# Patient Record
Sex: Male | Born: 1945
Health system: Southern US, Community
[De-identification: ages and names within clinical notes are randomized; demographics above are authoritative.]

## PROBLEM LIST (undated history)

## (undated) DIAGNOSIS — R972 Elevated prostate specific antigen [PSA]: Secondary | ICD-10-CM

## (undated) DIAGNOSIS — M1990 Unspecified osteoarthritis, unspecified site: Secondary | ICD-10-CM

## (undated) DIAGNOSIS — Z87442 Personal history of urinary calculi: Secondary | ICD-10-CM

## (undated) DIAGNOSIS — C61 Malignant neoplasm of prostate: Secondary | ICD-10-CM

## (undated) DIAGNOSIS — I10 Essential (primary) hypertension: Secondary | ICD-10-CM

## (undated) DIAGNOSIS — R9431 Abnormal electrocardiogram [ECG] [EKG]: Secondary | ICD-10-CM

## (undated) DIAGNOSIS — T50995A Adverse effect of other drugs, medicaments and biological substances, initial encounter: Secondary | ICD-10-CM

## (undated) DIAGNOSIS — R7303 Prediabetes: Secondary | ICD-10-CM

## (undated) DIAGNOSIS — K219 Gastro-esophageal reflux disease without esophagitis: Secondary | ICD-10-CM

## (undated) DIAGNOSIS — T7840XA Allergy, unspecified, initial encounter: Secondary | ICD-10-CM

## (undated) DIAGNOSIS — K5730 Diverticulosis of large intestine without perforation or abscess without bleeding: Secondary | ICD-10-CM

## (undated) DIAGNOSIS — R200 Anesthesia of skin: Secondary | ICD-10-CM

## (undated) DIAGNOSIS — N289 Disorder of kidney and ureter, unspecified: Secondary | ICD-10-CM

## (undated) DIAGNOSIS — C439 Malignant melanoma of skin, unspecified: Secondary | ICD-10-CM

## (undated) DIAGNOSIS — M25469 Effusion, unspecified knee: Secondary | ICD-10-CM

## (undated) DIAGNOSIS — H269 Unspecified cataract: Secondary | ICD-10-CM

## (undated) DIAGNOSIS — E785 Hyperlipidemia, unspecified: Secondary | ICD-10-CM

## (undated) DIAGNOSIS — Z77098 Contact with and (suspected) exposure to other hazardous, chiefly nonmedicinal, chemicals: Secondary | ICD-10-CM

## (undated) HISTORY — PX: VASECTOMY: SHX75

## (undated) HISTORY — DX: Malignant melanoma of skin, unspecified: C43.9

## (undated) HISTORY — DX: Unspecified osteoarthritis, unspecified site: M19.90

## (undated) HISTORY — DX: Hyperlipidemia, unspecified: E78.5

## (undated) HISTORY — DX: Contact with and (suspected) exposure to other hazardous, chiefly nonmedicinal, chemicals: Z77.098

## (undated) HISTORY — DX: Essential (primary) hypertension: I10

## (undated) HISTORY — DX: Elevated prostate specific antigen (PSA): R97.20

## (undated) HISTORY — PX: CATARACT EXTRACTION: SUR2

## (undated) HISTORY — PX: OTHER SURGICAL HISTORY: SHX169

## (undated) HISTORY — DX: Anesthesia of skin: R20.0

## (undated) HISTORY — DX: Disorder of kidney and ureter, unspecified: N28.9

## (undated) HISTORY — DX: Diverticulosis of large intestine without perforation or abscess without bleeding: K57.30

## (undated) HISTORY — DX: Allergy, unspecified, initial encounter: T78.40XA

## (undated) HISTORY — DX: Malignant neoplasm of prostate: C61

## (undated) HISTORY — PX: TONSILLECTOMY: SUR1361

## (undated) HISTORY — DX: Unspecified cataract: H26.9

## (undated) HISTORY — PX: CYSTECTOMY: SUR359

## (undated) HISTORY — PX: PROSTATECTOMY: SHX69

## (undated) HISTORY — DX: Effusion, unspecified knee: M25.469

## (undated) HISTORY — PX: BUNIONECTOMY: SHX129

## (undated) HISTORY — DX: Adverse effect of other drugs, medicaments and biological substances, initial encounter: T50.995A

## (undated) HISTORY — DX: Abnormal electrocardiogram (ECG) (EKG): R94.31

---

## 2001-08-10 ENCOUNTER — Ambulatory Visit (HOSPITAL_COMMUNITY): Admission: RE | Admit: 2001-08-10 | Discharge: 2001-08-10 | Payer: Self-pay | Admitting: Family Medicine

## 2001-08-10 ENCOUNTER — Encounter: Payer: Self-pay | Admitting: Family Medicine

## 2004-04-14 ENCOUNTER — Ambulatory Visit: Payer: Self-pay | Admitting: *Deleted

## 2004-04-22 ENCOUNTER — Ambulatory Visit: Payer: Self-pay | Admitting: *Deleted

## 2004-04-22 ENCOUNTER — Ambulatory Visit (HOSPITAL_COMMUNITY): Admission: RE | Admit: 2004-04-22 | Discharge: 2004-04-22 | Payer: Self-pay | Admitting: *Deleted

## 2004-05-03 ENCOUNTER — Ambulatory Visit: Payer: Self-pay | Admitting: *Deleted

## 2004-05-14 ENCOUNTER — Ambulatory Visit: Payer: Self-pay | Admitting: *Deleted

## 2004-06-07 ENCOUNTER — Ambulatory Visit: Payer: Self-pay | Admitting: *Deleted

## 2004-06-21 ENCOUNTER — Ambulatory Visit (HOSPITAL_COMMUNITY): Admission: RE | Admit: 2004-06-21 | Discharge: 2004-06-21 | Payer: Self-pay | Admitting: Internal Medicine

## 2004-06-21 ENCOUNTER — Ambulatory Visit: Payer: Self-pay | Admitting: Internal Medicine

## 2005-03-07 LAB — HM COLONOSCOPY: HM Colonoscopy: NORMAL

## 2005-09-29 ENCOUNTER — Ambulatory Visit: Payer: Self-pay | Admitting: Cardiology

## 2005-10-31 ENCOUNTER — Ambulatory Visit: Payer: Self-pay | Admitting: Cardiology

## 2006-03-14 ENCOUNTER — Ambulatory Visit: Payer: Self-pay | Admitting: Family Medicine

## 2006-04-25 ENCOUNTER — Ambulatory Visit: Payer: Self-pay | Admitting: Family Medicine

## 2006-04-25 LAB — CONVERTED CEMR LAB
ALT: 19 units/L (ref 0–40)
BUN: 11 mg/dL (ref 6–23)
Bilirubin, Direct: 0.1 mg/dL (ref 0.0–0.3)
CO2: 32 meq/L (ref 19–32)
Cholesterol: 149 mg/dL (ref 0–200)
Creatinine, Ser: 1.1 mg/dL (ref 0.4–1.5)
GFR calc Af Amer: 88 mL/min
GFR calc non Af Amer: 73 mL/min
Glucose, Bld: 102 mg/dL — ABNORMAL HIGH (ref 70–99)
HCT: 40.8 % (ref 39.0–52.0)
HDL: 42.6 mg/dL (ref >39.0)
Hemoglobin: 14 g/dL (ref 13.0–17.0)
LDL Cholesterol: 72 mg/dL (ref 0–99)
MCV: 86.6 fL (ref 78.0–100.0)
Monocytes Absolute: 0.5 10*3/uL (ref 0.2–0.7)
Neutro Abs: 3.2 10*3/uL (ref 1.4–7.7)
Neutrophils Relative %: 54.8 % (ref 43.0–77.0)
PSA: 3.71 ng/mL (ref 0.10–4.00)
Platelets: 219 10*3/uL (ref 150–400)
RBC: 4.71 M/uL (ref 4.22–5.81)
RDW: 13 % (ref 11.5–14.6)
Sodium: 144 meq/L (ref 135–145)
Total CHOL/HDL Ratio: 3.5
Triglycerides: 171 mg/dL — ABNORMAL HIGH (ref 0–149)
VLDL: 34 mg/dL (ref 0–40)

## 2006-05-02 ENCOUNTER — Ambulatory Visit: Payer: Self-pay | Admitting: Family Medicine

## 2006-09-05 ENCOUNTER — Ambulatory Visit: Payer: Self-pay | Admitting: Cardiology

## 2006-09-12 ENCOUNTER — Ambulatory Visit: Payer: Self-pay

## 2006-09-26 ENCOUNTER — Ambulatory Visit: Payer: Self-pay | Admitting: Family Medicine

## 2006-09-26 DIAGNOSIS — E785 Hyperlipidemia, unspecified: Secondary | ICD-10-CM | POA: Insufficient documentation

## 2006-09-26 DIAGNOSIS — T50995A Adverse effect of other drugs, medicaments and biological substances, initial encounter: Secondary | ICD-10-CM

## 2006-09-26 HISTORY — DX: Adverse effect of other drugs, medicaments and biological substances, initial encounter: T50.995A

## 2006-09-26 HISTORY — DX: Hyperlipidemia, unspecified: E78.5

## 2006-09-28 LAB — CONVERTED CEMR LAB
ALT: 20 units/L (ref 0–53)
AST: 21 units/L (ref 0–37)
Alkaline Phosphatase: 44 units/L (ref 39–117)
Bilirubin, Direct: 0.1 mg/dL (ref 0.0–0.3)
Cholesterol: 175 mg/dL (ref 0–200)
Total CHOL/HDL Ratio: 4.9

## 2007-03-19 ENCOUNTER — Encounter: Payer: Self-pay | Admitting: Family Medicine

## 2007-04-13 ENCOUNTER — Ambulatory Visit: Payer: Self-pay | Admitting: Family Medicine

## 2007-04-13 DIAGNOSIS — K5730 Diverticulosis of large intestine without perforation or abscess without bleeding: Secondary | ICD-10-CM

## 2007-04-13 DIAGNOSIS — I10 Essential (primary) hypertension: Secondary | ICD-10-CM

## 2007-04-13 DIAGNOSIS — M1990 Unspecified osteoarthritis, unspecified site: Secondary | ICD-10-CM

## 2007-04-13 HISTORY — DX: Essential (primary) hypertension: I10

## 2007-04-13 HISTORY — DX: Unspecified osteoarthritis, unspecified site: M19.90

## 2007-04-13 HISTORY — DX: Diverticulosis of large intestine without perforation or abscess without bleeding: K57.30

## 2007-04-14 ENCOUNTER — Telehealth: Payer: Self-pay | Admitting: Family Medicine

## 2007-04-14 DIAGNOSIS — R972 Elevated prostate specific antigen [PSA]: Secondary | ICD-10-CM | POA: Insufficient documentation

## 2007-04-14 HISTORY — DX: Elevated prostate specific antigen (PSA): R97.20

## 2007-04-14 LAB — CONVERTED CEMR LAB
Alkaline Phosphatase: 45 units/L (ref 39–117)
Bilirubin, Direct: 0.2 mg/dL (ref 0.0–0.3)
Cholesterol: 172 mg/dL (ref 0–200)
HDL: 39.1 mg/dL (ref >39.0)
PSA: 5.55 ng/mL — ABNORMAL HIGH (ref 0.10–4.00)
Total CHOL/HDL Ratio: 4.4
Total Protein: 7.2 g/dL (ref 6.0–8.3)
Triglycerides: 195 mg/dL — ABNORMAL HIGH (ref 0–149)
VLDL: 39 mg/dL (ref 0–40)

## 2007-04-18 ENCOUNTER — Telehealth (INDEPENDENT_AMBULATORY_CARE_PROVIDER_SITE_OTHER): Payer: Self-pay | Admitting: *Deleted

## 2007-04-24 ENCOUNTER — Encounter: Payer: Self-pay | Admitting: Family Medicine

## 2007-05-16 ENCOUNTER — Ambulatory Visit: Payer: Self-pay | Admitting: Family Medicine

## 2007-05-24 ENCOUNTER — Ambulatory Visit (HOSPITAL_COMMUNITY): Admission: RE | Admit: 2007-05-24 | Discharge: 2007-05-24 | Payer: Self-pay | Admitting: Urology

## 2007-05-29 ENCOUNTER — Encounter: Payer: Self-pay | Admitting: Family Medicine

## 2007-06-29 ENCOUNTER — Telehealth (INDEPENDENT_AMBULATORY_CARE_PROVIDER_SITE_OTHER): Payer: Self-pay | Admitting: *Deleted

## 2007-07-12 ENCOUNTER — Inpatient Hospital Stay (HOSPITAL_COMMUNITY): Admission: RE | Admit: 2007-07-12 | Discharge: 2007-07-13 | Payer: Self-pay | Admitting: Urology

## 2007-07-12 ENCOUNTER — Encounter: Payer: Self-pay | Admitting: Urology

## 2007-07-14 ENCOUNTER — Emergency Department (HOSPITAL_COMMUNITY): Admission: EM | Admit: 2007-07-14 | Discharge: 2007-07-15 | Payer: Self-pay | Admitting: Emergency Medicine

## 2007-08-09 ENCOUNTER — Encounter: Payer: Self-pay | Admitting: Family Medicine

## 2007-09-05 ENCOUNTER — Ambulatory Visit: Payer: Self-pay | Admitting: Cardiology

## 2007-10-24 ENCOUNTER — Ambulatory Visit: Payer: Self-pay | Admitting: Family Medicine

## 2007-10-26 LAB — CONVERTED CEMR LAB
ALT: 20 units/L (ref 0–53)
Cholesterol: 149 mg/dL (ref 0–200)
HDL: 35.8 mg/dL — ABNORMAL LOW (ref >39.0)
Total Bilirubin: 0.6 mg/dL (ref 0.3–1.2)
Total CHOL/HDL Ratio: 4.2
Triglycerides: 165 mg/dL — ABNORMAL HIGH (ref 0–149)
VLDL: 33 mg/dL (ref 0–40)

## 2007-10-31 ENCOUNTER — Ambulatory Visit: Payer: Self-pay | Admitting: Family Medicine

## 2007-11-23 ENCOUNTER — Encounter: Payer: Self-pay | Admitting: Family Medicine

## 2008-02-11 ENCOUNTER — Telehealth: Payer: Self-pay | Admitting: Family Medicine

## 2008-06-19 ENCOUNTER — Encounter: Payer: Self-pay | Admitting: Family Medicine

## 2008-07-08 ENCOUNTER — Ambulatory Visit: Payer: Self-pay | Admitting: Family Medicine

## 2008-07-08 LAB — CONVERTED CEMR LAB
Bilirubin Urine: NEGATIVE
Blood in Urine, dipstick: NEGATIVE
Ketones, urine, test strip: NEGATIVE
Protein, U semiquant: NEGATIVE
Urobilinogen, UA: 0.2

## 2008-07-09 ENCOUNTER — Encounter: Payer: Self-pay | Admitting: Family Medicine

## 2008-07-09 LAB — CONVERTED CEMR LAB
AST: 22 units/L (ref 0–37)
Basophils Absolute: 0.1 10*3/uL (ref 0.0–0.1)
Basophils Relative: 0.7 % (ref 0.0–3.0)
CO2: 27 meq/L (ref 19–32)
Calcium: 9.3 mg/dL (ref 8.4–10.5)
Cholesterol: 131 mg/dL (ref 0–200)
Creatinine, Ser: 1.2 mg/dL (ref 0.4–1.5)
GFR calc non Af Amer: 65.49 mL/min (ref >60)
HDL: 36.2 mg/dL — ABNORMAL LOW (ref >39.00)
LDL Cholesterol: 60 mg/dL (ref 0–99)
Lymphs Abs: 2 10*3/uL (ref 0.7–4.0)
MCV: 88.7 fL (ref 78.0–100.0)
Neutro Abs: 5.6 10*3/uL (ref 1.4–7.7)
Neutrophils Relative %: 66.9 % (ref 43.0–77.0)
PSA: 0.04 ng/mL — ABNORMAL LOW (ref 0.10–4.00)
RBC: 4.51 M/uL (ref 4.22–5.81)
Sodium: 144 meq/L (ref 135–145)
TSH: 2 microintl units/mL (ref 0.35–5.50)
Total Protein: 6.9 g/dL (ref 6.0–8.3)
Triglycerides: 172 mg/dL — ABNORMAL HIGH (ref 0.0–149.0)
VLDL: 34.4 mg/dL (ref 0.0–40.0)
WBC: 8.4 10*3/uL (ref 4.5–10.5)

## 2008-07-15 ENCOUNTER — Ambulatory Visit: Payer: Self-pay | Admitting: Family Medicine

## 2008-07-29 ENCOUNTER — Encounter (INDEPENDENT_AMBULATORY_CARE_PROVIDER_SITE_OTHER): Payer: Self-pay | Admitting: *Deleted

## 2008-10-07 ENCOUNTER — Ambulatory Visit: Payer: Self-pay | Admitting: Cardiology

## 2008-10-07 DIAGNOSIS — R9431 Abnormal electrocardiogram [ECG] [EKG]: Secondary | ICD-10-CM

## 2008-10-07 HISTORY — DX: Abnormal electrocardiogram (ECG) (EKG): R94.31

## 2009-02-11 ENCOUNTER — Ambulatory Visit: Payer: Self-pay | Admitting: Family Medicine

## 2009-02-17 LAB — CONVERTED CEMR LAB
ALT: 17 units/L (ref 0–53)
HDL: 38 mg/dL — ABNORMAL LOW (ref >39.00)
Total Bilirubin: 0.8 mg/dL (ref 0.3–1.2)
Total CHOL/HDL Ratio: 4
Total Protein: 6.8 g/dL (ref 6.0–8.3)
VLDL: 27.4 mg/dL (ref 0.0–40.0)

## 2009-02-18 ENCOUNTER — Ambulatory Visit: Payer: Self-pay | Admitting: Family Medicine

## 2009-06-15 ENCOUNTER — Ambulatory Visit: Payer: Self-pay | Admitting: Family Medicine

## 2009-06-15 DIAGNOSIS — M25469 Effusion, unspecified knee: Secondary | ICD-10-CM

## 2009-06-15 HISTORY — DX: Effusion, unspecified knee: M25.469

## 2009-07-01 ENCOUNTER — Encounter: Payer: Self-pay | Admitting: Family Medicine

## 2009-07-07 ENCOUNTER — Ambulatory Visit: Admission: RE | Admit: 2009-07-07 | Discharge: 2009-09-16 | Payer: Self-pay | Admitting: Radiation Oncology

## 2009-07-08 ENCOUNTER — Encounter: Payer: Self-pay | Admitting: Family Medicine

## 2009-08-05 ENCOUNTER — Encounter: Payer: Self-pay | Admitting: Family Medicine

## 2009-09-16 ENCOUNTER — Encounter: Payer: Self-pay | Admitting: Family Medicine

## 2009-09-28 ENCOUNTER — Ambulatory Visit: Payer: Self-pay | Admitting: Family Medicine

## 2009-09-28 LAB — CONVERTED CEMR LAB
Bilirubin Urine: NEGATIVE
Glucose, Urine, Semiquant: NEGATIVE
Ketones, urine, test strip: NEGATIVE
Specific Gravity, Urine: 1.025
WBC Urine, dipstick: NEGATIVE
pH: 5

## 2009-09-30 LAB — CONVERTED CEMR LAB
BUN: 19 mg/dL (ref 6–23)
Basophils Absolute: 0 10*3/uL (ref 0.0–0.1)
Cholesterol: 163 mg/dL (ref 0–200)
Creatinine, Ser: 1.3 mg/dL (ref 0.4–1.5)
Eosinophils Absolute: 0.1 10*3/uL (ref 0.0–0.7)
Eosinophils Relative: 1.4 % (ref 0.0–5.0)
GFR calc non Af Amer: 61.09 mL/min (ref >60)
HCT: 40.5 % (ref 39.0–52.0)
HDL: 47.1 mg/dL (ref >39.00)
LDL Cholesterol: 81 mg/dL (ref 0–99)
Monocytes Absolute: 0.5 10*3/uL (ref 0.1–1.0)
Monocytes Relative: 9.3 % (ref 3.0–12.0)
Neutro Abs: 3.7 10*3/uL (ref 1.4–7.7)
Platelets: 168 10*3/uL (ref 150.0–400.0)
TSH: 2.48 microintl units/mL (ref 0.35–5.50)
Total Bilirubin: 0.6 mg/dL (ref 0.3–1.2)
Total Protein: 6.7 g/dL (ref 6.0–8.3)
Triglycerides: 176 mg/dL — ABNORMAL HIGH (ref 0.0–149.0)

## 2009-10-05 ENCOUNTER — Ambulatory Visit: Payer: Self-pay | Admitting: Family Medicine

## 2009-10-05 DIAGNOSIS — I2510 Atherosclerotic heart disease of native coronary artery without angina pectoris: Secondary | ICD-10-CM | POA: Insufficient documentation

## 2009-10-05 DIAGNOSIS — C61 Malignant neoplasm of prostate: Secondary | ICD-10-CM | POA: Insufficient documentation

## 2009-10-05 HISTORY — DX: Malignant neoplasm of prostate: C61

## 2009-10-06 ENCOUNTER — Telehealth: Payer: Self-pay | Admitting: Family Medicine

## 2009-10-20 ENCOUNTER — Encounter: Payer: Self-pay | Admitting: Family Medicine

## 2009-12-28 ENCOUNTER — Encounter: Payer: Self-pay | Admitting: Family Medicine

## 2010-03-07 HISTORY — PX: MELANOMA EXCISION: SHX5266

## 2010-03-15 ENCOUNTER — Encounter: Payer: Self-pay | Admitting: Family Medicine

## 2010-04-01 ENCOUNTER — Other Ambulatory Visit: Payer: Self-pay | Admitting: Family Medicine

## 2010-04-01 ENCOUNTER — Ambulatory Visit
Admission: RE | Admit: 2010-04-01 | Discharge: 2010-04-01 | Payer: Self-pay | Source: Home / Self Care | Attending: Family Medicine | Admitting: Family Medicine

## 2010-04-01 LAB — HEPATIC FUNCTION PANEL
AST: 21 U/L (ref 0–37)
Albumin: 4.4 g/dL (ref 3.5–5.2)
Alkaline Phosphatase: 53 U/L (ref 39–117)
Bilirubin, Direct: 0.1 mg/dL (ref 0.0–0.3)
Total Bilirubin: 0.8 mg/dL (ref 0.3–1.2)

## 2010-04-01 LAB — LIPID PANEL
HDL: 41.2 mg/dL (ref >39.00)
Total CHOL/HDL Ratio: 4
VLDL: 52 mg/dL — ABNORMAL HIGH (ref 0.0–40.0)

## 2010-04-04 LAB — CONVERTED CEMR LAB
BUN: 15 mg/dL (ref 6–23)
CO2: 28 meq/L (ref 19–32)
Calcium: 9.5 mg/dL (ref 8.4–10.5)

## 2010-04-07 ENCOUNTER — Encounter: Payer: Self-pay | Admitting: Family Medicine

## 2010-04-07 ENCOUNTER — Ambulatory Visit (INDEPENDENT_AMBULATORY_CARE_PROVIDER_SITE_OTHER): Payer: 59 | Admitting: Family Medicine

## 2010-04-07 ENCOUNTER — Ambulatory Visit: Admit: 2010-04-07 | Payer: Self-pay | Admitting: Family Medicine

## 2010-04-07 DIAGNOSIS — E785 Hyperlipidemia, unspecified: Secondary | ICD-10-CM

## 2010-04-07 DIAGNOSIS — I10 Essential (primary) hypertension: Secondary | ICD-10-CM

## 2010-04-07 DIAGNOSIS — N529 Male erectile dysfunction, unspecified: Secondary | ICD-10-CM

## 2010-04-07 MED ORDER — TADALAFIL 20 MG PO TABS: 20.0000 mg | ORAL_TABLET | Freq: Every day | ORAL | Status: DC | PRN

## 2010-04-07 MED ORDER — METOPROLOL SUCCINATE ER 50 MG PO TB24: 50.0000 mg | ORAL_TABLET | Freq: Every day | ORAL | Status: DC

## 2010-04-07 MED ORDER — LISINOPRIL-HYDROCHLOROTHIAZIDE 20-12.5 MG PO TABS: 1.0000 | ORAL_TABLET | Freq: Every day | ORAL | Status: DC

## 2010-04-07 MED ORDER — ROSUVASTATIN CALCIUM 10 MG PO TABS: 10.0000 mg | ORAL_TABLET | Freq: Every day | ORAL | Status: DC

## 2010-04-07 NOTE — Progress Notes (Signed)
Here for follow up. He feels fine and has no complaints. He recently had 2 melanomas removed from his back by his dermatologist, Dr. Margo Aye in [place]. It sounds like these were superficial spreading type fortunately. He is getting some exercise.

## 2010-04-07 NOTE — Progress Notes (Signed)
  Subjective:    Patient ID: [name] [name], male    DOB: Jul 17, 1945, 65 y.o.   MRN: [id number]  Hypertension  Other  Here for follow up. He feels fine and has no complaints. He recently had 2 melanomas removed from his back by his dermatologist, Dr. Margo Aye in [place]. It sounds like these were superficial spreading type fortunately. He is getting some exercise.     Review of Systems  Constitutional: Negative.   Respiratory: Negative.   Cardiovascular: Negative.        Objective:   Physical Exam  Constitutional: He appears well-developed and well-nourished.  Neck: Normal range of motion. Neck supple. No thyromegaly present.  Cardiovascular: Normal rate, regular rhythm, normal heart sounds and intact distal pulses.   Pulmonary/Chest: Breath sounds normal.  Abdominal: Soft. Bowel sounds are normal.          Assessment & Plan:  He is doing well. Exercise and lose weight. Recheck in 6 months

## 2010-04-08 NOTE — Assessment & Plan Note (Signed)
Summary: PAIN R KNEE/CB   Vital Signs:  Patient profile:   65 year old male Weight:      196 pounds BMI:     32.24 Temp:     98.3 degrees F oral BP sitting:   136 / 82  (left arm) Cuff size:   regular  Vitals Entered By: Raechel Ache, RN (June 15, 2009 9:24 AM) CC: C/o R knee aching and fluid on it.   History of Present Illness: Here for some painless swelling on the front of his right knee which came up last week. Just before this he had spent some time on his knees replacing some floor moldings. he has used ice and taken some Motrin. Now it doesn't really bother him at all.   Allergies: No Known Drug Allergies  Past History:  Past Medical History: Reviewed history from 09/27/2008 and no changes required. HYPERTENSION (ICD-401.9) HYPERLIPIDEMIA (ICD-272.4) PROSTATE CANCER, HX OF (ICD-V10.46) ELEVATED PROSTATE SPECIFIC ANTIGEN (ICD-790.93) GOUT (ICD-274.9) WELL ADULT EXAM (ICD-V70.0) DEGENERATIVE JOINT DISEASE, MODERATE (ICD-715.90) FAMILY HISTORY DIABETES 1ST DEGREE RELATIVE (ICD-V18.0) DIVERTICULOSIS, COLON (ICD-562.10) ADVEF, DRUG/MED/BIOL SUBST, OTHER DRUG NOS (JXB-147.82)     Past Surgical History: Reviewed history from 02/18/2009 and no changes required. Vasectomy Benign cyst removed from lt hand Tonsillectomy colonoscopy 2007, normal, repeat in 10 years Prostatectomy bunionectomy bilateral feet  2010 per Dr. Pricilla Holm in [place]  Review of Systems  The patient denies anorexia, fever, weight loss, weight gain, vision loss, decreased hearing, hoarseness, chest pain, syncope, dyspnea on exertion, peripheral edema, prolonged cough, headaches, hemoptysis, abdominal pain, melena, hematochezia, severe indigestion/heartburn, hematuria, incontinence, genital sores, muscle weakness, suspicious skin lesions, transient blindness, difficulty walking, depression, unusual weight change, abnormal bleeding, enlarged lymph nodes, angioedema, breast masses, and testicular  masses.    Physical Exam  General:  Well-developed,well-nourished,in no acute distress; alert,appropriate and cooperative throughout examination Msk:  the right knee has a small nontender effusion of the prepatellar bursa. No warmth or erythema. Full ROM.    Impression & Recommendations:  Problem # 1:  JOINT EFFUSION, KNEE (ICD-719.06)  Complete Medication List: 1)  Toprol Xl 50 Mg Tb24 (Metoprolol succinate) .Marland Kitchen.. 1 by mouth once daily 2)  Lisinopril-hydrochlorothiazide 20-12.5 Mg Tabs (Lisinopril-hydrochlorothiazide) .Marland Kitchen.. 1 by mouth once daily 3)  Crestor 10 Mg Tabs (Rosuvastatin calcium) .... Take 1 tab by mouth daily 4)  Aspir-low 81 Mg Tbec (Aspirin) .Marland Kitchen.. 1 once daily after meal 5)  Travatan Z 0.004 % Soln (Travoprost) .... 2 eye drops once daily 6)  Azopt 1 % Susp (Brinzolamide) .... 2 drops each eye two times a day  Patient Instructions: 1)  reassured that this should resolve on its own over the next few weeks. Avoid spending prolonged periods on his knees.  2)  Please schedule a follow-up appointment as needed .

## 2010-04-08 NOTE — Assessment & Plan Note (Signed)
Summary: cpx/njr   Vital Signs:  Patient profile:   65 year old male Height:      65.5 inches Weight:      201 pounds Temp:     98.3 degrees F oral Pulse rate:   63 / minute BP sitting:   138 / 82  (left arm) Cuff size:   regular  Vitals Entered By: Kathrynn Speed CMA (October 05, 2009 10:15 AM) CC: cpx with labs, src   History of Present Illness: 65 yr old male for a cpx. He feels fine and has no concerns. He has completed all courses of radiation treatment for his prostate cancer, and he tolerated this quite well.   Current Medications (verified): 1)  Toprol Xl 50 Mg Tb24 (Metoprolol Succinate) .Marland Kitchen.. 1 By Mouth Once Daily 2)  Lisinopril-Hydrochlorothiazide 20-12.5 Mg Tabs (Lisinopril-Hydrochlorothiazide) .Marland Kitchen.. 1 By Mouth Once Daily 3)  Crestor 10 Mg Tabs (Rosuvastatin Calcium) .... Take 1 Tab By Mouth Daily 4)  Aspir-Low 81 Mg Tbec (Aspirin) .Marland Kitchen.. 1 Once Daily After Meal 5)  Lumigan 0.01 % Soln (Bimatoprost) .... One Drop in Both Eyes Daily 6)  Alphagan P 0.1 % Soln (Brimonidine Tartrate) .... One Drop Both Eyes Twice Daily  Allergies (verified): No Known Drug Allergies  Past History:  Past Medical History: HYPERTENSION (ICD-401.9) HYPERLIPIDEMIA (ICD-272.4) GOUT (ICD-274.9) DEGENERATIVE JOINT DISEASE, MODERATE (ICD-715.90) DIVERTICULOSIS, COLON (ICD-562.10) prostate cancer, sees Dr. Gaspar Garbe (urology) and Dr. Antony Blackbird (radiation oncology), getting IMRT  glaucoma, sees Dr. Mayford Knife in [place] hyperglycemia  Past Surgical History: Vasectomy Benign cyst removed from lt hand Tonsillectomy colonoscopy 2007, normal, repeat in 10 years Prostatectomy per Dr. Wilson Singer bunionectomy bilateral feet  2010 per Dr. Pricilla Holm in [place]  Family History: Reviewed history from 04/13/2007 and no changes required. Family History Diabetes 1st degree relative Family History of Stroke M 1st degree relative <50  Social History: Reviewed history from 04/13/2007 and no changes  required. Married Former Smoker Alcohol use-yes Drug use-no  Review of Systems  The patient denies anorexia, fever, weight loss, weight gain, vision loss, decreased hearing, hoarseness, chest pain, syncope, dyspnea on exertion, peripheral edema, prolonged cough, headaches, hemoptysis, abdominal pain, melena, hematochezia, severe indigestion/heartburn, hematuria, incontinence, genital sores, muscle weakness, suspicious skin lesions, transient blindness, difficulty walking, depression, unusual weight change, abnormal bleeding, enlarged lymph nodes, angioedema, breast masses, and testicular masses.    Physical Exam  General:  overweight-appearing.   Head:  Normocephalic and atraumatic without obvious abnormalities. No apparent alopecia or balding. Eyes:  No corneal or conjunctival inflammation noted. EOMI. Perrla. Funduscopic exam benign, without hemorrhages, exudates or papilledema. Vision grossly normal. Ears:  External ear exam shows no significant lesions or deformities.  Otoscopic examination reveals clear canals, tympanic membranes are intact bilaterally without bulging, retraction, inflammation or discharge. Hearing is grossly normal bilaterally. Nose:  External nasal examination shows no deformity or inflammation. Nasal mucosa are pink and moist without lesions or exudates. Mouth:  Oral mucosa and oropharynx without lesions or exudates.  Teeth in good repair. Neck:  No deformities, masses, or tenderness noted. Chest Wall:  No deformities, masses, tenderness or gynecomastia noted. Lungs:  Normal respiratory effort, chest expands symmetrically. Lungs are clear to auscultation, no crackles or wheezes. Heart:  Normal rate and regular rhythm. S1 and S2 normal without gallop, murmur, click, rub or other extra sounds. EKG normal Abdomen:  Bowel sounds positive,abdomen soft and non-tender without masses, organomegaly or hernias noted. Msk:  No deformity or scoliosis noted of thoracic or lumbar  spine.  Pulses:  R and L carotid,radial,femoral,dorsalis pedis and posterior tibial pulses are full and equal bilaterally Extremities:  No clubbing, cyanosis, edema, or deformity noted with normal full range of motion of all joints.   Neurologic:  No cranial nerve deficits noted. Station and gait are normal. Plantar reflexes are down-going bilaterally. DTRs are symmetrical throughout. Sensory, motor and coordinative functions appear intact. Skin:  Intact without suspicious lesions or rashes Cervical Nodes:  No lymphadenopathy noted Axillary Nodes:  No palpable lymphadenopathy Inguinal Nodes:  No significant adenopathy Psych:  Cognition and judgment appear intact. Alert and cooperative with normal attention span and concentration. No apparent delusions, illusions, hallucinations   Impression & Recommendations:  Problem # 1:  WELL ADULT EXAM (ICD-V70.0)  Orders: EKG w/ Interpretation (93000)  Complete Medication List: 1)  Toprol Xl 50 Mg Tb24 (Metoprolol succinate) .Marland Kitchen.. 1 by mouth once daily 2)  Lisinopril-hydrochlorothiazide 20-12.5 Mg Tabs (Lisinopril-hydrochlorothiazide) .Marland Kitchen.. 1 by mouth once daily 3)  Crestor 10 Mg Tabs (Rosuvastatin calcium) .... Take 1 tab by mouth daily 4)  Aspir-low 81 Mg Tbec (Aspirin) .Marland Kitchen.. 1 once daily after meal 5)  Lumigan 0.01 % Soln (Bimatoprost) .... One drop in both eyes daily 6)  Alphagan P 0.1 % Soln (Brimonidine tartrate) .... One drop both eyes twice daily 7)  Cialis 20 Mg Tabs (Tadalafil) .... As needed  Patient Instructions: 1)  Please schedule a follow-up appointment in 6 months .  2)  It is important that you exercise reguarly at least 20 minutes 5 times a week. If you develop chest pain, have severe difficulty breathing, or feel very tired, stop exercising immediately and seek medical attention.  3)  You need to lose weight. Consider a lower calorie diet and regular exercise.  4)  get an A1c in 6 months

## 2010-04-08 NOTE — Letter (Signed)
[facility]   Imported By: Maryln Gottron 07/30/2009 13:01:00  _____________________________________________________________________  External Attachment:    Type:   Image     Comment:   External Document

## 2010-04-08 NOTE — Letter (Signed)
Summary: [facility]  [facility]   Imported By: Maryln Gottron 01/05/2010 14:16:56  _____________________________________________________________________  External Attachment:    Type:   Image     Comment:   External Document

## 2010-04-08 NOTE — Progress Notes (Signed)
Summary: REQ FOR LABWORK  Phone Note Call from Patient   Caller: Patient Summary of Call: Pt called to make f/u appt with Dr Clent Ridges... Pt to have labwork prior to that 6 mth appt.... I adv pt that he is to have A1C done prior to his f/u appt and pt adv that he also has liver /  lipid panel, 272.4.Marland KitchenMarland KitchenMarland KitchenMarland Kitchen   Can you advise order for same?  Initial call taken by: Debbra Riding,  October 06, 2009 8:05 AM  Follow-up for Phone Call        this sounds good thanks Follow-up by: Nelwyn Salisbury MD,  October 07, 2009 9:39 AM  Additional Follow-up for Phone Call Additional follow up Details #1::        Done.  Additional Follow-up by: Debbra Riding,  October 07, 2009 2:08 PM

## 2010-04-08 NOTE — Letter (Signed)
[facility]   Imported By: Maryln Gottron 10/20/2009 09:12:08  _____________________________________________________________________  External Attachment:    Type:   Image     Comment:   External Document

## 2010-04-08 NOTE — Letter (Signed)
Summary: [facility]  [facility]   Imported By: Maryln Gottron 11/23/2009 09:59:52  _____________________________________________________________________  External Attachment:    Type:   Image     Comment:   External Document

## 2010-04-08 NOTE — Letter (Signed)
[facility]   Imported By: Maryln Gottron 08/26/2009 13:51:09  _____________________________________________________________________  External Attachment:    Type:   Image     Comment:   External Document

## 2010-04-08 NOTE — Letter (Signed)
Summary: [facility]  [facility]   Imported By: Maryln Gottron 07/06/2009 11:13:52  _____________________________________________________________________  External Attachment:    Type:   Image     Comment:   External Document

## 2010-04-21 ENCOUNTER — Telehealth: Payer: Self-pay | Admitting: *Deleted

## 2010-04-21 NOTE — Telephone Encounter (Signed)
Pt was taking Toprol Sussinate 50 mg one po daily and [facility] to Toprol Tartrate 50 mg. Bid and wants to know if this is too much.

## 2010-04-22 ENCOUNTER — Telehealth: Payer: Self-pay | Admitting: Family Medicine

## 2010-04-22 NOTE — Telephone Encounter (Signed)
Triage vm -----On metoprolol succ er 50 mg 1qd. [facility]oprolol tartrate 50 mg bid. Please advise of what to do?

## 2010-04-23 NOTE — Telephone Encounter (Signed)
We took care of this already.

## 2010-04-23 NOTE — Telephone Encounter (Signed)
No, a short acting med twice a day is about the same as a long acting med once a day

## 2010-04-23 NOTE — Telephone Encounter (Signed)
Pt informed

## 2010-04-29 ENCOUNTER — Ambulatory Visit: Payer: 59 | Attending: Radiation Oncology | Admitting: Radiation Oncology

## 2010-07-20 NOTE — Assessment & Plan Note (Signed)
[facility]                            CARDIOLOGY OFFICE NOTE   NAME:Biscoe, CAMDEN KNOTEK                       MRN:          [id number]  DATE:09/05/2006                            DOB:          [birth year]    Mr. Alverio returns today for further management of the following issues:  1. Coronary artery disease, with silent inferior wall infarct in the      past.  This was found on a stress Myoview a couple years ago.  He      is asymptomatic.  2. Hypertension.  We added lisinopril/HCTZ last visit which really      improved his blood pressure.  3. Hyperlipidemia.  At goal on Crestor.  Blood work obtained February      2008.   He is having no problems at the present time.  He denies any exertional  angina or ischemic symptoms.  He remains quite active working for [employer].   MEDICATIONS:  1. Toprol-XL 50 mg daily.  2. Lisinopril/HCTZ 20/12.5 daily.  3. Travatan eye drops.  4. Aspirin 81 mg daily.  5. Crestor 10 mg daily.   PHYSICAL EXAMINATION:  VITAL SIGNS:  Blood pressure 120/70, pulse 59,  and he is in sinus bradycardia.  There are nonspecific ST changes, which  is unchanged from before.  There may be a bit more accentuated ST  segment depression or nonspecific changes in the precordial leads.  His  weight is 200.  SKIN:  Warm and dry, and extremely tanned.  HEENT:  Normocephalic, atraumatic.  PERRLA.  Extraocular movements  intact.  Sclerae clear.  Facial symmetry is normal.  NECK:  Carotids are full.  There is no JVD.  Thyroid is not enlarged.  Trachea is midline.  LUNGS:  Clear.  HEART:  Reveals a regular rate and rhythm.  Soft S1, S2.  PMI is hard to  appreciate.  ABDOMEN:  Soft.  Good bowel sounds.  EXTREMITIES:  With no cyanosis, clubbing, or edema.  Pulses are present.  NEUROLOGIC:  Intact.   ASSESSMENT AND PLAN:  Mr. Baba is doing well.  I have arranged for the  following:  1. Exercise rest/stress Myoview off the Toprol to detect  any occult      obstructive disease.  He has had a previous silent MI in the past.  2. Continue current medications.  3. I made him aware of a PSA which was 3.71.  I have asked him to      follow up with Dr. Clent Ridges      concerning this, whom he has an office visit with next month.  4. Otherwise, I will see him back in a year.     Thomas C. Daleen Squibb, MD, [facility]  Electronically Signed    TCW/MedQ  DD: 09/05/2006  DT: 09/06/2006  Job #: [id number]   cc:   Jeannett Senior A. Clent Ridges, MD

## 2010-07-20 NOTE — Op Note (Signed)
NAME:  RONDALL, RADIGAN NO.:  [id number]   MEDICAL RECORD NO.:  [id number]          PATIENT TYPE:  INP   LOCATION:  1427                         FACILITY:  [facility]   PHYSICIAN:  Excell Seltzer. Annabell Howells, M.D.    DATE OF BIRTH:  [birth year]   DATE OF PROCEDURE:  07/12/2007  DATE OF DISCHARGE:  07/12/2007                               OPERATIVE REPORT   PREOPERATIVE DIAGNOSIS:  Clinically localized adenocarcinoma of the  prostate.   POSTOPERATIVE DIAGNOSIS:  Clinically localized adenocarcinoma of the  prostate.   OPERATION/PROCEDURE:  1. Robotic assisted laparoscopic prostatectomy.  2. Bilateral pelvic lymphadenectomy.   SURGEON:  Excell Seltzer. Annabell Howells, M.D.   ASSISTANT:  Duane Boston, M.D.   ANESTHESIA:  General.   SPECIMENS:  Prostate, seminal vesicle and distal vas deferens.   ESTIMATED BLOOD LOSS:  100 mL.   DRAINS:  Jackson-Pratt and 18-French.   INDICATIONS FOR PROCEDURE:  Please see the history of physical for full  details.  Briefly this is a 65 year old male with clinical T2a,  pretreatment PSA 5.5, Gleason 4+3=7 adenocarcinoma of prostate.  He  presents for prostatectomy.   DESCRIPTION OF PROCEDURE:  The patient is brought to the operating room  He was identified by his armband.  Informed consent was verified and  preoperative time-out was performed.  After satisfactory induction of  general anesthesia, the patient was moved to the dorsal lithotomy  position.  His abdomen and genitalia and thighs were prepped and draped  in usual fashion.  He was placed in steep Trendelenburg.  We then  proceeded to make a periumbilical incision.  This was carried down to  the fascia under direct vision.  It was incised sharply.  We gained  access to the peritoneum.  We placed a 12 trocar.  We then inserted the  camera and insufflated the abdomen to create a pneumoperitoneum.  We  then placed robotic ports in standard fashion under direct vision after  injecting with lidocaine.   Three additional robotic ports were placed as  well as a 12 mm assistant port and 5 mm assistant port.  These were all  placed atraumatically under direct vision.  We then turned to the  bladder.  The medial umbilical ligaments were identified and cautery was  used to drop the bladder and enter the retroperitoneum.  This was  bluntly dissected away.  We identified the pubis.  Fat was cleaned off  the anterior surface of prostate.  We then identified the endopelvic  fascia.  This was incised sharply.  We developed planes to the prostatic  capsule on the levators on the left.  This was done bluntly with  occasional bipolar cautery.  We then incised the puboprostatic ligament  and identified the groove between the urethra in the dorsal vein.  We  proceeded to do the identical incision in the endopelvic fascia,  separation of the levators.  Once we had an adequate groove, the dorsal  vein was taken with a stapler.  We then identified the bladder neck with  Foley traction.  This was incised with cautery.  The  bladder neck was  incised until the Foley catheter was identified.  The balloon was  strapped and the Foley was used as a retractor,  placed in the prostate  gland in the hammock.  We then developed a plane around the posterior  bladder neck with cautery.  We took great care not to buttonhole the  bladder.  Once we were around the corner of the posterior bladder neck,  we turned our dissection posterior and identified the structures.  We  identified the bilateral vas deferens in the midline.  The left was  grasped and freed up until we had adequate length.  It was then divided  with cautery and used as a bucket-handle retractor.  Similarly the right  vas was cleaned, isolated and divided.  We then delivered the seminal  vesicles.  Great attention was taken at the seminal vesicles to ensure  hemostasis.  Once posterior structures had been fully mobilized and  dissected, we developed the  plane along the Denonvilliers fascia and the  posterior plane.  Great care was taken to identify where the plane above  the perirectal fat.  Once this was done, we proceeded to a left-sided  nerve spare.  We performed an antegrade nerve spare on the left,  dropping the nerve off the lateral side with a layer of capsule.  This  was done without Bovie cautery to ensure that there is no nerve damage.  It was done with clips.  We did a wide excision of the right-sided  pedicle.  We then clipped the left inferior vesical pedicle.  This left  the urethra.  We divided the urethra sharply along the margin of the  rectourethral muscle.  Once this had been completely divided, the  prostate was freed from all attachments and placed in the right lower  quadrant.  The pelvis was irrigated.  The rectal tube was pumped and  there was no free air noted.  We then proceeded to lymphadenectomy.   We performed a bilateral pelvic lymphadenectomy.  The limits of  dissection with the external iliac vein and artery laterally, the iliac  muscle medially, the obturator nerve posteriorly.  This was carried from  the bifurcation of the iliacs down toward the node of [place].  This was  performed on both sides.  We used clips to avoid lymphocele.  These  specimens were delivered separately.  We then inspected our __________  for hemostasis.  It was excellent at this point.  Then proceeded to  anastomosis.  We performed a running watertight anastomosis with a  double-armed pretied Monocryl in usual fashion.  This was done after a  holding stitch was placed with 2-0 Vicryl approximating the bladder neck  and urethra along with a sheet of Denonvilliers fascia.  We tested our  anastomosis and it was watertight.  We then placed the Jackson-Pratt  drain in the pelvis.  We then undocked the robot.  The 12 mm system port  was closed with suture passer.  The prostate was then removed in the  EndoCatch specimen bag.  We then  closed the extraction site with a  running 0 Vicryl.  Ports were injected with lidocaine.  All seven skin  sites were closed with surgical staples.  At this time the procedure was  terminated.  The patient tolerated procedure well.  There were no  complications.   Bjorn Pippin was the attending primary responsible physician, was present  participated in the procedure.   DISPOSITION:  To  the post anesthesia care unit.  Of the Post Anesthesia  Care Unit.  And      Duane Boston, MD      Excell Seltzer. Annabell Howells, M.D.  Electronically Signed    BP/MEDQ  D:  07/12/2007  T:  07/12/2007  Job:  [id number]

## 2010-07-20 NOTE — H&P (Signed)
NAME:  MAREK, NGHIEM NO.:  [id number]   MEDICAL RECORD NO.:  [id number]          PATIENT TYPE:  INP   LOCATION:  0003                         FACILITY:  [facility]   PHYSICIAN:  Excell Seltzer. Annabell Howells, M.D.    DATE OF BIRTH:  Sep 08, 1945   DATE OF ADMISSION:  07/12/2007  DATE OF DISCHARGE:                              HISTORY & PHYSICAL   CHIEF COMPLAINT:  Here for surgery.   HISTORY OF PRESENT ILLNESS:  Mr. Confer is a 65 year old male with  erectile dysfunction and minimal lower urinary tract symptoms who was  found to have adenocarcinoma of the prostate.  This was investigated  because of a clinical stage T2A nodule as well as a PSA of 5.22. He had  Gleason 4+3 adenocarcinoma in 15% of 1/12 cores at the right base.  Prostate volume was 940 mL.  He was counseled on the risks and benefits  of his management options and has elected surgery and presents for that  today.   REVIEW OF SYSTEMS:  Multisystem was performed and was negative for all  symptoms except as in the HPI.   PAST MEDICAL HISTORY:  1. Glaucoma.  2. Gout.  3. Hypercholesterolemia.  4. Hypertension.  5. Nephrolithiasis.   SURGICAL HISTORY:  1. Ureteroscopy.  2. Hand surgery.  3. Nose surgery.  4. Vasectomy.   MEDICATIONS:  1. Allopurinol  2. Aspirin.  3. Azopt.  4. Cialis.  5. Crestor.  6. Levitra.  7. Lisinopril.  8. Hydrochlorothiazide.  9. Toprol.  10.Travatan Z.   ALLERGIES:  None.   SOCIAL HISTORY:  The patient has erectile dysfunction.  He uses  occasional alcohol use.  He is abstinent from tobacco x34 years.   PHYSICAL EXAM:  VITAL SIGNS:  Afebrile with stable vitals.  GENERAL:  No acute distress.  HEAD:  Normocephalic and atraumatic.  CHEST:  Clear to auscultation.  CARDIAC:  Regular rhythm.  ABDOMEN:  Soft, nontender, nondistended.  GU:  Circumcised phallus.  Testes are descended bilaterally.  Normal  contour.   ASSESSMENT:  Clinically localized adenocarcinoma of the  prostate.   PLAN:  The patient will be taken to the operating room for robotic  assisted laparoscopic prostatectomy with bilateral pelvic  lymphadenectomy.  Due to the burden of disease, he was counseled for a  unilateral nerve sparing with a wide excision on the right where his  disease is focused.     ______________________________  Melina Schools, MD      Excell Seltzer. Annabell Howells, M.D.  Electronically Signed    JR/MEDQ  D:  07/12/2007  T:  07/12/2007  Job:  [id number]

## 2010-07-20 NOTE — Assessment & Plan Note (Signed)
[facility]                            CARDIOLOGY OFFICE NOTE   NAME:[name] [name], [name] [name]                       MRN:          [id number]  DATE:09/05/2007                            DOB:          04/11/1945    Mr. Shaneyfelt comes in today for further management of his probable coronary  artery disease.   Please refer to my previous notes.   Based on a previous Myoview at [facility] several years ago, he  was felt to have an inferior wall scar.  We repeated stress Myoview on  September 12, 2006 here, and he went 9 minutes and 30 seconds.  His peak heart  was 153 beats per minute, which is 94% of predicted maximum heart rate.  MET level achieved was 10.9.  He is in excellent physical shape.  His EF  was 60%.  He had no significant ischemia, and no sign of infarction!  I  have explained this to him today.  Perhaps, he had some diaphragmatic  attenuation to explain the basis of his previous abnormal study.   He is having his blood work followed by Dr. Shellia Carwin.  He has  unfortunately had to undergo laparoscopic prostatectomy for prostate CA  by Dr. Annabell Howells, which he is now doing very well with.   MEDICATIONS:  1. Crestor 10 mg a day.  2. Enteric-coated aspirin 81 mg a day.  3. Toprol-XL 50 mg a day.  4. Lisinopril/HCTZ 20/12.5 daily.  5. Allopurinol 100 mg a day.  6. He is taking Cialis 5 mg every other day for his prostate issues.   PHYSICAL EXAMINATION:  VITAL SIGNS:  His blood pressure today is 137/81  and his pulse is 57 and regular.  His EKG shows sinus brady with  nonspecific ST segment changes.  There is no sign of infarction.  Weight  is 196, down 4.  He has an incredibly good sun tan.  He is spending a  lot of time at [address]  He likes to scuba dive.  HEENT:  Otherwise unchanged.  NECK:  Carotid upstrokes are equal bilaterally without bruits.  No JVD.  Thyroid is not enlarged.  Trachea is midline.  LUNGS:  Clear.  HEART:  Nondisplaced PMI.   Normal S1 and S2.  ABDOMEN:  Soft.  Good bowel sounds.  No obvious midline bruit.  EXTREMITIES:  No cyanosis, clubbing, or edema.  Pulses are intact.  NEUROLOGIC:  Intact.   PLAN:  I was delighted to tell Mr. Haigler that I do not think he has had  a previous infarct.  He probably has some nonobstructive coronary artery  disease based on his risk factors.  I have  reinforced the importance with risk reduction, taking his statin,  aspirin, beta-blocker, and lisinopril/HCTZ.  At this time, he is doing  well.  I will see him back in a year.     Thomas C. Daleen Squibb, MD, [facility]  Electronically Signed    TCW/MedQ  DD: 09/05/2007  DT: 09/06/2007  Job #: [id number]

## 2010-07-23 NOTE — Op Note (Signed)
NAME:  [name] [name], [name] [name]                ACCOUNT NO.:  [id number]   MEDICAL RECORD NO.:  [id number]          PATIENT TYPE:  AMB   LOCATION:  DAY                           FACILITY:  [facility]   PHYSICIAN:  R. Roetta Sessions, M.D. DATE OF BIRTH:  1945/06/08   DATE OF PROCEDURE:  06/21/2004  DATE OF DISCHARGE:                                 OPERATIVE REPORT   PROCEDURE:  Screening colonoscopy.   INDICATION FOR PROCEDURE:  The patient is a 66 year old gentleman who was  referred out of courtesy of Angus G. Renard Matter, M.D. for colon cancer  screening.  He had a sigmoidoscopy some five years ago without significant  findings.  No family history of colorectal neoplasia.  He has no GI  symptoms.  Colonoscopy is now being done as a screening maneuver.  This  approach has been discussed at length, potential risks, benefits, and  alternatives have been reviewed, questions answered, he is agreeable.  Please see documentation in the medical record.   PROCEDURE NOTE:  O2 saturation, blood pressure, pulse, and respiration were  monitored throughout the entire procedure.   CONSCIOUS SEDATION:  Versed 4 mg IV, Demerol 100 mg IV in divided doses.   INSTRUMENT USED:  Olympus video chip system.   FINDINGS:  Digital rectal exam revealed no abnormalities.  Endoscopic  findings:  Prep was good.   Rectum:  Examination of the rectal mucosa including a retroflexed view of  the anal verge revealed only internal hemorrhoids.   Colon:  The colonic mucosa was surveyed from the rectosigmoid junction  through the left, transverse and right colon to the appendiceal orifice,  ileocecal valve and cecum.  These structures were well-seen and photographed  for the record.  From this level the scope was slowly withdrawn and all  previously-mentioned mucosal surfaces were again seen.  The patient was  noted to have internal hemorrhoids and left-side diverticula.  The remainder  of the rectum and colonic mucosa appeared normal.   The patient tolerated the  procedure well and was reacted in endoscopy.   IMPRESSION:  1.  Internal hemorrhoids, otherwise normal rectum.  2.  Left-sided diverticula.  The remainder of the colonic mucosa appeared      normal.   RECOMMENDATIONS:  Repeat colonoscopy in 10 years.      RMR/MEDQ  D:  06/21/2004  T:  06/21/2004  Job:  [id number]   cc:   Angus G. Renard Matter, MD  [address]  [place]  [place] [address]  Fax: [phone]

## 2010-07-23 NOTE — Procedures (Signed)
NAME:  [name] [name], DAILY NO.:  [id number]   MEDICAL RECORD NO.:  [id number]         PATIENT TYPE:  REC   LOCATION:                                FACILITY:  [facility]   PHYSICIAN:  Vida Roller, M.D.   DATE OF BIRTH:  [birth year]   DATE OF PROCEDURE:  DATE OF DISCHARGE:                                    STRESS TEST   PROCEDURE:  Exercise Myoview.   HISTORY:  Mr. Marietta is a 65 year old gentleman with minimal cardiac risk  factors, who is asymptomatic and has an abnormal electrocardiogram.   BASELINE DATA:  EKG reveals sinus bradycardia at 60 beats per minute with  some nonspecific ST abnormalities.  The blood pressure is 134/88.   The patient exercised for a total of 9 minutes and 38 seconds on the Bruce  protocol, stage 4, to 10.1 mets.  The maximal heart rate was 157, which is  96% of predicted maximum.  The maximum blood pressure was 210/82 and  resolved down to 140/78 in recovery.  EKG revealed no ischemic changes.  No  arrhythmias were noted.  The patient denied any chest discomfort or  shortness of breath.  Exercise was thought secondary to fatigue.   Final images and results are pending M.D. review.      AB/MEDQ  D:  04/22/2004  T:  04/22/2004  Job:  [id number]

## 2010-07-23 NOTE — Assessment & Plan Note (Signed)
[facility] OFFICE NOTE   NAME:[name], [name] MOONEY                       MRN:          [id number]  DATE:05/02/2006                            DOB:          1945/08/09    This is a 65 year old gentleman here for a complete physical  examination.  Generally, he is doing well and has no complaints.  He had  a [employer] physical done for his job on March 15, 2006.  He brings with him some lab results from that as well as an EKG.  We reviewed the EKG today and it is within normal limits.  He was  recently diagnosed with glaucoma by Dr. Duanne [name]., who is his  ophthalmologist.  He was started on eyedrops for that.  Otherwise,  erectile dysfunction is doing well.  Last month I gave him some samples  of Cialis to try for that and he was pleased.  For further details of  his past medical history, family history, social history, habits, etc.,  refer to our introductory note with him dated March 14, 2006.   ALLERGIES:  None.   CURRENT MEDICATIONS:  1. Lisinopril/hydrochlorothiazide 20/12.5 once a day.  2. Toprol-XL 50 mg per day.  3. Crestor 10 mg per day.  4. Travatan eyedrops daily.  5. Cialis 20 mg as needed.   OBJECTIVE:  VITAL SIGNS:  Height 5 feet 7 inches, weight 193, BP 122/78,  pulse 76 and regular.  GENERAL:  He appears to be healthy.  Skin is clear.  HEENT:  Eyes are clear, ears clear, pharynx clear.  NECK:  Supple without lymphadenopathy or masses.  LUNGS:  Clear.  CARDIAC:  Rate and rhythm regular without gallops, murmurs or rubs.  Distal pulses are full.  ABDOMEN:  Soft, normal bowel sounds, nontender, no masses.  GENITALIA:  Normal male.  He is circumcised.  RECTAL:  No masses or tenderness.  Stool hemoccult negative.  Prostate  is within normal limits.  EXTREMITIES:  No clubbing, cyanosis, or edema.  NEUROLOGIC:  Grossly intact.   The patient had fasting laboratories in our  lab as well on February 19.  These were all within normal limits with the exception of his lipid  panel.  HDL was good at 42, LDL was excellent at 72, but triglycerides  were a bit elevated at 171 (he says they were over 300 before beginning  Crestor).   ASSESSMENT AND PLAN:  1. Complete physical.  I encouraged him to get regular exercise.  2. Hyperlipidemia.  Will continue with our current measures.  3. Hypertension, stable.  4. Erectile dysfunction, stable.  5. Glaucoma.  He will follow up with Dr. Mayford [name].     [name] [name]. [name] Ridges, MD  Electronically Signed    SAF/MedQ  DD: 05/03/2006  DT: 05/03/2006  Job #: [id number]

## 2010-07-23 NOTE — Assessment & Plan Note (Signed)
 [facility]                              CARDIOLOGY OFFICE NOTE   NAME:Koran, GIANG HEMME                       MRN:          [id number]  DATE:10/31/2005                            DOB:          1945/09/08    Mr. Mizrahi returns today for further followup of his blood pressure.   We added lisinopril/HCTZ 20/12.5 on September 29, 2005.   He has bought a monitor and has checked numerous pressures.  He is running  consistently in the 120s/70 range.   He feels well.   I have made no changes.  We will see him back in July 2008 for an exercise  stress test.                               Maisie Fus C. Daleen Squibb, MD, [facility]    TCW/MedQ  DD:  10/31/2005  DT:  11/01/2005  Job #:  [id number]   cc:   Angus G. Renard Matter, MD

## 2010-07-23 NOTE — Assessment & Plan Note (Signed)
[facility]                              CARDIOLOGY OFFICE NOTE   NAME:Plaza, JOVANN LUSE                       MRN:          [id number]  DATE:09/29/2005                            DOB:          [birth year]   Ms. Hardcastle returns today for further management following issues.  1.  Presumed coronary artery disease with inferior wall defect, normal      systolic function, EF 62% by stress Cardiolite April 23, 2004.  He      has excellent exercise tolerance.  He is clearly asymptomatic.  2.  Hyperlipidemia at goal last year on Crestor.  His is followed by Dr.      Renard Matter who checked blood work, I am told, in January.  3.  Probable hypertension.   He feels well.  He gets his blood pressure checked at work but it is running  about 140 to 150/about 80.   MEDICATIONS:  1.  Toprol XL 50 mg a day.  2.  Aspirin 81 mg a day.  3.  Crestor 10 mg a day.   PHYSICAL EXAMINATION:  VITAL SIGNS:  His blood pressure today is 170/90,  pulse is 58 and regular, weight is 202.  He is 5 feet 7 inches.  HEENT:  Unremarkable.  NECK:  Carotid upstrokes are equal bilaterally without bruits.  There is no  JVD.  Thyroid is not enlarged.  Trachea is midline.  LUNGS:  Clear.  HEART:  Regular rate and rhythm without murmurs, rubs, or gallops.  ABDOMEN:  Protuberant with good bowel sounds.  There is no hepatomegaly.  EXTREMITIES:  No clubbing, cyanosis, or edema.  Pulses are brisk.   EKG shows sinus bradycardia, otherwise normal.   PLAN:  1.  Prescriptions renewed.  2.  Add Lisinopril/hydrochlorothiazide 20/12.5.  He will check blood      pressures for [place] over the next several weeks.  We will have him follow      up in three weeks to see [place] as well as check a chemistry.   He will return otherwise in a year, at which time he will need a stress  Myoview.                               Thomas C. Daleen Squibb, MD, [facility]   TCW/MedQ  DD:  09/29/2005  DT:  09/29/2005  Job #:  [id number]   cc:   Angus G. Renard Matter, MD

## 2010-07-23 NOTE — Assessment & Plan Note (Signed)
[facility] OFFICE NOTE   NAME:[name] [name], [name] [name]                       MRN:          [id number]  DATE:03/14/2006                            DOB:          [birth year]    This is a 65 year old gentleman, here to establish with our practice.  He is doing well, but has one concern.  He has been using Viagra for  some erectile problems.  It is not working very well and he would like  to try something else.   PAST MEDICAL HISTORY:  He had seen Dr. Butch Penny in [place] for  his primary care until Dr. Renard Matter retired.  He is now transferring his  primary care to [place].  He established with Dr. Juanito Doom for help with his  hypertension several months ago.  Apparently, his hypertension has done  well.  He also has a history of high cholesterol.  He says he tried a  number of agents, including Lipitor and Zocor, which were ineffective.  Once he got on Crestor about three years ago, his lipids have been doing  quite well.  He does follow his diet fairly closely.  His last complete  physical was with Dr. Renard Matter on February 16 of last year.  He does  bring a copy of his lab work from that visit today.  He has had a  vasectomy.  He had a benign cyst removed from the palm of his left hand  by Dr. Vear Clock in 1990.  He has had a tonsillectomy.  He had a screening  colonoscopy in 2007.  This was normal, except for some diverticulosis.  It was recommended that he have this repeated in ten years.   ALLERGIES:  None.   CURRENT MEDICATIONS:  1. Lisinopril/HCT 20/12.5 once a day.  2. Toprol XL 50 mg once a day.  3. Crestor 10 mg once a day.  4. Viagra 100 mg as needed.   HABITS:  He quit smoking many years ago.  He does drink some alcohol.   SOCIAL HISTORY:  He is married with children.  He is an Field se[employer]ologist.   FAMILY HISTORY:  Remarkable for diabetes and strokes.   OBJECTIVE:  Height 5 feet 7 inches, weight 199, BP  132/78, pulse 76 and  regular.  In general, he appears to be healthy.  I did not do a detailed  examination today.   We did review some lab work that he brought with him, dated April 22, 2005.  This was all within normal limits, except for his lipid panel.  LDL was excellent at 71, triglycerides were slightly high at 163 (but  apparently this was down from 300s and 400s a few years ago).   ASSESSMENT AND PLAN:  1. Hypertension:  Stable.  I refilled his medication as above.  2. Hyperlipidemia:  I refilled Crestor as above.  It seems to be      working well.  3. Health maintenance:  He will set up for a complete physical with me      in the next  month or      two.  4. Erectile dysfunction:  I gave him samples to try Cialis 20 mg as      needed.     Tera Mater. Clent Ridges, MD  Electronically Signed    SAF/MedQ  DD: 03/15/2006  DT: 03/15/2006  Job #: [id number]

## 2010-09-09 ENCOUNTER — Telehealth: Payer: Self-pay | Admitting: *Deleted

## 2010-09-09 NOTE — Telephone Encounter (Signed)
Pt concerned about low BPs lately.  Appt scheduled with  Dr. Clent Ridges.

## 2010-09-10 ENCOUNTER — Encounter: Payer: Self-pay | Admitting: Family Medicine

## 2010-09-10 ENCOUNTER — Ambulatory Visit (INDEPENDENT_AMBULATORY_CARE_PROVIDER_SITE_OTHER): Payer: 59 | Admitting: Family Medicine

## 2010-09-10 VITALS — BP 102/74 | HR 60 | Temp 98.3°F | Wt 200.0 lb

## 2010-09-10 DIAGNOSIS — I10 Essential (primary) hypertension: Secondary | ICD-10-CM

## 2010-09-10 DIAGNOSIS — M25569 Pain in unspecified knee: Secondary | ICD-10-CM

## 2010-09-10 NOTE — Progress Notes (Signed)
  Subjective:    Patient ID: [name] [name], male    DOB: March 18, 1945, 65 y.o.   MRN: [id number]  HPI Here for swings in his BP and continued swelling and pain in the right knee. The knee has been bothering him for about 2 months and will not get better. No hx of trauma. His BP has swung as high as 150 systolic and then as low as 100 systolic, and he gets lightheaded at times. No HA or SOB.   Review of Systems  Constitutional: Negative.   Musculoskeletal: Positive for joint swelling and arthralgias.  Neurological: Positive for light-headedness.       Objective:   Physical Exam  Constitutional: He appears well-developed and well-nourished.  Cardiovascular: Normal rate, regular rhythm, normal heart sounds and intact distal pulses.  Exam reveals no gallop and no friction rub.   No murmur heard. Pulmonary/Chest: Effort normal and breath sounds normal.  Musculoskeletal:       The right knee is mildly swollen and tender along both joint spaces. Full ROM          Assessment & Plan:  We will decrease the dose of Metoprolol to 1/2 tablet bid. We will follow up in 4 weeks at his cpx here. Refer to Orthopedics.

## 2010-09-19 ENCOUNTER — Encounter: Payer: Self-pay | Admitting: Family Medicine

## 2010-09-30 ENCOUNTER — Other Ambulatory Visit (INDEPENDENT_AMBULATORY_CARE_PROVIDER_SITE_OTHER): Payer: 59

## 2010-09-30 DIAGNOSIS — Z0000 Encounter for general adult medical examination without abnormal findings: Secondary | ICD-10-CM

## 2010-09-30 LAB — BASIC METABOLIC PANEL
CO2: 27 mEq/L (ref 19–32)
Chloride: 102 mEq/L (ref 96–112)
Creatinine, Ser: 1.3 mg/dL (ref 0.4–1.5)
Potassium: 4.7 mEq/L (ref 3.5–5.1)
Sodium: 138 mEq/L (ref 135–145)

## 2010-09-30 LAB — POCT URINALYSIS DIPSTICK
Leukocytes, UA: NEGATIVE
Nitrite, UA: NEGATIVE
Protein, UA: NEGATIVE
Spec Grav, UA: 1.025
Urobilinogen, UA: 0.2
pH, UA: 5

## 2010-09-30 LAB — PSA: PSA: 0.01 ng/mL — ABNORMAL LOW (ref 0.10–4.00)

## 2010-09-30 LAB — LIPID PANEL
Cholesterol: 171 mg/dL (ref 0–200)
HDL: 53.5 mg/dL (ref >39.00)
LDL Cholesterol: 86 mg/dL (ref 0–99)
Triglycerides: 156 mg/dL — ABNORMAL HIGH (ref 0.0–149.0)
VLDL: 31.2 mg/dL (ref 0.0–40.0)

## 2010-09-30 LAB — HEPATIC FUNCTION PANEL
ALT: 19 U/L (ref 0–53)
Albumin: 4.8 g/dL (ref 3.5–5.2)
Bilirubin, Direct: 0.1 mg/dL (ref 0.0–0.3)
Total Protein: 7.2 g/dL (ref 6.0–8.3)

## 2010-09-30 LAB — CBC WITH DIFFERENTIAL/PLATELET
Basophils Absolute: 0 10*3/uL (ref 0.0–0.1)
Lymphocytes Relative: 25.3 % (ref 12.0–46.0)
MCV: 88.7 fl (ref 78.0–100.0)
Monocytes Relative: 8.9 % (ref 3.0–12.0)
Neutro Abs: 4.1 10*3/uL (ref 1.4–7.7)
Neutrophils Relative %: 63 % (ref 43.0–77.0)

## 2010-09-30 LAB — TSH: TSH: 2.43 u[IU]/mL (ref 0.35–5.50)

## 2010-10-07 ENCOUNTER — Encounter: Payer: Self-pay | Admitting: Family Medicine

## 2010-10-07 ENCOUNTER — Ambulatory Visit (INDEPENDENT_AMBULATORY_CARE_PROVIDER_SITE_OTHER): Payer: 59 | Admitting: Family Medicine

## 2010-10-07 VITALS — BP 116/78 | HR 68 | Temp 98.6°F | Ht 66.5 in | Wt 202.0 lb

## 2010-10-07 DIAGNOSIS — Z0000 Encounter for general adult medical examination without abnormal findings: Secondary | ICD-10-CM

## 2010-10-07 MED ORDER — METOPROLOL TARTRATE 50 MG PO TABS: 25.0000 mg | ORAL_TABLET | Freq: Two times a day (BID) | ORAL | Status: DC

## 2010-10-07 NOTE — Progress Notes (Signed)
  Subjective:    Patient ID: [name] [name], male    DOB: 10-Oct-1945, 65 y.o.   MRN: [id number]  HPI 64 yr old male for a cpx. He feels well and has no complaints.    Review of Systems  Constitutional: Negative.   HENT: Negative.   Eyes: Negative.   Respiratory: Negative.   Cardiovascular: Negative.   Gastrointestinal: Negative.   Genitourinary: Negative.   Musculoskeletal: Negative.   Skin: Negative.   Neurological: Negative.   Hematological: Negative.   Psychiatric/Behavioral: Negative.        Objective:   Physical Exam  Constitutional: He is oriented to person, place, and time. He appears well-developed and well-nourished. No distress.  HENT:  Head: Normocephalic and atraumatic.  Right Ear: External ear normal.  Left Ear: External ear normal.  Nose: Nose normal.  Mouth/Throat: Oropharynx is clear and moist. No oropharyngeal exudate.  Eyes: Conjunctivae and EOM are normal. Pupils are equal, round, and reactive to light. Right eye exhibits no discharge. Left eye exhibits no discharge. No scleral icterus.  Neck: Neck supple. No JVD present. No tracheal deviation present. No thyromegaly present.  Cardiovascular: Normal rate, regular rhythm, normal heart sounds and intact distal pulses.  Exam reveals no gallop and no friction rub.   No murmur heard.      EKG normal  Pulmonary/Chest: Effort normal and breath sounds normal. No respiratory distress. He has no wheezes. He has no rales. He exhibits no tenderness.  Abdominal: Soft. Bowel sounds are normal. He exhibits no distension and no mass. There is no tenderness. There is no rebound and no guarding.  Genitourinary: Rectum normal, prostate normal and penis normal. Guaiac negative stool. No penile tenderness.  Musculoskeletal: Normal range of motion. He exhibits no edema and no tenderness.  Lymphadenopathy:    He has no cervical adenopathy.  Neurological: He is alert and oriented to person, place, and time. He has normal  reflexes. No cranial nerve deficit. He exhibits normal muscle tone. Coordination normal.  Skin: Skin is warm and dry. No rash noted. He is not diaphoretic. No erythema. No pallor.  Psychiatric: He has a normal mood and affect. His behavior is normal. Judgment and thought content normal.          Assessment & Plan:  Well exam.

## 2010-10-18 ENCOUNTER — Telehealth: Payer: Self-pay | Admitting: Family Medicine

## 2010-10-18 DIAGNOSIS — E785 Hyperlipidemia, unspecified: Secondary | ICD-10-CM

## 2010-10-18 NOTE — Telephone Encounter (Signed)
pls advise

## 2010-10-18 NOTE — Telephone Encounter (Signed)
This would be a lipid panel and a hepatic panel for 272.4

## 2010-10-18 NOTE — Telephone Encounter (Signed)
Pt called concerning his 6 month follow up. Will pt need blood work the week before? If so which labs?

## 2010-10-20 NOTE — Telephone Encounter (Signed)
Called pt to make aware of labs needed.

## 2010-10-20 NOTE — Telephone Encounter (Signed)
Pt called back to schedule an appt for lab work.

## 2010-11-30 LAB — BASIC METABOLIC PANEL
BUN: 16
Chloride: 105
Glucose, Bld: 147 — ABNORMAL HIGH
Potassium: 3.3 — ABNORMAL LOW

## 2010-11-30 LAB — CBC
HCT: 39.1
MCV: 87.2
Platelets: 188
RDW: 14.1

## 2011-03-08 HISTORY — PX: KNEE ARTHROSCOPY: SHX127

## 2011-04-07 ENCOUNTER — Other Ambulatory Visit (INDEPENDENT_AMBULATORY_CARE_PROVIDER_SITE_OTHER): Payer: 59

## 2011-04-07 DIAGNOSIS — E785 Hyperlipidemia, unspecified: Secondary | ICD-10-CM

## 2011-04-07 LAB — HEPATIC FUNCTION PANEL
ALT: 20 U/L (ref 0–53)
Alkaline Phosphatase: 57 U/L (ref 39–117)
Bilirubin, Direct: 0 mg/dL (ref 0.0–0.3)
Total Protein: 7.5 g/dL (ref 6.0–8.3)

## 2011-04-12 MED ORDER — FENOFIBRATE 160 MG PO TABS: 160.0000 mg | ORAL_TABLET | Freq: Every day | ORAL | Status: DC

## 2011-04-12 NOTE — Progress Notes (Signed)
Quick Note:  Spoke with pt and sent script e-scribe. ______ 

## 2011-04-14 ENCOUNTER — Encounter: Payer: Self-pay | Admitting: Family Medicine

## 2011-04-14 ENCOUNTER — Ambulatory Visit (INDEPENDENT_AMBULATORY_CARE_PROVIDER_SITE_OTHER): Payer: 59 | Admitting: Family Medicine

## 2011-04-14 VITALS — BP 118/80 | HR 72 | Temp 98.5°F | Wt 203.0 lb

## 2011-04-14 DIAGNOSIS — I10 Essential (primary) hypertension: Secondary | ICD-10-CM

## 2011-04-14 DIAGNOSIS — E785 Hyperlipidemia, unspecified: Secondary | ICD-10-CM

## 2011-04-14 DIAGNOSIS — Z23 Encounter for immunization: Secondary | ICD-10-CM

## 2011-04-14 NOTE — Progress Notes (Signed)
Addended by: Aniceto Boss A on: 04/14/2011 10:40 AM   Modules accepted: Orders

## 2011-04-14 NOTE — Progress Notes (Signed)
  Subjective:    Patient ID: DEMARRI ELIE, male    DOB: 08-May-1945, 66 y.o.   MRN: [id number]  HPI Here for a 6 month follow up on lipids and HTN. He feels fine except for some stiffness in the right knee after a recent arthroscopy. His recent labs showed his TG to be high again, so we added Fenofibrate to his Crestor. He is watching his diet but has not been able to exercise much due to knee pain. His BP is stable.    Review of Systems  Constitutional: Negative.   Respiratory: Negative.   Cardiovascular: Negative.   Musculoskeletal: Positive for arthralgias.       Objective:   Physical Exam  Constitutional: He appears well-developed and well-nourished.  Cardiovascular: Normal rate, regular rhythm, normal heart sounds and intact distal pulses.   Pulmonary/Chest: Effort normal and breath sounds normal.          Assessment & Plan:  HTN is stable. Recheck labs in 90 days

## 2011-06-28 ENCOUNTER — Telehealth: Payer: Self-pay | Admitting: Family Medicine

## 2011-06-28 MED ORDER — FENOFIBRATE 160 MG PO TABS: 160.0000 mg | ORAL_TABLET | Freq: Every day | ORAL | Status: DC

## 2011-06-28 NOTE — Telephone Encounter (Signed)
Pt requested a 90 day supply of Fenofibrate 160 mg and send to [facility]. I sent script e-scribe.

## 2011-07-12 ENCOUNTER — Other Ambulatory Visit (INDEPENDENT_AMBULATORY_CARE_PROVIDER_SITE_OTHER): Payer: 59

## 2011-07-12 ENCOUNTER — Encounter: Payer: Self-pay | Admitting: Family Medicine

## 2011-07-12 DIAGNOSIS — E785 Hyperlipidemia, unspecified: Secondary | ICD-10-CM

## 2011-07-12 LAB — BASIC METABOLIC PANEL
BUN: 25 mg/dL — ABNORMAL HIGH (ref 6–23)
Calcium: 9.2 mg/dL (ref 8.4–10.5)
Creatinine, Ser: 1.6 mg/dL — ABNORMAL HIGH (ref 0.4–1.5)
GFR: 45.22 mL/min — ABNORMAL LOW (ref >60.00)
Glucose, Bld: 97 mg/dL (ref 70–99)
Potassium: 4 mEq/L (ref 3.5–5.1)

## 2011-07-15 NOTE — Progress Notes (Signed)
Quick Note:  I spoke with pt ______ 

## 2011-07-20 ENCOUNTER — Telehealth: Payer: Self-pay | Admitting: Family Medicine

## 2011-07-20 NOTE — Telephone Encounter (Signed)
I spoke with pt and he recently had labs done and it was supposed to be lipid pane. Please call lab and check into this.

## 2011-07-20 NOTE — Telephone Encounter (Signed)
Pt had questions about blood results from his last lab visit Pt requesting to be called back, pt is going out of town tomorrow and is requesting to be contact on his cell number if he is not called back today [phone]

## 2011-07-21 NOTE — Telephone Encounter (Signed)
I spoke with pt and he just had some labs done elsewhere and will bring [place] a copy in.

## 2011-07-28 ENCOUNTER — Encounter: Payer: Self-pay | Admitting: Family Medicine

## 2011-07-28 ENCOUNTER — Ambulatory Visit (INDEPENDENT_AMBULATORY_CARE_PROVIDER_SITE_OTHER): Payer: 59 | Admitting: Family Medicine

## 2011-07-28 VITALS — BP 118/80 | HR 60 | Temp 98.4°F | Wt 194.0 lb

## 2011-07-28 DIAGNOSIS — I2510 Atherosclerotic heart disease of native coronary artery without angina pectoris: Secondary | ICD-10-CM

## 2011-07-28 DIAGNOSIS — I10 Essential (primary) hypertension: Secondary | ICD-10-CM

## 2011-07-28 DIAGNOSIS — N289 Disorder of kidney and ureter, unspecified: Secondary | ICD-10-CM

## 2011-07-28 NOTE — Progress Notes (Signed)
  Subjective:    Patient ID: [name] [name], male    DOB: Aug 02, 1945, 66 y.o.   MRN: [id number]  HPI Here to discuss recent labs. He feels fine and has recently started a diet plan with his wife. He has lost 14 lbs in 2 weeks. He had  BMET here a few weeks ago which showed some early renal insufficiency. His creatinine has edged up to 1.6 and his GFR has decreased to 47. He drinks very little water. He had labs recently at the [facility] clinic, and his TG have come down to 187 (we started him on fenofibrate a few months ago).    Review of Systems  Constitutional: Negative.   Respiratory: Negative.   Cardiovascular: Negative.        Objective:   Physical Exam  Constitutional: He appears well-developed and well-nourished.  Cardiovascular: Normal rate, regular rhythm, normal heart sounds and intact distal pulses.   Pulmonary/Chest: Effort normal and breath sounds normal.          Assessment & Plan:  He seems to be doing well. I congratulated him on his weight loss. He will drink more water daily, and we will recheck this with his cpx labs when he returns in August.

## 2011-08-02 ENCOUNTER — Ambulatory Visit (INDEPENDENT_AMBULATORY_CARE_PROVIDER_SITE_OTHER): Payer: 59 | Admitting: Surgery

## 2011-08-11 ENCOUNTER — Encounter (INDEPENDENT_AMBULATORY_CARE_PROVIDER_SITE_OTHER): Payer: Self-pay | Admitting: Surgery

## 2011-08-11 ENCOUNTER — Ambulatory Visit (INDEPENDENT_AMBULATORY_CARE_PROVIDER_SITE_OTHER): Payer: 59 | Admitting: Surgery

## 2011-08-11 VITALS — BP 128/68 | HR 60 | Temp 98.0°F | Resp 18 | Ht 66.0 in | Wt 188.0 lb

## 2011-08-11 DIAGNOSIS — K429 Umbilical hernia without obstruction or gangrene: Secondary | ICD-10-CM

## 2011-08-11 DIAGNOSIS — K432 Incisional hernia without obstruction or gangrene: Secondary | ICD-10-CM | POA: Insufficient documentation

## 2011-08-11 NOTE — Patient Instructions (Signed)
[facility]  UMBILICAL OR INGUINAL HERNIA REPAIR POST OP INSTRUCTIONS  Always review your discharge instruction sheet given to you by the facility where your surgery was performed.  1. A  prescription for pain medication may be given to you upon discharge.  Take your pain medication as prescribed, if needed.  If narcotic pain medicine is not needed, then you may take acetaminophen (Tylenol) or ibuprofen (Advil) as needed. 2. Take your usually prescribed medications unless otherwise directed. 3. If you need a refill on your pain medication, please contact your pharmacy.  They will contact our office to request authorization. Prescriptions will not be filled after 5 pm daily or on weekends. 4. You should follow a light diet the first 24 hours after arrival home, such as soup and crackers, etc.  Be sure to include lots of fluids daily.  Resume your normal diet the day after surgery. 5. Most patients will experience some swelling and bruising around the umbilicus or in the groin and scrotum.  Ice packs and reclining will help.  Swelling and bruising can take several days to resolve.  6. It is common to experience some constipation if taking pain medication after surgery.  Increasing fluid intake and taking a stool softener (such as Colace) will usually help or prevent this problem from occurring.  A mild laxative (Milk of Magnesia or Miralax) should be taken according to package directions if there are no bowel movements after 48 hours. 7. Unless discharge instructions indicate otherwise, you may remove your bandages 24-48 hours after surgery, and you may shower at that time.  You may have steri-strips (small skin tapes) in place directly over the incision.  These strips should be left on the skin for 7-10 days.  If your surgeon used skin glue on the incision, you may shower in 24 hours.  The glue will flake off over the next 2-3 weeks.  Any sutures or staples will be removed at the office  during your follow-up visit. 8. ACTIVITIES:  You may resume regular (light) daily activities beginning the next day--such as daily self-care, walking, climbing stairs--gradually increasing activities as tolerated.  You may have sexual intercourse when it is comfortable.  Refrain from any heavy lifting or straining until approved by your doctor.  You may drive when you are no longer taking prescription pain medication, you can comfortably wear a seatbelt, and you can safely maneuver your car and apply brakes. 9. You should see your doctor in the office for a follow-up appointment approximately 2-3 weeks after your surgery.  Make sure that you call for this appointment within a day or two after you arrive home to insure a convenient appointment time.  WHEN TO CALL YOUR DOCTOR: 1. Fever over 101.0 2. Inability to urinate 3. Nausea and/or vomiting 4. Extreme swelling or bruising 5. Continued bleeding from incision. 6. Increased pain, redness, or drainage from the incision The clinic staff is available to answer your questions during regular business hours.  Please don't hesitate to call and ask to speak to one of the nurses for clinical concerns.  If you have a medical emergency, go to the nearest emergency room or call 911.  A surgeon from [facility] is always on call at the hospital.   [address], [address], [place], [place]  [address]  [address] [address], [place], [place]   [address] [phone] ? [phone] ? FAX [phone] Website: [email or web address]   

## 2011-08-11 NOTE — Progress Notes (Signed)
Chief Complaint  Patient presents with  . New Evaluation    umbilical hernia - referral from Dr. Bjorn Pippin    HISTORY: Patient is a 66 year old white male referred by his urologist for evaluation of umbilical hernia. Patient first noted a bulge at the umbilicus approximately 2 months ago. This occurred while straining. It has gradually increased in size. It causes minor discomfort. It has always been reducible.  Patient underwent robotic prostatectomy in 2009 for prostate cancer. A port site was located adjacent to the umbilicus. Patient denies any other signs of inguinal hernia. Patient denies any other prior abdominal surgery.  Past Medical History  Diagnosis Date  . HYPERLIPIDEMIA 09/26/2006  . GOUT 05/16/2007  . HYPERTENSION 04/13/2007  . CORONARY ARTERY DISEASE 10/05/2009  . DIVERTICULOSIS, COLON 04/13/2007  . DEGENERATIVE JOINT DISEASE, MODERATE 04/13/2007  . JOINT EFFUSION, KNEE 06/15/2009  . ELEVATED PROSTATE SPECIFIC ANTIGEN 04/14/2007  . ABNORMAL EKG 10/07/2008  . ADVEF, DRUG/MED/BIOL SUBST, OTHER DRUG NOS 09/26/2006  . PROSTATE CANCER 10/05/2009  . Melanoma   . Glaucoma      Current Outpatient Prescriptions  Medication Sig Dispense Refill  . Multiple Vitamins-Minerals (MULTIVITAMIN WITH MINERALS) tablet Take 1 tablet by mouth daily.      Marland Kitchen aspirin 81 MG tablet Take 81 mg by mouth daily.        . bimatoprost (LUMIGAN) 0.03 % ophthalmic drops Place 1 drop into both eyes at bedtime.        . brimonidine (ALPHAGAN P) 0.1 % SOLN Place 1 drop into both eyes 2 (two) times daily. Tartrate 0.15 %      . fenofibrate 160 MG tablet Take 1 tablet (160 mg total) by mouth daily.  90 tablet  3  . lisinopril-hydrochlorothiazide (PRINZIDE,ZESTORETIC) 20-12.5 MG per tablet Take 1 tablet by mouth daily.  90 tablet  3  . metoprolol (LOPRESSOR) 50 MG tablet Take 0.5 tablets (25 mg total) by mouth 2 (two) times daily.  1 tablet  0  . rosuvastatin (CRESTOR) 10 MG tablet Take 1 tablet (10 mg total) by mouth  daily.  90 tablet  3  . tadalafil (CIALIS) 20 MG tablet Take 1 tablet (20 mg total) by mouth daily as needed.  18 tablet  3     No Known Allergies   Family History  Problem Relation Age of Onset  . Alcohol abuse Mother   . Stroke Mother   . Stroke Father      History   Social History  . Marital Status: Married    Spouse Name: N/A    Number of Children: N/A  . Years of Education: N/A   Social History Main Topics  . Smoking status: Former Smoker    Quit date: 03/07/1972  . Smokeless tobacco: Never Used  . Alcohol Use: Yes     once a month  . Drug Use: No  . Sexually Active: None   Other Topics Concern  . None   Social History Narrative  . None     REVIEW OF SYSTEMS - PERTINENT POSITIVES ONLY: Minor intermittent discomfort. No signs or symptoms of intestinal obstruction. Always reducible.  EXAM: Filed Vitals:   08/11/11 1325  BP: 128/68  Pulse: 60  Temp: 98 F (36.7 C)  Resp: 18    HEENT: normocephalic; pupils equal and reactive; sclerae clear; dentition good; mucous membranes moist NECK:  symmetric on extension; no palpable anterior or posterior cervical lymphadenopathy; no supraclavicular masses; no tenderness CHEST: clear to auscultation bilaterally without rales, rhonchi,  or wheezes CARDIAC: regular rate and rhythm without significant murmur; peripheral pulses are full ABDOMEN: soft without distension; bowel sounds present; no mass; no hepatosplenomegaly; A small umbilical hernia with visible bulge. Reducible. Mildly tender. Fascial defect approximately 1 cm in diameter. EXT:  non-tender without edema; no deformity NEURO: no gross focal deficits; no sign of tremor   LABORATORY RESULTS: See [facility] (CHL-Epic) for most recent results   RADIOLOGY RESULTS: See [facility] (CHL-Epic) for most recent results   IMPRESSION: Reducible umbilical hernia, mildly symptomatic  PLAN: Patient and I discussed the above findings. We discussed  umbilical hernia repair utilizing mesh. We discussed the risk and benefits of the procedure including the risk of recurrence. We discussed restrictions on his activities following the procedure. He understands and wishes to proceed later this summer. We will make arrangements for outpatient surgery.  The risks and benefits of the procedure have been discussed at length with the patient.  The patient understands the proposed procedure, potential alternative treatments, and the course of recovery to be expected.  All of the patient's questions have been answered at this time.  The patient wishes to proceed with surgery.  Velora Heckler, MD, FACS General & Endocrine Surgery [facility]   Visit Diagnoses: 1. Umbilical hernia     Primary Care Physician: Nelwyn Salisbury, MD, MD

## 2011-10-04 ENCOUNTER — Other Ambulatory Visit: Payer: 59

## 2011-10-11 ENCOUNTER — Encounter: Payer: 59 | Admitting: Family Medicine

## 2011-10-18 ENCOUNTER — Other Ambulatory Visit (INDEPENDENT_AMBULATORY_CARE_PROVIDER_SITE_OTHER): Payer: 59

## 2011-10-18 DIAGNOSIS — Z0000 Encounter for general adult medical examination without abnormal findings: Secondary | ICD-10-CM

## 2011-10-18 DIAGNOSIS — E785 Hyperlipidemia, unspecified: Secondary | ICD-10-CM

## 2011-10-18 DIAGNOSIS — E119 Type 2 diabetes mellitus without complications: Secondary | ICD-10-CM

## 2011-10-18 LAB — TSH: TSH: 2.67 u[IU]/mL (ref 0.35–5.50)

## 2011-10-18 LAB — PSA: PSA: 0.04 ng/mL — ABNORMAL LOW (ref 0.10–4.00)

## 2011-10-18 LAB — BASIC METABOLIC PANEL
Calcium: 9.1 mg/dL (ref 8.4–10.5)
GFR: 56.56 mL/min — ABNORMAL LOW (ref >60.00)
Glucose, Bld: 96 mg/dL (ref 70–99)
Sodium: 138 mEq/L (ref 135–145)

## 2011-10-18 LAB — POCT URINALYSIS DIPSTICK
Bilirubin, UA: NEGATIVE
Ketones, UA: NEGATIVE
Protein, UA: NEGATIVE
Spec Grav, UA: 1.02

## 2011-10-18 LAB — CBC WITH DIFFERENTIAL/PLATELET
Basophils Absolute: 0 10*3/uL (ref 0.0–0.1)
Eosinophils Absolute: 0.1 10*3/uL (ref 0.0–0.7)
HCT: 43.2 % (ref 39.0–52.0)
Lymphs Abs: 1.6 10*3/uL (ref 0.7–4.0)
MCV: 89.6 fl (ref 78.0–100.0)
Monocytes Absolute: 0.3 10*3/uL (ref 0.1–1.0)
Platelets: 191 10*3/uL (ref 150.0–400.0)
RBC: 4.82 Mil/uL (ref 4.22–5.81)
RDW: 13.8 % (ref 11.5–14.6)
WBC: 5.5 10*3/uL (ref 4.5–10.5)

## 2011-10-18 LAB — LIPID PANEL
Total CHOL/HDL Ratio: 3
VLDL: 13.4 mg/dL (ref 0.0–40.0)

## 2011-10-18 LAB — HEPATIC FUNCTION PANEL
ALT: 21 U/L (ref 0–53)
Bilirubin, Direct: 0.1 mg/dL (ref 0.0–0.3)
Total Bilirubin: 0.4 mg/dL (ref 0.3–1.2)

## 2011-10-18 LAB — HEMOGLOBIN A1C: Hgb A1c MFr Bld: 5.9 % (ref 4.6–6.5)

## 2011-10-24 ENCOUNTER — Encounter (HOSPITAL_BASED_OUTPATIENT_CLINIC_OR_DEPARTMENT_OTHER): Payer: Self-pay | Admitting: *Deleted

## 2011-10-24 NOTE — Progress Notes (Signed)
Going out of town to [place] [place] prior to surg-will come in 8/22 for ekg and bmet Used to see dr wall for ? Silent inf mi-stress tests done-dr wall released him 2010 to prn-doing well-pt scuba dives freq

## 2011-10-25 ENCOUNTER — Encounter: Payer: Self-pay | Admitting: Family Medicine

## 2011-10-25 ENCOUNTER — Ambulatory Visit (INDEPENDENT_AMBULATORY_CARE_PROVIDER_SITE_OTHER): Payer: 59 | Admitting: Family Medicine

## 2011-10-25 VITALS — BP 118/80 | HR 66 | Temp 98.3°F | Ht 66.5 in | Wt 185.0 lb

## 2011-10-25 DIAGNOSIS — Z0000 Encounter for general adult medical examination without abnormal findings: Secondary | ICD-10-CM

## 2011-10-25 DIAGNOSIS — E119 Type 2 diabetes mellitus without complications: Secondary | ICD-10-CM

## 2011-10-25 MED ORDER — LISINOPRIL 20 MG PO TABS: 20.0000 mg | ORAL_TABLET | Freq: Every day | ORAL | Status: DC

## 2011-10-25 NOTE — Progress Notes (Signed)
  Subjective:    Patient ID: [name] [name], male    DOB: Dec 31, 1945, 66 y.o.   MRN: [id number]  HPI 66 yr old male for a cpx. He feels great and has no concerns. He is walking 3 miles a day and has dramatically changed his diet. He has lost a fair amount of weight. He is scheduled for a repair of his umbilical hernia on 11-01-11 per Dr. Gerrit Friends.   Review of Systems  Constitutional: Negative.   HENT: Negative.   Eyes: Negative.   Respiratory: Negative.   Cardiovascular: Negative.   Gastrointestinal: Negative.   Genitourinary: Negative.   Musculoskeletal: Negative.   Skin: Negative.   Neurological: Negative.   Hematological: Negative.   Psychiatric/Behavioral: Negative.        Objective:   Physical Exam  Constitutional: He is oriented to person, place, and time. He appears well-developed and well-nourished. No distress.  HENT:  Head: Normocephalic and atraumatic.  Right Ear: External ear normal.  Left Ear: External ear normal.  Nose: Nose normal.  Mouth/Throat: Oropharynx is clear and moist. No oropharyngeal exudate.  Eyes: Conjunctivae and EOM are normal. Pupils are equal, round, and reactive to light. Right eye exhibits no discharge. Left eye exhibits no discharge. No scleral icterus.  Neck: Neck supple. No JVD present. No tracheal deviation present. No thyromegaly present.  Cardiovascular: Normal rate, regular rhythm, normal heart sounds and intact distal pulses.  Exam reveals no gallop and no friction rub.   No murmur heard. Pulmonary/Chest: Effort normal and breath sounds normal. No respiratory distress. He has no wheezes. He has no rales. He exhibits no tenderness.  Abdominal: Soft. Bowel sounds are normal. He exhibits no distension and no mass. There is no tenderness. There is no rebound and no guarding.  Musculoskeletal: Normal range of motion. He exhibits no edema and no tenderness.  Lymphadenopathy:    He has no cervical adenopathy.  Neurological: He is alert and  oriented to person, place, and time. He has normal reflexes. No cranial nerve deficit. He exhibits normal muscle tone. Coordination normal.  Skin: Skin is warm and dry. No rash noted. He is not diaphoretic. No erythema. No pallor.  Psychiatric: He has a normal mood and affect. His behavior is normal. Judgment and thought content normal.          Assessment & Plan:  Well exam. We will switch from Lisinopril HCT to Lisinopril. Recheck in 6 months

## 2011-10-27 ENCOUNTER — Encounter (HOSPITAL_BASED_OUTPATIENT_CLINIC_OR_DEPARTMENT_OTHER)
Admission: RE | Admit: 2011-10-27 | Discharge: 2011-10-27 | Disposition: A | Discharge status: Home / Self Care | Payer: 59 | Source: Ambulatory Visit | Attending: Surgery | Admitting: Surgery

## 2011-10-27 LAB — BASIC METABOLIC PANEL
Chloride: 106 mEq/L (ref 96–112)
Creatinine, Ser: 1.48 mg/dL — ABNORMAL HIGH (ref 0.50–1.35)
GFR calc Af Amer: 56 mL/min — ABNORMAL LOW (ref >90)
Sodium: 143 mEq/L (ref 135–145)

## 2011-10-27 NOTE — Progress Notes (Signed)
Quick Note:  These results are acceptable for scheduled surgery.  Aedon Deason M. Mick Tanguma, MD, FACS [facility] Office: [phone]   ______ 

## 2011-11-01 ENCOUNTER — Encounter (HOSPITAL_BASED_OUTPATIENT_CLINIC_OR_DEPARTMENT_OTHER): Payer: Self-pay | Admitting: Surgery

## 2011-11-01 ENCOUNTER — Encounter (HOSPITAL_BASED_OUTPATIENT_CLINIC_OR_DEPARTMENT_OTHER): Payer: Self-pay

## 2011-11-01 ENCOUNTER — Ambulatory Visit (HOSPITAL_BASED_OUTPATIENT_CLINIC_OR_DEPARTMENT_OTHER): Payer: 59 | Admitting: Anesthesiology

## 2011-11-01 ENCOUNTER — Encounter (HOSPITAL_BASED_OUTPATIENT_CLINIC_OR_DEPARTMENT_OTHER): Payer: Self-pay | Admitting: Anesthesiology

## 2011-11-01 ENCOUNTER — Ambulatory Visit (HOSPITAL_BASED_OUTPATIENT_CLINIC_OR_DEPARTMENT_OTHER)
Admission: RE | Admit: 2011-11-01 | Discharge: 2011-11-01 | Disposition: A | Discharge status: Home / Self Care | Payer: 59 | Source: Ambulatory Visit | Attending: Surgery | Admitting: Surgery

## 2011-11-01 ENCOUNTER — Encounter (HOSPITAL_BASED_OUTPATIENT_CLINIC_OR_DEPARTMENT_OTHER)
Admission: RE | Disposition: A | Discharge status: Home / Self Care | Payer: Self-pay | Source: Ambulatory Visit | Attending: Surgery

## 2011-11-01 DIAGNOSIS — K429 Umbilical hernia without obstruction or gangrene: Secondary | ICD-10-CM | POA: Insufficient documentation

## 2011-11-01 DIAGNOSIS — N289 Disorder of kidney and ureter, unspecified: Secondary | ICD-10-CM | POA: Insufficient documentation

## 2011-11-01 DIAGNOSIS — Z01812 Encounter for preprocedural laboratory examination: Secondary | ICD-10-CM | POA: Insufficient documentation

## 2011-11-01 DIAGNOSIS — I2510 Atherosclerotic heart disease of native coronary artery without angina pectoris: Secondary | ICD-10-CM | POA: Insufficient documentation

## 2011-11-01 DIAGNOSIS — Z01810 Encounter for preprocedural cardiovascular examination: Secondary | ICD-10-CM | POA: Insufficient documentation

## 2011-11-01 DIAGNOSIS — E119 Type 2 diabetes mellitus without complications: Secondary | ICD-10-CM | POA: Insufficient documentation

## 2011-11-01 DIAGNOSIS — I10 Essential (primary) hypertension: Secondary | ICD-10-CM | POA: Insufficient documentation

## 2011-11-01 DIAGNOSIS — K432 Incisional hernia without obstruction or gangrene: Secondary | ICD-10-CM | POA: Diagnosis present

## 2011-11-01 HISTORY — PX: UMBILICAL HERNIA REPAIR: SHX196

## 2011-11-01 LAB — GLUCOSE, CAPILLARY: Glucose-Capillary: 94 mg/dL (ref 70–99)

## 2011-11-01 SURGERY — REPAIR, HERNIA, UMBILICAL, ADULT
Anesthesia: General | Site: Abdomen | Wound class: Clean

## 2011-11-01 MED ORDER — DEXAMETHASONE SODIUM PHOSPHATE 4 MG/ML IJ SOLN
INTRAMUSCULAR | Status: DC | PRN
  Administered 2011-11-01: 10 mg via INTRAVENOUS

## 2011-11-01 MED ORDER — ONDANSETRON HCL 4 MG/2ML IJ SOLN
INTRAMUSCULAR | Status: DC | PRN
  Administered 2011-11-01: 4 mg via INTRAVENOUS

## 2011-11-01 MED ORDER — MIDAZOLAM HCL 5 MG/5ML IJ SOLN
INTRAMUSCULAR | Status: DC | PRN
  Administered 2011-11-01: 1 mg via INTRAVENOUS

## 2011-11-01 MED ORDER — FENTANYL CITRATE 0.05 MG/ML IJ SOLN: 50.0000 ug | INTRAMUSCULAR | Status: DC | PRN

## 2011-11-01 MED ORDER — PROPOFOL 10 MG/ML IV BOLUS
INTRAVENOUS | Status: DC | PRN
  Administered 2011-11-01: 180 mg via INTRAVENOUS

## 2011-11-01 MED ORDER — LIDOCAINE HCL (CARDIAC) 20 MG/ML IV SOLN
INTRAVENOUS | Status: DC | PRN
  Administered 2011-11-01: 60 mg via INTRAVENOUS

## 2011-11-01 MED ORDER — HYDROMORPHONE HCL PF 1 MG/ML IJ SOLN
0.2500 mg | INTRAMUSCULAR | Status: DC | PRN
Start: 2011-11-01 — End: 2011-11-01

## 2011-11-01 MED ORDER — PROMETHAZINE HCL 25 MG/ML IJ SOLN: 6.2500 mg | INTRAMUSCULAR | Status: DC | PRN

## 2011-11-01 MED ORDER — HYDROCODONE-ACETAMINOPHEN 5-325 MG PO TABS: 1.0000 | ORAL_TABLET | ORAL | Status: AC | PRN

## 2011-11-01 MED ORDER — EPHEDRINE SULFATE 50 MG/ML IJ SOLN
INTRAMUSCULAR | Status: DC | PRN
  Administered 2011-11-01: 10 mg via INTRAVENOUS

## 2011-11-01 MED ORDER — GLYCOPYRROLATE 0.2 MG/ML IJ SOLN
INTRAMUSCULAR | Status: DC | PRN
  Administered 2011-11-01: 0.2 mg via INTRAVENOUS

## 2011-11-01 MED ORDER — BUPIVACAINE HCL (PF) 0.5 % IJ SOLN
INTRAMUSCULAR | Status: DC | PRN
  Administered 2011-11-01: 20 mL

## 2011-11-01 MED ORDER — FENTANYL CITRATE 0.05 MG/ML IJ SOLN
INTRAMUSCULAR | Status: DC | PRN
  Administered 2011-11-01 (×2): 50 ug via INTRAVENOUS

## 2011-11-01 MED ORDER — CEFAZOLIN SODIUM 1-5 GM-% IV SOLN
1.0000 g | INTRAVENOUS | Status: AC
  Administered 2011-11-01: 2 g via INTRAVENOUS

## 2011-11-01 MED ORDER — LACTATED RINGERS IV SOLN
INTRAVENOUS | Status: DC
  Administered 2011-11-01 (×2): via INTRAVENOUS

## 2011-11-01 MED ORDER — SUCCINYLCHOLINE CHLORIDE 20 MG/ML IJ SOLN
INTRAMUSCULAR | Status: DC | PRN
  Administered 2011-11-01: 120 mg via INTRAVENOUS

## 2011-11-01 MED ORDER — MIDAZOLAM HCL 2 MG/2ML IJ SOLN: 1.0000 mg | INTRAMUSCULAR | Status: DC | PRN

## 2011-11-01 SURGICAL SUPPLY — 39 items
APL SKNCLS STERI-STRIP NONHPOA (GAUZE/BANDAGES/DRESSINGS) ×1
BALL CTTN LRG ABS STRL LF (GAUZE/BANDAGES/DRESSINGS) ×1
BENZOIN TINCTURE PRP APPL 2/3 (GAUZE/BANDAGES/DRESSINGS) ×2 IMPLANT
BLADE SURG 15 STRL LF DISP TIS (BLADE) ×1 IMPLANT
BLADE SURG 15 STRL SS (BLADE) ×2
BLADE SURG ROTATE 9660 (MISCELLANEOUS) ×1 IMPLANT
CHLORAPREP W/TINT 26ML (MISCELLANEOUS) ×2 IMPLANT
CLOTH BEACON ORANGE TIMEOUT ST (SAFETY) ×2 IMPLANT
COTTONBALL LRG STERILE PKG (GAUZE/BANDAGES/DRESSINGS) ×2 IMPLANT
COVER MAYO STAND STRL (DRAPES) ×2 IMPLANT
COVER TABLE BACK 60X90 (DRAPES) ×2 IMPLANT
DECANTER SPIKE VIAL GLASS SM (MISCELLANEOUS) ×2 IMPLANT
DRAPE PED LAPAROTOMY (DRAPES) ×2 IMPLANT
DRAPE UTILITY XL STRL (DRAPES) ×2 IMPLANT
ELECT REM PT RETURN 9FT ADLT (ELECTROSURGICAL) ×2
ELECTRODE REM PT RTRN 9FT ADLT (ELECTROSURGICAL) ×1 IMPLANT
GAUZE SPONGE 4X4 16PLY XRAY LF (GAUZE/BANDAGES/DRESSINGS) IMPLANT
GLOVE BIO SURGEON STRL SZ 6.5 (GLOVE) ×1 IMPLANT
GLOVE SURG ORTHO 8.0 STRL STRW (GLOVE) ×2 IMPLANT
GOWN BRE IMP PREV XXLGXLNG (GOWN DISPOSABLE) ×1 IMPLANT
GOWN PREVENTION PLUS XLARGE (GOWN DISPOSABLE) ×1 IMPLANT
GOWN PREVENTION PLUS XXLARGE (GOWN DISPOSABLE) ×2 IMPLANT
NDL HYPO 25X1 1.5 SAFETY (NEEDLE) ×1 IMPLANT
NEEDLE HYPO 25X1 1.5 SAFETY (NEEDLE) ×2 IMPLANT
NS IRRIG 1000ML POUR BTL (IV SOLUTION) ×2 IMPLANT
PACK BASIN DAY SURGERY FS (CUSTOM PROCEDURE TRAY) ×2 IMPLANT
PATCH VENTRAL SMALL 4.3 (Mesh Specialty) ×1 IMPLANT
PENCIL BUTTON HOLSTER BLD 10FT (ELECTRODE) ×2 IMPLANT
SLEEVE SCD COMPRESS KNEE MED (MISCELLANEOUS) ×2 IMPLANT
STRIP CLOSURE SKIN 1/2X4 (GAUZE/BANDAGES/DRESSINGS) ×2 IMPLANT
SUT ETHIBOND 0 MO6 C/R (SUTURE) ×1 IMPLANT
SUT MNCRL AB 4-0 PS2 18 (SUTURE) ×1 IMPLANT
SUT VICRYL 3-0 CR8 SH (SUTURE) ×2 IMPLANT
SUT VICRYL 4-0 PS2 18IN ABS (SUTURE) IMPLANT
SYR BULB 3OZ (MISCELLANEOUS) IMPLANT
SYR CONTROL 10ML LL (SYRINGE) ×2 IMPLANT
TOWEL OR 17X24 6PK STRL BLUE (TOWEL DISPOSABLE) ×3 IMPLANT
TOWEL OR NON WOVEN STRL DISP B (DISPOSABLE) ×2 IMPLANT
WATER STERILE IRR 1000ML POUR (IV SOLUTION) ×1 IMPLANT

## 2011-11-01 NOTE — Anesthesia Procedure Notes (Signed)
Procedure Name: Intubation Date/Time: 11/01/2011 7:50 AM Performed by: Meyer Russel Pre-anesthesia Checklist: Patient identified, Emergency Drugs available, Suction available and Patient being monitored Patient Re-evaluated:Patient Re-evaluated prior to inductionOxygen Delivery Method: Circle System Utilized Preoxygenation: Pre-oxygenation with 100% oxygen Intubation Type: IV induction Ventilation: Mask ventilation without difficulty Laryngoscope Size: Miller and 2 Grade View: Grade I Tube type: Oral Tube size: 8.0 mm Number of attempts: 1 Airway Equipment and Method: stylet Placement Confirmation: ETT inserted through vocal cords under direct vision,  positive ETCO2 and breath sounds checked- equal and bilateral Secured at: 23 cm Tube secured with: Tape Dental Injury: Teeth and Oropharynx as per pre-operative assessment

## 2011-11-01 NOTE — Transfer of Care (Signed)
Immediate Anesthesia Transfer of Care Note  Patient: [name] [name]  Procedure(s) Performed: Procedure(s) (LRB): HERNIA REPAIR UMBILICAL ADULT (N/A)  Patient Location: PACU  Anesthesia Type: General  Level of Consciousness: awake, alert  and oriented  Airway & Oxygen Therapy: Patient Spontanous Breathing and Patient connected to face mask oxygen  Post-op Assessment: Report given to PACU RN, Post -op Vital signs reviewed and stable and Patient moving all extremities  Post vital signs: Reviewed and stable  Complications: No apparent anesthesia complications

## 2011-11-01 NOTE — Brief Op Note (Signed)
11/01/2011  8:39 AM  PATIENT:  [name] [name]  66 y.o. male  PRE-OPERATIVE DIAGNOSIS:  reducible umbilical hernia  POST-OPERATIVE DIAGNOSIS:  reducible umbilical hernia  PROCEDURE:  Repair Umbilical hernia with Ethicon Ventral Patch mesh, 4.3 cm  SURGEON:  Surgeon(s) and Role:    * Velora Heckler, MD - Primary  ANESTHESIA:   general  EBL:  Total I/O In: 1000 [I.V.:1000] Out: -   BLOOD ADMINISTERED:none  DRAINS: none   LOCAL MEDICATIONS USED:  MARCAINE     SPECIMEN:  No Specimen  DISPOSITION OF SPECIMEN:  N/A  COUNTS:  YES  TOURNIQUET:  * No tourniquets in log *  DICTATION: .Other Dictation: Dictation Number [id number]  PLAN OF CARE: Discharge to home after PACU  PATIENT DISPOSITION:  PACU - hemodynamically stable.   Delay start of Pharmacological VTE agent (>24hrs) due to surgical blood loss or risk of bleeding: yes  Velora Heckler, MD, [facility] Office: [phone]

## 2011-11-01 NOTE — Addendum Note (Signed)
Addendum  created 11/01/11 1035 by Ty Oshima Wolfe Aanya Haynes, CRNA   Modules edited:Anesthesia Responsible Staff    

## 2011-11-01 NOTE — H&P (Signed)
[name] [name] is an 66 y.o. male.    Chief Complaint: Umbilical hernia  HPI: Patient is a 66 year old white male referred by his urologist for evaluation of umbilical hernia. Patient first noted a bulge at the umbilicus approximately 2 months ago. This occurred while straining. It has gradually increased in size. It causes minor discomfort. It has always been reducible.  Patient underwent robotic prostatectomy in 2009 for prostate cancer. A port site was located adjacent to the umbilicus. Patient denies any other signs of inguinal hernia. Patient denies any other prior abdominal surgery.    Past Medical History  Diagnosis Date  . HYPERLIPIDEMIA 09/26/2006  . GOUT 05/16/2007  . HYPERTENSION 04/13/2007  . CORONARY ARTERY DISEASE 10/05/2009  . DIVERTICULOSIS, COLON 04/13/2007  . DEGENERATIVE JOINT DISEASE, MODERATE 04/13/2007  . JOINT EFFUSION, KNEE 06/15/2009  . ELEVATED PROSTATE SPECIFIC ANTIGEN 04/14/2007  . ABNORMAL EKG 10/07/2008  . ADVEF, DRUG/MED/BIOL SUBST, OTHER DRUG NOS 09/26/2006  . Glaucoma   . Diabetes mellitus     type 2, diet controlled   . Renal insufficiency   . PROSTATE CANCER 10/05/2009    sees Dr. Annabell [name]  . Melanoma     Past Surgical History  Procedure Date  . Vasectomy   . Tonsilectomy, adenoidectomy, bilateral myringotomy and tubes   . Bunionectomy     bilateral  . Prostatectomy     per Dr. Annabell [name]  . Cystectomy     benign from hand  . Colonoscopy 2007    at [facility], clear, repeat in 10 yrs   . Melanoma excision 2012    per Dr. Nita [name]  . Knee arthroscopy Jan. 2013    right knee, per Dr. Ranell [name]     Family History  Problem Relation Age of Onset  . Alcohol abuse Mother   . Stroke Mother   . Stroke Father    Social History:  reports that he quit smoking about 39 years ago. He has never used smokeless tobacco. He reports that he drinks alcohol. He reports that he does not use illicit drugs.  Allergies: No Known Allergies  Medications Prior to  Admission  Medication Sig Dispense Refill  . aspirin 81 MG tablet Take 81 mg by mouth daily.        . bimatoprost (LUMIGAN) 0.03 % ophthalmic drops Place 1 drop into both eyes at bedtime.        . brimonidine (ALPHAGAN P) 0.1 % SOLN Place 1 drop into both eyes 2 (two) times daily. Tartrate 0.15 %      . fenofibrate 160 MG tablet Take 1 tablet (160 mg total) by mouth daily.  90 tablet  3  . lisinopril (PRINIVIL,ZESTRIL) 20 MG tablet Take 1 tablet (20 mg total) by mouth daily.  90 tablet  3  . Multiple Vitamins-Minerals (MULTIVITAMIN WITH MINERALS) tablet Take 1 tablet by mouth daily.      . rosuvastatin (CRESTOR) 10 MG tablet Take 1 tablet (10 mg total) by mouth daily.  90 tablet  3  . tadalafil (CIALIS) 20 MG tablet Take 1 tablet (20 mg total) by mouth daily as needed.  18 tablet  3  . metoprolol (LOPRESSOR) 50 MG tablet Take 0.5 tablets (25 mg total) by mouth 2 (two) times daily.  1 tablet  0    Results for orders placed during the hospital encounter of 11/01/11 (from the past 48 hour(s))  GLUCOSE, CAPILLARY     Status: Normal   Collection Time   11/01/11  6:53  AM      Component Value Range Comment   Glucose-Capillary 94  70 - 99 mg/dL   POCT HEMOGLOBIN-HEMACUE     Status: Normal   Collection Time   11/01/11  6:54 AM      Component Value Range Comment   Hemoglobin 14.3  13.0 - 17.0 g/dL    No results found.  ROS  Blood pressure 178/80, temperature 97.7 F (36.5 C), temperature source Oral, resp. rate 57, height 5' 6.5" (1.689 m), weight 185 lb (83.915 kg), SpO2 99.00%. Physical Exam   Assessment/Plan Umbilical hernia, symptomatic  - plan repair of hernia with mesh as out-patient  The risks and benefits of the procedure have been discussed at length with the patient.  The patient understands the proposed procedure, potential alternative treatments, and the course of recovery to be expected.  All of the patient's questions have been answered at this time.  The patient wishes to  proceed with surgery.  Velora Heckler, MD, [facility] Office: [phone]    [name] [name] Judie Petit 11/01/2011, 7:29 AM

## 2011-11-01 NOTE — Anesthesia Preprocedure Evaluation (Addendum)
Anesthesia Evaluation  Patient identified by MRN, date of birth, ID band Patient awake    Reviewed: Allergy & Precautions, H&P , NPO status , Patient's Chart, lab work & pertinent test results  Airway Mallampati: I TM Distance: >3 FB Neck ROM: Full    Dental   Pulmonary former smoker,    Pulmonary exam normal       Cardiovascular hypertension, + CAD     Neuro/Psych    GI/Hepatic   Endo/Other    Renal/GU Renal InsufficiencyRenal disease     Musculoskeletal   Abdominal   Peds  Hematology   Anesthesia Other Findings   Reproductive/Obstetrics                           Anesthesia Physical Anesthesia Plan  ASA: III  Anesthesia Plan: General   Post-op Pain Management:    Induction: Intravenous  Airway Management Planned: Oral ETT  Additional Equipment:   Intra-op Plan:   Post-operative Plan: Extubation in OR  Informed Consent: I have reviewed the patients History and Physical, chart, labs and discussed the procedure including the risks, benefits and alternatives for the proposed anesthesia with the patient or authorized representative who has indicated his/her understanding and acceptance.     Plan Discussed with: CRNA and Surgeon  Anesthesia Plan Comments:         Anesthesia Quick Evaluation

## 2011-11-01 NOTE — Anesthesia Postprocedure Evaluation (Signed)
  Anesthesia Post-op Note  Patient: [name] [name]  Procedure(s) Performed: Procedure(s) (LRB): HERNIA REPAIR UMBILICAL ADULT (N/A)  Patient Location: PACU  Anesthesia Type: General  Level of Consciousness: awake and alert   Airway and Oxygen Therapy: Patient Spontanous Breathing  Post-op Pain: mild  Post-op Assessment: Post-op Vital signs reviewed, Patient's Cardiovascular Status Stable, Respiratory Function Stable, Patent Airway, No signs of Nausea or vomiting and Pain level controlled  Post-op Vital Signs: stable  Complications: No apparent anesthesia complications

## 2011-11-01 NOTE — Addendum Note (Signed)
Addendum  created 11/01/11 1326 by Meyer Russel, CRNA   Modules edited:Anesthesia Events

## 2011-11-01 NOTE — Addendum Note (Signed)
Addendum  created 11/01/11 1035 by Meyer Russel, CRNA   Modules edited:Anesthesia Responsible Staff

## 2011-11-02 ENCOUNTER — Telehealth (INDEPENDENT_AMBULATORY_CARE_PROVIDER_SITE_OTHER): Payer: Self-pay

## 2011-11-02 ENCOUNTER — Encounter (HOSPITAL_BASED_OUTPATIENT_CLINIC_OR_DEPARTMENT_OTHER): Payer: Self-pay | Admitting: Surgery

## 2011-11-02 NOTE — Op Note (Signed)
NAME:  CODA, MATHEY NO.:  [id number]  MEDICAL RECORD NO.:  [id number]  LOCATION:                                 FACILITY:  PHYSICIAN:  Velora Heckler, MD      DATE OF BIRTH:  December 28, 1945  DATE OF PROCEDURE:  11/01/2011                               OPERATIVE REPORT   PREOPERATIVE DIAGNOSIS:  Reducible umbilical hernia.  POSTOPERATIVE DIAGNOSIS:  Reducible umbilical hernia.  PROCEDURE:  Repair of umbilical hernia with Ethicon ventral patch mesh.  SURGEON:  Velora Heckler, MD, FACS  ANESTHESIA:  General per Dr. Bedelia Person, MD  ESTIMATED BLOOD LOSS:  Minimal.  PREPARATION:  ChloraPrep.  COMPLICATIONS:  None.  INDICATIONS:  The patient is a 66 year old white male who presents on referral from his urologist with a symptomatic reducible umbilical hernia.  He now comes to the operating room for repair.  BODY OF REPORT:  Procedure was done in OR #3 at the [facility].  The patient was brought to the operating room, placed in a supine position on the operating room table.  Following administration of general anesthesia, the patient was prepped and draped in the usual strict aseptic fashion.  After ascertaining that, an adequate level of anesthesia been achieved, an infraumbilical incision was made transversely with a #15 blade.  Dissection was carried through subcutaneous tissues to the fascia.  Hernia defect was identified. Hernia sac was opened and the umbilicus was completely mobilized off the anterior abdominal wall.  Fascial defect measured 1 cm in diameter. Hernia contents were reduced back within the peritoneal cavity and the preperitoneal plane was developed.  Using an Ethicon 4.3 cm ventral patch mesh, the mesh was prepared and inserted into the preperitoneal space and deployed circumferentially.  It was secured to the fascia circumferentially with interrupted 0 Ethibond sutures.  Local field block was placed with Marcaine.  Umbilicus  was reaffixed to the anterior abdominal wall with an interrupted 0 Ethibond suture.  Subcutaneous tissues were closed with interrupted 3-0 Vicryl sutures.  The skin was anesthetized with local anesthetic.  Skin edges were reapproximated with a running 4-0 Monocryl subcuticular suture.  Wound was washed and dried and benzoin and Steri-Strips were applied.  Sterile dressings were applied.  The patient was awakened from anesthesia and brought to the recovery room.  The patient tolerated the procedure well.  Velora Heckler, MD, [facility] Office: [phone]    TMG/MEDQ  D:  11/01/2011  T:  11/01/2011  Job:  [id number]  cc:   Excell Seltzer. Annabell Howells, M.D.

## 2011-11-02 NOTE — Telephone Encounter (Signed)
Pt given po appt. 

## 2011-11-16 ENCOUNTER — Encounter (INDEPENDENT_AMBULATORY_CARE_PROVIDER_SITE_OTHER): Payer: Self-pay | Admitting: Surgery

## 2011-11-16 ENCOUNTER — Ambulatory Visit (INDEPENDENT_AMBULATORY_CARE_PROVIDER_SITE_OTHER): Payer: 59 | Admitting: Surgery

## 2011-11-16 VITALS — BP 130/82 | HR 80 | Temp 97.6°F | Resp 16 | Ht 66.0 in | Wt 188.0 lb

## 2011-11-16 DIAGNOSIS — K429 Umbilical hernia without obstruction or gangrene: Secondary | ICD-10-CM

## 2011-11-16 NOTE — Progress Notes (Signed)
General Surgery [facility]  Visit Diagnoses: 1. Umbilical hernia     HISTORY: Patient underwent umbilical hernia repair with mesh 2 weeks ago. He has had no problems postoperatively.  EXAM: Abdominal wound is healing nicely. The remaining Steri-Strips are removed. No sign of infection. No sign of seroma. No sign of recurrence.  IMPRESSION: Status post umbilical hernia repair with mesh  PLAN: Patient will begin applying topical creams to his incision. He is restricted to 25 pounds lifting for the next 3 weeks. At that point he may resume all normal activities. He will return to see me as needed.  Velora Heckler, MD, FACS General & Endocrine Surgery [facility]

## 2011-11-16 NOTE — Patient Instructions (Signed)
  COCOA BUTTER & VITAMIN E CREAM  ([employer]mer[employer] or other brand)  Apply cocoa butter/vitamin E cream to your incision 2 - 3 times daily.  Massage cream into incision for one minute with each application.  Use sunscreen (50 SPF or higher) for first 6 months after surgery if area is exposed to sun.  You may substitute Mederma or other scar reducing creams as desired.   

## 2012-03-07 HISTORY — PX: GLAUCOMA SURGERY: SHX656

## 2012-04-18 ENCOUNTER — Other Ambulatory Visit (INDEPENDENT_AMBULATORY_CARE_PROVIDER_SITE_OTHER): Payer: 59

## 2012-04-18 DIAGNOSIS — E119 Type 2 diabetes mellitus without complications: Secondary | ICD-10-CM

## 2012-04-18 LAB — BASIC METABOLIC PANEL
BUN: 18 mg/dL (ref 6–23)
CO2: 27 mEq/L (ref 19–32)
Calcium: 9.2 mg/dL (ref 8.4–10.5)
Chloride: 108 mEq/L (ref 96–112)
Creatinine, Ser: 1.5 mg/dL (ref 0.4–1.5)

## 2012-04-18 LAB — HEPATIC FUNCTION PANEL
AST: 22 U/L (ref 0–37)
Albumin: 4.3 g/dL (ref 3.5–5.2)
Alkaline Phosphatase: 35 U/L — ABNORMAL LOW (ref 39–117)
Bilirubin, Direct: 0 mg/dL (ref 0.0–0.3)
Total Protein: 6.8 g/dL (ref 6.0–8.3)

## 2012-04-18 LAB — LIPID PANEL
Cholesterol: 151 mg/dL (ref 0–200)
LDL Cholesterol: 76 mg/dL (ref 0–99)
Triglycerides: 122 mg/dL (ref 0.0–149.0)

## 2012-04-18 LAB — HEMOGLOBIN A1C: Hgb A1c MFr Bld: 5.9 % (ref 4.6–6.5)

## 2012-04-19 ENCOUNTER — Other Ambulatory Visit: Payer: 59

## 2012-04-25 NOTE — Progress Notes (Signed)
Quick Note:  Pt has follow up on 04/26/12 will go over then. ______

## 2012-04-26 ENCOUNTER — Encounter: Payer: Self-pay | Admitting: Family Medicine

## 2012-04-26 ENCOUNTER — Ambulatory Visit (INDEPENDENT_AMBULATORY_CARE_PROVIDER_SITE_OTHER): Payer: 59 | Admitting: Family Medicine

## 2012-04-26 VITALS — BP 130/80 | HR 63 | Temp 98.4°F | Wt 195.0 lb

## 2012-04-26 DIAGNOSIS — R7309 Other abnormal glucose: Secondary | ICD-10-CM

## 2012-04-26 DIAGNOSIS — R739 Hyperglycemia, unspecified: Secondary | ICD-10-CM

## 2012-04-26 DIAGNOSIS — E785 Hyperlipidemia, unspecified: Secondary | ICD-10-CM

## 2012-04-26 DIAGNOSIS — N289 Disorder of kidney and ureter, unspecified: Secondary | ICD-10-CM

## 2012-04-26 DIAGNOSIS — I10 Essential (primary) hypertension: Secondary | ICD-10-CM

## 2012-04-26 MED ORDER — FENOFIBRATE 160 MG PO TABS: 160.0000 mg | ORAL_TABLET | Freq: Every day | ORAL | Status: DC

## 2012-04-26 NOTE — Progress Notes (Signed)
  Subjective:    Patient ID: [name] [name], male    DOB: 1945/09/20, 67 y.o.   MRN: [id number]  HPI Here for follow up. He feels great and has no concerns. His surgery to repair the umbilical hernia went well.    Review of Systems  Constitutional: Negative.   Respiratory: Negative.   Cardiovascular: Negative.        Objective:   Physical Exam  Constitutional: He appears well-developed and well-nourished.  Neck: No thyromegaly present.  Cardiovascular: Normal rate, regular rhythm, normal heart sounds and intact distal pulses.   Pulmonary/Chest: Effort normal and breath sounds normal.  Lymphadenopathy:    He has no cervical adenopathy.          Assessment & Plan:  Recheck in 6 months

## 2012-05-14 ENCOUNTER — Other Ambulatory Visit: Payer: Self-pay | Admitting: Family Medicine

## 2012-10-10 DIAGNOSIS — Z8546 Personal history of malignant neoplasm of prostate: Secondary | ICD-10-CM | POA: Diagnosis not present

## 2012-10-17 DIAGNOSIS — C61 Malignant neoplasm of prostate: Secondary | ICD-10-CM | POA: Diagnosis not present

## 2012-10-17 DIAGNOSIS — N529 Male erectile dysfunction, unspecified: Secondary | ICD-10-CM | POA: Diagnosis not present

## 2012-10-17 DIAGNOSIS — N393 Stress incontinence (female) (male): Secondary | ICD-10-CM | POA: Diagnosis not present

## 2012-10-25 ENCOUNTER — Encounter: Payer: Self-pay | Admitting: Family Medicine

## 2012-10-25 ENCOUNTER — Ambulatory Visit (INDEPENDENT_AMBULATORY_CARE_PROVIDER_SITE_OTHER): Payer: Medicare Other | Admitting: Family Medicine

## 2012-10-25 VITALS — BP 124/82 | HR 72 | Temp 98.0°F | Ht 67.0 in | Wt 200.0 lb

## 2012-10-25 DIAGNOSIS — E119 Type 2 diabetes mellitus without complications: Secondary | ICD-10-CM | POA: Diagnosis not present

## 2012-10-25 DIAGNOSIS — E785 Hyperlipidemia, unspecified: Secondary | ICD-10-CM

## 2012-10-25 DIAGNOSIS — I2510 Atherosclerotic heart disease of native coronary artery without angina pectoris: Secondary | ICD-10-CM | POA: Diagnosis not present

## 2012-10-25 DIAGNOSIS — C61 Malignant neoplasm of prostate: Secondary | ICD-10-CM

## 2012-10-25 DIAGNOSIS — I10 Essential (primary) hypertension: Secondary | ICD-10-CM | POA: Diagnosis not present

## 2012-10-25 LAB — POCT URINALYSIS DIPSTICK
Bilirubin, UA: NEGATIVE
Blood, UA: NEGATIVE
Glucose, UA: NEGATIVE
Spec Grav, UA: >=1.03
Urobilinogen, UA: 0.2

## 2012-10-25 LAB — CBC WITH DIFFERENTIAL/PLATELET
Basophils Relative: 0.4 % (ref 0.0–3.0)
Eosinophils Relative: 0.8 % (ref 0.0–5.0)
HCT: 42.7 % (ref 39.0–52.0)
Hemoglobin: 14.6 g/dL (ref 13.0–17.0)
Lymphs Abs: 1.8 10*3/uL (ref 0.7–4.0)
Monocytes Relative: 6.6 % (ref 3.0–12.0)
Neutro Abs: 6.1 10*3/uL (ref 1.4–7.7)
WBC: 8.5 10*3/uL (ref 4.5–10.5)

## 2012-10-25 LAB — HEPATIC FUNCTION PANEL
Alkaline Phosphatase: 32 U/L — ABNORMAL LOW (ref 39–117)
Bilirubin, Direct: 0 mg/dL (ref 0.0–0.3)
Total Protein: 7.4 g/dL (ref 6.0–8.3)

## 2012-10-25 LAB — BASIC METABOLIC PANEL
CO2: 27 mEq/L (ref 19–32)
Calcium: 9.4 mg/dL (ref 8.4–10.5)
Potassium: 4 mEq/L (ref 3.5–5.1)
Sodium: 140 mEq/L (ref 135–145)

## 2012-10-25 LAB — LIPID PANEL
HDL: 45.7 mg/dL (ref >39.00)
Total CHOL/HDL Ratio: 3

## 2012-10-25 LAB — HEMOGLOBIN A1C: Hgb A1c MFr Bld: 6.1 % (ref 4.6–6.5)

## 2012-10-25 NOTE — Progress Notes (Signed)
  Subjective:    Patient ID: [name] [name], male    DOB: [birth year], 67 y.o.   MRN: [id number]  HPI 67 yr old male for a cpx. He feels fine. He recently had bilateral laser surgery for glaucoma at the [place] with excellent results. He sees Dr. Wilson Singer frequently for his hx of prostate cancer. His PSA has been slowly rising, and 3 weeks ago it was up to 0.7. Dr. Wilson Singer thinks the cancer has resurfaced and he is considering starting [name] on hormonal treatments.    Review of Systems  Constitutional: Negative.   HENT: Negative.   Eyes: Negative.   Respiratory: Negative.   Cardiovascular: Negative.   Gastrointestinal: Negative.   Genitourinary: Negative.   Musculoskeletal: Negative.   Skin: Negative.   Neurological: Negative.   Psychiatric/Behavioral: Negative.        Objective:   Physical Exam  Constitutional: He is oriented to person, place, and time. He appears well-developed and well-nourished. No distress.  HENT:  Head: Normocephalic and atraumatic.  Right Ear: External ear normal.  Left Ear: External ear normal.  Nose: Nose normal.  Mouth/Throat: Oropharynx is clear and moist. No oropharyngeal exudate.  Eyes: Conjunctivae and EOM are normal. Pupils are equal, round, and reactive to light. Right eye exhibits no discharge. Left eye exhibits no discharge. No scleral icterus.  Neck: Neck supple. No JVD present. No tracheal deviation present. No thyromegaly present.  Cardiovascular: Normal rate, regular rhythm, normal heart sounds and intact distal pulses.  Exam reveals no gallop and no friction rub.   No murmur heard. EKG normal   Pulmonary/Chest: Effort normal and breath sounds normal. No respiratory distress. He has no wheezes. He has no rales. He exhibits no tenderness.  Abdominal: Soft. Bowel sounds are normal. He exhibits no distension and no mass. There is no tenderness. There is no rebound and no guarding.  Musculoskeletal: Normal range of motion. He exhibits no edema and no  tenderness.  Lymphadenopathy:    He has no cervical adenopathy.  Neurological: He is alert and oriented to person, place, and time. He has normal reflexes. No cranial nerve deficit. He exhibits normal muscle tone. Coordination normal.  Skin: Skin is warm and dry. No rash noted. He is not diaphoretic. No erythema. No pallor.  Psychiatric: He has a normal mood and affect. His behavior is normal. Judgment and thought content normal.          Assessment & Plan:  Well exam. Get fasting labs

## 2012-12-05 ENCOUNTER — Telehealth (INDEPENDENT_AMBULATORY_CARE_PROVIDER_SITE_OTHER): Payer: Self-pay

## 2012-12-05 NOTE — Telephone Encounter (Signed)
Message copied by Joanette Gula on Wed Dec 05, 2012  1:28 PM ------      Message from: Larry Sierras      Created: Wed Dec 05, 2012  1:13 PM      Regarding: APPT      Contact: [phone]       PT NEEDS APPT FOR POSS INCIS? HERNIA/SX AUG 2013/HERNIA REP PER TG.  NO PAIN, BUT AT SX SITE/ENLARGED TO SIZE OF "GRAPEFRUIT".  GM ------

## 2012-12-05 NOTE — Telephone Encounter (Signed)
Pt called and given appt for 12-06-12 to have area checked.

## 2012-12-06 ENCOUNTER — Ambulatory Visit (INDEPENDENT_AMBULATORY_CARE_PROVIDER_SITE_OTHER): Payer: Medicare Other | Admitting: Surgery

## 2012-12-06 ENCOUNTER — Encounter (INDEPENDENT_AMBULATORY_CARE_PROVIDER_SITE_OTHER): Payer: Self-pay | Admitting: Surgery

## 2012-12-06 VITALS — BP 144/90 | HR 72 | Temp 98.6°F | Resp 15 | Ht 66.0 in | Wt 199.2 lb

## 2012-12-06 DIAGNOSIS — K432 Incisional hernia without obstruction or gangrene: Secondary | ICD-10-CM

## 2012-12-06 NOTE — Progress Notes (Signed)
General Surgery [facility]  Chief Complaint  Patient presents with  . Follow-up    umbilical hernia repair 11/01/2011    HISTORY: Patient is a 67 year old male who underwent umbilical hernia repair with mesh patch on 11/01/2011. Patient was seen back postoperatively and did well. Over the past year he has remained active. However, within the past several weeks, he has noted a progressive bulge at the umbilicus. This causes minor discomfort. He denies any signs or symptoms of obstruction. He presents today for evaluation.  Past Medical History  Diagnosis Date  . HYPERLIPIDEMIA 09/26/2006  . GOUT 05/16/2007  . HYPERTENSION 04/13/2007  . CORONARY ARTERY DISEASE 10/05/2009  . DIVERTICULOSIS, COLON 04/13/2007  . DEGENERATIVE JOINT DISEASE, MODERATE 04/13/2007  . JOINT EFFUSION, KNEE 06/15/2009  . ELEVATED PROSTATE SPECIFIC ANTIGEN 04/14/2007  . ABNORMAL EKG 10/07/2008  . ADVEF, DRUG/MED/BIOL SUBST, OTHER DRUG NOS 09/26/2006  . Diabetes mellitus     type 2, diet controlled   . Renal insufficiency   . PROSTATE CANCER 10/05/2009    sees Dr. Annabell Howells  . Melanoma   . Glaucoma     sees [facility]     Current Outpatient Prescriptions  Medication Sig Dispense Refill  . aspirin 81 MG tablet Take 81 mg by mouth daily.        . bimatoprost (LUMIGAN) 0.03 % ophthalmic drops Place 1 drop into both eyes at bedtime.        . brimonidine (ALPHAGAN P) 0.1 % SOLN Place 1 drop into both eyes 2 (two) times daily. Tartrate 0.15 %      . fenofibrate 160 MG tablet Take 1 tablet (160 mg total) by mouth daily.  90 tablet  3  . lisinopril (PRINIVIL,ZESTRIL) 20 MG tablet Take 1 tablet (20 mg total) by mouth daily.  90 tablet  3  . metoprolol (LOPRESSOR) 50 MG tablet Take 0.5 tablets (25 mg total) by mouth 2 (two) times daily.  1 tablet  0  . Multiple Vitamins-Minerals (MULTIVITAMIN WITH MINERALS) tablet Take 1 tablet by mouth 2 (two) times a week.       . rosuvastatin (CRESTOR) 10 MG tablet Take 1  tablet (10 mg total) by mouth daily.  90 tablet  3  . tadalafil (CIALIS) 20 MG tablet Take 1 tablet (20 mg total) by mouth daily as needed.  18 tablet  3   No current facility-administered medications for this visit.    No Known Allergies  Family History  Problem Relation Age of Onset  . Alcohol abuse Mother   . Stroke Mother   . Stroke Father     History   Social History  . Marital Status: Married    Spouse Name: N/A    Number of Children: N/A  . Years of Education: N/A   Social History Main Topics  . Smoking status: Former Smoker    Quit date: 03/07/1972  . Smokeless tobacco: Never Used  . Alcohol Use: Yes     Comment: rare  . Drug Use: No  . Sexual Activity: None   Other Topics Concern  . None   Social History Narrative  . None    REVIEW OF SYSTEMS - PERTINENT POSITIVES ONLY: No signs or symptoms of obstruction. Intermittent minor discomfort. Gradual increase in size of bulge and umbilicus.  EXAM: Filed Vitals:   12/06/12 1501  BP: 144/90  Pulse: 72  Temp: 98.6 F (37 C)  Resp: 15    HEENT: normocephalic; pupils  equal and reactive; sclerae clear; dentition good; mucous membranes moist NECK:  symmetric on extension; no palpable anterior or posterior cervical lymphadenopathy; no supraclavicular masses; no tenderness CHEST: clear to auscultation bilaterally without rales, rhonchi, or wheezes CARDIAC: regular rate and rhythm without significant murmur; peripheral pulses are full ABDOMEN: soft without distension; bowel sounds present; no mass; no hepatosplenomegaly; well-healed incision below umbilicus; palpable bulge at level of umbilicus both in the standing and recumbent position; hernia is reducible; there is approximately a 3 cm fascial defect; I am not able to palpate the previously placed mesh EXT:  non-tender without edema; no deformity NEURO: no gross focal deficits; no sign of tremor   LABORATORY RESULTS: See [facility] (CHL-Epic) for most  recent results  RADIOLOGY RESULTS: See [facility] (CHL-Epic) for most recent results  IMPRESSION: Incisional hernia at umbilicus  PLAN: The patient and I discussed these findings. Either the mesh patch has pushed completely through the fascial defect and the defect has enlarged, or an edge of the mesh has come loose allowing for a recurrent hernia. Patient and I discussed options for management including laparoscopic ventral hernia repair with mesh versus open repair. I believe the patient would prefer open repair in order to maintain the contour of his abdominal wall as he remains very physically active. Therefore we will plan on an open repair with exploration of the previous hernia repair, removal of the previous mesh, and repair of the defect with a larger mesh patch or onlay sheet of mesh. I explained to the patient that he may or may not have a subcutaneous drain.  We discussed his restrictions following surgery. He understands and wishes to proceed in the near future.  The risks and benefits of the procedure have been discussed at length with the patient.  The patient understands the proposed procedure, potential alternative treatments, and the course of recovery to be expected.  All of the patient's questions have been answered at this time.  The patient wishes to proceed with surgery.  Velora Heckler, MD, FACS General & Endocrine Surgery [facility]  Primary Care Physician: Nelwyn Salisbury, MD

## 2012-12-06 NOTE — Patient Instructions (Signed)
[facility]  HERNIA REPAIR POST OP INSTRUCTIONS  Always review your discharge instruction sheet given to you by the facility where your surgery was performed.  1. A  prescription for pain medication may be given to you upon discharge.  Take your pain medication as prescribed.  If narcotic pain medicine is not needed, then you may take acetaminophen (Tylenol) or ibuprofen (Advil) as needed.  2. Take your usually prescribed medications unless otherwise directed.  3. If you need a refill on your pain medication, please contact your pharmacy.  They will contact our office to request authorization. Prescriptions will not be filled after 5 pm daily or on weekends.  4. You should follow a light diet the first 24 hours after arrival home, such as soup and crackers or toast.  Be sure to include plenty of fluids daily.  Resume your normal diet the day after surgery.  5. Most patients will experience some swelling and bruising around the surgical site.  Ice packs and reclining will help.  Swelling and bruising can take several days to resolve.   6. It is common to experience some constipation if taking pain medication after surgery.  Increasing fluid intake and taking a stool softener (such as Colace) will usually help or prevent this problem from occurring.  A mild laxative (Milk of Magnesia or Miralax) should be taken according to package directions if there are no bowel movements after 48 hours.  7. Unless discharge instructions indicate otherwise, you may remove your bandages 24-48 hours after surgery, and you may shower at that time.  You may have steri-strips (small skin tapes) in place directly over the incision.  These strips should be left on the skin for 7-10 days.  If your surgeon used skin glue on the incision, you may shower in 24 hours.  The glue will flake off over the next 2-3 weeks.  Any sutures or staples will be removed at the office during your follow-up  visit.  8. ACTIVITIES:  You may resume regular (light) daily activities beginning the next day-such as daily self-care, walking, climbing stairs-gradually increasing activities as tolerated.  You may have sexual intercourse when it is comfortable.  Refrain from any heavy lifting or straining until approved by your doctor.  You may drive when you are no longer taking prescription pain medication, you can comfortably wear a seatbelt, and you can safely maneuver your car and apply brakes.  9. You should see your doctor in the office for a follow-up appointment approximately 2-3 weeks after your surgery.  Make sure that you call for this appointment within a day or two after you arrive home to insure a convenient appointment time. 10.   WHEN TO CALL YOUR DOCTOR: 1. Fever greater than 101.0 2. Inability to urinate 3. Persistent nausea and/or vomiting 4. Extreme swelling or bruising 5. Continued bleeding from incision 6. Increased pain, redness, or drainage from the incision  The clinic staff is available to answer your questions during regular business hours.  Please don't hesitate to call and ask to speak to one of the nurses for clinical concerns.  If you have a medical emergency, go to the nearest emergency room or call 911.  A surgeon from [facility] is always on call for the hospital.   [facility] [address], [address], [place], [place]  [address]  [phone] ? [phone] ? FAX [phone]  [email or web address]   

## 2012-12-12 DIAGNOSIS — L821 Other seborrheic keratosis: Secondary | ICD-10-CM | POA: Diagnosis not present

## 2012-12-12 DIAGNOSIS — D235 Other benign neoplasm of skin of trunk: Secondary | ICD-10-CM | POA: Diagnosis not present

## 2012-12-12 DIAGNOSIS — Z85820 Personal history of malignant melanoma of skin: Secondary | ICD-10-CM | POA: Diagnosis not present

## 2012-12-12 DIAGNOSIS — D485 Neoplasm of uncertain behavior of skin: Secondary | ICD-10-CM | POA: Diagnosis not present

## 2012-12-17 DIAGNOSIS — H4011X0 Primary open-angle glaucoma, stage unspecified: Secondary | ICD-10-CM | POA: Diagnosis not present

## 2012-12-17 DIAGNOSIS — H47239 Glaucomatous optic atrophy, unspecified eye: Secondary | ICD-10-CM | POA: Diagnosis not present

## 2012-12-27 ENCOUNTER — Encounter (HOSPITAL_COMMUNITY): Payer: Self-pay | Admitting: Pharmacy Technician

## 2013-01-01 ENCOUNTER — Encounter (HOSPITAL_COMMUNITY)
Admission: RE | Admit: 2013-01-01 | Discharge: 2013-01-01 | Disposition: A | Discharge status: Home / Self Care | Payer: Medicare Other | Source: Ambulatory Visit | Attending: Surgery | Admitting: Surgery

## 2013-01-01 ENCOUNTER — Encounter (HOSPITAL_COMMUNITY): Payer: Self-pay

## 2013-01-01 ENCOUNTER — Ambulatory Visit (INDEPENDENT_AMBULATORY_CARE_PROVIDER_SITE_OTHER): Payer: Medicare Other

## 2013-01-01 DIAGNOSIS — Z01818 Encounter for other preprocedural examination: Secondary | ICD-10-CM | POA: Diagnosis not present

## 2013-01-01 DIAGNOSIS — Z23 Encounter for immunization: Secondary | ICD-10-CM | POA: Diagnosis not present

## 2013-01-01 DIAGNOSIS — Z01812 Encounter for preprocedural laboratory examination: Secondary | ICD-10-CM | POA: Diagnosis not present

## 2013-01-01 LAB — CBC
HCT: 43.4 % (ref 39.0–52.0)
MCH: 30.7 pg (ref 26.0–34.0)
MCV: 88.9 fL (ref 78.0–100.0)
Platelets: 218 10*3/uL (ref 150–400)
RBC: 4.88 MIL/uL (ref 4.22–5.81)
RDW: 13.3 % (ref 11.5–15.5)

## 2013-01-01 LAB — BASIC METABOLIC PANEL
BUN: 16 mg/dL (ref 6–23)
CO2: 26 mEq/L (ref 19–32)
Calcium: 9.6 mg/dL (ref 8.4–10.5)
Creatinine, Ser: 1.45 mg/dL — ABNORMAL HIGH (ref 0.50–1.35)
GFR calc Af Amer: 57 mL/min — ABNORMAL LOW (ref >90)
GFR calc non Af Amer: 49 mL/min — ABNORMAL LOW (ref >90)
Sodium: 142 mEq/L (ref 135–145)

## 2013-01-01 NOTE — Pre-Procedure Instructions (Signed)
[name] [name].  01/01/2013   Your procedure is scheduled on:  Thursday  01/10/13    Report to [facility]  2 * 3  [id number].  Call this number if you have problems the morning of surgery: [phone]   Remember:   Do not eat food or drink liquids after midnight.   Take these medicines the morning of surgery with A SIP OF WATER:   EYE  DROPS, METOPROLOL(LOPRESSOR) (STOP ASPIRIN, COUMADIN, PLAVIX, EFFIENT, HERBAL MEDICINES)    Do not wear jewelry, make-up or nail polish.  Do not wear lotions, powders, or perfumes. You may wear deodorant.  Do not shave 48 hours prior to surgery. Men may shave face and neck.  Do not bring valuables to the hospital.  [facility] is not responsible                  for any belongings or valuables.               Contacts, dentures or bridgework may not be worn into surgery.  Leave suitcase in the car. After surgery it may be brought to your room.  For patients admitted to the hospital, discharge time is determined by your                treatment team.               Patients discharged the day of surgery will not be allowed to drive  home.  Name and phone number of your driver:   Special Instructions: Shower using CHG 2 nights before surgery and the night before surgery.  If you shower the day of surgery use CHG.  Use special wash - you have one bottle of CHG for all showers.  You should use approximately 1/3 of the bottle for each shower.   Please read over the following fact sheets that you were given: Pain Booklet, Coughing and Deep Breathing and Surgical Site Infection Prevention

## 2013-01-01 NOTE — Progress Notes (Signed)
Quick Note:  These results are acceptable for scheduled surgery.  Quindarrius Joplin M. Chevi Lim, MD, FACS [facility] Office: [phone]   ______ 

## 2013-01-09 MED ORDER — CEFAZOLIN SODIUM-DEXTROSE 2-3 GM-% IV SOLR
2.0000 g | INTRAVENOUS | Status: AC
  Administered 2013-01-10: 2 g via INTRAVENOUS
  Filled 2013-01-09: qty 50

## 2013-01-10 ENCOUNTER — Encounter (HOSPITAL_COMMUNITY): Payer: Medicare Other | Admitting: Certified Registered Nurse Anesthetist

## 2013-01-10 ENCOUNTER — Observation Stay (HOSPITAL_COMMUNITY)
Admission: RE | Admit: 2013-01-10 | Discharge: 2013-01-11 | Disposition: A | Discharge status: Home / Self Care | Payer: Medicare Other | Source: Ambulatory Visit | Attending: Surgery | Admitting: Surgery

## 2013-01-10 ENCOUNTER — Encounter (HOSPITAL_COMMUNITY): Payer: Self-pay | Admitting: *Deleted

## 2013-01-10 ENCOUNTER — Ambulatory Visit (HOSPITAL_COMMUNITY): Payer: Medicare Other | Admitting: Certified Registered Nurse Anesthetist

## 2013-01-10 ENCOUNTER — Encounter (HOSPITAL_COMMUNITY)
Admission: RE | Disposition: A | Discharge status: Home / Self Care | Payer: Self-pay | Source: Ambulatory Visit | Attending: Surgery

## 2013-01-10 DIAGNOSIS — K432 Incisional hernia without obstruction or gangrene: Secondary | ICD-10-CM

## 2013-01-10 DIAGNOSIS — I2510 Atherosclerotic heart disease of native coronary artery without angina pectoris: Secondary | ICD-10-CM | POA: Diagnosis not present

## 2013-01-10 DIAGNOSIS — E785 Hyperlipidemia, unspecified: Secondary | ICD-10-CM | POA: Insufficient documentation

## 2013-01-10 DIAGNOSIS — N289 Disorder of kidney and ureter, unspecified: Secondary | ICD-10-CM | POA: Diagnosis not present

## 2013-01-10 DIAGNOSIS — I10 Essential (primary) hypertension: Secondary | ICD-10-CM | POA: Diagnosis not present

## 2013-01-10 DIAGNOSIS — E119 Type 2 diabetes mellitus without complications: Secondary | ICD-10-CM | POA: Insufficient documentation

## 2013-01-10 DIAGNOSIS — K439 Ventral hernia without obstruction or gangrene: Secondary | ICD-10-CM | POA: Diagnosis not present

## 2013-01-10 HISTORY — PX: INSERTION OF MESH: SHX5868

## 2013-01-10 HISTORY — PX: VENTRAL HERNIA REPAIR: SHX424

## 2013-01-10 SURGERY — REPAIR, HERNIA, VENTRAL
Anesthesia: General | Wound class: Clean

## 2013-01-10 MED ORDER — MIDAZOLAM HCL 5 MG/5ML IJ SOLN
INTRAMUSCULAR | Status: DC | PRN
  Administered 2013-01-10: 2 mg via INTRAVENOUS

## 2013-01-10 MED ORDER — KCL IN DEXTROSE-NACL 20-5-0.45 MEQ/L-%-% IV SOLN
INTRAVENOUS | Status: DC
  Administered 2013-01-10: 12:00:00 via INTRAVENOUS
  Filled 2013-01-10 (×2): qty 1000

## 2013-01-10 MED ORDER — BUPIVACAINE HCL (PF) 0.25 % IJ SOLN
INTRAMUSCULAR | Status: AC
  Filled 2013-01-10: qty 30

## 2013-01-10 MED ORDER — BRIMONIDINE TARTRATE 0.2 % OP SOLN
1.0000 [drp] | Freq: Three times a day (TID) | OPHTHALMIC | Status: DC
  Administered 2013-01-10 – 2013-01-11 (×2): 1 [drp] via OPHTHALMIC
  Filled 2013-01-10: qty 5

## 2013-01-10 MED ORDER — 0.9 % SODIUM CHLORIDE (POUR BTL) OPTIME
TOPICAL | Status: DC | PRN
  Administered 2013-01-10: 1000 mL

## 2013-01-10 MED ORDER — ONDANSETRON HCL 4 MG/2ML IJ SOLN: 4.0000 mg | Freq: Four times a day (QID) | INTRAMUSCULAR | Status: DC | PRN

## 2013-01-10 MED ORDER — ACETAMINOPHEN 325 MG PO TABS: 650.0000 mg | ORAL_TABLET | ORAL | Status: DC | PRN

## 2013-01-10 MED ORDER — HYDROMORPHONE HCL PF 1 MG/ML IJ SOLN
1.0000 mg | INTRAMUSCULAR | Status: DC | PRN
  Administered 2013-01-10 – 2013-01-11 (×3): 1 mg via INTRAVENOUS
  Filled 2013-01-10 (×3): qty 1

## 2013-01-10 MED ORDER — HYDROCODONE-ACETAMINOPHEN 5-325 MG PO TABS
1.0000 | ORAL_TABLET | ORAL | Status: DC | PRN
  Administered 2013-01-10 – 2013-01-11 (×3): 2 via ORAL
  Filled 2013-01-10 (×3): qty 2

## 2013-01-10 MED ORDER — METOPROLOL TARTRATE 25 MG PO TABS
25.0000 mg | ORAL_TABLET | Freq: Two times a day (BID) | ORAL | Status: DC
  Administered 2013-01-10 – 2013-01-11 (×2): 25 mg via ORAL
  Filled 2013-01-10 (×4): qty 1

## 2013-01-10 MED ORDER — ONDANSETRON HCL 4 MG PO TABS: 4.0000 mg | ORAL_TABLET | Freq: Four times a day (QID) | ORAL | Status: DC | PRN

## 2013-01-10 MED ORDER — LISINOPRIL 20 MG PO TABS
20.0000 mg | ORAL_TABLET | Freq: Every day | ORAL | Status: DC
  Administered 2013-01-10 – 2013-01-11 (×2): 20 mg via ORAL
  Filled 2013-01-10 (×2): qty 1

## 2013-01-10 MED ORDER — LACTATED RINGERS IV SOLN
INTRAVENOUS | Status: DC | PRN
  Administered 2013-01-10 (×2): via INTRAVENOUS

## 2013-01-10 MED ORDER — LATANOPROST 0.005 % OP SOLN
1.0000 [drp] | Freq: Every day | OPHTHALMIC | Status: DC
  Administered 2013-01-10: 1 [drp] via OPHTHALMIC
  Filled 2013-01-10: qty 2.5

## 2013-01-10 MED ORDER — ONDANSETRON HCL 4 MG/2ML IJ SOLN
INTRAMUSCULAR | Status: DC | PRN
  Administered 2013-01-10: 4 mg via INTRAVENOUS

## 2013-01-10 MED ORDER — BRIMONIDINE TARTRATE 0.15 % OP SOLN
1.0000 [drp] | Freq: Three times a day (TID) | OPHTHALMIC | Status: DC
  Filled 2013-01-10: qty 5

## 2013-01-10 MED ORDER — LIDOCAINE HCL (CARDIAC) 20 MG/ML IV SOLN
INTRAVENOUS | Status: DC | PRN
  Administered 2013-01-10: 60 mg via INTRAVENOUS

## 2013-01-10 MED ORDER — NEOSTIGMINE METHYLSULFATE 1 MG/ML IJ SOLN
INTRAMUSCULAR | Status: DC | PRN
  Administered 2013-01-10: 3 mg via INTRAVENOUS

## 2013-01-10 MED ORDER — ROCURONIUM BROMIDE 100 MG/10ML IV SOLN
INTRAVENOUS | Status: DC | PRN
  Administered 2013-01-10: 40 mg via INTRAVENOUS

## 2013-01-10 MED ORDER — HYDROMORPHONE HCL PF 1 MG/ML IJ SOLN
INTRAMUSCULAR | Status: AC
  Filled 2013-01-10: qty 1

## 2013-01-10 MED ORDER — FENTANYL CITRATE 0.05 MG/ML IJ SOLN
INTRAMUSCULAR | Status: DC | PRN
  Administered 2013-01-10: 150 ug via INTRAVENOUS
  Administered 2013-01-10: 50 ug via INTRAVENOUS

## 2013-01-10 MED ORDER — HYDROMORPHONE HCL PF 1 MG/ML IJ SOLN
0.2500 mg | INTRAMUSCULAR | Status: DC | PRN
  Administered 2013-01-10 (×2): 0.25 mg via INTRAVENOUS

## 2013-01-10 MED ORDER — LIDOCAINE HCL (PF) 1 % IJ SOLN
INTRAMUSCULAR | Status: AC
  Filled 2013-01-10: qty 30

## 2013-01-10 MED ORDER — GLYCOPYRROLATE 0.2 MG/ML IJ SOLN
INTRAMUSCULAR | Status: DC | PRN
  Administered 2013-01-10: .6 mg via INTRAVENOUS

## 2013-01-10 MED ORDER — PROPOFOL 10 MG/ML IV BOLUS
INTRAVENOUS | Status: DC | PRN
  Administered 2013-01-10: 170 mg via INTRAVENOUS

## 2013-01-10 SURGICAL SUPPLY — 40 items
APL SKNCLS STERI-STRIP NONHPOA (GAUZE/BANDAGES/DRESSINGS) ×1
BENZOIN TINCTURE PRP APPL 2/3 (GAUZE/BANDAGES/DRESSINGS) ×1 IMPLANT
BLADE SURG ROTATE 9660 (MISCELLANEOUS) IMPLANT
CANISTER SUCTION 2500CC (MISCELLANEOUS) ×2 IMPLANT
CHLORAPREP W/TINT 26ML (MISCELLANEOUS) ×2 IMPLANT
COVER SURGICAL LIGHT HANDLE (MISCELLANEOUS) ×2 IMPLANT
DECANTER SPIKE VIAL GLASS SM (MISCELLANEOUS) IMPLANT
DRAIN CHANNEL 19F RND (DRAIN) ×1 IMPLANT
DRAPE LAPAROTOMY T 102X78X121 (DRAPES) ×2 IMPLANT
DRAPE UTILITY 15X26 W/TAPE STR (DRAPE) ×4 IMPLANT
ELECT CAUTERY BLADE 6.4 (BLADE) ×2 IMPLANT
ELECT REM PT RETURN 9FT ADLT (ELECTROSURGICAL) ×2
ELECTRODE REM PT RTRN 9FT ADLT (ELECTROSURGICAL) ×1 IMPLANT
EVACUATOR SILICONE 100CC (DRAIN) ×1 IMPLANT
GLOVE SURG ORTHO 8.0 STRL STRW (GLOVE) ×2 IMPLANT
GOWN STRL NON-REIN LRG LVL3 (GOWN DISPOSABLE) ×4 IMPLANT
GOWN STRL REIN XL XLG (GOWN DISPOSABLE) ×2 IMPLANT
KIT BASIN OR (CUSTOM PROCEDURE TRAY) ×2 IMPLANT
KIT ROOM TURNOVER OR (KITS) ×2 IMPLANT
MESH BARD SOFT 3X6IN (Mesh General) ×1 IMPLANT
NDL HYPO 25GX1X1/2 BEV (NEEDLE) ×1 IMPLANT
NEEDLE HYPO 25GX1X1/2 BEV (NEEDLE) ×2 IMPLANT
NS IRRIG 1000ML POUR BTL (IV SOLUTION) ×2 IMPLANT
PACK GENERAL/GYN (CUSTOM PROCEDURE TRAY) ×2 IMPLANT
PAD ARMBOARD 7.5X6 YLW CONV (MISCELLANEOUS) ×4 IMPLANT
SPONGE GAUZE 4X4 12PLY (GAUZE/BANDAGES/DRESSINGS) ×1 IMPLANT
STAPLER VISISTAT 35W (STAPLE) IMPLANT
STRIP CLOSURE SKIN 1/2X4 (GAUZE/BANDAGES/DRESSINGS) ×1 IMPLANT
SUT ETHILON 3 0 FSL (SUTURE) ×1 IMPLANT
SUT MNCRL AB 4-0 PS2 18 (SUTURE) ×1 IMPLANT
SUT NOV 1 T60/GS (SUTURE) IMPLANT
SUT NOVA NAB DX-16 0-1 5-0 T12 (SUTURE) ×2 IMPLANT
SUT NOVA NAB GS-21 0 18 T12 DT (SUTURE) ×2 IMPLANT
SUT VIC AB 2-0 SH 18 (SUTURE) ×1 IMPLANT
SYR CONTROL 10ML LL (SYRINGE) ×2 IMPLANT
TAPE CLOTH SURG 6X10 WHT LF (GAUZE/BANDAGES/DRESSINGS) ×1 IMPLANT
TOWEL OR 17X24 6PK STRL BLUE (TOWEL DISPOSABLE) ×2 IMPLANT
TOWEL OR 17X26 10 PK STRL BLUE (TOWEL DISPOSABLE) ×2 IMPLANT
TRAY FOLEY CATH 14FRSI W/METER (CATHETERS) IMPLANT
WATER STERILE IRR 1000ML POUR (IV SOLUTION) IMPLANT

## 2013-01-10 NOTE — Preoperative (Signed)
Beta Blockers   Reason not to administer Beta Blockers:Not Applicable, Pt took Metoprolol at 0400 today

## 2013-01-10 NOTE — Transfer of Care (Signed)
Immediate Anesthesia Transfer of Care Note  Patient: [name] [name].  Procedure(s) Performed: Procedure(s): HERNIA REPAIR VENTRAL ADULT  (N/A) INSERTION OF MESH (N/A)  Patient Location: PACU  Anesthesia Type:General  Level of Consciousness: awake, alert , oriented and patient cooperative  Airway & Oxygen Therapy: Patient Spontanous Breathing and Patient connected to nasal cannula oxygen  Post-op Assessment: Report given to PACU RN, Post -op Vital signs reviewed and stable and Patient moving all extremities X 4  Post vital signs: Reviewed and stable  Complications: No apparent anesthesia complications

## 2013-01-10 NOTE — Anesthesia Postprocedure Evaluation (Signed)
  Anesthesia Post-op Note  Patient: [name] [name].  Procedure(s) Performed: Procedure(s): HERNIA REPAIR VENTRAL ADULT  (N/A) INSERTION OF MESH (N/A)  Patient Location: PACU  Anesthesia Type:General  Level of Consciousness: awake  Airway and Oxygen Therapy: Patient Spontanous Breathing  Post-op Pain: mild  Post-op Assessment: Post-op Vital signs reviewed  Post-op Vital Signs: Reviewed  Complications: No apparent anesthesia complications

## 2013-01-10 NOTE — Brief Op Note (Signed)
01/10/2013  9:03 AM  PATIENT:  [name] [name].  67 y.o. male  PRE-OPERATIVE DIAGNOSIS:  ventral incisional hernia  POST-OPERATIVE DIAGNOSIS:  Same  PROCEDURE:  Procedure(s): HERNIA REPAIR VENTRAL ADULT  (N/A) INSERTION OF MESH (N/A)  SURGEON:  Surgeon(s) and Role:    * Velora Heckler, MD - Primary  ANESTHESIA:   general  EBL:  Total I/O In: 1000 [I.V.:1000] Out: -   BLOOD ADMINISTERED:none  DRAINS: (19Fr) Blake drain(s) in the subcutaneous space   LOCAL MEDICATIONS USED:  NONE  SPECIMEN:  No Specimen  DISPOSITION OF SPECIMEN:  N/A  COUNTS:  YES  TOURNIQUET:  * No tourniquets in log *  DICTATION: .Other Dictation: Dictation Number [id number]  PLAN OF CARE: Admit for overnight observation  PATIENT DISPOSITION:  PACU - hemodynamically stable.   Delay start of Pharmacological VTE agent (>24hrs) due to surgical blood loss or risk of bleeding: yes  Velora Heckler, MD, [facility] Office: [phone]

## 2013-01-10 NOTE — Anesthesia Procedure Notes (Signed)
Procedure Name: Intubation Date/Time: 01/10/2013 7:41 AM Performed by: Rogelia Boga Pre-anesthesia Checklist: Patient identified, Emergency Drugs available, Suction available, Patient being monitored and Timeout performed Patient Re-evaluated:Patient Re-evaluated prior to inductionOxygen Delivery Method: Circle system utilized Preoxygenation: Pre-oxygenation with 100% oxygen Intubation Type: IV induction Ventilation: Mask ventilation without difficulty and Oral airway inserted - appropriate to patient size Laryngoscope Size: Mac and 4 Grade View: Grade II Tube type: Oral Tube size: 7.5 mm Number of attempts: 1 Airway Equipment and Method: Stylet Placement Confirmation: ETT inserted through vocal cords under direct vision,  positive ETCO2 and breath sounds checked- equal and bilateral Secured at: 21 cm Tube secured with: Tape Dental Injury: Teeth and Oropharynx as per pre-operative assessment

## 2013-01-10 NOTE — Anesthesia Preprocedure Evaluation (Addendum)
Anesthesia Evaluation  Patient identified by MRN, date of birth, ID band Patient awake    Reviewed: Allergy & Precautions, H&P , NPO status , Patient's Chart, lab work & pertinent test results, reviewed documented beta blocker date and time   Airway Mallampati: II TM Distance: >3 FB     Dental  (+) Teeth Intact and Dental Advisory Given   Pulmonary neg pulmonary ROS, former smoker,  breath sounds clear to auscultation        Cardiovascular hypertension, Pt. on medications and Pt. on home beta blockers + CAD Rhythm:Regular Rate:Normal     Neuro/Psych    GI/Hepatic Neg liver ROS,   Endo/Other  diabetes  Renal/GU Renal disease     Musculoskeletal   Abdominal   Peds  Hematology   Anesthesia Other Findings Pt denies CAD, DM, and also denies renal disease. Does not know how these got into his records   Reproductive/Obstetrics                         Anesthesia Physical Anesthesia Plan  ASA: III  Anesthesia Plan: General   Post-op Pain Management:    Induction:   Airway Management Planned: Oral ETT  Additional Equipment:   Intra-op Plan:   Post-operative Plan:   Informed Consent: I have reviewed the patients History and Physical, chart, labs and discussed the procedure including the risks, benefits and alternatives for the proposed anesthesia with the patient or authorized representative who has indicated his/her understanding and acceptance.   Dental advisory given  Plan Discussed with: CRNA, Anesthesiologist and Surgeon  Anesthesia Plan Comments:       Anesthesia Quick Evaluation

## 2013-01-10 NOTE — H&P (Signed)
[name] [name]. is an 67 y.o. male.    General Surgery [facility]  Chief Complaint: incisional hernia  HPI: Patient is a 67 year old male who underwent umbilical hernia repair with mesh patch on 11/01/2011. Patient was seen back postoperatively and did well. Over the past year he has remained active. However, within the past several weeks, he has noted a progressive bulge at the umbilicus. This causes minor discomfort. He denies any signs or symptoms of obstruction. He presents today for repair.  Past Medical History  Diagnosis Date  . HYPERLIPIDEMIA 09/26/2006  . GOUT 05/16/2007  . HYPERTENSION 04/13/2007  . DIVERTICULOSIS, COLON 04/13/2007  . JOINT EFFUSION, KNEE 06/15/2009  . ELEVATED PROSTATE SPECIFIC ANTIGEN 04/14/2007  . ABNORMAL EKG 10/07/2008  . ADVEF, DRUG/MED/BIOL SUBST, OTHER DRUG NOS 09/26/2006  . PROSTATE CANCER 10/05/2009    sees Dr. Annabell Howells  . Melanoma   . Glaucoma     [facility]   . CORONARY ARTERY DISEASE 10/05/2009    PATIENT DENIES, SEVERAL NEG STRESS TEST  . Diabetes mellitus     type 2, diet controlled   . Renal insufficiency     RESOLVED PER PATIENT  . DEGENERATIVE JOINT DISEASE, MODERATE 04/13/2007    Past Surgical History  Procedure Laterality Date  . Vasectomy    . Tonsilectomy, adenoidectomy, bilateral myringotomy and tubes    . Bunionectomy      bilateral  . Prostatectomy      per Dr. Annabell Howells  . Cystectomy      benign from hand  . Colonoscopy  2007    at [facility], clear, repeat in 10 yrs   . Melanoma excision  2012    per Dr. Nita Sells  . Knee arthroscopy  Jan. 2013    right knee, per Dr. [name]   . Umbilical hernia repair  11/01/2011    Procedure: HERNIA REPAIR UMBILICAL ADULT;  Surgeon: Velora Heckler, MD;  Location: [facility];  Service: General;  Laterality: N/A;  Repair umbilical hernia with mesh patch  . Glaucoma surgery Bilateral 2014     Family History  Problem Relation Age of Onset  .  Alcohol abuse Mother   . Stroke Mother   . Stroke Father    Social History:  reports that he quit smoking about 40 years ago. He has never used smokeless tobacco. He reports that he drinks alcohol. He reports that he does not use illicit drugs.  Allergies: No Known Allergies  Medications Prior to Admission  Medication Sig Dispense Refill  . aspirin EC 81 MG tablet Take 81 mg by mouth daily.      . bimatoprost (LUMIGAN) 0.01 % SOLN Place 1 drop into both eyes at bedtime.      . brimonidine (ALPHAGAN P) 0.1 % SOLN Place 1 drop into both eyes 2 (two) times daily.       . Coenzyme Q10 (COQ-10) 100 MG CAPS Take 100 mg by mouth daily.      . fenofibrate 160 MG tablet Take 1 tablet (160 mg total) by mouth daily.  90 tablet  3  . lisinopril (PRINIVIL,ZESTRIL) 20 MG tablet Take 1 tablet (20 mg total) by mouth daily.  90 tablet  3  . metoprolol (LOPRESSOR) 50 MG tablet Take 0.5 tablets (25 mg total) by mouth 2 (two) times daily.  1 tablet  0  . Multiple Vitamins-Minerals (MULTIVITAMIN WITH MINERALS) tablet Take 1 tablet by mouth 2 (two)  times a week. Sundays and wednesdays      . rosuvastatin (CRESTOR) 20 MG tablet Take 10 mg by mouth daily.      . tadalafil (CIALIS) 20 MG tablet Take 1 tablet (20 mg total) by mouth daily as needed.  18 tablet  3    No results found for this or any previous visit (from the past 48 hour(s)). No results found.  Review of Systems  Constitutional: Negative.   HENT: Negative.   Eyes: Negative.   Respiratory: Negative.   Cardiovascular: Negative.   Gastrointestinal: Negative.   Genitourinary: Negative.   Musculoskeletal:       Hernia at umbilicus  Skin: Negative.   Neurological: Negative.   Endo/Heme/Allergies: Negative.   Psychiatric/Behavioral: Negative.     Blood pressure 125/77, pulse 47, temperature 97.6 F (36.4 C), temperature source Oral, resp. rate 20, SpO2 99.00%. Physical Exam  Constitutional: He is oriented to person, place, and time. He  appears well-developed and well-nourished. No distress.  HENT:  Head: Normocephalic and atraumatic.  Right Ear: External ear normal.  Left Ear: External ear normal.  Eyes: Conjunctivae are normal. Pupils are equal, round, and reactive to light. No scleral icterus.  Neck: Normal range of motion. Neck supple. No thyromegaly present.  Cardiovascular: Normal rate, regular rhythm and normal heart sounds.   No murmur heard. Respiratory: Effort normal and breath sounds normal. He has no wheezes.  GI: Soft. Bowel sounds are normal. He exhibits no distension. There is no tenderness.  Incisional hernia at umbilicus, well-healed incision, reducible, non-tender  Musculoskeletal: Normal range of motion. He exhibits no edema.  Lymphadenopathy:    He has no cervical adenopathy.  Neurological: He is alert and oriented to person, place, and time.  Skin: Skin is warm and dry.  Psychiatric: He has a normal mood and affect. His behavior is normal.     Assessment/Plan Incisional hernia at umbilicus  Plan open repair with mesh  The risks and benefits of the procedure have been discussed at length with the patient.  The patient understands the proposed procedure, potential alternative treatments, and the course of recovery to be expected.  All of the patient's questions have been answered at this time.  The patient wishes to proceed with surgery.  Velora Heckler, MD, [facility] Office: [phone]    Pelagia Iacobucci Judie Petit 01/10/2013, 7:22 AM

## 2013-01-11 ENCOUNTER — Telehealth (INDEPENDENT_AMBULATORY_CARE_PROVIDER_SITE_OTHER): Payer: Self-pay

## 2013-01-11 ENCOUNTER — Encounter (HOSPITAL_COMMUNITY): Payer: Self-pay | Admitting: Surgery

## 2013-01-11 MED ORDER — HYDROCODONE-ACETAMINOPHEN 5-325 MG PO TABS: 1.0000 | ORAL_TABLET | ORAL | Status: DC | PRN

## 2013-01-11 NOTE — Telephone Encounter (Signed)
Pt home doing well. PO appt made. 

## 2013-01-11 NOTE — Op Note (Signed)
NAMEKAILIN, LEU NO.:  [id number]  MEDICAL RECORD NO.:  [id number]  LOCATION:  6N28C                        FACILITY:  [facility]  PHYSICIAN:  Velora Heckler, MD      DATE OF BIRTH:  [birth year]  DATE OF PROCEDURE:  01/10/2013                               OPERATIVE REPORT   PREOPERATIVE DIAGNOSIS:  Ventral incisional hernia.  POSTOPERATIVE DIAGNOSIS:  Ventral incisional hernia.  PROCEDURE:  Repair of ventral incisional hernia with mesh.  SURGEON:  Velora Heckler, MD, FACS  ANESTHESIA:  General.  ESTIMATED BLOOD LOSS:  Minimal.  PREPARATION:  ChloraPrep.  COMPLICATIONS:  None.  INDICATIONS:  The patient is a 67 year old male, who underwent umbilical hernia repair with mesh patch on November 01, 2011.  Over the past year, the patient has developed a progressive bulge at the umbilicus.  This causes minor discomfort.  On physical examination, he has a recurrence of his hernia at the level of the umbilicus.  The patient now comes to Surgery for repair.  BODY OF REPORT:  Procedures done in OR #1 at the [facility].  The patient was brought to the operating room, placed in supine position on the operating room table.  Following administration of general anesthesia, the patient was positioned and then prepped and draped in the usual aseptic fashion.  After ascertaining that an adequate level of anesthesia had been achieved, the previous transverse incision just below the umbilicus was reopened with a #10 blade and extended laterally on both sides.  Dissection was carried through subcutaneous tissues and scar tissue.  Hernia sac was opened.  It contains omentum.  Hernia sac was dissected out circumferentially.  It was separated from the underside of the umbilicus carefully.  The mesh appears to have been extruded through the hernia defect and forms part of the wall of the hernia sac.  The entire hernia sac and the previously placed mesh were  excised in their entirety.  There was approximately a 3 cm in diameter fascial defect.  This was dissected out circumferentially.  It was then closed transversely with interrupted #1 Novafil simple sutures.  A 3 x 6 inch sheet of Bard soft mesh was trimmed to the appropriate dimensions and placed as an onlay over the fascial repair.  Flaps were developed circumferentially and hemostasis achieved with the electrocautery.  Mesh was secured to the anterior fascia with interrupted #0 Novafil and #1 Novafil sutures.  A #19 Blake drain was placed in the subcutaneous space and exited through the skin in the left lower quadrant of the abdominal wall.  Drain was secured to the skin with a #2-0 nylon suture.  Subcutaneous tissues were reapproximated with interrupted #2-0 Vicryl sutures.  Skin was closed with a running #4-0 Monocryl subcuticular suture.  Wound was washed and dried.  Benzoin and Steri-Strips were applied.  Sterile dressings were applied.  Drain was placed to bulb suction.  The patient was awakened from anesthesia and brought to the recovery room.  The patient tolerated the procedure well.   Velora Heckler, MD, [facility] Office: [phone]    TMG/MEDQ  D:  01/10/2013  T:  01/11/2013  Job:  [id number]

## 2013-01-11 NOTE — Discharge Summary (Signed)
Physician Discharge Summary [facility]  Patient ID: [name] [name]. MRN: [id number] DOB/AGE: Oct 01, 1945 67 y.o.  Admit date: 01/10/2013 Discharge date: 01/11/2013  Admission Diagnoses:  Ventral incisional hernia  Discharge Diagnoses:  Principal Problem:   Incisional hernia   Discharged Condition: good  Hospital Course: patient admitted for observation after repair of ventral hernia with mesh.  Post op course uncomplicated.  Prepared for discharge on POD#1.  Consults: None  Significant Diagnostic Studies: none  Treatments: surgery: open repair of ventral incisional hernia with mesh  Discharge Exam: Blood pressure 108/81, pulse 61, temperature 97.9 F (36.6 C), temperature source Oral, resp. rate 18, height 5\' 6"  (1.676 m), weight 205 lb 11 oz (93.3 kg), SpO2 97.00%. HEENT - clear Neck - soft Chest - clear bilaterally Cor - RRR Abd - dressings dry and intact; serosanguinous in JP drain  Disposition: Home with family  Discharge Orders   Future Appointments Provider Department Dept Phone   01/29/2013 2:00 PM Velora Heckler, MD [facility] [place] [phone]   04/26/2013 8:30 AM Nelwyn Salisbury, MD  [facility] [phone]   Future Orders Complete By Expires   Diet - low sodium heart healthy  As directed    Discharge instructions  As directed    Comments:     [facility]  HERNIA REPAIR POST OP INSTRUCTIONS  Always review your discharge instruction sheet given to you by the facility where your surgery was performed.  A  prescription for pain medication may be given to you upon discharge.  Take your pain medication as prescribed.  If narcotic pain medicine is not needed, then you may take acetaminophen (Tylenol) or ibuprofen (Advil) as needed.  Take your usually prescribed medications unless otherwise directed.  If you need a refill on your pain medication, please contact your pharmacy.  They will  contact our office to request authorization. Prescriptions will not be filled after 5 pm daily or on weekends.  You should follow a light diet the first 24 hours after arrival home, such as soup and crackers or toast.  Be sure to include plenty of fluids daily.  Resume your normal diet the day after surgery.  Most patients will experience some swelling and bruising around the surgical site.  Ice packs and reclining will help.  Swelling and bruising can take several days to resolve.   It is common to experience some constipation if taking pain medication after surgery.  Increasing fluid intake and taking a stool softener (such as Colace) will usually help or prevent this problem from occurring.  A mild laxative (Milk of Magnesia or Miralax) should be taken according to package directions if there are no bowel movements after 48 hours.  Unless discharge instructions indicate otherwise, you may remove your bandages 24-48 hours after surgery, and you may shower at that time.  You may have steri-strips (small skin tapes) in place directly over the incision.  These strips should be left on the skin for 7-10 days.  If your surgeon used skin glue on the incision, you may shower in 24 hours.  The glue will flake off over the next 2-3 weeks.  Any sutures or staples will be removed at the office during your follow-up visit.  ACTIVITIES:  You may resume regular (light) daily activities beginning the next day-such as daily self-care, walking, climbing stairs-gradually increasing activities as tolerated.  You may have sexual intercourse when it is comfortable.  Refrain from any heavy  lifting or straining until approved by your doctor.  You may drive when you are no longer taking prescription pain medication, you can comfortably wear a seatbelt, and you can safely maneuver your car and apply brakes.  You should see your doctor in the office for a follow-up appointment approximately 2-3 weeks after your surgery.  Make  sure that you call for this appointment within a day or two after you arrive home to insure a convenient appointment time.   WHEN TO CALL YOUR DOCTOR: Fever greater than 101.0 Inability to urinate Persistent nausea and/or vomiting Extreme swelling or bruising Continued bleeding from incision Increased pain, redness, or drainage from the incision  The clinic staff is available to answer your questions during regular business hours.  Please don't hesitate to call and ask to speak to one of the nurses for clinical concerns.  If you have a medical emergency, go to the nearest emergency room or call 911.  A surgeon from [facility] is always on call for the hospital.   [facility] [address], [address], [place], [place]  [address]  [phone] ? [phone] ? FAX [phone]  [email or web address]   Increase activity slowly  As directed    Remove dressing in 24 hours  As directed    Comments:     Continue drain care as instructed.  Leave Steri-strips one week.       Medication List         aspirin EC 81 MG tablet  Take 81 mg by mouth daily.     bimatoprost 0.01 % Soln  Commonly known as:  LUMIGAN  Place 1 drop into both eyes at bedtime.     brimonidine 0.1 % Soln  Commonly known as:  ALPHAGAN P  Place 1 drop into both eyes 2 (two) times daily.     CoQ-10 100 MG Caps  Take 100 mg by mouth daily.     fenofibrate 160 MG tablet  Take 1 tablet (160 mg total) by mouth daily.     HYDROcodone-acetaminophen 5-325 MG per tablet  Commonly known as:  NORCO/VICODIN  Take 1-2 tablets by mouth every 4 (four) hours as needed for moderate pain.     lisinopril 20 MG tablet  Commonly known as:  PRINIVIL,ZESTRIL  Take 1 tablet (20 mg total) by mouth daily.     metoprolol 50 MG tablet  Commonly known as:  LOPRESSOR  Take 0.5 tablets (25 mg total) by mouth 2 (two) times daily.     multivitamin with minerals tablet  Take 1  tablet by mouth 2 (two) times a week. Sundays and wednesdays     rosuvastatin 20 MG tablet  Commonly known as:  CRESTOR  Take 10 mg by mouth daily.     tadalafil 20 MG tablet  Commonly known as:  CIALIS  Take 1 tablet (20 mg total) by mouth daily as needed.         Velora Heckler, MD, [facility] Office: [phone]   Signed: Velora Heckler 01/11/2013, 12:53 PM

## 2013-01-11 NOTE — Progress Notes (Signed)
Patient discharged to home with instructions. 

## 2013-01-14 ENCOUNTER — Encounter (INDEPENDENT_AMBULATORY_CARE_PROVIDER_SITE_OTHER): Payer: Self-pay | Admitting: Surgery

## 2013-01-14 ENCOUNTER — Ambulatory Visit (INDEPENDENT_AMBULATORY_CARE_PROVIDER_SITE_OTHER): Payer: Medicare Other | Admitting: Surgery

## 2013-01-14 VITALS — BP 140/62 | HR 68 | Resp 16 | Ht 66.5 in | Wt 192.2 lb

## 2013-01-14 DIAGNOSIS — K432 Incisional hernia without obstruction or gangrene: Secondary | ICD-10-CM

## 2013-01-14 NOTE — Progress Notes (Signed)
General Surgery [facility]  Chief Complaint  Patient presents with  . Follow-up    po drain check - incisional hernia repair 01/10/2013    HISTORY: The patient is a 67 year old male who underwent incisional hernia repair with mesh on 01/10/2013. History output has diminished to less than 30 cc per day. He presents today for wound check and drain removal.  EXAM: Surgical wound is healing nicely. No sign of seroma. No sign of infection. Drain is removed without difficulty and dry gauze dressing is placed.  IMPRESSION: Status post incisional hernia repair with mesh  PLAN: Wound care instructions are given. Patient will return in 2-3 weeks for wound check. I have given him a prescription for an abdominal binder to wear for comfort.  Velora Heckler, MD, FACS General & Endocrine Surgery [facility]   Visit Diagnoses: 1. Incisional hernia

## 2013-01-14 NOTE — Patient Instructions (Signed)
  COCOA BUTTER & VITAMIN E CREAM  ([employer]mer[employer] or other brand)  Apply cocoa butter/vitamin E cream to your incision 2 - 3 times daily.  Massage cream into incision for one minute with each application.  Use sunscreen (50 SPF or higher) for first 6 months after surgery if area is exposed to sun.  You may substitute Mederma or other scar reducing creams as desired.   

## 2013-01-29 ENCOUNTER — Ambulatory Visit (INDEPENDENT_AMBULATORY_CARE_PROVIDER_SITE_OTHER): Payer: Medicare Other | Admitting: Surgery

## 2013-01-29 ENCOUNTER — Encounter (INDEPENDENT_AMBULATORY_CARE_PROVIDER_SITE_OTHER): Payer: Self-pay | Admitting: Surgery

## 2013-01-29 VITALS — BP 128/72 | HR 64 | Resp 18 | Ht 67.0 in | Wt 198.0 lb

## 2013-01-29 DIAGNOSIS — K432 Incisional hernia without obstruction or gangrene: Secondary | ICD-10-CM

## 2013-01-29 NOTE — Patient Instructions (Signed)
  COCOA BUTTER & VITAMIN E CREAM  ([employer]mer[employer] or other brand)  Apply cocoa butter/vitamin E cream to your incision 2 - 3 times daily.  Massage cream into incision for one minute with each application.  Use sunscreen (50 SPF or higher) for first 6 months after surgery if area is exposed to sun.  You may substitute Mederma or other scar reducing creams as desired.   

## 2013-01-29 NOTE — Progress Notes (Signed)
General Surgery [facility]  Chief Complaint  Patient presents with  . Routine Post Op    ventral incisional hernia repair with mesh 01/10/2013    HISTORY: Patient is a 67 year old male who underwent repair of ventral incisional hernia with mesh on 01/10/2013. He returns today for wound check. He is wearing an abdominal binder that he purchased after his last office visit. He has noted a small amount of drainage from the site of the percutaneous drain.  EXAM: Wound is healing nicely. Remaining Steri-Strips are removed. No sign of seroma. No sign of infection. No sign of recurrent hernia. Drain site is dry but skin edges are still slightly separated.  IMPRESSION: Status post ventral incisional hernia repair with mesh  PLAN: Patient will continue aerobic exercise. He is restricted to 10 pounds lifting. He will return for a final wound check in 6 weeks.  Velora Heckler, MD, FACS General & Endocrine Surgery [facility]   Visit Diagnoses: 1. Incisional hernia

## 2013-03-13 ENCOUNTER — Ambulatory Visit (INDEPENDENT_AMBULATORY_CARE_PROVIDER_SITE_OTHER): Payer: Medicare Other | Admitting: Surgery

## 2013-03-13 ENCOUNTER — Encounter (INDEPENDENT_AMBULATORY_CARE_PROVIDER_SITE_OTHER): Payer: Self-pay | Admitting: Surgery

## 2013-03-13 VITALS — BP 130/68 | HR 66 | Temp 97.2°F | Resp 18 | Ht 66.0 in | Wt 204.0 lb

## 2013-03-13 DIAGNOSIS — K432 Incisional hernia without obstruction or gangrene: Secondary | ICD-10-CM

## 2013-03-13 NOTE — Patient Instructions (Signed)
  COCOA BUTTER & VITAMIN E CREAM  ([employer]mer[employer] or other brand)  Apply cocoa butter/vitamin E cream to your incision 2 - 3 times daily.  Massage cream into incision for one minute with each application.  Use sunscreen (50 SPF or higher) for first 6 months after surgery if area is exposed to sun.  You may substitute Mederma or other scar reducing creams as desired.   

## 2013-03-13 NOTE — Progress Notes (Signed)
General Surgery [facility]  Chief Complaint  Patient presents with  . Routine Post Op    ventral incisional hernia repair 01/10/2013    HISTORY: Patient is a 68 year old male who underwent ventral incisional hernia repair with mesh on 01/10/2013. He returns today for final wound check. He is doing very well. He is anxious to return to swimming and exercise.  EXAM: Abdominal incisions are completely epithelialized. Minimal soft tissue swelling. With Valsalva there is no sign of recurrence.  IMPRESSION: Status post ventral incisional hernia repair with mesh  PLAN: Patient is released to full activity without restriction.  Patient will return for surgical care as needed.  Earnstine Regal, MD, [facility]   Visit Diagnoses: 1. Incisional hernia

## 2013-04-17 DIAGNOSIS — C61 Malignant neoplasm of prostate: Secondary | ICD-10-CM | POA: Diagnosis not present

## 2013-04-23 ENCOUNTER — Ambulatory Visit: Payer: Medicare Other | Admitting: Family Medicine

## 2013-04-24 DIAGNOSIS — N393 Stress incontinence (female) (male): Secondary | ICD-10-CM | POA: Diagnosis not present

## 2013-04-24 DIAGNOSIS — C61 Malignant neoplasm of prostate: Secondary | ICD-10-CM | POA: Diagnosis not present

## 2013-04-24 DIAGNOSIS — N529 Male erectile dysfunction, unspecified: Secondary | ICD-10-CM | POA: Diagnosis not present

## 2013-04-26 ENCOUNTER — Ambulatory Visit: Payer: Medicare Other | Admitting: Family Medicine

## 2013-05-01 ENCOUNTER — Ambulatory Visit (INDEPENDENT_AMBULATORY_CARE_PROVIDER_SITE_OTHER): Payer: Medicare Other | Admitting: Family Medicine

## 2013-05-01 ENCOUNTER — Encounter: Payer: Self-pay | Admitting: Family Medicine

## 2013-05-01 VITALS — BP 120/80 | HR 66 | Temp 98.6°F | Ht 66.0 in | Wt 200.0 lb

## 2013-05-01 DIAGNOSIS — E785 Hyperlipidemia, unspecified: Secondary | ICD-10-CM | POA: Diagnosis not present

## 2013-05-01 DIAGNOSIS — E119 Type 2 diabetes mellitus without complications: Secondary | ICD-10-CM

## 2013-05-01 DIAGNOSIS — N289 Disorder of kidney and ureter, unspecified: Secondary | ICD-10-CM | POA: Diagnosis not present

## 2013-05-01 DIAGNOSIS — I10 Essential (primary) hypertension: Secondary | ICD-10-CM

## 2013-05-01 LAB — HEPATIC FUNCTION PANEL
ALT: 25 U/L (ref 0–53)
AST: 25 U/L (ref 0–37)
Albumin: 4.6 g/dL (ref 3.5–5.2)
Alkaline Phosphatase: 38 U/L — ABNORMAL LOW (ref 39–117)
Bilirubin, Direct: 0.1 mg/dL (ref 0.0–0.3)
Total Bilirubin: 0.7 mg/dL (ref 0.3–1.2)
Total Protein: 7.5 g/dL (ref 6.0–8.3)

## 2013-05-01 LAB — BASIC METABOLIC PANEL
BUN: 18 mg/dL (ref 6–23)
CO2: 26 mEq/L (ref 19–32)
Calcium: 9.7 mg/dL (ref 8.4–10.5)
Chloride: 109 mEq/L (ref 96–112)
Creatinine, Ser: 1.5 mg/dL (ref 0.4–1.5)
GFR: 49.46 mL/min — AB (ref >60.00)
GLUCOSE: 89 mg/dL (ref 70–99)
POTASSIUM: 5 meq/L (ref 3.5–5.1)
SODIUM: 143 meq/L (ref 135–145)

## 2013-05-01 LAB — LIPID PANEL
Cholesterol: 148 mg/dL (ref 0–200)
HDL: 43.9 mg/dL (ref >39.00)
LDL Cholesterol: 72 mg/dL (ref 0–99)
Total CHOL/HDL Ratio: 3
Triglycerides: 159 mg/dL — ABNORMAL HIGH (ref 0.0–149.0)
VLDL: 31.8 mg/dL (ref 0.0–40.0)

## 2013-05-01 LAB — HEMOGLOBIN A1C: Hgb A1c MFr Bld: 6 % (ref 4.6–6.5)

## 2013-05-01 NOTE — Progress Notes (Signed)
   Subjective:    Patient ID: [name] Howells., male    DOB: Jan 11, 1946, 68 y.o.   MRN: [id number]  HPI Here to follow up. He feels great. He has healed form his hernia surgery and is back to his normal exercise routine. He swims 5 days a week. He asks if we can cut back on his BP meds. This is quite stable art home. He has totally changed his diet and is now on the [place].    Review of Systems  Constitutional: Negative.   Respiratory: Negative.   Cardiovascular: Negative.        Objective:   Physical Exam  Constitutional: He appears well-developed and well-nourished.  Cardiovascular: Normal rate, regular rhythm, normal heart sounds and intact distal pulses.   Pulmonary/Chest: Effort normal and breath sounds normal.          Assessment & Plan:  Get fasting labs. We will stop the metoprolol and he will follow the BP at home.

## 2013-05-01 NOTE — Progress Notes (Signed)
Pre visit review using our clinic review tool, if applicable. No additional management support is needed unless otherwise documented below in the visit note. 

## 2013-05-03 ENCOUNTER — Telehealth: Payer: Self-pay | Admitting: Family Medicine

## 2013-05-03 NOTE — Telephone Encounter (Signed)
Relevant patient education assigned to patient using Emmi. ° °

## 2013-06-12 DIAGNOSIS — C4359 Malignant melanoma of other part of trunk: Secondary | ICD-10-CM | POA: Diagnosis not present

## 2013-06-12 DIAGNOSIS — D235 Other benign neoplasm of skin of trunk: Secondary | ICD-10-CM | POA: Diagnosis not present

## 2013-06-12 DIAGNOSIS — L570 Actinic keratosis: Secondary | ICD-10-CM | POA: Diagnosis not present

## 2013-06-12 DIAGNOSIS — Z85820 Personal history of malignant melanoma of skin: Secondary | ICD-10-CM | POA: Diagnosis not present

## 2013-06-17 DIAGNOSIS — H4011X0 Primary open-angle glaucoma, stage unspecified: Secondary | ICD-10-CM | POA: Diagnosis not present

## 2013-06-27 DIAGNOSIS — C4359 Malignant melanoma of other part of trunk: Secondary | ICD-10-CM | POA: Diagnosis not present

## 2013-10-14 DIAGNOSIS — C61 Malignant neoplasm of prostate: Secondary | ICD-10-CM | POA: Diagnosis not present

## 2013-10-17 DIAGNOSIS — D235 Other benign neoplasm of skin of trunk: Secondary | ICD-10-CM | POA: Diagnosis not present

## 2013-10-17 DIAGNOSIS — Z85820 Personal history of malignant melanoma of skin: Secondary | ICD-10-CM | POA: Diagnosis not present

## 2013-10-21 DIAGNOSIS — C61 Malignant neoplasm of prostate: Secondary | ICD-10-CM | POA: Diagnosis not present

## 2013-10-21 DIAGNOSIS — N393 Stress incontinence (female) (male): Secondary | ICD-10-CM | POA: Diagnosis not present

## 2013-10-21 DIAGNOSIS — N529 Male erectile dysfunction, unspecified: Secondary | ICD-10-CM | POA: Diagnosis not present

## 2013-10-29 ENCOUNTER — Encounter: Payer: Self-pay | Admitting: Family Medicine

## 2013-10-29 ENCOUNTER — Ambulatory Visit (INDEPENDENT_AMBULATORY_CARE_PROVIDER_SITE_OTHER): Payer: Medicare Other | Admitting: Family Medicine

## 2013-10-29 VITALS — BP 131/74 | HR 60 | Temp 98.6°F | Ht 66.0 in | Wt 188.0 lb

## 2013-10-29 DIAGNOSIS — N289 Disorder of kidney and ureter, unspecified: Secondary | ICD-10-CM

## 2013-10-29 DIAGNOSIS — Z23 Encounter for immunization: Secondary | ICD-10-CM | POA: Diagnosis not present

## 2013-10-29 DIAGNOSIS — I2510 Atherosclerotic heart disease of native coronary artery without angina pectoris: Secondary | ICD-10-CM | POA: Diagnosis not present

## 2013-10-29 DIAGNOSIS — I10 Essential (primary) hypertension: Secondary | ICD-10-CM

## 2013-10-29 DIAGNOSIS — E785 Hyperlipidemia, unspecified: Secondary | ICD-10-CM

## 2013-10-29 DIAGNOSIS — C61 Malignant neoplasm of prostate: Secondary | ICD-10-CM

## 2013-10-29 DIAGNOSIS — M109 Gout, unspecified: Secondary | ICD-10-CM

## 2013-10-29 LAB — CBC WITH DIFFERENTIAL/PLATELET
Basophils Absolute: 0.1 10*3/uL (ref 0.0–0.1)
Basophils Relative: 0.7 % (ref 0.0–3.0)
EOS PCT: 1.6 % (ref 0.0–5.0)
Eosinophils Absolute: 0.1 10*3/uL (ref 0.0–0.7)
HCT: 43.7 % (ref 39.0–52.0)
Hemoglobin: 14.5 g/dL (ref 13.0–17.0)
LYMPHS ABS: 1.9 10*3/uL (ref 0.7–4.0)
LYMPHS PCT: 24.7 % (ref 12.0–46.0)
MCHC: 33.2 g/dL (ref 30.0–36.0)
MCV: 89.8 fl (ref 78.0–100.0)
MONOS PCT: 7.2 % (ref 3.0–12.0)
Monocytes Absolute: 0.6 10*3/uL (ref 0.1–1.0)
NEUTROS PCT: 65.8 % (ref 43.0–77.0)
Neutro Abs: 5.1 10*3/uL (ref 1.4–7.7)
Platelets: 208 10*3/uL (ref 150.0–400.0)
RBC: 4.87 Mil/uL (ref 4.22–5.81)
RDW: 14.7 % (ref 11.5–15.5)
WBC: 7.8 10*3/uL (ref 4.0–10.5)

## 2013-10-29 LAB — HEPATIC FUNCTION PANEL
ALBUMIN: 4.5 g/dL (ref 3.5–5.2)
ALT: 23 U/L (ref 0–53)
AST: 28 U/L (ref 0–37)
Alkaline Phosphatase: 37 U/L — ABNORMAL LOW (ref 39–117)
BILIRUBIN DIRECT: 0.1 mg/dL (ref 0.0–0.3)
TOTAL PROTEIN: 7.4 g/dL (ref 6.0–8.3)
Total Bilirubin: 0.8 mg/dL (ref 0.2–1.2)

## 2013-10-29 LAB — POCT URINALYSIS DIPSTICK
Bilirubin, UA: NEGATIVE
Blood, UA: NEGATIVE
GLUCOSE UA: NEGATIVE
Ketones, UA: NEGATIVE
LEUKOCYTES UA: NEGATIVE
NITRITE UA: NEGATIVE
PROTEIN UA: NEGATIVE
SPEC GRAV UA: 1.015
Urobilinogen, UA: 0.2
pH, UA: 5.5

## 2013-10-29 LAB — LIPID PANEL
Cholesterol: 174 mg/dL (ref 0–200)
HDL: 54.2 mg/dL (ref >39.00)
LDL Cholesterol: 95 mg/dL (ref 0–99)
NonHDL: 119.8
TRIGLYCERIDES: 123 mg/dL (ref 0.0–149.0)
Total CHOL/HDL Ratio: 3
VLDL: 24.6 mg/dL (ref 0.0–40.0)

## 2013-10-29 LAB — TSH: TSH: 2.61 u[IU]/mL (ref 0.35–4.50)

## 2013-10-29 LAB — BASIC METABOLIC PANEL
BUN: 19 mg/dL (ref 6–23)
CALCIUM: 9.5 mg/dL (ref 8.4–10.5)
CO2: 28 meq/L (ref 19–32)
CREATININE: 1.5 mg/dL (ref 0.4–1.5)
Chloride: 104 mEq/L (ref 96–112)
GFR: 49.39 mL/min — ABNORMAL LOW (ref >60.00)
Glucose, Bld: 85 mg/dL (ref 70–99)
Potassium: 4.5 mEq/L (ref 3.5–5.1)
Sodium: 140 mEq/L (ref 135–145)

## 2013-10-29 NOTE — Progress Notes (Signed)
   Subjective:    Patient ID: [name] Skalicky., male    DOB: [birth year], 68 y.o.   MRN: [id number]  HPI 68 yr old male for a cpx. He feels well. He is going to the [place] frequently to follow his PSA counts which are slowly going back up. The latest as about 1.5. He recently had his third melanoma removed from his back.    Review of Systems  Constitutional: Negative.   HENT: Negative.   Eyes: Negative.   Respiratory: Negative.   Cardiovascular: Negative.   Gastrointestinal: Negative.   Genitourinary: Negative.   Musculoskeletal: Negative.   Skin: Negative.   Neurological: Negative.   Psychiatric/Behavioral: Negative.        Objective:   Physical Exam  Constitutional: He is oriented to person, place, and time. He appears well-developed and well-nourished. No distress.  HENT:  Head: Normocephalic and atraumatic.  Right Ear: External ear normal.  Left Ear: External ear normal.  Nose: Nose normal.  Mouth/Throat: Oropharynx is clear and moist. No oropharyngeal exudate.  Eyes: Conjunctivae and EOM are normal. Pupils are equal, round, and reactive to light. Right eye exhibits no discharge. Left eye exhibits no discharge. No scleral icterus.  Neck: Neck supple. No JVD present. No tracheal deviation present. No thyromegaly present.  Cardiovascular: Normal rate, regular rhythm, normal heart sounds and intact distal pulses.  Exam reveals no gallop and no friction rub.   No murmur heard. EKG normal   Pulmonary/Chest: Effort normal and breath sounds normal. No respiratory distress. He has no wheezes. He has no rales. He exhibits no tenderness.  Abdominal: Soft. Bowel sounds are normal. He exhibits no distension and no mass. There is no tenderness. There is no rebound and no guarding.  Genitourinary: Rectum normal, prostate normal and penis normal. Guaiac negative stool. No penile tenderness.  Musculoskeletal: Normal range of motion. He exhibits no edema and no tenderness.  Lymphadenopathy:      He has no cervical adenopathy.  Neurological: He is alert and oriented to person, place, and time. He has normal reflexes. No cranial nerve deficit. He exhibits normal muscle tone. Coordination normal.  Skin: Skin is warm and dry. No rash noted. He is not diaphoretic. No erythema. No pallor.  Psychiatric: He has a normal mood and affect. His behavior is normal. Judgment and thought content normal.          Assessment & Plan:  Well exam. Get fasting labs

## 2013-10-29 NOTE — Progress Notes (Signed)
Pre visit review using our clinic review tool, if applicable. No additional management support is needed unless otherwise documented below in the visit note. 

## 2013-12-16 DIAGNOSIS — H4011X1 Primary open-angle glaucoma, mild stage: Secondary | ICD-10-CM | POA: Diagnosis not present

## 2013-12-20 ENCOUNTER — Other Ambulatory Visit: Payer: Self-pay

## 2014-01-20 DIAGNOSIS — Z85820 Personal history of malignant melanoma of skin: Secondary | ICD-10-CM | POA: Diagnosis not present

## 2014-01-20 DIAGNOSIS — Z08 Encounter for follow-up examination after completed treatment for malignant neoplasm: Secondary | ICD-10-CM | POA: Diagnosis not present

## 2014-01-20 DIAGNOSIS — Z1283 Encounter for screening for malignant neoplasm of skin: Secondary | ICD-10-CM | POA: Diagnosis not present

## 2014-01-24 ENCOUNTER — Encounter: Payer: Self-pay | Admitting: Family Medicine

## 2014-01-24 ENCOUNTER — Ambulatory Visit (INDEPENDENT_AMBULATORY_CARE_PROVIDER_SITE_OTHER): Payer: Medicare Other | Admitting: Family Medicine

## 2014-01-24 VITALS — BP 154/90 | HR 59 | Temp 98.5°F | Ht 66.0 in | Wt 197.0 lb

## 2014-01-24 DIAGNOSIS — I2510 Atherosclerotic heart disease of native coronary artery without angina pectoris: Secondary | ICD-10-CM | POA: Diagnosis not present

## 2014-01-24 DIAGNOSIS — M25511 Pain in right shoulder: Secondary | ICD-10-CM | POA: Diagnosis not present

## 2014-01-24 NOTE — Progress Notes (Signed)
Pre visit review using our clinic review tool, if applicable. No additional management support is needed unless otherwise documented below in the visit note. 

## 2014-01-27 ENCOUNTER — Encounter: Payer: Self-pay | Admitting: Family Medicine

## 2014-01-27 NOTE — Progress Notes (Signed)
   Subjective:    Patient ID: [name] [name]., male    DOB: April 06, 1945, 68 y.o.   MRN: [id number]  HPI Here for 3 months of sharp pains in the right shoulder. No trauma that he can think of. Using heat and Aleve with mixed results.   Review of Systems  Constitutional: Negative.   Musculoskeletal: Positive for arthralgias.       Objective:   Physical Exam  Constitutional: He appears well-developed and well-nourished.  Musculoskeletal:  Right shoulder is not tender and there is no crepitus. ROM is limited by pain          Assessment & Plan:  Refer to Orthopedics.

## 2014-02-12 DIAGNOSIS — M25511 Pain in right shoulder: Secondary | ICD-10-CM | POA: Diagnosis not present

## 2014-03-27 DIAGNOSIS — M7551 Bursitis of right shoulder: Secondary | ICD-10-CM | POA: Diagnosis not present

## 2014-04-09 DIAGNOSIS — M7551 Bursitis of right shoulder: Secondary | ICD-10-CM | POA: Diagnosis not present

## 2014-04-09 DIAGNOSIS — S43431D Superior glenoid labrum lesion of right shoulder, subsequent encounter: Secondary | ICD-10-CM | POA: Diagnosis not present

## 2014-04-09 DIAGNOSIS — S46011D Strain of muscle(s) and tendon(s) of the rotator cuff of right shoulder, subsequent encounter: Secondary | ICD-10-CM | POA: Diagnosis not present

## 2014-04-10 ENCOUNTER — Telehealth: Payer: Self-pay | Admitting: Family Medicine

## 2014-04-10 NOTE — Telephone Encounter (Signed)
Pt called to ask if Dr Sarajane Jews had received a medical release from [facility].

## 2014-04-11 NOTE — Telephone Encounter (Signed)
I left a voice message for pt, yes we did get form and [name] is working on this now, we will fax when completed to below office.

## 2014-04-15 ENCOUNTER — Telehealth: Payer: Self-pay | Admitting: Family Medicine

## 2014-04-15 NOTE — Telephone Encounter (Signed)
Ppt has a medical release form that needs to be faxed to [facility]  [phone] Attn: Caryl Never Pt is going to have shoudler surgery, as soon as they can get form faxed back.  Today if possible

## 2014-04-16 DIAGNOSIS — C61 Malignant neoplasm of prostate: Secondary | ICD-10-CM | POA: Diagnosis not present

## 2014-04-16 NOTE — Telephone Encounter (Signed)
I spoke with pt and this was faxed over. He is going to call ortho to find out if they did get.

## 2014-04-30 DIAGNOSIS — C61 Malignant neoplasm of prostate: Secondary | ICD-10-CM | POA: Diagnosis not present

## 2014-04-30 DIAGNOSIS — N5201 Erectile dysfunction due to arterial insufficiency: Secondary | ICD-10-CM | POA: Diagnosis not present

## 2014-04-30 DIAGNOSIS — N393 Stress incontinence (female) (male): Secondary | ICD-10-CM | POA: Diagnosis not present

## 2014-05-01 ENCOUNTER — Encounter: Payer: Self-pay | Admitting: Family Medicine

## 2014-05-01 ENCOUNTER — Ambulatory Visit (INDEPENDENT_AMBULATORY_CARE_PROVIDER_SITE_OTHER): Payer: Medicare Other | Admitting: Family Medicine

## 2014-05-01 VITALS — BP 127/78 | HR 68 | Temp 98.4°F | Ht 66.0 in | Wt 200.0 lb

## 2014-05-01 DIAGNOSIS — E785 Hyperlipidemia, unspecified: Secondary | ICD-10-CM

## 2014-05-01 DIAGNOSIS — I2510 Atherosclerotic heart disease of native coronary artery without angina pectoris: Secondary | ICD-10-CM | POA: Diagnosis not present

## 2014-05-01 DIAGNOSIS — D0359 Melanoma in situ of other part of trunk: Secondary | ICD-10-CM | POA: Diagnosis not present

## 2014-05-01 DIAGNOSIS — I10 Essential (primary) hypertension: Secondary | ICD-10-CM | POA: Diagnosis not present

## 2014-05-01 DIAGNOSIS — N289 Disorder of kidney and ureter, unspecified: Secondary | ICD-10-CM

## 2014-05-01 DIAGNOSIS — Z08 Encounter for follow-up examination after completed treatment for malignant neoplasm: Secondary | ICD-10-CM | POA: Diagnosis not present

## 2014-05-01 DIAGNOSIS — Z85820 Personal history of malignant melanoma of skin: Secondary | ICD-10-CM | POA: Diagnosis not present

## 2014-05-01 DIAGNOSIS — Z1283 Encounter for screening for malignant neoplasm of skin: Secondary | ICD-10-CM | POA: Diagnosis not present

## 2014-05-01 DIAGNOSIS — D225 Melanocytic nevi of trunk: Secondary | ICD-10-CM | POA: Diagnosis not present

## 2014-05-01 NOTE — Progress Notes (Signed)
   Subjective:    Patient ID: [name] [name]., male    DOB: 1945-06-06, 69 y.o.   MRN: [id number]  HPI Here for follow up. He feels well in general. He is scheduled for surgery on the right shoulder on 05-05-14. His BP is stable. He has been exercising. He recently saw Urology and his PSA has doubled again to 0.22. They are watching this closely and are considering starting him on hormonal therapy.   Review of Systems  Constitutional: Negative.   Respiratory: Negative.   Cardiovascular: Negative.        Objective:   Physical Exam  Constitutional: He appears well-developed and well-nourished.  Neck: No thyromegaly present.  Cardiovascular: Normal rate, regular rhythm, normal heart sounds and intact distal pulses.   Pulmonary/Chest: Effort normal and breath sounds normal.  Lymphadenopathy:    He has no cervical adenopathy.          Assessment & Plan:  He seems to be doing well. Get fasting labs tomorrow

## 2014-05-01 NOTE — Progress Notes (Signed)
Pre visit review using our clinic review tool, if applicable. No additional management support is needed unless otherwise documented below in the visit note. 

## 2014-05-02 ENCOUNTER — Other Ambulatory Visit (INDEPENDENT_AMBULATORY_CARE_PROVIDER_SITE_OTHER): Payer: Medicare Other

## 2014-05-02 DIAGNOSIS — N289 Disorder of kidney and ureter, unspecified: Secondary | ICD-10-CM | POA: Diagnosis not present

## 2014-05-02 DIAGNOSIS — E785 Hyperlipidemia, unspecified: Secondary | ICD-10-CM

## 2014-05-02 LAB — LIPID PANEL
CHOLESTEROL: 152 mg/dL (ref 0–200)
HDL: 47 mg/dL (ref >39.00)
LDL CALC: 83 mg/dL (ref 0–99)
NonHDL: 105
TRIGLYCERIDES: 108 mg/dL (ref 0.0–149.0)
Total CHOL/HDL Ratio: 3
VLDL: 21.6 mg/dL (ref 0.0–40.0)

## 2014-05-02 LAB — HEPATIC FUNCTION PANEL
ALT: 22 U/L (ref 0–53)
AST: 19 U/L (ref 0–37)
Albumin: 4.4 g/dL (ref 3.5–5.2)
Alkaline Phosphatase: 34 U/L — ABNORMAL LOW (ref 39–117)
Bilirubin, Direct: 0.1 mg/dL (ref 0.0–0.3)
TOTAL PROTEIN: 6.6 g/dL (ref 6.0–8.3)
Total Bilirubin: 0.6 mg/dL (ref 0.2–1.2)

## 2014-05-02 LAB — BASIC METABOLIC PANEL
BUN: 23 mg/dL (ref 6–23)
CALCIUM: 9.4 mg/dL (ref 8.4–10.5)
CO2: 28 mEq/L (ref 19–32)
Chloride: 107 mEq/L (ref 96–112)
Creatinine, Ser: 1.55 mg/dL — ABNORMAL HIGH (ref 0.40–1.50)
GFR: 47.84 mL/min — ABNORMAL LOW (ref >60.00)
GLUCOSE: 109 mg/dL — AB (ref 70–99)
Potassium: 3.9 mEq/L (ref 3.5–5.1)
Sodium: 142 mEq/L (ref 135–145)

## 2014-05-05 DIAGNOSIS — M7541 Impingement syndrome of right shoulder: Secondary | ICD-10-CM | POA: Diagnosis not present

## 2014-05-05 DIAGNOSIS — M75101 Unspecified rotator cuff tear or rupture of right shoulder, not specified as traumatic: Secondary | ICD-10-CM | POA: Diagnosis not present

## 2014-05-05 DIAGNOSIS — G8918 Other acute postprocedural pain: Secondary | ICD-10-CM | POA: Diagnosis not present

## 2014-05-05 DIAGNOSIS — S46011A Strain of muscle(s) and tendon(s) of the rotator cuff of right shoulder, initial encounter: Secondary | ICD-10-CM | POA: Diagnosis not present

## 2014-05-05 DIAGNOSIS — X58XXXA Exposure to other specified factors, initial encounter: Secondary | ICD-10-CM | POA: Diagnosis not present

## 2014-05-05 DIAGNOSIS — Y929 Unspecified place or not applicable: Secondary | ICD-10-CM | POA: Diagnosis not present

## 2014-05-05 DIAGNOSIS — M19011 Primary osteoarthritis, right shoulder: Secondary | ICD-10-CM | POA: Diagnosis not present

## 2014-05-05 DIAGNOSIS — S43431A Superior glenoid labrum lesion of right shoulder, initial encounter: Secondary | ICD-10-CM | POA: Diagnosis not present

## 2014-05-06 HISTORY — PX: ACROMIOPLASTY: SHX131

## 2014-05-09 ENCOUNTER — Encounter: Payer: Self-pay | Admitting: Physical Therapy

## 2014-05-09 ENCOUNTER — Ambulatory Visit: Payer: Medicare Other | Attending: Orthopedic Surgery | Admitting: Physical Therapy

## 2014-05-09 DIAGNOSIS — M25511 Pain in right shoulder: Secondary | ICD-10-CM | POA: Insufficient documentation

## 2014-05-09 DIAGNOSIS — M25611 Stiffness of right shoulder, not elsewhere classified: Secondary | ICD-10-CM | POA: Diagnosis not present

## 2014-05-09 NOTE — Therapy (Signed)
[facility] [place], [place], [address] Phone: [phone]   Fax:  [phone]  Physical Therapy Evaluation  Patient Details  Name: [name] [name]. MRN: [id number] Date of Birth: 09/02/1945 Referring Provider:  [name], MD  Encounter Date: 05/09/2014      PT End of Session - 05/09/14 1000    PT Start Time 5397   PT Stop Time 0940   PT Time Calculation (min) 36 min      Past Medical History  Diagnosis Date  . HYPERLIPIDEMIA 09/26/2006  . GOUT 05/16/2007  . HYPERTENSION 04/13/2007  . DIVERTICULOSIS, COLON 04/13/2007  . JOINT EFFUSION, KNEE 06/15/2009  . ELEVATED PROSTATE SPECIFIC ANTIGEN 04/14/2007  . ABNORMAL EKG 10/07/2008  . ADVEF, DRUG/MED/BIOL SUBST, OTHER DRUG NOS 09/26/2006  . Glaucoma     sees Eye Clinic at [facility]   . Diabetes mellitus     type 2, diet controlled   . Renal insufficiency     RESOLVED PER PATIENT  . DEGENERATIVE JOINT DISEASE, MODERATE 04/13/2007  . PROSTATE CANCER 10/05/2009    sees [name]  . Melanoma     sees [name]     Past Surgical History  Procedure Laterality Date  . Vasectomy    . Tonsillectomy    . Bunionectomy      bilateral  . Prostatectomy      per [name]  . Cystectomy      benign from hand  . Colonoscopy  2007    at [facility], clear, repeat in 10 yrs   . Melanoma excision  2012    per [name]  . Knee arthroscopy  Jan. 2013    right knee, per [name]   . Umbilical hernia repair  11/01/2011    Procedure: HERNIA REPAIR UMBILICAL ADULT;  Surgeon: Earnstine Regal, MD;  Location: [place];  Service: General;  Laterality: N/A;  Repair umbilical hernia with mesh patch  . Glaucoma surgery Bilateral 2014   . Ventral hernia repair  01/10/2013    with mesh     [name]  . Ventral hernia repair N/A 01/10/2013    Procedure: HERNIA REPAIR VENTRAL ADULT ;  Surgeon: Earnstine Regal, MD;  Location: [place];  Service: General;  Laterality: N/A;   . Insertion of mesh N/A 01/10/2013    Procedure: INSERTION OF MESH;  Surgeon: Earnstine Regal, MD;  Location: [place];  Service: General;  Laterality: N/A;    There were no vitals taken for this visit.  Visit Diagnosis:  Right shoulder pain - Plan: PT plan of care cert/re-cert  Shoulder stiffness, right - Plan: PT plan of care cert/re-cert      Subjective Assessment - 05/09/14 0907    Symptoms Shoulder sling not fitting right.  Re-adjusted for comfortable fit.   Patient Stated Goals Get out of pain and use right arm again.   Currently in Pain? Yes   Pain Score 4    Pain Location Shoulder   Pain Orientation Right   Pain Descriptors / Indicators Aching   Pain Type Surgical pain   Pain Onset More than a month ago   Aggravating Factors  Movement out of sling.   Pain Relieving Factors Rest.   Effect of Pain on Daily Activities Can't use right arm.          [facility] PT Assessment - 05/09/14 0001    Assessment   Medical Diagnosis S/p  right RTC surgery.   Onset Date 05/05/14   Precautions   Precaution Comments Pasive-assistive right shoulder range of motion   Required Braces or Orthoses --  Right shoulder sling.   Balance Screen   Has the patient fallen in the past 6 months No   Has the patient had a decrease in activity level because of a fear of falling?  No   Is the patient reluctant to leave their home because of a fear of falling?  No   PROM   PROM Assessment Site Shoulder  PASSIVE-ASSISTIVE ROM.   Right/Left Shoulder Right   Right Shoulder Flexion 90 Degrees   Right Shoulder External Rotation 11 Degrees   Palpation   Palpation Minimal right shoulder tenderness.                  [facility] Adult PT Treatment/Exercise - 05/09/14 0001    Manual Therapy   Manual Therapy --  Passive-assistive right shoulder ROM into flex/ext x 8 mins.                PT Education - 05/09/14 0940    Education provided Yes   Education Details Pendulum; From recliner AA flexion    Person(s) Educated Patient   Methods Explanation;Demonstration;Handout;Tactile cues   Comprehension Verbalized understanding          PT Short Term Goals - 05/09/14 1001    PT SHORT TERM GOAL #1   Title STG's=LTG's.   Time 4   Period Weeks   Status New           PT Long Term Goals - 05/09/14 1002    PT LONG TERM GOAL #1   Title Ind with advanced HEP.   Time 4   Period Weeks   Status New   PT LONG TERM GOAL #2   Title Active right shoulder flexion to 155 degrees so the patient can easily reach overhead   Time 4   Period Weeks   Status New   PT LONG TERM GOAL #3   Title Active ER to 70 degrees+ to allow for easily donning/doffing of apparel   Time 4   Period Weeks   Status New   PT LONG TERM GOAL #4   Title Increase ROM so patient is able to reach behind back to L3.   Time 4   Period Weeks   Status New   PT LONG TERM GOAL #5   Title Increase shoulder strength to a solid 4+/5 to increase stability for performance of functional activities   Time 4   Period Weeks   Status New   Additional Long Term Goals   Additional Long Term Goals Yes   PT LONG TERM GOAL #6   Title Perform ADL's with pain not > 3/10   Time 4   Period Weeks   Status New               Plan - 05/09/14 9381    Clinical Impression Statement The patient reports tht after scuba diving he pulled right shoulder trying to hold a ladder.  He underwent a right RTC repair surgery on 05/05/14.  He reports his pain has been no greater than a 4/10.  His wife removed steri-strips and replaced with new ones and covered his surgical sites with sterile bandaids.  He has been performing the pendulum exercise; hitch-hiker (seated) and thigh slides.   Pt will benefit from skilled therapeutic intervention in order to improve on the following deficits Decreased  range of motion;Pain;Decreased activity tolerance   Rehab Potential Excellent   PT Frequency 3x / week   PT Duration 4 weeks  or 12 visits.   PT  Next Visit Plan Right passive-assistive ROM.   Consulted and Agree with Plan of Care Patient          G-Codes - 08-Jun-2014 1105    Functional Assessment Tool Used FOTO   Functional Limitation Mobility: Walking and moving around   Mobility: Walking and Moving Around Current Status [phone]) 100 percent impaired, limited or restricted   Mobility: Walking and Moving Around Goal Status [phone]) At least 20 percent but less than 40 percent impaired, limited or restricted       Problem List Patient Active Problem List   Diagnosis Date Noted  . Incisional hernia 08/11/2011  . Renal insufficiency 07/28/2011  . PROSTATE CANCER 10/05/2009  . Coronary atherosclerosis 10/05/2009  . JOINT EFFUSION, KNEE 06/15/2009  . ABNORMAL EKG 10/07/2008  . PROSTATE CANCER, HX OF 10/31/2007  . GOUT 05/16/2007  . ELEVATED PROSTATE SPECIFIC ANTIGEN 04/14/2007  . Essential hypertension 04/13/2007  . DIVERTICULOSIS, COLON 04/13/2007  . DEGENERATIVE JOINT DISEASE, MODERATE 04/13/2007  . Hyperlipidemia 09/26/2006  . ADVEF, DRUG/MED/BIOL SUBST, OTHER DRUG NOS 09/26/2006    Keyen Marban, [place] MPT 06/08/2014, 11:10 AM  [facility] [address] [place], [place], [address] Phone: [phone]   Fax:  [phone]

## 2014-05-09 NOTE — Patient Instructions (Signed)
Active / Assistive ROM Flexion With Static Hold Above Head   Clasp hands in lap. Slowly raise straight arms toward ceiling-----FROM A RECLINED POSITION. Hold 10 second------LEFT HAND IS LIFTING RIGHT ARM.  4-6 times daily.    Pendulum exercise.  Circle CW and CCW; side to side; forward and back-------20 reps each direction.  SWAY body to move left ARM.Marland Kitchen  Copyright  VHI. All rights reserved.

## 2014-05-12 ENCOUNTER — Ambulatory Visit: Payer: Medicare Other | Admitting: Physical Therapy

## 2014-05-12 DIAGNOSIS — M25611 Stiffness of right shoulder, not elsewhere classified: Secondary | ICD-10-CM | POA: Diagnosis not present

## 2014-05-12 DIAGNOSIS — M25612 Stiffness of left shoulder, not elsewhere classified: Secondary | ICD-10-CM

## 2014-05-12 DIAGNOSIS — M25512 Pain in left shoulder: Secondary | ICD-10-CM

## 2014-05-12 DIAGNOSIS — M25511 Pain in right shoulder: Secondary | ICD-10-CM | POA: Diagnosis not present

## 2014-05-12 NOTE — Patient Instructions (Signed)
SHOULDER: External Rotation - Supine (Cane)   Hold cane with both hands. Rotate arm away from body. Keep elbow propped up on pillows and next to body. _15 to 20 reps. 3-4 times day. VHI. All rights reserved.  Cane Horizontal - Supine

## 2014-05-12 NOTE — Therapy (Signed)
[facility] [place], [place], [address] Phone: [phone]   Fax:  [phone]  Physical Therapy Treatment  Patient Details  Name: [name] [name]. MRN: [id number] Date of Birth: 02-Jan-1946 Referring Provider:  [name], MD  Encounter Date: 05/12/2014      PT End of Session - 05/12/14 0907    Visit Number 2   Number of Visits 12   PT Start Time 0900   PT Stop Time 0949   PT Time Calculation (min) 49 min   Activity Tolerance Patient tolerated treatment well   Behavior During Therapy [facility] for tasks assessed/performed      Past Medical History  Diagnosis Date  . HYPERLIPIDEMIA 09/26/2006  . GOUT 05/16/2007  . HYPERTENSION 04/13/2007  . DIVERTICULOSIS, COLON 04/13/2007  . JOINT EFFUSION, KNEE 06/15/2009  . ELEVATED PROSTATE SPECIFIC ANTIGEN 04/14/2007  . ABNORMAL EKG 10/07/2008  . ADVEF, DRUG/MED/BIOL SUBST, OTHER DRUG NOS 09/26/2006  . Glaucoma     sees Eye Clinic at [facility]   . Diabetes mellitus     type 2, diet controlled   . Renal insufficiency     RESOLVED PER PATIENT  . DEGENERATIVE JOINT DISEASE, MODERATE 04/13/2007  . PROSTATE CANCER 10/05/2009    sees [name]  . Melanoma     sees [name]     Past Surgical History  Procedure Laterality Date  . Vasectomy    . Tonsillectomy    . Bunionectomy      bilateral  . Prostatectomy      per [name]  . Cystectomy      benign from hand  . Colonoscopy  2007    at [facility], clear, repeat in 10 yrs   . Melanoma excision  2012    per [name]  . Knee arthroscopy  Jan. 2013    right knee, per [name]   . Umbilical hernia repair  11/01/2011    Procedure: HERNIA REPAIR UMBILICAL ADULT;  Surgeon: Earnstine Regal, MD;  Location: [place];  Service: General;  Laterality: N/A;  Repair umbilical hernia with mesh patch  . Glaucoma surgery Bilateral 2014   . Ventral hernia repair  01/10/2013    with mesh     [name]  . Ventral  hernia repair N/A 01/10/2013    Procedure: HERNIA REPAIR VENTRAL ADULT ;  Surgeon: Earnstine Regal, MD;  Location: [place];  Service: General;  Laterality: N/A;  . Insertion of mesh N/A 01/10/2013    Procedure: INSERTION OF MESH;  Surgeon: Earnstine Regal, MD;  Location: [place];  Service: General;  Laterality: N/A;    There were no vitals taken for this visit.  Visit Diagnosis:  Left shoulder pain  Shoulder stiffness, left      Subjective Assessment - 05/12/14 0908    Symptoms Did exercises about 4 times a day over weekend.   Currently in Pain? Yes   Pain Score 2   2/10 rest.   Pain Location Shoulder   Pain Orientation Right   Pain Descriptors / Indicators Aching   Pain Type Surgical pain     Treatnment:  The ex supine cane exercise into ER x 10 minutes.  Picture provided.  Manual therapy: 1-1 passive-assistive left shoulder ROM into flexion and ER x 28 minutes.  PT Short Term Goals - 05/09/14 1001    PT SHORT TERM GOAL #1   Title STG's=LTG's.   Time 4   Period Weeks   Status New           PT Long Term Goals - 05/09/14 1002    PT LONG TERM GOAL #1   Title Ind with advanced HEP.   Time 4   Period Weeks   Status New   PT LONG TERM GOAL #2   Title Active right shoulder flexion to 155 degrees so the patient can easily reach overhead   Time 4   Period Weeks   Status New   PT LONG TERM GOAL #3   Title Active ER to 70 degrees+ to allow for easily donning/doffing of apparel   Time 4   Period Weeks   Status New   PT LONG TERM GOAL #4   Title Increase ROM so patient is able to reach behind back to L3.   Time 4   Period Weeks   Status New   PT LONG TERM GOAL #5   Title Increase shoulder strength to a solid 4+/5 to increase stability for performance of functional activities   Time 4   Period Weeks   Status New   Additional Long Term Goals   Additional Long Term Goals Yes   PT LONG TERM GOAL #6   Title Perform ADL's with  pain not > 3/10   Time 4   Period Weeks   Status New               Plan - 05/12/14 8119    PT Next Visit Plan Continue with current POC.        Problem List Patient Active Problem List   Diagnosis Date Noted  . Incisional hernia 08/11/2011  . Renal insufficiency 07/28/2011  . PROSTATE CANCER 10/05/2009  . Coronary atherosclerosis 10/05/2009  . JOINT EFFUSION, KNEE 06/15/2009  . ABNORMAL EKG 10/07/2008  . PROSTATE CANCER, HX OF 10/31/2007  . GOUT 05/16/2007  . ELEVATED PROSTATE SPECIFIC ANTIGEN 04/14/2007  . Essential hypertension 04/13/2007  . DIVERTICULOSIS, COLON 04/13/2007  . DEGENERATIVE JOINT DISEASE, MODERATE 04/13/2007  . Hyperlipidemia 09/26/2006  . ADVEF, DRUG/MED/BIOL SUBST, OTHER DRUG NOS 09/26/2006    Carnisha Feltz, [place] MPT 05/12/2014, 10:00 AM  [facility] [address] [place], [place], [address] Phone: [phone]   Fax:  [phone]

## 2014-05-14 ENCOUNTER — Encounter: Payer: Self-pay | Admitting: Physical Therapy

## 2014-05-14 ENCOUNTER — Ambulatory Visit: Payer: Medicare Other | Admitting: Physical Therapy

## 2014-05-14 DIAGNOSIS — M25512 Pain in left shoulder: Secondary | ICD-10-CM

## 2014-05-14 DIAGNOSIS — M25611 Stiffness of right shoulder, not elsewhere classified: Secondary | ICD-10-CM | POA: Diagnosis not present

## 2014-05-14 DIAGNOSIS — M25511 Pain in right shoulder: Secondary | ICD-10-CM

## 2014-05-14 DIAGNOSIS — M25612 Stiffness of left shoulder, not elsewhere classified: Secondary | ICD-10-CM

## 2014-05-14 NOTE — Therapy (Signed)
[facility] [place], [place], [address] Phone: [phone]   Fax:  [phone]  Physical Therapy Treatment  Patient Details  Name: [name] [name]. MRN: [id number] Date of Birth: 1945-06-15 Referring Provider:  [name], MD  Encounter Date: 05/14/2014      PT End of Session - 05/14/14 0933    Visit Number 3   Number of Visits 12   PT Start Time 0901   PT Stop Time 0933   PT Time Calculation (min) 32 min      Past Medical History  Diagnosis Date  . HYPERLIPIDEMIA 09/26/2006  . GOUT 05/16/2007  . HYPERTENSION 04/13/2007  . DIVERTICULOSIS, COLON 04/13/2007  . JOINT EFFUSION, KNEE 06/15/2009  . ELEVATED PROSTATE SPECIFIC ANTIGEN 04/14/2007  . ABNORMAL EKG 10/07/2008  . ADVEF, DRUG/MED/BIOL SUBST, OTHER DRUG NOS 09/26/2006  . Glaucoma     sees Eye Clinic at [facility]   . Diabetes mellitus     type 2, diet controlled   . Renal insufficiency     RESOLVED PER PATIENT  . DEGENERATIVE JOINT DISEASE, MODERATE 04/13/2007  . PROSTATE CANCER 10/05/2009    sees [name]  . Melanoma     sees [name]     Past Surgical History  Procedure Laterality Date  . Vasectomy    . Tonsillectomy    . Bunionectomy      bilateral  . Prostatectomy      per [name]  . Cystectomy      benign from hand  . Colonoscopy  2007    at [facility], clear, repeat in 10 yrs   . Melanoma excision  2012    per [name]  . Knee arthroscopy  Jan. 2013    right knee, per [name]   . Umbilical hernia repair  11/01/2011    Procedure: HERNIA REPAIR UMBILICAL ADULT;  Surgeon: Earnstine Regal, MD;  Location: [place];  Service: General;  Laterality: N/A;  Repair umbilical hernia with mesh patch  . Glaucoma surgery Bilateral 2014   . Ventral hernia repair  01/10/2013    with mesh     [name]  . Ventral hernia repair N/A 01/10/2013    Procedure: HERNIA REPAIR VENTRAL ADULT ;  Surgeon: Earnstine Regal, MD;  Location: [place];   Service: General;  Laterality: N/A;  . Insertion of mesh N/A 01/10/2013    Procedure: INSERTION OF MESH;  Surgeon: Earnstine Regal, MD;  Location: [place];  Service: General;  Laterality: N/A;    There were no vitals taken for this visit.  Visit Diagnosis:  Left shoulder pain  Shoulder stiffness, left  Right shoulder pain  Shoulder stiffness, right      Subjective Assessment - 05/14/14 0909    Symptoms a little sore today   Currently in Pain? Yes   Pain Score 2    Pain Location Shoulder   Pain Orientation Right   Pain Descriptors / Indicators Sore   Pain Type Surgical pain   Pain Onset More than a month ago   Aggravating Factors  movement   Pain Relieving Factors rest          [facility] PT Assessment - 05/14/14 0001    PROM   PROM Assessment Site Shoulder   Right/Left Shoulder Right   Right Shoulder External Rotation 45 Degrees   Right Shoulder Horizontal ABduction 115 Degrees  [facility] Adult PT Treatment/Exercise - 05/14/14 0001    Manual Therapy   Manual Therapy Passive ROM   Passive ROM --  AAROM for rt shoulder flex/ER with gentle range                  PT Short Term Goals - 05/09/14 1001    PT SHORT TERM GOAL #1   Title STG's=LTG's.   Time 4   Period Weeks   Status New           PT Long Term Goals - 05/09/14 1002    PT LONG TERM GOAL #1   Title Ind with advanced HEP.   Time 4   Period Weeks   Status New   PT LONG TERM GOAL #2   Title Active right shoulder flexion to 155 degrees so the patient can easily reach overhead   Time 4   Period Weeks   Status New   PT LONG TERM GOAL #3   Title Active ER to 70 degrees+ to allow for easily donning/doffing of apparel   Time 4   Period Weeks   Status New   PT LONG TERM GOAL #4   Title Increase ROM so patient is able to reach behind back to L3.   Time 4   Period Weeks   Status New   PT LONG TERM GOAL #5   Title Increase shoulder strength to a solid 4+/5 to increase  stability for performance of functional activities   Time 4   Period Weeks   Status New   Additional Long Term Goals   Additional Long Term Goals Yes   PT LONG TERM GOAL #6   Title Perform ADL's with pain not > 3/10   Time 4   Period Weeks   Status New               Plan - 05/14/14 0935    Clinical Impression Statement pt tolerated tx very well with no pain, pt understands protocol and HEP.goals ongoing   Pt will benefit from skilled therapeutic intervention in order to improve on the following deficits Decreased range of motion;Pain;Decreased activity tolerance   Rehab Potential Excellent   PT Frequency 3x / week   PT Duration 4 weeks   PT Next Visit Plan Continue with current POC.   Consulted and Agree with Plan of Care Patient        Problem List Patient Active Problem List   Diagnosis Date Noted  . Incisional hernia 08/11/2011  . Renal insufficiency 07/28/2011  . PROSTATE CANCER 10/05/2009  . Coronary atherosclerosis 10/05/2009  . JOINT EFFUSION, KNEE 06/15/2009  . ABNORMAL EKG 10/07/2008  . PROSTATE CANCER, HX OF 10/31/2007  . GOUT 05/16/2007  . ELEVATED PROSTATE SPECIFIC ANTIGEN 04/14/2007  . Essential hypertension 04/13/2007  . DIVERTICULOSIS, COLON 04/13/2007  . DEGENERATIVE JOINT DISEASE, MODERATE 04/13/2007  . Hyperlipidemia 09/26/2006  . ADVEF, DRUG/MED/BIOL SUBST, OTHER DRUG NOS 09/26/2006    Doloris Servantes P, PTA 05/14/2014, 9:37 AM  [facility] [address] [place], [place], [address] Phone: [phone]   Fax:  [phone]

## 2014-05-15 DIAGNOSIS — M7551 Bursitis of right shoulder: Secondary | ICD-10-CM | POA: Diagnosis not present

## 2014-05-15 DIAGNOSIS — Z4789 Encounter for other orthopedic aftercare: Secondary | ICD-10-CM | POA: Diagnosis not present

## 2014-05-15 DIAGNOSIS — S46011D Strain of muscle(s) and tendon(s) of the rotator cuff of right shoulder, subsequent encounter: Secondary | ICD-10-CM | POA: Diagnosis not present

## 2014-05-15 DIAGNOSIS — S43431D Superior glenoid labrum lesion of right shoulder, subsequent encounter: Secondary | ICD-10-CM | POA: Diagnosis not present

## 2014-05-19 ENCOUNTER — Encounter: Payer: Self-pay | Admitting: Physical Therapy

## 2014-05-19 ENCOUNTER — Ambulatory Visit: Payer: Medicare Other | Admitting: Physical Therapy

## 2014-05-19 DIAGNOSIS — M25511 Pain in right shoulder: Secondary | ICD-10-CM | POA: Diagnosis not present

## 2014-05-19 DIAGNOSIS — L859 Epidermal thickening, unspecified: Secondary | ICD-10-CM | POA: Diagnosis not present

## 2014-05-19 DIAGNOSIS — M25611 Stiffness of right shoulder, not elsewhere classified: Secondary | ICD-10-CM | POA: Diagnosis not present

## 2014-05-19 DIAGNOSIS — D0359 Melanoma in situ of other part of trunk: Secondary | ICD-10-CM | POA: Diagnosis not present

## 2014-05-19 NOTE — Therapy (Signed)
[facility] [place], [place], [address] Phone: [phone]   Fax:  [phone]  Physical Therapy Treatment  Patient Details  Name: [name] [name]. MRN: [id number] Date of Birth: [birth year] Referring Provider:  [name], MD  Encounter Date: 05/19/2014      PT End of Session - 05/19/14 1427    Visit Number 4   Number of Visits 12   PT Start Time 1340   PT Stop Time 1425   PT Time Calculation (min) 45 min   Activity Tolerance Patient tolerated treatment well   Behavior During Therapy [facility] for tasks assessed/performed      Past Medical History  Diagnosis Date  . HYPERLIPIDEMIA 09/26/2006  . GOUT 05/16/2007  . HYPERTENSION 04/13/2007  . DIVERTICULOSIS, COLON 04/13/2007  . JOINT EFFUSION, KNEE 06/15/2009  . ELEVATED PROSTATE SPECIFIC ANTIGEN 04/14/2007  . ABNORMAL EKG 10/07/2008  . ADVEF, DRUG/MED/BIOL SUBST, OTHER DRUG NOS 09/26/2006  . Glaucoma     sees Eye Clinic at [facility]   . Diabetes mellitus     type 2, diet controlled   . Renal insufficiency     RESOLVED PER PATIENT  . DEGENERATIVE JOINT DISEASE, MODERATE 04/13/2007  . PROSTATE CANCER 10/05/2009    sees [name]  . Melanoma     sees [name]     Past Surgical History  Procedure Laterality Date  . Vasectomy    . Tonsillectomy    . Bunionectomy      bilateral  . Prostatectomy      per [name]  . Cystectomy      benign from hand  . Colonoscopy  2007    at [facility], clear, repeat in 10 yrs   . Melanoma excision  2012    per [name]  . Knee arthroscopy  Jan. 2013    right knee, per [name]   . Umbilical hernia repair  11/01/2011    Procedure: HERNIA REPAIR UMBILICAL ADULT;  Surgeon: Earnstine Regal, MD;  Location: [place];  Service: General;  Laterality: N/A;  Repair umbilical hernia with mesh patch  . Glaucoma surgery Bilateral 2014   . Ventral hernia repair  01/10/2013    with mesh     [name]  . Ventral  hernia repair N/A 01/10/2013    Procedure: HERNIA REPAIR VENTRAL ADULT ;  Surgeon: Earnstine Regal, MD;  Location: [place];  Service: General;  Laterality: N/A;  . Insertion of mesh N/A 01/10/2013    Procedure: INSERTION OF MESH;  Surgeon: Earnstine Regal, MD;  Location: [place];  Service: General;  Laterality: N/A;    There were no vitals filed for this visit.  Visit Diagnosis:  Right shoulder pain  Shoulder stiffness, right      Subjective Assessment - 05/19/14 1344    Symptoms Feeling good. Patient stated that he completed the HEP four times a day for the past 4 days. Has some soreness and has been sleeping lazyboy since surgery. Patient went back for 10 day post-op visit with [name] and wanted patient to complete ER in different postion with palm up and no cane and also to continue all other exercises. Patient also states that he gets some muscle quivers in right shoulder.   Currently in Pain? No/denies   Pain Location Shoulder   Pain Orientation Right   Pain Descriptors / Indicators Sore   Aggravating Factors  Overdoing exercises  Pain Relieving Factors Movement            [facility] PT Assessment - 05/19/14 0001    Assessment   Medical Diagnosis S/p right RTC surgery.   ROM / Strength   AROM / PROM / Strength PROM   PROM   Overall PROM  Deficits   PROM Assessment Site Shoulder   Right/Left Shoulder Right   Right Shoulder Flexion --  115 degrees   Right Shoulder ABduction --  Scaption 126 Degrees   Right Shoulder External Rotation --  38 Degrees                   [facility] Adult PT Treatment/Exercise - 05/19/14 0001    Exercises   Exercises Shoulder   Shoulder Exercises: Standing   External Rotation AROM;Right;20 reps  Slowly allow palm up per MD orders (look at hand)   Manual Therapy   Manual Therapy Passive ROM   Passive ROM --  AAROM Right shoulder flexion, ER, scaption                  PT Short Term Goals - 05/09/14 1001    PT SHORT TERM GOAL  #1   Title STG's=LTG's.   Time 4   Period Weeks   Status New           PT Long Term Goals - 05/09/14 1002    PT LONG TERM GOAL #1   Title Ind with advanced HEP.   Time 4   Period Weeks   Status New   PT LONG TERM GOAL #2   Title Active right shoulder flexion to 155 degrees so the patient can easily reach overhead   Time 4   Period Weeks   Status New   PT LONG TERM GOAL #3   Title Active ER to 70 degrees+ to allow for easily donning/doffing of apparel   Time 4   Period Weeks   Status New   PT LONG TERM GOAL #4   Title Increase ROM so patient is able to reach behind back to L3.   Time 4   Period Weeks   Status New   PT LONG TERM GOAL #5   Title Increase shoulder strength to a solid 4+/5 to increase stability for performance of functional activities   Time 4   Period Weeks   Status New   Additional Long Term Goals   Additional Long Term Goals Yes   PT LONG TERM GOAL #6   Title Perform ADL's with pain not > 3/10   Time 4   Period Weeks   Status New               Plan - 05/19/14 1428    Clinical Impression Statement Patient tolerated treatment well with no complaints of pain. Patient continues to be compliant with HEP and all MD orders. Right shoulder ROM continues to improve. All goals remain ongoing at this time.   Pt will benefit from skilled therapeutic intervention in order to improve on the following deficits Decreased range of motion;Pain;Decreased activity tolerance   Rehab Potential Excellent   PT Frequency 3x / week   PT Duration 4 weeks   PT Next Visit Plan Continue per PT POC. See flowsheet for new directions regarding external rotation exercise per MD. Patient's next MD visit is in one month.   Consulted and Agree with Plan of Care Patient        Problem List Patient Active Problem List  Diagnosis Date Noted  . Incisional hernia 08/11/2011  . Renal insufficiency 07/28/2011  . PROSTATE CANCER 10/05/2009  . Coronary atherosclerosis  10/05/2009  . JOINT EFFUSION, KNEE 06/15/2009  . ABNORMAL EKG 10/07/2008  . PROSTATE CANCER, HX OF 10/31/2007  . GOUT 05/16/2007  . ELEVATED PROSTATE SPECIFIC ANTIGEN 04/14/2007  . Essential hypertension 04/13/2007  . DIVERTICULOSIS, COLON 04/13/2007  . DEGENERATIVE JOINT DISEASE, MODERATE 04/13/2007  . Hyperlipidemia 09/26/2006  . ADVEF, DRUG/MED/BIOL SUBST, OTHER DRUG NOS 09/26/2006    Wynelle Fanny, PTA 05/19/2014, 2:37 PM  [facility] [address] [place], [place], [address] Phone: [phone]   Fax:  [phone]

## 2014-05-21 ENCOUNTER — Ambulatory Visit: Payer: Medicare Other | Admitting: Physical Therapy

## 2014-05-21 ENCOUNTER — Encounter: Payer: Self-pay | Admitting: Physical Therapy

## 2014-05-21 DIAGNOSIS — M25611 Stiffness of right shoulder, not elsewhere classified: Secondary | ICD-10-CM

## 2014-05-21 DIAGNOSIS — M25511 Pain in right shoulder: Secondary | ICD-10-CM

## 2014-05-21 DIAGNOSIS — M25512 Pain in left shoulder: Secondary | ICD-10-CM

## 2014-05-21 DIAGNOSIS — M25612 Stiffness of left shoulder, not elsewhere classified: Secondary | ICD-10-CM

## 2014-05-21 NOTE — Therapy (Signed)
[facility] [place], [place], [address] Phone: [phone]   Fax:  [phone]  Physical Therapy Treatment  Patient Details  Name: [name] [name]. MRN: [id number] Date of Birth: 07/15/1945 Referring Provider:  [name], MD  Encounter Date: 05/21/2014      PT End of Session - 05/21/14 0935    Visit Number 5   Number of Visits 12   PT Start Time 0900   PT Stop Time 0935   PT Time Calculation (min) 35 min   Activity Tolerance Patient tolerated treatment well   Behavior During Therapy [facility] for tasks assessed/performed      Past Medical History  Diagnosis Date  . HYPERLIPIDEMIA 09/26/2006  . GOUT 05/16/2007  . HYPERTENSION 04/13/2007  . DIVERTICULOSIS, COLON 04/13/2007  . JOINT EFFUSION, KNEE 06/15/2009  . ELEVATED PROSTATE SPECIFIC ANTIGEN 04/14/2007  . ABNORMAL EKG 10/07/2008  . ADVEF, DRUG/MED/BIOL SUBST, OTHER DRUG NOS 09/26/2006  . Glaucoma     sees Eye Clinic at [facility]   . Diabetes mellitus     type 2, diet controlled   . Renal insufficiency     RESOLVED PER PATIENT  . DEGENERATIVE JOINT DISEASE, MODERATE 04/13/2007  . PROSTATE CANCER 10/05/2009    sees [name]  . Melanoma     sees [name]     Past Surgical History  Procedure Laterality Date  . Vasectomy    . Tonsillectomy    . Bunionectomy      bilateral  . Prostatectomy      per [name]  . Cystectomy      benign from hand  . Colonoscopy  2007    at [facility], clear, repeat in 10 yrs   . Melanoma excision  2012    per [name]  . Knee arthroscopy  Jan. 2013    right knee, per [name]   . Umbilical hernia repair  11/01/2011    Procedure: HERNIA REPAIR UMBILICAL ADULT;  Surgeon: Earnstine Regal, MD;  Location: [place];  Service: General;  Laterality: N/A;  Repair umbilical hernia with mesh patch  . Glaucoma surgery Bilateral 2014   . Ventral hernia repair  01/10/2013    with mesh     [name]  . Ventral  hernia repair N/A 01/10/2013    Procedure: HERNIA REPAIR VENTRAL ADULT ;  Surgeon: Earnstine Regal, MD;  Location: [place];  Service: General;  Laterality: N/A;  . Insertion of mesh N/A 01/10/2013    Procedure: INSERTION OF MESH;  Surgeon: Earnstine Regal, MD;  Location: [place];  Service: General;  Laterality: N/A;    There were no vitals filed for this visit.  Visit Diagnosis:  Right shoulder pain  Shoulder stiffness, right  Left shoulder pain  Shoulder stiffness, left      Subjective Assessment - 05/21/14 0922    Symptoms no complaints after last tx, continures to do ex's at home per MD instructions   Currently in Pain? Yes   Pain Score 2    Pain Location Shoulder   Pain Orientation Right   Pain Descriptors / Indicators Sore;Aching;Dull   Pain Onset More than a month ago   Aggravating Factors  certain movement   Pain Relieving Factors rest            [facility] PT Assessment - 05/21/14 0001    ROM / Strength   AROM / PROM / Strength PROM  PROM   Overall PROM  Deficits   PROM Assessment Site Shoulder   Right/Left Shoulder Right   Right Shoulder Flexion 117 Degrees   Right Shoulder External Rotation 57 Degrees                   [facility] Adult PT Treatment/Exercise - 05/21/14 0001    Manual Therapy   Manual Therapy Passive ROM   Passive ROM passive assistive ROM for right shoulder flex/ER with gentle range                  PT Short Term Goals - 05/09/14 1001    PT SHORT TERM GOAL #1   Title STG's=LTG's.   Time 4   Period Weeks   Status New           PT Long Term Goals - 05/09/14 1002    PT LONG TERM GOAL #1   Title Ind with advanced HEP.   Time 4   Period Weeks   Status New   PT LONG TERM GOAL #2   Title Active right shoulder flexion to 155 degrees so the patient can easily reach overhead   Time 4   Period Weeks   Status New   PT LONG TERM GOAL #3   Title Active ER to 70 degrees+ to allow for easily donning/doffing of apparel   Time 4    Period Weeks   Status New   PT LONG TERM GOAL #4   Title Increase ROM so patient is able to reach behind back to L3.   Time 4   Period Weeks   Status New   PT LONG TERM GOAL #5   Title Increase shoulder strength to a solid 4+/5 to increase stability for performance of functional activities   Time 4   Period Weeks   Status New   Additional Long Term Goals   Additional Long Term Goals Yes   PT LONG TERM GOAL #6   Title Perform ADL's with pain not > 3/10   Time 4   Period Weeks   Status New               Plan - 05/21/14 0936    Clinical Impression Statement pt tolerated tx with no complaints, and understands protocol for shoulder per MD. [place] [place] score 88%limitation today.goals ongoing   Pt will benefit from skilled therapeutic intervention in order to improve on the following deficits Decreased range of motion;Pain;Decreased activity tolerance   Rehab Potential Excellent   PT Frequency 3x / week   PT Duration 4 weeks   PT Next Visit Plan cont with POC for ROM   Consulted and Agree with Plan of Care Patient        Problem List Patient Active Problem List   Diagnosis Date Noted  . Incisional hernia 08/11/2011  . Renal insufficiency 07/28/2011  . PROSTATE CANCER 10/05/2009  . Coronary atherosclerosis 10/05/2009  . JOINT EFFUSION, KNEE 06/15/2009  . ABNORMAL EKG 10/07/2008  . PROSTATE CANCER, HX OF 10/31/2007  . GOUT 05/16/2007  . ELEVATED PROSTATE SPECIFIC ANTIGEN 04/14/2007  . Essential hypertension 04/13/2007  . DIVERTICULOSIS, COLON 04/13/2007  . DEGENERATIVE JOINT DISEASE, MODERATE 04/13/2007  . Hyperlipidemia 09/26/2006  . ADVEF, DRUG/MED/BIOL SUBST, OTHER DRUG NOS 09/26/2006    Edith Lord P, PTA 05/21/2014, 9:39 AM  [facility] [address] [place], [place], [address] Phone: [phone]   Fax:  [phone]

## 2014-05-26 ENCOUNTER — Ambulatory Visit: Payer: Medicare Other | Admitting: Physical Therapy

## 2014-05-26 ENCOUNTER — Encounter: Payer: Self-pay | Admitting: Physical Therapy

## 2014-05-26 DIAGNOSIS — M25511 Pain in right shoulder: Secondary | ICD-10-CM

## 2014-05-26 DIAGNOSIS — M25611 Stiffness of right shoulder, not elsewhere classified: Secondary | ICD-10-CM

## 2014-05-26 NOTE — Therapy (Signed)
[facility] [place], [place], [address] Phone: [phone]   Fax:  [phone]  Physical Therapy Treatment  Patient Details  Name: [name] [name]. MRN: [id number] Date of Birth: [birth year] Referring Provider:  [name], MD  Encounter Date: 05/26/2014      PT End of Session - 05/26/14 1027    Visit Number 6   Number of Visits 12   PT Start Time 0942   PT Stop Time 1026   PT Time Calculation (min) 44 min   Activity Tolerance Patient tolerated treatment well   Behavior During Therapy [facility] for tasks assessed/performed      Past Medical History  Diagnosis Date  . HYPERLIPIDEMIA 09/26/2006  . GOUT 05/16/2007  . HYPERTENSION 04/13/2007  . DIVERTICULOSIS, COLON 04/13/2007  . JOINT EFFUSION, KNEE 06/15/2009  . ELEVATED PROSTATE SPECIFIC ANTIGEN 04/14/2007  . ABNORMAL EKG 10/07/2008  . ADVEF, DRUG/MED/BIOL SUBST, OTHER DRUG NOS 09/26/2006  . Glaucoma     sees [facility] at [facility]   . Diabetes mellitus     type 2, diet controlled   . Renal insufficiency     RESOLVED PER PATIENT  . DEGENERATIVE JOINT DISEASE, MODERATE 04/13/2007  . PROSTATE CANCER 10/05/2009    sees [name]  . Melanoma     sees [name]     Past Surgical History  Procedure Laterality Date  . Vasectomy    . Tonsillectomy    . Bunionectomy      bilateral  . Prostatectomy      per [name]  . Cystectomy      benign from hand  . Colonoscopy  2007    at [facility], clear, repeat in 10 yrs   . Melanoma excision  2012    per [name]  . Knee arthroscopy  Jan. 2013    right knee, per [name]   . Umbilical hernia repair  11/01/2011    Procedure: HERNIA REPAIR UMBILICAL ADULT;  Surgeon: Earnstine Regal, MD;  Location: [place];  Service: General;  Laterality: N/A;  Repair umbilical hernia with mesh patch  . Glaucoma surgery Bilateral 2014   . Ventral hernia repair  01/10/2013    with mesh     [name]  . Ventral  hernia repair N/A 01/10/2013    Procedure: HERNIA REPAIR VENTRAL ADULT ;  Surgeon: Earnstine Regal, MD;  Location: [place];  Service: General;  Laterality: N/A;  . Insertion of mesh N/A 01/10/2013    Procedure: INSERTION OF MESH;  Surgeon: Earnstine Regal, MD;  Location: [place];  Service: General;  Laterality: N/A;    There were no vitals filed for this visit.  Visit Diagnosis:  Right shoulder pain  Shoulder stiffness, right      Subjective Assessment - 05/26/14 0943    Symptoms Patient reported no problems following last treatment. Patient stated that his R shoulder did well over the weekend and he completed his HEP 5x/day.   Currently in Pain? Yes   Pain Score 1    Pain Location Shoulder   Pain Orientation Right   Pain Descriptors / Indicators Sore   Pain Type Surgical pain   Pain Onset More than a month ago   Aggravating Factors  Sleeping in recliner   Pain Relieving Factors Rest                       [facility]  Adult PT Treatment/Exercise - 05/26/14 0001    Manual Therapy   Manual Therapy Passive ROM   Passive ROM passive assistive ROM for right shoulder flex/scap/ER/IR with gentle range                  PT Short Term Goals - 05/26/14 1030    PT SHORT TERM GOAL #1   Title STG's=LTG's.   Time 4   Period Weeks   Status On-going           PT Long Term Goals - 05/26/14 1031    PT LONG TERM GOAL #1   Title Ind with advanced HEP.   Time 4   Period Weeks   Status On-going   PT LONG TERM GOAL #2   Title Active right shoulder flexion to 155 degrees so the patient can easily reach overhead   Time 4   Period Weeks   Status On-going   PT LONG TERM GOAL #3   Title Active ER to 70 degrees+ to allow for easily donning/doffing of apparel   Time 4   Period Weeks   Status On-going   PT LONG TERM GOAL #4   Title Increase ROM so patient is able to reach behind back to L3.   Time 4   Period Weeks   Status On-going   PT LONG TERM GOAL #5   Title Increase  shoulder strength to a solid 4+/5 to increase stability for performance of functional activities   Time 4   Period Weeks   Status On-going   PT LONG TERM GOAL #6   Title Perform ADL's with pain not > 3/10   Time 4   Period Weeks   Status On-going               Plan - 05/26/14 1028    Clinical Impression Statement Patient tolerated treatment well today and denied pain following the treatment. All goals considered on-going due to protocol limitations. Patient experienced tightness into end-range for all directions of PROM/ AAROM.   Pt will benefit from skilled therapeutic intervention in order to improve on the following deficits Decreased range of motion;Pain;Decreased activity tolerance   Rehab Potential Excellent   PT Frequency 3x / week   PT Duration 4 weeks   PT Next Visit Plan Continue POC per PT and MD protocol. Assess ROM next session. Patient has MD appointment in 3 weeks.   Consulted and Agree with Plan of Care Patient        Problem List Patient Active Problem List   Diagnosis Date Noted  . Incisional hernia 08/11/2011  . Renal insufficiency 07/28/2011  . PROSTATE CANCER 10/05/2009  . Coronary atherosclerosis 10/05/2009  . JOINT EFFUSION, KNEE 06/15/2009  . ABNORMAL EKG 10/07/2008  . PROSTATE CANCER, HX OF 10/31/2007  . GOUT 05/16/2007  . ELEVATED PROSTATE SPECIFIC ANTIGEN 04/14/2007  . Essential hypertension 04/13/2007  . DIVERTICULOSIS, COLON 04/13/2007  . DEGENERATIVE JOINT DISEASE, MODERATE 04/13/2007  . Hyperlipidemia 09/26/2006  . ADVEF, DRUG/MED/BIOL SUBST, OTHER DRUG NOS 09/26/2006    Wynelle Fanny, PTA 05/26/2014, 10:34 AM  [facility] [address] [place], [place], [address] Phone: [phone]   Fax:  [phone]

## 2014-05-29 ENCOUNTER — Ambulatory Visit: Payer: Medicare Other | Admitting: Physical Therapy

## 2014-05-29 ENCOUNTER — Encounter: Payer: Self-pay | Admitting: Physical Therapy

## 2014-05-29 DIAGNOSIS — M25611 Stiffness of right shoulder, not elsewhere classified: Secondary | ICD-10-CM | POA: Diagnosis not present

## 2014-05-29 DIAGNOSIS — M25511 Pain in right shoulder: Secondary | ICD-10-CM | POA: Diagnosis not present

## 2014-05-29 NOTE — Therapy (Signed)
[facility] [place], [place], [address] Phone: [phone]   Fax:  [phone]  Physical Therapy Treatment  Patient Details  Name: [name] [name]. MRN: [id number] Date of Birth: 1945/11/27 Referring Provider:  [name], MD  Encounter Date: 05/29/2014      PT End of Session - 05/29/14 0945    Visit Number 7   Number of Visits 12   PT Start Time 0940   PT Stop Time 1027   PT Time Calculation (min) 47 min   Activity Tolerance Patient tolerated treatment well   Behavior During Therapy [facility] for tasks assessed/performed      Past Medical History  Diagnosis Date  . HYPERLIPIDEMIA 09/26/2006  . GOUT 05/16/2007  . HYPERTENSION 04/13/2007  . DIVERTICULOSIS, COLON 04/13/2007  . JOINT EFFUSION, KNEE 06/15/2009  . ELEVATED PROSTATE SPECIFIC ANTIGEN 04/14/2007  . ABNORMAL EKG 10/07/2008  . ADVEF, DRUG/MED/BIOL SUBST, OTHER DRUG NOS 09/26/2006  . Glaucoma     sees [facility] Clinic at [facility]   . Diabetes mellitus     type 2, diet controlled   . Renal insufficiency     RESOLVED PER PATIENT  . DEGENERATIVE JOINT DISEASE, MODERATE 04/13/2007  . PROSTATE CANCER 10/05/2009    sees [name]  . Melanoma     sees [name]     Past Surgical History  Procedure Laterality Date  . Vasectomy    . Tonsillectomy    . Bunionectomy      bilateral  . Prostatectomy      per [name]  . Cystectomy      benign from hand  . Colonoscopy  2007    at [facility], clear, repeat in 10 yrs   . Melanoma excision  2012    per [name]  . Knee arthroscopy  Jan. 2013    right knee, per [name]   . Umbilical hernia repair  11/01/2011    Procedure: HERNIA REPAIR UMBILICAL ADULT;  Surgeon: Earnstine Regal, MD;  Location: [place];  Service: General;  Laterality: N/A;  Repair umbilical hernia with mesh patch  . Glaucoma surgery Bilateral 2014   . Ventral hernia repair  01/10/2013    with mesh     [name]  . Ventral  hernia repair N/A 01/10/2013    Procedure: HERNIA REPAIR VENTRAL ADULT ;  Surgeon: Earnstine Regal, MD;  Location: [place];  Service: General;  Laterality: N/A;  . Insertion of mesh N/A 01/10/2013    Procedure: INSERTION OF MESH;  Surgeon: Earnstine Regal, MD;  Location: [place];  Service: General;  Laterality: N/A;    There were no vitals filed for this visit.  Visit Diagnosis:  Right shoulder pain  Shoulder stiffness, right      Subjective Assessment - 05/29/14 0941    Symptoms Patient reports that when he wakes up that his R shoulder is sore but gets better after doing his exercises in the morning. States that MD said he could take sling off to breath with pillow under his arm and in his chair. States that last night he was sitting and was trying to move his arm over and lifted his arm to move on pillow and felt small pop "as if muscle was moving."   Patient Stated Goals Get out of pain and use right arm again.   Currently in Pain? No/denies  Reports cramping in R deltoid area like he needs  to stretch.            [facility] PT Assessment - 05/29/14 0001    Assessment   Medical Diagnosis S/p right RTC surgery.   Onset Date 05/05/14   ROM / Strength   AROM / PROM / Strength PROM   PROM   Overall PROM  Deficits   Overall PROM Comments scaption 180   PROM Assessment Site Shoulder   Right/Left Shoulder Right   Right Shoulder Flexion 141 Degrees   Right Shoulder Internal Rotation 51 Degrees   Right Shoulder External Rotation 53 Degrees                   [facility] Adult PT Treatment/Exercise - 05/29/14 0001    Manual Therapy   Manual Therapy Passive ROM   Passive ROM passive assistive ROM for right shoulder flex/scap/ER/IR with gentle range                  PT Short Term Goals - 05/26/14 1030    PT SHORT TERM GOAL #1   Title STG's=LTG's.   Time 4   Period Weeks   Status On-going           PT Long Term Goals - 05/29/14 1024    PT LONG TERM GOAL #1   Title  Ind with advanced HEP.   Time 4   Period Weeks   Status On-going   PT LONG TERM GOAL #2   Title Active right shoulder flexion to 155 degrees so the patient can easily reach overhead   Time 4   Period Weeks   Status On-going  R shoulder 141 degrees PROM   PT LONG TERM GOAL #3   Title Active ER to 70 degrees+ to allow for easily donning/doffing of apparel   Time 4   Period Weeks   Status On-going  R shoulder 53 degrees PROM   PT LONG TERM GOAL #4   Title Increase ROM so patient is able to reach behind back to L3.   Time 4   Period Weeks   Status On-going   PT LONG TERM GOAL #5   Title Increase shoulder strength to a solid 4+/5 to increase stability for performance of functional activities   Time 4   Period Weeks   Status On-going   PT LONG TERM GOAL #6   Title Perform ADL's with pain not > 3/10   Time 4   Period Weeks   Status On-going  No pain associated with HEP just different sensations that before surgery.               Plan - 05/29/14 1032    Clinical Impression Statement Patient tolerated treatment well today and denied any pain following the treatment. PROM measurements continue to improve although all goals remain on-going. Patient experienced tightness at the beginning of each direction of PROM but did not complain of the tightness as treatment went on.   Pt will benefit from skilled therapeutic intervention in order to improve on the following deficits Decreased range of motion;Pain;Decreased activity tolerance   Rehab Potential Excellent   PT Frequency 3x / week   PT Duration 4 weeks   PT Next Visit Plan Continue POC per PT and MD protocol.   Consulted and Agree with Plan of Care Patient        Problem List Patient Active Problem List   Diagnosis Date Noted  . Incisional hernia 08/11/2011  . Renal insufficiency 07/28/2011  . PROSTATE CANCER  10/05/2009  . Coronary atherosclerosis 10/05/2009  . JOINT EFFUSION, KNEE 06/15/2009  . ABNORMAL EKG  10/07/2008  . PROSTATE CANCER, HX OF 10/31/2007  . GOUT 05/16/2007  . ELEVATED PROSTATE SPECIFIC ANTIGEN 04/14/2007  . Essential hypertension 04/13/2007  . DIVERTICULOSIS, COLON 04/13/2007  . DEGENERATIVE JOINT DISEASE, MODERATE 04/13/2007  . Hyperlipidemia 09/26/2006  . ADVEF, DRUG/MED/BIOL SUBST, OTHER DRUG NOS 09/26/2006    Wynelle Fanny, PTA 05/29/2014, 10:37 AM  [facility] [address] [place], [place], [address] Phone: [phone]   Fax:  [phone]

## 2014-06-02 ENCOUNTER — Ambulatory Visit: Payer: Medicare Other | Admitting: Physical Therapy

## 2014-06-02 ENCOUNTER — Encounter: Payer: Self-pay | Admitting: Physical Therapy

## 2014-06-02 DIAGNOSIS — M25611 Stiffness of right shoulder, not elsewhere classified: Secondary | ICD-10-CM | POA: Diagnosis not present

## 2014-06-02 DIAGNOSIS — M25511 Pain in right shoulder: Secondary | ICD-10-CM | POA: Diagnosis not present

## 2014-06-02 NOTE — Therapy (Signed)
[facility] [place], [place], [address] Phone: [phone]   Fax:  [phone]  Physical Therapy Treatment  Patient Details  Name: [name] [name]. MRN: [id number] Date of Birth: 05-Dec-1945 Referring Provider:  [name], MD  Encounter Date: 06/02/2014      PT End of Session - 06/02/14 1340    Visit Number 8   Number of Visits 12   PT Start Time 1314   PT Stop Time 1338   PT Time Calculation (min) 39 min   Activity Tolerance Patient tolerated treatment well   Behavior During Therapy [facility] for tasks assessed/performed      Past Medical History  Diagnosis Date  . HYPERLIPIDEMIA 09/26/2006  . GOUT 05/16/2007  . HYPERTENSION 04/13/2007  . DIVERTICULOSIS, COLON 04/13/2007  . JOINT EFFUSION, KNEE 06/15/2009  . ELEVATED PROSTATE SPECIFIC ANTIGEN 04/14/2007  . ABNORMAL EKG 10/07/2008  . ADVEF, DRUG/MED/BIOL SUBST, OTHER DRUG NOS 09/26/2006  . Glaucoma     sees Eye Clinic at [facility]   . Diabetes mellitus     type 2, diet controlled   . Renal insufficiency     RESOLVED PER PATIENT  . DEGENERATIVE JOINT DISEASE, MODERATE 04/13/2007  . PROSTATE CANCER 10/05/2009    sees [name]  . Melanoma     sees [name]     Past Surgical History  Procedure Laterality Date  . Vasectomy    . Tonsillectomy    . Bunionectomy      bilateral  . Prostatectomy      per [name]  . Cystectomy      benign from hand  . Colonoscopy  2007    at [facility], clear, repeat in 10 yrs   . Melanoma excision  2012    per [name]  . Knee arthroscopy  Jan. 2013    right knee, per [name]   . Umbilical hernia repair  11/01/2011    Procedure: HERNIA REPAIR UMBILICAL ADULT;  Surgeon: Earnstine Regal, MD;  Location: [place];  Service: General;  Laterality: N/A;  Repair umbilical hernia with mesh patch  . Glaucoma surgery Bilateral 2014   . Ventral hernia repair  01/10/2013    with mesh     [name]  . Ventral  hernia repair N/A 01/10/2013    Procedure: HERNIA REPAIR VENTRAL ADULT ;  Surgeon: Earnstine Regal, MD;  Location: [place];  Service: General;  Laterality: N/A;  . Insertion of mesh N/A 01/10/2013    Procedure: INSERTION OF MESH;  Surgeon: Earnstine Regal, MD;  Location: [place];  Service: General;  Laterality: N/A;    There were no vitals filed for this visit.  Visit Diagnosis:  Right shoulder pain  Shoulder stiffness, right      Subjective Assessment - 06/02/14 1301    Symptoms Patient states that his shoulder is good and he has already completed HEP twice today. Rode to [place] this weekend and a little sore from the road conditions. Reports small popping sensation in R shoulder with no pain which he attributed to scar tissue breaking up.   Patient Stated Goals Get out of pain and use right arm again.   Currently in Pain? No/denies            [facility] PT Assessment - 06/02/14 0001    Assessment   Medical Diagnosis S/p right RTC surgery.   Onset Date 05/05/14   Next MD Visit  06/12/2014                   [facility] Adult PT Treatment/Exercise - 06/02/14 0001    Manual Therapy   Manual Therapy Passive ROM   Passive ROM passive assistive ROM for right shoulder flex/scap/ER/IR with gentle range                  PT Short Term Goals - 05/26/14 1030    PT SHORT TERM GOAL #1   Title STG's=LTG's.   Time 4   Period Weeks   Status On-going           PT Long Term Goals - 05/29/14 1024    PT LONG TERM GOAL #1   Title Ind with advanced HEP.   Time 4   Period Weeks   Status On-going   PT LONG TERM GOAL #2   Title Active right shoulder flexion to 155 degrees so the patient can easily reach overhead   Time 4   Period Weeks   Status On-going  R shoulder 141 degrees PROM   PT LONG TERM GOAL #3   Title Active ER to 70 degrees+ to allow for easily donning/doffing of apparel   Time 4   Period Weeks   Status On-going  R shoulder 53 degrees PROM   PT LONG TERM GOAL #4    Title Increase ROM so patient is able to reach behind back to L3.   Time 4   Period Weeks   Status On-going   PT LONG TERM GOAL #5   Title Increase shoulder strength to a solid 4+/5 to increase stability for performance of functional activities   Time 4   Period Weeks   Status On-going   PT LONG TERM GOAL #6   Title Perform ADL's with pain not > 3/10   Time 4   Period Weeks   Status On-going  No pain associated with HEP just different sensations that before surgery.               Plan - 06/02/14 1341    Clinical Impression Statement Patient tolerated treatment well and denied pain following the session. Tightness decreased as PROM reps increased per patient experience. All goals remain on-going.   Pt will benefit from skilled therapeutic intervention in order to improve on the following deficits Decreased range of motion;Pain;Decreased activity tolerance   Rehab Potential Excellent   PT Frequency 3x / week   PT Duration 4 weeks   PT Next Visit Plan Continue POC per PT and MD protocol.        Problem List Patient Active Problem List   Diagnosis Date Noted  . Incisional hernia 08/11/2011  . Renal insufficiency 07/28/2011  . PROSTATE CANCER 10/05/2009  . Coronary atherosclerosis 10/05/2009  . JOINT EFFUSION, KNEE 06/15/2009  . ABNORMAL EKG 10/07/2008  . PROSTATE CANCER, HX OF 10/31/2007  . GOUT 05/16/2007  . ELEVATED PROSTATE SPECIFIC ANTIGEN 04/14/2007  . Essential hypertension 04/13/2007  . DIVERTICULOSIS, COLON 04/13/2007  . DEGENERATIVE JOINT DISEASE, MODERATE 04/13/2007  . Hyperlipidemia 09/26/2006  . ADVEF, DRUG/MED/BIOL SUBST, OTHER DRUG NOS 09/26/2006    Wynelle Fanny, PTA 06/02/2014, 1:46 PM  [facility] [address] [place], [place], [address] Phone: [phone]   Fax:  [phone]

## 2014-06-05 ENCOUNTER — Ambulatory Visit: Payer: Medicare Other | Admitting: Physical Therapy

## 2014-06-05 ENCOUNTER — Encounter: Payer: Self-pay | Admitting: Physical Therapy

## 2014-06-05 DIAGNOSIS — M25611 Stiffness of right shoulder, not elsewhere classified: Secondary | ICD-10-CM | POA: Diagnosis not present

## 2014-06-05 DIAGNOSIS — M25511 Pain in right shoulder: Secondary | ICD-10-CM

## 2014-06-05 DIAGNOSIS — M25612 Stiffness of left shoulder, not elsewhere classified: Secondary | ICD-10-CM

## 2014-06-05 DIAGNOSIS — M25512 Pain in left shoulder: Secondary | ICD-10-CM

## 2014-06-05 NOTE — Therapy (Signed)
[facility] [place], [place], [address] Phone: [phone]   Fax:  [phone]  Physical Therapy Treatment  Patient Details  Name: [name] [name]. MRN: [id number] Date of Birth: 13-Feb-1946 Referring Provider:  [name], MD  Encounter Date: 06/05/2014      PT End of Session - 06/05/14 1018    Visit Number 9   Number of Visits 12   PT Start Time 0946   PT Stop Time 1018   PT Time Calculation (min) 32 min   Activity Tolerance Patient tolerated treatment well   Behavior During Therapy [facility] for tasks assessed/performed      Past Medical History  Diagnosis Date  . HYPERLIPIDEMIA 09/26/2006  . GOUT 05/16/2007  . HYPERTENSION 04/13/2007  . DIVERTICULOSIS, COLON 04/13/2007  . JOINT EFFUSION, KNEE 06/15/2009  . ELEVATED PROSTATE SPECIFIC ANTIGEN 04/14/2007  . ABNORMAL EKG 10/07/2008  . ADVEF, DRUG/MED/BIOL SUBST, OTHER DRUG NOS 09/26/2006  . Glaucoma     sees [facility] at [facility]   . Diabetes mellitus     type 2, diet controlled   . Renal insufficiency     RESOLVED PER PATIENT  . DEGENERATIVE JOINT DISEASE, MODERATE 04/13/2007  . PROSTATE CANCER 10/05/2009    sees [name]  . Melanoma     sees [name]     Past Surgical History  Procedure Laterality Date  . Vasectomy    . Tonsillectomy    . Bunionectomy      bilateral  . Prostatectomy      per [name]  . Cystectomy      benign from hand  . Colonoscopy  2007    at [facility], clear, repeat in 10 yrs   . Melanoma excision  2012    per [name]  . Knee arthroscopy  Jan. 2013    right knee, per [name]   . Umbilical hernia repair  11/01/2011    Procedure: HERNIA REPAIR UMBILICAL ADULT;  Surgeon: Earnstine Regal, MD;  Location: [place];  Service: General;  Laterality: N/A;  Repair umbilical hernia with mesh patch  . Glaucoma surgery Bilateral 2014   . Ventral hernia repair  01/10/2013    with mesh     [name]  . Ventral  hernia repair N/A 01/10/2013    Procedure: HERNIA REPAIR VENTRAL ADULT ;  Surgeon: Earnstine Regal, MD;  Location: [place];  Service: General;  Laterality: N/A;  . Insertion of mesh N/A 01/10/2013    Procedure: INSERTION OF MESH;  Surgeon: Earnstine Regal, MD;  Location: [place];  Service: General;  Laterality: N/A;    There were no vitals filed for this visit.  Visit Diagnosis:  Right shoulder pain  Shoulder stiffness, right  Left shoulder pain  Shoulder stiffness, left      Subjective Assessment - 06/05/14 0950    Symptoms sore today and some stiffness from sleeping in chair   Patient Stated Goals Get out of pain and use right arm again.   Currently in Pain? Yes   Pain Score 1    Pain Location Shoulder   Pain Orientation Right   Pain Descriptors / Indicators Sore   Pain Type Surgical pain   Pain Onset More than a month ago   Aggravating Factors  sleeping in chair   Pain Relieving Factors rest            [facility] PT Assessment - 06/05/14  0001    ROM / Strength   AROM / PROM / Strength PROM   PROM   Right Shoulder Flexion 150 Degrees   Right Shoulder External Rotation 55 Degrees                   [facility] Adult PT Treatment/Exercise - 06/05/14 0001    Manual Therapy   Manual Therapy Passive ROM   Passive ROM gentle passive assistive ROM for flexion/ER                  PT Short Term Goals - 05/26/14 1030    PT SHORT TERM GOAL #1   Title STG's=LTG's.   Time 4   Period Weeks   Status On-going           PT Long Term Goals - 05/29/14 1024    PT LONG TERM GOAL #1   Title Ind with advanced HEP.   Time 4   Period Weeks   Status On-going   PT LONG TERM GOAL #2   Title Active right shoulder flexion to 155 degrees so the patient can easily reach overhead   Time 4   Period Weeks   Status On-going  R shoulder 141 degrees PROM   PT LONG TERM GOAL #3   Title Active ER to 70 degrees+ to allow for easily donning/doffing of apparel   Time 4   Period  Weeks   Status On-going  R shoulder 53 degrees PROM   PT LONG TERM GOAL #4   Title Increase ROM so patient is able to reach behind back to L3.   Time 4   Period Weeks   Status On-going   PT LONG TERM GOAL #5   Title Increase shoulder strength to a solid 4+/5 to increase stability for performance of functional activities   Time 4   Period Weeks   Status On-going   PT LONG TERM GOAL #6   Title Perform ADL's with pain not > 3/10   Time 4   Period Weeks   Status On-going  No pain associated with HEP just different sensations that before surgery.               Plan - 06/05/14 1018    Clinical Impression Statement pt tolerated tx well today. has improved PAAROM and no pain increase. goals ongoing.    Pt will benefit from skilled therapeutic intervention in order to improve on the following deficits Decreased range of motion;Pain;Decreased activity tolerance   Rehab Potential Excellent   PT Frequency 3x / week   PT Duration 4 weeks   PT Next Visit Plan Continue POC per PT and MD protocol. MD next thursday   Consulted and Agree with Plan of Care Patient        Problem List Patient Active Problem List   Diagnosis Date Noted  . Incisional hernia 08/11/2011  . Renal insufficiency 07/28/2011  . PROSTATE CANCER 10/05/2009  . Coronary atherosclerosis 10/05/2009  . JOINT EFFUSION, KNEE 06/15/2009  . ABNORMAL EKG 10/07/2008  . PROSTATE CANCER, HX OF 10/31/2007  . GOUT 05/16/2007  . ELEVATED PROSTATE SPECIFIC ANTIGEN 04/14/2007  . Essential hypertension 04/13/2007  . DIVERTICULOSIS, COLON 04/13/2007  . DEGENERATIVE JOINT DISEASE, MODERATE 04/13/2007  . Hyperlipidemia 09/26/2006  . ADVEF, DRUG/MED/BIOL SUBST, OTHER DRUG NOS 09/26/2006    Skanda Worlds P, PTA 06/05/2014, 10:22 AM  [facility] [address] [place], [place], [address] Phone: [phone]   Fax:  [phone]

## 2014-06-09 ENCOUNTER — Encounter: Payer: Self-pay | Admitting: Physical Therapy

## 2014-06-09 ENCOUNTER — Ambulatory Visit: Payer: Medicare Other | Attending: Orthopedic Surgery | Admitting: Physical Therapy

## 2014-06-09 DIAGNOSIS — M25511 Pain in right shoulder: Secondary | ICD-10-CM | POA: Insufficient documentation

## 2014-06-09 DIAGNOSIS — M25612 Stiffness of left shoulder, not elsewhere classified: Secondary | ICD-10-CM

## 2014-06-09 DIAGNOSIS — M25512 Pain in left shoulder: Secondary | ICD-10-CM

## 2014-06-09 DIAGNOSIS — M25611 Stiffness of right shoulder, not elsewhere classified: Secondary | ICD-10-CM | POA: Diagnosis not present

## 2014-06-09 NOTE — Therapy (Signed)
[facility] [place], [place], [address] Phone: [phone]   Fax:  [phone]  Physical Therapy Treatment  Patient Details  Name: [name] [name]. MRN: [id number] Date of Birth: 08-20-1945 Referring Provider:  [name], MD  Encounter Date: 06/09/2014      PT End of Session - 06/09/14 1106    Visit Number 10   Number of Visits 12   PT Start Time 2229   PT Stop Time 1109   PT Time Calculation (min) 40 min   Activity Tolerance Patient tolerated treatment well   Behavior During Therapy [facility] for tasks assessed/performed      Past Medical History  Diagnosis Date  . HYPERLIPIDEMIA 09/26/2006  . GOUT 05/16/2007  . HYPERTENSION 04/13/2007  . DIVERTICULOSIS, COLON 04/13/2007  . JOINT EFFUSION, KNEE 06/15/2009  . ELEVATED PROSTATE SPECIFIC ANTIGEN 04/14/2007  . ABNORMAL EKG 10/07/2008  . ADVEF, DRUG/MED/BIOL SUBST, OTHER DRUG NOS 09/26/2006  . Glaucoma     sees [facility] at [facility]   . Diabetes mellitus     type 2, diet controlled   . Renal insufficiency     RESOLVED PER PATIENT  . DEGENERATIVE JOINT DISEASE, MODERATE 04/13/2007  . PROSTATE CANCER 10/05/2009    sees [name]  . Melanoma     sees [name]     Past Surgical History  Procedure Laterality Date  . Vasectomy    . Tonsillectomy    . Bunionectomy      bilateral  . Prostatectomy      per [name]  . Cystectomy      benign from hand  . Colonoscopy  2007    at [facility], clear, repeat in 10 yrs   . Melanoma excision  2012    per [name]  . Knee arthroscopy  Jan. 2013    right knee, per [name]   . Umbilical hernia repair  11/01/2011    Procedure: HERNIA REPAIR UMBILICAL ADULT;  Surgeon: Earnstine Regal, MD;  Location: [place];  Service: General;  Laterality: N/A;  Repair umbilical hernia with mesh patch  . Glaucoma surgery Bilateral 2014   . Ventral hernia repair  01/10/2013    with mesh     [name]  . Ventral  hernia repair N/A 01/10/2013    Procedure: HERNIA REPAIR VENTRAL ADULT ;  Surgeon: Earnstine Regal, MD;  Location: [place];  Service: General;  Laterality: N/A;  . Insertion of mesh N/A 01/10/2013    Procedure: INSERTION OF MESH;  Surgeon: Earnstine Regal, MD;  Location: [place];  Service: General;  Laterality: N/A;    There were no vitals filed for this visit.  Visit Diagnosis:  Right shoulder pain  Shoulder stiffness, right  Left shoulder pain  Shoulder stiffness, left      Subjective Assessment - 06/09/14 1032    Subjective did good after last tx, doing ex's 6 times a day, some soreness today   Currently in Pain? Yes   Pain Score 1    Pain Location Shoulder   Pain Orientation Right   Pain Descriptors / Indicators Sore   Pain Type Surgical pain   Pain Onset More than a month ago   Aggravating Factors  sleeping in chair   Pain Relieving Factors rest            [facility] PT Assessment - 06/09/14 0001    ROM / Strength  AROM / PROM / Strength PROM   PROM   Right Shoulder Flexion 150 Degrees   Right Shoulder External Rotation 60 Degrees                   [facility] Adult PT Treatment/Exercise - 06/09/14 0001    Manual Therapy   Manual Therapy Passive ROM   Passive ROM gentle passive assistive ROM for flexion/ER                  PT Short Term Goals - 05/26/14 1030    PT SHORT TERM GOAL #1   Title STG's=LTG's.   Time 4   Period Weeks   Status On-going           PT Long Term Goals - 05/29/14 1024    PT LONG TERM GOAL #1   Title Ind with advanced HEP.   Time 4   Period Weeks   Status On-going   PT LONG TERM GOAL #2   Title Active right shoulder flexion to 155 degrees so the patient can easily reach overhead   Time 4   Period Weeks   Status On-going  R shoulder 141 degrees PROM   PT LONG TERM GOAL #3   Title Active ER to 70 degrees+ to allow for easily donning/doffing of apparel   Time 4   Period Weeks   Status On-going  R shoulder 53 degrees  PROM   PT LONG TERM GOAL #4   Title Increase ROM so patient is able to reach behind back to L3.   Time 4   Period Weeks   Status On-going   PT LONG TERM GOAL #5   Title Increase shoulder strength to a solid 4+/5 to increase stability for performance of functional activities   Time 4   Period Weeks   Status On-going   PT LONG TERM GOAL #6   Title Perform ADL's with pain not > 3/10   Time 4   Period Weeks   Status On-going  No pain associated with HEP just different sensations that before surgery.               Plan - 06/09/14 1110    Clinical Impression Statement pt tolerated tx well and has improved with ER P/AAROM, pt is going to MD thursday and [place]. pt will continue doing ex's per MD(ER AROM sitting and pendulum)goals ongoing   Pt will benefit from skilled therapeutic intervention in order to improve on the following deficits Decreased range of motion;Pain;Decreased activity tolerance   Rehab Potential Excellent   PT Frequency 3x / week   PT Duration 4 weeks   PT Next Visit Plan Cont with POC per protocol. MD note / Renewal needed next visit    Consulted and Agree with Plan of Care Patient        Problem List Patient Active Problem List   Diagnosis Date Noted  . Incisional hernia 08/11/2011  . Renal insufficiency 07/28/2011  . PROSTATE CANCER 10/05/2009  . Coronary atherosclerosis 10/05/2009  . JOINT EFFUSION, KNEE 06/15/2009  . ABNORMAL EKG 10/07/2008  . PROSTATE CANCER, HX OF 10/31/2007  . GOUT 05/16/2007  . ELEVATED PROSTATE SPECIFIC ANTIGEN 04/14/2007  . Essential hypertension 04/13/2007  . DIVERTICULOSIS, COLON 04/13/2007  . DEGENERATIVE JOINT DISEASE, MODERATE 04/13/2007  . Hyperlipidemia 09/26/2006  . ADVEF, DRUG/MED/BIOL SUBST, OTHER DRUG NOS 09/26/2006   Ladean Raya, PTA 06/09/2014 11:14 AM Gerda Yin P, PTA 06/09/2014, 11:14  [facility] [address] [place], [place], [address] Phone: [phone]   Fax:  [phone]

## 2014-06-12 ENCOUNTER — Encounter: Payer: Self-pay | Admitting: *Deleted

## 2014-06-12 ENCOUNTER — Ambulatory Visit: Payer: Medicare Other | Admitting: *Deleted

## 2014-06-12 DIAGNOSIS — M25611 Stiffness of right shoulder, not elsewhere classified: Secondary | ICD-10-CM

## 2014-06-12 DIAGNOSIS — S43431D Superior glenoid labrum lesion of right shoulder, subsequent encounter: Secondary | ICD-10-CM | POA: Diagnosis not present

## 2014-06-12 DIAGNOSIS — Z4789 Encounter for other orthopedic aftercare: Secondary | ICD-10-CM | POA: Diagnosis not present

## 2014-06-12 DIAGNOSIS — M25512 Pain in left shoulder: Secondary | ICD-10-CM

## 2014-06-12 DIAGNOSIS — M25612 Stiffness of left shoulder, not elsewhere classified: Secondary | ICD-10-CM

## 2014-06-12 DIAGNOSIS — M25511 Pain in right shoulder: Secondary | ICD-10-CM | POA: Diagnosis not present

## 2014-06-12 NOTE — Therapy (Signed)
[facility] [place], [place], [address] Phone: [phone]   Fax:  [phone]  Physical Therapy Treatment  Patient Details  Name: [name] [name]. MRN: [id number] Date of Birth: June 15, 1945 Referring Provider:  [name], MD  Encounter Date: 06/12/2014      PT End of Session - 06/12/14 1023    Visit Number 11   Number of Visits 12   PT Start Time 0946   PT Stop Time 5366   PT Time Calculation (min) 47 min      Past Medical History  Diagnosis Date  . HYPERLIPIDEMIA 09/26/2006  . GOUT 05/16/2007  . HYPERTENSION 04/13/2007  . DIVERTICULOSIS, COLON 04/13/2007  . JOINT EFFUSION, KNEE 06/15/2009  . ELEVATED PROSTATE SPECIFIC ANTIGEN 04/14/2007  . ABNORMAL EKG 10/07/2008  . ADVEF, DRUG/MED/BIOL SUBST, OTHER DRUG NOS 09/26/2006  . Glaucoma     sees [facility] Clinic at [facility]   . Diabetes mellitus     type 2, diet controlled   . Renal insufficiency     RESOLVED PER PATIENT  . DEGENERATIVE JOINT DISEASE, MODERATE 04/13/2007  . PROSTATE CANCER 10/05/2009    sees [name]  . Melanoma     sees [name]     Past Surgical History  Procedure Laterality Date  . Vasectomy    . Tonsillectomy    . Bunionectomy      bilateral  . Prostatectomy      per [name]  . Cystectomy      benign from hand  . Colonoscopy  2007    at [facility], clear, repeat in 10 yrs   . Melanoma excision  2012    per [name]  . Knee arthroscopy  Jan. 2013    right knee, per [name]   . Umbilical hernia repair  11/01/2011    Procedure: HERNIA REPAIR UMBILICAL ADULT;  Surgeon: Earnstine Regal, MD;  Location: [place];  Service: General;  Laterality: N/A;  Repair umbilical hernia with mesh patch  . Glaucoma surgery Bilateral 2014   . Ventral hernia repair  01/10/2013    with mesh     [name]  . Ventral hernia repair N/A 01/10/2013    Procedure: HERNIA REPAIR VENTRAL ADULT ;  Surgeon: Earnstine Regal, MD;  Location: [place];  Service: General;  Laterality: N/A;  . Insertion of mesh N/A 01/10/2013    Procedure: INSERTION OF MESH;  Surgeon: Earnstine Regal, MD;  Location: [place];  Service: General;  Laterality: N/A;    There were no vitals filed for this visit.  Visit Diagnosis:  Right shoulder pain  Shoulder stiffness, right  Left shoulder pain  Shoulder stiffness, left      Subjective Assessment - 06/12/14 0951    Subjective did good after last tx, doing ex's 6 times a day, some soreness today. To MD today   Patient Stated Goals Get out of pain and use right arm again.   Currently in Pain? Yes   Pain Score 2    Pain Location Shoulder   Pain Orientation Right   Pain Descriptors / Indicators Sore   Pain Type Surgical pain   Pain Onset More than a month ago            [facility] PT Assessment - 06/12/14 0001    ROM / Strength   AROM / PROM / Strength PROM   PROM   Right Shoulder Flexion  150 Degrees   Right Shoulder External Rotation 60 Degrees                   [facility] Adult PT Treatment/Exercise - 06/12/14 0001    Manual Therapy   Manual Therapy Passive ROM;Myofascial release   Myofascial Release scar mobs   Passive ROM gentle passive assistive ROM for flexion/ER and scapular mobs                  PT Short Term Goals - 05/26/14 1030    PT SHORT TERM GOAL #1   Title STG's=LTG's.   Time 4   Period Weeks   Status On-going           PT Long Term Goals - 05/29/14 1024    PT LONG TERM GOAL #1   Title Ind with advanced HEP.   Time 4   Period Weeks   Status On-going   PT LONG TERM GOAL #2   Title Active right shoulder flexion to 155 degrees so the patient can easily reach overhead   Time 4   Period Weeks   Status On-going  R shoulder 141 degrees PROM   PT LONG TERM GOAL #3   Title Active ER to 70 degrees+ to allow for easily donning/doffing of apparel   Time 4   Period Weeks   Status On-going  R shoulder 53 degrees PROM   PT LONG TERM GOAL #4   Title  Increase ROM so patient is able to reach behind back to L3.   Time 4   Period Weeks   Status On-going   PT LONG TERM GOAL #5   Title Increase shoulder strength to a solid 4+/5 to increase stability for performance of functional activities   Time 4   Period Weeks   Status On-going   PT LONG TERM GOAL #6   Title Perform ADL's with pain not > 3/10   Time 4   Period Weeks   Status On-going  No pain associated with HEP just different sensations that before surgery.               Plan - 06/12/14 1026    Clinical Impression Statement Pt did great with PT today. Will cont. as per MD   Pt will benefit from skilled therapeutic intervention in order to improve on the following deficits Decreased range of motion;Pain;Decreased activity tolerance   Rehab Potential Excellent   PT Frequency 3x / week   PT Duration 4 weeks   PT Next Visit Plan Cont with POC per protocol. MD note sent   Consulted and Agree with Plan of Care Patient        Problem List Patient Active Problem List   Diagnosis Date Noted  . Incisional hernia 08/11/2011  . Renal insufficiency 07/28/2011  . PROSTATE CANCER 10/05/2009  . Coronary atherosclerosis 10/05/2009  . JOINT EFFUSION, KNEE 06/15/2009  . ABNORMAL EKG 10/07/2008  . PROSTATE CANCER, HX OF 10/31/2007  . GOUT 05/16/2007  . ELEVATED PROSTATE SPECIFIC ANTIGEN 04/14/2007  . Essential hypertension 04/13/2007  . DIVERTICULOSIS, COLON 04/13/2007  . DEGENERATIVE JOINT DISEASE, MODERATE 04/13/2007  . Hyperlipidemia 09/26/2006  . ADVEF, DRUG/MED/BIOL SUBST, OTHER DRUG NOS 09/26/2006  Levonne Lapping, PTA 06/12/2014 10:40 AM  Phares Zaccone,CHRIS, PTA 06/12/2014, 10:38 AM  [facility] [address] [place], [place], [address] Phone: [phone]   Fax:  [phone]

## 2014-06-16 ENCOUNTER — Encounter: Payer: PRIVATE HEALTH INSURANCE | Admitting: Physical Therapy

## 2014-06-19 ENCOUNTER — Encounter: Payer: PRIVATE HEALTH INSURANCE | Admitting: Physical Therapy

## 2014-06-23 ENCOUNTER — Encounter: Payer: Self-pay | Admitting: Physical Therapy

## 2014-06-23 ENCOUNTER — Ambulatory Visit: Payer: Medicare Other | Admitting: Physical Therapy

## 2014-06-23 DIAGNOSIS — M25611 Stiffness of right shoulder, not elsewhere classified: Secondary | ICD-10-CM | POA: Diagnosis not present

## 2014-06-23 DIAGNOSIS — M25511 Pain in right shoulder: Secondary | ICD-10-CM | POA: Diagnosis not present

## 2014-06-23 NOTE — Therapy (Signed)
[facility] [place], [place], [address] Phone: [phone]   Fax:  [phone]  Physical Therapy Treatment  Patient Details  Name: [name] [name]. MRN: [id number] Date of Birth: 07/14/1945 Referring Provider:  [name], MD  Encounter Date: 06/23/2014      PT End of Session - 06/23/14 0949    Visit Number 12   Number of Visits 18   PT Start Time 0947   PT Stop Time 1033   PT Time Calculation (min) 46 min   Activity Tolerance Patient tolerated treatment well   Behavior During Therapy [facility] for tasks assessed/performed      Past Medical History  Diagnosis Date  . HYPERLIPIDEMIA 09/26/2006  . GOUT 05/16/2007  . HYPERTENSION 04/13/2007  . DIVERTICULOSIS, COLON 04/13/2007  . JOINT EFFUSION, KNEE 06/15/2009  . ELEVATED PROSTATE SPECIFIC ANTIGEN 04/14/2007  . ABNORMAL EKG 10/07/2008  . ADVEF, DRUG/MED/BIOL SUBST, OTHER DRUG NOS 09/26/2006  . Glaucoma     sees [facility] Clinic at [facility]   . Diabetes mellitus     type 2, diet controlled   . Renal insufficiency     RESOLVED PER PATIENT  . DEGENERATIVE JOINT DISEASE, MODERATE 04/13/2007  . PROSTATE CANCER 10/05/2009    sees [name]  . Melanoma     sees [name]     Past Surgical History  Procedure Laterality Date  . Vasectomy    . Tonsillectomy    . Bunionectomy      bilateral  . Prostatectomy      per [name]  . Cystectomy      benign from hand  . Colonoscopy  2007    at [facility], clear, repeat in 10 yrs   . Melanoma excision  2012    per [name]  . Knee arthroscopy  Jan. 2013    right knee, per [name]   . Umbilical hernia repair  11/01/2011    Procedure: HERNIA REPAIR UMBILICAL ADULT;  Surgeon: Earnstine Regal, MD;  Location: [place];  Service: General;  Laterality: N/A;  Repair umbilical hernia with mesh patch  . Glaucoma surgery Bilateral 2014   . Ventral hernia repair  01/10/2013    with mesh     [name]  . Ventral  hernia repair N/A 01/10/2013    Procedure: HERNIA REPAIR VENTRAL ADULT ;  Surgeon: Earnstine Regal, MD;  Location: [place];  Service: General;  Laterality: N/A;  . Insertion of mesh N/A 01/10/2013    Procedure: INSERTION OF MESH;  Surgeon: Earnstine Regal, MD;  Location: [place];  Service: General;  Laterality: N/A;    There were no vitals filed for this visit.  Visit Diagnosis:  Right shoulder pain  Shoulder stiffness, right      Subjective Assessment - 06/23/14 0952    Subjective States that he has been completing exericses that [name] instructed for him over the past week. Has been taking sling off at home and doing "light" exercises.   Patient Stated Goals Get out of pain and use right arm again.   Currently in Pain? Yes   Pain Score 2    Pain Location Shoulder   Pain Orientation Right   Pain Descriptors / Indicators Sore   Pain Type Surgical pain   Pain Onset More than a month ago   Pain Frequency Intermittent   Aggravating Factors  Sleeping in LazyBoy   Pain Relieving Factors rest,  exercises            [facility] PT Assessment - 06/23/14 0001    Assessment   Medical Diagnosis S/p right RTC surgery.   Onset Date 05/05/14   Next MD Visit 07/12/2014                   [facility] Adult PT Treatment/Exercise - 06/23/14 0001    Exercises   Exercises Shoulder   Shoulder Exercises: Supine   Flexion AAROM;Right;Other (comment)  3 sets 10 reps   Other Supine Exercises Supine chest press AAROM x 30 reps   Shoulder Exercises: Seated   External Rotation AAROM;Right;Other (comment)  x 30 reps   Internal Rotation AAROM;Right;5 reps;Other (comment)  pushing with LLE to touch the back of his head   Shoulder Exercises: Standing   Other Standing Exercises wall pushups x10 reps   Shoulder Exercises: Pulleys   Flexion 3 minutes   Shoulder Exercises: Isometric Strengthening   Flexion Other (comment)  5 sec hold x 20 reps   Extension Other (comment)  5 sec hold x 20 reps    External Rotation Other (comment)  5 sec hold x 20 reps   ABduction Other (comment)  5 sec hold x 20 reps   Shoulder Exercises: Stretch   Table Stretch - Flexion Other (comment)  10 reps x 3 sec hold; pt had questions regarding technique                PT Education - 06/23/14 1018    Education provided Yes   Education Details HEP- isometric flexion, extension, abduction, ER   Person(s) Educated Patient   Methods Explanation;Demonstration;Verbal cues;Handout   Comprehension Verbalized understanding;Returned demonstration;Verbal cues required          PT Short Term Goals - 05/26/14 1030    PT SHORT TERM GOAL #1   Title STG's=LTG's.   Time 4   Period Weeks   Status On-going           PT Long Term Goals - 05/29/14 1024    PT LONG TERM GOAL #1   Title Ind with advanced HEP.   Time 4   Period Weeks   Status On-going   PT LONG TERM GOAL #2   Title Active right shoulder flexion to 155 degrees so the patient can easily reach overhead   Time 4   Period Weeks   Status On-going  R shoulder 141 degrees PROM   PT LONG TERM GOAL #3   Title Active ER to 70 degrees+ to allow for easily donning/doffing of apparel   Time 4   Period Weeks   Status On-going  R shoulder 53 degrees PROM   PT LONG TERM GOAL #4   Title Increase ROM so patient is able to reach behind back to L3.   Time 4   Period Weeks   Status On-going   PT LONG TERM GOAL #5   Title Increase shoulder strength to a solid 4+/5 to increase stability for performance of functional activities   Time 4   Period Weeks   Status On-going   PT LONG TERM GOAL #6   Title Perform ADL's with pain not > 3/10   Time 4   Period Weeks   Status On-going  No pain associated with HEP just different sensations that before surgery.               Plan - 06/23/14 0959    Clinical Impression Statement Patient did well with treatment  today and was able to demonstrate the exercises [name] instructed him to begin.  Only had complaint of soreness during ER isometric exericse. Required demostration and verbal cueing for table stretch and technique of other exericses.    Pt will benefit from skilled therapeutic intervention in order to improve on the following deficits Decreased range of motion;Pain;Decreased activity tolerance   Rehab Potential Excellent   PT Frequency 2x / week   PT Duration 3 weeks   PT Treatment/Interventions --   PT Next Visit Plan Continue per PT POC. MD note said to begin isometrics only at this point, wait on IR another 2 weeks and to continue AAROM, pulleys.   Consulted and Agree with Plan of Care Patient        Problem List Patient Active Problem List   Diagnosis Date Noted  . Incisional hernia 08/11/2011  . Renal insufficiency 07/28/2011  . PROSTATE CANCER 10/05/2009  . Coronary atherosclerosis 10/05/2009  . JOINT EFFUSION, KNEE 06/15/2009  . ABNORMAL EKG 10/07/2008  . PROSTATE CANCER, HX OF 10/31/2007  . GOUT 05/16/2007  . ELEVATED PROSTATE SPECIFIC ANTIGEN 04/14/2007  . Essential hypertension 04/13/2007  . DIVERTICULOSIS, COLON 04/13/2007  . DEGENERATIVE JOINT DISEASE, MODERATE 04/13/2007  . Hyperlipidemia 09/26/2006  . ADVEF, DRUG/MED/BIOL SUBST, OTHER DRUG NOS 09/26/2006    Wynelle Fanny, PTA 06/23/2014, 10:57 AM  [facility] [address] [place], [place], [address] Phone: [phone]   Fax:  [phone]

## 2014-06-23 NOTE — Patient Instructions (Signed)
Strengthening: Isometric Flexion   Using wall for resistance, press right fist into ball using light pressure. Hold __5__ seconds. Repeat _10___ times per set. Do _2-3___ sets per session. Do __2__ sessions per day.  http://orth.exer.us/800   Copyright  VHI. All rights reserved.  Strengthening: Isometric Extension   Using wall for resistance, press back of left arm into ball using light pressure. Hold _5___ seconds. Repeat __10__ times per set. Do _2-3___ sets per session. Do _2___ sessions per day.  http://orth.exer.us/804   Copyright  VHI. All rights reserved.  Strengthening: Isometric Abduction   Using wall for resistance, press left arm into ball using light pressure. Hold __5__ seconds. Repeat _10___ times per set. Do _2-3___ sets per session. Do _2___ sessions per day.  http://orth.exer.us/806   Copyright  VHI. All rights reserved.  Strengthening: Isometric External Rotation   Using wall to provide resistance, and keeping right arm at side, press back of hand into ball using light pressure. Hold _5___ seconds. Repeat __10__ times per set. Do __2-3__ sets per session. Do __2__ sessions per day.  http://orth.exer.us/814   Copyright  VHI. All rights reserved.

## 2014-06-25 ENCOUNTER — Encounter: Payer: Self-pay | Admitting: *Deleted

## 2014-06-25 ENCOUNTER — Ambulatory Visit: Payer: Medicare Other | Admitting: *Deleted

## 2014-06-25 DIAGNOSIS — M25611 Stiffness of right shoulder, not elsewhere classified: Secondary | ICD-10-CM

## 2014-06-25 DIAGNOSIS — M25511 Pain in right shoulder: Secondary | ICD-10-CM

## 2014-06-25 NOTE — Therapy (Signed)
[facility] [place], [place], [address] Phone: [phone]   Fax:  [phone]  Physical Therapy Treatment  Patient Details  Name: [name] [name]. MRN: [id number] Date of Birth: 1946-03-06 Referring Provider:  [name], MD  Encounter Date: 06/25/2014      PT End of Session - 06/25/14 1140    Visit Number 13   Number of Visits 18   Date for PT Re-Evaluation 07/10/14   PT Start Time 1028   PT Stop Time 1131   PT Time Calculation (min) 63 min   Activity Tolerance Patient tolerated treatment well   Behavior During Therapy [facility] for tasks assessed/performed      Past Medical History  Diagnosis Date  . HYPERLIPIDEMIA 09/26/2006  . GOUT 05/16/2007  . HYPERTENSION 04/13/2007  . DIVERTICULOSIS, COLON 04/13/2007  . JOINT EFFUSION, KNEE 06/15/2009  . ELEVATED PROSTATE SPECIFIC ANTIGEN 04/14/2007  . ABNORMAL EKG 10/07/2008  . ADVEF, DRUG/MED/BIOL SUBST, OTHER DRUG NOS 09/26/2006  . Glaucoma     sees Eye Clinic at [facility]   . Diabetes mellitus     type 2, diet controlled   . Renal insufficiency     RESOLVED PER PATIENT  . DEGENERATIVE JOINT DISEASE, MODERATE 04/13/2007  . PROSTATE CANCER 10/05/2009    sees [name]  . Melanoma     sees [name]     Past Surgical History  Procedure Laterality Date  . Vasectomy    . Tonsillectomy    . Bunionectomy      bilateral  . Prostatectomy      per [name]  . Cystectomy      benign from hand  . Colonoscopy  2007    at [facility], clear, repeat in 10 yrs   . Melanoma excision  2012    per [name]  . Knee arthroscopy  Jan. 2013    right knee, per [name]   . Umbilical hernia repair  11/01/2011    Procedure: HERNIA REPAIR UMBILICAL ADULT;  Surgeon: Earnstine Regal, MD;  Location: [place];  Service: General;  Laterality: N/A;  Repair umbilical hernia with mesh patch  . Glaucoma surgery Bilateral 2014   . Ventral hernia repair  01/10/2013   with mesh     [name]  . Ventral hernia repair N/A 01/10/2013    Procedure: HERNIA REPAIR VENTRAL ADULT ;  Surgeon: Earnstine Regal, MD;  Location: [place];  Service: General;  Laterality: N/A;  . Insertion of mesh N/A 01/10/2013    Procedure: INSERTION OF MESH;  Surgeon: Earnstine Regal, MD;  Location: [place];  Service: General;  Laterality: N/A;    There were no vitals filed for this visit.  Visit Diagnosis:  Right shoulder pain  Shoulder stiffness, right      Subjective Assessment - 06/25/14 1120    Subjective Still sleeping in recliner. Faithfully doing suggested HEP several times a day. wearing sling most of time when out.   Limitations Lifting;House hold activities   Patient Stated Goals would like to be able to rest in bed and use arm again   Currently in Pain? Yes   Pain Score 2    Pain Location Shoulder   Pain Descriptors / Indicators Sore   Pain Frequency Intermittent   Pain Relieving Factors rest, light exercise   Effect of Pain on Daily Activities unable to do usual activities   Multiple Pain Sites No  [facility] Adult PT Treatment/Exercise - 06/25/14 0001    Exercises   Exercises --  Shoulder   Shoulder Exercises: Supine   Other Supine Exercises --  chest press AAROM 3 x 10   Other Supine Exercises --  cane shldr flex 3 x 10   Shoulder Exercises: Seated   Other Seated Exercises --  seated with ranger moved at various heights and locations    Other Seated Exercises --  x 18 min with seated ranger   Shoulder Exercises: Pulleys   Flexion --  3 min   Manual Therapy   Manual Therapy --  Passive and active passive ROM rt shldr..flex. ER                  PT Short Term Goals - 05/26/14 1030    PT SHORT TERM GOAL #1   Title STG's=LTG's.   Time 4   Period Weeks   Status On-going           PT Long Term Goals - 05/29/14 1024    PT LONG TERM GOAL #1   Title Ind with advanced HEP.   Time 4   Period Weeks    Status On-going   PT LONG TERM GOAL #2   Title Active right shoulder flexion to 155 degrees so the patient can easily reach overhead   Time 4   Period Weeks   Status On-going  R shoulder 141 degrees PROM   PT LONG TERM GOAL #3   Title Active ER to 70 degrees+ to allow for easily donning/doffing of apparel   Time 4   Period Weeks   Status On-going  R shoulder 53 degrees PROM   PT LONG TERM GOAL #4   Title Increase ROM so patient is able to reach behind back to L3.   Time 4   Period Weeks   Status On-going   PT LONG TERM GOAL #5   Title Increase shoulder strength to a solid 4+/5 to increase stability for performance of functional activities   Time 4   Period Weeks   Status On-going   PT LONG TERM GOAL #6   Title Perform ADL's with pain not > 3/10   Time 4   Period Weeks   Status On-going  No pain associated with HEP just different sensations that before surgery.               Plan - 06/25/14 1141    Clinical Impression Statement Doing HEP and has isometric exercises. Having mild pain only. wears sling when he goes out. Still painful unless he sleeps in recliner.   Pt will benefit from skilled therapeutic intervention in order to improve on the following deficits Decreased strength;Decreased mobility;Decreased range of motion   Rehab Potential Excellent   PT Frequency 2x / week   PT Duration 3 weeks   PT Next Visit Plan cont POC.    Consulted and Agree with Plan of Care Patient        Problem List Patient Active Problem List   Diagnosis Date Noted  . Incisional hernia 08/11/2011  . Renal insufficiency 07/28/2011  . PROSTATE CANCER 10/05/2009  . Coronary atherosclerosis 10/05/2009  . JOINT EFFUSION, KNEE 06/15/2009  . ABNORMAL EKG 10/07/2008  . PROSTATE CANCER, HX OF 10/31/2007  . GOUT 05/16/2007  . ELEVATED PROSTATE SPECIFIC ANTIGEN 04/14/2007  . Essential hypertension 04/13/2007  . DIVERTICULOSIS, COLON 04/13/2007  . DEGENERATIVE JOINT DISEASE, MODERATE  04/13/2007  . Hyperlipidemia 09/26/2006  . ADVEF, DRUG/MED/BIOL  SUBST, OTHER DRUG NOS 09/26/2006    Fabienne Bruns P,PTA 06/25/2014, 11:48 AM  [facility] [address] [place], [place], [address] Phone: [phone]   Fax:  [phone]

## 2014-06-26 ENCOUNTER — Encounter: Payer: PRIVATE HEALTH INSURANCE | Admitting: Physical Therapy

## 2014-06-26 DIAGNOSIS — H4011X1 Primary open-angle glaucoma, mild stage: Secondary | ICD-10-CM | POA: Diagnosis not present

## 2014-06-30 ENCOUNTER — Ambulatory Visit: Payer: Medicare Other | Admitting: Physical Therapy

## 2014-06-30 ENCOUNTER — Encounter: Payer: Self-pay | Admitting: Physical Therapy

## 2014-06-30 DIAGNOSIS — M25511 Pain in right shoulder: Secondary | ICD-10-CM

## 2014-06-30 DIAGNOSIS — M25611 Stiffness of right shoulder, not elsewhere classified: Secondary | ICD-10-CM | POA: Diagnosis not present

## 2014-06-30 NOTE — Therapy (Signed)
[facility] [place], [place], [address] Phone: [phone]   Fax:  [phone]  Physical Therapy Treatment  Patient Details  Name: [name] [name]. MRN: [id number] Date of Birth: Jul 26, 1945 Referring Provider:  [name], MD  Encounter Date: 06/30/2014      PT End of Session - 06/30/14 0958    Visit Number 14   Number of Visits 18   Date for PT Re-Evaluation 07/10/14   PT Start Time 0946   PT Stop Time 1031   PT Time Calculation (min) 45 min   Activity Tolerance Patient tolerated treatment well   Behavior During Therapy [facility] for tasks assessed/performed      Past Medical History  Diagnosis Date  . HYPERLIPIDEMIA 09/26/2006  . GOUT 05/16/2007  . HYPERTENSION 04/13/2007  . DIVERTICULOSIS, COLON 04/13/2007  . JOINT EFFUSION, KNEE 06/15/2009  . ELEVATED PROSTATE SPECIFIC ANTIGEN 04/14/2007  . ABNORMAL EKG 10/07/2008  . ADVEF, DRUG/MED/BIOL SUBST, OTHER DRUG NOS 09/26/2006  . Glaucoma     sees Eye Clinic at [facility]   . Diabetes mellitus     type 2, diet controlled   . Renal insufficiency     RESOLVED PER PATIENT  . DEGENERATIVE JOINT DISEASE, MODERATE 04/13/2007  . PROSTATE CANCER 10/05/2009    sees [name]  . Melanoma     sees [name]     Past Surgical History  Procedure Laterality Date  . Vasectomy    . Tonsillectomy    . Bunionectomy      bilateral  . Prostatectomy      per [name]  . Cystectomy      benign from hand  . Colonoscopy  2007    at [facility], clear, repeat in 10 yrs   . Melanoma excision  2012    per [name]  . Knee arthroscopy  Jan. 2013    right knee, per [name]   . Umbilical hernia repair  11/01/2011    Procedure: HERNIA REPAIR UMBILICAL ADULT;  Surgeon: Earnstine Regal, MD;  Location: [place];  Service: General;  Laterality: N/A;  Repair umbilical hernia with mesh patch  . Glaucoma surgery Bilateral 2014   . Ventral hernia repair  01/10/2013   with mesh     [name]  . Ventral hernia repair N/A 01/10/2013    Procedure: HERNIA REPAIR VENTRAL ADULT ;  Surgeon: Earnstine Regal, MD;  Location: [place];  Service: General;  Laterality: N/A;  . Insertion of mesh N/A 01/10/2013    Procedure: INSERTION OF MESH;  Surgeon: Earnstine Regal, MD;  Location: [place];  Service: General;  Laterality: N/A;    There were no vitals filed for this visit.  Visit Diagnosis:  Right shoulder pain  Shoulder stiffness, right      Subjective Assessment - 06/30/14 0947    Subjective Stated he has not used sling for 2 days and has been a little sore. He slept in his bed for the first time last night.   Limitations Lifting;House hold activities   Patient Stated Goals would like to be able to rest in bed and use arm again   Currently in Pain? Yes   Pain Score 2    Pain Location Shoulder   Pain Orientation Right   Pain Descriptors / Indicators Sore   Pain Type Surgical pain   Pain Onset More than a month ago   Pain Frequency Constant  [facility] PT Assessment - 06/30/14 0001    Assessment   Medical Diagnosis S/p right RTC surgery.   Onset Date 05/05/14   Next MD Visit 07/12/2014                     [facility] Adult PT Treatment/Exercise - 06/30/14 0001    Shoulder Exercises: Supine   Flexion AAROM;Right;Other (comment)  x30 reps   Other Supine Exercises Supine chest press AAROM x 30 reps   Shoulder Exercises: Seated   External Rotation AROM;Right;Other (comment)  x30 reps   Other Seated Exercises ABCs x1 rep   Shoulder Exercises: Pulleys   Flexion 3 minutes   Other Pulley Exercises UE ranger flex/ circles x30    Shoulder Exercises: ROM/Strengthening   Proximal Shoulder Strengthening, Supine flex/ext 10 x30 sec, ER/IR 10 x 30 sec   Manual Therapy   Manual Therapy Passive ROM   Passive ROM R shoulder into flex/scap/ER/IR with gentle holds at end range                  PT Short Term Goals - 06/30/14 0959    PT SHORT  TERM GOAL #1   Title STG's=LTG's.   Time 4   Period Weeks   Status On-going           PT Long Term Goals - 06/30/14 0959    PT LONG TERM GOAL #1   Title Ind with advanced HEP.   Time 4   Period Weeks   Status On-going   PT LONG TERM GOAL #2   Title Active right shoulder flexion to 155 degrees so the patient can easily reach overhead   Time 4   Period Weeks   Status On-going   PT LONG TERM GOAL #3   Title Active ER to 70 degrees+ to allow for easily donning/doffing of apparel   Time 4   Period Weeks   Status On-going   PT LONG TERM GOAL #4   Title Increase ROM so patient is able to reach behind back to L3.   Time 4   Period Weeks   Status On-going   PT LONG TERM GOAL #5   Title Increase shoulder strength to a solid 4+/5 to increase stability for performance of functional activities   Time 4   Period Weeks   Status On-going   PT LONG TERM GOAL #6   Title Perform ADL's with pain not > 3/10   Time 4   Period Weeks   Status On-going               Plan - 06/30/14 1032    Clinical Impression Statement Patient continues to tolerated treatment well without complaint of increased pain. Firm end feels noted during PROM. Stability of R shoulder improving overall.   Pt will benefit from skilled therapeutic intervention in order to improve on the following deficits Decreased strength;Decreased mobility;Decreased range of motion   Rehab Potential Excellent   PT Frequency 2x / week   PT Duration 3 weeks   PT Next Visit Plan Continue per PT POC and RCR protocol per [name].   Consulted and Agree with Plan of Care Patient        Problem List Patient Active Problem List   Diagnosis Date Noted  . Incisional hernia 08/11/2011  . Renal insufficiency 07/28/2011  . PROSTATE CANCER 10/05/2009  . Coronary atherosclerosis 10/05/2009  . JOINT EFFUSION, KNEE 06/15/2009  . ABNORMAL EKG 10/07/2008  . PROSTATE CANCER, HX  OF 10/31/2007  . GOUT 05/16/2007  . ELEVATED  PROSTATE SPECIFIC ANTIGEN 04/14/2007  . Essential hypertension 04/13/2007  . DIVERTICULOSIS, COLON 04/13/2007  . DEGENERATIVE JOINT DISEASE, MODERATE 04/13/2007  . Hyperlipidemia 09/26/2006  . ADVEF, DRUG/MED/BIOL SUBST, OTHER DRUG NOS 09/26/2006    Wynelle Fanny, PTA 06/30/2014, 10:35 AM  [facility] [address] [place], [place], [address] Phone: [phone]   Fax:  [phone]

## 2014-07-03 ENCOUNTER — Encounter: Payer: Self-pay | Admitting: Physical Therapy

## 2014-07-03 ENCOUNTER — Ambulatory Visit: Payer: Medicare Other | Admitting: Physical Therapy

## 2014-07-03 DIAGNOSIS — M25611 Stiffness of right shoulder, not elsewhere classified: Secondary | ICD-10-CM | POA: Diagnosis not present

## 2014-07-03 DIAGNOSIS — M25511 Pain in right shoulder: Secondary | ICD-10-CM | POA: Diagnosis not present

## 2014-07-03 NOTE — Therapy (Signed)
[facility] [place], [place], [address] Phone: [phone]   Fax:  [phone]  Physical Therapy Treatment  Patient Details  Name: [name] [name]. MRN: [id number] Date of Birth: Jul 21, 1945 Referring Provider:  [name], MD  Encounter Date: 07/03/2014      PT End of Session - 07/03/14 0949    Visit Number 15   Number of Visits 18   Date for PT Re-Evaluation 07/10/14   PT Start Time 0945   PT Stop Time 1030   PT Time Calculation (min) 45 min   Activity Tolerance Patient tolerated treatment well   Behavior During Therapy [facility] for tasks assessed/performed      Past Medical History  Diagnosis Date  . HYPERLIPIDEMIA 09/26/2006  . GOUT 05/16/2007  . HYPERTENSION 04/13/2007  . DIVERTICULOSIS, COLON 04/13/2007  . JOINT EFFUSION, KNEE 06/15/2009  . ELEVATED PROSTATE SPECIFIC ANTIGEN 04/14/2007  . ABNORMAL EKG 10/07/2008  . ADVEF, DRUG/MED/BIOL SUBST, OTHER DRUG NOS 09/26/2006  . Glaucoma     sees [facility] Clinic at [facility]   . Diabetes mellitus     type 2, diet controlled   . Renal insufficiency     RESOLVED PER PATIENT  . DEGENERATIVE JOINT DISEASE, MODERATE 04/13/2007  . PROSTATE CANCER 10/05/2009    sees [name]  . Melanoma     sees [name]     Past Surgical History  Procedure Laterality Date  . Vasectomy    . Tonsillectomy    . Bunionectomy      bilateral  . Prostatectomy      per [name]  . Cystectomy      benign from hand  . Colonoscopy  2007    at [facility], clear, repeat in 10 yrs   . Melanoma excision  2012    per [name]  . Knee arthroscopy  Jan. 2013    right knee, per [name]   . Umbilical hernia repair  11/01/2011    Procedure: HERNIA REPAIR UMBILICAL ADULT;  Surgeon: Earnstine Regal, MD;  Location: [place];  Service: General;  Laterality: N/A;  Repair umbilical hernia with mesh patch  . Glaucoma surgery Bilateral 2014   . Ventral hernia repair  01/10/2013   with mesh     [name]  . Ventral hernia repair N/A 01/10/2013    Procedure: HERNIA REPAIR VENTRAL ADULT ;  Surgeon: Earnstine Regal, MD;  Location: [place];  Service: General;  Laterality: N/A;  . Insertion of mesh N/A 01/10/2013    Procedure: INSERTION OF MESH;  Surgeon: Earnstine Regal, MD;  Location: [place];  Service: General;  Laterality: N/A;    There were no vitals filed for this visit.  Visit Diagnosis:  Right shoulder pain  Shoulder stiffness, right      Subjective Assessment - 07/03/14 0947    Subjective States that he has not used sling since Monday 06/30/2014 and has not been able to sleep good since Tuesday with increased pain. Has some swelling in his hand. Has been using his R arm to open the refrigerator and small activities but gently. States that the pain is worse than before his surgery.    Limitations Lifting;House hold activities   Patient Stated Goals would like to be able to rest in bed and use arm again   Currently in Pain? Yes   Pain Score 3    Pain Location Shoulder   Pain Orientation  Right   Pain Descriptors / Indicators Sore   Pain Type Surgical pain   Pain Onset More than a month ago   Pain Frequency Constant            [facility] PT Assessment - 07/03/14 0001    Assessment   Medical Diagnosis S/p right RTC surgery.   Onset Date 05/05/14   Next MD Visit 07/12/2014   ROM / Strength   AROM / PROM / Strength AROM   AROM   Overall AROM  Deficits   Overall AROM Comments Scaption 180   AROM Assessment Site Shoulder   Right/Left Shoulder Right   Right Shoulder Flexion 134 Degrees   Right Shoulder Internal Rotation 66 Degrees   Right Shoulder External Rotation 35 Degrees                     [facility] Adult PT Treatment/Exercise - 07/03/14 0001    Shoulder Exercises: Supine   Flexion AAROM;Right;Other (comment)  x30 reps   Other Supine Exercises Supine chest press AAROM x 30 reps   Shoulder Exercises: Seated   External Rotation AROM;Right;Other  (comment)  x30 reps   Shoulder Exercises: Pulleys   Flexion 3 minutes   Other Pulley Exercises UE ranger flex/ circles x30    Shoulder Exercises: ROM/Strengthening   Proximal Shoulder Strengthening, Supine Flex/ext, ER/IR x10 min   Manual Therapy   Manual Therapy Passive ROM   Passive ROM R shoulder into flex/scap/ER/IR with gentle holds at end range                PT Education - 07/03/14 0953    Education provided Yes   Education Details Educated by MPT to use sling to rest for a few hours if needed.   Person(s) Educated Patient   Methods Explanation   Comprehension Verbalized understanding          PT Short Term Goals - 07/03/14 1039    PT SHORT TERM GOAL #1   Title STG's=LTG's.   Time 4   Period Weeks   Status On-going           PT Long Term Goals - 07/03/14 1040    PT LONG TERM GOAL #1   Title Ind with advanced HEP.   Time 4   Period Weeks   Status On-going   PT LONG TERM GOAL #2   Title Active right shoulder flexion to 155 degrees so the patient can easily reach overhead   Time 4   Period Weeks   Status On-going   PT LONG TERM GOAL #3   Title Active ER to 70 degrees+ to allow for easily donning/doffing of apparel   Time 4   Period Weeks   Status On-going   PT LONG TERM GOAL #4   Title Increase ROM so patient is able to reach behind back to L3.   Time 4   Period Weeks   Status On-going   PT LONG TERM GOAL #5   Title Increase shoulder strength to a solid 4+/5 to increase stability for performance of functional activities   Time 4   Period Weeks   Status On-going   PT LONG TERM GOAL #6   Title Perform ADL's with pain not > 3/10   Time 4   Period Weeks   Status On-going               Plan - 07/03/14 1037    Clinical Impression Statement Patient continues to tolerate treatment  well without complaints of increased pain. Firm end feels noted during PROM. AROM measurements are advancing to goal status. All LT goals remain on-going due  to pain, weakness and ROM limitations.  Experienced 1/10 R shoulder pain following treatment.   Pt will benefit from skilled therapeutic intervention in order to improve on the following deficits Decreased strength;Decreased mobility;Decreased range of motion   Rehab Potential Excellent   PT Frequency 2x / week   PT Duration 3 weeks   PT Next Visit Plan Continue per PT POC and RCR protocol per [name]. Sees [name] 07/12/2014.   Consulted and Agree with Plan of Care Patient        Problem List Patient Active Problem List   Diagnosis Date Noted  . Incisional hernia 08/11/2011  . Renal insufficiency 07/28/2011  . PROSTATE CANCER 10/05/2009  . Coronary atherosclerosis 10/05/2009  . JOINT EFFUSION, KNEE 06/15/2009  . ABNORMAL EKG 10/07/2008  . PROSTATE CANCER, HX OF 10/31/2007  . GOUT 05/16/2007  . ELEVATED PROSTATE SPECIFIC ANTIGEN 04/14/2007  . Essential hypertension 04/13/2007  . DIVERTICULOSIS, COLON 04/13/2007  . DEGENERATIVE JOINT DISEASE, MODERATE 04/13/2007  . Hyperlipidemia 09/26/2006  . ADVEF, DRUG/MED/BIOL SUBST, OTHER DRUG NOS 09/26/2006    Wynelle Fanny, PTA 07/03/2014, 10:44 AM  [facility] [address] [place], [place], [address] Phone: [phone]   Fax:  [phone]

## 2014-07-07 ENCOUNTER — Ambulatory Visit: Payer: Medicare Other | Attending: Orthopedic Surgery | Admitting: Physical Therapy

## 2014-07-07 DIAGNOSIS — M25511 Pain in right shoulder: Secondary | ICD-10-CM | POA: Insufficient documentation

## 2014-07-07 DIAGNOSIS — M25611 Stiffness of right shoulder, not elsewhere classified: Secondary | ICD-10-CM | POA: Diagnosis not present

## 2014-07-07 NOTE — Therapy (Signed)
[facility] [place], [place], [address] Phone: [phone]   Fax:  [phone]  Physical Therapy Treatment  Patient Details  Name: [name] [name]. MRN: [id number] Date of Birth: 08/04/1945 Referring Provider:  [name], MD  Encounter Date: 07/07/2014      PT End of Session - 07/07/14 1615    Visit Number 16   Number of Visits 18   Date for PT Re-Evaluation 07/10/14   PT Start Time 0309   PT Stop Time 0411   PT Time Calculation (min) 62 min   Activity Tolerance Patient tolerated treatment well      Past Medical History  Diagnosis Date  . HYPERLIPIDEMIA 09/26/2006  . GOUT 05/16/2007  . HYPERTENSION 04/13/2007  . DIVERTICULOSIS, COLON 04/13/2007  . JOINT EFFUSION, KNEE 06/15/2009  . ELEVATED PROSTATE SPECIFIC ANTIGEN 04/14/2007  . ABNORMAL EKG 10/07/2008  . ADVEF, DRUG/MED/BIOL SUBST, OTHER DRUG NOS 09/26/2006  . Glaucoma     sees [facility] Clinic at [facility]   . Diabetes mellitus     type 2, diet controlled   . Renal insufficiency     RESOLVED PER PATIENT  . DEGENERATIVE JOINT DISEASE, MODERATE 04/13/2007  . PROSTATE CANCER 10/05/2009    sees [name]  . Melanoma     sees [name]     Past Surgical History  Procedure Laterality Date  . Vasectomy    . Tonsillectomy    . Bunionectomy      bilateral  . Prostatectomy      per [name]  . Cystectomy      benign from hand  . Colonoscopy  2007    at [facility], clear, repeat in 10 yrs   . Melanoma excision  2012    per [name]  . Knee arthroscopy  Jan. 2013    right knee, per [name]   . Umbilical hernia repair  11/01/2011    Procedure: HERNIA REPAIR UMBILICAL ADULT;  Surgeon: Earnstine Regal, MD;  Location: [place];  Service: General;  Laterality: N/A;  Repair umbilical hernia with mesh patch  . Glaucoma surgery Bilateral 2014   . Ventral hernia repair  01/10/2013    with mesh     [name]  . Ventral hernia repair N/A  01/10/2013    Procedure: HERNIA REPAIR VENTRAL ADULT ;  Surgeon: Earnstine Regal, MD;  Location: [place];  Service: General;  Laterality: N/A;  . Insertion of mesh N/A 01/10/2013    Procedure: INSERTION OF MESH;  Surgeon: Earnstine Regal, MD;  Location: [place];  Service: General;  Laterality: N/A;    There were no vitals filed for this visit.  Visit Diagnosis:  Right shoulder pain  Shoulder stiffness, right      Subjective Assessment - 07/07/14 1522    Subjective The patient states he cannot get comfortable at night and his right hand has been swelling.  His resting pain-level upon presentation to the clinic today is a 3/10 but higher at night.  Patient states his shoulder began hurting more after discontiinuing sling usage.  He states his shoulder has been "popping" as well.                         [facility] Adult PT Treatment/Exercise - 07/07/14 0001    Modalities   Modalities Electrical Stimulation;Ultrasound   Acupuncturist Location --  Right  shoulder.   Electrical Stimulation Action 80-150 Hz x 20 minutes.   Ultrasound   Ultrasound Location --  Right shoulder.   Ultrasound Parameters 1.50 W/CM2 x 10 minutes.   Manual Therapy   Manual Therapy --  Passive-assistive left shld ROM x 13 minutes.                  PT Short Term Goals - 07/03/14 1039    PT SHORT TERM GOAL #1   Title STG's=LTG's.   Time 4   Period Weeks   Status On-going           PT Long Term Goals - 07/03/14 1040    PT LONG TERM GOAL #1   Title Ind with advanced HEP.   Time 4   Period Weeks   Status On-going   PT LONG TERM GOAL #2   Title Active right shoulder flexion to 155 degrees so the patient can easily reach overhead   Time 4   Period Weeks   Status On-going   PT LONG TERM GOAL #3   Title Active ER to 70 degrees+ to allow for easily donning/doffing of apparel   Time 4   Period Weeks   Status On-going   PT LONG TERM GOAL #4   Title  Increase ROM so patient is able to reach behind back to L3.   Time 4   Period Weeks   Status On-going   PT LONG TERM GOAL #5   Title Increase shoulder strength to a solid 4+/5 to increase stability for performance of functional activities   Time 4   Period Weeks   Status On-going   PT LONG TERM GOAL #6   Title Perform ADL's with pain not > 3/10   Time 4   Period Weeks   Status On-going               Plan - 07/07/14 1616    Pt will benefit from skilled therapeutic intervention in order to improve on the following deficits Decreased strength;Decreased mobility;Decreased range of motion   Rehab Potential Excellent   PT Frequency 2x / week   PT Duration 3 weeks   PT Next Visit Plan Continue per PT POC and RCR protocol per [name]. Sees [name] 07/12/2014.   Consulted and Agree with Plan of Care Patient        Problem List Patient Active Problem List   Diagnosis Date Noted  . Incisional hernia 08/11/2011  . Renal insufficiency 07/28/2011  . PROSTATE CANCER 10/05/2009  . Coronary atherosclerosis 10/05/2009  . JOINT EFFUSION, KNEE 06/15/2009  . ABNORMAL EKG 10/07/2008  . PROSTATE CANCER, HX OF 10/31/2007  . GOUT 05/16/2007  . ELEVATED PROSTATE SPECIFIC ANTIGEN 04/14/2007  . Essential hypertension 04/13/2007  . DIVERTICULOSIS, COLON 04/13/2007  . DEGENERATIVE JOINT DISEASE, MODERATE 04/13/2007  . Hyperlipidemia 09/26/2006  . ADVEF, DRUG/MED/BIOL SUBST, OTHER DRUG NOS 09/26/2006    Brooklen Runquist, [place] MPT 07/07/2014, 4:19 PM  [facility] [address] [place], [place], [address] Phone: [phone]   Fax:  [phone]

## 2014-07-10 ENCOUNTER — Encounter: Payer: Self-pay | Admitting: Physical Therapy

## 2014-07-10 ENCOUNTER — Ambulatory Visit: Payer: Medicare Other | Admitting: Physical Therapy

## 2014-07-10 DIAGNOSIS — M25511 Pain in right shoulder: Secondary | ICD-10-CM | POA: Diagnosis not present

## 2014-07-10 DIAGNOSIS — S43431D Superior glenoid labrum lesion of right shoulder, subsequent encounter: Secondary | ICD-10-CM | POA: Diagnosis not present

## 2014-07-10 DIAGNOSIS — M25611 Stiffness of right shoulder, not elsewhere classified: Secondary | ICD-10-CM

## 2014-07-10 DIAGNOSIS — Z4789 Encounter for other orthopedic aftercare: Secondary | ICD-10-CM | POA: Diagnosis not present

## 2014-07-10 NOTE — Therapy (Signed)
[facility] [place], [place], [address] Phone: [phone]   Fax:  [phone]  Physical Therapy Treatment  Patient Details  Name: [name] [name]. MRN: [id number] Date of Birth: 1945/04/09 Referring Provider:  [name], MD  Encounter Date: 07/10/2014      PT End of Session - 07/10/14 0955    Visit Number 17   Number of Visits 18   Date for PT Re-Evaluation 07/10/14   PT Start Time 0946   PT Stop Time 1033   PT Time Calculation (min) 47 min   Activity Tolerance Patient tolerated treatment well   Behavior During Therapy [facility] for tasks assessed/performed      Past Medical History  Diagnosis Date  . HYPERLIPIDEMIA 09/26/2006  . GOUT 05/16/2007  . HYPERTENSION 04/13/2007  . DIVERTICULOSIS, COLON 04/13/2007  . JOINT EFFUSION, KNEE 06/15/2009  . ELEVATED PROSTATE SPECIFIC ANTIGEN 04/14/2007  . ABNORMAL EKG 10/07/2008  . ADVEF, DRUG/MED/BIOL SUBST, OTHER DRUG NOS 09/26/2006  . Glaucoma     sees Eye Clinic at [facility]   . Diabetes mellitus     type 2, diet controlled   . Renal insufficiency     RESOLVED PER PATIENT  . DEGENERATIVE JOINT DISEASE, MODERATE 04/13/2007  . PROSTATE CANCER 10/05/2009    sees [name]  . Melanoma     sees [name]     Past Surgical History  Procedure Laterality Date  . Vasectomy    . Tonsillectomy    . Bunionectomy      bilateral  . Prostatectomy      per [name]  . Cystectomy      benign from hand  . Colonoscopy  2007    at [facility], clear, repeat in 10 yrs   . Melanoma excision  2012    per [name]  . Knee arthroscopy  Jan. 2013    right knee, per [name]   . Umbilical hernia repair  11/01/2011    Procedure: HERNIA REPAIR UMBILICAL ADULT;  Surgeon: Earnstine Regal, MD;  Location: [place];  Service: General;  Laterality: N/A;  Repair umbilical hernia with mesh patch  . Glaucoma surgery Bilateral 2014   . Ventral hernia repair  01/10/2013   with mesh     [name]  . Ventral hernia repair N/A 01/10/2013    Procedure: HERNIA REPAIR VENTRAL ADULT ;  Surgeon: Earnstine Regal, MD;  Location: [place];  Service: General;  Laterality: N/A;  . Insertion of mesh N/A 01/10/2013    Procedure: INSERTION OF MESH;  Surgeon: Earnstine Regal, MD;  Location: [place];  Service: General;  Laterality: N/A;    There were no vitals filed for this visit.  Visit Diagnosis:  Right shoulder pain  Shoulder stiffness, right      Subjective Assessment - 07/10/14 0950    Subjective States that his shoulder is still sore and still demonstrates swelling in R hand. Sees [name] this afternoon at 2pm. Pain usually constant in the morning as lessens as the day. States that his shoulder pops almost every repitition of AAROM exericses with cane. States that the pain at night is worse than before the surgery and keeps him awake even with a pillow underneath his R arm.    Limitations Lifting;House hold activities   Patient Stated Goals would like to be able to rest in bed and use arm again   Currently in Pain? Yes  Pain Score 2    Pain Location Shoulder   Pain Orientation Right   Pain Descriptors / Indicators Sore   Pain Type Surgical pain   Pain Onset More than a month ago   Pain Frequency Constant            [facility] PT Assessment - 07/10/14 0001    Assessment   Medical Diagnosis S/p right RTC surgery.   Onset Date 05/05/14   Next MD Visit 07/10/2014   AROM   Overall AROM  Deficits   Overall AROM Comments Scaption 145   AROM Assessment Site Shoulder   Right/Left Shoulder Right   Right Shoulder Flexion 152 Degrees   Right Shoulder Internal Rotation 42 Degrees   Right Shoulder External Rotation 90 Degrees                     [facility] Adult PT Treatment/Exercise - 07/10/14 0001    Shoulder Exercises: Supine   Flexion AAROM;Right;Other (comment)  x30 reps   Other Supine Exercises Supine chest press AAROM x 30 reps   Shoulder Exercises:  Seated   External Rotation AROM;Right;Other (comment)  x30 reps   Shoulder Exercises: Pulleys   Flexion 3 minutes   Other Pulley Exercises UE ranger flex/ circles x30    Manual Therapy   Manual Therapy Passive ROM   Passive ROM R shoulder into flex/scap/ER/IR with gentle holds at end range                  PT Short Term Goals - 07/10/14 1002    PT SHORT TERM GOAL #1   Title STG's=LTG's.   Time 4   Period Weeks   Status On-going           PT Long Term Goals - 07/10/14 1002    PT LONG TERM GOAL #1   Title Ind with advanced HEP.   Time 4   Period Weeks   Status On-going   PT LONG TERM GOAL #2   Title Active right shoulder flexion to 155 degrees so the patient can easily reach overhead   Time 4   Period Weeks   Status On-going   PT LONG TERM GOAL #3   Title Active ER to 70 degrees+ to allow for easily donning/doffing of apparel   Time 4   Period Weeks   Status Achieved   PT LONG TERM GOAL #4   Title Increase ROM so patient is able to reach behind back to L3.   Time 4   Period Weeks   Status On-going   PT LONG TERM GOAL #5   Title Increase shoulder strength to a solid 4+/5 to increase stability for performance of functional activities   Time 4   Period Weeks   Status On-going   PT LONG TERM GOAL #6   Title Perform ADL's with pain not > 3/10   Time 4   Period Weeks   Status Achieved               Plan - 07/10/14 1037    Clinical Impression Statement Patient continues to tolerate treatment well without complaint of pain. Firm end feels noted during PROM. Achieved LT goal #3 for ER >70 deg actively and LT goal #6 of completing ADLs with pain less than 3/10. All remaining goals considered on-going due to lack of ROM and strength but nearing goal status. Denied pain only some stiffness following treatment.   Pt will benefit from skilled therapeutic intervention in  order to improve on the following deficits Decreased strength;Decreased  mobility;Decreased range of motion   Rehab Potential Excellent   PT Frequency 2x / week   PT Duration 3 weeks   PT Next Visit Plan Continue per PT POC and RCR protocol per [name]. Sees [name] 07/12/2014.   Consulted and Agree with Plan of Care Patient        Problem List Patient Active Problem List   Diagnosis Date Noted  . Incisional hernia 08/11/2011  . Renal insufficiency 07/28/2011  . PROSTATE CANCER 10/05/2009  . Coronary atherosclerosis 10/05/2009  . JOINT EFFUSION, KNEE 06/15/2009  . ABNORMAL EKG 10/07/2008  . PROSTATE CANCER, HX OF 10/31/2007  . GOUT 05/16/2007  . ELEVATED PROSTATE SPECIFIC ANTIGEN 04/14/2007  . Essential hypertension 04/13/2007  . DIVERTICULOSIS, COLON 04/13/2007  . DEGENERATIVE JOINT DISEASE, MODERATE 04/13/2007  . Hyperlipidemia 09/26/2006  . ADVEF, DRUG/MED/BIOL SUBST, OTHER DRUG NOS 09/26/2006    Wynelle Fanny, PTA 07/10/2014, 10:41 AM  [facility] [address] [place], [place], [address] Phone: [phone]   Fax:  [phone]

## 2014-07-14 ENCOUNTER — Ambulatory Visit: Payer: Medicare Other | Admitting: Physical Therapy

## 2014-07-14 ENCOUNTER — Encounter: Payer: Self-pay | Admitting: Physical Therapy

## 2014-07-14 DIAGNOSIS — M25611 Stiffness of right shoulder, not elsewhere classified: Secondary | ICD-10-CM

## 2014-07-14 DIAGNOSIS — M25511 Pain in right shoulder: Secondary | ICD-10-CM

## 2014-07-14 NOTE — Therapy (Signed)
[facility] [place], [place], [address] Phone: [phone]   Fax:  [phone]  Physical Therapy Treatment  Patient Details  Name: [name] [name]. MRN: [id number] Date of Birth: 1945/05/21 Referring Provider:  [name], MD  Encounter Date: 07/14/2014      PT End of Session - 07/14/14 0956    Visit Number 18   Number of Visits 26   Date for PT Re-Evaluation 08/04/14   PT Start Time 0948   PT Stop Time 1032   PT Time Calculation (min) 44 min   Activity Tolerance Patient tolerated treatment well   Behavior During Therapy [facility] for tasks assessed/performed      Past Medical History  Diagnosis Date  . HYPERLIPIDEMIA 09/26/2006  . GOUT 05/16/2007  . HYPERTENSION 04/13/2007  . DIVERTICULOSIS, COLON 04/13/2007  . JOINT EFFUSION, KNEE 06/15/2009  . ELEVATED PROSTATE SPECIFIC ANTIGEN 04/14/2007  . ABNORMAL EKG 10/07/2008  . ADVEF, DRUG/MED/BIOL SUBST, OTHER DRUG NOS 09/26/2006  . Glaucoma     sees Eye Clinic at [facility]   . Diabetes mellitus     type 2, diet controlled   . Renal insufficiency     RESOLVED PER PATIENT  . DEGENERATIVE JOINT DISEASE, MODERATE 04/13/2007  . PROSTATE CANCER 10/05/2009    sees [name]  . Melanoma     sees [name]     Past Surgical History  Procedure Laterality Date  . Vasectomy    . Tonsillectomy    . Bunionectomy      bilateral  . Prostatectomy      per [name]  . Cystectomy      benign from hand  . Colonoscopy  2007    at [facility], clear, repeat in 10 yrs   . Melanoma excision  2012    per [name]  . Knee arthroscopy  Jan. 2013    right knee, per [name]   . Umbilical hernia repair  11/01/2011    Procedure: HERNIA REPAIR UMBILICAL ADULT;  Surgeon: Earnstine Regal, MD;  Location: [place];  Service: General;  Laterality: N/A;  Repair umbilical hernia with mesh patch  . Glaucoma surgery Bilateral 2014   . Ventral hernia repair  01/10/2013   with mesh     [name]  . Ventral hernia repair N/A 01/10/2013    Procedure: HERNIA REPAIR VENTRAL ADULT ;  Surgeon: Earnstine Regal, MD;  Location: [place];  Service: General;  Laterality: N/A;  . Insertion of mesh N/A 01/10/2013    Procedure: INSERTION OF MESH;  Surgeon: Earnstine Regal, MD;  Location: [place];  Service: General;  Laterality: N/A;    There were no vitals filed for this visit.  Visit Diagnosis:  Right shoulder pain  Shoulder stiffness, right      Subjective Assessment - 07/14/14 0953    Subjective States that [name] was pleased with progress, cautioned him regarding shoulder elevation with exercises. States that pain was from nerves and has begun vtiamins as prescribed by [name]. Can begin fiddle playing and gentle swimming as of 08/06/2014 per [name]. States that he has been able to sleep better after starting vitamins for nerve pain.    Limitations Lifting;House hold activities   Patient Stated Goals would like to be able to rest in bed and use arm again   Currently in Pain? Yes   Pain Score 2    Pain Location Shoulder  Pain Orientation Right            [facility] PT Assessment - 07/14/14 0001    Assessment   Medical Diagnosis S/p right RTC surgery.   Onset Date 05/05/14   Next MD Visit 08/12/2014                     [facility] Adult PT Treatment/Exercise - 07/14/14 0001    Shoulder Exercises: Supine   Flexion AROM;20 reps;Right  x20 reps supine, x20 reps with wedge   Other Supine Exercises Scaption AROM x20 reps supine, x20 reps with wedge   Shoulder Exercises: Standing   Protraction Strengthening;Right;Theraband;Other (comment)  x25 reps   Theraband Level (Shoulder Protraction) Level 1 (Yellow)   External Rotation Strengthening;Right;Theraband;Other (comment)  x25 reps   Theraband Level (Shoulder External Rotation) Level 1 (Yellow)   Internal Rotation Strengthening;Right;Theraband;Other (comment)  x25 reps   Theraband Level (Shoulder Internal  Rotation) Level 1 (Yellow)   Extension AROM;Right;20 reps   Row Strengthening;Right;20 reps;Theraband   Theraband Level (Shoulder Row) Level 1 (Yellow)   Shoulder Exercises: Pulleys   Flexion Other (comment)  x45min   Other Pulley Exercises UE ranger flex/ circles x30    Modalities   Modalities Cryotherapy   Cryotherapy   Number Minutes Cryotherapy 15 Minutes   Cryotherapy Location Shoulder   Type of Cryotherapy Other (comment)  Vasopneumatic                  PT Short Term Goals - 07/14/14 1019    PT SHORT TERM GOAL #1   Title STG's=LTG's.   Time 4   Period Weeks   Status On-going           PT Long Term Goals - 07/14/14 1019    PT LONG TERM GOAL #1   Title Ind with advanced HEP.   Time 4   Period Weeks   Status On-going   PT LONG TERM GOAL #2   Title Active right shoulder flexion to 155 degrees so the patient can easily reach overhead   Time 4   Period Weeks   Status On-going   PT LONG TERM GOAL #3   Title Active ER to 70 degrees+ to allow for easily donning/doffing of apparel   Time 4   Period Weeks   Status Achieved   PT LONG TERM GOAL #4   Title Increase ROM so patient is able to reach behind back to L3.   Time 4   Period Weeks   Status On-going   PT LONG TERM GOAL #5   Title Increase shoulder strength to a solid 4+/5 to increase stability for performance of functional activities   Time 4   Period Weeks   Status On-going   PT LONG TERM GOAL #6   Title Perform ADL's with pain not > 3/10   Time 4   Period Weeks   Status Achieved               Plan - 07/14/14 1020    Clinical Impression Statement Patient continues to tolerate treatment well without complaint of increased pain. Band strengthening initiated per [name] and was tolerated well. Lawnchair progression was initated for R shoulder flexion and scaption to strengthen and progress ROM while eliminating R shoulder elevation compensation. All remaining goals considered on-going  at this time due to decreased ROM and strength. Vasopneumatic system was applied to R shoulder following exercises to decrease inflammation from intiation of new exercises. Denied pain following  treatment.   Pt will benefit from skilled therapeutic intervention in order to improve on the following deficits Decreased strength;Decreased mobility;Decreased range of motion   Rehab Potential Excellent   PT Frequency 2x / week   PT Duration 3 weeks   PT Next Visit Plan Continue per PT POC and RCR protocol per [name]. Continue strengthening of R shoulder.   Consulted and Agree with Plan of Care Patient        Problem List Patient Active Problem List   Diagnosis Date Noted  . Incisional hernia 08/11/2011  . Renal insufficiency 07/28/2011  . PROSTATE CANCER 10/05/2009  . Coronary atherosclerosis 10/05/2009  . JOINT EFFUSION, KNEE 06/15/2009  . ABNORMAL EKG 10/07/2008  . PROSTATE CANCER, HX OF 10/31/2007  . GOUT 05/16/2007  . ELEVATED PROSTATE SPECIFIC ANTIGEN 04/14/2007  . Essential hypertension 04/13/2007  . DIVERTICULOSIS, COLON 04/13/2007  . DEGENERATIVE JOINT DISEASE, MODERATE 04/13/2007  . Hyperlipidemia 09/26/2006  . ADVEF, DRUG/MED/BIOL SUBST, OTHER DRUG NOS 09/26/2006    Wynelle Fanny, PTA 07/14/2014, 10:36 AM  [facility] [address] [place], [place], [address] Phone: [phone]   Fax:  [phone]

## 2014-07-17 ENCOUNTER — Ambulatory Visit: Payer: Medicare Other | Admitting: *Deleted

## 2014-07-17 ENCOUNTER — Encounter: Payer: Self-pay | Admitting: *Deleted

## 2014-07-17 DIAGNOSIS — M25512 Pain in left shoulder: Secondary | ICD-10-CM

## 2014-07-17 DIAGNOSIS — M25611 Stiffness of right shoulder, not elsewhere classified: Secondary | ICD-10-CM | POA: Diagnosis not present

## 2014-07-17 DIAGNOSIS — M25511 Pain in right shoulder: Secondary | ICD-10-CM

## 2014-07-17 DIAGNOSIS — M25612 Stiffness of left shoulder, not elsewhere classified: Secondary | ICD-10-CM

## 2014-07-17 NOTE — Therapy (Signed)
[facility] [place], [place], [address] Phone: [phone]   Fax:  [phone]  Physical Therapy Treatment  Patient Details  Name: [name] [name]. MRN: [id number] Date of Birth: 04-21-1945 Referring Provider:  [name], MD  Encounter Date: 07/17/2014      PT End of Session - 07/17/14 1038    Visit Number 19   Number of Visits 26   Date for PT Re-Evaluation 08/04/14   PT Start Time 0946   PT Stop Time 1042   PT Time Calculation (min) 56 min      Past Medical History  Diagnosis Date  . HYPERLIPIDEMIA 09/26/2006  . GOUT 05/16/2007  . HYPERTENSION 04/13/2007  . DIVERTICULOSIS, COLON 04/13/2007  . JOINT EFFUSION, KNEE 06/15/2009  . ELEVATED PROSTATE SPECIFIC ANTIGEN 04/14/2007  . ABNORMAL EKG 10/07/2008  . ADVEF, DRUG/MED/BIOL SUBST, OTHER DRUG NOS 09/26/2006  . Glaucoma     sees [facility] Clinic at [facility]   . Diabetes mellitus     type 2, diet controlled   . Renal insufficiency     RESOLVED PER PATIENT  . DEGENERATIVE JOINT DISEASE, MODERATE 04/13/2007  . PROSTATE CANCER 10/05/2009    sees [name]  . Melanoma     sees [name]     Past Surgical History  Procedure Laterality Date  . Vasectomy    . Tonsillectomy    . Bunionectomy      bilateral  . Prostatectomy      per [name]  . Cystectomy      benign from hand  . Colonoscopy  2007    at [facility], clear, repeat in 10 yrs   . Melanoma excision  2012    per [name]  . Knee arthroscopy  Jan. 2013    right knee, per [name]   . Umbilical hernia repair  11/01/2011    Procedure: HERNIA REPAIR UMBILICAL ADULT;  Surgeon: Earnstine Regal, MD;  Location: [place];  Service: General;  Laterality: N/A;  Repair umbilical hernia with mesh patch  . Glaucoma surgery Bilateral 2014   . Ventral hernia repair  01/10/2013    with mesh     [name]  . Ventral hernia repair N/A 01/10/2013    Procedure: HERNIA REPAIR VENTRAL ADULT ;   Surgeon: Earnstine Regal, MD;  Location: [place];  Service: General;  Laterality: N/A;  . Insertion of mesh N/A 01/10/2013    Procedure: INSERTION OF MESH;  Surgeon: Earnstine Regal, MD;  Location: [place];  Service: General;  Laterality: N/A;    There were no vitals filed for this visit.  Visit Diagnosis:  Right shoulder pain  Shoulder stiffness, right  Left shoulder pain  Shoulder stiffness, left      Subjective Assessment - 07/17/14 1004    Subjective States that [name] was pleased with progress, cautioned him regarding shoulder elevation with exercises. States that pain was from nerves and has begun vtiamins as prescribed by [name]. Can begin fiddle playing and gentle swimming as of 08/06/2014 per [name]. States that he has been able to sleep better after starting vitamins for nerve pain.    Limitations Lifting;House hold activities   Patient Stated Goals would like to be able to rest in bed and use arm again   Pain Score 2    Pain Location Shoulder   Pain Orientation Right   Pain Descriptors / Indicators Sore  Pain Type Surgical pain   Pain Onset More than a month ago   Pain Frequency Intermittent   Aggravating Factors  ADLs   Pain Relieving Factors exs, ice,rest            [facility] PT Assessment - 07/17/14 0001    AROM   Right/Left Shoulder Right   Right Shoulder Flexion 160 Degrees   Right Shoulder External Rotation 65 Degrees                     [facility] Adult PT Treatment/Exercise - 07/17/14 0001    Exercises   Exercises Shoulder   Shoulder Exercises: Sidelying   External Rotation AROM;Right  3x10   Flexion AROM  3x10   Shoulder Exercises: Standing   Other Standing Exercises RW4 yellow tubing 2x10 each   Other Standing Exercises wall push ups   Shoulder Exercises: Pulleys   Flexion 3 minutes   Other Pulley Exercises UE ranger flex/ circles x30    Cryotherapy   Number Minutes Cryotherapy 15 Minutes   Cryotherapy Location Shoulder   Type of  Cryotherapy --  vasopnuematic in sitting   Manual Therapy   Manual Therapy Passive ROM   Passive ROM R shoulder into flex/scap/ER/IR with gentle holds at end range                  PT Short Term Goals - 07/14/14 1019    PT SHORT TERM GOAL #1   Title STG's=LTG's.   Time 4   Period Weeks   Status On-going           PT Long Term Goals - 07/14/14 1019    PT LONG TERM GOAL #1   Title Ind with advanced HEP.   Time 4   Period Weeks   Status On-going   PT LONG TERM GOAL #2   Title Active right shoulder flexion to 155 degrees so the patient can easily reach overhead   Time 4   Period Weeks   Status On-going   PT LONG TERM GOAL #3   Title Active ER to 70 degrees+ to allow for easily donning/doffing of apparel   Time 4   Period Weeks   Status Achieved   PT LONG TERM GOAL #4   Title Increase ROM so patient is able to reach behind back to L3.   Time 4   Period Weeks   Status On-going   PT LONG TERM GOAL #5   Title Increase shoulder strength to a solid 4+/5 to increase stability for performance of functional activities   Time 4   Period Weeks   Status On-going   PT LONG TERM GOAL #6   Title Perform ADL's with pain not > 3/10   Time 4   Period Weeks   Status Achieved               Plan - 07/17/14 1039    Clinical Impression Statement Pt did well with Rx today and was able to perform supine and sidelying elevation to facilitate standing elevation. Current Un-met goals are on-going   Pt will benefit from skilled therapeutic intervention in order to improve on the following deficits Decreased strength;Decreased mobility;Decreased range of motion   Rehab Potential Excellent   PT Frequency 2x / week   PT Duration 3 weeks   PT Next Visit Plan Continue per PT POC and RCR protocol per [name]. Continue strengthening of R shoulder.   Consulted and Agree with Plan of Care  Patient        Problem List Patient Active Problem List   Diagnosis Date Noted  .  Incisional hernia 08/11/2011  . Renal insufficiency 07/28/2011  . PROSTATE CANCER 10/05/2009  . Coronary atherosclerosis 10/05/2009  . JOINT EFFUSION, KNEE 06/15/2009  . ABNORMAL EKG 10/07/2008  . PROSTATE CANCER, HX OF 10/31/2007  . GOUT 05/16/2007  . ELEVATED PROSTATE SPECIFIC ANTIGEN 04/14/2007  . Essential hypertension 04/13/2007  . DIVERTICULOSIS, COLON 04/13/2007  . DEGENERATIVE JOINT DISEASE, MODERATE 04/13/2007  . Hyperlipidemia 09/26/2006  . ADVEF, DRUG/MED/BIOL SUBST, OTHER DRUG NOS 09/26/2006    RAMSEUR,CHRIS, PTA 07/17/2014, 10:46 AM  [facility] [address] [place], [place], [address] Phone: [phone]   Fax:  [phone]

## 2014-07-21 ENCOUNTER — Ambulatory Visit: Payer: Medicare Other | Admitting: Physical Therapy

## 2014-07-21 DIAGNOSIS — M25611 Stiffness of right shoulder, not elsewhere classified: Secondary | ICD-10-CM

## 2014-07-21 DIAGNOSIS — M25511 Pain in right shoulder: Secondary | ICD-10-CM | POA: Diagnosis not present

## 2014-07-21 NOTE — Therapy (Signed)
[facility] [place], [place], [address] Phone: [phone]   Fax:  [phone]  Physical Therapy Treatment  Patient Details  Name: [name] [name]. MRN: [id number] Date of Birth: Feb 19, 1946 Referring Provider:  [name], MD  Encounter Date: 07/21/2014      PT End of Session - 07/21/14 1526    Visit Number 20   Date for PT Re-Evaluation 08/04/14   PT Start Time 0315   PT Stop Time 0404   PT Time Calculation (min) 49 min      Past Medical History  Diagnosis Date  . HYPERLIPIDEMIA 09/26/2006  . GOUT 05/16/2007  . HYPERTENSION 04/13/2007  . DIVERTICULOSIS, COLON 04/13/2007  . JOINT EFFUSION, KNEE 06/15/2009  . ELEVATED PROSTATE SPECIFIC ANTIGEN 04/14/2007  . ABNORMAL EKG 10/07/2008  . ADVEF, DRUG/MED/BIOL SUBST, OTHER DRUG NOS 09/26/2006  . Glaucoma     sees [facility] Clinic at [facility]   . Diabetes mellitus     type 2, diet controlled   . Renal insufficiency     RESOLVED PER PATIENT  . DEGENERATIVE JOINT DISEASE, MODERATE 04/13/2007  . PROSTATE CANCER 10/05/2009    sees [name]  . Melanoma     sees [name]     Past Surgical History  Procedure Laterality Date  . Vasectomy    . Tonsillectomy    . Bunionectomy      bilateral  . Prostatectomy      per [name]  . Cystectomy      benign from hand  . Colonoscopy  2007    at [facility], clear, repeat in 10 yrs   . Melanoma excision  2012    per [name]  . Knee arthroscopy  Jan. 2013    right knee, per [name]   . Umbilical hernia repair  11/01/2011    Procedure: HERNIA REPAIR UMBILICAL ADULT;  Surgeon: Earnstine Regal, MD;  Location: [place];  Service: General;  Laterality: N/A;  Repair umbilical hernia with mesh patch  . Glaucoma surgery Bilateral 2014   . Ventral hernia repair  01/10/2013    with mesh     [name]  . Ventral hernia repair N/A 01/10/2013    Procedure: HERNIA REPAIR VENTRAL ADULT ;  Surgeon: Earnstine Regal, MD;   Location: [place];  Service: General;  Laterality: N/A;  . Insertion of mesh N/A 01/10/2013    Procedure: INSERTION OF MESH;  Surgeon: Earnstine Regal, MD;  Location: [place];  Service: General;  Laterality: N/A;    There were no vitals filed for this visit.  Visit Diagnosis:  Right shoulder pain  Shoulder stiffness, right      Subjective Assessment - 07/21/14 1600    Subjective 3/10 pain-level today but I've been doing a lot more driving.  Patient is worried that he may have re-injured his shoulder.  He remembers shortly after his surgery he was lying down and his cell phone rang and he iinstinctively rapidly jerked his left shoulder.   Limitations Lifting;House hold activities   Patient Stated Goals would like to be able to rest in bed and use arm again                                   PT Short Term Goals - 07/14/14 1019    PT SHORT TERM GOAL #1  Title STG's=LTG's.   Time 4   Period Weeks   Status On-going           PT Long Term Goals - 07/14/14 1019    PT LONG TERM GOAL #1   Title Ind with advanced HEP.   Time 4   Period Weeks   Status On-going   PT LONG TERM GOAL #2   Title Active right shoulder flexion to 155 degrees so the patient can easily reach overhead   Time 4   Period Weeks   Status On-going   PT LONG TERM GOAL #3   Title Active ER to 70 degrees+ to allow for easily donning/doffing of apparel   Time 4   Period Weeks   Status Achieved   PT LONG TERM GOAL #4   Title Increase ROM so patient is able to reach behind back to L3.   Time 4   Period Weeks   Status On-going   PT LONG TERM GOAL #5   Title Increase shoulder strength to a solid 4+/5 to increase stability for performance of functional activities   Time 4   Period Weeks   Status On-going   PT LONG TERM GOAL #6   Title Perform ADL's with pain not > 3/10   Time 4   Period Weeks   Status Achieved     Treatment:  Pulleys x 5 minutes; UE ranger on wall x 5 minutes and  wall ladder x 5 minutes.  Combo U/S-E'Stim x 12 minutes to patient affected right shoulder f/b STW/M x 64minutes  Shoulder felt very good after treatment.              G-Codes - 07-30-2014 1606    Functional Assessment Tool Used FOTO.   Functional Limitation Mobility: Walking and moving around   Mobility: Walking and Moving Around Current Status [phone]) At least 40 percent but less than 60 percent impaired, limited or restricted   Mobility: Walking and Moving Around Goal Status [phone]) At least 40 percent but less than 60 percent impaired, limited or restricted      Problem List Patient Active Problem List   Diagnosis Date Noted  . Incisional hernia 08/11/2011  . Renal insufficiency 07/28/2011  . PROSTATE CANCER 10/05/2009  . Coronary atherosclerosis 10/05/2009  . JOINT EFFUSION, KNEE 06/15/2009  . ABNORMAL EKG 10/07/2008  . PROSTATE CANCER, HX OF 10/31/2007  . GOUT 05/16/2007  . ELEVATED PROSTATE SPECIFIC ANTIGEN 04/14/2007  . Essential hypertension 04/13/2007  . DIVERTICULOSIS, COLON 04/13/2007  . DEGENERATIVE JOINT DISEASE, MODERATE 04/13/2007  . Hyperlipidemia 09/26/2006  . ADVEF, DRUG/MED/BIOL SUBST, OTHER DRUG NOS 09/26/2006    Harnoor Reta, [place] MPT 2014-07-30, 4:12 PM  [facility] [address] [place], [place], [address] Phone: [phone]   Fax:  [phone]

## 2014-07-24 ENCOUNTER — Encounter: Payer: Self-pay | Admitting: Physical Therapy

## 2014-07-24 ENCOUNTER — Ambulatory Visit: Payer: Medicare Other | Admitting: Physical Therapy

## 2014-07-24 DIAGNOSIS — M25611 Stiffness of right shoulder, not elsewhere classified: Secondary | ICD-10-CM | POA: Diagnosis not present

## 2014-07-24 DIAGNOSIS — M25612 Stiffness of left shoulder, not elsewhere classified: Secondary | ICD-10-CM

## 2014-07-24 DIAGNOSIS — M25511 Pain in right shoulder: Secondary | ICD-10-CM

## 2014-07-24 DIAGNOSIS — M25512 Pain in left shoulder: Secondary | ICD-10-CM

## 2014-07-24 NOTE — Therapy (Signed)
[facility] [place], [place], [address] Phone: [phone]   Fax:  [phone]  Physical Therapy Treatment  Patient Details  Name: [name] [name]. MRN: [id number] Date of Birth: [birth year] Referring Provider:  [name], MD  Encounter Date: 07/24/2014      PT End of Session - 07/24/14 1032    Visit Number 21   Number of Visits 26   Date for PT Re-Evaluation 08/04/14   PT Start Time 0945   PT Stop Time 0240   PT Time Calculation (min) 59 min   Activity Tolerance Patient tolerated treatment well   Behavior During Therapy [facility] for tasks assessed/performed      Past Medical History  Diagnosis Date  . HYPERLIPIDEMIA 09/26/2006  . GOUT 05/16/2007  . HYPERTENSION 04/13/2007  . DIVERTICULOSIS, COLON 04/13/2007  . JOINT EFFUSION, KNEE 06/15/2009  . ELEVATED PROSTATE SPECIFIC ANTIGEN 04/14/2007  . ABNORMAL EKG 10/07/2008  . ADVEF, DRUG/MED/BIOL SUBST, OTHER DRUG NOS 09/26/2006  . Glaucoma     sees Eye Clinic at [facility]   . Diabetes mellitus     type 2, diet controlled   . Renal insufficiency     RESOLVED PER PATIENT  . DEGENERATIVE JOINT DISEASE, MODERATE 04/13/2007  . PROSTATE CANCER 10/05/2009    sees [name]  . Melanoma     sees [name]     Past Surgical History  Procedure Laterality Date  . Vasectomy    . Tonsillectomy    . Bunionectomy      bilateral  . Prostatectomy      per [name]  . Cystectomy      benign from hand  . Colonoscopy  2007    at [facility], clear, repeat in 10 yrs   . Melanoma excision  2012    per [name]  . Knee arthroscopy  Jan. 2013    right knee, per [name]   . Umbilical hernia repair  11/01/2011    Procedure: HERNIA REPAIR UMBILICAL ADULT;  Surgeon: Earnstine Regal, MD;  Location: [place];  Service: General;  Laterality: N/A;  Repair umbilical hernia with mesh patch  . Glaucoma surgery Bilateral 2014   . Ventral hernia repair  01/10/2013   with mesh     [name]  . Ventral hernia repair N/A 01/10/2013    Procedure: HERNIA REPAIR VENTRAL ADULT ;  Surgeon: Earnstine Regal, MD;  Location: [place];  Service: General;  Laterality: N/A;  . Insertion of mesh N/A 01/10/2013    Procedure: INSERTION OF MESH;  Surgeon: Earnstine Regal, MD;  Location: [place];  Service: General;  Laterality: N/A;    There were no vitals filed for this visit.  Visit Diagnosis:  Right shoulder pain  Shoulder stiffness, right  Left shoulder pain  Shoulder stiffness, left      Subjective Assessment - 07/24/14 0951    Subjective 1-2/10 pain upon arrival today, soreness when tring to sleep   Limitations Lifting;House hold activities   Patient Stated Goals would like to be able to rest in bed and use arm again   Currently in Pain? Yes   Pain Score 2    Pain Location Shoulder   Pain Orientation Right   Pain Descriptors / Indicators Sore   Pain Type Surgical pain   Pain Onset More than a month ago   Pain Frequency Intermittent   Aggravating Factors  trying to sleep or  increased movement   Pain Relieving Factors rest and ice            [facility] PT Assessment - 07/24/14 0001    PROM   Overall PROM  Deficits   PROM Assessment Site Shoulder   Right/Left Shoulder Right   Right Shoulder Flexion 153 Degrees   Right Shoulder External Rotation 67 Degrees                     [facility] Adult PT Treatment/Exercise - 07/24/14 0001    Shoulder Exercises: Prone   Other Prone Exercises kneeling ext/horiz abd (half range)/ scaption(half range) 3x10 each   Shoulder Exercises: Standing   Other Standing Exercises RW4 with yellow t-band 2x10 each   Shoulder Exercises: Pulleys   Flexion --  71min   Other Pulley Exercises UE ranger for elevation and circles 2x10 each way   Manual Therapy   Manual Therapy Passive ROM   Passive ROM pasive assistive ROM for flexion/ER                   PT Short Term Goals - 07/14/14 1019    PT SHORT TERM GOAL #1    Title STG's=LTG's.   Time 4   Period Weeks   Status On-going           PT Long Term Goals - 07/14/14 1019    PT LONG TERM GOAL #1   Title Ind with advanced HEP.   Time 4   Period Weeks   Status On-going   PT LONG TERM GOAL #2   Title Active right shoulder flexion to 155 degrees so the patient can easily reach overhead   Time 4   Period Weeks   Status On-going   PT LONG TERM GOAL #3   Title Active ER to 70 degrees+ to allow for easily donning/doffing of apparel   Time 4   Period Weeks   Status Achieved   PT LONG TERM GOAL #4   Title Increase ROM so patient is able to reach behind back to L3.   Time 4   Period Weeks   Status On-going   PT LONG TERM GOAL #5   Title Increase shoulder strength to a solid 4+/5 to increase stability for performance of functional activities   Time 4   Period Weeks   Status On-going   PT LONG TERM GOAL #6   Title Perform ADL's with pain not > 3/10   Time 4   Period Weeks   Status Achieved               Plan - 07/24/14 1033    Clinical Impression Statement patient continues to progress with all activities. has little pain/soreness and has improved P/AAROM.progressing with strengthening and has good technique. goals ongoing.   Pt will benefit from skilled therapeutic intervention in order to improve on the following deficits Decreased strength;Decreased mobility;Decreased range of motion   Rehab Potential Excellent   Clinical Impairments Affecting Rehab Potential surgery 05/05/14 ( current 11 weeks 07/21/14)   PT Frequency 2x / week   PT Duration 3 weeks   PT Next Visit Plan Continue per PT POC and RCR protocol per [name]. Continue strengthening of R shoulder. (MD june 7)   Consulted and Agree with Plan of Care Patient        Problem List Patient Active Problem List   Diagnosis Date Noted  . Incisional hernia 08/11/2011  . Renal insufficiency 07/28/2011  . PROSTATE  CANCER 10/05/2009  . Coronary atherosclerosis  10/05/2009  . JOINT EFFUSION, KNEE 06/15/2009  . ABNORMAL EKG 10/07/2008  . PROSTATE CANCER, HX OF 10/31/2007  . GOUT 05/16/2007  . ELEVATED PROSTATE SPECIFIC ANTIGEN 04/14/2007  . Essential hypertension 04/13/2007  . DIVERTICULOSIS, COLON 04/13/2007  . DEGENERATIVE JOINT DISEASE, MODERATE 04/13/2007  . Hyperlipidemia 09/26/2006  . ADVEF, DRUG/MED/BIOL SUBST, OTHER DRUG NOS 09/26/2006    Hanley Rispoli P, PTA 07/24/2014, 10:44 AM  [facility] [address] [place], [place], [address] Phone: [phone]   Fax:  [phone]

## 2014-07-28 ENCOUNTER — Encounter: Payer: Self-pay | Admitting: Physical Therapy

## 2014-07-28 ENCOUNTER — Ambulatory Visit: Payer: Medicare Other | Admitting: Physical Therapy

## 2014-07-28 DIAGNOSIS — M25611 Stiffness of right shoulder, not elsewhere classified: Secondary | ICD-10-CM

## 2014-07-28 DIAGNOSIS — M25511 Pain in right shoulder: Secondary | ICD-10-CM | POA: Diagnosis not present

## 2014-07-28 NOTE — Therapy (Signed)
[facility] [place], [place], [address] Phone: [phone]   Fax:  [phone]  Physical Therapy Treatment  Patient Details  Name: [name] [name]. MRN: [id number] Date of Birth: 1945/08/09 Referring Provider:  [name], MD  Encounter Date: 07/28/2014      PT End of Session - 07/28/14 0959    Visit Number 22   Number of Visits 26   Date for PT Re-Evaluation 08/04/14   PT Start Time 0947   PT Stop Time 1032   PT Time Calculation (min) 45 min   Activity Tolerance Patient tolerated treatment well   Behavior During Therapy [facility] for tasks assessed/performed      Past Medical History  Diagnosis Date  . HYPERLIPIDEMIA 09/26/2006  . GOUT 05/16/2007  . HYPERTENSION 04/13/2007  . DIVERTICULOSIS, COLON 04/13/2007  . JOINT EFFUSION, KNEE 06/15/2009  . ELEVATED PROSTATE SPECIFIC ANTIGEN 04/14/2007  . ABNORMAL EKG 10/07/2008  . ADVEF, DRUG/MED/BIOL SUBST, OTHER DRUG NOS 09/26/2006  . Glaucoma     sees Eye Clinic at [facility]   . Diabetes mellitus     type 2, diet controlled   . Renal insufficiency     RESOLVED PER PATIENT  . DEGENERATIVE JOINT DISEASE, MODERATE 04/13/2007  . PROSTATE CANCER 10/05/2009    sees [name]  . Melanoma     sees [name]     Past Surgical History  Procedure Laterality Date  . Vasectomy    . Tonsillectomy    . Bunionectomy      bilateral  . Prostatectomy      per [name]  . Cystectomy      benign from hand  . Colonoscopy  2007    at [facility], clear, repeat in 10 yrs   . Melanoma excision  2012    per [name]  . Knee arthroscopy  Jan. 2013    right knee, per [name]   . Umbilical hernia repair  11/01/2011    Procedure: HERNIA REPAIR UMBILICAL ADULT;  Surgeon: Earnstine Regal, MD;  Location: [place];  Service: General;  Laterality: N/A;  Repair umbilical hernia with mesh patch  . Glaucoma surgery Bilateral 2014   . Ventral hernia repair  01/10/2013   with mesh     [name]  . Ventral hernia repair N/A 01/10/2013    Procedure: HERNIA REPAIR VENTRAL ADULT ;  Surgeon: Earnstine Regal, MD;  Location: [place];  Service: General;  Laterality: N/A;  . Insertion of mesh N/A 01/10/2013    Procedure: INSERTION OF MESH;  Surgeon: Earnstine Regal, MD;  Location: [place];  Service: General;  Laterality: N/A;    There were no vitals filed for this visit.  Visit Diagnosis:  Right shoulder pain  Shoulder stiffness, right      Subjective Assessment - 07/28/14 0958    Subjective States that he only has the normal exercise soreness today.   Limitations Lifting;House hold activities   Patient Stated Goals would like to be able to rest in bed and use arm again   Currently in Pain? Yes   Pain Score 2    Pain Location Shoulder   Pain Orientation Right   Pain Descriptors / Indicators Sore   Pain Type Surgical pain   Pain Onset More than a month ago   Pain Frequency Intermittent   Aggravating Factors  Sleep, increased movment   Pain Relieving Factors Rest, ice  [facility] Adult PT Treatment/Exercise - 07/28/14 0001    Shoulder Exercises: Supine   Other Supine Exercises Small circles x30 reps   Shoulder Exercises: Sidelying   External Rotation AROM;Right  x30 reps   Flexion AROM  x30 reps   Shoulder Exercises: Standing   Protraction Strengthening;Right;Theraband;Other (comment)  x30 reps   Theraband Level (Shoulder Protraction) Level 1 (Yellow)   External Rotation Strengthening;Right;Theraband;Other (comment)  x30 reps   Theraband Level (Shoulder External Rotation) Level 1 (Yellow)   Internal Rotation Strengthening;Right;Theraband;Other (comment)  x30 reps   Theraband Level (Shoulder Internal Rotation) Level 1 (Yellow)   Extension Strengthening;Right;Weights  x30 reps   Extension Weight (lbs) 1   Row Strengthening;Right;Theraband  x30 reps   Theraband Level (Shoulder Row) Level 1 (Yellow)   Row Weight (lbs)  1   Shoulder Exercises: Pulleys   Flexion 3 minutes   Other Pulley Exercises UE ranger for elevation and circles 3x10 each way   Shoulder Exercises: ROM/Strengthening   Wall Wash x30 reps   Proximal Shoulder Strengthening, Supine Flex/ext, ER/IR x48min   Manual Therapy   Manual Therapy Passive ROM   Passive ROM PROM/AAROM R shoulder into flex/scap/ER/IR with gentle holds at end range                PT Education - 07/28/14 1003    Education provided Yes   Education Details HEP- RW4 yellow theraband given   Person(s) Educated Patient   Methods Explanation;Demonstration;Verbal cues;Handout   Comprehension Verbalized understanding;Returned demonstration;Verbal cues required          PT Short Term Goals - 07/14/14 1019    PT SHORT TERM GOAL #1   Title STG's=LTG's.   Time 4   Period Weeks   Status On-going           PT Long Term Goals - 07/14/14 1019    PT LONG TERM GOAL #1   Title Ind with advanced HEP.   Time 4   Period Weeks   Status On-going   PT LONG TERM GOAL #2   Title Active right shoulder flexion to 155 degrees so the patient can easily reach overhead   Time 4   Period Weeks   Status On-going   PT LONG TERM GOAL #3   Title Active ER to 70 degrees+ to allow for easily donning/doffing of apparel   Time 4   Period Weeks   Status Achieved   PT LONG TERM GOAL #4   Title Increase ROM so patient is able to reach behind back to L3.   Time 4   Period Weeks   Status On-going   PT LONG TERM GOAL #5   Title Increase shoulder strength to a solid 4+/5 to increase stability for performance of functional activities   Time 4   Period Weeks   Status On-going   PT LONG TERM GOAL #6   Title Perform ADL's with pain not > 3/10   Time 4   Period Weeks   Status Achieved               Plan - 07/28/14 1037    Clinical Impression Statement Patient continues to progress and tolerate treatment well without complaint of increased pain. Good R shoulder  stability noted during stabilization exercises. Firm end feels noted in PROM of R shoulder. Accepted and welcomed new strengthening HEP. Denied pain or soreness following treatment.   Pt will benefit from skilled therapeutic intervention in order to improve on the following deficits Decreased strength;Decreased  mobility;Decreased range of motion   Rehab Potential Excellent   Clinical Impairments Affecting Rehab Potential surgery 05/05/14 ( current 11 weeks 07/21/14)   PT Frequency 2x / week   PT Duration 3 weeks   PT Next Visit Plan Continue per PT POC and RCR protocol per [name]. Continue strengthening of R shoulder. (MD june 7)   Consulted and Agree with Plan of Care Patient        Problem List Patient Active Problem List   Diagnosis Date Noted  . Incisional hernia 08/11/2011  . Renal insufficiency 07/28/2011  . PROSTATE CANCER 10/05/2009  . Coronary atherosclerosis 10/05/2009  . JOINT EFFUSION, KNEE 06/15/2009  . ABNORMAL EKG 10/07/2008  . PROSTATE CANCER, HX OF 10/31/2007  . GOUT 05/16/2007  . ELEVATED PROSTATE SPECIFIC ANTIGEN 04/14/2007  . Essential hypertension 04/13/2007  . DIVERTICULOSIS, COLON 04/13/2007  . DEGENERATIVE JOINT DISEASE, MODERATE 04/13/2007  . Hyperlipidemia 09/26/2006  . ADVEF, DRUG/MED/BIOL SUBST, OTHER DRUG NOS 09/26/2006    Wynelle Fanny, PTA 07/28/2014, 10:40 AM  [facility] [address] [place], [place], [address] Phone: [phone]   Fax:  [phone]

## 2014-07-28 NOTE — Patient Instructions (Signed)
Strengthening: Resisted Flexion   Hold tubing with left arm at side. Pull forward and up. Move shoulder through pain-free range of motion. (PUNCHES) Repeat _10___ times per set. Do _2-3___ sets per session. Do __2__ sessions per day.  http://orth.exer.us/824   Copyright  VHI. All rights reserved.  Strengthening: Resisted External Rotation   Hold tubing in right hand, elbow at side and forearm across body. Rotate forearm out. Repeat _10___ times per set. Do _2-3___ sets per session. Do __2__ sessions per day.  http://orth.exer.us/828   Copyright  VHI. All rights reserved.  Strengthening: Resisted Internal Rotation   Hold tubing in left hand, elbow at side and forearm out. Rotate forearm in across body. Repeat __10__ times per set. Do _2-3___ sets per session. Do __2__ sessions per day.  http://orth.exer.us/830   Copyright  VHI. All rights reserved.  Strengthening: Resisted Extension   Hold tubing in right hand, arm forward. Pull arm back, elbow straight. (CRANK THE LAWNMOWER WITH ELBOW FLEXED AND EXTENDED) Repeat __10__ times per set. Do __2-3__ sets per session. Do _2___ sessions per day.  http://orth.exer.us/832   Copyright  VHI. All rights reserved.

## 2014-07-31 ENCOUNTER — Encounter: Payer: Self-pay | Admitting: Physical Therapy

## 2014-07-31 ENCOUNTER — Ambulatory Visit: Payer: Medicare Other | Admitting: Physical Therapy

## 2014-07-31 DIAGNOSIS — M25511 Pain in right shoulder: Secondary | ICD-10-CM

## 2014-07-31 DIAGNOSIS — M25611 Stiffness of right shoulder, not elsewhere classified: Secondary | ICD-10-CM

## 2014-07-31 DIAGNOSIS — Z85820 Personal history of malignant melanoma of skin: Secondary | ICD-10-CM | POA: Diagnosis not present

## 2014-07-31 DIAGNOSIS — Z08 Encounter for follow-up examination after completed treatment for malignant neoplasm: Secondary | ICD-10-CM | POA: Diagnosis not present

## 2014-07-31 DIAGNOSIS — Z1283 Encounter for screening for malignant neoplasm of skin: Secondary | ICD-10-CM | POA: Diagnosis not present

## 2014-07-31 DIAGNOSIS — L820 Inflamed seborrheic keratosis: Secondary | ICD-10-CM | POA: Diagnosis not present

## 2014-07-31 NOTE — Therapy (Signed)
[facility] [place], [place], [address] Phone: [phone]   Fax:  [phone]  Physical Therapy Treatment  Patient Details  Name: [name] [name]. MRN: [id number] Date of Birth: April 06, 1945 Referring Provider:  [name], MD  Encounter Date: 07/31/2014      PT End of Session - 07/31/14 1440    Visit Number 23   Number of Visits 26   Date for PT Re-Evaluation 08/04/14   PT Start Time 1432   PT Stop Time 1515   PT Time Calculation (min) 43 min   Activity Tolerance Patient tolerated treatment well   Behavior During Therapy [facility] for tasks assessed/performed      Past Medical History  Diagnosis Date  . HYPERLIPIDEMIA 09/26/2006  . GOUT 05/16/2007  . HYPERTENSION 04/13/2007  . DIVERTICULOSIS, COLON 04/13/2007  . JOINT EFFUSION, KNEE 06/15/2009  . ELEVATED PROSTATE SPECIFIC ANTIGEN 04/14/2007  . ABNORMAL EKG 10/07/2008  . ADVEF, DRUG/MED/BIOL SUBST, OTHER DRUG NOS 09/26/2006  . Glaucoma     sees Eye Clinic at [facility]   . Diabetes mellitus     type 2, diet controlled   . Renal insufficiency     RESOLVED PER PATIENT  . DEGENERATIVE JOINT DISEASE, MODERATE 04/13/2007  . PROSTATE CANCER 10/05/2009    sees [name]  . Melanoma     sees [name]     Past Surgical History  Procedure Laterality Date  . Vasectomy    . Tonsillectomy    . Bunionectomy      bilateral  . Prostatectomy      per [name]  . Cystectomy      benign from hand  . Colonoscopy  2007    at [facility], clear, repeat in 10 yrs   . Melanoma excision  2012    per [name]  . Knee arthroscopy  Jan. 2013    right knee, per [name]   . Umbilical hernia repair  11/01/2011    Procedure: HERNIA REPAIR UMBILICAL ADULT;  Surgeon: Earnstine Regal, MD;  Location: [place];  Service: General;  Laterality: N/A;  Repair umbilical hernia with mesh patch  . Glaucoma surgery Bilateral 2014   . Ventral hernia repair  01/10/2013   with mesh     [name]  . Ventral hernia repair N/A 01/10/2013    Procedure: HERNIA REPAIR VENTRAL ADULT ;  Surgeon: Earnstine Regal, MD;  Location: [place];  Service: General;  Laterality: N/A;  . Insertion of mesh N/A 01/10/2013    Procedure: INSERTION OF MESH;  Surgeon: Earnstine Regal, MD;  Location: [place];  Service: General;  Laterality: N/A;    There were no vitals filed for this visit.  Visit Diagnosis:  Right shoulder pain  Shoulder stiffness, right      Subjective Assessment - 07/31/14 1438    Subjective States he will be out of town next week. Only reports soreness but did not complete HEP this morning due to MD appointment.   Limitations Lifting;House hold activities   Patient Stated Goals would like to be able to rest in bed and use arm again   Currently in Pain? Yes   Pain Score 2    Pain Location Shoulder   Pain Orientation Right   Pain Descriptors / Indicators Sore   Pain Type Surgical pain            [facility] PT Assessment - 07/31/14 0001  Assessment   Medical Diagnosis S/p right RTC surgery.   Onset Date/Surgical Date 05/05/14   Next MD Visit 08/12/2014                     [facility] Adult PT Treatment/Exercise - 07/31/14 0001    Shoulder Exercises: Supine   Flexion AROM;Right  x30 reps; lawnchair progression with wedge   Other Supine Exercises Small circles x30 reps   Other Supine Exercises Lawnchair progression AROM R scaption x30 reps   Shoulder Exercises: Sidelying   External Rotation Strengthening;Right;Weights  x30 reps   External Rotation Weight (lbs) 1   Flexion --   Other Sidelying Exercises --   Shoulder Exercises: Standing   Protraction Strengthening;Right;Theraband;Other (comment)  x30 reps   Theraband Level (Shoulder Protraction) Level 1 (Yellow)   External Rotation Strengthening;Right;Theraband;Other (comment)  x30 reps   Theraband Level (Shoulder External Rotation) Level 1 (Yellow)   Internal Rotation  Strengthening;Right;Theraband;Other (comment)  x30 reps   Theraband Level (Shoulder Internal Rotation) Level 1 (Yellow)   Extension Strengthening;Right;Weights  x30 reps   Extension Weight (lbs) 2   Row Strengthening;Right;Theraband  x30 reps   Theraband Level (Shoulder Row) Level 1 (Yellow)   Row Weight (lbs) 1,2  1 set 10 reps with 1#, 2 sets 10 reps with 2#   Other Standing Exercises 4way stabilization up/down, side to side, CW, CCW circles x30 reps each   Shoulder Exercises: Pulleys   Flexion Other (comment)  x88min   Other Pulley Exercises UE ranger for elevation and circles 3x10 each way   Other Pulley Exercises Wall ladder x39min   Shoulder Exercises: ROM/Strengthening   Wall Pushups 20 reps   Ball on Wall x3 min   Manual Therapy   Manual Therapy Passive ROM   Passive ROM PROM/AAROM R shoulder into flex/scap/ER/IR with gentle holds at end range                  PT Short Term Goals - 07/14/14 1019    PT SHORT TERM GOAL #1   Title STG's=LTG's.   Time 4   Period Weeks   Status On-going           PT Long Term Goals - 07/31/14 1613    PT LONG TERM GOAL #1   Title Ind with advanced HEP.   Time 4   Period Weeks   Status Achieved   PT LONG TERM GOAL #2   Title Active right shoulder flexion to 155 degrees so the patient can easily reach overhead   Time 4   Period Weeks   Status On-going   PT LONG TERM GOAL #3   Title Active ER to 70 degrees+ to allow for easily donning/doffing of apparel   Time 4   Period Weeks   Status Achieved   PT LONG TERM GOAL #4   Title Increase ROM so patient is able to reach behind back to L3.   Time 4   Period Weeks   Status On-going   PT LONG TERM GOAL #5   Title Increase shoulder strength to a solid 4+/5 to increase stability for performance of functional activities   Time 4   Period Weeks   Status On-going   PT LONG TERM GOAL #6   Title Perform ADL's with pain not > 3/10   Time 4   Period Weeks   Status Achieved                Plan - 07/31/14 1647  Clinical Impression Statement Patient continues to progress in all regards of therapy and without complaint of pain. Demonstrated good R shoulder stability during stabilization exercises. Firm end feels noted in PROM of R shoulder. Educated to continue HEP while on vacation next week. Experienced 1/10 pain following treatment.   Pt will benefit from skilled therapeutic intervention in order to improve on the following deficits Decreased strength;Decreased mobility;Decreased range of motion   Rehab Potential Excellent   Clinical Impairments Affecting Rehab Potential surgery 05/05/14 ( current 11 weeks 07/21/14)   PT Frequency 2x / week   PT Duration 3 weeks   PT Next Visit Plan Continue per PT POC and RCR protocol per [name]. Continue strengthening of R shoulder. (MD june 7)   Consulted and Agree with Plan of Care Patient        Problem List Patient Active Problem List   Diagnosis Date Noted  . Incisional hernia 08/11/2011  . Renal insufficiency 07/28/2011  . PROSTATE CANCER 10/05/2009  . Coronary atherosclerosis 10/05/2009  . JOINT EFFUSION, KNEE 06/15/2009  . ABNORMAL EKG 10/07/2008  . PROSTATE CANCER, HX OF 10/31/2007  . GOUT 05/16/2007  . ELEVATED PROSTATE SPECIFIC ANTIGEN 04/14/2007  . Essential hypertension 04/13/2007  . DIVERTICULOSIS, COLON 04/13/2007  . DEGENERATIVE JOINT DISEASE, MODERATE 04/13/2007  . Hyperlipidemia 09/26/2006  . ADVEF, DRUG/MED/BIOL SUBST, OTHER DRUG NOS 09/26/2006    Wynelle Fanny, PTA 07/31/2014, 4:50 PM  [facility] [address] [place], [place], [address] Phone: [phone]   Fax:  [phone]

## 2014-08-11 ENCOUNTER — Ambulatory Visit: Payer: Medicare Other | Attending: Orthopedic Surgery | Admitting: Physical Therapy

## 2014-08-11 ENCOUNTER — Encounter: Payer: Self-pay | Admitting: Physical Therapy

## 2014-08-11 DIAGNOSIS — M25511 Pain in right shoulder: Secondary | ICD-10-CM | POA: Diagnosis not present

## 2014-08-11 DIAGNOSIS — M25611 Stiffness of right shoulder, not elsewhere classified: Secondary | ICD-10-CM | POA: Diagnosis not present

## 2014-08-11 NOTE — Therapy (Signed)
[facility] [place], [place], [address] Phone: [phone]   Fax:  [phone]  Physical Therapy Treatment  Patient Details  Name: [name] [name]. MRN: [id number] Date of Birth: 11-08-1945 Referring Provider:  [name], MD  Encounter Date: 08/11/2014      PT End of Session - 08/11/14 0938    Visit Number 24   Number of Visits 26   PT Start Time 0936   PT Stop Time 1015   PT Time Calculation (min) 39 min   Activity Tolerance Patient tolerated treatment well   Behavior During Therapy [facility] for tasks assessed/performed      Past Medical History  Diagnosis Date  . HYPERLIPIDEMIA 09/26/2006  . GOUT 05/16/2007  . HYPERTENSION 04/13/2007  . DIVERTICULOSIS, COLON 04/13/2007  . JOINT EFFUSION, KNEE 06/15/2009  . ELEVATED PROSTATE SPECIFIC ANTIGEN 04/14/2007  . ABNORMAL EKG 10/07/2008  . ADVEF, DRUG/MED/BIOL SUBST, OTHER DRUG NOS 09/26/2006  . Glaucoma     sees [facility]   . Diabetes mellitus     type 2, diet controlled   . Renal insufficiency     RESOLVED PER PATIENT  . DEGENERATIVE JOINT DISEASE, MODERATE 04/13/2007  . PROSTATE CANCER 10/05/2009    sees [name]  . Melanoma     sees [name]     Past Surgical History  Procedure Laterality Date  . Vasectomy    . Tonsillectomy    . Bunionectomy      bilateral  . Prostatectomy      per [name]  . Cystectomy      benign from hand  . Colonoscopy  2007    at [facility], clear, repeat in 10 yrs   . Melanoma excision  2012    per [name]  . Knee arthroscopy  Jan. 2013    right knee, per [name]   . Umbilical hernia repair  11/01/2011    Procedure: HERNIA REPAIR UMBILICAL ADULT;  Surgeon: Earnstine Regal, MD;  Location: [place];  Service: General;  Laterality: N/A;  Repair umbilical hernia with mesh patch  . Glaucoma surgery Bilateral 2014   . Ventral hernia repair  01/10/2013    with mesh     [name]  . Ventral  hernia repair N/A 01/10/2013    Procedure: HERNIA REPAIR VENTRAL ADULT ;  Surgeon: Earnstine Regal, MD;  Location: [place];  Service: General;  Laterality: N/A;  . Insertion of mesh N/A 01/10/2013    Procedure: INSERTION OF MESH;  Surgeon: Earnstine Regal, MD;  Location: [place];  Service: General;  Laterality: N/A;    There were no vitals filed for this visit.  Visit Diagnosis:  Right shoulder pain  Shoulder stiffness, right      Subjective Assessment - 08/11/14 0937    Subjective Staes that his shoulder is okay, still has pain in some movements. Reports better mobility of R shoulder. States that he drove 8 hours coming back from vacation.   Limitations Lifting;House hold activities   Patient Stated Goals would like to be able to rest in bed and use arm again   Currently in Pain? Yes   Pain Score 2    Pain Location Shoulder   Pain Orientation Right   Pain Descriptors / Indicators Sore;Aching   Pain Type Surgical pain   Pain Onset More than a month ago  [facility] PT Assessment - 08/11/14 0001    Assessment   Medical Diagnosis S/p right RTC surgery.   Onset Date/Surgical Date 05/05/14   Next MD Visit 08/12/2014   AROM   Overall AROM  Deficits   Overall AROM Comments Scaption 153 deg   AROM Assessment Site Shoulder   Right/Left Shoulder Right   Right Shoulder Flexion 150 Degrees   Right Shoulder Internal Rotation 51 Degrees   Right Shoulder External Rotation 90 Degrees                     [facility] Adult PT Treatment/Exercise - 08/11/14 0001    Shoulder Exercises: Supine   Flexion AROM;Right  x30 reps; wedge for lawnchair progression   Other Supine Exercises Small circles x30 reps   Other Supine Exercises ABCs x2 reps   Shoulder Exercises: Sidelying   External Rotation Strengthening;Right;Weights  x30 reps   External Rotation Weight (lbs) 2   Shoulder Exercises: Standing   Protraction Strengthening;Right;Theraband;Other (comment)  x30 reps   Theraband Level  (Shoulder Protraction) Level 1 (Yellow)   External Rotation Strengthening;Right;Theraband;Other (comment)  x30 reps   Theraband Level (Shoulder External Rotation) Level 1 (Yellow)   Internal Rotation Strengthening;Right;Theraband;Other (comment)  x30 reps   Theraband Level (Shoulder Internal Rotation) Level 1 (Yellow)   Extension Strengthening;Right;Weights  x30 reps   Extension Weight (lbs) 2   Row Strengthening;Right;Theraband  x30 reps   Row Weight (lbs) 2   Shoulder Exercises: Pulleys   Flexion 3 minutes   Other Pulley Exercises UE ranger for elevation and circles 3x10 each way   Other Pulley Exercises Wall ladder x3min   Shoulder Exercises: ROM/Strengthening   Wall Pushups Other (comment)  x30 reps                  PT Short Term Goals - 08/11/14 6269    PT SHORT TERM GOAL #1   Title STG's=LTG's.   Time 4   Period Weeks   Status On-going           PT Long Term Goals - 08/11/14 4854    PT LONG TERM GOAL #1   Title Ind with advanced HEP.   Time 4   Period Weeks   Status Achieved   PT LONG TERM GOAL #2   Title Active right shoulder flexion to 155 degrees so the patient can easily reach overhead   Time 4   Period Weeks   Status On-going  08/11/14 150 deg AROM   PT LONG TERM GOAL #3   Title Active ER to 70 degrees+ to allow for easily donning/doffing of apparel   Time 4   Period Weeks   Status Achieved  08/11/2014 AROM 90 deg   PT LONG TERM GOAL #4   Title Increase ROM so patient is able to reach behind back to L3.   Time 4   Period Weeks   Status On-going  08/11/2014 L4   PT LONG TERM GOAL #5   Title Increase shoulder strength to a solid 4+/5 to increase stability for performance of functional activities   Time 4   Period Weeks   Status On-going   PT LONG TERM GOAL #6   Title Perform ADL's with pain not > 3/10   Time 4   Period Weeks   Status Achieved               Plan - 08/11/14 1019    Clinical Impression Statement Patient  continues to progress in  all regards of PT. Continues to demonstrate good R shoulder stability during stabilization exercises. R shoulder AROM measurements taken today in standing against gravity. AROM R shoulder flex 150 deg, ER 90 deg, IR 51 deg, scaption 153 deg. Denied pain following treatment.   Pt will benefit from skilled therapeutic intervention in order to improve on the following deficits Decreased strength;Decreased mobility;Decreased range of motion   Rehab Potential Excellent   Clinical Impairments Affecting Rehab Potential surgery 05/05/14 ( current 11 weeks 07/21/14)   PT Frequency 2x / week   PT Duration 3 weeks   PT Next Visit Plan Continue per PT POC and RCR protocol per [name]. Continue strengthening of R shoulder. (MD june 7)   Consulted and Agree with Plan of Care Patient        Problem List Patient Active Problem List   Diagnosis Date Noted  . Incisional hernia 08/11/2011  . Renal insufficiency 07/28/2011  . PROSTATE CANCER 10/05/2009  . Coronary atherosclerosis 10/05/2009  . JOINT EFFUSION, KNEE 06/15/2009  . ABNORMAL EKG 10/07/2008  . PROSTATE CANCER, HX OF 10/31/2007  . GOUT 05/16/2007  . ELEVATED PROSTATE SPECIFIC ANTIGEN 04/14/2007  . Essential hypertension 04/13/2007  . DIVERTICULOSIS, COLON 04/13/2007  . DEGENERATIVE JOINT DISEASE, MODERATE 04/13/2007  . Hyperlipidemia 09/26/2006  . ADVEF, DRUG/MED/BIOL SUBST, OTHER DRUG NOS 09/26/2006   Ahmed Prima, PTA 08/11/2014 10:28 AM     [facility] [address] [place], [place], [address] Phone: [phone]   Fax:  [phone]

## 2014-08-12 DIAGNOSIS — S43431D Superior glenoid labrum lesion of right shoulder, subsequent encounter: Secondary | ICD-10-CM | POA: Diagnosis not present

## 2014-08-12 DIAGNOSIS — Z4789 Encounter for other orthopedic aftercare: Secondary | ICD-10-CM | POA: Diagnosis not present

## 2014-08-14 ENCOUNTER — Encounter: Payer: Self-pay | Admitting: Physical Therapy

## 2014-08-14 ENCOUNTER — Ambulatory Visit: Payer: Medicare Other | Admitting: Physical Therapy

## 2014-08-14 DIAGNOSIS — M25511 Pain in right shoulder: Secondary | ICD-10-CM | POA: Diagnosis not present

## 2014-08-14 DIAGNOSIS — M25611 Stiffness of right shoulder, not elsewhere classified: Secondary | ICD-10-CM

## 2014-08-14 NOTE — Therapy (Signed)
[facility] [place], [place], [address] Phone: [phone]   Fax:  [phone]  Physical Therapy Treatment  Patient Details  Name: [name] [name]. MRN: [id number] Date of Birth: [birth year] Referring Provider:  [name], MD  Encounter Date: 08/14/2014      PT End of Session - 08/14/14 0952    Visit Number 25   Number of Visits 26   Date for PT Re-Evaluation 08/04/14   PT Start Time 0948   PT Stop Time 1030   PT Time Calculation (min) 42 min   Activity Tolerance Patient tolerated treatment well   Behavior During Therapy [facility] for tasks assessed/performed      Past Medical History  Diagnosis Date  . HYPERLIPIDEMIA 09/26/2006  . GOUT 05/16/2007  . HYPERTENSION 04/13/2007  . DIVERTICULOSIS, COLON 04/13/2007  . JOINT EFFUSION, KNEE 06/15/2009  . ELEVATED PROSTATE SPECIFIC ANTIGEN 04/14/2007  . ABNORMAL EKG 10/07/2008  . ADVEF, DRUG/MED/BIOL SUBST, OTHER DRUG NOS 09/26/2006  . Glaucoma     sees [facility]   . Diabetes mellitus     type 2, diet controlled   . Renal insufficiency     RESOLVED PER PATIENT  . DEGENERATIVE JOINT DISEASE, MODERATE 04/13/2007  . PROSTATE CANCER 10/05/2009    sees [name]  . Melanoma     sees [name]     Past Surgical History  Procedure Laterality Date  . Vasectomy    . Tonsillectomy    . Bunionectomy      bilateral  . Prostatectomy      per [name]  . Cystectomy      benign from hand  . Colonoscopy  2007    at [facility], clear, repeat in 10 yrs   . Melanoma excision  2012    per [name]  . Knee arthroscopy  Jan. 2013    right knee, per [name]   . Umbilical hernia repair  11/01/2011    Procedure: HERNIA REPAIR UMBILICAL ADULT;  Surgeon: Earnstine Regal, MD;  Location: [place];  Service: General;  Laterality: N/A;  Repair umbilical hernia with mesh patch  . Glaucoma surgery Bilateral 2014   . Ventral hernia repair  01/10/2013   with mesh     [name]  . Ventral hernia repair N/A 01/10/2013    Procedure: HERNIA REPAIR VENTRAL ADULT ;  Surgeon: Earnstine Regal, MD;  Location: [place];  Service: General;  Laterality: N/A;  . Insertion of mesh N/A 01/10/2013    Procedure: INSERTION OF MESH;  Surgeon: Earnstine Regal, MD;  Location: [place];  Service: General;  Laterality: N/A;    There were no vitals filed for this visit.  Visit Diagnosis:  Right shoulder pain  Shoulder stiffness, right      Subjective Assessment - 08/14/14 0950    Subjective States that [name] released him from therapy to complete HEP at home and was pleased with his progress. Stated that he may do the facility's self directed gym program. [name] was a little concerned with the continued soreness but told patient to not overdo exercises.   Limitations Lifting;House hold activities   Patient Stated Goals would like to be able to rest in bed and use arm again   Currently in Pain? Yes   Pain Score 2    Pain Location Shoulder   Pain Orientation Right  [facility] PT Assessment - 08/14/14 0001    Assessment   Medical Diagnosis S/p right RTC surgery.   Onset Date/Surgical Date 05/05/14   Next MD Visit 08/12/2014   ROM / Strength   AROM / PROM / Strength PROM;Strength   AROM   Overall AROM  Within functional limits for tasks performed   Overall AROM Comments Scaption 180 deg   AROM Assessment Site Shoulder   Right/Left Shoulder Right   Right Shoulder Flexion 158 Degrees   Right Shoulder Internal Rotation 70 Degrees   Right Shoulder External Rotation 80 Degrees   Strength   Overall Strength Within functional limits for tasks performed   Strength Assessment Site Shoulder   Right/Left Shoulder Right   Right Shoulder Flexion 5/5   Right Shoulder Internal Rotation 5/5   Right Shoulder External Rotation 5/5                     [facility] Adult PT Treatment/Exercise - 08/14/14 0001    Shoulder Exercises: Sidelying   External  Rotation Strengthening;Right;Weights  x30 reps   External Rotation Weight (lbs) 2   Shoulder Exercises: Standing   Protraction Strengthening;Right;Theraband;Other (comment)  x30 reps   Theraband Level (Shoulder Protraction) Level 2 (Red)   External Rotation Strengthening;Right;Theraband;Other (comment)  x30 reps   Theraband Level (Shoulder External Rotation) Level 2 (Red)   Internal Rotation Strengthening;Right;Theraband;Other (comment)  x30 reps   Theraband Level (Shoulder Internal Rotation) Level 2 (Red)   Internal Rotation Weight (lbs) s   Flexion AROM;Right;Other (comment)  x30 reps   Extension Strengthening;Right;Weights  x30 reps   Extension Weight (lbs) 2   Row Strengthening;Right;Theraband  x30 reps   Row Weight (lbs) 2   Shoulder Exercises: Pulleys   Flexion 3 minutes   Other Pulley Exercises UE ranger for elevation and circles 3x10 each way   Other Pulley Exercises Wall ladder x60mn   Shoulder Exercises: ROM/Strengthening   Wall Pushups Other (comment)  x30 reps   Ball on Wall up/down, L/R, CW, CCW x30 reps each RUE   Other ROM/Strengthening Exercises Ball on wall stabs RUE 8 x30 sec                PT Education - 08/14/14 1009    Education provided Yes   Education Details Educated patient that for strengthening exercises at home to evaluate completion and ease to judge increasing weights; completing ROM exercises in sidelying or standing; also given red theraband.   Person(s) Educated Patient   Methods Explanation;Demonstration;Verbal cues;Handout   Comprehension Verbalized understanding;Returned demonstration;Verbal cues required          PT Short Term Goals - 08/14/14 1038    PT SHORT TERM GOAL #1   Title STG's=LTG's.   Time 4   Period Weeks   Status Achieved           PT Long Term Goals - 08/14/14 1018    PT LONG TERM GOAL #1   Title Ind with advanced HEP.   Time 4   Period Weeks   Status Achieved   PT LONG TERM GOAL #2   Title  Active right shoulder flexion to 155 degrees so the patient can easily reach overhead   Time 4   Period Weeks   Status Achieved   PT LONG TERM GOAL #3   Title Active ER to 70 degrees+ to allow for easily donning/doffing of apparel   Time 4   Period Weeks   Status Achieved   PT LONG  TERM GOAL #4   Title Increase ROM so patient is able to reach behind back to L3.   Time 4   Period Weeks   Status Achieved   PT LONG TERM GOAL #5   Title Increase shoulder strength to a solid 4+/5 to increase stability for performance of functional activities   Time 4   Period Weeks   Status Achieved   PT LONG TERM GOAL #6   Title Perform ADL's with pain not > 3/10   Time 4   Period Weeks   Status Achieved               Plan - 08/14/14 1035    Clinical Impression Statement Patient has progressed well throughout therapy and has acheived all LT goals set at evaluation. Demonstrates good stabilization and strength in R shoulder.  AROM measurments taken in standing today. Denied pain following treatment only fatigue.   Pt will benefit from skilled therapeutic intervention in order to improve on the following deficits Decreased strength;Decreased mobility;Decreased range of motion   Rehab Potential Excellent   Clinical Impairments Affecting Rehab Potential surgery 05/05/14 ( current 11 weeks 07/21/14)   PT Frequency 2x / week   PT Duration 3 weeks   PT Next Visit Plan Communicate with MPT regarding MD requested D/C.   Consulted and Agree with Plan of Care Patient        Problem List Patient Active Problem List   Diagnosis Date Noted  . Incisional hernia 08/11/2011  . Renal insufficiency 07/28/2011  . PROSTATE CANCER 10/05/2009  . Coronary atherosclerosis 10/05/2009  . JOINT EFFUSION, KNEE 06/15/2009  . ABNORMAL EKG 10/07/2008  . PROSTATE CANCER, HX OF 10/31/2007  . GOUT 05/16/2007  . ELEVATED PROSTATE SPECIFIC ANTIGEN 04/14/2007  . Essential hypertension 04/13/2007  . DIVERTICULOSIS,  COLON 04/13/2007  . DEGENERATIVE JOINT DISEASE, MODERATE 04/13/2007  . Hyperlipidemia 09/26/2006  . ADVEF, DRUG/MED/BIOL SUBST, OTHER DRUG NOS 09/26/2006    Ahmed Prima, PTA 08/14/2014 10:44 AM    [facility] [address] [place], [place], [address] Phone: [phone]   Fax:  [phone]

## 2014-08-14 NOTE — Therapy (Addendum)
[facility] [place], [place], [address] Phone: [phone]   Fax:  [phone]  Physical Therapy Treatment  Patient Details  Name: [name] [name]. MRN: [id number] Date of Birth: [birth year] Referring Provider:  [name], MD  Encounter Date: 08/14/2014      PT End of Session - 08/14/14 0952    Visit Number 25   Number of Visits 26   Date for PT Re-Evaluation 08/04/14   PT Start Time 0948   PT Stop Time 1030   PT Time Calculation (min) 42 min   Activity Tolerance Patient tolerated treatment well   Behavior During Therapy [facility] for tasks assessed/performed      Past Medical History  Diagnosis Date  . HYPERLIPIDEMIA 09/26/2006  . GOUT 05/16/2007  . HYPERTENSION 04/13/2007  . DIVERTICULOSIS, COLON 04/13/2007  . JOINT EFFUSION, KNEE 06/15/2009  . ELEVATED PROSTATE SPECIFIC ANTIGEN 04/14/2007  . ABNORMAL EKG 10/07/2008  . ADVEF, DRUG/MED/BIOL SUBST, OTHER DRUG NOS 09/26/2006  . Glaucoma     sees [facility]   . Diabetes mellitus     type 2, diet controlled   . Renal insufficiency     RESOLVED PER PATIENT  . DEGENERATIVE JOINT DISEASE, MODERATE 04/13/2007  . PROSTATE CANCER 10/05/2009    sees [name]  . Melanoma     sees [name]     Past Surgical History  Procedure Laterality Date  . Vasectomy    . Tonsillectomy    . Bunionectomy      bilateral  . Prostatectomy      per [name]  . Cystectomy      benign from hand  . Colonoscopy  2007    at [facility], clear, repeat in 10 yrs   . Melanoma excision  2012    per [name]  . Knee arthroscopy  Jan. 2013    right knee, per [name]   . Umbilical hernia repair  11/01/2011    Procedure: HERNIA REPAIR UMBILICAL ADULT;  Surgeon: Earnstine Regal, MD;  Location: [place];  Service: General;  Laterality: N/A;  Repair umbilical hernia with mesh patch  . Glaucoma surgery Bilateral 2014   . Ventral hernia repair  01/10/2013   with mesh     [name]  . Ventral hernia repair N/A 01/10/2013    Procedure: HERNIA REPAIR VENTRAL ADULT ;  Surgeon: Earnstine Regal, MD;  Location: [place];  Service: General;  Laterality: N/A;  . Insertion of mesh N/A 01/10/2013    Procedure: INSERTION OF MESH;  Surgeon: Earnstine Regal, MD;  Location: [place];  Service: General;  Laterality: N/A;    There were no vitals filed for this visit.  Visit Diagnosis:  Right shoulder pain  Shoulder stiffness, right      Subjective Assessment - 08/14/14 0950    Subjective States that [name] released him from therapy to complete HEP at home and was pleased with his progress. Stated that he may do the facility's self directed gym program. [name] was a little concerned with the continued soreness but told patient to not overdo exercises.   Limitations Lifting;House hold activities   Patient Stated Goals would like to be able to rest in bed and use arm again   Currently in Pain? Yes   Pain Score 2    Pain Location Shoulder   Pain Orientation Right  [facility] PT Assessment - 08/14/14 0001    Assessment   Medical Diagnosis S/p right RTC surgery.   Onset Date/Surgical Date 05/05/14   Next MD Visit 08/12/2014   ROM / Strength   AROM / PROM / Strength PROM;Strength   AROM   Overall AROM  Within functional limits for tasks performed   Overall AROM Comments Scaption 180 deg   AROM Assessment Site Shoulder   Right/Left Shoulder Right   Right Shoulder Flexion 158 Degrees   Right Shoulder Internal Rotation 70 Degrees   Right Shoulder External Rotation 80 Degrees   Strength   Overall Strength Within functional limits for tasks performed   Strength Assessment Site Shoulder   Right/Left Shoulder Right   Right Shoulder Flexion 5/5   Right Shoulder Internal Rotation 5/5   Right Shoulder External Rotation 5/5                     [facility] Adult PT Treatment/Exercise - 08/14/14 0001    Shoulder Exercises: Sidelying   External  Rotation Strengthening;Right;Weights  x30 reps   External Rotation Weight (lbs) 2   Shoulder Exercises: Standing   Protraction Strengthening;Right;Theraband;Other (comment)  x30 reps   Theraband Level (Shoulder Protraction) Level 2 (Red)   External Rotation Strengthening;Right;Theraband;Other (comment)  x30 reps   Theraband Level (Shoulder External Rotation) Level 2 (Red)   Internal Rotation Strengthening;Right;Theraband;Other (comment)  x30 reps   Theraband Level (Shoulder Internal Rotation) Level 2 (Red)   Internal Rotation Weight (lbs) s   Flexion AROM;Right;Other (comment)  x30 reps   Extension Strengthening;Right;Weights  x30 reps   Extension Weight (lbs) 2   Row Strengthening;Right;Theraband  x30 reps   Row Weight (lbs) 2   Shoulder Exercises: Pulleys   Flexion 3 minutes   Other Pulley Exercises UE ranger for elevation and circles 3x10 each way   Other Pulley Exercises Wall ladder x60mn   Shoulder Exercises: ROM/Strengthening   Wall Pushups Other (comment)  x30 reps   Ball on Wall up/down, L/R, CW, CCW x30 reps each RUE   Other ROM/Strengthening Exercises Ball on wall stabs RUE 8 x30 sec                PT Education - 08/14/14 1009    Education provided Yes   Education Details Educated patient that for strengthening exercises at home to evaluate completion and ease to judge increasing weights; completing ROM exercises in sidelying or standing; also given red theraband.   Person(s) Educated Patient   Methods Explanation;Demonstration;Verbal cues;Handout   Comprehension Verbalized understanding;Returned demonstration;Verbal cues required          PT Short Term Goals - 08/14/14 1038    PT SHORT TERM GOAL #1   Title STG's=LTG's.   Time 4   Period Weeks   Status Achieved           PT Long Term Goals - 08/14/14 1018    PT LONG TERM GOAL #1   Title Ind with advanced HEP.   Time 4   Period Weeks   Status Achieved   PT LONG TERM GOAL #2   Title  Active right shoulder flexion to 155 degrees so the patient can easily reach overhead   Time 4   Period Weeks   Status Achieved   PT LONG TERM GOAL #3   Title Active ER to 70 degrees+ to allow for easily donning/doffing of apparel   Time 4   Period Weeks   Status Achieved   PT LONG  TERM GOAL #4   Title Increase ROM so patient is able to reach behind back to L3.   Time 4   Period Weeks   Status Achieved   PT LONG TERM GOAL #5   Title Increase shoulder strength to a solid 4+/5 to increase stability for performance of functional activities   Time 4   Period Weeks   Status Achieved   PT LONG TERM GOAL #6   Title Perform ADL's with pain not > 3/10   Time 4   Period Weeks   Status Achieved               Plan - 09/07/2014 1035    Clinical Impression Statement Patient has progressed well throughout therapy and has acheived all LT goals set at evaluation. Demonstrates good stabilization and strength in R shoulder.  AROM measurments taken in standing today. Denied pain following treatment only fatigue.   Pt will benefit from skilled therapeutic intervention in order to improve on the following deficits Decreased strength;Decreased mobility;Decreased range of motion   Rehab Potential Excellent   Clinical Impairments Affecting Rehab Potential surgery 05/05/14 ( current 11 weeks 07/21/14)   PT Frequency 2x / week   PT Duration 3 weeks   PT Next Visit Plan Communicate with MPT regarding MD requested D/C.   Consulted and Agree with Plan of Care Patient          G-Codes - Sep 07, 2014 1544    Functional Assessment Tool Used FOTO.   Functional Limitation Mobility: Walking and moving around   Mobility: Walking and Moving Around Current Status [phone]) At least 20 percent but less than 40 percent impaired, limited or restricted   Mobility: Walking and Moving Around Goal Status [phone]) At least 40 percent but less than 60 percent impaired, limited or restricted   Mobility: Walking and Moving  Around Discharge Status [phone]) At least 20 percent but less than 40 percent impaired, limited or restricted      Problem List Patient Active Problem List   Diagnosis Date Noted  . Incisional hernia 08/11/2011  . Renal insufficiency 07/28/2011  . PROSTATE CANCER 10/05/2009  . Coronary atherosclerosis 10/05/2009  . JOINT EFFUSION, KNEE 06/15/2009  . ABNORMAL EKG 10/07/2008  . PROSTATE CANCER, HX OF 10/31/2007  . GOUT 05/16/2007  . ELEVATED PROSTATE SPECIFIC ANTIGEN 04/14/2007  . Essential hypertension 04/13/2007  . DIVERTICULOSIS, COLON 04/13/2007  . DEGENERATIVE JOINT DISEASE, MODERATE 04/13/2007  . Hyperlipidemia 09/26/2006  . ADVEF, DRUG/MED/BIOL SUBST, OTHER DRUG NOS 09/26/2006   PHYSICAL THERAPY DISCHARGE SUMMARY  Visits from Start of Care: 25  Current functional level related to goals / functional outcomes: Please see above.   Remaining deficits: None reported.   Education / Equipment: HEP. Plan: Patient agrees to discharge.  Patient goals were met. Patient is being discharged due to meeting the stated rehab goals.  ?????      Dacotah Cabello, [place] MPT Sep 07, 2014, 3:45 PM  [facility] [address] [place], [place], [address] Phone: [phone]   Fax:  [phone]

## 2014-09-01 ENCOUNTER — Other Ambulatory Visit: Payer: Self-pay

## 2014-09-09 DIAGNOSIS — S43431D Superior glenoid labrum lesion of right shoulder, subsequent encounter: Secondary | ICD-10-CM | POA: Diagnosis not present

## 2014-09-09 DIAGNOSIS — Z4789 Encounter for other orthopedic aftercare: Secondary | ICD-10-CM | POA: Diagnosis not present

## 2014-10-31 DIAGNOSIS — C61 Malignant neoplasm of prostate: Secondary | ICD-10-CM | POA: Diagnosis not present

## 2014-11-03 DIAGNOSIS — Z1283 Encounter for screening for malignant neoplasm of skin: Secondary | ICD-10-CM | POA: Diagnosis not present

## 2014-11-03 DIAGNOSIS — Z08 Encounter for follow-up examination after completed treatment for malignant neoplasm: Secondary | ICD-10-CM | POA: Diagnosis not present

## 2014-11-03 DIAGNOSIS — Z85820 Personal history of malignant melanoma of skin: Secondary | ICD-10-CM | POA: Diagnosis not present

## 2014-11-05 DIAGNOSIS — N5201 Erectile dysfunction due to arterial insufficiency: Secondary | ICD-10-CM | POA: Diagnosis not present

## 2014-11-05 DIAGNOSIS — M25562 Pain in left knee: Secondary | ICD-10-CM | POA: Diagnosis not present

## 2014-11-05 DIAGNOSIS — C61 Malignant neoplasm of prostate: Secondary | ICD-10-CM | POA: Diagnosis not present

## 2014-11-05 DIAGNOSIS — N393 Stress incontinence (female) (male): Secondary | ICD-10-CM | POA: Diagnosis not present

## 2014-11-06 ENCOUNTER — Encounter: Payer: Self-pay | Admitting: Family Medicine

## 2014-11-06 ENCOUNTER — Telehealth: Payer: Self-pay | Admitting: *Deleted

## 2014-11-06 ENCOUNTER — Ambulatory Visit (INDEPENDENT_AMBULATORY_CARE_PROVIDER_SITE_OTHER): Payer: Medicare Other | Admitting: Family Medicine

## 2014-11-06 ENCOUNTER — Ambulatory Visit (INDEPENDENT_AMBULATORY_CARE_PROVIDER_SITE_OTHER)
Admission: RE | Admit: 2014-11-06 | Discharge: 2014-11-06 | Disposition: A | Discharge status: Home / Self Care | Payer: Medicare Other | Source: Ambulatory Visit | Attending: Family Medicine | Admitting: Family Medicine

## 2014-11-06 VITALS — BP 135/76 | HR 68 | Temp 98.4°F | Ht 66.0 in | Wt 206.0 lb

## 2014-11-06 DIAGNOSIS — Z23 Encounter for immunization: Secondary | ICD-10-CM

## 2014-11-06 DIAGNOSIS — R739 Hyperglycemia, unspecified: Secondary | ICD-10-CM | POA: Diagnosis not present

## 2014-11-06 DIAGNOSIS — I10 Essential (primary) hypertension: Secondary | ICD-10-CM

## 2014-11-06 DIAGNOSIS — I2510 Atherosclerotic heart disease of native coronary artery without angina pectoris: Secondary | ICD-10-CM

## 2014-11-06 DIAGNOSIS — C61 Malignant neoplasm of prostate: Secondary | ICD-10-CM | POA: Diagnosis not present

## 2014-11-06 DIAGNOSIS — E119 Type 2 diabetes mellitus without complications: Secondary | ICD-10-CM | POA: Diagnosis not present

## 2014-11-06 DIAGNOSIS — E785 Hyperlipidemia, unspecified: Secondary | ICD-10-CM

## 2014-11-06 DIAGNOSIS — M109 Gout, unspecified: Secondary | ICD-10-CM

## 2014-11-06 DIAGNOSIS — Z8546 Personal history of malignant neoplasm of prostate: Secondary | ICD-10-CM | POA: Diagnosis not present

## 2014-11-06 LAB — POCT URINALYSIS DIPSTICK
BILIRUBIN UA: NEGATIVE
Blood, UA: NEGATIVE
Glucose, UA: NEGATIVE
KETONES UA: NEGATIVE
LEUKOCYTES UA: NEGATIVE
Nitrite, UA: NEGATIVE
PH UA: 5.5
Spec Grav, UA: >=1.03
Urobilinogen, UA: 0.2

## 2014-11-06 LAB — BASIC METABOLIC PANEL
BUN: 20 mg/dL (ref 6–23)
CHLORIDE: 109 meq/L (ref 96–112)
CO2: 25 mEq/L (ref 19–32)
Calcium: 10 mg/dL (ref 8.4–10.5)
Creatinine, Ser: 1.31 mg/dL (ref 0.40–1.50)
GFR: 58 mL/min — ABNORMAL LOW (ref >60.00)
GLUCOSE: 158 mg/dL — AB (ref 70–99)
POTASSIUM: 4.3 meq/L (ref 3.5–5.1)
Sodium: 143 mEq/L (ref 135–145)

## 2014-11-06 LAB — CBC WITH DIFFERENTIAL/PLATELET
BASOS PCT: 0 % (ref 0.0–3.0)
Basophils Absolute: 0 10*3/uL (ref 0.0–0.1)
EOS ABS: 0 10*3/uL (ref 0.0–0.7)
EOS PCT: 0 % (ref 0.0–5.0)
HEMATOCRIT: 42.6 % (ref 39.0–52.0)
Hemoglobin: 14.2 g/dL (ref 13.0–17.0)
LYMPHS PCT: 6.9 % — AB (ref 12.0–46.0)
Lymphs Abs: 1.5 10*3/uL (ref 0.7–4.0)
MCHC: 33.3 g/dL (ref 30.0–36.0)
MCV: 88.9 fl (ref 78.0–100.0)
MONOS PCT: 4.7 % (ref 3.0–12.0)
Monocytes Absolute: 1 10*3/uL (ref 0.1–1.0)
NEUTROS ABS: 19.1 10*3/uL — AB (ref 1.4–7.7)
Neutrophils Relative %: 88.4 % — ABNORMAL HIGH (ref 43.0–77.0)
PLATELETS: 244 10*3/uL (ref 150.0–400.0)
RBC: 4.79 Mil/uL (ref 4.22–5.81)
RDW: 13.8 % (ref 11.5–15.5)
WBC: 21.6 10*3/uL (ref 4.0–10.5)

## 2014-11-06 LAB — HEPATIC FUNCTION PANEL
ALBUMIN: 4.6 g/dL (ref 3.5–5.2)
ALT: 19 U/L (ref 0–53)
AST: 14 U/L (ref 0–37)
Alkaline Phosphatase: 36 U/L — ABNORMAL LOW (ref 39–117)
Bilirubin, Direct: 0.1 mg/dL (ref 0.0–0.3)
Total Bilirubin: 0.4 mg/dL (ref 0.2–1.2)
Total Protein: 7.3 g/dL (ref 6.0–8.3)

## 2014-11-06 LAB — LIPID PANEL
CHOLESTEROL: 177 mg/dL (ref 0–200)
HDL: 59.7 mg/dL (ref >39.00)
LDL Cholesterol: 104 mg/dL — ABNORMAL HIGH (ref 0–99)
NonHDL: 117.26
TRIGLYCERIDES: 65 mg/dL (ref 0.0–149.0)
Total CHOL/HDL Ratio: 3
VLDL: 13 mg/dL (ref 0.0–40.0)

## 2014-11-06 LAB — HEMOGLOBIN A1C: Hgb A1c MFr Bld: 6.2 % (ref 4.6–6.5)

## 2014-11-06 LAB — URIC ACID: Uric Acid, Serum: 4.4 mg/dL (ref 4.0–7.8)

## 2014-11-06 LAB — TSH: TSH: 1.91 u[IU]/mL (ref 0.35–4.50)

## 2014-11-06 NOTE — Progress Notes (Signed)
   Subjective:    Patient ID: [name] [name]., male    DOB: [birth year], 69 y.o.   MRN: [id number]  HPI 69 yr old male to follow up multiple issues. He is seeing Dr. Roni Bread frequently for an increasing PSA (despite being S/P a total prostatectomy for cancer). His last PSA was 0.23 and this has been doubling about every year. He recently had a negative bone scan through the [place] system. Dr. Roni Bread is considering using hormonal therapy at some point. [name] [name] feels good in general. He is recovering well from right shoulder surgery about 6 months ago. His BP is stable. He is starting to exercise again.    Review of Systems  Constitutional: Negative.   HENT: Negative.   Eyes: Negative.   Respiratory: Negative.   Cardiovascular: Negative.   Gastrointestinal: Negative.   Genitourinary: Negative.   Musculoskeletal: Negative.   Skin: Negative.   Neurological: Negative.   Psychiatric/Behavioral: Negative.        Objective:   Physical Exam  Constitutional: He is oriented to person, place, and time. He appears well-developed and well-nourished. No distress.  HENT:  Head: Normocephalic and atraumatic.  Right Ear: External ear normal.  Left Ear: External ear normal.  Nose: Nose normal.  Mouth/Throat: Oropharynx is clear and moist. No oropharyngeal exudate.  Eyes: Conjunctivae and EOM are normal. Pupils are equal, round, and reactive to light. Right eye exhibits no discharge. Left eye exhibits no discharge. No scleral icterus.  Neck: Neck supple. No JVD present. No tracheal deviation present. No thyromegaly present.  Cardiovascular: Normal rate, regular rhythm, normal heart sounds and intact distal pulses.  Exam reveals no gallop and no friction rub.   No murmur heard. EKG normal   Pulmonary/Chest: Effort normal and breath sounds normal. No respiratory distress. He has no wheezes. He has no rales. He exhibits no tenderness.  Abdominal: Soft. Bowel sounds are normal. He exhibits no distension and no  mass. There is no tenderness. There is no rebound and no guarding.  Musculoskeletal: Normal range of motion. He exhibits no edema or tenderness.  Lymphadenopathy:    He has no cervical adenopathy.  Neurological: He is alert and oriented to person, place, and time. He has normal reflexes. No cranial nerve deficit. He exhibits normal muscle tone. Coordination normal.  Skin: Skin is warm and dry. No rash noted. He is not diaphoretic. No erythema. No pallor.  Psychiatric: He has a normal mood and affect. His behavior is normal. Judgment and thought content normal.          Assessment & Plan:  His HTN and CAD are stable. Get fasting labs today for lipids and A1c to follow his hyperlipidemia and elevated glucose. Check a uric acid level for his gout. He will follow up wit Dr. Roni Bread for the PSA.

## 2014-11-06 NOTE — Progress Notes (Signed)
Pre visit review using our clinic review tool, if applicable. No additional management support is needed unless otherwise documented below in the visit note. 

## 2014-11-06 NOTE — Telephone Encounter (Signed)
Was made aware of critical lab result WBC 21.6. Spoke with MD Burchette and he advised to contact patient to see what symptoms, if any, patient was having. If asymptomatic, get patient on schedule for tomorrow to see PCP. Called and spoke with patient, patient states he feels great and has no symptoms. Appointment made for tomorrow morning at 10:30 to see PCP.

## 2014-11-07 ENCOUNTER — Ambulatory Visit: Payer: PRIVATE HEALTH INSURANCE | Admitting: Family Medicine

## 2014-12-10 DIAGNOSIS — M25562 Pain in left knee: Secondary | ICD-10-CM | POA: Diagnosis not present

## 2014-12-18 DIAGNOSIS — M25562 Pain in left knee: Secondary | ICD-10-CM | POA: Diagnosis not present

## 2014-12-25 DIAGNOSIS — M1712 Unilateral primary osteoarthritis, left knee: Secondary | ICD-10-CM | POA: Diagnosis not present

## 2014-12-25 DIAGNOSIS — S83242A Other tear of medial meniscus, current injury, left knee, initial encounter: Secondary | ICD-10-CM | POA: Diagnosis not present

## 2014-12-26 DIAGNOSIS — H401131 Primary open-angle glaucoma, bilateral, mild stage: Secondary | ICD-10-CM | POA: Diagnosis not present

## 2015-01-14 DIAGNOSIS — G8918 Other acute postprocedural pain: Secondary | ICD-10-CM | POA: Diagnosis not present

## 2015-01-14 DIAGNOSIS — Y929 Unspecified place or not applicable: Secondary | ICD-10-CM | POA: Diagnosis not present

## 2015-01-14 DIAGNOSIS — Y999 Unspecified external cause status: Secondary | ICD-10-CM | POA: Diagnosis not present

## 2015-01-14 DIAGNOSIS — M659 Synovitis and tenosynovitis, unspecified: Secondary | ICD-10-CM | POA: Diagnosis not present

## 2015-01-14 DIAGNOSIS — M94262 Chondromalacia, left knee: Secondary | ICD-10-CM | POA: Diagnosis not present

## 2015-01-14 DIAGNOSIS — S83282A Other tear of lateral meniscus, current injury, left knee, initial encounter: Secondary | ICD-10-CM | POA: Diagnosis not present

## 2015-01-14 DIAGNOSIS — M6752 Plica syndrome, left knee: Secondary | ICD-10-CM | POA: Diagnosis not present

## 2015-01-14 DIAGNOSIS — S83232A Complex tear of medial meniscus, current injury, left knee, initial encounter: Secondary | ICD-10-CM | POA: Diagnosis not present

## 2015-01-14 HISTORY — PX: KNEE ARTHROSCOPY: SUR90

## 2015-01-15 ENCOUNTER — Ambulatory Visit (INDEPENDENT_AMBULATORY_CARE_PROVIDER_SITE_OTHER): Payer: Medicare Other | Admitting: Family Medicine

## 2015-01-15 ENCOUNTER — Encounter: Payer: Self-pay | Admitting: Family Medicine

## 2015-01-15 VITALS — BP 161/81 | HR 48 | Temp 98.3°F

## 2015-01-15 DIAGNOSIS — I2510 Atherosclerotic heart disease of native coronary artery without angina pectoris: Secondary | ICD-10-CM | POA: Diagnosis not present

## 2015-01-15 DIAGNOSIS — R309 Painful micturition, unspecified: Secondary | ICD-10-CM

## 2015-01-15 DIAGNOSIS — N390 Urinary tract infection, site not specified: Secondary | ICD-10-CM

## 2015-01-15 DIAGNOSIS — R300 Dysuria: Secondary | ICD-10-CM | POA: Diagnosis not present

## 2015-01-15 LAB — POCT URINALYSIS DIPSTICK
BILIRUBIN UA: NEGATIVE
Blood, UA: NEGATIVE
Glucose, UA: NEGATIVE
Ketones, UA: NEGATIVE
Leukocytes, UA: NEGATIVE
Nitrite, UA: NEGATIVE
PH UA: 6
PROTEIN UA: NEGATIVE
Spec Grav, UA: 1.015
Urobilinogen, UA: 0.2

## 2015-01-15 MED ORDER — SULFAMETHOXAZOLE-TRIMETHOPRIM 800-160 MG PO TABS: 1.0000 | ORAL_TABLET | Freq: Two times a day (BID) | ORAL | Status: DC

## 2015-01-15 NOTE — Progress Notes (Signed)
   Subjective:    Patient ID: Usama Boshers., male    DOB: 1945/06/05, 69 y.o.   MRN: [id number]  HPI Here for one week of urinary burning, and increased frequency and urgency. No back pain or fever. He drinks lots of water.    Review of Systems  Constitutional: Negative.   Respiratory: Negative.   Cardiovascular: Negative.   Gastrointestinal: Negative.   Genitourinary: Positive for dysuria, urgency and frequency. Negative for hematuria, flank pain and difficulty urinating.       Objective:   Physical Exam  Constitutional: He is oriented to person, place, and time. He appears well-developed and well-nourished.  Cardiovascular: Normal rate, regular rhythm, normal heart sounds and intact distal pulses.   Pulmonary/Chest: Effort normal and breath sounds normal.  Abdominal: Soft. Bowel sounds are normal. He exhibits no distension and no mass. There is no tenderness. There is no rebound and no guarding.  Neurological: He is alert and oriented to person, place, and time.          Assessment & Plan:  UTI, treat with Bactrim DS. Culture the sample

## 2015-01-15 NOTE — Progress Notes (Signed)
Pre visit review using our clinic review tool, if applicable. No additional management support is needed unless otherwise documented below in the visit note. Pt unable to weigh 

## 2015-01-16 LAB — URINE CULTURE
Colony Count: NO GROWTH
Organism ID, Bacteria: NO GROWTH

## 2015-01-27 DIAGNOSIS — Z4789 Encounter for other orthopedic aftercare: Secondary | ICD-10-CM | POA: Diagnosis not present

## 2015-01-27 DIAGNOSIS — S83242D Other tear of medial meniscus, current injury, left knee, subsequent encounter: Secondary | ICD-10-CM | POA: Diagnosis not present

## 2015-02-02 DIAGNOSIS — Z1283 Encounter for screening for malignant neoplasm of skin: Secondary | ICD-10-CM | POA: Diagnosis not present

## 2015-02-02 DIAGNOSIS — Z85820 Personal history of malignant melanoma of skin: Secondary | ICD-10-CM | POA: Diagnosis not present

## 2015-02-02 DIAGNOSIS — Z08 Encounter for follow-up examination after completed treatment for malignant neoplasm: Secondary | ICD-10-CM | POA: Diagnosis not present

## 2015-03-03 DIAGNOSIS — S83242D Other tear of medial meniscus, current injury, left knee, subsequent encounter: Secondary | ICD-10-CM | POA: Diagnosis not present

## 2015-03-31 DIAGNOSIS — H2513 Age-related nuclear cataract, bilateral: Secondary | ICD-10-CM | POA: Diagnosis not present

## 2015-03-31 DIAGNOSIS — H401131 Primary open-angle glaucoma, bilateral, mild stage: Secondary | ICD-10-CM | POA: Diagnosis not present

## 2015-04-02 DIAGNOSIS — C61 Malignant neoplasm of prostate: Secondary | ICD-10-CM | POA: Diagnosis not present

## 2015-04-09 DIAGNOSIS — Z0000 Encounter for general adult medical examination without abnormal findings: Secondary | ICD-10-CM | POA: Diagnosis not present

## 2015-04-09 DIAGNOSIS — N393 Stress incontinence (female) (male): Secondary | ICD-10-CM | POA: Diagnosis not present

## 2015-04-09 DIAGNOSIS — C61 Malignant neoplasm of prostate: Secondary | ICD-10-CM | POA: Diagnosis not present

## 2015-04-09 DIAGNOSIS — N5201 Erectile dysfunction due to arterial insufficiency: Secondary | ICD-10-CM | POA: Diagnosis not present

## 2015-04-13 DIAGNOSIS — Z85820 Personal history of malignant melanoma of skin: Secondary | ICD-10-CM | POA: Diagnosis not present

## 2015-04-13 DIAGNOSIS — L219 Seborrheic dermatitis, unspecified: Secondary | ICD-10-CM | POA: Diagnosis not present

## 2015-04-13 DIAGNOSIS — Z08 Encounter for follow-up examination after completed treatment for malignant neoplasm: Secondary | ICD-10-CM | POA: Diagnosis not present

## 2015-04-13 DIAGNOSIS — Z1283 Encounter for screening for malignant neoplasm of skin: Secondary | ICD-10-CM | POA: Diagnosis not present

## 2015-05-04 DIAGNOSIS — H401131 Primary open-angle glaucoma, bilateral, mild stage: Secondary | ICD-10-CM | POA: Diagnosis not present

## 2015-05-04 DIAGNOSIS — H43811 Vitreous degeneration, right eye: Secondary | ICD-10-CM | POA: Diagnosis not present

## 2015-05-06 ENCOUNTER — Ambulatory Visit (INDEPENDENT_AMBULATORY_CARE_PROVIDER_SITE_OTHER): Payer: Medicare Other | Admitting: Family Medicine

## 2015-05-06 ENCOUNTER — Encounter: Payer: Self-pay | Admitting: Family Medicine

## 2015-05-06 VITALS — BP 130/69 | HR 51 | Temp 98.5°F | Ht 66.0 in | Wt 207.0 lb

## 2015-05-06 DIAGNOSIS — I10 Essential (primary) hypertension: Secondary | ICD-10-CM | POA: Diagnosis not present

## 2015-05-06 DIAGNOSIS — N289 Disorder of kidney and ureter, unspecified: Secondary | ICD-10-CM

## 2015-05-06 DIAGNOSIS — Z0000 Encounter for general adult medical examination without abnormal findings: Secondary | ICD-10-CM

## 2015-05-06 DIAGNOSIS — E785 Hyperlipidemia, unspecified: Secondary | ICD-10-CM | POA: Diagnosis not present

## 2015-05-06 DIAGNOSIS — R739 Hyperglycemia, unspecified: Secondary | ICD-10-CM | POA: Diagnosis not present

## 2015-05-06 LAB — HEPATIC FUNCTION PANEL
ALT: 22 U/L (ref 0–53)
AST: 19 U/L (ref 0–37)
Albumin: 4.8 g/dL (ref 3.5–5.2)
Alkaline Phosphatase: 39 U/L (ref 39–117)
BILIRUBIN TOTAL: 0.6 mg/dL (ref 0.2–1.2)
Bilirubin, Direct: 0.1 mg/dL (ref 0.0–0.3)
Total Protein: 7.3 g/dL (ref 6.0–8.3)

## 2015-05-06 LAB — HEMOGLOBIN A1C: Hgb A1c MFr Bld: 6.6 % — ABNORMAL HIGH (ref 4.6–6.5)

## 2015-05-06 LAB — BASIC METABOLIC PANEL
BUN: 19 mg/dL (ref 6–23)
CALCIUM: 9.8 mg/dL (ref 8.4–10.5)
CO2: 28 mEq/L (ref 19–32)
Chloride: 105 mEq/L (ref 96–112)
Creatinine, Ser: 1.54 mg/dL — ABNORMAL HIGH (ref 0.40–1.50)
GFR: 48.06 mL/min — ABNORMAL LOW (ref >60.00)
GLUCOSE: 105 mg/dL — AB (ref 70–99)
Potassium: 4.3 mEq/L (ref 3.5–5.1)
SODIUM: 140 meq/L (ref 135–145)

## 2015-05-06 LAB — LIPID PANEL
CHOLESTEROL: 140 mg/dL (ref 0–200)
HDL: 45.3 mg/dL (ref >39.00)
LDL CALC: 71 mg/dL (ref 0–99)
NonHDL: 94.71
TRIGLYCERIDES: 118 mg/dL (ref 0.0–149.0)
Total CHOL/HDL Ratio: 3
VLDL: 23.6 mg/dL (ref 0.0–40.0)

## 2015-05-06 NOTE — Progress Notes (Signed)
   Subjective:    Patient ID: [name] Petzold., male    DOB: [birth year], 70 y.o.   MRN: [id number]  HPI Here to follow up on issues. He feels well and his BP is stable. He is set for cataract surgery in the next month or so.    Review of Systems  Constitutional: Negative.   Respiratory: Negative.   Cardiovascular: Negative.   Neurological: Negative.        Objective:   Physical Exam  Constitutional: He is oriented to person, place, and time. He appears well-developed and well-nourished.  Neck: No thyromegaly present.  Cardiovascular: Normal rate, regular rhythm, normal heart sounds and intact distal pulses.   Pulmonary/Chest: Effort normal and breath sounds normal.  Lymphadenopathy:    He has no cervical adenopathy.  Neurological: He is alert and oriented to person, place, and time.          Assessment & Plan:  His HTN is stable. He will get fasting labs today to check his lipids, glucose, and renal function.

## 2015-05-06 NOTE — Progress Notes (Signed)
Pre visit review using our clinic review tool, if applicable. No additional management support is needed unless otherwise documented below in the visit note. 

## 2015-05-19 ENCOUNTER — Encounter: Payer: Self-pay | Admitting: Gastroenterology

## 2015-06-25 DIAGNOSIS — M25511 Pain in right shoulder: Secondary | ICD-10-CM | POA: Diagnosis not present

## 2015-07-06 ENCOUNTER — Ambulatory Visit (AMBULATORY_SURGERY_CENTER): Payer: Self-pay | Admitting: *Deleted

## 2015-07-06 VITALS — Ht 66.0 in | Wt 193.2 lb

## 2015-07-06 DIAGNOSIS — Z1211 Encounter for screening for malignant neoplasm of colon: Secondary | ICD-10-CM

## 2015-07-06 MED ORDER — NA SULFATE-K SULFATE-MG SULF 17.5-3.13-1.6 GM/177ML PO SOLN: 1.0000 | Freq: Once | ORAL | Status: DC

## 2015-07-06 NOTE — Progress Notes (Signed)
No egg or soy allergy known to patient  No issues with past sedation with any surgeries  or procedures, no intubation problems  No diet pills per patient No home 02 use per patient  No blood thinners per patient  Pt denies issues with constipation   

## 2015-07-10 HISTORY — PX: COLONOSCOPY: SHX174

## 2015-07-13 DIAGNOSIS — Z08 Encounter for follow-up examination after completed treatment for malignant neoplasm: Secondary | ICD-10-CM | POA: Diagnosis not present

## 2015-07-13 DIAGNOSIS — Z85820 Personal history of malignant melanoma of skin: Secondary | ICD-10-CM | POA: Diagnosis not present

## 2015-07-13 DIAGNOSIS — Z1283 Encounter for screening for malignant neoplasm of skin: Secondary | ICD-10-CM | POA: Diagnosis not present

## 2015-07-20 ENCOUNTER — Encounter: Payer: Self-pay | Admitting: Gastroenterology

## 2015-07-20 ENCOUNTER — Ambulatory Visit (AMBULATORY_SURGERY_CENTER): Payer: Medicare Other | Admitting: Gastroenterology

## 2015-07-20 VITALS — BP 133/82 | HR 46 | Temp 98.4°F | Resp 11 | Ht 66.0 in | Wt 193.0 lb

## 2015-07-20 DIAGNOSIS — K635 Polyp of colon: Secondary | ICD-10-CM

## 2015-07-20 DIAGNOSIS — D120 Benign neoplasm of cecum: Secondary | ICD-10-CM

## 2015-07-20 DIAGNOSIS — Z1211 Encounter for screening for malignant neoplasm of colon: Secondary | ICD-10-CM

## 2015-07-20 DIAGNOSIS — I10 Essential (primary) hypertension: Secondary | ICD-10-CM | POA: Diagnosis not present

## 2015-07-20 MED ORDER — SODIUM CHLORIDE 0.9 % IV SOLN: 500.0000 mL | INTRAVENOUS | Status: DC

## 2015-07-20 NOTE — Progress Notes (Signed)
Report to PACU, RN, vss, BBS= Clear.  

## 2015-07-20 NOTE — Progress Notes (Signed)
Called to room to assist during endoscopic procedure.  Patient ID and intended procedure confirmed with present staff. Received instructions for my participation in the procedure from the performing physician.  

## 2015-07-20 NOTE — Op Note (Signed)
[place] Patient Name: [name] [name] Procedure Date: 07/20/2015 8:32 AM MRN: [id number] Endoscopist: Milus Banister , MD Age: 70 Referring MD:  Date of Birth: 11/08/1945 Gender: Male Procedure:                Colonoscopy Indications:              Screening for colorectal malignant neoplasm Medicines:                Monitored Anesthesia Care Procedure:                Pre-Anesthesia Assessment:                           - Prior to the procedure, a History and Physical                            was performed, and patient medications and                            allergies were reviewed. The patient's tolerance of                            previous anesthesia was also reviewed. The risks                            and benefits of the procedure and the sedation                            options and risks were discussed with the patient.                            All questions were answered, and informed consent                            was obtained. Prior Anticoagulants: The patient has                            taken no previous anticoagulant or antiplatelet                            agents. ASA Grade Assessment: II - A patient with                            mild systemic disease. After reviewing the risks                            and benefits, the patient was deemed in                            satisfactory condition to undergo the procedure.                           After obtaining informed consent, the colonoscope  was passed under direct vision. Throughout the                            procedure, the patient's blood pressure, pulse, and                            oxygen saturations were monitored continuously. The                            Model CF-HQ190L [id number]) scope was introduced                            through the anus and advanced to the the cecum,                            identified by appendiceal orifice and  ileocecal                            valve. The colonoscopy was performed without                            difficulty. The patient tolerated the procedure                            well. The quality of the bowel preparation was                            excellent. The ileocecal valve, appendiceal                            orifice, and rectum were photographed. Scope In: 8:37:55 AM Scope Out: 8:45:19 AM Scope Withdrawal Time: 0 hours 5 minutes 58 seconds  Total Procedure Duration: 0 hours 7 minutes 24 seconds  Findings:                 A 3 mm polyp was found in the cecum. The polyp was                            sessile. The polyp was removed with a cold snare.                            Resection and retrieval were complete.                           A few small-mouthed diverticula were found in the                            left colon.                           The exam was otherwise without abnormality on                            direct and retroflexion views. Complications:  No immediate complications. Estimated blood loss:                            None. Estimated Blood Loss:     Estimated blood loss: none. Impression:               - One 3 mm polyp in the cecum, removed with a cold                            snare. Resected and retrieved.                           - Diverticulosis in the left colon.                           - The examination was otherwise normal on direct                            and retroflexion views. Recommendation:           - Patient has a contact number available for                            emergencies. The signs and symptoms of potential                            delayed complications were discussed with the                            patient. Return to normal activities tomorrow.                            Written discharge instructions were provided to the                            patient.                           - Resume  previous diet.                           - Continue present medications.                           You will receive a letter within 2-3 weeks with the                            pathology results and my final recommendations.                           If the polyp(s) is proven to be 'pre-cancerous' on                            pathology, you will need repeat colonoscopy in 5  years. If the polyp(s) is NOT 'precancerous' on                            pathology then you should repeat colon cancer                            screening in 10 years with colonoscopy without need                            for colon cancer screening by any method prior to                            then (including stool testing). Milus Banister, MD 07/20/2015 8:47:39 AM This report has been signed electronically.

## 2015-07-20 NOTE — Patient Instructions (Signed)
Discharge instructions given. Handouts on polyps and diverticulosis. Resume previous medications. YOU HAD AN ENDOSCOPIC PROCEDURE TODAY AT THE [facility]:   Refer to the procedure report that was given to you for any specific questions about what was found during the examination.  If the procedure report does not answer your questions, please call your gastroenterologist to clarify.  If you requested that your care partner not be given the details of your procedure findings, then the procedure report has been included in a sealed envelope for you to review at your convenience later.  YOU SHOULD EXPECT: Some feelings of bloating in the abdomen. Passage of more gas than usual.  Walking can help get rid of the air that was put into your GI tract during the procedure and reduce the bloating. If you had a lower endoscopy (such as a colonoscopy or flexible sigmoidoscopy) you may notice spotting of blood in your stool or on the toilet paper. If you underwent a bowel prep for your procedure, you may not have a normal bowel movement for a few days.  Please Note:  You might notice some irritation and congestion in your nose or some drainage.  This is from the oxygen used during your procedure.  There is no need for concern and it should clear up in a day or so.  SYMPTOMS TO REPORT IMMEDIATELY:   Following lower endoscopy (colonoscopy or flexible sigmoidoscopy):  Excessive amounts of blood in the stool  Significant tenderness or worsening of abdominal pains  Swelling of the abdomen that is new, acute  Fever of 100F or higher   For urgent or emergent issues, a gastroenterologist can be reached at any hour by calling [phone].   DIET: Your first meal following the procedure should be a small meal and then it is ok to progress to your normal diet. Heavy or fried foods are harder to digest and may make you feel nauseous or bloated.  Likewise, meals heavy in dairy and vegetables can  increase bloating.  Drink plenty of fluids but you should avoid alcoholic beverages for 24 hours.  ACTIVITY:  You should plan to take it easy for the rest of today and you should NOT DRIVE or use heavy machinery until tomorrow (because of the sedation medicines used during the test).    FOLLOW UP: Our staff will call the number listed on your records the next business day following your procedure to check on you and address any questions or concerns that you may have regarding the information given to you following your procedure. If we do not reach you, we will leave a message.  However, if you are feeling well and you are not experiencing any problems, there is no need to return our call.  We will assume that you have returned to your regular daily activities without incident.  If any biopsies were taken you will be contacted by phone or by letter within the next 1-3 weeks.  Please call us at [phone] if you have not heard about the biopsies in 3 weeks.    SIGNATURES/CONFIDENTIALITY: You and/or your care partner have signed paperwork which will be entered into your electronic medical record.  These signatures attest to the fact that that the information above on your After Visit Summary has been reviewed and is understood.  Full responsibility of the confidentiality of this discharge information lies with you and/or your care-partner. 

## 2015-07-21 ENCOUNTER — Telehealth: Payer: Self-pay

## 2015-07-21 NOTE — Telephone Encounter (Signed)
  Follow up Call-  Call back number 07/20/2015  Post procedure Call Back phone  # [phone]  Permission to leave phone message Yes     Patient questions:  Do you have a fever, pain , or abdominal swelling? No. Pain Score  0 *  Have you tolerated food without any problems? Yes.    Have you been able to return to your normal activities? Yes.    Do you have any questions about your discharge instructions: Diet   No. Medications  No. Follow up visit  No.  Do you have questions or concerns about your Care? No.  Actions: * If pain score is 4 or above: No action needed, pain <4.

## 2015-07-26 ENCOUNTER — Encounter: Payer: Self-pay | Admitting: Gastroenterology

## 2015-10-05 DIAGNOSIS — C61 Malignant neoplasm of prostate: Secondary | ICD-10-CM | POA: Diagnosis not present

## 2015-10-15 DIAGNOSIS — C61 Malignant neoplasm of prostate: Secondary | ICD-10-CM | POA: Diagnosis not present

## 2015-10-15 DIAGNOSIS — Z85820 Personal history of malignant melanoma of skin: Secondary | ICD-10-CM | POA: Diagnosis not present

## 2015-10-15 DIAGNOSIS — Z1283 Encounter for screening for malignant neoplasm of skin: Secondary | ICD-10-CM | POA: Diagnosis not present

## 2015-10-15 DIAGNOSIS — R1084 Generalized abdominal pain: Secondary | ICD-10-CM | POA: Diagnosis not present

## 2015-10-15 DIAGNOSIS — L821 Other seborrheic keratosis: Secondary | ICD-10-CM | POA: Diagnosis not present

## 2015-10-15 DIAGNOSIS — Z08 Encounter for follow-up examination after completed treatment for malignant neoplasm: Secondary | ICD-10-CM | POA: Diagnosis not present

## 2015-10-30 DIAGNOSIS — H25811 Combined forms of age-related cataract, right eye: Secondary | ICD-10-CM | POA: Diagnosis not present

## 2015-10-30 DIAGNOSIS — H401131 Primary open-angle glaucoma, bilateral, mild stage: Secondary | ICD-10-CM | POA: Diagnosis not present

## 2015-10-30 DIAGNOSIS — H25812 Combined forms of age-related cataract, left eye: Secondary | ICD-10-CM | POA: Diagnosis not present

## 2015-11-06 ENCOUNTER — Encounter: Payer: Self-pay | Admitting: Family Medicine

## 2015-11-06 ENCOUNTER — Ambulatory Visit (INDEPENDENT_AMBULATORY_CARE_PROVIDER_SITE_OTHER): Payer: Medicare Other | Admitting: Family Medicine

## 2015-11-06 VITALS — BP 138/84 | HR 52 | Temp 98.4°F | Ht 66.0 in | Wt 203.0 lb

## 2015-11-06 DIAGNOSIS — C61 Malignant neoplasm of prostate: Secondary | ICD-10-CM

## 2015-11-06 DIAGNOSIS — Z23 Encounter for immunization: Secondary | ICD-10-CM

## 2015-11-06 DIAGNOSIS — R739 Hyperglycemia, unspecified: Secondary | ICD-10-CM

## 2015-11-06 DIAGNOSIS — N289 Disorder of kidney and ureter, unspecified: Secondary | ICD-10-CM

## 2015-11-06 DIAGNOSIS — E785 Hyperlipidemia, unspecified: Secondary | ICD-10-CM

## 2015-11-06 DIAGNOSIS — I10 Essential (primary) hypertension: Secondary | ICD-10-CM | POA: Diagnosis not present

## 2015-11-06 LAB — CBC WITH DIFFERENTIAL/PLATELET
BASOS PCT: 0.8 % (ref 0.0–3.0)
Basophils Absolute: 0.1 10*3/uL (ref 0.0–0.1)
EOS PCT: 1.9 % (ref 0.0–5.0)
Eosinophils Absolute: 0.1 10*3/uL (ref 0.0–0.7)
HEMATOCRIT: 43.5 % (ref 39.0–52.0)
HEMOGLOBIN: 14.8 g/dL (ref 13.0–17.0)
Lymphocytes Relative: 26.6 % (ref 12.0–46.0)
Lymphs Abs: 2.1 10*3/uL (ref 0.7–4.0)
MCHC: 33.9 g/dL (ref 30.0–36.0)
MCV: 88.9 fl (ref 78.0–100.0)
MONO ABS: 0.6 10*3/uL (ref 0.1–1.0)
MONOS PCT: 8.1 % (ref 3.0–12.0)
Neutro Abs: 5 10*3/uL (ref 1.4–7.7)
Neutrophils Relative %: 62.6 % (ref 43.0–77.0)
Platelets: 218 10*3/uL (ref 150.0–400.0)
RBC: 4.9 Mil/uL (ref 4.22–5.81)
RDW: 14 % (ref 11.5–15.5)
WBC: 7.9 10*3/uL (ref 4.0–10.5)

## 2015-11-06 LAB — TSH: TSH: 2.94 u[IU]/mL (ref 0.35–4.50)

## 2015-11-06 LAB — BASIC METABOLIC PANEL
BUN: 15 mg/dL (ref 6–23)
CHLORIDE: 106 meq/L (ref 96–112)
CO2: 28 meq/L (ref 19–32)
Calcium: 9.3 mg/dL (ref 8.4–10.5)
Creatinine, Ser: 1.33 mg/dL (ref 0.40–1.50)
GFR: 56.83 mL/min — ABNORMAL LOW (ref >60.00)
Glucose, Bld: 126 mg/dL — ABNORMAL HIGH (ref 70–99)
POTASSIUM: 4.3 meq/L (ref 3.5–5.1)
Sodium: 141 mEq/L (ref 135–145)

## 2015-11-06 LAB — LIPID PANEL
CHOL/HDL RATIO: 4
Cholesterol: 187 mg/dL (ref 0–200)
HDL: 47.8 mg/dL (ref >39.00)
LDL Cholesterol: 115 mg/dL — ABNORMAL HIGH (ref 0–99)
NONHDL: 139.48
TRIGLYCERIDES: 124 mg/dL (ref 0.0–149.0)
VLDL: 24.8 mg/dL (ref 0.0–40.0)

## 2015-11-06 LAB — HEPATIC FUNCTION PANEL
ALK PHOS: 40 U/L (ref 39–117)
ALT: 16 U/L (ref 0–53)
AST: 16 U/L (ref 0–37)
Albumin: 4.8 g/dL (ref 3.5–5.2)
BILIRUBIN TOTAL: 0.5 mg/dL (ref 0.2–1.2)
Bilirubin, Direct: 0.1 mg/dL (ref 0.0–0.3)
Total Protein: 7 g/dL (ref 6.0–8.3)

## 2015-11-06 LAB — PSA: PSA: 0.38 ng/mL (ref 0.10–4.00)

## 2015-11-06 LAB — HEMOGLOBIN A1C: HEMOGLOBIN A1C: 6.2 % (ref 4.6–6.5)

## 2015-11-06 NOTE — Progress Notes (Signed)
   Subjective:    Patient ID: [name] [name]., male    DOB: 1945/11/14, 70 y.o.   MRN: [id number]  HPI 70 yr old male to follow up several issues. He still sees Urology every 6 months to follow his prostate cancer. His PSA remains elevated and his most recent level was 0.23. A bone scan last year was negative. His BP is stable. He see [name] for glaucoma and he is scheduled to have a stent placed so he can stop using drops. He will a cataract removed soon also. He feels great otherwise.    Review of Systems  Constitutional: Negative.   HENT: Negative.   Eyes: Negative.   Respiratory: Negative.   Cardiovascular: Negative.   Gastrointestinal: Negative.   Genitourinary: Negative.   Musculoskeletal: Negative.   Skin: Negative.   Neurological: Negative.   Psychiatric/Behavioral: Negative.        Objective:   Physical Exam  Constitutional: He is oriented to person, place, and time. He appears well-developed and well-nourished. No distress.  HENT:  Head: Normocephalic and atraumatic.  Right Ear: External ear normal.  Left Ear: External ear normal.  Nose: Nose normal.  Mouth/Throat: Oropharynx is clear and moist. No oropharyngeal exudate.  Eyes: Conjunctivae and EOM are normal. Pupils are equal, round, and reactive to light. Right eye exhibits no discharge. Left eye exhibits no discharge. No scleral icterus.  Neck: Neck supple. No JVD present. No tracheal deviation present. No thyromegaly present.  Cardiovascular: Normal rate, regular rhythm, normal heart sounds and intact distal pulses.  Exam reveals no gallop and no friction rub.   No murmur heard. EKG normal   Pulmonary/Chest: Effort normal and breath sounds normal. No respiratory distress. He has no wheezes. He has no rales. He exhibits no tenderness.  Abdominal: Soft. Bowel sounds are normal. He exhibits no distension and no mass. There is no tenderness. There is no rebound and no guarding.  Musculoskeletal: Normal range of  motion. He exhibits no edema or tenderness.  Lymphadenopathy:    He has no cervical adenopathy.  Neurological: He is alert and oriented to person, place, and time. He has normal reflexes. No cranial nerve deficit. He exhibits normal muscle tone. Coordination normal.  Skin: Skin is warm and dry. No rash noted. He is not diaphoretic. No erythema. No pallor.  Psychiatric: He has a normal mood and affect. His behavior is normal. Judgment and thought content normal.          Assessment & Plan:  HTN is stable. Check fasting labs including an A1c for his hx of elevated glucoses. Check a PSA for the prostate cancer. Check lipids.  [name], MD

## 2015-11-06 NOTE — Progress Notes (Signed)
Pre visit review using our clinic review tool, if applicable. No additional management support is needed unless otherwise documented below in the visit note. 

## 2015-11-10 LAB — POC URINALSYSI DIPSTICK (AUTOMATED)
Bilirubin, UA: NEGATIVE
GLUCOSE UA: NEGATIVE
Ketones, UA: NEGATIVE
Leukocytes, UA: NEGATIVE
NITRITE UA: NEGATIVE
PROTEIN UA: NEGATIVE
RBC UA: NEGATIVE
SPEC GRAV UA: 1.025
UROBILINOGEN UA: 0.2
pH, UA: 5.5

## 2015-11-23 DIAGNOSIS — H401121 Primary open-angle glaucoma, left eye, mild stage: Secondary | ICD-10-CM | POA: Diagnosis not present

## 2015-11-23 DIAGNOSIS — H25812 Combined forms of age-related cataract, left eye: Secondary | ICD-10-CM | POA: Diagnosis not present

## 2015-11-23 DIAGNOSIS — H2512 Age-related nuclear cataract, left eye: Secondary | ICD-10-CM | POA: Diagnosis not present

## 2015-11-23 DIAGNOSIS — H409 Unspecified glaucoma: Secondary | ICD-10-CM | POA: Diagnosis not present

## 2016-01-18 DIAGNOSIS — Z85820 Personal history of malignant melanoma of skin: Secondary | ICD-10-CM | POA: Diagnosis not present

## 2016-01-18 DIAGNOSIS — Z08 Encounter for follow-up examination after completed treatment for malignant neoplasm: Secondary | ICD-10-CM | POA: Diagnosis not present

## 2016-01-18 DIAGNOSIS — L820 Inflamed seborrheic keratosis: Secondary | ICD-10-CM | POA: Diagnosis not present

## 2016-01-18 DIAGNOSIS — L821 Other seborrheic keratosis: Secondary | ICD-10-CM | POA: Diagnosis not present

## 2016-01-18 DIAGNOSIS — Z85828 Personal history of other malignant neoplasm of skin: Secondary | ICD-10-CM | POA: Diagnosis not present

## 2016-01-18 DIAGNOSIS — Z1283 Encounter for screening for malignant neoplasm of skin: Secondary | ICD-10-CM | POA: Diagnosis not present

## 2016-04-13 DIAGNOSIS — Z8546 Personal history of malignant neoplasm of prostate: Secondary | ICD-10-CM | POA: Diagnosis not present

## 2016-04-18 DIAGNOSIS — D485 Neoplasm of uncertain behavior of skin: Secondary | ICD-10-CM | POA: Diagnosis not present

## 2016-04-18 DIAGNOSIS — Z08 Encounter for follow-up examination after completed treatment for malignant neoplasm: Secondary | ICD-10-CM | POA: Diagnosis not present

## 2016-04-18 DIAGNOSIS — Z1283 Encounter for screening for malignant neoplasm of skin: Secondary | ICD-10-CM | POA: Diagnosis not present

## 2016-04-18 DIAGNOSIS — D225 Melanocytic nevi of trunk: Secondary | ICD-10-CM | POA: Diagnosis not present

## 2016-04-18 DIAGNOSIS — Z85820 Personal history of malignant melanoma of skin: Secondary | ICD-10-CM | POA: Diagnosis not present

## 2016-04-20 ENCOUNTER — Other Ambulatory Visit: Payer: Self-pay | Admitting: Urology

## 2016-04-20 DIAGNOSIS — C61 Malignant neoplasm of prostate: Secondary | ICD-10-CM | POA: Diagnosis not present

## 2016-04-20 DIAGNOSIS — R21 Rash and other nonspecific skin eruption: Secondary | ICD-10-CM | POA: Diagnosis not present

## 2016-04-20 DIAGNOSIS — N393 Stress incontinence (female) (male): Secondary | ICD-10-CM | POA: Diagnosis not present

## 2016-04-20 DIAGNOSIS — N5201 Erectile dysfunction due to arterial insufficiency: Secondary | ICD-10-CM | POA: Diagnosis not present

## 2016-04-27 DIAGNOSIS — Z961 Presence of intraocular lens: Secondary | ICD-10-CM | POA: Diagnosis not present

## 2016-04-27 DIAGNOSIS — H401131 Primary open-angle glaucoma, bilateral, mild stage: Secondary | ICD-10-CM | POA: Diagnosis not present

## 2016-04-27 DIAGNOSIS — H25811 Combined forms of age-related cataract, right eye: Secondary | ICD-10-CM | POA: Diagnosis not present

## 2016-05-05 ENCOUNTER — Ambulatory Visit: Payer: Medicare Other

## 2016-05-05 ENCOUNTER — Ambulatory Visit (INDEPENDENT_AMBULATORY_CARE_PROVIDER_SITE_OTHER): Payer: Medicare Other | Admitting: Family Medicine

## 2016-05-05 ENCOUNTER — Encounter: Payer: Self-pay | Admitting: Family Medicine

## 2016-05-05 VITALS — BP 150/90 | HR 66 | Temp 98.1°F | Ht 66.0 in | Wt 198.0 lb

## 2016-05-05 DIAGNOSIS — N289 Disorder of kidney and ureter, unspecified: Secondary | ICD-10-CM

## 2016-05-05 DIAGNOSIS — R739 Hyperglycemia, unspecified: Secondary | ICD-10-CM | POA: Diagnosis not present

## 2016-05-05 DIAGNOSIS — Z87891 Personal history of nicotine dependence: Secondary | ICD-10-CM | POA: Diagnosis not present

## 2016-05-05 DIAGNOSIS — I10 Essential (primary) hypertension: Secondary | ICD-10-CM

## 2016-05-05 DIAGNOSIS — E782 Mixed hyperlipidemia: Secondary | ICD-10-CM

## 2016-05-05 LAB — LIPID PANEL
Cholesterol: 127 mg/dL (ref 0–200)
HDL: 41.7 mg/dL (ref >39.00)
LDL Cholesterol: 64 mg/dL (ref 0–99)
NONHDL: 85.46
Total CHOL/HDL Ratio: 3
Triglycerides: 107 mg/dL (ref 0.0–149.0)
VLDL: 21.4 mg/dL (ref 0.0–40.0)

## 2016-05-05 LAB — BASIC METABOLIC PANEL
BUN: 22 mg/dL (ref 6–23)
CALCIUM: 9.7 mg/dL (ref 8.4–10.5)
CO2: 26 meq/L (ref 19–32)
Chloride: 107 mEq/L (ref 96–112)
Creatinine, Ser: 1.33 mg/dL (ref 0.40–1.50)
GFR: 56.74 mL/min — AB (ref >60.00)
Glucose, Bld: 88 mg/dL (ref 70–99)
Potassium: 4.4 mEq/L (ref 3.5–5.1)
SODIUM: 140 meq/L (ref 135–145)

## 2016-05-05 LAB — HEPATIC FUNCTION PANEL
ALBUMIN: 4.7 g/dL (ref 3.5–5.2)
ALT: 25 U/L (ref 0–53)
AST: 22 U/L (ref 0–37)
Alkaline Phosphatase: 40 U/L (ref 39–117)
Bilirubin, Direct: 0.1 mg/dL (ref 0.0–0.3)
Total Bilirubin: 0.4 mg/dL (ref 0.2–1.2)
Total Protein: 7 g/dL (ref 6.0–8.3)

## 2016-05-05 LAB — HEMOGLOBIN A1C: Hgb A1c MFr Bld: 6 % (ref 4.6–6.5)

## 2016-05-05 NOTE — Progress Notes (Signed)
Pre visit review using our clinic review tool, if applicable. No additional management support is needed unless otherwise documented below in the visit note. 

## 2016-05-05 NOTE — Patient Instructions (Addendum)
**Coming March 12th**  [facility]!!!  Same Day Appointments for Acute Care: Sprains, Injuries, cuts, abrasions Colds, flu, sore throats, cough, upset stomachs Fever, ear pain Sinus and eye infections Animal/insect bites  3 Easy Ways to Schedule: Walk-In Scheduling Call in scheduling Mychart Sign-up: [email or web address]   Mr. Totino , Thank you for taking time to come for your Medicare Wellness Visit. I appreciate your ongoing commitment to your health goals. Please review the following plan we discussed and let me know if I can assist you in the future.    [facility] to see if the they have [facility] a "Microalbumin" which is a ua test for protein Manuela Schwartz will take the Hep c out as the [place] has completed   Manuela Schwartz will call [name] for neg report for diabetic Retinopathy   Will completed the Advanced Directive Will try to complete AD; Given copy  Referred to [facility] for questions [place] offers free advance directive forms, as well as assistance in completing the forms themselves. For assistance, contact the Spiritual Care Department at [phone], or the Clinical Social Work Department at [phone].   Educated regarding prediabetes and numbers;  A1c ranges from 5.8 to 6.5 or fasting Blood sugar > 115 -126; (126 is diabetic)   Risk: >45yo; family hx; overweight or obese; African American; Hispanic; Latino; American [place] American; [place]; history of diabetes when pregnant; or birth to a baby weighing over 9 lbs. Being less physically active than 30 minutes; 3 times a week;   Prevention; Losing a modest 7 to 8 lbs; If over 200 lbs; 10 to 14 lbs;  Choose healthier foods; colorful veggies; fish or lean meats; drinks water Reduce portion size Start exercising; 30 minutes of fast walking x 30 minutes per day/ 60 min for weight loss     These are the goals we discussed: Goals    . Weight (lb) < 175 lb (79.4 kg)        Keep going on your   Check out  online nutrition programs as GumSearch.nl and http://vang.com/; fit66me; Look for foods with "whole" wheat; bran; oatmeal etc Shot at the farmer's markets in season for fresher choices  Watch for "hydrogenated" on the label of oils which are trans-fats.  Watch for "high fructose corn syrup" in snacks, yogurt or ketchup  Meats have less marbling; bright colored fruits and vegetables;  Canned; dump out liquid and wash vegetables. Be mindful of what we are eating  Portion control is essential to a health weight! Sit down; take a break and enjoy your meal; take smaller bites; put the fork down between bites;  It takes 20 minutes to get full; so check in with your fullness cues and stop eating when you start to fill full              This is a list of the screening recommended for you and due dates:  Health Maintenance  Topic Date Due  .  Hepatitis C: One time screening is recommended by Center for Disease Control  (CDC) for  adults born from [birth year] through 1965.   [birth year]  . Complete foot exam   69/24/1957  . Eye exam for diabetics  [birth year]1959  . Urine Protein Check  69/24[birth year]1959  . Hemoglobin A1C  05/05/2016  . Tetanus Vaccine  05/09/2016  . Colon Cancer Screening  07/19/2025  . Flu Shot  Completed  . Pneumonia vaccines  Completed     Health Maintenance, Male  A healthy lifestyle and preventive care is important for your health and wellness. Ask your health care provider about what schedule of regular examinations is right for you. What should I know about weight and diet?  Eat a Healthy Diet  Eat plenty of vegetables, fruits, whole grains, low-fat dairy products, and lean protein.  Do not eat a lot of foods high in solid fats, added sugars, or salt. Maintain a Healthy Weight  Regular exercise can help you achieve or maintain a healthy weight. You should:  Do at least 150 minutes of exercise each week. The exercise should increase  your heart rate and make you sweat (moderate-intensity exercise).  Do strength-training exercises at least twice a week. Watch Your Levels of Cholesterol and Blood Lipids  Have your blood tested for lipids and cholesterol every 5 years starting at 69 years of age. If you are at high risk for heart disease, you should start having your blood tested when you are 69 years old. You may need to have your cholesterol levels checked more often if:  Your lipid or cholesterol levels are high.  You are older than 69 years of age.  You are at high risk for heart disease. What should I know about cancer screening? Many types of cancers can be detected early and may often be prevented. Lung Cancer  You should be screened every year for lung cancer if:  You are a current smoker who has smoked for at least 30 years.  You are a former smoker who has quit within the past 15 years.  Talk to your health care provider about your screening options, when you should start screening, and how often you should be screened. Colorectal Cancer  Routine colorectal cancer screening usually begins at 69 years of age and should be repeated every 5-10 years until you are 69 years old. You may need to be screened more often if early forms of precancerous polyps or small growths are found. Your health care provider may recommend screening at an earlier age if you have risk factors for colon cancer.  Your health care provider may recommend using home test kits to check for hidden blood in the stool.  A small camera at the end of a tube can be used to examine your colon (sigmoidoscopy or colonoscopy). This checks for the earliest forms of colorectal cancer. Prostate and Testicular Cancer  Depending on your age and overall health, your health care provider may do certain tests to screen for prostate and testicular cancer.  Talk to your health care provider about any symptoms or concerns you have about testicular or  prostate cancer. Skin Cancer  Check your skin from head to toe regularly.  Tell your health care provider about any new moles or changes in moles, especially if:  There is a change in a mole's size, shape, or color.  You have a mole that is larger than a pencil eraser.  Always use sunscreen. Apply sunscreen liberally and repeat throughout the day.  Protect yourself by wearing long sleeves, pants, a wide-brimmed hat, and sunglasses when outside. What should I know about heart disease, diabetes, and high blood pressure?  If you are 71-71 years of age, have your blood pressure checked every 3-5 years. If you are 47 years of age or older, have your blood pressure checked every year. You should have your blood pressure measured twice-once when you are at a hospital or clinic, and once when you are not at a hospital or  clinic. Record the average of the two measurements. To check your blood pressure when you are not at a hospital or clinic, you can use:  An automated blood pressure machine at a pharmacy.  A home blood pressure monitor.  Talk to your health care provider about your target blood pressure.  If you are between 20-8 years old, ask your health care provider if you should take aspirin to prevent heart disease.  Have regular diabetes screenings by checking your fasting blood sugar level.  If you are at a normal weight and have a low risk for diabetes, have this test once every three years after the age of 59.  If you are overweight and have a high risk for diabetes, consider being tested at a younger age or more often.  A one-time screening for abdominal aortic aneurysm (AAA) by ultrasound is recommended for men aged 52-75 years who are current or former smokers. What should I know about preventing infection? Hepatitis B  If you have a higher risk for hepatitis B, you should be screened for this virus. Talk with your health care provider to find out if you are at risk for  hepatitis B infection. Hepatitis C  Blood testing is recommended for:  Everyone born from [birth year] through 1965.  Anyone with known risk factors for hepatitis C. Sexually Transmitted Diseases (STDs)  You should be screened each year for STDs including gonorrhea and chlamydia if:  You are sexually active and are younger than 69 years of age.  You are older than 69 years of age and your health care provider tells you that you are at risk for this type of infection.  Your sexual activity has changed since you were last screened and you are at an increased risk for chlamydia or gonorrhea. Ask your health care provider if you are at risk.  Talk with your health care provider about whether you are at high risk of being infected with HIV. Your health care provider may recommend a prescription medicine to help prevent HIV infection. What else can I do?  Schedule regular health, dental, and eye exams.  Stay current with your vaccines (immunizations).  Do not use any tobacco products, such as cigarettes, chewing tobacco, and e-cigarettes. If you need help quitting, ask your health care provider.  Limit alcohol intake to no more than 2 drinks per day. One drink equals 12 ounces of beer, 5 ounces of wine, or 1 ounces of hard liquor.  Do not use street drugs.  Do not share needles.  Ask your health care provider for help if you need support or information about quitting drugs.  Tell your health care provider if you often feel depressed.  Tell your health care provider if you have ever been abused or do not feel safe at home. This information is not intended to replace advice given to you by your health care provider. Make sure you discuss any questions you have with your health care provider. Document Released: 08/20/2007 Document Revised: 10/21/2015 Document Reviewed: 11/25/2014 Elsevier Interactive Patient Education  2017 [place] Prevention in the Home Falls can cause  injuries and can affect people from all age groups. There are many simple things that you can do to make your home safe and to help prevent falls. What can I do on the outside of my home?  Regularly repair the edges of walkways and driveways and fix any cracks.  Remove high doorway thresholds.  Trim any shrubbery on  the main path into your home.  Use bright outdoor lighting.  Clear walkways of debris and clutter, including tools and rocks.  Regularly check that handrails are securely fastened and in good repair. Both sides of any steps should have handrails.  Install guardrails along the edges of any raised decks or porches.  Have leaves, snow, and ice cleared regularly.  Use sand or salt on walkways during winter months.  In the garage, clean up any spills right away, including grease or oil spills. What can I do in the bathroom?  Use night lights.  Install grab bars by the toilet and in the tub and shower. Do not use towel bars as grab bars.  Use non-skid mats or decals on the floor of the tub or shower.  If you need to sit down while you are in the shower, use a plastic, non-slip stool.  Keep the floor dry. Immediately clean up any water that spills on the floor.  Remove soap buildup in the tub or shower on a regular basis.  Attach bath mats securely with double-sided non-slip rug tape.  Remove throw rugs and other tripping hazards from the floor. What can I do in the bedroom?  Use night lights.  Make sure that a bedside light is easy to reach.  Do not use oversized bedding that drapes onto the floor.  Have a firm chair that has side arms to use for getting dressed.  Remove throw rugs and other tripping hazards from the floor. What can I do in the kitchen?  Clean up any spills right away.  Avoid walking on wet floors.  Place frequently used items in easy-to-reach places.  If you need to reach for something above you, use a sturdy step stool that has a grab  bar.  Keep electrical cables out of the way.  Do not use floor polish or wax that makes floors slippery. If you have to use wax, make sure that it is non-skid floor wax.  Remove throw rugs and other tripping hazards from the floor. What can I do in the stairways?  Do not leave any items on the stairs.  Make sure that there are handrails on both sides of the stairs. Fix handrails that are broken or loose. Make sure that handrails are as long as the stairways.  Check any carpeting to make sure that it is firmly attached to the stairs. Fix any carpet that is loose or worn.  Avoid having throw rugs at the top or bottom of stairways, or secure the rugs with carpet tape to prevent them from moving.  Make sure that you have a light switch at the top of the stairs and the bottom of the stairs. If you do not have them, have them installed. What are some other fall prevention tips?  Wear closed-toe shoes that fit well and support your feet. Wear shoes that have rubber soles or low heels.  When you use a stepladder, make sure that it is completely opened and that the sides are firmly locked. Have someone hold the ladder while you are using it. Do not climb a closed stepladder.  Add color or contrast paint or tape to grab bars and handrails in your home. Place contrasting color strips on the first and last steps.  Use mobility aids as needed, such as canes, walkers, scooters, and crutches.  Turn on lights if it is dark. Replace any light bulbs that burn out.  Set up furniture so that there are  clear paths. Keep the furniture in the same spot.  Fix any uneven floor surfaces.  Choose a carpet design that does not hide the edge of steps of a stairway.  Be aware of any and all pets.  Review your medicines with your healthcare provider. Some medicines can cause dizziness or changes in blood pressure, which increase your risk of falling. Talk with your health care provider about other ways that  you can decrease your risk of falls. This may include working with a physical therapist or trainer to improve your strength, balance, and endurance. This information is not intended to replace advice given to you by your health care provider. Make sure you discuss any questions you have with your health care provider. Document Released: 02/11/2002 Document Revised: 07/21/2015 Document Reviewed: 03/28/2014 Elsevier Interactive Patient Education  2017 [employer].

## 2016-05-05 NOTE — Progress Notes (Addendum)
Subjective:   [name] [name]. is a 71 y.o. male who presents for an Initial Medicare Annual Wellness Visit.  Cardiac Risk Factors include: advanced age (>67men, >51 women);male gender   The Patient was informed that the wellness visit is to identify future health risk and educate and initiate measures that can reduce risk for increased disease through the lifespan.    NO ROS; Medicare Wellness Visit Last OV:  Today  Labs completed: today  Describes health as fair, good or great? goodf  Does have prostate cancer and being treated;  Cancer doctor told him to stop sugar;  Will have pet scan sch March 6th ; PSA is doubling every year    Update:  Tobacco: no or smoking cessation discussed:  Did smoke a pack a day for 9 years  Agreed to AAA screen and will order due to smoking over 100 cigarettes in his lifetime   How many drinks do you have per week? Rare   Medications no issues  BMI: 32   Diet;   Is trying to lose weight; On the Atkins diet; reducing carbs Wants to lose 21 lbs Breakfast; 2 scrambled eggs, bacon bread Lunch; most of the time goes out to eat Eats a steak; broccoli  Dinner; wife cooks;  Plans to go back on the [place] good   Heart healthy? Fried foods? Sodium: Cholesterol Nutritional counseling given:  Teeth or Denture issues?   Exercise; Swim at [facility] 3 time a week; x 45 minutes Walks on treadmill one mile on the days he does not swim   Safety features reviewed for safe community; firearms if in the home; smoke alarms; sun protection when outside; driving difficulties or accidents no issues  See dermatology for melanoma assessments  Mental Health:  Any emotional problems? Anxious, depressed, irritable, sad or blue? No Denies feeling depressed or hopeless; voices pleasure in daily life How many social activities have you been engaged in within the last 2 weeks? No Who would help you with chores; illness; shopping other?    Cognitive;    Manages checkbook, medications; no failures of task Ad8 score reviewed for issues;  Issues making decisions; no  Less interest in hobbies / activities" no  Repeats questions, stories; family complaining: NO  Trouble using ordinary gadgets; microwave; computer: no  Forgets the month or year: no  Mismanaging finances: no  Missing apt: no but does write them down  Daily problems with thinking of memory NO Ad8 score is 0   Mobilization and Functional losses from last year to this year? no   Hearing Screening Comments: Has hearing aid from the [place]  States they ware working good  Vision Screening Comments: Inserted stent in left eye for Glaucoma Hoping he will not need to use drops [name] 18 in os and 21 od Stay off all drops for 6 months   Gets all meds from the [place] for free  Going [place] Directive addressed; did agree to take information for him and his wife; Will bring back once completed    Health Maintenance Due  Topic Date Due  . Hepatitis C Screening  1945/05/10  . FOOT EXAM  10/28/1957  . OPHTHALMOLOGY EXAM  10/28/1957  . URINE MICROALBUMIN  10/28/1957  . HEMOGLOBIN A1C  05/05/2016    [facility]; self reported completed at the [facility] and will take out   Foot pain and bunion surgery; [name] checked foot  Will postpone  x 1 year  Eye exam 11/23/2015 and will check with [name] for Diabetic Retinopathy  Colonoscopy; 07/2015 will repeat in 10 years 07/2025  [name] is following PSA  Dr. [place] Crane in [place] has found melanoma in his back x 4 Goes q 3 months after melanoma after bx; going 6 months currently   Feels like the [facility] did microalbumin; will check prior to repeat   Patient Care Team: [name], MD as PCP - General [name]  Dr. Nevada Crane dermatology    Education provided and lifestyle risk discussed   All Health Maintenance Gaps Reviewed for closure       Objective:    Today's Vitals   05/05/16 1026  BP:  (!) 150/90  Pulse: 66  Temp: 98.1 F (36.7 C)  Weight: 198 lb (89.8 kg)  Height: 5\' 6"  (1.676 m)   Body mass index is 31.96 kg/m.  Current Medications (verified) Outpatient Encounter Prescriptions as of 05/05/2016  Medication Sig  . aspirin EC 81 MG tablet Take 81 mg by mouth daily.  . Coenzyme Q10 (COQ-10) 100 MG CAPS Take 100 mg by mouth daily.  . Multiple Vitamins-Minerals (MULTIVITAMIN WITH MINERALS) tablet Take 1 tablet by mouth 2 (two) times a week. Sundays and wednesdays  . rosuvastatin (CRESTOR) 20 MG tablet Take 10 mg by mouth daily.  . tadalafil (CIALIS) 20 MG tablet Take 1 tablet (20 mg total) by mouth daily as needed.  . bimatoprost (LUMIGAN) 0.01 % SOLN Place 1 drop into both eyes at bedtime.  . fenofibrate 160 MG tablet Take 1 tablet (160 mg total) by mouth daily.  Marland Kitchen LEVOBUNOLOL HCL OP Apply 1 drop to eye daily.  Marland Kitchen lisinopril (PRINIVIL,ZESTRIL) 20 MG tablet Take 1 tablet (20 mg total) by mouth daily.   No facility-administered encounter medications on file as of 05/05/2016.     Allergies (verified) Patient has no known allergies.   History: Past Medical History:  Diagnosis Date  . ABNORMAL EKG 10/07/2008  . ADVEF, DRUG/MED/BIOL SUBST, OTHER DRUG NOS 09/26/2006  . Allergy    seasonal  . Cataract    left eye-surgery 07-2015  . DEGENERATIVE JOINT DISEASE, MODERATE 04/13/2007  . Diabetes mellitus    type 2, diet controlled   . DIVERTICULOSIS, COLON 04/13/2007  . ELEVATED PROSTATE SPECIFIC ANTIGEN 04/14/2007  . Glaucoma    sees [name]   . GOUT 05/16/2007  . HYPERLIPIDEMIA 09/26/2006  . HYPERTENSION 04/13/2007  . JOINT EFFUSION, KNEE 06/15/2009  . Melanoma [name] [name])    sees [name]   . PROSTATE CANCER 10/05/2009   sees [name]  . Renal insufficiency    RESOLVED PER PATIENT   Past Surgical History:  Procedure Laterality Date  . ACROMIOPLASTY Right 05-06-14   per [name]   . BUNIONECTOMY     bilateral  . CATARACT EXTRACTION Left    per [name]   .  COLONOSCOPY  07/10/2015   per [name], benign polyp, repeat in 10 yrs   . CYSTECTOMY     benign from hand  . GLAUCOMA SURGERY Bilateral 2014   . INSERTION OF MESH N/A 01/10/2013   Procedure: INSERTION OF MESH;  Surgeon: Earnstine Regal, MD;  Location: [place];  Service: General;  Laterality: N/A;  . KNEE ARTHROSCOPY Right Jan. 2013   right knee, per [name]   . KNEE ARTHROSCOPY Left 01-14-15   per [name]   . MELANOMA EXCISION  2012   per [name]  .  PROSTATECTOMY     per [name]  . TONSILLECTOMY    . UMBILICAL HERNIA REPAIR  11/01/2011   Procedure: HERNIA REPAIR UMBILICAL ADULT;  Surgeon: Earnstine Regal, MD;  Location: [place];  Service: General;  Laterality: N/A;  Repair umbilical hernia with mesh patch  . VASECTOMY    . VENTRAL HERNIA REPAIR  01/10/2013   with mesh     [name]  . VENTRAL HERNIA REPAIR N/A 01/10/2013   Procedure: HERNIA REPAIR VENTRAL ADULT ;  Surgeon: Earnstine Regal, MD;  Location[facility];  Service: General;  Laterality: N/A;   Family History  Problem Relation Age of Onset  . Alcohol abuse Mother   . Stroke Mother   . Stroke Father   . Colon cancer Neg Hx   . Colon polyps Neg Hx   . Esophageal cancer Neg Hx   . Rectal cancer Neg Hx   . Stomach cancer Neg Hx    Social History   Occupational History  . Not on file.   Social History Main Topics  . Smoking status: Former Smoker    Packs/day: 1.00    Years: 9.00    Start date: 03/08/1963    Quit date: 03/07/1972  . Smokeless tobacco: Never Used  . Alcohol use No     Comment: rare now   . Drug use: No  . Sexual activity: Not on file   Tobacco Counseling Counseling given: Yes   Activities of Daily Living In your present state of health, do you have any difficulty performing the following activities: 05/05/2016  Hearing? N  Vision? N  Difficulty concentrating or making decisions? N  Walking or climbing stairs? N  Dressing or bathing? N  Doing errands, shopping? N   Preparing Food and eating ? N  Using the Toilet? N  In the past six months, have you accidently leaked urine? N  Do you have problems with loss of bowel control? N  Managing your Medications? N  Managing your Finances? N  Housekeeping or managing your Housekeeping? N  Some recent data might be hidden    Immunizations and Health Maintenance Immunization History  Administered Date(s) Administered  . Influenza Split 04/14/2011  . Influenza Whole 02/18/2009  . Influenza, High Dose Seasonal PF 01/01/2013, 11/06/2014, 11/06/2015  . Influenza,inj,Quad PF,36+ Mos 10/29/2013  . Pneumococcal Conjugate-13 06/26/2013  . Pneumococcal Polysaccharide-23 05/05/2015  . Td 05/10/2006  . Zoster 06/13/2011   Health Maintenance Due  Topic Date Due  . Hepatitis C Screening  1946/02/17  . FOOT EXAM  10/28/1957  . OPHTHALMOLOGY EXAM  10/28/1957  . URINE MICROALBUMIN  10/28/1957  . HEMOGLOBIN A1C  05/05/2016    Patient Care Team: [name], MD as PCP - General  Indicate any recent Medical Services you may have received from other than [facility] providers in the past year (date may be approximate).    Assessment:   This is a routine wellness examination for [name] [name].   Hearing/Vision screen Hearing Screening Comments: Has hearing aid from the [place]  States they ware working good  Vision Screening Comments: Inserted stent in left eye for Glaucoma Hoping he will not need to use drops [name] 18 in os and 21 od Stay off all drops for 6 months   Gets all meds from the [place] for free  Going [place]   Dietary issues and exercise activities discussed: Current Exercise Habits: Structured exercise class, Type of exercise: Other - see comments (swimming ), Time (  Minutes): 45, Frequency (Times/Week): 4, Weekly Exercise (Minutes/Week): 180, Intensity: Moderate  Goals    . Weight (lb) < 175 lb (79.4 kg)          Keep going on your   Check out  online nutrition programs as GumSearch.nl and  http://vang.com/; fit91me; Look for foods with "whole" wheat; bran; oatmeal etc Shot at the farmer's markets in season for fresher choices  Watch for "hydrogenated" on the label of oils which are trans-fats.  Watch for "high fructose corn syrup" in snacks, yogurt or ketchup  Meats have less marbling; bright colored fruits and vegetables;  Canned; dump out liquid and wash vegetables. Be mindful of what we are eating  Portion control is essential to a health weight! Sit down; take a break and enjoy your meal; take smaller bites; put the fork down between bites;  It takes 20 minutes to get full; so check in with your fullness cues and stop eating when you start to fill full             Depression Screen PHQ 2/9 Scores 05/05/2016 05/05/2016 05/06/2015 01/24/2014  PHQ - 2 Score 0 0 0 0    Fall Risk Fall Risk  05/05/2016 05/05/2016 05/06/2015 01/24/2014  Falls in the past year? No No No No    Cognitive Function:no issues Alert and oriented;         Screening Tests Health Maintenance  Topic Date Due  . Hepatitis C Screening  10-10-1945  . FOOT EXAM  10/28/1957  . OPHTHALMOLOGY EXAM  10/28/1957  . URINE MICROALBUMIN  10/28/1957  . HEMOGLOBIN A1C  05/05/2016  . TETANUS/TDAP  05/09/2016  . COLONOSCOPY  07/19/2025  . INFLUENZA VACCINE  Completed  . PNA vac Low Risk Adult  Completed        Plan:   PCP Notes  Health Maintenance Will waive hep c for [facility] Will postpone foot exam as [name] completed per the patient Manuela Schwartz to check with [name] for diabetic retinopathy check and confirm completion Agreed tot take Advanced Directive form home today to completed for he and his spouse   Abnormal Screens  Smoking hx positive for 100 cigarettes;  Agreed to AAA aneurysm check  Referrals none  Patient concerns; none Is trying to lose weight   Nurse Concerns; none  Next PCP apt was today Will see again in 6 months; Apt to be scheduled    During the course of the visit Kaelum was  educated and counseled about the following appropriate screening and preventive services:   Vaccines to include Pneumoccal, Influenza, Hepatitis B, Td, Zostavax, HCV  Electrocardiogram  Colorectal cancer screening due 2027  Cardiovascular disease screening / neg  Diabetes screening/ pre-diabetic; educated  Glaucoma screening/ had surgery; off drops x 6 months   Nutrition counseling/ trying to lose weight;   Prostate cancer screening followed by [name]  Smoking cessation counseling- 9 pack hx;   Patient Instructions (the written plan) were given to the patient.   [id number], RN   05/05/2016    I have read this note and agree with its contents.  Alysia Penna, MD

## 2016-05-05 NOTE — Progress Notes (Signed)
Subjective:   [name] [name]. is a 71 y.o. male who presents for an Initial Medicare Annual Wellness Visit.  The Patient was informed that the wellness visit is to identify future health risk and educate and initiate measures that can reduce risk for increased disease through the lifespan.    NO ROS; Medicare Wellness Visit Last OV 11/2015  And seen [name] today Medical hx Prostate cancer in 2011 Melanoma x4 followed by dermatology  Pre diabetes   Describes health as good, fair or great? Good   Preventive Screening -Counseling & Management  Colonoscopy 07/2015 due 07/2025 PSA annual followed by Dr. Roni Bread  Smoking history - former smoker; quit 79  Also educated regarding AAA for male tobacco users with smoking hx - agreed to AAA  Smokeless tobacco no Second Hand Smoke status; No Smokers in the home- no ETOH -no or rare  Medication adherence or issues? no  RISK FACTORS Diet Regular exercise   Cardiac Risk Factors:  Advanced aged > 16 in men; >65 in women Hyperlipidemia - cho 187; HDL 47; LDL 115 and Trig 124  Diabetes - A1c 6.2 in 11/2015; glucose 126  Family History Etoh abuse, stroke; father had a stroke Obesity  Fall risk  Given education on "Fall Prevention in the Home" for more safety tips the patient can apply as appropriate.  Long term goal is to "age in place" or undecided   Mobility of Functional changes this year? Safety; community, wears sunscreen, safe place for firearms; Motor vehicle accidents;   Mental Health:  Any emotional problems? Anxious, depressed, irritable, sad or blue?  Denies feeling depressed or hopeless; voices pleasure in daily life How many social activities have you been engaged in within the last 2 weeks? Who would help you with chores; illness; shopping other?  Eye exam - [name]   Activities of Daily Living - See functional screen   Cognitive testing; Ad8 score; 0 or less than 2  MMSE deferred or completed if AD8 + 2  issues  Advanced Directives   List the name of Physicians or other Practitioners you currently use:   Immunization History  Administered Date(s) Administered  . Influenza Split 04/14/2011  . Influenza Whole 02/18/2009  . Influenza, High Dose Seasonal PF 01/01/2013, 11/06/2014, 11/06/2015  . Influenza,inj,Quad PF,36+ Mos 10/29/2013  . Pneumococcal Conjugate-13 06/26/2013  . Pneumococcal Polysaccharide-23 05/05/2015  . Td 05/10/2006  . Zoster 06/13/2011   Required Immunizations needed today  Screening test up to date or reviewed for plan of completion Health Maintenance Due  Topic Date Due  . Hepatitis C Screening  1945/07/04  . FOOT EXAM  10/28/1957  . OPHTHALMOLOGY EXAM  10/28/1957  . URINE MICROALBUMIN  10/28/1957  . HEMOGLOBIN A1C  05/05/2016          Objective:    There were no vitals filed for this visit. There is no height or weight on file to calculate BMI.   Current Medications (verified) Outpatient Encounter Prescriptions as of 05/05/2016  Medication Sig  . aspirin EC 81 MG tablet Take 81 mg by mouth daily.  . bimatoprost (LUMIGAN) 0.01 % SOLN Place 1 drop into both eyes at bedtime.  . Coenzyme Q10 (COQ-10) 100 MG CAPS Take 100 mg by mouth daily.  . fenofibrate 160 MG tablet Take 1 tablet (160 mg total) by mouth daily.  Marland Kitchen LEVOBUNOLOL HCL OP Apply 1 drop to eye daily.  Marland Kitchen lisinopril (PRINIVIL,ZESTRIL) 20 MG tablet Take 1 tablet (20 mg total)  by mouth daily.  . Multiple Vitamins-Minerals (MULTIVITAMIN WITH MINERALS) tablet Take 1 tablet by mouth 2 (two) times a week. Sundays and wednesdays  . rosuvastatin (CRESTOR) 20 MG tablet Take 10 mg by mouth daily.  . tadalafil (CIALIS) 20 MG tablet Take 1 tablet (20 mg total) by mouth daily as needed.   No facility-administered encounter medications on file as of 05/05/2016.     Allergies (verified) Patient has no known allergies.   History: Past Medical History:  Diagnosis Date  . ABNORMAL EKG 10/07/2008  . ADVEF,  DRUG/MED/BIOL SUBST, OTHER DRUG NOS 09/26/2006  . Allergy    seasonal  . Cataract    left eye-surgery 07-2015  . DEGENERATIVE JOINT DISEASE, MODERATE 04/13/2007  . Diabetes mellitus    type 2, diet controlled   . DIVERTICULOSIS, COLON 04/13/2007  . ELEVATED PROSTATE SPECIFIC ANTIGEN 04/14/2007  . Glaucoma    sees Dr. Venetia Maxon  at [facility]   . GOUT 05/16/2007  . HYPERLIPIDEMIA 09/26/2006  . HYPERTENSION 04/13/2007  . JOINT EFFUSION, KNEE 06/15/2009  . Melanoma [facility])    sees [name]   . PROSTATE CANCER 10/05/2009   sees [name]  . Renal insufficiency    RESOLVED PER PATIENT   Past Surgical History:  Procedure Laterality Date  . ACROMIOPLASTY Right 05-06-14   per [name]   . BUNIONECTOMY     bilateral  . COLONOSCOPY  07/10/2015   per [name], benign polyp, repeat in 10 yrs   . CYSTECTOMY     benign from hand  . GLAUCOMA SURGERY Bilateral 2014   . INSERTION OF MESH N/A 01/10/2013   Procedure: INSERTION OF MESH;  Surgeon: Earnstine Regal, MD;  Location: [place];  Service: General;  Laterality: N/A;  . KNEE ARTHROSCOPY Right Jan. 2013   right knee, per [name]   . KNEE ARTHROSCOPY Left 01-14-15   per [name]   . MELANOMA EXCISION  2012   per [name]  . PROSTATECTOMY     per [name]  . TONSILLECTOMY    . UMBILICAL HERNIA REPAIR  11/01/2011   Procedure: HERNIA REPAIR UMBILICAL ADULT;  Surgeon: Earnstine Regal, MD;  Location: [place];  Service: General;  Laterality: N/A;  Repair umbilical hernia with mesh patch  . VASECTOMY    . VENTRAL HERNIA REPAIR  01/10/2013   with mesh     [name]  . VENTRAL HERNIA REPAIR N/A 01/10/2013   Procedure: HERNIA REPAIR VENTRAL ADULT ;  Surgeon: Earnstine Regal, MD;  Location: [facility] OR;  Service: General;  Laterality: N/A;   Family History  Problem Relation Age of Onset  . Alcohol abuse Mother   . Stroke Mother   . Stroke Father   . Colon cancer Neg Hx   . Colon polyps Neg Hx   . Esophageal cancer Neg Hx   . Rectal  cancer Neg Hx   . Stomach cancer Neg Hx    Social History   Occupational History  . Not on file.   Social History Main Topics  . Smoking status: Former Smoker    Quit date: 03/07/1972  . Smokeless tobacco: Never Used  . Alcohol use No     Comment: rare  . Drug use: No  . Sexual activity: Not on file    Tobacco Counseling Counseling given: Not Answered   Activities of Daily Living No flowsheet data found.  Immunizations and Health Maintenance Immunization History  Administered Date(s)  Administered  . Influenza Split 04/14/2011  . Influenza Whole 02/18/2009  . Influenza, High Dose Seasonal PF 01/01/2013, 11/06/2014, 11/06/2015  . Influenza,inj,Quad PF,36+ Mos 10/29/2013  . Pneumococcal Conjugate-13 06/26/2013  . Pneumococcal Polysaccharide-23 05/05/2015  . Td 05/10/2006  . Zoster 06/13/2011   Health Maintenance Due  Topic Date Due  . Hepatitis C Screening  01-Jul-1945  . FOOT EXAM  10/28/1957  . OPHTHALMOLOGY EXAM  10/28/1957  . URINE MICROALBUMIN  10/28/1957  . HEMOGLOBIN A1C  05/05/2016    Patient Care Team: [name], MD as PCP - General  Indicate any recent Medical Services you may have received from other than [facility] providers in the past year (date may be approximate).     Assessment:   This is a routine wellness examination for [name] [name].   Hearing/Vision screen No exam data present  Dietary issues and exercise activities discussed:    Goals    None     Depression Screen PHQ 2/9 Scores 05/06/2015 01/24/2014  PHQ - 2 Score 0 0    Fall Risk Fall Risk  05/06/2015 01/24/2014  Falls in the past year? No No    Cognitive Function:        Screening Tests Health Maintenance  Topic Date Due  . Hepatitis C Screening  1945-08-15  . FOOT EXAM  10/28/1957  . OPHTHALMOLOGY EXAM  10/28/1957  . URINE MICROALBUMIN  10/28/1957  . HEMOGLOBIN A1C  05/05/2016  . TETANUS/TDAP  05/09/2016  . COLONOSCOPY  07/19/2025  . INFLUENZA VACCINE  Completed  . PNA  vac Low Risk Adult  Completed      Plan:    During the course of the visit, [name] [name] was educated and counseled about the following appropriate screening and preventive services:   Vaccines to include Pneumoccal, Influenza, Hepatitis B, Td, Zostavax, HCV  Electrocardiogram  Cardiovascular disease screening  Colorectal cancer screening  Diabetes screening  Glaucoma screening  Nutrition counseling  Smoking cessation counseling  Patient Instructions (the written plan) were given to the patient.    Wynetta Fines, RN   05/05/2016

## 2016-05-05 NOTE — Progress Notes (Signed)
   Subjective:    Patient ID: [name] Hall., male    DOB: 1945-05-11, 71 y.o.   MRN: [id number]  HPI Here to follow up. He feels great. His BP at home is stable in the 120s over 70s. Unfortunately his PSA has been doubling every 6 months, and the last one was 0.58. [name] has set him up for a PET scan next week.    Review of Systems  Constitutional: Negative.   Respiratory: Negative.   Cardiovascular: Negative.   Neurological: Negative.        Objective:   Physical Exam  Constitutional: He is oriented to person, place, and time. He appears well-developed and well-nourished.  Cardiovascular: Normal rate, regular rhythm, normal heart sounds and intact distal pulses.   Pulmonary/Chest: Effort normal and breath sounds normal.  Neurological: He is alert and oriented to person, place, and time.          Assessment & Plan:  His HTN is stable. We will check his glucose management, his renal function, and his lipids today since he is fasting.  Alysia Penna, MD

## 2016-05-06 ENCOUNTER — Telehealth: Payer: Self-pay

## 2016-05-06 NOTE — Telephone Encounter (Signed)
Call to [name] office. Checking to see if eyes checked for diabetic retinopathy Will fax over last report

## 2016-05-09 ENCOUNTER — Encounter: Payer: Self-pay | Admitting: Family Medicine

## 2016-05-10 ENCOUNTER — Encounter (HOSPITAL_COMMUNITY)
Admission: RE | Admit: 2016-05-10 | Discharge: 2016-05-10 | Disposition: A | Discharge status: Home / Self Care | Payer: Medicare Other | Source: Ambulatory Visit | Attending: Urology | Admitting: Urology

## 2016-05-10 DIAGNOSIS — C61 Malignant neoplasm of prostate: Secondary | ICD-10-CM

## 2016-05-10 DIAGNOSIS — R9721 Rising PSA following treatment for malignant neoplasm of prostate: Secondary | ICD-10-CM | POA: Diagnosis not present

## 2016-05-10 NOTE — Telephone Encounter (Signed)
[name] office to fax over eye exam 03/2015 checked for DM retinopathy;  Last eye exam 02/12/2016

## 2016-05-11 NOTE — Telephone Encounter (Signed)
Requested fax eye report again [phone]

## 2016-05-26 ENCOUNTER — Ambulatory Visit (HOSPITAL_COMMUNITY)
Admission: RE | Admit: 2016-05-26 | Discharge: 2016-05-26 | Disposition: A | Discharge status: Home / Self Care | Payer: Medicare Other | Source: Ambulatory Visit | Attending: Family Medicine | Admitting: Family Medicine

## 2016-05-26 DIAGNOSIS — Z136 Encounter for screening for cardiovascular disorders: Secondary | ICD-10-CM | POA: Diagnosis not present

## 2016-05-26 DIAGNOSIS — Z87891 Personal history of nicotine dependence: Secondary | ICD-10-CM | POA: Insufficient documentation

## 2016-05-30 ENCOUNTER — Encounter: Payer: Self-pay | Admitting: Family Medicine

## 2016-05-30 ENCOUNTER — Telehealth: Payer: Self-pay | Admitting: Family Medicine

## 2016-05-30 NOTE — Telephone Encounter (Signed)
[name] [name] pt returning your call not sure of the nature of the call.

## 2016-05-30 NOTE — Telephone Encounter (Signed)
I spoke with pt and went over results. 

## 2016-05-31 ENCOUNTER — Telehealth: Payer: Self-pay

## 2016-05-31 NOTE — Telephone Encounter (Signed)
Called patient back and explained his Lab results, patient verbalized understanding.

## 2016-05-31 NOTE — Telephone Encounter (Signed)
Call back to Mr. Swayze, Asked about microalbumin and per [name] this is not necessary and will postpone this lab and educated as to why. Did have eye exam; the most recent 2/21 2018 [name] Last eye exam per [name] office for DM check 11/2015 and will postpone to the later date  Had hep c at the [place].   Wynetta Fines RN

## 2016-06-02 DIAGNOSIS — L304 Erythema intertrigo: Secondary | ICD-10-CM | POA: Diagnosis not present

## 2016-06-02 DIAGNOSIS — C61 Malignant neoplasm of prostate: Secondary | ICD-10-CM | POA: Diagnosis not present

## 2016-10-25 DIAGNOSIS — Z961 Presence of intraocular lens: Secondary | ICD-10-CM | POA: Diagnosis not present

## 2016-10-25 DIAGNOSIS — H401131 Primary open-angle glaucoma, bilateral, mild stage: Secondary | ICD-10-CM | POA: Diagnosis not present

## 2016-10-25 DIAGNOSIS — H26492 Other secondary cataract, left eye: Secondary | ICD-10-CM | POA: Diagnosis not present

## 2016-10-25 DIAGNOSIS — H25811 Combined forms of age-related cataract, right eye: Secondary | ICD-10-CM | POA: Diagnosis not present

## 2016-11-08 ENCOUNTER — Ambulatory Visit (INDEPENDENT_AMBULATORY_CARE_PROVIDER_SITE_OTHER): Payer: Medicare Other | Admitting: Family Medicine

## 2016-11-08 ENCOUNTER — Encounter: Payer: Self-pay | Admitting: Family Medicine

## 2016-11-08 VITALS — BP 138/79 | HR 56 | Temp 98.5°F | Ht 66.0 in | Wt 180.0 lb

## 2016-11-08 DIAGNOSIS — C61 Malignant neoplasm of prostate: Secondary | ICD-10-CM | POA: Diagnosis not present

## 2016-11-08 DIAGNOSIS — E782 Mixed hyperlipidemia: Secondary | ICD-10-CM | POA: Diagnosis not present

## 2016-11-08 DIAGNOSIS — I10 Essential (primary) hypertension: Secondary | ICD-10-CM

## 2016-11-08 DIAGNOSIS — Z23 Encounter for immunization: Secondary | ICD-10-CM | POA: Diagnosis not present

## 2016-11-08 DIAGNOSIS — R739 Hyperglycemia, unspecified: Secondary | ICD-10-CM

## 2016-11-08 DIAGNOSIS — N289 Disorder of kidney and ureter, unspecified: Secondary | ICD-10-CM | POA: Diagnosis not present

## 2016-11-08 LAB — POC URINALSYSI DIPSTICK (AUTOMATED)
BILIRUBIN UA: NEGATIVE
Clarity, UA: NEGATIVE
Glucose, UA: NEGATIVE
KETONES UA: NEGATIVE
LEUKOCYTES UA: NEGATIVE
Nitrite, UA: NEGATIVE
PH UA: 6 (ref 5.0–8.0)
Protein, UA: NEGATIVE
RBC UA: NEGATIVE
Spec Grav, UA: >=1.03 — AB (ref 1.010–1.025)
Urobilinogen, UA: 0.2 E.U./dL

## 2016-11-08 LAB — CBC WITH DIFFERENTIAL/PLATELET
BASOS ABS: 0.1 10*3/uL (ref 0.0–0.1)
Basophils Relative: 0.9 % (ref 0.0–3.0)
Eosinophils Absolute: 0.1 10*3/uL (ref 0.0–0.7)
Eosinophils Relative: 1.4 % (ref 0.0–5.0)
HCT: 44 % (ref 39.0–52.0)
Hemoglobin: 14.6 g/dL (ref 13.0–17.0)
LYMPHS ABS: 1.6 10*3/uL (ref 0.7–4.0)
Lymphocytes Relative: 22.3 % (ref 12.0–46.0)
MCHC: 33.2 g/dL (ref 30.0–36.0)
MCV: 90.4 fl (ref 78.0–100.0)
MONOS PCT: 8.9 % (ref 3.0–12.0)
Monocytes Absolute: 0.6 10*3/uL (ref 0.1–1.0)
NEUTROS ABS: 4.8 10*3/uL (ref 1.4–7.7)
NEUTROS PCT: 66.5 % (ref 43.0–77.0)
PLATELETS: 200 10*3/uL (ref 150.0–400.0)
RBC: 4.86 Mil/uL (ref 4.22–5.81)
RDW: 14.3 % (ref 11.5–15.5)
WBC: 7.2 10*3/uL (ref 4.0–10.5)

## 2016-11-08 LAB — LIPID PANEL
CHOLESTEROL: 135 mg/dL (ref 0–200)
HDL: 50.3 mg/dL (ref >39.00)
LDL Cholesterol: 67 mg/dL (ref 0–99)
NonHDL: 84.53
TRIGLYCERIDES: 88 mg/dL (ref 0.0–149.0)
Total CHOL/HDL Ratio: 3
VLDL: 17.6 mg/dL (ref 0.0–40.0)

## 2016-11-08 LAB — BASIC METABOLIC PANEL
BUN: 17 mg/dL (ref 6–23)
CHLORIDE: 107 meq/L (ref 96–112)
CO2: 27 mEq/L (ref 19–32)
Calcium: 9.5 mg/dL (ref 8.4–10.5)
Creatinine, Ser: 1.33 mg/dL (ref 0.40–1.50)
GFR: 56.66 mL/min — ABNORMAL LOW (ref >60.00)
GLUCOSE: 81 mg/dL (ref 70–99)
Potassium: 4.5 mEq/L (ref 3.5–5.1)
Sodium: 142 mEq/L (ref 135–145)

## 2016-11-08 LAB — HEPATIC FUNCTION PANEL
ALBUMIN: 4.5 g/dL (ref 3.5–5.2)
ALT: 16 U/L (ref 0–53)
AST: 18 U/L (ref 0–37)
Alkaline Phosphatase: 46 U/L (ref 39–117)
Bilirubin, Direct: 0.1 mg/dL (ref 0.0–0.3)
Total Bilirubin: 0.5 mg/dL (ref 0.2–1.2)
Total Protein: 6.5 g/dL (ref 6.0–8.3)

## 2016-11-08 LAB — HEMOGLOBIN A1C: Hgb A1c MFr Bld: 5.7 % (ref 4.6–6.5)

## 2016-11-08 LAB — TSH: TSH: 2.17 u[IU]/mL (ref 0.35–4.50)

## 2016-11-08 NOTE — Progress Notes (Signed)
   Subjective:    Patient ID: [name] [name]., male    DOB: [birth year], 71 y.o.   MRN: [id number]  HPI Here to follow up on issues. He feels great. He has lost some weight. He just spent the weekend scuba diving with his family. He sees [name] every 6 months to follow a slowly rising PSA. He had a PET scan in March that was clear. He sees the [place] [facility] twice a year.    Review of Systems  Constitutional: Negative.   HENT: Negative.   Eyes: Negative.   Respiratory: Negative.   Cardiovascular: Negative.   Gastrointestinal: Negative.   Genitourinary: Negative.   Musculoskeletal: Negative.   Skin: Negative.   Neurological: Negative.   Psychiatric/Behavioral: Negative.        Objective:   Physical Exam  Constitutional: He is oriented to person, place, and time. He appears well-developed and well-nourished. No distress.  HENT:  Head: Normocephalic and atraumatic.  Right Ear: External ear normal.  Left Ear: External ear normal.  Nose: Nose normal.  Mouth/Throat: Oropharynx is clear and moist. No oropharyngeal exudate.  Eyes: Pupils are equal, round, and reactive to light. Conjunctivae and EOM are normal. Right eye exhibits no discharge. Left eye exhibits no discharge. No scleral icterus.  Neck: Neck supple. No JVD present. No tracheal deviation present. No thyromegaly present.  Cardiovascular: Normal rate, regular rhythm, normal heart sounds and intact distal pulses.  Exam reveals no gallop and no friction rub.   No murmur heard. Pulmonary/Chest: Effort normal and breath sounds normal. No respiratory distress. He has no wheezes. He has no rales. He exhibits no tenderness.  Abdominal: Soft. Bowel sounds are normal. He exhibits no distension and no mass. There is no tenderness. There is no rebound and no guarding.  Musculoskeletal: Normal range of motion. He exhibits no edema or tenderness.  Lymphadenopathy:    He has no cervical adenopathy.  Neurological: He is alert and oriented  to person, place, and time. He has normal reflexes. No cranial nerve deficit. He exhibits normal muscle tone. Coordination normal.  Skin: Skin is warm and dry. No rash noted. He is not diaphoretic. No erythema. No pallor.  Psychiatric: He has a normal mood and affect. His behavior is normal. Judgment and thought content normal.          Assessment & Plan:  He is doing well. He will follow up with [name] for the elevated PSA. His HTN is stable. We will get fasting labs for glucose and lipids, etc.  Alysia Penna, MD

## 2016-11-08 NOTE — Patient Instructions (Signed)
WE NOW OFFER   [facility] FAST TRACK!!!  SAME DAY Appointments for ACUTE CARE  Such as: Sprains, Injuries, cuts, abrasions, rashes, muscle pain, joint pain, back pain Colds, flu, sore throats, headache, allergies, cough, fever  Ear pain, sinus and eye infections Abdominal pain, nausea, vomiting, diarrhea, upset stomach Animal/insect bites  3 Easy Ways to Schedule: Walk-In Scheduling Call in scheduling Mychart Sign-up: [email or web address]         

## 2016-11-24 ENCOUNTER — Encounter: Payer: Self-pay | Admitting: Family Medicine

## 2016-12-01 DIAGNOSIS — C61 Malignant neoplasm of prostate: Secondary | ICD-10-CM | POA: Diagnosis not present

## 2016-12-05 DIAGNOSIS — Z85820 Personal history of malignant melanoma of skin: Secondary | ICD-10-CM | POA: Diagnosis not present

## 2016-12-05 DIAGNOSIS — Z08 Encounter for follow-up examination after completed treatment for malignant neoplasm: Secondary | ICD-10-CM | POA: Diagnosis not present

## 2016-12-05 DIAGNOSIS — Z1283 Encounter for screening for malignant neoplasm of skin: Secondary | ICD-10-CM | POA: Diagnosis not present

## 2016-12-06 DIAGNOSIS — H401131 Primary open-angle glaucoma, bilateral, mild stage: Secondary | ICD-10-CM | POA: Diagnosis not present

## 2016-12-07 DIAGNOSIS — N5201 Erectile dysfunction due to arterial insufficiency: Secondary | ICD-10-CM | POA: Diagnosis not present

## 2016-12-07 DIAGNOSIS — N393 Stress incontinence (female) (male): Secondary | ICD-10-CM | POA: Diagnosis not present

## 2016-12-07 DIAGNOSIS — C61 Malignant neoplasm of prostate: Secondary | ICD-10-CM | POA: Diagnosis not present

## 2017-04-27 DIAGNOSIS — H26492 Other secondary cataract, left eye: Secondary | ICD-10-CM | POA: Diagnosis not present

## 2017-04-27 DIAGNOSIS — Z961 Presence of intraocular lens: Secondary | ICD-10-CM | POA: Diagnosis not present

## 2017-04-27 DIAGNOSIS — H25811 Combined forms of age-related cataract, right eye: Secondary | ICD-10-CM | POA: Diagnosis not present

## 2017-04-27 DIAGNOSIS — H40053 Ocular hypertension, bilateral: Secondary | ICD-10-CM | POA: Diagnosis not present

## 2017-05-08 ENCOUNTER — Ambulatory Visit: Payer: Medicare Other

## 2017-05-09 ENCOUNTER — Encounter: Payer: Self-pay | Admitting: Family Medicine

## 2017-05-09 ENCOUNTER — Ambulatory Visit (INDEPENDENT_AMBULATORY_CARE_PROVIDER_SITE_OTHER): Payer: Medicare Other

## 2017-05-09 ENCOUNTER — Ambulatory Visit (INDEPENDENT_AMBULATORY_CARE_PROVIDER_SITE_OTHER): Payer: Medicare Other | Admitting: Family Medicine

## 2017-05-09 VITALS — BP 135/70 | HR 56 | Ht 66.0 in | Wt 194.0 lb

## 2017-05-09 VITALS — BP 136/72 | HR 56 | Temp 97.9°F | Wt 194.0 lb

## 2017-05-09 DIAGNOSIS — I10 Essential (primary) hypertension: Secondary | ICD-10-CM

## 2017-05-09 DIAGNOSIS — Z0000 Encounter for general adult medical examination without abnormal findings: Secondary | ICD-10-CM | POA: Diagnosis not present

## 2017-05-09 DIAGNOSIS — N289 Disorder of kidney and ureter, unspecified: Secondary | ICD-10-CM | POA: Diagnosis not present

## 2017-05-09 DIAGNOSIS — E782 Mixed hyperlipidemia: Secondary | ICD-10-CM

## 2017-05-09 DIAGNOSIS — R739 Hyperglycemia, unspecified: Secondary | ICD-10-CM | POA: Diagnosis not present

## 2017-05-09 LAB — HEPATIC FUNCTION PANEL
ALK PHOS: 45 U/L (ref 39–117)
ALT: 15 U/L (ref 0–53)
AST: 16 U/L (ref 0–37)
Albumin: 4.5 g/dL (ref 3.5–5.2)
BILIRUBIN TOTAL: 0.6 mg/dL (ref 0.2–1.2)
Bilirubin, Direct: 0.1 mg/dL (ref 0.0–0.3)
Total Protein: 6.8 g/dL (ref 6.0–8.3)

## 2017-05-09 LAB — BASIC METABOLIC PANEL
BUN: 23 mg/dL (ref 6–23)
CALCIUM: 9.6 mg/dL (ref 8.4–10.5)
CHLORIDE: 106 meq/L (ref 96–112)
CO2: 29 meq/L (ref 19–32)
CREATININE: 1.33 mg/dL (ref 0.40–1.50)
GFR: 56.58 mL/min — ABNORMAL LOW (ref >60.00)
Glucose, Bld: 85 mg/dL (ref 70–99)
Potassium: 4.5 mEq/L (ref 3.5–5.1)
Sodium: 141 mEq/L (ref 135–145)

## 2017-05-09 LAB — HEMOGLOBIN A1C: Hgb A1c MFr Bld: 5.9 % (ref 4.6–6.5)

## 2017-05-09 LAB — LIPID PANEL
CHOL/HDL RATIO: 3
Cholesterol: 132 mg/dL (ref 0–200)
HDL: 45 mg/dL (ref >39.00)
LDL CALC: 71 mg/dL (ref 0–99)
NONHDL: 86.85
Triglycerides: 80 mg/dL (ref 0.0–149.0)
VLDL: 16 mg/dL (ref 0.0–40.0)

## 2017-05-09 NOTE — Progress Notes (Signed)
   Subjective:    Patient ID: [name] Komar., male    DOB: 08/19/1945, 72 y.o.   MRN: [id number]  HPI Here to follow up. He is fasting for labs. He feels great. His BP is stable.    Review of Systems  Constitutional: Negative.   Respiratory: Negative.   Cardiovascular: Negative.   Neurological: Negative.        Objective:   Physical Exam  Constitutional: He is oriented to person, place, and time. He appears well-developed and well-nourished.  Neck: Neck supple. No thyromegaly present.  Cardiovascular: Normal rate, regular rhythm, normal heart sounds and intact distal pulses.  Pulmonary/Chest: Effort normal and breath sounds normal. No respiratory distress. He has no wheezes. He has no rales.  Musculoskeletal: He exhibits no edema.  Lymphadenopathy:    He has no cervical adenopathy.  Neurological: He is alert and oriented to person, place, and time.          Assessment & Plan:  His HTN is stable. He will get fasting labs today to check lipids, A1c,and renal function. Alysia Penna, MD

## 2017-05-09 NOTE — Patient Instructions (Addendum)
[name] [name] , Thank you for taking time to come for your Medicare Wellness Visit. I appreciate your ongoing commitment to your health goals. Please review the following plan we discussed and let me know if I can assist you in the future.   A Tetanus is recommended every 10 years. Medicare covers a tetanus if you have a cut or wound; otherwise, there may be a charge. If you had not had a tetanus with pertusses, known as the Tdap, you can take this anytime.  Will go to the [place] to get your tetanus with pertussis   Shingrix is a vaccine for the prevention of Shingles in Adults 50 and older.  If you are on Medicare, you can request a prescription from your doctor to be filled at a pharmacy.  Please check with your benefits regarding applicable copays or out of pocket expenses.  The Shingrix is given in 2 vaccines approx 8 weeks apart. You must receive the 2nd dose prior to 6 months from receipt of the first.    These are the goals we discussed: same goal this year  Goals    . Weight (lb) < 175 lb (79.4 kg)     Keep going on your   Check out  online nutrition programs as GumSearch.nl and http://vang.com/; fit53me; Look for foods with "whole" wheat; bran; oatmeal etc Shot at the farmer's markets in season for fresher choices  Watch for "hydrogenated" on the label of oils which are trans-fats.  Watch for "high fructose corn syrup" in snacks, yogurt or ketchup  Meats have less marbling; bright colored fruits and vegetables;  Canned; dump out liquid and wash vegetables. Be mindful of what we are eating  Portion control is essential to a health weight! Sit down; take a break and enjoy your meal; take smaller bites; put the fork down between bites;  It takes 20 minutes to get full; so check in with your fullness cues and stop eating when you start to fill full              This is a list of the screening recommended for you and due dates:  Health Maintenance  Topic Date Due  . Tetanus  Vaccine  05/09/2016  . Hemoglobin A1C  05/08/2017  . Urine Protein Check  05/31/2017*  . Complete foot exam   05/06/2018*  . Eye exam for diabetics  04/27/2018  . Colon Cancer Screening  07/19/2025  . Flu Shot  Completed  . Pneumonia vaccines  Completed  *Topic was postponed. The date shown is not the original due date.   Fall Prevention in the Home Falls can cause injuries and can affect people from all age groups. There are many simple things that you can do to make your home safe and to help prevent falls. What can I do on the outside of my home?  Regularly repair the edges of walkways and driveways and fix any cracks.  Remove high doorway thresholds.  Trim any shrubbery on the main path into your home.  Use bright outdoor lighting.  Clear walkways of debris and clutter, including tools and rocks.  Regularly check that handrails are securely fastened and in good repair. Both sides of any steps should have handrails.  Install guardrails along the edges of any raised decks or porches.  Have leaves, snow, and ice cleared regularly.  Use sand or salt on walkways during winter months.  In the garage, clean up any spills right away, including grease or oil  spills. What can I do in the bathroom?  Use night lights.  Install grab bars by the toilet and in the tub and shower. Do not use towel bars as grab bars.  Use non-skid mats or decals on the floor of the tub or shower.  If you need to sit down while you are in the shower, use a plastic, non-slip stool.  Keep the floor dry. Immediately clean up any water that spills on the floor.  Remove soap buildup in the tub or shower on a regular basis.  Attach bath mats securely with double-sided non-slip rug tape.  Remove throw rugs and other tripping hazards from the floor. What can I do in the bedroom?  Use night lights.  Make sure that a bedside light is easy to reach.  Do not use oversized bedding that drapes onto the  floor.  Have a firm chair that has side arms to use for getting dressed.  Remove throw rugs and other tripping hazards from the floor. What can I do in the kitchen?  Clean up any spills right away.  Avoid walking on wet floors.  Place frequently used items in easy-to-reach places.  If you need to reach for something above you, use a sturdy step stool that has a grab bar.  Keep electrical cables out of the way.  Do not use floor polish or wax that makes floors slippery. If you have to use wax, make sure that it is non-skid floor wax.  Remove throw rugs and other tripping hazards from the floor. What can I do in the stairways?  Do not leave any items on the stairs.  Make sure that there are handrails on both sides of the stairs. Fix handrails that are broken or loose. Make sure that handrails are as long as the stairways.  Check any carpeting to make sure that it is firmly attached to the stairs. Fix any carpet that is loose or worn.  Avoid having throw rugs at the top or bottom of stairways, or secure the rugs with carpet tape to prevent them from moving.  Make sure that you have a light switch at the top of the stairs and the bottom of the stairs. If you do not have them, have them installed. What are some other fall prevention tips?  Wear closed-toe shoes that fit well and support your feet. Wear shoes that have rubber soles or low heels.  When you use a stepladder, make sure that it is completely opened and that the sides are firmly locked. Have someone hold the ladder while you are using it. Do not climb a closed stepladder.  Add color or contrast paint or tape to grab bars and handrails in your home. Place contrasting color strips on the first and last steps.  Use mobility aids as needed, such as canes, walkers, scooters, and crutches.  Turn on lights if it is dark. Replace any light bulbs that burn out.  Set up furniture so that there are clear paths. Keep the  furniture in the same spot.  Fix any uneven floor surfaces.  Choose a carpet design that does not hide the edge of steps of a stairway.  Be aware of any and all pets.  Review your medicines with your healthcare provider. Some medicines can cause dizziness or changes in blood pressure, which increase your risk of falling. Talk with your health care provider about other ways that you can decrease your risk of falls. This may include working with  a physical therapist or trainer to improve your strength, balance, and endurance. This information is not intended to replace advice given to you by your health care provider. Make sure you discuss any questions you have with your health care provider. Document Released: 02/11/2002 Document Revised: 07/21/2015 Document Reviewed: 03/28/2014 Elsevier Interactive Patient Education  2018 [place] Maintenance, Male A healthy lifestyle and preventive care is important for your health and wellness. Ask your health care provider about what schedule of regular examinations is right for you. What should I know about weight and diet? Eat a Healthy Diet  Eat plenty of vegetables, fruits, whole grains, low-fat dairy products, and lean protein.  Do not eat a lot of foods high in solid fats, added sugars, or salt.  Maintain a Healthy Weight Regular exercise can help you achieve or maintain a healthy weight. You should:  Do at least 150 minutes of exercise each week. The exercise should increase your heart rate and make you sweat (moderate-intensity exercise).  Do strength-training exercises at least twice a week.  Watch Your Levels of Cholesterol and Blood Lipids  Have your blood tested for lipids and cholesterol every 5 years starting at 70 years of age. If you are at high risk for heart disease, you should start having your blood tested when you are 70 years old. You may need to have your cholesterol levels checked more often if: ? Your lipid  or cholesterol levels are high. ? You are older than 70 years of age. ? You are at high risk for heart disease.  What should I know about cancer screening? Many types of cancers can be detected early and may often be prevented. Lung Cancer  You should be screened every year for lung cancer if: ? You are a current smoker who has smoked for at least 30 years. ? You are a former smoker who has quit within the past 15 years.  Talk to your health care provider about your screening options, when you should start screening, and how often you should be screened.  Colorectal Cancer  Routine colorectal cancer screening usually begins at 70 years of age and should be repeated every 5-10 years until you are 70 years old. You may need to be screened more often if early forms of precancerous polyps or small growths are found. Your health care provider may recommend screening at an earlier age if you have risk factors for colon cancer.  Your health care provider may recommend using home test kits to check for hidden blood in the stool.  A small camera at the end of a tube can be used to examine your colon (sigmoidoscopy or colonoscopy). This checks for the earliest forms of colorectal cancer.  Prostate and Testicular Cancer  Depending on your age and overall health, your health care provider may do certain tests to screen for prostate and testicular cancer.  Talk to your health care provider about any symptoms or concerns you have about testicular or prostate cancer.  Skin Cancer  Check your skin from head to toe regularly.  Tell your health care provider about any new moles or changes in moles, especially if: ? There is a change in a mole's size, shape, or color. ? You have a mole that is larger than a pencil eraser.  Always use sunscreen. Apply sunscreen liberally and repeat throughout the day.  Protect yourself by wearing long sleeves, pants, a wide-brimmed hat, and sunglasses when  outside.  What should I  know about heart disease, diabetes, and high blood pressure?  If you are 80-49 years of age, have your blood pressure checked every 3-5 years. If you are 38 years of age or older, have your blood pressure checked every year. You should have your blood pressure measured twice-once when you are at a hospital or clinic, and once when you are not at a hospital or clinic. Record the average of the two measurements. To check your blood pressure when you are not at a hospital or clinic, you can use: ? An automated blood pressure machine at a pharmacy. ? A home blood pressure monitor.  Talk to your health care provider about your target blood pressure.  If you are between 42-54 years old, ask your health care provider if you should take aspirin to prevent heart disease.  Have regular diabetes screenings by checking your fasting blood sugar level. ? If you are at a normal weight and have a low risk for diabetes, have this test once every three years after the age of 80. ? If you are overweight and have a high risk for diabetes, consider being tested at a younger age or more often.  A one-time screening for abdominal aortic aneurysm (AAA) by ultrasound is recommended for men aged 65-75 years who are current or former smokers. What should I know about preventing infection? Hepatitis B If you have a higher risk for hepatitis B, you should be screened for this virus. Talk with your health care provider to find out if you are at risk for hepatitis B infection. Hepatitis C Blood testing is recommended for:  Everyone born from [birth year] through 1963.  Anyone with known risk factors for hepatitis C.  Sexually Transmitted Diseases (STDs)  You should be screened each year for STDs including gonorrhea and chlamydia if: ? You are sexually active and are younger than 70 years of age. ? You are older than 70 years of age and your health care provider tells you that you are at risk for this  type of infection. ? Your sexual activity has changed since you were last screened and you are at an increased risk for chlamydia or gonorrhea. Ask your health care provider if you are at risk.  Talk with your health care provider about whether you are at high risk of being infected with HIV. Your health care provider may recommend a prescription medicine to help prevent HIV infection.  What else can I do?  Schedule regular health, dental, and eye exams.  Stay current with your vaccines (immunizations).  Do not use any tobacco products, such as cigarettes, chewing tobacco, and e-cigarettes. If you need help quitting, ask your health care provider.  Limit alcohol intake to no more than 2 drinks per day. One drink equals 12 ounces of beer, 5 ounces of wine, or 1 ounces of hard liquor.  Do not use street drugs.  Do not share needles.  Ask your health care provider for help if you need support or information about quitting drugs.  Tell your health care provider if you often feel depressed.  Tell your health care provider if you have ever been abused or do not feel safe at home. This information is not intended to replace advice given to you by your health care provider. Make sure you discuss any questions you have with your health care provider. Document Released: 08/20/2007 Document Revised: 10/21/2015 Document Reviewed: 11/25/2014 Elsevier Interactive Patient Education  [employer].

## 2017-05-09 NOTE — Progress Notes (Addendum)
Subjective:   [name] [name]. is a 72 y.o. male who presents for Medicare Annual/Subsequent preventive examination.  Reports health as very good. Concerned that PSA is still elevated post prostate removal UA is following; Pet scan neg   Diet Eats low carb and good proteins for his pre-diabetes BMI 31.3    Exercise Swims x 3 per week  Health Maintenance Due  Topic Date Due  . TETANUS/TDAP  05/09/2016  . HEMOGLOBIN A1C  05/08/2017   Educated regarding tetanus- will take at the [place] and get the Tdap  Educated regarding  shingrix  Educated on possible prediabetes but the patient states he does watch his sugar     Labs to be drawn today  Objective:    Vitals: BP 135/70   Pulse (!) 56   Ht 5\' 6"  (1.676 m)   Wt 194 lb (88 kg)   BMI 31.31 kg/m   Body mass index is 31.31 kg/m.  Advanced Directives 05/05/2016 07/20/2015 07/06/2015 05/09/2014 01/10/2013 01/01/2013 10/24/2011  Does Patient Have a Medical Advance Directive? No No No No Patient does not have advance directive Patient would not like information;Patient does not have advance directive Patient does not have advance directive  Would patient like information on creating a medical advance directive? - No - patient declined information - - - - -   The patient and spouse are in process of getting this done   Tobacco Social History   Tobacco Use  Smoking Status Former Smoker  . Packs/day: 1.00  . Years: 9.00  . Pack years: 9.00  . Start date: 03/08/1963  . Last attempt to quit: 03/07/1972  . Years since quitting: 45.2  Smokeless Tobacco Never Used  Tobacco Comment   States AAA completed      Counseling given: Yes Comment: States AAA completed    Clinical Intake:    Past Medical History:  Diagnosis Date  . ABNORMAL EKG 10/07/2008  . ADVEF, DRUG/MED/BIOL SUBST, OTHER DRUG NOS 09/26/2006  . Allergy    seasonal  . Cataract    left eye-surgery 07-2015  . DEGENERATIVE JOINT DISEASE, MODERATE 04/13/2007  . Diabetes  mellitus    type 2, diet controlled   . DIVERTICULOSIS, COLON 04/13/2007  . ELEVATED PROSTATE SPECIFIC ANTIGEN 04/14/2007  . Glaucoma    sees [name]   . GOUT 05/16/2007  . HYPERLIPIDEMIA 09/26/2006  . HYPERTENSION 04/13/2007  . JOINT EFFUSION, KNEE 06/15/2009  . Melanoma [facility])    sees [name]   . PROSTATE CANCER 10/05/2009   sees [name]  . Renal insufficiency    RESOLVED PER PATIENT   Past Surgical History:  Procedure Laterality Date  . ACROMIOPLASTY Right 05-06-14   per [name]   . BUNIONECTOMY     bilateral  . CATARACT EXTRACTION Left    per [name]   . COLONOSCOPY  07/10/2015   per [name], benign polyp, repeat in 10 yrs   . CYSTECTOMY     benign from hand  . GLAUCOMA SURGERY Bilateral 2014   . INSERTION OF MESH N/A 01/10/2013   Procedure: INSERTION OF MESH;  Surgeon: Earnstine Regal, MD;  Location: [place];  Service: General;  Laterality: N/A;  . KNEE ARTHROSCOPY Right Jan. 2013   right knee, per [name]   . KNEE ARTHROSCOPY Left 01-14-15   per [name]   . MELANOMA EXCISION  2012   per [name]  . PROSTATECTOMY     per  [name]  . TONSILLECTOMY    . UMBILICAL HERNIA REPAIR  11/01/2011   Procedure: HERNIA REPAIR UMBILICAL ADULT;  Surgeon: Earnstine Regal, MD;  Location: [place];  Service: General;  Laterality: N/A;  Repair umbilical hernia with mesh patch  . VASECTOMY    . VENTRAL HERNIA REPAIR  01/10/2013   with mesh     [name]  . VENTRAL HERNIA REPAIR N/A 01/10/2013   Procedure: HERNIA REPAIR VENTRAL ADULT ;  Surgeon: Earnstine Regal, MD;  Location: [facility] OR;  Service: General;  Laterality: N/A;   Family History  Problem Relation Age of Onset  . Alcohol abuse Mother   . Stroke Mother   . Stroke Father   . Colon cancer Neg Hx   . Colon polyps Neg Hx   . Esophageal cancer Neg Hx   . Rectal cancer Neg Hx   . Stomach cancer Neg Hx    Social History   Socioeconomic History  . Marital status: Married    Spouse name: Not on file    . Number of children: Not on file  . Years of education: Not on file  . Highest education level: Not on file  Social Needs  . Financial resource strain: Not on file  . Food insecurity - worry: Not on file  . Food insecurity - inability: Not on file  . Transportation needs - medical: Not on file  . Transportation needs - non-medical: Not on file  Occupational History  . Not on file  Tobacco Use  . Smoking status: Former Smoker    Packs/day: 1.00    Years: 9.00    Pack years: 9.00    Start date: 03/08/1963    Last attempt to quit: 03/07/1972    Years since quitting: 45.2  . Smokeless tobacco: Never Used  . Tobacco comment: States AAA completed   Substance and Sexual Activity  . Alcohol use: No    Alcohol/week: 0.0 oz    Comment: rare now   . Drug use: No  . Sexual activity: Not on file  Other Topics Concern  . Not on file  Social History Narrative   Was in the [employer]; Office manager   Currently can go to the [employer] exposure      Wife; married 40 years   2 children; grands no     Outpatient Encounter Medications as of 05/09/2017  Medication Sig  . aspirin EC 81 MG tablet Take 81 mg by mouth daily.  . bimatoprost (LUMIGAN) 0.01 % SOLN Place 1 drop into both eyes at bedtime.  . Coenzyme Q10 (COQ-10) 100 MG CAPS Take 100 mg by mouth daily.  . DORZOLAMIDE HCL-TIMOLOL MAL OP Apply to eye.  . fenofibrate 160 MG tablet Take 1 tablet (160 mg total) by mouth daily.  Marland Kitchen lisinopril (PRINIVIL,ZESTRIL) 20 MG tablet Take 1 tablet (20 mg total) by mouth daily.  . Multiple Vitamins-Minerals (MULTIVITAMIN WITH MINERALS) tablet Take 1 tablet by mouth 2 (two) times a week. Sundays and wednesdays  . rosuvastatin (CRESTOR) 20 MG tablet Take 10 mg by mouth daily.  . tadalafil (CIALIS) 20 MG tablet Take 1 tablet (20 mg total) by mouth daily as needed.   No facility-administered encounter medications on file as of 05/09/2017.     Activities of Daily Living No flowsheet data  found.  Patient Care Team: [name], MD as PCP - General   Assessment:   This is a routine  wellness examination for [name] [name].  Exercise Activities and Dietary recommendations    Goals    . Weight (lb) < 175 lb (79.4 kg)     Keep going on your   Check out  online nutrition programs as GumSearch.nl and http://vang.com/; fit39me; Look for foods with "whole" wheat; bran; oatmeal etc Shot at the farmer's markets in season for fresher choices  Watch for "hydrogenated" on the label of oils which are trans-fats.  Watch for "high fructose corn syrup" in snacks, yogurt or ketchup  Meats have less marbling; bright colored fruits and vegetables;  Canned; dump out liquid and wash vegetables. Be mindful of what we are eating  Portion control is essential to a health weight! Sit down; take a break and enjoy your meal; take smaller bites; put the fork down between bites;  It takes 20 minutes to get full; so check in with your fullness cues and stop eating when you start to fill full              Fall Risk Fall Risk  05/09/2017 05/05/2016 05/05/2016 05/06/2015 01/24/2014  Falls in the past year? No No No No No   No falls No functional changes from last year  Depression Screen PHQ 2/9 Scores 05/09/2017 05/05/2016 05/05/2016 05/06/2015  PHQ - 2 Score 0 0 0 0   Neg  Cognitive Function MMSE - Mini Mental State Exam 05/05/2016  Not completed: (No Data)     Ad8 score reviewed for issues:  Issues making decisions:  Less interest in hobbies / activities:  Repeats questions, stories (family complaining):  Trouble using ordinary gadgets (microwave, computer, phone):  Forgets the month or year:   Mismanaging finances:   Remembering appts:  Daily problems with thinking and/or memory: Ad8 score is=0    Immunization History  Administered Date(s) Administered  . Influenza Split 04/14/2011  . Influenza Whole 02/18/2009  . Influenza, High Dose Seasonal PF 01/01/2013, 11/06/2014,  11/06/2015, 11/08/2016  . Influenza,inj,Quad PF,6+ Mos 10/29/2013  . Pneumococcal Conjugate-13 06/26/2013  . Pneumococcal Polysaccharide-23 05/05/2015  . Td 05/10/2006  . Zoster 06/13/2011     Screening Tests Health Maintenance  Topic Date Due  . TETANUS/TDAP  05/09/2016  . HEMOGLOBIN A1C  05/08/2017  . URINE MICROALBUMIN  05/31/2017 (Originally 10/28/1955)  . FOOT EXAM  05/06/2018 (Originally 10/28/1955)  . OPHTHALMOLOGY EXAM  04/27/2018  . COLONOSCOPY  07/19/2025  . INFLUENZA VACCINE  Completed  . PNA vac Low Risk Adult  Completed       Plan:      PCP Notes   Health Maintenance Will take Tdap at the [facility] to be drawn today  Educated on pre-diabetes;  Monitors his carb intake    Abnormal Screens  States his PSA is being followed by [name] in [place]  Referrals  none  Patient concerns; None  Nurse Concerns; As noted Very energetic male; feels good; Monitors his diet closely  Would like to lose a few pounds  Next PCP apt       I have personally reviewed and noted the following in the patient's chart:   . Medical and social history . Use of alcohol, tobacco or illicit drugs  . Current medications and supplements . Functional ability and status . Nutritional status . Physical activity . Advanced directives . List of other physicians . Hospitalizations, surgeries, and ER visits in previous 12 months . Vitals . Screenings to include cognitive, depression, and falls . Referrals and appointments  In addition, I have  reviewed and discussed with patient certain preventive protocols, quality metrics, and best practice recommendations. A written personalized care plan for preventive services as well as general preventive health recommendations were provided to patient.     CCQFJ,UVQQU, RN  05/09/2017  I have read this note and agree with its contents.  Alysia Penna, MD

## 2017-05-10 IMAGING — CT NM PET NOPR SKULL BASE TO THIGH
7 series · 25 of 25 positions shown · non-contrast
Comparison: Bone scan 05/24/2007

CLINICAL DATA: Prostate carcinoma with biochemical recurrence.
Rising PSA. PSA equal 0.58 on 04/23/2016. Prostatectomy and
radiation in 5551.

EXAM:
NUCLEAR MEDICINE PET SKULL BASE TO THIGH
TECHNIQUE: 10.1 mCi F-18 Fluciclovine was injected intravenously. Full-ring PET
imaging was performed from the skull base to thigh after the
radiotracer. CT data was obtained and used for attenuation
correction and anatomic localization.

[Series 3: pet sk_thigh ac · axial · 5.0mm · 4.07mm/px · z∈[+447,+1327]mm · 6 of 221 slices shown]
[im 1/221]
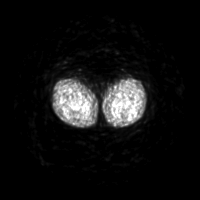
[im 45/221]
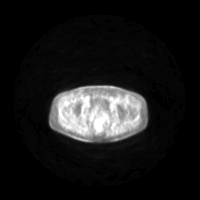
[im 89/221]
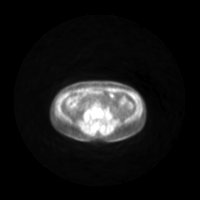
[im 133/221]
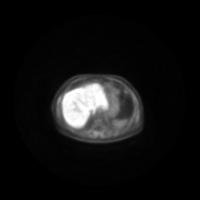
[im 177/221]
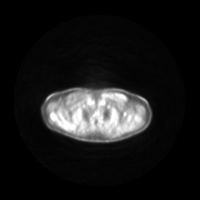
[im 221/221]
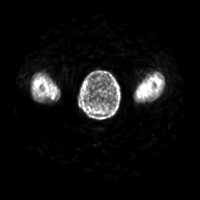

[Series 4: ct sk_thigh 5.0 b31f · axial · 5.0mm · 0.98mm/px · z∈[+447,+1327]mm · 5 of 221 slices shown]
[im 1/221]
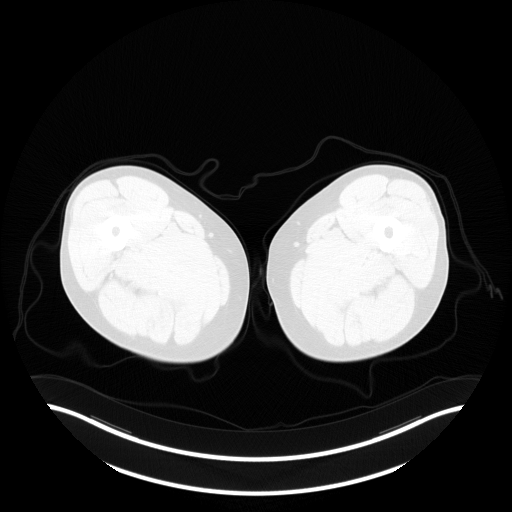
[im 56/221]
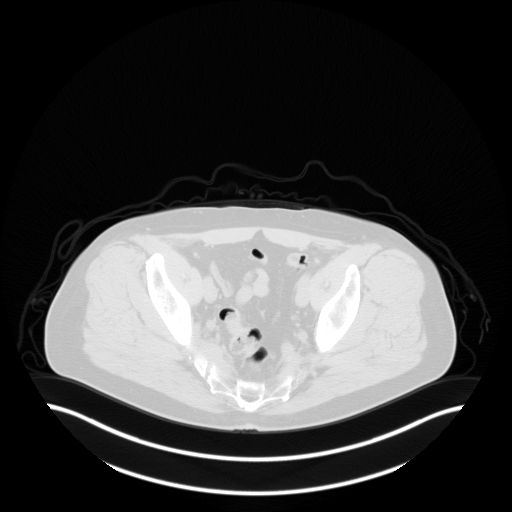
[im 111/221]
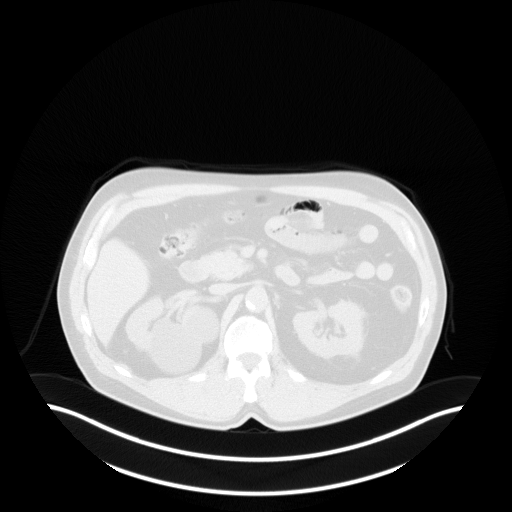
[im 166/221]
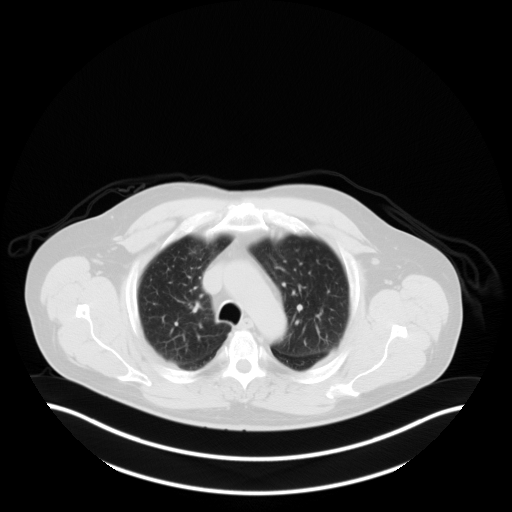
[im 221/221  brain]
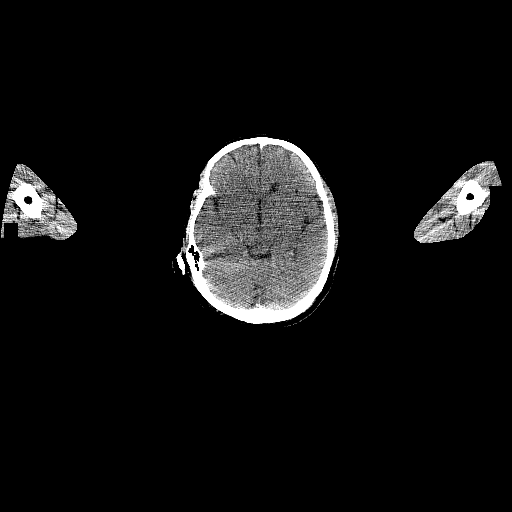

[Series 7: pet sk_thigh nac · axial · 5.0mm · 4.07mm/px · z∈[+447,+1327]mm · 5 of 221 slices shown]
[im 1/221]
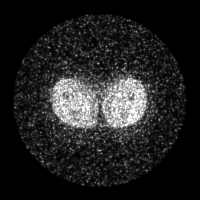
[im 56/221]
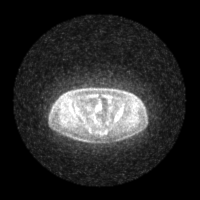
[im 111/221]
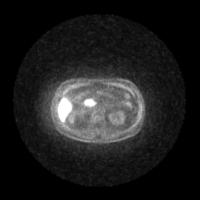
[im 166/221]
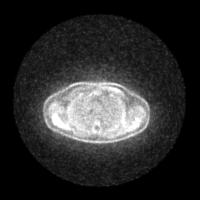
[im 221/221]
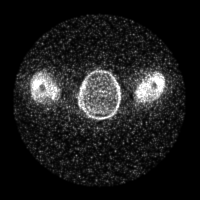

[Series 8: ct sk_thigh 5.0 b70f (id)_bone · axial · 5.0mm · 0.71mm/px · z∈[+925,+1181]mm · 2 of 65 slices shown]
[im 1/65  bone]
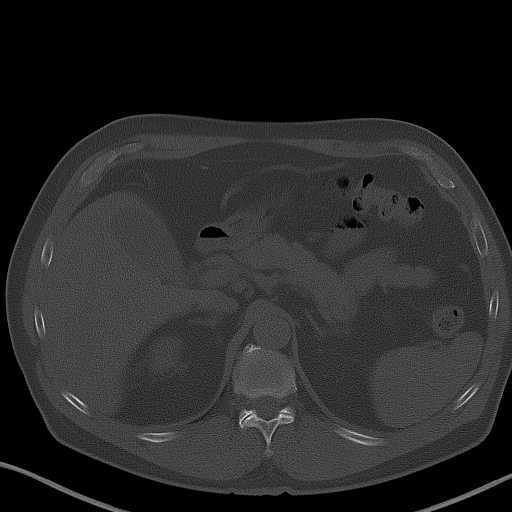
[im 65/65  bone]
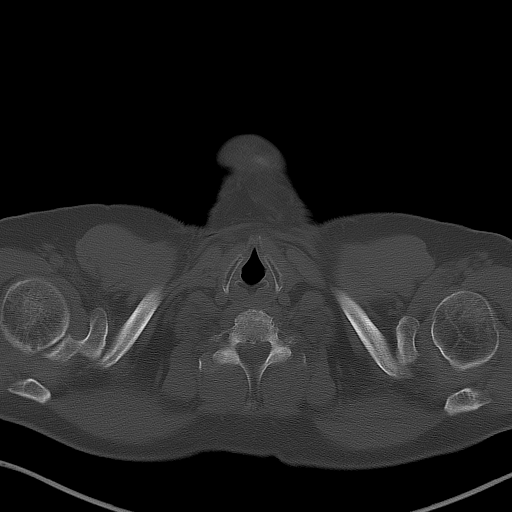

[Series 604: range-ct sk_thigh 5.0 (id)<alpha range> · 1 of 54 slices shown (1 of 2)]
[im 1/54]
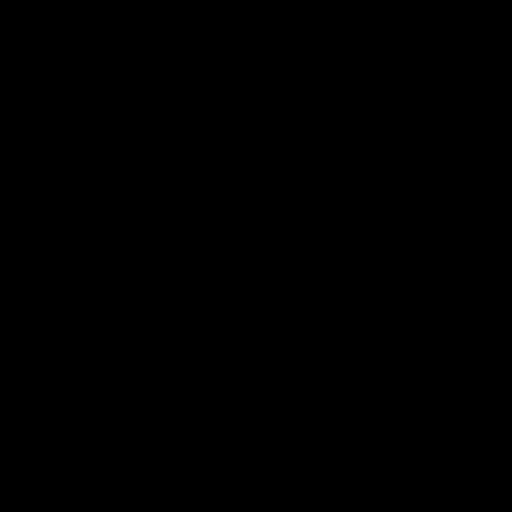

[Series 605: mip collection · coronal · 1.83mm/px · 1 of 32 slices shown]
[im 1/32]
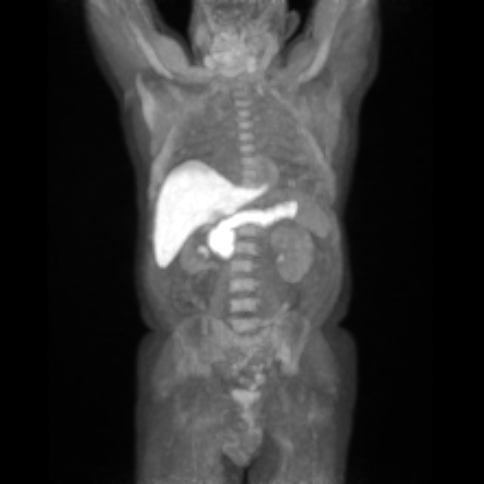

[Series 606: range-ct sk_thigh 5.0 (id)<alpha range> · 5 of 211 slices shown (2 of 2)]
[im 1/211]
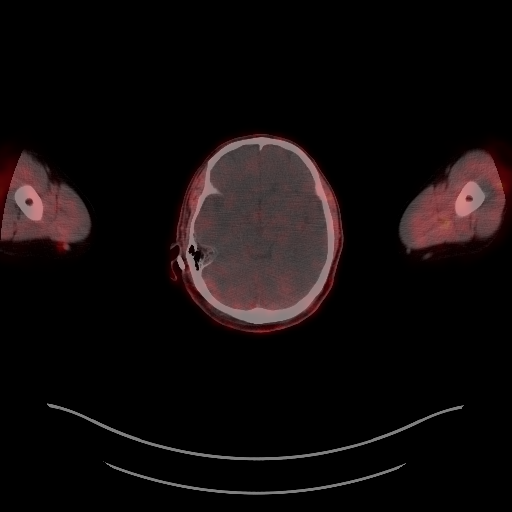
[im 53/211]
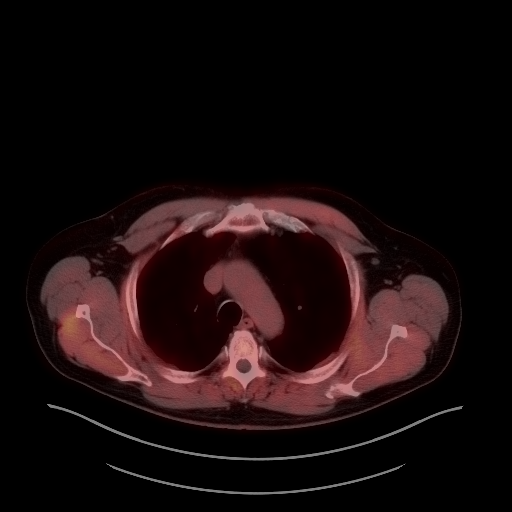
[im 106/211]
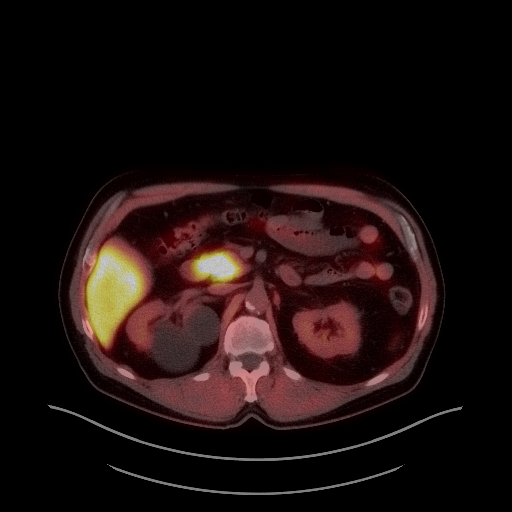
[im 158/211]
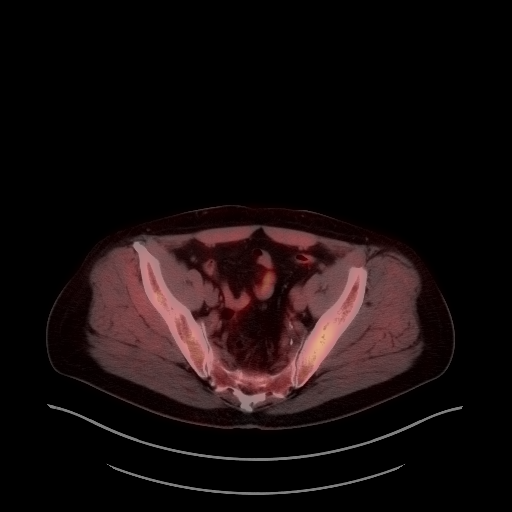
[im 211/211]
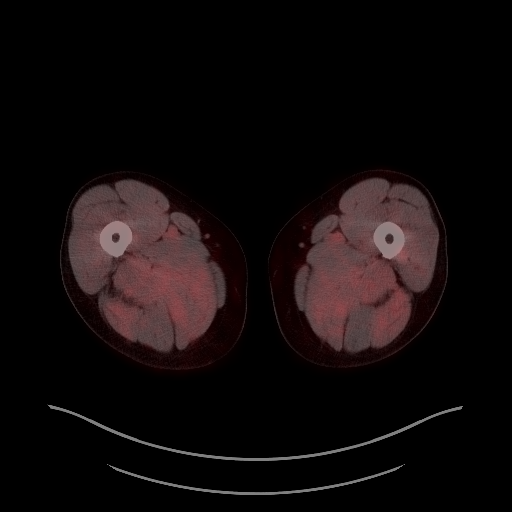

[25 of 25 positions shown; findings below may reference images not displayed]

FINDINGS: NECK

No radiotracer activity in neck lymph nodes.

CHEST

No radiotracer accumulation within mediastinal or hilar lymph nodes.
No suspicious pulmonary nodules on the CT scan.

ABDOMEN/PELVIS

No abnormal radiotracer activity within the liver, pancreas, adrenal
glands, or spleen. No radiotracer accumulation and nodes in the
abdomen or pelvis. Physiologic activity noted in the liver and
pancreas.

No focal activity in the prostate bed.

SKELETON

No focal  activity to suggest skeletal metastasis.
IMPRESSION: No evidence of local prostate cancer recurrence within the pelvis.

No evidence of distant metastatic disease.

## 2017-05-31 DIAGNOSIS — C61 Malignant neoplasm of prostate: Secondary | ICD-10-CM | POA: Diagnosis not present

## 2017-06-05 DIAGNOSIS — Z08 Encounter for follow-up examination after completed treatment for malignant neoplasm: Secondary | ICD-10-CM | POA: Diagnosis not present

## 2017-06-05 DIAGNOSIS — Z1283 Encounter for screening for malignant neoplasm of skin: Secondary | ICD-10-CM | POA: Diagnosis not present

## 2017-06-05 DIAGNOSIS — D225 Melanocytic nevi of trunk: Secondary | ICD-10-CM | POA: Diagnosis not present

## 2017-06-05 DIAGNOSIS — B078 Other viral warts: Secondary | ICD-10-CM | POA: Diagnosis not present

## 2017-06-05 DIAGNOSIS — Z85820 Personal history of malignant melanoma of skin: Secondary | ICD-10-CM | POA: Diagnosis not present

## 2017-06-07 DIAGNOSIS — C61 Malignant neoplasm of prostate: Secondary | ICD-10-CM | POA: Diagnosis not present

## 2017-06-07 DIAGNOSIS — N5201 Erectile dysfunction due to arterial insufficiency: Secondary | ICD-10-CM | POA: Diagnosis not present

## 2017-06-07 DIAGNOSIS — N393 Stress incontinence (female) (male): Secondary | ICD-10-CM | POA: Diagnosis not present

## 2017-10-24 DIAGNOSIS — H40053 Ocular hypertension, bilateral: Secondary | ICD-10-CM | POA: Diagnosis not present

## 2017-11-07 ENCOUNTER — Encounter: Payer: Self-pay | Admitting: Family Medicine

## 2017-11-07 ENCOUNTER — Ambulatory Visit (INDEPENDENT_AMBULATORY_CARE_PROVIDER_SITE_OTHER): Payer: Medicare Other | Admitting: Family Medicine

## 2017-11-07 VITALS — BP 138/86 | HR 58 | Temp 98.3°F | Ht 66.75 in | Wt 195.6 lb

## 2017-11-07 DIAGNOSIS — N289 Disorder of kidney and ureter, unspecified: Secondary | ICD-10-CM | POA: Diagnosis not present

## 2017-11-07 DIAGNOSIS — I10 Essential (primary) hypertension: Secondary | ICD-10-CM | POA: Diagnosis not present

## 2017-11-07 DIAGNOSIS — R0602 Shortness of breath: Secondary | ICD-10-CM

## 2017-11-07 DIAGNOSIS — C61 Malignant neoplasm of prostate: Secondary | ICD-10-CM | POA: Diagnosis not present

## 2017-11-07 DIAGNOSIS — R200 Anesthesia of skin: Secondary | ICD-10-CM

## 2017-11-07 DIAGNOSIS — E782 Mixed hyperlipidemia: Secondary | ICD-10-CM

## 2017-11-07 DIAGNOSIS — Z23 Encounter for immunization: Secondary | ICD-10-CM

## 2017-11-07 DIAGNOSIS — R739 Hyperglycemia, unspecified: Secondary | ICD-10-CM | POA: Diagnosis not present

## 2017-11-07 LAB — LIPID PANEL
CHOL/HDL RATIO: 4
Cholesterol: 163 mg/dL (ref 0–200)
HDL: 43.1 mg/dL (ref >39.00)
LDL Cholesterol: 87 mg/dL (ref 0–99)
NONHDL: 120.16
Triglycerides: 165 mg/dL — ABNORMAL HIGH (ref 0.0–149.0)
VLDL: 33 mg/dL (ref 0.0–40.0)

## 2017-11-07 LAB — TSH: TSH: 3.11 u[IU]/mL (ref 0.35–4.50)

## 2017-11-07 LAB — BASIC METABOLIC PANEL
BUN: 21 mg/dL (ref 6–23)
CHLORIDE: 107 meq/L (ref 96–112)
CO2: 25 meq/L (ref 19–32)
CREATININE: 1.49 mg/dL (ref 0.40–1.50)
Calcium: 9.7 mg/dL (ref 8.4–10.5)
GFR: 49.55 mL/min — ABNORMAL LOW (ref >60.00)
Glucose, Bld: 91 mg/dL (ref 70–99)
Potassium: 4.7 mEq/L (ref 3.5–5.1)
Sodium: 141 mEq/L (ref 135–145)

## 2017-11-07 LAB — POC URINALSYSI DIPSTICK (AUTOMATED)
Bilirubin, UA: NEGATIVE
Glucose, UA: NEGATIVE
KETONES UA: NEGATIVE
Leukocytes, UA: NEGATIVE
Nitrite, UA: NEGATIVE
PH UA: 6.5 (ref 5.0–8.0)
PROTEIN UA: NEGATIVE
RBC UA: NEGATIVE
Spec Grav, UA: 1.025 (ref 1.010–1.025)
Urobilinogen, UA: 0.2 E.U./dL

## 2017-11-07 LAB — HEPATIC FUNCTION PANEL
ALT: 17 U/L (ref 0–53)
AST: 15 U/L (ref 0–37)
Albumin: 4.7 g/dL (ref 3.5–5.2)
Alkaline Phosphatase: 40 U/L (ref 39–117)
Bilirubin, Direct: 0.1 mg/dL (ref 0.0–0.3)
TOTAL PROTEIN: 6.8 g/dL (ref 6.0–8.3)
Total Bilirubin: 0.6 mg/dL (ref 0.2–1.2)

## 2017-11-07 LAB — CBC WITH DIFFERENTIAL/PLATELET
BASOS PCT: 1 % (ref 0.0–3.0)
Basophils Absolute: 0.1 10*3/uL (ref 0.0–0.1)
EOS ABS: 0.1 10*3/uL (ref 0.0–0.7)
Eosinophils Relative: 2 % (ref 0.0–5.0)
HEMATOCRIT: 42.8 % (ref 39.0–52.0)
Hemoglobin: 14.7 g/dL (ref 13.0–17.0)
LYMPHS ABS: 1.9 10*3/uL (ref 0.7–4.0)
LYMPHS PCT: 27 % (ref 12.0–46.0)
MCHC: 34.3 g/dL (ref 30.0–36.0)
MCV: 88.7 fl (ref 78.0–100.0)
MONOS PCT: 8.1 % (ref 3.0–12.0)
Monocytes Absolute: 0.6 10*3/uL (ref 0.1–1.0)
NEUTROS ABS: 4.5 10*3/uL (ref 1.4–7.7)
NEUTROS PCT: 61.9 % (ref 43.0–77.0)
PLATELETS: 213 10*3/uL (ref 150.0–400.0)
RBC: 4.83 Mil/uL (ref 4.22–5.81)
RDW: 13.8 % (ref 11.5–15.5)
WBC: 7.2 10*3/uL (ref 4.0–10.5)

## 2017-11-07 LAB — HEMOGLOBIN A1C: Hgb A1c MFr Bld: 6 % (ref 4.6–6.5)

## 2017-11-07 LAB — VITAMIN B12: Vitamin B-12: 252 pg/mL (ref 211–911)

## 2017-11-07 NOTE — Progress Notes (Signed)
Subjective:    Patient ID: [name] [name]., male    DOB: 1946/02/27, 72 y.o.   MRN: [id number]  HPI Here to follow up on issues. He has a few things he is concerned with. First he exercises regularly, primarily by swimming, and he swims at least 3 days a week. He has noticed over the past few months that he gets more SOB while swimming than he used to. He does not have to stop but he feels that he has to work a little harder to breathe. No chest pain. He had a normal nuclear stress test in 2006. His BP has been quite stable at home, usually in the 120s or 130s over 70s or 80s. He has had a low resting heart rate for years, usually in the 37s. He also has noticed some intermittent numbness in the left side of his face for the past 3 weeks. This is mostly in the left cheek and left upper lip. No weakness of the facial muscles and no pain. He has had more trouble hearing lately and he is scheduled for another audiologic evaluation at the [facility] clinic next month. He was diagnosed with glaucoma earlier this year and is treated at the [facility] for that. He also mentions a slight dry cough that comes and goes which started a few months ago. No allergy symptoms or PND.    Review of Systems  Constitutional: Negative.   HENT: Negative.   Eyes: Negative.   Respiratory: Positive for cough and shortness of breath. Negative for chest tightness and wheezing.   Cardiovascular: Negative.   Gastrointestinal: Negative.   Genitourinary: Negative.   Musculoskeletal: Negative.   Skin: Negative.   Neurological: Positive for numbness. Negative for weakness.  Psychiatric/Behavioral: Negative.        Objective:   Physical Exam  Constitutional: He is oriented to person, place, and time. He appears well-developed and well-nourished. No distress.  HENT:  Head: Normocephalic and atraumatic.  Right Ear: External ear normal.  Left Ear: External ear normal.  Nose: Nose normal.  Mouth/Throat: Oropharynx is clear and moist.  No oropharyngeal exudate.  Wears hearing aids   Eyes: Pupils are equal, round, and reactive to light. Conjunctivae and EOM are normal. Right eye exhibits no discharge. Left eye exhibits no discharge. No scleral icterus.  Neck: Neck supple. No JVD present. No tracheal deviation present. No thyromegaly present.  Cardiovascular: Normal rate, regular rhythm, normal heart sounds and intact distal pulses. Exam reveals no gallop and no friction rub.  No murmur heard. EKG shows sinus bradycardia   Pulmonary/Chest: Effort normal and breath sounds normal. No respiratory distress. He has no wheezes. He has no rales. He exhibits no tenderness.  Abdominal: Soft. Bowel sounds are normal. He exhibits no distension and no mass. There is no tenderness. There is no rebound and no guarding.  Musculoskeletal: Normal range of motion. He exhibits no edema or tenderness.  Lymphadenopathy:    He has no cervical adenopathy.  Neurological: He is alert and oriented to person, place, and time. He has normal reflexes. He displays normal reflexes. No cranial nerve deficit. He exhibits normal muscle tone. Coordination normal.  Skin: Skin is warm and dry. No rash noted. He is not diaphoretic. No erythema. No pallor.  Psychiatric: He has a normal mood and affect. His behavior is normal. Judgment and thought content normal.          Assessment & Plan:  His HTN is stable. He has some resting bradycardia.  He is experiencing some mild SOB on exertion, and I think this is more likely from demand problems related to the bradycardia then from ischemia. However we will refer him to Cardiology to evaluate this further. For the intermittent facial numbness we will set up a brain MRI to rule out CNS problems. Check a B12 level today and an A1c along with the other labs.  Alysia Penna, MD

## 2017-11-10 ENCOUNTER — Encounter: Payer: Self-pay | Admitting: Family Medicine

## 2017-11-14 ENCOUNTER — Ambulatory Visit
Admission: RE | Admit: 2017-11-14 | Discharge: 2017-11-14 | Disposition: A | Discharge status: Home / Self Care | Payer: Medicare Other | Source: Ambulatory Visit | Attending: Family Medicine | Admitting: Family Medicine

## 2017-11-14 ENCOUNTER — Telehealth: Payer: Self-pay | Admitting: Cardiovascular Disease

## 2017-11-14 ENCOUNTER — Other Ambulatory Visit: Payer: Self-pay | Admitting: Family Medicine

## 2017-11-14 DIAGNOSIS — Z77018 Contact with and (suspected) exposure to other hazardous metals: Secondary | ICD-10-CM

## 2017-11-14 DIAGNOSIS — R200 Anesthesia of skin: Secondary | ICD-10-CM

## 2017-11-14 DIAGNOSIS — Z136 Encounter for screening for cardiovascular disorders: Secondary | ICD-10-CM | POA: Diagnosis not present

## 2017-11-14 MED ORDER — GADOBENATE DIMEGLUMINE 529 MG/ML IV SOLN
18.0000 mL | Freq: Once | INTRAVENOUS | Status: AC | PRN
  Administered 2017-11-14: 18 mL via INTRAVENOUS

## 2017-11-14 NOTE — Telephone Encounter (Signed)
Patient wanted to know if it was okay to fly with a low heart rate. Encouraged patient to follow up with his PCP since we have not see the patient yet. Patient is schedule to see [name] in October. Patient will call his PCP.

## 2017-11-14 NOTE — Telephone Encounter (Signed)
New Message:    Patient has questions before his appt 10/17.

## 2017-11-15 ENCOUNTER — Encounter: Payer: Self-pay | Admitting: *Deleted

## 2017-12-06 DIAGNOSIS — C61 Malignant neoplasm of prostate: Secondary | ICD-10-CM | POA: Diagnosis not present

## 2017-12-18 DIAGNOSIS — Z08 Encounter for follow-up examination after completed treatment for malignant neoplasm: Secondary | ICD-10-CM | POA: Diagnosis not present

## 2017-12-18 DIAGNOSIS — D225 Melanocytic nevi of trunk: Secondary | ICD-10-CM | POA: Diagnosis not present

## 2017-12-18 DIAGNOSIS — Z85820 Personal history of malignant melanoma of skin: Secondary | ICD-10-CM | POA: Diagnosis not present

## 2017-12-18 DIAGNOSIS — Z1283 Encounter for screening for malignant neoplasm of skin: Secondary | ICD-10-CM | POA: Diagnosis not present

## 2017-12-20 NOTE — Progress Notes (Signed)
Cardiology Office Note   Date:  12/21/2017   ID:  [name] [name]., DOB 12-25-1945, MRN [id number]  PCP:  [name] Morale, MD  Cardiologist:   Jenkins Rouge, MD   No chief complaint on file.     History of Present Illness: [name] [name]. is a 72 y.o. male who presents for consultation regarding dyspnea Referred by Dr [name] [name] Reviewed his office note from 11/07/17  Last few weeks more dyspnea while swimming which he does regularly No  history of CAD  But gets tightness in chest with swimming and with his dyspnea Had normal stress test in 2006  BP well controlled Has had low HR;s for years in the 54's Takes ACE for BP no AV nodal blocking drugs. On statin for HLD.  Some left facial numbness with normal MRI 11/14/17 reviewed.  Vascular screen done 05/26/16 no AAA or proximal PVD   Labs from 11/07/17 reviewed normal  TSH A1c Cr elevated 1.43 LDL 87  He is retired Therapist, art and was in [place] Some exposure to Asbestosis but not disrupted forms  Non smoker quit over 50 years ago No wheezing cough or sputum      Past Medical History:  Diagnosis Date  . ABNORMAL EKG 10/07/2008  . ADVEF, DRUG/MED/BIOL SUBST, OTHER DRUG NOS 09/26/2006  . Allergy    seasonal  . Cataract    left eye-surgery 07-2015  . DEGENERATIVE JOINT DISEASE, MODERATE 04/13/2007  . Diabetes mellitus    type 2, diet controlled   . DIVERTICULOSIS, COLON 04/13/2007  . ELEVATED PROSTATE SPECIFIC ANTIGEN 04/14/2007  . Glaucoma    sees [name]   . GOUT 05/16/2007  . HYPERLIPIDEMIA 09/26/2006  . HYPERTENSION 04/13/2007  . JOINT EFFUSION, KNEE 06/15/2009  . Melanoma [facility])    sees [name]   . PROSTATE CANCER 10/05/2009   sees [name]  . Renal insufficiency    RESOLVED PER PATIENT    Past Surgical History:  Procedure Laterality Date  . ACROMIOPLASTY Right 05-06-14   per [name]   . BUNIONECTOMY     bilateral  . CATARACT EXTRACTION Left    per [name]   . COLONOSCOPY  07/10/2015   per [name], benign polyp, repeat  in 10 yrs   . CYSTECTOMY     benign from hand  . GLAUCOMA SURGERY Bilateral 2014   . INSERTION OF MESH N/A 01/10/2013   Procedure: INSERTION OF MESH;  Surgeon: Earnstine Regal, MD;  Location: [place];  Service: General;  Laterality: N/A;  . KNEE ARTHROSCOPY Right Jan. 2013   right knee, per [name]   . KNEE ARTHROSCOPY Left 01-14-15   per [name]   . MELANOMA EXCISION  2012   per [name]  . PROSTATECTOMY     per [name]  . TONSILLECTOMY    . UMBILICAL HERNIA REPAIR  11/01/2011   Procedure: HERNIA REPAIR UMBILICAL ADULT;  Surgeon: Earnstine Regal, MD;  Location: [place];  Service: General;  Laterality: N/A;  Repair umbilical hernia with mesh patch  . VASECTOMY    . VENTRAL HERNIA REPAIR  01/10/2013   with mesh     [name]  . VENTRAL HERNIA REPAIR N/A 01/10/2013   Procedure: HERNIA REPAIR VENTRAL ADULT ;  Surgeon: Earnstine Regal, MD;  Location: [place];  Service: General;  Laterality: N/A;     Current Outpatient Medications  Medication Sig Dispense Refill  . aspirin  EC 81 MG tablet Take 81 mg by mouth daily.    . bimatoprost (LUMIGAN) 0.01 % SOLN Place 1 drop into both eyes at bedtime.    . Coenzyme Q10 (COQ-10) 100 MG CAPS Take 100 mg by mouth daily.    . DORZOLAMIDE HCL-TIMOLOL MAL OP Apply to eye.    . Multiple Vitamins-Minerals (MULTIVITAMIN WITH MINERALS) tablet Take 1 tablet by mouth 2 (two) times a week. Sundays and wednesdays    . rosuvastatin (CRESTOR) 20 MG tablet Take 10 mg by mouth daily.    . tadalafil (CIALIS) 20 MG tablet Take 1 tablet (20 mg total) by mouth daily as needed. 18 tablet 3  . fenofibrate 160 MG tablet Take 1 tablet (160 mg total) by mouth daily. 90 tablet 3  . lisinopril (PRINIVIL,ZESTRIL) 20 MG tablet Take 1 tablet (20 mg total) by mouth daily. 90 tablet 3   No current facility-administered medications for this visit.     Allergies:   Patient has no known allergies.    Social History:  The patient  reports that he quit  smoking about 45 years ago. He started smoking about 54 years ago. He has a 9.00 pack-year smoking history. He has never used smokeless tobacco. He reports that he does not drink alcohol or use drugs.   Family History:  The patient's family history includes Alcohol abuse in his mother; Stroke in his father and mother.    ROS:  Please see the history of present illness.   Otherwise, review of systems are positive for none.   All other systems are reviewed and negative.    PHYSICAL EXAM: VS:  BP (!) 162/90   Pulse (!) 58   Ht 5\' 6"  (1.676 m)   Wt 196 lb 12.8 oz (89.3 kg)   SpO2 99%   BMI 31.76 kg/m  , BMI Body mass index is 31.76 kg/m. Affect appropriate Healthy:  appears stated age 57: normal Neck supple with no adenopathy JVP normal no bruits no thyromegaly Lungs clear with no wheezing and good diaphragmatic motion Heart:  S1/S2 no murmur, no rub, gallop or click PMI normal Abdomen: benighn, BS positve, no tenderness, no AAA no bruit.  No HSM or HJR Distal pulses intact with no bruits No edema Neuro non-focal Skin warm and dry No muscular weakness    EKG:  SB rate 41 no AV block or other abnormalities 11/07/17   Recent Labs: 11/07/2017: ALT 17; BUN 21; Creatinine, Ser 1.49; Hemoglobin 14.7; Platelets 213.0; Potassium 4.7; Sodium 141; TSH 3.11    Lipid Panel    Component Value Date/Time   CHOL 163 11/07/2017 1133   TRIG 165.0 (H) 11/07/2017 1133   HDL 43.10 11/07/2017 1133   CHOLHDL 4 11/07/2017 1133   VLDL 33.0 11/07/2017 1133   LDLCALC 87 11/07/2017 1133   LDLDIRECT 82.1 04/07/2011 0807      Wt Readings from Last 3 Encounters:  12/21/17 196 lb 12.8 oz (89.3 kg)  11/07/17 195 lb 9.6 oz (88.7 kg)  05/09/17 194 lb (88 kg)      Other studies Reviewed: Additional studies/ records that were reviewed today include: Notes Dr Beecher Mcardle, ECG.    ASSESSMENT AND PLAN:  1.  Bradycardia:  Chronic no AV block K /TSH ok not on AV nodal drugs 30 day monitor to  assess average HR and detect long pauses  2.  HTN:  Well controlled.  Continue current medications and low sodium Dash type diet.   3. HLD:  At  goal with no documented vascular Dx continue crestor  4. Dyspnea and chest Pain  F/u TTE assess RV and LV function Tightness in chest a bit worrisome for angina. With low HR good candidate for cardiac CTA   Current medicines are reviewed at length with the patient today.  The patient does not have concerns regarding medicines.  The following changes have been made:  None   Labs/ tests ordered today include: Event monitor, TTE and cardiac CTA   Orders Placed This Encounter  Procedures  . CT CORONARY MORPH W/CTA COR W/SCORE W/CA W/CM &/OR WO/CM  . CT CORONARY FRACTIONAL FLOW RESERVE DATA PREP  . CT CORONARY FRACTIONAL FLOW RESERVE FLUID ANALYSIS  . Basic metabolic panel  . CARDIAC EVENT MONITOR  . ECHOCARDIOGRAM COMPLETE     Disposition:   FU with cardiology in a year      Signed, Jenkins Rouge, MD  12/21/2017 12:00 PM    [address], [place], [place]  [address] Phone: [phone]; Fax: [phone]

## 2017-12-21 ENCOUNTER — Encounter: Payer: Self-pay | Admitting: Cardiovascular Disease

## 2017-12-21 ENCOUNTER — Ambulatory Visit (INDEPENDENT_AMBULATORY_CARE_PROVIDER_SITE_OTHER): Payer: Medicare Other | Admitting: Cardiovascular Disease

## 2017-12-21 VITALS — BP 162/90 | HR 58 | Ht 66.0 in | Wt 196.8 lb

## 2017-12-21 DIAGNOSIS — R0600 Dyspnea, unspecified: Secondary | ICD-10-CM | POA: Diagnosis not present

## 2017-12-21 DIAGNOSIS — R001 Bradycardia, unspecified: Secondary | ICD-10-CM

## 2017-12-21 DIAGNOSIS — I10 Essential (primary) hypertension: Secondary | ICD-10-CM | POA: Diagnosis not present

## 2017-12-21 DIAGNOSIS — I209 Angina pectoris, unspecified: Secondary | ICD-10-CM | POA: Diagnosis not present

## 2017-12-21 DIAGNOSIS — E782 Mixed hyperlipidemia: Secondary | ICD-10-CM | POA: Diagnosis not present

## 2017-12-21 LAB — BASIC METABOLIC PANEL
BUN / CREAT RATIO: 15 (ref 10–24)
BUN: 20 mg/dL (ref 8–27)
CO2: 20 mmol/L (ref 20–29)
CREATININE: 1.3 mg/dL — AB (ref 0.76–1.27)
Calcium: 9.8 mg/dL (ref 8.6–10.2)
Chloride: 104 mmol/L (ref 96–106)
GFR calc non Af Amer: 55 mL/min/{1.73_m2} — ABNORMAL LOW (ref >59)
GFR, EST AFRICAN AMERICAN: 64 mL/min/{1.73_m2} (ref >59)
GLUCOSE: 92 mg/dL (ref 65–99)
Potassium: 4.3 mmol/L (ref 3.5–5.2)
SODIUM: 142 mmol/L (ref 134–144)

## 2017-12-21 NOTE — Patient Instructions (Addendum)
Medication Instructions:  Your physician recommends that you continue on your current medications as directed. Please refer to the Current Medication list given to you today.  Labwork: Your physician recommends that you have lab work today- BMET   Testing/Procedures: Your physician has recommended that you wear an event monitor. Event monitors are medical devices that record the heart's electrical activity. Doctors most often [place] these monitors to diagnose arrhythmias. Arrhythmias are problems with the speed or rhythm of the heartbeat. The monitor is a small, portable device. You can wear one while you do your normal daily activities. This is usually used to diagnose what is causing palpitations/syncope (passing out).  Your physician has requested that you have cardiac CT. Cardiac computed tomography (CT) is a painless test that uses an x-ray machine to take clear, detailed pictures of your heart. For further information please visit HugeFiesta.tn. Please follow instruction sheet as given.  Your physician has requested that you have an echocardiogram. Echocardiography is a painless test that uses sound waves to create images of your heart. It provides your doctor with information about the size and shape of your heart and how well your heart's chambers and valves are working. This procedure takes approximately one hour. There are no restrictions for this procedure.  Follow-Up: Your physician wants you to follow-up next available with [name].    If you need a refill on your cardiac medications before your next appointment, please call your pharmacy.   Please arrive at the [facility] main entrance of [facility] at xx:xx AM (30-45 minutes prior to test start time)  [facility] [place], [place] [address] [phone]  Proceed to the [facility] Radiology Department (First Floor).  Please follow these instructions carefully (unless otherwise  directed):  Hold all erectile dysfunction medications at least 48 hours prior to test.  On the Night Before the Test: . Be sure to Drink plenty of water. . Do not consume any caffeinated/decaffeinated beverages or chocolate 12 hours prior to your test. . Do not take any antihistamines 12 hours prior to your test.  On the Day of the Test: . Drink plenty of water. Do not drink any water within one hour of the test. . Do not eat any food 4 hours prior to the test. . You may take your regular medications prior to the test.    *For Clinical Staff only. Please instruct patient the following:*        -Drink plenty of water   After the Test: . Drink plenty of water. . After receiving IV contrast, you may experience a mild flushed feeling. This is normal. . On occasion, you may experience a mild rash up to 24 hours after the test. This is not dangerous. If this occurs, you can take Benadryl 25 mg and increase your fluid intake. . If you experience trouble breathing, this can be serious. If it is severe call 911 IMMEDIATELY. If it is mild, please call our office. . If you take any of these medications: Glipizide/Metformin, Avandament, Glucavance, please do not take 48 hours after completing test.

## 2017-12-28 ENCOUNTER — Ambulatory Visit (HOSPITAL_COMMUNITY): Payer: Medicare Other | Attending: Internal Medicine

## 2017-12-28 ENCOUNTER — Ambulatory Visit (INDEPENDENT_AMBULATORY_CARE_PROVIDER_SITE_OTHER): Payer: Medicare Other

## 2017-12-28 ENCOUNTER — Other Ambulatory Visit: Payer: Self-pay

## 2017-12-28 DIAGNOSIS — R001 Bradycardia, unspecified: Secondary | ICD-10-CM

## 2017-12-28 DIAGNOSIS — R0600 Dyspnea, unspecified: Secondary | ICD-10-CM | POA: Insufficient documentation

## 2017-12-28 DIAGNOSIS — E782 Mixed hyperlipidemia: Secondary | ICD-10-CM | POA: Diagnosis not present

## 2017-12-28 DIAGNOSIS — I10 Essential (primary) hypertension: Secondary | ICD-10-CM

## 2017-12-29 DIAGNOSIS — C61 Malignant neoplasm of prostate: Secondary | ICD-10-CM | POA: Diagnosis not present

## 2017-12-29 DIAGNOSIS — N393 Stress incontinence (female) (male): Secondary | ICD-10-CM | POA: Diagnosis not present

## 2017-12-29 DIAGNOSIS — R9721 Rising PSA following treatment for malignant neoplasm of prostate: Secondary | ICD-10-CM | POA: Diagnosis not present

## 2017-12-29 DIAGNOSIS — N5231 Erectile dysfunction following radical prostatectomy: Secondary | ICD-10-CM | POA: Diagnosis not present

## 2018-01-10 ENCOUNTER — Ambulatory Visit (HOSPITAL_COMMUNITY)
Admission: RE | Admit: 2018-01-10 | Discharge: 2018-01-10 | Disposition: A | Discharge status: Home / Self Care | Payer: Medicare Other | Source: Ambulatory Visit | Attending: Cardiovascular Disease | Admitting: Cardiovascular Disease

## 2018-01-10 ENCOUNTER — Encounter: Payer: Medicare Other | Admitting: *Deleted

## 2018-01-10 DIAGNOSIS — I209 Angina pectoris, unspecified: Secondary | ICD-10-CM

## 2018-01-10 DIAGNOSIS — I2510 Atherosclerotic heart disease of native coronary artery without angina pectoris: Secondary | ICD-10-CM | POA: Diagnosis not present

## 2018-01-10 DIAGNOSIS — Z006 Encounter for examination for normal comparison and control in clinical research program: Secondary | ICD-10-CM

## 2018-01-10 MED ORDER — NITROGLYCERIN 0.4 MG SL SUBL
0.8000 mg | SUBLINGUAL_TABLET | Freq: Once | SUBLINGUAL | Status: AC
  Administered 2018-01-10: 0.8 mg via SUBLINGUAL
  Filled 2018-01-10: qty 25

## 2018-01-10 MED ORDER — NITROGLYCERIN 0.4 MG SL SUBL
SUBLINGUAL_TABLET | SUBLINGUAL | Status: AC
  Filled 2018-01-10: qty 2

## 2018-01-10 MED ORDER — IOHEXOL 300 MG/ML  SOLN
100.0000 mL | Freq: Once | INTRAMUSCULAR | Status: AC | PRN
  Administered 2018-01-10: 100 mL via INTRAVENOUS

## 2018-01-10 NOTE — Progress Notes (Signed)
CT complete. PAtient denies any complaints. Offered patient snack, patient stated he would eat after leaving.

## 2018-01-10 NOTE — Progress Notes (Signed)
Patient ambulatory out of department with steady gait. Denies any complaints.  

## 2018-01-10 NOTE — Research (Addendum)
CADFEM Informed Consent                  Subject Name:   [name] [name]. Blanch Media Subject met inclusion and exclusion criteria.  The informed consent form, study requirements and expectations were reviewed with the subject and questions and concerns were addressed prior to the signing of the consent form.  The subject verbalized understanding of the trial requirements.  The subject agreed to participate in the CADFEM trial and signed the informed consent.  The informed consent was obtained prior to performance of any protocol-specific procedures for the subject.  A copy of the signed informed consent was given to the subject and a copy was placed in the subject's medical record.   [place] Chalmers, Research Assistant 01/10/2018 13:15 p.m.

## 2018-01-15 NOTE — Progress Notes (Signed)
Cardiology Office Note    Date:  01/16/2018   ID:  [name] Howells., DOB Oct 04, 1945, MRN [id number]  PCP:  [name], MD  Cardiologist: [name]   Chief Complaint: cath discussion   History of Present Illness:   [name] Drummonds. is a 72 y.o. male with hx of HTN and HLD here for cath discussion following abnormal coronary CT.  Establish care with [name] 12/21/17 for dyspnea while swimming which he does regularly. Recently worsen. Noted bradycardic. Echo showed normal LVEF. Pending 30 days monitor. Coronary CT as below: IMPRESSION: 1.  Mild aortic root dilatation 4.0 cm  2. Significant CAD possibly obstructive in proximal RCA, mid circumflex and OM1 Study sent for FFR CT  3.  Calcium score 318 which is 2 th percentile for age and sex  FFR CT suggests obstructive in circumflex can set him up for cath next week.  Here today for cath discussion. Continues to have chest pressure with dyspnea while swimming. Last swim yesterday. No syncope or dizziness. He denies orthopnea, PND, LE edema, melena or blood in his stool or urine. Wearing monitor, last day 11/22.   Past Medical History:  Diagnosis Date  . ABNORMAL EKG 10/07/2008  . ADVEF, DRUG/MED/BIOL SUBST, OTHER DRUG NOS 09/26/2006  . Allergy    seasonal  . Cataract    left eye-surgery 07-2015  . DEGENERATIVE JOINT DISEASE, MODERATE 04/13/2007  . Diabetes mellitus    type 2, diet controlled   . DIVERTICULOSIS, COLON 04/13/2007  . ELEVATED PROSTATE SPECIFIC ANTIGEN 04/14/2007  . Glaucoma    sees [name]   . GOUT 05/16/2007  . HYPERLIPIDEMIA 09/26/2006  . HYPERTENSION 04/13/2007  . JOINT EFFUSION, KNEE 06/15/2009  . Melanoma [facility])    sees [name]   . PROSTATE CANCER 10/05/2009   sees [name]  . Renal insufficiency    RESOLVED PER PATIENT    Past Surgical History:  Procedure Laterality Date  . ACROMIOPLASTY Right 05-06-14   per [name]   . BUNIONECTOMY     bilateral  . CATARACT EXTRACTION Left    per Dr.  [name]   . COLONOSCOPY  07/10/2015   per [name], benign polyp, repeat in 10 yrs   . CYSTECTOMY     benign from hand  . GLAUCOMA SURGERY Bilateral 2014   . INSERTION OF MESH N/A 01/10/2013   Procedure: INSERTION OF MESH;  Surgeon: Earnstine Regal, MD;  Location: [place];  Service: General;  Laterality: N/A;  . KNEE ARTHROSCOPY Right Jan. 2013   right knee, per [name]   . KNEE ARTHROSCOPY Left 01-14-15   per [name]   . MELANOMA EXCISION  2012   per [name]  . PROSTATECTOMY     per [name]  . TONSILLECTOMY    . UMBILICAL HERNIA REPAIR  11/01/2011   Procedure: HERNIA REPAIR UMBILICAL ADULT;  Surgeon: Earnstine Regal, MD;  Location: [place];  Service: General;  Laterality: N/A;  Repair umbilical hernia with mesh patch  . VASECTOMY    . VENTRAL HERNIA REPAIR  01/10/2013   with mesh     [name]  . VENTRAL HERNIA REPAIR N/A 01/10/2013   Procedure: HERNIA REPAIR VENTRAL ADULT ;  Surgeon: Earnstine Regal, MD;  Location: [facility];  Service: General;  Laterality: N/A;    Current Medications: Prior to Admission medications   Medication Sig Start Date End Date Taking? Authorizing Provider  aspirin EC 81 MG tablet Take 81 mg by mouth daily.    [provider]  bimatoprost (LUMIGAN) 0.01 % SOLN Place 1 drop into both eyes at bedtime.    [provider]  Coenzyme Q10 (COQ-10) 100 MG CAPS Take 100 mg by mouth daily.    [provider]  DORZOLAMIDE HCL-TIMOLOL MAL OP Apply to eye.    [provider]  fenofibrate 160 MG tablet Take 1 tablet (160 mg total) by mouth daily. 04/26/12 07/06/15  [name], MD  lisinopril (PRINIVIL,ZESTRIL) 20 MG tablet Take 1 tablet (20 mg total) by mouth daily. 10/25/11 07/06/15  [name], MD  Multiple Vitamins-Minerals (MULTIVITAMIN WITH MINERALS) tablet Take 1 tablet by mouth 2 (two) times a week. Sundays and wednesdays    [provider]  rosuvastatin (CRESTOR) 20 MG tablet Take 10 mg by mouth  daily.    [provider]  tadalafil (CIALIS) 20 MG tablet Take 1 tablet (20 mg total) by mouth daily as needed. 04/07/10   [name], MD    Allergies:   Patient has no known allergies.   Social History   Socioeconomic History  . Marital status: Married    Spouse name: Not on file  . Number of children: Not on file  . Years of education: Not on file  . Highest education level: Not on file  Occupational History  . Not on file  Social Needs  . Financial resource strain: Not on file  . Food insecurity:    Worry: Not on file    Inability: Not on file  . Transportation needs:    Medical: Not on file    Non-medical: Not on file  Tobacco Use  . Smoking status: Former Smoker    Packs/day: 1.00    Years: 9.00    Pack years: 9.00    Start date: 03/08/1963    Last attempt to quit: 03/07/1972    Years since quitting: 45.8  . Smokeless tobacco: Never Used  . Tobacco comment: States AAA completed   Substance and Sexual Activity  . Alcohol use: No    Alcohol/week: 0.0 standard drinks    Comment: rare now   . Drug use: No  . Sexual activity: Not on file  Lifestyle  . Physical activity:    Days per week: Not on file    Minutes per session: Not on file  . Stress: Not on file  Relationships  . Social connections:    Talks on phone: Not on file    Gets together: Not on file    Attends religious service: Not on file    Active member of club or organization: Not on file    Attends meetings of clubs or organizations: Not on file    Relationship status: Not on file  Other Topics Concern  . Not on file  Social History Narrative   Was in the [employer]; Office manager   Currently can go to the [employer] orange exposure      Wife; married 40 years   2 children; grands no      Family History:  The patient's family history includes Alcohol abuse in his mother; Stroke in his father and mother.   ROS:   Please see the history of present illness.    ROS All other  systems reviewed and are negative.   PHYSICAL EXAM:   VS:  BP 130/72   Pulse 62   Ht 5'  6" (1.676 m)   Wt 194 lb (88 kg)   SpO2 97%   BMI 31.31 kg/m    GEN: Well nourished, well developed, in no acute distress  HEENT: normal  Neck: no JVD, carotid bruits, or masses Cardiac: RRR; no murmurs, rubs, or gallops,no edema  Respiratory:  clear to auscultation bilaterally, normal work of breathing GI: soft, nontender, nondistended, + BS MS: no deformity or atrophy  Skin: warm and dry, no rash Neuro:  Alert and Oriented x 3, Strength and sensation are intact Psych: euthymic mood, full affect  Wt Readings from Last 3 Encounters:  01/16/18 194 lb (88 kg)  12/21/17 196 lb 12.8 oz (89.3 kg)  11/07/17 195 lb 9.6 oz (88.7 kg)      Studies/Labs Reviewed:   EKG:  EKG is ordered today.  The ekg ordered today demonstrates sinus brady at 51 bms, PR 25ms, non specific TWI in inferior lateral leads which is somewhat new   Recent Labs: 11/07/2017: ALT 17; Hemoglobin 14.7; Platelets 213.0; TSH 3.11 12/21/2017: BUN 20; Creatinine, Ser 1.30; Potassium 4.3; Sodium 142   Lipid Panel    Component Value Date/Time   CHOL 163 11/07/2017 1133   TRIG 165.0 (H) 11/07/2017 1133   HDL 43.10 11/07/2017 1133   CHOLHDL 4 11/07/2017 1133   VLDL 33.0 11/07/2017 1133   LDLCALC 87 11/07/2017 1133   LDLDIRECT 82.1 04/07/2011 0807    Additional studies/ records that were reviewed today include:   Echocardiogram: 12/28/17 Study Conclusions  - Left ventricle: The cavity size was normal. Wall thickness was   normal. Systolic function was normal. The estimated ejection   fraction was in the range of 60% to 65%. Wall motion was normal;   there were no regional wall motion abnormalities. Doppler   parameters are consistent with mildly elevated ventricular   filling pressure (E/e&' medial = 15). - Aortic valve: Trileaflet; normal thickness leaflets.   Transvalvular velocity was within the normal range. There  was no   stenosis. There was trivial regurgitation. Mean gradient (S): 5   mm Hg. - Mitral valve: Transvalvular velocity was within the normal range.   There was no evidence for stenosis. There was trivial   regurgitation. - Left atrium: The atrium was normal in size. - Right ventricle: The cavity size was normal. Wall thickness was   normal. Systolic function was normal. RV systolic pressure (S,   est): 28 mm Hg. - Right atrium: The atrium was normal in size. - Tricuspid valve: There was trivial regurgitation. - Inferior vena cava: The vessel was normal in size. - Pericardium, extracardiac: There was no pericardial effusion.   ASSESSMENT & PLAN:    1. Abnormal coronary CT - FFR CT suggests obstructive in circumflex. EKG with non specific TWI in inferior lateral leads which is somewhat new. Avoid swimming.  - Continue ASA and statin. Non BB given bradycardia. Echo with normal LVEF.  The patient understands that risks include but are not limited to stroke (1 in 1000), death (1 in 74), kidney failure [usually temporary] (1 in 500), bleeding (1 in 200), allergic reaction [possibly serious] (1 in 200), and agrees to proceed.   2. HTN - BP stable on Lisinopril. SCr was 1.3 on 12/21/17. Check BMET again. Hold ACE. Advised to drink plenty of water today.   3. HLD - 11/07/2017: Cholesterol 163; HDL 43.10; LDL Cholesterol 87; Triglycerides 165.0; VLDL 33.0  - LDL goal less than 70. Wife is concern about ? Recent memory issue. Undergoing  work up with PCP. After long discussed>> increase crestor to 20gm. Monitor symptoms. LIpid panel and LFTs in 6-8 weeks.   4. Bradycardia - On monitor. Last day 11/22. No syncope. Not on any AV blocking agent.    Medication Adjustments/Labs and Tests Ordered: Current medicines are reviewed at length with the patient today.  Concerns regarding medicines are outlined above.  Medication changes, Labs and Tests ordered today are listed in the Patient  Instructions below. Patient Instructions  Medication Instructions:  Your physician recommends that you continue on your current medications as directed. Please refer to the Current Medication list given to you today.  If you need a refill on your cardiac medications before your next appointment, please call your pharmacy.   Lab work: TODAY:  BMET & CBC  If you have labs (blood work) drawn today and your tests are completely normal, you will receive your results only by: Marland Kitchen MyChart Message (if you have MyChart) OR . A paper copy in the mail If you have any lab test that is abnormal or we need to change your treatment, we will call you to review the results.  Testing/Procedures: Your physician has requested that you have a cardiac catheterization. Cardiac catheterization is used to diagnose and/or treat various heart conditions. Doctors may recommend this procedure for a number of different reasons. The most common reason is to evaluate chest pain. Chest pain can be a symptom of coronary artery disease (CAD), and cardiac catheterization can show whether plaque is narrowing or blocking your heart's arteries. This procedure is also used to evaluate the valves, as well as measure the blood flow and oxygen levels in different parts of your heart. For further information please visit HugeFiesta.tn. Please follow instruction sheet below     [facility] [place], [place] [address] Dept: [phone] Loc: [phone]  [name] Pettway.  01/16/2018  You are scheduled for a Cardiac Catheterization on Wednesday, November 13 with Dr. Glenetta Hew.  1. Please arrive at the [facility] (Main Entrance A) at [facility]: [address] [place], [place] [address] at 9:30 AM (This time is two hours before your procedure to ensure your preparation). Free valet parking service is  available.   Special note: Every effort is made to have your procedure done on time. Please understand that emergencies sometimes delay scheduled procedures.  2. Diet: Do not eat solid foods after midnight.  The patient may have clear liquids until 5am upon the day of the procedure. DRINK PLENTY OF WATER  3. Labs: You will need to have blood drawn on TODAY 4. Medication instructions in preparation for your procedure:   Contrast Allergy: No  HOLD THE LISINOPRIL THE MORNING OF YOUR PROCEDURE  On the morning of your procedure, take your Aspirin and any morning medicines NOT listed above.  You may use sips of water.  5. Plan for one night stay--bring personal belongings. 6. Bring a current list of your medications and current insurance cards. 7. You MUST have a responsible person to drive you home. 8. Someone MUST be with you the first 24 hours after you arrive home or your discharge will be delayed. 9. Please wear clothes that are easy to get on and off and wear slip-on shoes.  Thank you for allowing [place] to care for you!   -- [facility]    Follow-Up: Your physician recommends that  you schedule a follow-up appointment in: 01/30/18 ARRIVE 10:45 TO SEE Truitt Merle, NP   Any Other Special Instructions Will Be Listed Below (If Applicable).   Coronary Angiogram With Stent Coronary angiogram with stent placement is a procedure to widen or open a narrow blood vessel of the heart (coronary artery). Arteries may become blocked by cholesterol buildup (plaques) in the lining of the wall. When a coronary artery becomes partially blocked, blood flow to that area decreases. This may lead to chest pain or a heart attack (myocardial infarction). A stent is a small piece of metal that looks like mesh or a spring. Stent placement may be done as treatment for a heart attack or right after a coronary angiogram in which a blocked artery is found. Let your health care  provider know about:  Any allergies you have.  All medicines you are taking, including vitamins, herbs, eye drops, creams, and over-the-counter medicines.  Any problems you or family members have had with anesthetic medicines.  Any blood disorders you have.  Any surgeries you have had.  Any medical conditions you have.  Whether you are pregnant or may be pregnant. What are the risks? Generally, this is a safe procedure. However, problems may occur, including:  Damage to the heart or its blood vessels.  A return of blockage.  Bleeding, infection, or bruising at the insertion site.  A collection of blood under the skin (hematoma) at the insertion site.  A blood clot in another part of the body.  Kidney injury.  Allergic reaction to the dye or contrast that is used.  Bleeding into the abdomen (retroperitoneal bleeding).  What happens before the procedure? Staying hydrated Follow instructions from your health care provider about hydration, which may include:  Up to 2 hours before the procedure - you may continue to drink clear liquids, such as water, clear fruit juice, black coffee, and plain tea.  Eating and drinking restrictions Follow instructions from your health care provider about eating and drinking, which may include:  8 hours before the procedure - stop eating heavy meals or foods such as meat, fried foods, or fatty foods.  6 hours before the procedure - stop eating light meals or foods, such as toast or cereal.  2 hours before the procedure - stop drinking clear liquids.  Ask your health care provider about:  Changing or stopping your regular medicines. This is especially important if you are taking diabetes medicines or blood thinners.  Taking medicines such as ibuprofen. These medicines can thin your blood. Do not take these medicines before your procedure if your health care provider instructs you not to. Generally, aspirin is recommended before a  procedure of passing a small, thin tube (catheter) through a blood vessel and into the heart (cardiac catheterization).  What happens during the procedure?  An IV tube will be inserted into one of your veins.  You will be given one or more of the following: ? A medicine to help you relax (sedative). ? A medicine to numb the area where the catheter will be inserted into an artery (local anesthetic).  To reduce your risk of infection: ? Your health care team will wash or sanitize their hands. ? Your skin will be washed with soap. ? Hair may be removed from the area where the catheter will be inserted.  Using a guide wire, the catheter will be inserted into an artery. The location may be in your groin, in your wrist, or in the fold of  your arm (near your elbow).  A type of X-ray (fluoroscopy) will be used to help guide the catheter to the opening of the arteries in the heart.  A dye will be injected into the catheter, and X-rays will be taken. The dye will help to show where any narrowing or blockages are located in the arteries.  A tiny wire will be guided to the blocked spot, and a balloon will be inflated to make the artery wider.  The stent will be expanded and will crush the plaques into the wall of the vessel. The stent will hold the area open and improve the blood flow. Most stents have a drug coating to reduce the risk of the stent narrowing over time.  The artery may be made wider using a drill, laser, or other tools to remove plaques.  When the blood flow is better, the catheter will be removed. The lining of the artery will grow over the stent, which stays where it was placed. This procedure may vary among health care providers and hospitals. What happens after the procedure?  If the procedure is done through the leg, you will be kept in bed lying flat for about 6 hours. You will be instructed to not bend and not cross your legs.  The insertion site will be checked  frequently.  The pulse in your foot or wrist will be checked frequently.  You may have additional blood tests, X-rays, and a test that records the electrical activity of your heart (electrocardiogram, or ECG). This information is not intended to replace advice given to you by your health care provider. Make sure you discuss any questions you have with your health care provider. Document Released: 08/28/2002 Document Revised: 10/22/2015 Document Reviewed: 09/27/2015 Elsevier Interactive Patient Education  2018 [address], [place], [place]  01/16/2018 9:44 AM    [facility] [place], [place], [place]  [address] Phone: [phone]; Fax: [phone]

## 2018-01-15 NOTE — H&P (View-Only) (Signed)
Cardiology Office Note    Date:  01/16/2018   ID:  [name] Howells., DOB 1945/06/19, MRN [id number]  PCP:  [name], MD  Cardiologist: [name]   Chief Complaint: cath discussion   History of Present Illness:   [name] [name]. is a 72 y.o. male with hx of HTN and HLD here for cath discussion following abnormal coronary CT.  Establish care with [name] 12/21/17 for dyspnea while swimming which he does regularly. Recently worsen. Noted bradycardic. Echo showed normal LVEF. Pending 30 days monitor. Coronary CT as below: IMPRESSION: 1.  Mild aortic root dilatation 4.0 cm  2. Significant CAD possibly obstructive in proximal RCA, mid circumflex and OM1 Study sent for FFR CT  3.  Calcium score 318 which is 45 th percentile for age and sex  FFR CT suggests obstructive in circumflex can set him up for cath next week.  Here today for cath discussion. Continues to have chest pressure with dyspnea while swimming. Last swim yesterday. No syncope or dizziness. He denies orthopnea, PND, LE edema, melena or blood in his stool or urine. Wearing monitor, last day 11/22.   Past Medical History:  Diagnosis Date  . ABNORMAL EKG 10/07/2008  . ADVEF, DRUG/MED/BIOL SUBST, OTHER DRUG NOS 09/26/2006  . Allergy    seasonal  . Cataract    left eye-surgery 07-2015  . DEGENERATIVE JOINT DISEASE, MODERATE 04/13/2007  . Diabetes mellitus    type 2, diet controlled   . DIVERTICULOSIS, COLON 04/13/2007  . ELEVATED PROSTATE SPECIFIC ANTIGEN 04/14/2007  . Glaucoma    sees [name]   . GOUT 05/16/2007  . HYPERLIPIDEMIA 09/26/2006  . HYPERTENSION 04/13/2007  . JOINT EFFUSION, KNEE 06/15/2009  . Melanoma [facility])    sees [name]   . PROSTATE CANCER 10/05/2009   sees [name]  . Renal insufficiency    RESOLVED PER PATIENT    Past Surgical History:  Procedure Laterality Date  . ACROMIOPLASTY Right 05-06-14   per [name]   . BUNIONECTOMY     bilateral  . CATARACT EXTRACTION Left    per Dr.  [name]   . COLONOSCOPY  07/10/2015   per [name], benign polyp, repeat in 10 yrs   . CYSTECTOMY     benign from hand  . GLAUCOMA SURGERY Bilateral 2014   . INSERTION OF MESH N/A 01/10/2013   Procedure: INSERTION OF MESH;  Surgeon: Earnstine Regal, MD;  Location: [place];  Service: General;  Laterality: N/A;  . KNEE ARTHROSCOPY Right Jan. 2013   right knee, per [name]   . KNEE ARTHROSCOPY Left 01-14-15   per [name]   . MELANOMA EXCISION  2012   per [name]  . PROSTATECTOMY     per [name]  . TONSILLECTOMY    . UMBILICAL HERNIA REPAIR  11/01/2011   Procedure: HERNIA REPAIR UMBILICAL ADULT;  Surgeon: Earnstine Regal, MD;  Location: [place];  Service: General;  Laterality: N/A;  Repair umbilical hernia with mesh patch  . VASECTOMY    . VENTRAL HERNIA REPAIR  01/10/2013   with mesh     [name]  . VENTRAL HERNIA REPAIR N/A 01/10/2013   Procedure: HERNIA REPAIR VENTRAL ADULT ;  Surgeon: Earnstine Regal, MD;  Location: [place];  Service: General;  Laterality: N/A;    Current Medications: Prior to Admission medications   Medication Sig Start Date End Date Taking? Authorizing Provider  aspirin EC 81 MG tablet Take 81 mg by mouth daily.    [provider]  bimatoprost (LUMIGAN) 0.01 % SOLN Place 1 drop into both eyes at bedtime.    [provider]  Coenzyme Q10 (COQ-10) 100 MG CAPS Take 100 mg by mouth daily.    [provider]  DORZOLAMIDE HCL-TIMOLOL MAL OP Apply to eye.    [provider]  fenofibrate 160 MG tablet Take 1 tablet (160 mg total) by mouth daily. 04/26/12 07/06/15  [name], MD  lisinopril (PRINIVIL,ZESTRIL) 20 MG tablet Take 1 tablet (20 mg total) by mouth daily. 10/25/11 07/06/15  [name], MD  Multiple Vitamins-Minerals (MULTIVITAMIN WITH MINERALS) tablet Take 1 tablet by mouth 2 (two) times a week. Sundays and wednesdays    [provider]  rosuvastatin (CRESTOR) 20 MG tablet Take 10 mg by mouth  daily.    [provider]  tadalafil (CIALIS) 20 MG tablet Take 1 tablet (20 mg total) by mouth daily as needed. 04/07/10   [name], MD    Allergies:   Patient has no known allergies.   Social History   Socioeconomic History  . Marital status: Married    Spouse name: Not on file  . Number of children: Not on file  . Years of education: Not on file  . Highest education level: Not on file  Occupational History  . Not on file  Social Needs  . Financial resource strain: Not on file  . Food insecurity:    Worry: Not on file    Inability: Not on file  . Transportation needs:    Medical: Not on file    Non-medical: Not on file  Tobacco Use  . Smoking status: Former Smoker    Packs/day: 1.00    Years: 9.00    Pack years: 9.00    Start date: 03/08/1963    Last attempt to quit: 03/07/1972    Years since quitting: 45.8  . Smokeless tobacco: Never Used  . Tobacco comment: States AAA completed   Substance and Sexual Activity  . Alcohol use: No    Alcohol/week: 0.0 standard drinks    Comment: rare now   . Drug use: No  . Sexual activity: Not on file  Lifestyle  . Physical activity:    Days per week: Not on file    Minutes per session: Not on file  . Stress: Not on file  Relationships  . Social connections:    Talks on phone: Not on file    Gets together: Not on file    Attends religious service: Not on file    Active member of club or organization: Not on file    Attends meetings of clubs or organizations: Not on file    Relationship status: Not on file  Other Topics Concern  . Not on file  Social History Narrative   Was in the [employer]; Office manager   Currently can go to the [employer] orange exposure      Wife; married 40 years   2 children; grands no      Family History:  The patient's family history includes Alcohol abuse in his mother; Stroke in his father and mother.   ROS:   Please see the history of present illness.    ROS All other  systems reviewed and are negative.   PHYSICAL EXAM:   VS:  BP 130/72   Pulse 62   Ht 5'  6" (1.676 m)   Wt 194 lb (88 kg)   SpO2 97%   BMI 31.31 kg/m    GEN: Well nourished, well developed, in no acute distress  HEENT: normal  Neck: no JVD, carotid bruits, or masses Cardiac: RRR; no murmurs, rubs, or gallops,no edema  Respiratory:  clear to auscultation bilaterally, normal work of breathing GI: soft, nontender, nondistended, + BS MS: no deformity or atrophy  Skin: warm and dry, no rash Neuro:  Alert and Oriented x 3, Strength and sensation are intact Psych: euthymic mood, full affect  Wt Readings from Last 3 Encounters:  01/16/18 194 lb (88 kg)  12/21/17 196 lb 12.8 oz (89.3 kg)  11/07/17 195 lb 9.6 oz (88.7 kg)      Studies/Labs Reviewed:   EKG:  EKG is ordered today.  The ekg ordered today demonstrates sinus brady at 51 bms, PR 245ms, non specific TWI in inferior lateral leads which is somewhat new   Recent Labs: 11/07/2017: ALT 17; Hemoglobin 14.7; Platelets 213.0; TSH 3.11 12/21/2017: BUN 20; Creatinine, Ser 1.30; Potassium 4.3; Sodium 142   Lipid Panel    Component Value Date/Time   CHOL 163 11/07/2017 1133   TRIG 165.0 (H) 11/07/2017 1133   HDL 43.10 11/07/2017 1133   CHOLHDL 4 11/07/2017 1133   VLDL 33.0 11/07/2017 1133   LDLCALC 87 11/07/2017 1133   LDLDIRECT 82.1 04/07/2011 0807    Additional studies/ records that were reviewed today include:   Echocardiogram: 12/28/17 Study Conclusions  - Left ventricle: The cavity size was normal. Wall thickness was   normal. Systolic function was normal. The estimated ejection   fraction was in the range of 60% to 65%. Wall motion was normal;   there were no regional wall motion abnormalities. Doppler   parameters are consistent with mildly elevated ventricular   filling pressure (E/e&' medial = 15). - Aortic valve: Trileaflet; normal thickness leaflets.   Transvalvular velocity was within the normal range. There  was no   stenosis. There was trivial regurgitation. Mean gradient (S): 5   mm Hg. - Mitral valve: Transvalvular velocity was within the normal range.   There was no evidence for stenosis. There was trivial   regurgitation. - Left atrium: The atrium was normal in size. - Right ventricle: The cavity size was normal. Wall thickness was   normal. Systolic function was normal. RV systolic pressure (S,   est): 28 mm Hg. - Right atrium: The atrium was normal in size. - Tricuspid valve: There was trivial regurgitation. - Inferior vena cava: The vessel was normal in size. - Pericardium, extracardiac: There was no pericardial effusion.   ASSESSMENT & PLAN:    1. Abnormal coronary CT - FFR CT suggests obstructive in circumflex. EKG with non specific TWI in inferior lateral leads which is somewhat new. Avoid swimming.  - Continue ASA and statin. Non BB given bradycardia. Echo with normal LVEF.  The patient understands that risks include but are not limited to stroke (1 in 1000), death (1 in 82), kidney failure [usually temporary] (1 in 500), bleeding (1 in 200), allergic reaction [possibly serious] (1 in 200), and agrees to proceed.   2. HTN - BP stable on Lisinopril. SCr was 1.3 on 12/21/17. Check BMET again. Hold ACE. Advised to drink plenty of water today.   3. HLD - 11/07/2017: Cholesterol 163; HDL 43.10; LDL Cholesterol 87; Triglycerides 165.0; VLDL 33.0  - LDL goal less than 70. Wife is concern about ? Recent memory issue. Undergoing  work up with PCP. After long discussed>> increase crestor to 20gm. Monitor symptoms. LIpid panel and LFTs in 6-8 weeks.   4. Bradycardia - On monitor. Last day 11/22. No syncope. Not on any AV blocking agent.    Medication Adjustments/Labs and Tests Ordered: Current medicines are reviewed at length with the patient today.  Concerns regarding medicines are outlined above.  Medication changes, Labs and Tests ordered today are listed in the Patient  Instructions below. Patient Instructions  Medication Instructions:  Your physician recommends that you continue on your current medications as directed. Please refer to the Current Medication list given to you today.  If you need a refill on your cardiac medications before your next appointment, please call your pharmacy.   Lab work: TODAY:  BMET & CBC  If you have labs (blood work) drawn today and your tests are completely normal, you will receive your results only by: Marland Kitchen MyChart Message (if you have MyChart) OR . A paper copy in the mail If you have any lab test that is abnormal or we need to change your treatment, we will call you to review the results.  Testing/Procedures: Your physician has requested that you have a cardiac catheterization. Cardiac catheterization is used to diagnose and/or treat various heart conditions. Doctors may recommend this procedure for a number of different [name]. The most common reason is to evaluate chest pain. Chest pain can be a symptom of coronary artery disease (CAD), and cardiac catheterization can show whether plaque is narrowing or blocking your heart's arteries. This procedure is also used to evaluate the valves, as well as measure the blood flow and oxygen levels in different parts of your heart. For further information please visit [email or web address]tn. Please follow instruction sheet below     [facility] [place], [place] [address] Dept: [phone] Loc: [phone]  Crisanto Nied.  01/16/2018  You are scheduled for a Cardiac Catheterization on Wednesday, November 13 with Dr. Glenetta Hew.  1. Please arrive at the [facility] (Main Entrance A) at [facility]: [address] [place], [place] [address] at 9:30 AM (This time is two hours before your procedure to ensure your preparation). Free valet parking service is  available.   Special note: Every effort is made to have your procedure done on time. Please understand that emergencies sometimes delay scheduled procedures.  2. Diet: Do not eat solid foods after midnight.  The patient may have clear liquids until 5am upon the day of the procedure. DRINK PLENTY OF WATER  3. Labs: You will need to have blood drawn on TODAY 4. Medication instructions in preparation for your procedure:   Contrast Allergy: No  HOLD THE LISINOPRIL THE MORNING OF YOUR PROCEDURE  On the morning of your procedure, take your Aspirin and any morning medicines NOT listed above.  You may use sips of water.  5. Plan for one night stay--bring personal belongings. 6. Bring a current list of your medications and current insurance cards. 7. You MUST have a responsible person to drive you home. 8. Someone MUST be with you the first 24 hours after you arrive home or your discharge will be delayed. 9. Please wear clothes that are easy to get on and off and wear slip-on shoes.  Thank you for allowing [place] to care for you!   -- [facility]    Follow-Up: Your physician recommends that  you schedule a follow-up appointment in: 01/30/18 ARRIVE 10:45 TO SEE Truitt Merle, NP   Any Other Special Instructions Will Be Listed Below (If Applicable).   Coronary Angiogram With Stent Coronary angiogram with stent placement is a procedure to widen or open a narrow blood vessel of the heart (coronary artery). Arteries may become blocked by cholesterol buildup (plaques) in the lining of the wall. When a coronary artery becomes partially blocked, blood flow to that area decreases. This may lead to chest pain or a heart attack (myocardial infarction). A stent is a small piece of metal that looks like mesh or a spring. Stent placement may be done as treatment for a heart attack or right after a coronary angiogram in which a blocked artery is found. Let your health care  provider know about:  Any allergies you have.  All medicines you are taking, including vitamins, herbs, eye drops, creams, and over-the-counter medicines.  Any problems you or family members have had with anesthetic medicines.  Any blood disorders you have.  Any surgeries you have had.  Any medical conditions you have.  Whether you are pregnant or may be pregnant. What are the risks? Generally, this is a safe procedure. However, problems may occur, including:  Damage to the heart or its blood vessels.  A return of blockage.  Bleeding, infection, or bruising at the insertion site.  A collection of blood under the skin (hematoma) at the insertion site.  A blood clot in another part of the body.  Kidney injury.  Allergic reaction to the dye or contrast that is used.  Bleeding into the abdomen (retroperitoneal bleeding).  What happens before the procedure? Staying hydrated Follow instructions from your health care provider about hydration, which may include:  Up to 2 hours before the procedure - you may continue to drink clear liquids, such as water, clear fruit juice, black coffee, and plain tea.  Eating and drinking restrictions Follow instructions from your health care provider about eating and drinking, which may include:  8 hours before the procedure - stop eating heavy meals or foods such as meat, fried foods, or fatty foods.  6 hours before the procedure - stop eating light meals or foods, such as toast or cereal.  2 hours before the procedure - stop drinking clear liquids.  Ask your health care provider about:  Changing or stopping your regular medicines. This is especially important if you are taking diabetes medicines or blood thinners.  Taking medicines such as ibuprofen. These medicines can thin your blood. Do not take these medicines before your procedure if your health care provider instructs you not to. Generally, aspirin is recommended before a  procedure of passing a small, thin tube (catheter) through a blood vessel and into the heart (cardiac catheterization).  What happens during the procedure?  An IV tube will be inserted into one of your veins.  You will be given one or more of the following: ? A medicine to help you relax (sedative). ? A medicine to numb the area where the catheter will be inserted into an artery (local anesthetic).  To reduce your risk of infection: ? Your health care team will wash or sanitize their hands. ? Your skin will be washed with soap. ? Hair may be removed from the area where the catheter will be inserted.  Using a guide wire, the catheter will be inserted into an artery. The location may be in your groin, in your wrist, or in the fold of  your arm (near your elbow).  A type of X-ray (fluoroscopy) will be used to help guide the catheter to the opening of the arteries in the heart.  A dye will be injected into the catheter, and X-rays will be taken. The dye will help to show where any narrowing or blockages are located in the arteries.  A tiny wire will be guided to the blocked spot, and a balloon will be inflated to make the artery wider.  The stent will be expanded and will crush the plaques into the wall of the vessel. The stent will hold the area open and improve the blood flow. Most stents have a drug coating to reduce the risk of the stent narrowing over time.  The artery may be made wider using a drill, laser, or other tools to remove plaques.  When the blood flow is better, the catheter will be removed. The lining of the artery will grow over the stent, which stays where it was placed. This procedure may vary among health care providers and hospitals. What happens after the procedure?  If the procedure is done through the leg, you will be kept in bed lying flat for about 6 hours. You will be instructed to not bend and not cross your legs.  The insertion site will be checked  frequently.  The pulse in your foot or wrist will be checked frequently.  You may have additional blood tests, X-rays, and a test that records the electrical activity of your heart (electrocardiogram, or ECG). This information is not intended to replace advice given to you by your health care provider. Make sure you discuss any questions you have with your health care provider. Document Released: 08/28/2002 Document Revised: 10/22/2015 Document Reviewed: 09/27/2015 Elsevier Interactive Patient Education  2018 [place], [place], [place]  01/16/2018 9:44 AM    [facility] [place], [place], [place]  [address] Phone: [phone]; Fax: [phone]

## 2018-01-16 ENCOUNTER — Ambulatory Visit (INDEPENDENT_AMBULATORY_CARE_PROVIDER_SITE_OTHER): Payer: Medicare Other | Admitting: Physician Assistant

## 2018-01-16 ENCOUNTER — Encounter: Payer: Self-pay | Admitting: Physician Assistant

## 2018-01-16 VITALS — BP 130/72 | HR 62 | Ht 66.0 in | Wt 194.0 lb

## 2018-01-16 DIAGNOSIS — I209 Angina pectoris, unspecified: Secondary | ICD-10-CM | POA: Diagnosis not present

## 2018-01-16 DIAGNOSIS — R0600 Dyspnea, unspecified: Secondary | ICD-10-CM | POA: Diagnosis not present

## 2018-01-16 DIAGNOSIS — E782 Mixed hyperlipidemia: Secondary | ICD-10-CM | POA: Diagnosis not present

## 2018-01-16 DIAGNOSIS — I10 Essential (primary) hypertension: Secondary | ICD-10-CM

## 2018-01-16 DIAGNOSIS — R931 Abnormal findings on diagnostic imaging of heart and coronary circulation: Secondary | ICD-10-CM | POA: Diagnosis not present

## 2018-01-16 LAB — CBC
HEMATOCRIT: 43.9 % (ref 37.5–51.0)
Hemoglobin: 14.9 g/dL (ref 13.0–17.7)
MCH: 30 pg (ref 26.6–33.0)
MCHC: 33.9 g/dL (ref 31.5–35.7)
MCV: 88 fL (ref 79–97)
Platelets: 274 10*3/uL (ref 150–450)
RBC: 4.97 x10E6/uL (ref 4.14–5.80)
RDW: 14.3 % (ref 12.3–15.4)
WBC: 6.7 10*3/uL (ref 3.4–10.8)

## 2018-01-16 LAB — BASIC METABOLIC PANEL
BUN / CREAT RATIO: 16 (ref 10–24)
BUN: 20 mg/dL (ref 8–27)
CO2: 24 mmol/L (ref 20–29)
CREATININE: 1.27 mg/dL (ref 0.76–1.27)
Calcium: 9.9 mg/dL (ref 8.6–10.2)
Chloride: 107 mmol/L — ABNORMAL HIGH (ref 96–106)
GFR calc non Af Amer: 57 mL/min/{1.73_m2} — ABNORMAL LOW (ref >59)
GFR, EST AFRICAN AMERICAN: 66 mL/min/{1.73_m2} (ref >59)
Glucose: 92 mg/dL (ref 65–99)
Potassium: 4.2 mmol/L (ref 3.5–5.2)
Sodium: 141 mmol/L (ref 134–144)

## 2018-01-16 MED ORDER — ROSUVASTATIN CALCIUM 20 MG PO TABS: 20.0000 mg | ORAL_TABLET | Freq: Every day | ORAL | 3 refills | Status: DC

## 2018-01-16 MED ORDER — NITROGLYCERIN 0.4 MG SL SUBL: 0.4000 mg | SUBLINGUAL_TABLET | SUBLINGUAL | 3 refills | Status: DC | PRN

## 2018-01-16 NOTE — Patient Instructions (Addendum)
Medication Instructions:  Your physician has recommended you make the following change in your medication:  1.  INCREASE the Crestor to 20 mg daily 2.  START Nitroglycerin 0.4 s/l tablets only as directed  If you need a refill on your cardiac medications before your next appointment, please call your pharmacy.   Lab work: TODAY:  BMET & CBC  If you have labs (blood work) drawn today and your tests are completely normal, you will receive your results only by: Marland Kitchen MyChart Message (if you have MyChart) OR . A paper copy in the mail If you have any lab test that is abnormal or we need to change your treatment, we will call you to review the results.  Testing/Procedures: Your physician has requested that you have a cardiac catheterization. Cardiac catheterization is used to diagnose and/or treat various heart conditions. Doctors may recommend this procedure for a number of different reasons. The most common reason is to evaluate chest pain. Chest pain can be a symptom of coronary artery disease (CAD), and cardiac catheterization can show whether plaque is narrowing or blocking your heart's arteries. This procedure is also used to evaluate the valves, as well as measure the blood flow and oxygen levels in different parts of your heart. For further information please visit [email or web address]. Please follow instruction sheet below     [facility] [place], [place] [address] Dept: [phone] Loc: [phone]  Christianjames Soule.  01/16/2018  You are scheduled for a Cardiac Catheterization on Wednesday, November 13 with Dr. Glenetta Hew.  1. Please arrive at the [facility] (Main Entrance A) at [facility]: [address] [place], [place] [address] at 9:30 AM (This time is two hours before your procedure to ensure your preparation). Free valet parking service is available.    Special note: Every effort is made to have your procedure done on time. Please understand that emergencies sometimes delay scheduled procedures.  2. Diet: Do not eat solid foods after midnight.  The patient may have clear liquids until 5am upon the day of the procedure. DRINK PLENTY OF WATER  3. Labs: You will need to have blood drawn on TODAY 4. Medication instructions in preparation for your procedure:   Contrast Allergy: No  HOLD THE LISINOPRIL THE MORNING OF YOUR PROCEDURE  On the morning of your procedure, take your Aspirin and any morning medicines NOT listed above.  You may use sips of water.  5. Plan for one night stay--bring personal belongings. 6. Bring a current list of your medications and current insurance cards. 7. You MUST have a responsible person to drive you home. 8. Someone MUST be with you the first 24 hours after you arrive home or your discharge will be delayed. 9. Please wear clothes that are easy to get on and off and wear slip-on shoes.  Thank you for allowing [place] to care for you!   -- [facility]    Follow-Up: Your physician recommends that you schedule a follow-up appointment in: 01/30/18 ARRIVE 10:45 TO SEE Truitt Merle, NP   Any Other Special Instructions Will Be Listed Below (If Applicable).   Coronary Angiogram With Stent Coronary angiogram with stent placement is a procedure to widen or open a narrow blood vessel of the heart (coronary artery). Arteries may become blocked by cholesterol buildup (plaques) in the lining of the wall. When a coronary artery becomes partially  blocked, blood flow to that area decreases. This may lead to chest pain or a heart attack (myocardial infarction). A stent is a small piece of metal that looks like mesh or a spring. Stent placement may be done as treatment for a heart attack or right after a coronary angiogram in which a blocked artery is found. Let your health care provider know  about:  Any allergies you have.  All medicines you are taking, including vitamins, herbs, eye drops, creams, and over-the-counter medicines.  Any problems you or family members have had with anesthetic medicines.  Any blood disorders you have.  Any surgeries you have had.  Any medical conditions you have.  Whether you are pregnant or may be pregnant. What are the risks? Generally, this is a safe procedure. However, problems may occur, including:  Damage to the heart or its blood vessels.  A return of blockage.  Bleeding, infection, or bruising at the insertion site.  A collection of blood under the skin (hematoma) at the insertion site.  A blood clot in another part of the body.  Kidney injury.  Allergic reaction to the dye or contrast that is used.  Bleeding into the abdomen (retroperitoneal bleeding).  What happens before the procedure? Staying hydrated Follow instructions from your health care provider about hydration, which may include:  Up to 2 hours before the procedure - you may continue to drink clear liquids, such as water, clear fruit juice, black coffee, and plain tea.  Eating and drinking restrictions Follow instructions from your health care provider about eating and drinking, which may include:  8 hours before the procedure - stop eating heavy meals or foods such as meat, fried foods, or fatty foods.  6 hours before the procedure - stop eating light meals or foods, such as toast or cereal.  2 hours before the procedure - stop drinking clear liquids.  Ask your health care provider about:  Changing or stopping your regular medicines. This is especially important if you are taking diabetes medicines or blood thinners.  Taking medicines such as ibuprofen. These medicines can thin your blood. Do not take these medicines before your procedure if your health care provider instructs you not to. Generally, aspirin is recommended before a procedure of passing  a small, thin tube (catheter) through a blood vessel and into the heart (cardiac catheterization).  What happens during the procedure?  An IV tube will be inserted into one of your veins.  You will be given one or more of the following: ? A medicine to help you relax (sedative). ? A medicine to numb the area where the catheter will be inserted into an artery (local anesthetic).  To reduce your risk of infection: ? Your health care team will wash or sanitize their hands. ? Your skin will be washed with soap. ? Hair may be removed from the area where the catheter will be inserted.  Using a guide wire, the catheter will be inserted into an artery. The location may be in your groin, in your wrist, or in the fold of your arm (near your elbow).  A type of X-ray (fluoroscopy) will be used to help guide the catheter to the opening of the arteries in the heart.  A dye will be injected into the catheter, and X-rays will be taken. The dye will help to show where any narrowing or blockages are located in the arteries.  A tiny wire will be guided to the blocked spot, and a balloon will  be inflated to make the artery wider.  The stent will be expanded and will crush the plaques into the wall of the vessel. The stent will hold the area open and improve the blood flow. Most stents have a drug coating to reduce the risk of the stent narrowing over time.  The artery may be made wider using a drill, laser, or other tools to remove plaques.  When the blood flow is better, the catheter will be removed. The lining of the artery will grow over the stent, which stays where it was placed. This procedure may vary among health care providers and hospitals. What happens after the procedure?  If the procedure is done through the leg, you will be kept in bed lying flat for about 6 hours. You will be instructed to not bend and not cross your legs.  The insertion site will be checked frequently.  The pulse in  your foot or wrist will be checked frequently.  You may have additional blood tests, X-rays, and a test that records the electrical activity of your heart (electrocardiogram, or ECG). This information is not intended to replace advice given to you by your health care provider. Make sure you discuss any questions you have with your health care provider. Document Released: 08/28/2002 Document Revised: 10/22/2015 Document Reviewed: 09/27/2015 Elsevier Interactive Patient Education  [employer].

## 2018-01-17 ENCOUNTER — Other Ambulatory Visit: Payer: Self-pay

## 2018-01-17 ENCOUNTER — Ambulatory Visit (HOSPITAL_COMMUNITY)
Admission: RE | Admit: 2018-01-17 | Discharge: 2018-01-17 | Disposition: A | Discharge status: Home / Self Care | Payer: Medicare Other | Source: Ambulatory Visit | Attending: Cardiology | Admitting: Cardiology

## 2018-01-17 ENCOUNTER — Ambulatory Visit (HOSPITAL_COMMUNITY)
Admission: RE | Disposition: A | Discharge status: Home / Self Care | Payer: Self-pay | Source: Ambulatory Visit | Attending: Cardiology

## 2018-01-17 DIAGNOSIS — Z87891 Personal history of nicotine dependence: Secondary | ICD-10-CM | POA: Diagnosis not present

## 2018-01-17 DIAGNOSIS — Z79899 Other long term (current) drug therapy: Secondary | ICD-10-CM | POA: Diagnosis not present

## 2018-01-17 DIAGNOSIS — I25119 Atherosclerotic heart disease of native coronary artery with unspecified angina pectoris: Secondary | ICD-10-CM | POA: Insufficient documentation

## 2018-01-17 DIAGNOSIS — E785 Hyperlipidemia, unspecified: Secondary | ICD-10-CM | POA: Diagnosis not present

## 2018-01-17 DIAGNOSIS — Z85820 Personal history of malignant melanoma of skin: Secondary | ICD-10-CM | POA: Diagnosis not present

## 2018-01-17 DIAGNOSIS — Z9889 Other specified postprocedural states: Secondary | ICD-10-CM | POA: Diagnosis not present

## 2018-01-17 DIAGNOSIS — R931 Abnormal findings on diagnostic imaging of heart and coronary circulation: Secondary | ICD-10-CM | POA: Diagnosis present

## 2018-01-17 DIAGNOSIS — I209 Angina pectoris, unspecified: Secondary | ICD-10-CM | POA: Diagnosis present

## 2018-01-17 DIAGNOSIS — Z9079 Acquired absence of other genital organ(s): Secondary | ICD-10-CM | POA: Diagnosis not present

## 2018-01-17 DIAGNOSIS — H409 Unspecified glaucoma: Secondary | ICD-10-CM | POA: Diagnosis not present

## 2018-01-17 DIAGNOSIS — Z9842 Cataract extraction status, left eye: Secondary | ICD-10-CM | POA: Diagnosis not present

## 2018-01-17 DIAGNOSIS — Z9852 Vasectomy status: Secondary | ICD-10-CM | POA: Insufficient documentation

## 2018-01-17 DIAGNOSIS — Z8546 Personal history of malignant neoplasm of prostate: Secondary | ICD-10-CM | POA: Insufficient documentation

## 2018-01-17 DIAGNOSIS — Z955 Presence of coronary angioplasty implant and graft: Secondary | ICD-10-CM

## 2018-01-17 DIAGNOSIS — E119 Type 2 diabetes mellitus without complications: Secondary | ICD-10-CM | POA: Diagnosis not present

## 2018-01-17 DIAGNOSIS — Z9841 Cataract extraction status, right eye: Secondary | ICD-10-CM | POA: Insufficient documentation

## 2018-01-17 DIAGNOSIS — I10 Essential (primary) hypertension: Secondary | ICD-10-CM | POA: Diagnosis present

## 2018-01-17 DIAGNOSIS — Z7982 Long term (current) use of aspirin: Secondary | ICD-10-CM | POA: Diagnosis not present

## 2018-01-17 DIAGNOSIS — M109 Gout, unspecified: Secondary | ICD-10-CM | POA: Insufficient documentation

## 2018-01-17 DIAGNOSIS — Z823 Family history of stroke: Secondary | ICD-10-CM | POA: Insufficient documentation

## 2018-01-17 DIAGNOSIS — R001 Bradycardia, unspecified: Secondary | ICD-10-CM | POA: Diagnosis not present

## 2018-01-17 HISTORY — PX: LEFT HEART CATH AND CORONARY ANGIOGRAPHY: CATH118249

## 2018-01-17 HISTORY — PX: CORONARY STENT INTERVENTION: CATH118234

## 2018-01-17 LAB — POCT ACTIVATED CLOTTING TIME
Activated Clotting Time: 219 s
Activated Clotting Time: 224 seconds
Activated Clotting Time: 263 s

## 2018-01-17 SURGERY — LEFT HEART CATH AND CORONARY ANGIOGRAPHY
Anesthesia: LOCAL

## 2018-01-17 MED ORDER — HYDRALAZINE HCL 20 MG/ML IJ SOLN: 5.0000 mg | INTRAMUSCULAR | Status: DC | PRN

## 2018-01-17 MED ORDER — SODIUM CHLORIDE 0.9% FLUSH: 3.0000 mL | Freq: Two times a day (BID) | INTRAVENOUS | Status: DC

## 2018-01-17 MED ORDER — LIDOCAINE HCL (PF) 1 % IJ SOLN
INTRAMUSCULAR | Status: DC | PRN
  Administered 2018-01-17: 2 mL

## 2018-01-17 MED ORDER — ONDANSETRON HCL 4 MG/2ML IJ SOLN: 4.0000 mg | Freq: Four times a day (QID) | INTRAMUSCULAR | Status: DC | PRN

## 2018-01-17 MED ORDER — HEPARIN (PORCINE) IN NACL 1000-0.9 UT/500ML-% IV SOLN
INTRAVENOUS | Status: DC | PRN
  Administered 2018-01-17: 500 mL

## 2018-01-17 MED ORDER — FENTANYL CITRATE (PF) 100 MCG/2ML IJ SOLN
INTRAMUSCULAR | Status: DC | PRN
  Administered 2018-01-17 (×2): 25 ug via INTRAVENOUS

## 2018-01-17 MED ORDER — MIDAZOLAM HCL 2 MG/2ML IJ SOLN
INTRAMUSCULAR | Status: AC
  Filled 2018-01-17: qty 2

## 2018-01-17 MED ORDER — SODIUM CHLORIDE 0.9 % WEIGHT BASED INFUSION: 1.0000 mL/kg/h | INTRAVENOUS | Status: DC

## 2018-01-17 MED ORDER — MIDAZOLAM HCL 2 MG/2ML IJ SOLN
INTRAMUSCULAR | Status: DC | PRN
  Administered 2018-01-17 (×2): 1 mg via INTRAVENOUS

## 2018-01-17 MED ORDER — ACETAMINOPHEN 325 MG PO TABS: 650.0000 mg | ORAL_TABLET | ORAL | Status: DC | PRN

## 2018-01-17 MED ORDER — SODIUM CHLORIDE 0.9 % IV SOLN: 250.0000 mL | INTRAVENOUS | Status: DC | PRN

## 2018-01-17 MED ORDER — HEPARIN (PORCINE) IN NACL 1000-0.9 UT/500ML-% IV SOLN
INTRAVENOUS | Status: AC
  Filled 2018-01-17: qty 1000

## 2018-01-17 MED ORDER — IOHEXOL 350 MG/ML SOLN
INTRAVENOUS | Status: DC | PRN
  Administered 2018-01-17: 110 mL via INTRAVENOUS

## 2018-01-17 MED ORDER — SODIUM CHLORIDE 0.9 % IV SOLN: INTRAVENOUS | Status: AC

## 2018-01-17 MED ORDER — ASPIRIN 81 MG PO CHEW: 81.0000 mg | CHEWABLE_TABLET | ORAL | Status: DC

## 2018-01-17 MED ORDER — CLOPIDOGREL BISULFATE 75 MG PO TABS: 75.0000 mg | ORAL_TABLET | Freq: Every day | ORAL | 8 refills | Status: AC

## 2018-01-17 MED ORDER — VERAPAMIL HCL 2.5 MG/ML IV SOLN
INTRAVENOUS | Status: DC | PRN
  Administered 2018-01-17: 10 mL via INTRA_ARTERIAL

## 2018-01-17 MED ORDER — NITROGLYCERIN 1 MG/10 ML FOR IR/CATH LAB
INTRA_ARTERIAL | Status: AC
  Filled 2018-01-17: qty 10

## 2018-01-17 MED ORDER — FENTANYL CITRATE (PF) 100 MCG/2ML IJ SOLN
INTRAMUSCULAR | Status: AC
  Filled 2018-01-17: qty 2

## 2018-01-17 MED ORDER — ASPIRIN EC 81 MG PO TBEC: 81.0000 mg | DELAYED_RELEASE_TABLET | Freq: Every evening | ORAL | Status: DC

## 2018-01-17 MED ORDER — CLOPIDOGREL BISULFATE 75 MG PO TABS: 75.0000 mg | ORAL_TABLET | Freq: Every day | ORAL | 4 refills | Status: DC

## 2018-01-17 MED ORDER — SODIUM CHLORIDE 0.9% FLUSH: 3.0000 mL | INTRAVENOUS | Status: DC | PRN

## 2018-01-17 MED ORDER — VERAPAMIL HCL 2.5 MG/ML IV SOLN
INTRAVENOUS | Status: AC
  Filled 2018-01-17: qty 2

## 2018-01-17 MED ORDER — ROSUVASTATIN CALCIUM 20 MG PO TABS: 20.0000 mg | ORAL_TABLET | Freq: Every day | ORAL | Status: DC

## 2018-01-17 MED ORDER — HEPARIN SODIUM (PORCINE) 1000 UNIT/ML IJ SOLN
INTRAMUSCULAR | Status: DC | PRN
  Administered 2018-01-17: 4000 [IU] via INTRAVENOUS
  Administered 2018-01-17: 5000 [IU] via INTRAVENOUS
  Administered 2018-01-17: 4000 [IU] via INTRAVENOUS
  Administered 2018-01-17: 3000 [IU] via INTRAVENOUS

## 2018-01-17 MED ORDER — CLOPIDOGREL BISULFATE 75 MG PO TABS: 75.0000 mg | ORAL_TABLET | Freq: Every day | ORAL | Status: DC

## 2018-01-17 MED ORDER — LABETALOL HCL 5 MG/ML IV SOLN: 10.0000 mg | INTRAVENOUS | Status: DC | PRN

## 2018-01-17 MED ORDER — HEPARIN SODIUM (PORCINE) 1000 UNIT/ML IJ SOLN
INTRAMUSCULAR | Status: AC
  Filled 2018-01-17: qty 1

## 2018-01-17 MED ORDER — LIDOCAINE HCL (PF) 1 % IJ SOLN
INTRAMUSCULAR | Status: AC
  Filled 2018-01-17: qty 30

## 2018-01-17 MED ORDER — SODIUM CHLORIDE 0.9 % WEIGHT BASED INFUSION
3.0000 mL/kg/h | INTRAVENOUS | Status: AC
  Administered 2018-01-17: 3 mL/kg/h via INTRAVENOUS

## 2018-01-17 MED ORDER — ADENOSINE (DIAGNOSTIC) 140MCG/KG/MIN
INTRAVENOUS | Status: DC | PRN
  Administered 2018-01-17: 140 ug/kg/min via INTRAVENOUS

## 2018-01-17 MED ORDER — CLOPIDOGREL BISULFATE 300 MG PO TABS
ORAL_TABLET | ORAL | Status: DC | PRN
  Administered 2018-01-17: 600 mg via ORAL

## 2018-01-17 MED ORDER — ADENOSINE 12 MG/4ML IV SOLN
INTRAVENOUS | Status: AC
  Filled 2018-01-17: qty 16

## 2018-01-17 MED FILL — CLOPIDOGREL 75 MG TABLET: 75 | 90 days supply | Qty: 90 | Fill #0 | Status: TO

## 2018-01-17 SURGICAL SUPPLY — 16 items
BALLN SAPPHIRE 2.5X12 (BALLOONS) ×2
BALLOON SAPPHIRE 2.5X12 (BALLOONS) IMPLANT
CATH OPTITORQUE TIG 4.0 5F (CATHETERS) ×1 IMPLANT
CATH VISTA GUIDE 6FR XBLAD3.5 (CATHETERS) ×1 IMPLANT
DEVICE RAD COMP TR BAND LRG (VASCULAR PRODUCTS) ×1 IMPLANT
GLIDESHEATH SLEND SS 6F .021 (SHEATH) ×1 IMPLANT
GUIDEWIRE INQWIRE 1.5J.035X260 (WIRE) IMPLANT
GUIDEWIRE PRESSURE COMET II (WIRE) ×1 IMPLANT
INQWIRE 1.5J .035X260CM (WIRE) ×2
KIT ENCORE 26 ADVANTAGE (KITS) ×1 IMPLANT
KIT ESSENTIALS PG (KITS) ×1 IMPLANT
KIT HEART LEFT (KITS) ×2 IMPLANT
PACK CARDIAC CATHETERIZATION (CUSTOM PROCEDURE TRAY) ×2 IMPLANT
STENT SIERRA 2.75 X 15 MM (Permanent Stent) ×1 IMPLANT
TRANSDUCER W/STOPCOCK (MISCELLANEOUS) ×2 IMPLANT
TUBING CIL FLEX 10 FLL-RA (TUBING) ×2 IMPLANT

## 2018-01-17 NOTE — Discharge Summary (Signed)
Discharge Summary    Patient ID: [name] [name]. MRN: [id number]; DOB: August 23, 1945  Admit date: 01/17/2018 Discharge date: 01/17/2018  Primary Care Provider: [name], MD  Primary Cardiologist: Jenkins Rouge, MD   Discharge Diagnoses    Principal Problem:   Abnormal cardiac CT angiography Active Problems:   Hyperlipidemia with target LDL less than 70   Essential hypertension   Angina, class II ([place])  Allergies No Known Allergies  Diagnostic Studies/Procedures    Cardiac catheterization 01/17/18:   CULPRIT LESION: Prox Cx to Mid Cx lesion is 70% stenosed.  A drug-eluting stent was successfully placed using a STENT SIERRA 2.75 X 15 MM -postdilated to 3.0 mm with stent balloon  Post intervention, there is a 0% residual stenosis.  Mid Cx lesion is 35% stenosed.  Normal LVEDP with no aortic stenosis.   SUMMARY  Significant single-vessel disease as predicted by CT, mid circumflex 70% stenosis followed by a more distal 40% stenosis.  FFR 0.78.  -Successful PCI with Xience DES 2.75 mm x 15 mm (3.0 mm)  Otherwise minimal CAD.  Normal LVEDP.  Normal EF by echo.   RECOMMENDATIONS  Uncomplicated single-vessel PCI with no significant chest pain.  Should be fine for same-day discharge.  Increased statin dose to 20 mg daily  Not on beta-blocker because of bradycardia, pending blood pressure in the outpatient setting, consider calcium channel blocker for antianginal effect.  Recommend uninterrupted dual antiplatelet therapy with Aspirin 81mg  daily and Clopidogrel 75mg  daily for a minimum of 6 months (stable ischemic heart disease - Class I recommendation).   History of Present Illness     [name] [name]. is a 72 y.o. male with hx of HTN and HLD who presented to the office on 01/16/18 for cardiac cath discussion following abnormal coronary CT.  Pt initially establish care with [name] 12/21/17 (remotely by Dr. Verl Blalock >5years ago) for dyspnea while swimming  which was noted to have recently worsened with associated chest pressure. He reported  that he regularly swims without complication. He denied syncope or dizziness, orthopnea, PND, LE edema, melena or blood in his stool or urine. He is currnently wearing a 30-day monitor and will finish on 01/26/18. Echocardiogram from 12/28/17 showed normal LVEF.   Coronary CT performed 01/10/18 with results below: IMPRESSION: 1. Mild aortic root dilatation 4.0 cm  2. Significant CAD possibly obstructive in proximal RCA, mid circumflex and OM1 Study sent for FFR CT  3. Calcium score 318 which is 53 th percentile for age and sex  FFR CT suggested obstructive in circumflex with plans for cath which was scheduled for 01/17/18.   Hospital Course   On 01/17/18 the patient presented for cardiac cath which showed pLCX to [facility] lesion which was 70% stenosed with successful DES/PCI placement. Plans are to increase statin dose to 20 mg daily. Not currently on beta-blocker secondary to bradycardia, pending blood pressure in the outpatient setting and will need consider calcium channel blocker for antianginal effect.Other recommendations are for uninterrupted dual antiplatelet therapy with Aspirin 81mg  daily and Clopidogrel 75mg  daily for a minimum of 6 months  Cath site unremarkable. Follow up appointment has been made. Plans are for d/c from short stay if site remains stable with no recurrent chest pain and vitals are okay.   Consultants: None   The patient has been seen and examined by Dr. Ellyn Hack who feels that he is stable and ready for discharge today, 01/17/18.  _____________  Discharge Vitals Blood pressure 127/64, pulse Marland Kitchen)  54, temperature 98.6 F (37 C), temperature source Oral, resp. rate 16, height 5\' 5"  (1.651 m), weight 86.2 kg, SpO2 98 %.  Filed Weights   01/17/18 0934  Weight: 86.2 kg   Labs & Radiologic Studies    CBC Recent Labs    01/16/18 0953  WBC 6.7  HGB 14.9  HCT 43.9  MCV 88    PLT 767   Basic Metabolic Panel Recent Labs    01/16/18 0953  NA 141  K 4.2  CL 107*  CO2 24  GLUCOSE 92  BUN 20  CREATININE 1.27  CALCIUM 9.9   Ct Coronary Morph W/cta Cor W/score W/ca W/cm &/or Wo/cm  Addendum Date: 01/11/2018   ADDENDUM REPORT: 01/11/2018 09:00 EXAM: OVER-READ INTERPRETATION  CT CHEST The following report is an over-read performed by radiologist Dr. Estanislado Pandy [facility] on 01/11/2018. This over-read does not include interpretation of cardiac or coronary anatomy or pathology. The CTA interpretation by the cardiologist is attached. COMPARISON:  PET-CT 05/10/2016 FINDINGS: Mid ascending thoracic aorta measures up to 3.8 cm. Atherosclerotic disease in the visualized thoracic aorta without dissection. Minimal pericardial fluid. No large central filling defects in the pulmonary arteries. Visualized mediastinal and hilar structures are unremarkable without lymph node enlargement. Images of the upper abdomen are unremarkable. Dependent atelectasis in the lungs. Otherwise, the lungs are clear. No large pleural effusions. Bone structures are unremarkable. IMPRESSION: Negative over-read examination. Electronically Signed   By: Markus Daft M.D.   On: 01/11/2018 09:00   Result Date: 01/11/2018 CLINICAL DATA:  Chest pain EXAM: Cardiac CTA MEDICATIONS: Sub lingual nitro. 4mg  and lopressor 5mg  TECHNIQUE: The patient was scanned on a Siemens Force 209 slice scanner. Gantry rotation speed was 250 msecs. Collimation was .6 mm. A 100 kV prospective scan was triggered in the ascending thoracic aorta at 140 HU's Full mA was used between 35% and 75% of the R-R interval. Average HR during the scan was 57 bpm. The 3D data set was interpreted on a dedicated work station using MPR, MIP and VRT modes. A total of 80cc of contrast was used. FINDINGS: Non-cardiac: See separate report from [facility]. No significant findings on limited lung and soft tissue windows. Calcium Score:  Calcium noted in all 3 major epicardial coronary vessels Coronary Arteries: Right dominant with no anomalies LM: Normal LAD: Less than 30 % calcific plaque in proximal vessel D1: Less than 30% calcific plaque in mid vessel D2: Normal Circumflex: Less than 30% calcific plaque proximally 50-75% calcific plaque in mid vessel OM1: 50-75% calcific plaque OM2: Normal RCA: 50-75% proximal calcific stenosis PDA: Normal PLA: Normal IMPRESSION: 1.  Mild aortic root dilatation 4.0 cm 2. Significant CAD possibly obstructive in proximal RCA, mid circumflex and OM1 Study sent for FFR CT 3.  Calcium score 318 which is 21 th percentile for age and sex Jenkins Rouge Electronically Signed: By: Jenkins Rouge M.D. On: 01/10/2018 16:06   Ct Coronary Fractional Flow Reserve Fluid Analysis  Result Date: 01/11/2018 CLINICAL DATA:  CAD EXAM: FFR CT TECHNIQUE: The best systolic and diastolic phases of the patients gated cardiac CTA were sent to [facility] for hemodynamic analysis FINDINGS: FFR CT positive in the mid and distal circumflex and OM one branches at .69 Values are normal in RCA lowest .90 in mid vessel and normal in LAD IMPRESSION: Obstructive CAD in mid / distal circumflex and OM1 patient will be referred for cath [facility] Electronically Signed   By: Eligha Bridegroom.D.  On: 01/11/2018 12:05   Disposition   Pt is being discharged home today in good condition.  Follow-up Plans & Appointments   Follow-up Information    Burtis Junes, NP Follow up on 01/30/2018.   Specialties:  Nurse Practitioner, Interventional Cardiology, Cardiology, Radiology Why:  Your follow up appointment will be on 01/30/18 at 11am. Please arrive by 1045am.  Contact information: [place]. [address] [place] [address] [phone]          Discharge Instructions    Amb Referral to Cardiac Rehabilitation   Complete by:  As directed    Diagnosis:  Coronary Stents     Discharge Medications   Allergies as of  01/17/2018   No Known Allergies     Medication List    STOP taking these medications   naproxen sodium 220 MG tablet Commonly known as:  ALEVE     TAKE these medications   aspirin EC 81 MG tablet Take 81 mg by mouth every evening.   bimatoprost 0.01 % Soln Commonly known as:  LUMIGAN Place 1 drop into both eyes at bedtime.   clopidogrel 75 MG tablet Commonly known as:  PLAVIX Take 1 tablet (75 mg total) by mouth daily with breakfast. Start taking on:  01/18/2018   CoQ-10 100 MG Caps Take 100 mg by mouth every evening.   dorzolamide-timolol 22.3-6.8 MG/ML ophthalmic solution Commonly known as:  COSOPT Place 1 drop into the right eye 2 (two) times daily.   fenofibrate 160 MG tablet Take 1 tablet (160 mg total) by mouth daily. What changed:  when to take this   lisinopril 20 MG tablet Commonly known as:  PRINIVIL,ZESTRIL Take 1 tablet (20 mg total) by mouth daily.   magnesium oxide 400 MG tablet Commonly known as:  MAG-OX Take 400 mg by mouth every evening.   multivitamin with minerals tablet Take 1 tablet by mouth 2 (two) times a week. Sundays and wednesdays   nitroGLYCERIN 0.4 MG SL tablet Commonly known as:  NITROSTAT Place 1 tablet (0.4 mg total) under the tongue every 5 (five) minutes as needed. What changed:  reasons to take this   rosuvastatin 20 MG tablet Commonly known as:  CRESTOR Take 1 tablet (20 mg total) by mouth daily. What changed:    how much to take  when to take this   tadalafil 20 MG tablet Commonly known as:  ADCIRCA/CIALIS Take 1 tablet (20 mg total) by mouth daily as needed. What changed:  reasons to take this        Acute coronary syndrome (MI, NSTEMI, STEMI, etc) this admission?: No.    Outstanding Labs/Studies   None   Duration of Discharge Encounter   Greater than 30 minutes including physician time.  Signed, Kathyrn Drown, NP 01/17/2018, 3:57 PM

## 2018-01-17 NOTE — Progress Notes (Signed)
CARDIAC REHAB PHASE I   Pt and wife educated on importance of Plavix, ASA, statin and NTG. Pt given stent card and heart healthy diet. Reviewed restrictions and exercise guidelines. Will refer to CRP II [place]. Pt for d/c later this evening.  [place], RN BSN 01/17/2018 2:31 PM

## 2018-01-17 NOTE — Discharge Instructions (Signed)

## 2018-01-17 NOTE — Interval H&P Note (Signed)
History and Physical Interval Note:  01/17/2018 11:29 AM  [name] [name].  has presented today for surgery, with the diagnosis of abnormal coronary CT angiogram with class II angina.   The various methods of treatment have been discussed with the patient and family. After consideration of risks, benefits and other options for treatment, the patient has consented to  Procedure(s): LEFT HEART CATH AND CORONARY ANGIOGRAPHY (N/A) with possible PERCUTANEOUS CORONARY INTERVENTION as a surgical intervention .   The patient's history has been reviewed, patient examined, no change in status, stable for surgery.  I have reviewed the patient's chart and labs.  Questions were answered to the patient's satisfaction.    Cath Lab Visit (complete for each Cath Lab visit)  Clinical Evaluation Leading to the Procedure:   ACS: No.  Non-ACS:    Anginal Classification: CCS II  Anti-ischemic medical therapy: No Therapy  Non-Invasive Test Results: High-risk stress test findings: cardiac mortality >3%/year  Prior CABG: No previous CABG   Glenetta Hew

## 2018-01-18 ENCOUNTER — Encounter (HOSPITAL_COMMUNITY): Payer: Self-pay | Admitting: Cardiology

## 2018-01-19 ENCOUNTER — Telehealth (HOSPITAL_COMMUNITY): Payer: Self-pay

## 2018-01-19 MED FILL — Nitroglycerin IV Soln 100 MCG/ML in D5W: INTRA_ARTERIAL | Qty: 10 | Status: AC

## 2018-01-19 NOTE — Telephone Encounter (Signed)
Pt's insurance is active and benefits verified through Medicare A/B.  No Co-pay, deductible amount of $185.00/$185.00, out of pocket amount $0.00/$0.00, 20% co-insurance, and no pre-authorization is required. Passport/reference # - [id number]  Pt's insurance is active and benefits verified through [employer]. No co-pay, no deductible, no out of pocket, no co-insurance, and no pre-authorization is required. Passport/reference [id number]

## 2018-01-19 NOTE — Telephone Encounter (Signed)
Called and spoke with patient in regards to Cardiac Rehab - Pt stated he definitely interested in participating but would like to take some time to figure out his schedule. Explained to patient we are currently scheduling for January at this time. Pt would like for [place] to follow up in a week. Verbalized understanding to patient.

## 2018-01-26 ENCOUNTER — Telehealth (HOSPITAL_COMMUNITY): Payer: Self-pay

## 2018-01-26 NOTE — Telephone Encounter (Signed)
Called to follow up with patient in regards to cardiac rehab - scheduled orientation on 03/22/18 at 8:15am. Pt will attend the 2:45pm exc class. Mailed packet.

## 2018-01-30 ENCOUNTER — Encounter: Payer: Self-pay | Admitting: Nurse Practitioner

## 2018-01-30 ENCOUNTER — Ambulatory Visit (INDEPENDENT_AMBULATORY_CARE_PROVIDER_SITE_OTHER): Payer: Medicare Other | Admitting: Nurse Practitioner

## 2018-01-30 VITALS — BP 142/88 | HR 59 | Ht 66.0 in | Wt 193.8 lb

## 2018-01-30 DIAGNOSIS — Z79899 Other long term (current) drug therapy: Secondary | ICD-10-CM

## 2018-01-30 DIAGNOSIS — I209 Angina pectoris, unspecified: Secondary | ICD-10-CM

## 2018-01-30 DIAGNOSIS — Z955 Presence of coronary angioplasty implant and graft: Secondary | ICD-10-CM | POA: Diagnosis not present

## 2018-01-30 MED ORDER — ROSUVASTATIN CALCIUM 20 MG PO TABS: 20.0000 mg | ORAL_TABLET | Freq: Every day | ORAL | 3 refills | Status: DC

## 2018-01-30 NOTE — Patient Instructions (Addendum)
We will be checking the following labs today - BMET & CBC  If you have labs (blood work) drawn today and your tests are completely normal, you will receive your results only by: Marland Kitchen MyChart Message (if you have MyChart) OR . A paper copy in the mail If you have any lab test that is abnormal or we need to change your treatment, we will call you to review the results.   Medication Instructions:    Continue with your current medicines.   I have given you a RX for the Crestor to take to the [place]   If you need a refill on your cardiac medications before your next appointment, please call your pharmacy.     Testing/Procedures To Be Arranged:  N/A  Follow-Up:   See [name] in about 2 months with fasting labs    At [facility], you and your health needs are our priority.  As part of our continuing mission to provide you with exceptional heart care, we have created designated Provider Care Teams.  These Care Teams include your primary Cardiologist (physician) and Advanced Practice Providers (APPs -  Physician Assistants and Nurse Practitioners) who all work together to provide you with the care you need, when you need it.  Special Instructions:  . Hold on swimming until your arm is completely healed . I will send a note to Cardiac Rehab.  Marland Kitchen Keep a BP diary over the next 2 weeks and call with your readings  Call the [place] office at [phone] if you have any questions, problems or concerns.

## 2018-01-30 NOTE — Progress Notes (Signed)
CARDIOLOGY OFFICE NOTE  Date:  01/30/2018    [name] [name]. Date of Birth: 1945-04-27 Medical Record #[id number]  PCP:  [name], MD  Cardiologist:  Johnsie Cancel  Chief Complaint  Patient presents with  . Coronary Artery Disease    Post cath/PCI visit - seen for [name]    History of Present Illness: [name] [name]. is a 72 y.o. male who presents today for a post cath visit. Seen for [name].   He has a history of HTN and HLD.   Seen here in October by [name] with DOE and palpitations. Has had normal EF by echo. Has had event monitor - I do not see the final report for the event monitor. Had coronary CT showing CAD and also with mildly dilated aorta. He was referred for cardiac cath. This showed LCX disease - he underwent DES/PCI placement. Placed on DAPT for minimum of 6 months. No beta blocker due to bradycardia. Statin dose was increased.   Comes in today. Here with his wife. Finished his monitor 01/26/2018. Doing well. Needs paper RX for his statin. Asking about swimming. No more chest pressure or shortness of breath but has not returned to swimming - he is walking on the treadmill without any issue. He checks his BP right when he takes his medicines - running right at 140 - does not otherwise check. He is going to the [place] next week. He does have a small knot at his cath site.   Past Medical History:  Diagnosis Date  . ABNORMAL EKG 10/07/2008  . ADVEF, DRUG/MED/BIOL SUBST, OTHER DRUG NOS 09/26/2006  . Allergy    seasonal  . Cataract    left eye-surgery 07-2015  . DEGENERATIVE JOINT DISEASE, MODERATE 04/13/2007  . Diabetes mellitus    type 2, diet controlled   . DIVERTICULOSIS, COLON 04/13/2007  . ELEVATED PROSTATE SPECIFIC ANTIGEN 04/14/2007  . Glaucoma    sees [name]   . GOUT 05/16/2007  . HYPERLIPIDEMIA 09/26/2006  . HYPERTENSION 04/13/2007  . JOINT EFFUSION, KNEE 06/15/2009  . Melanoma [facility])    sees [name]   . PROSTATE CANCER 10/05/2009   sees  [name]  . Renal insufficiency    RESOLVED PER PATIENT    Past Surgical History:  Procedure Laterality Date  . ACROMIOPLASTY Right 05-06-14   per [name]   . BUNIONECTOMY     bilateral  . CATARACT EXTRACTION Left    per [name]   . COLONOSCOPY  07/10/2015   per [name], benign polyp, repeat in 10 yrs   . CORONARY STENT INTERVENTION N/A 01/17/2018   Procedure: CORONARY STENT INTERVENTION;  Surgeon: [name], MD;  Location: [place] [facility];  Service: Cardiovascular;  Laterality: N/A;  . CYSTECTOMY     benign from hand  . GLAUCOMA SURGERY Bilateral 2014   . INSERTION OF MESH N/A 01/10/2013   Procedure: INSERTION OF MESH;  Surgeon: Earnstine Regal, MD;  Location: [place];  Service: General;  Laterality: N/A;  . KNEE ARTHROSCOPY Right Jan. 2013   right knee, per [name]   . KNEE ARTHROSCOPY Left 01-14-15   per [name]   . LEFT HEART CATH AND CORONARY ANGIOGRAPHY N/A 01/17/2018   Procedure: LEFT HEART CATH AND CORONARY ANGIOGRAPHY;  Surgeon: [name], MD;  Location: [place] [facility];  Service: Cardiovascular;  Laterality: N/A;  . MELANOMA EXCISION  2012   per Dr.  [name]  . PROSTATECTOMY     per [name]  . TONSILLECTOMY    . UMBILICAL HERNIA REPAIR  11/01/2011   Procedure: HERNIA REPAIR UMBILICAL ADULT;  Surgeon: Earnstine Regal, MD;  Location: [place];  Service: General;  Laterality: N/A;  Repair umbilical hernia with mesh patch  . VASECTOMY    . VENTRAL HERNIA REPAIR  01/10/2013   with mesh     [name]  . VENTRAL HERNIA REPAIR N/A 01/10/2013   Procedure: HERNIA REPAIR VENTRAL ADULT ;  Surgeon: Earnstine Regal, MD;  Location: [place];  Service: General;  Laterality: N/A;     Medications: Current Meds  Medication Sig  . aspirin EC 81 MG tablet Take 81 mg by mouth every evening.   . bimatoprost (LUMIGAN) 0.01 % SOLN Place 1 drop into both eyes at bedtime.  . clopidogrel (PLAVIX) 75 MG tablet Take 1 tablet (75 mg total) by mouth  daily with breakfast.  . Coenzyme Q10 (COQ-10) 100 MG CAPS Take 100 mg by mouth every evening.   . dorzolamide-timolol (COSOPT) 22.3-6.8 MG/ML ophthalmic solution Place 1 drop into the right eye 2 (two) times daily.  . fenofibrate 160 MG tablet Take 1 tablet (160 mg total) by mouth daily. (Patient taking differently: Take 160 mg by mouth every evening. )  . lisinopril (PRINIVIL,ZESTRIL) 20 MG tablet Take 1 tablet (20 mg total) by mouth daily.  . magnesium oxide (MAG-OX) 400 MG tablet Take 400 mg by mouth every evening.   . Multiple Vitamins-Minerals (MULTIVITAMIN WITH MINERALS) tablet Take 1 tablet by mouth 2 (two) times a week. Sundays and wednesdays  . nitroGLYCERIN (NITROSTAT) 0.4 MG SL tablet Place 1 tablet (0.4 mg total) under the tongue every 5 (five) minutes as needed. (Patient taking differently: Place 0.4 mg under the tongue every 5 (five) minutes as needed for chest pain. )  . rosuvastatin (CRESTOR) 20 MG tablet Take 1 tablet (20 mg total) by mouth daily.  . tadalafil (CIALIS) 20 MG tablet Take 1 tablet (20 mg total) by mouth daily as needed. (Patient taking differently: Take 20 mg by mouth daily as needed for erectile dysfunction. )  . [DISCONTINUED] rosuvastatin (CRESTOR) 20 MG tablet Take 1 tablet (20 mg total) by mouth daily. (Patient taking differently: Take 10 mg by mouth every evening. )     Allergies: No Known Allergies  Social History: The patient  reports that he quit smoking about 45 years ago. He started smoking about 54 years ago. He has a 9.00 pack-year smoking history. He has never used smokeless tobacco. He reports that he does not drink alcohol or use drugs.   Family History: The patient's family history includes Heart failure in his father; Stroke in his mother.   Review of Systems: Please see the history of present illness.   Otherwise, the review of systems is positive for none.   All other systems are reviewed and negative.   Physical Exam: VS:  BP (!) 142/88  (BP Location: Left Arm, Patient Position: Sitting, Cuff Size: Normal)   Pulse (!) 59   Ht 5\' 6"  (1.676 m)   Wt 193 lb 12.8 oz (87.9 kg)   SpO2 98% Comment: at rest  BMI 31.28 kg/m  .  BMI Body mass index is 31.28 kg/m.  Wt Readings from Last 3 Encounters:  01/30/18 193 lb 12.8 oz (87.9 kg)  01/17/18 190 lb (86.2 kg)  01/16/18 194 lb (88 kg)    General: Pleasant. Well  developed, well nourished and in no acute distress.   HEENT: Normal.  Neck: Supple, no JVD, carotid bruits, or masses noted.  Cardiac: Regular rate and rhythm. No murmurs, rubs, or gallops. No edema.  Respiratory:  Lungs are clear to auscultation bilaterally with normal work of breathing.  GI: Soft and nontender.  MS: No deformity or atrophy. Gait and ROM intact.  Skin: Warm and dry. Color is normal.  Neuro:  Strength and sensation are intact and no gross focal deficits noted.  Psych: Alert, appropriate and with normal affect.  His right arm has residual bruising. There is a small dime sized raised knot at the cath site. He has excellent radial pulse noted.    LABORATORY DATA:  EKG:  EKG is not ordered today.  Lab Results  Component Value Date   WBC 6.7 01/16/2018   HGB 14.9 01/16/2018   HCT 43.9 01/16/2018   PLT 274 01/16/2018   GLUCOSE 92 01/16/2018   CHOL 163 11/07/2017   TRIG 165.0 (H) 11/07/2017   HDL 43.10 11/07/2017   LDLDIRECT 82.1 04/07/2011   LDLCALC 87 11/07/2017   ALT 17 11/07/2017   AST 15 11/07/2017   NA 141 01/16/2018   K 4.2 01/16/2018   CL 107 (H) 01/16/2018   CREATININE 1.27 01/16/2018   BUN 20 01/16/2018   CO2 24 01/16/2018   TSH 3.11 11/07/2017   PSA 0.38 11/06/2015   HGBA1C 6.0 11/07/2017     BNP (last 3 results) No results for input(s): BNP in the last 8760 hours.  ProBNP (last 3 results) No results for input(s): PROBNP in the last 8760 hours.   Other Studies Reviewed Today:  CORONARY STENT INTERVENTION 01/2018  LEFT HEART CATH AND CORONARY ANGIOGRAPHY    Conclusion     CULPRIT LESION: Prox Cx to Mid Cx lesion is 70% stenosed.  A drug-eluting stent was successfully placed using a STENT SIERRA 2.75 X 15 MM -postdilated to 3.0 mm with stent balloon  Post intervention, there is a 0% residual stenosis.  Mid Cx lesion is 35% stenosed.  Normal LVEDP with no aortic stenosis.   SUMMARY  Significant single-vessel disease as predicted by CT, mid circumflex 70% stenosis followed by a more distal 40% stenosis.  FFR 0.78.  --Successful PCI with Xience DES 2.75 mm x 15 mm (3.0 mm)  Otherwise minimal CAD.  Normal LVEDP.  Normal EF by echo.   RECOMMENDATIONS  Uncomplicated single-vessel PCI with no significant chest pain.  Should be fine for same-day discharge.  Increased statin dose to 20 mg daily  Not on beta-blocker because of bradycardia, pending blood pressure in the outpatient setting, consider calcium channel blocker for antianginal effect.  Recommend uninterrupted dual antiplatelet therapy with Aspirin 81mg  daily and Clopidogrel 75mg  daily for a minimum of 6 months (stable ischemic heart disease - Class I recommendation).       Echocardiogram: 12/28/17 Study Conclusions  - Left ventricle: The cavity size was normal. Wall thickness was normal. Systolic function was normal. The estimated ejection fraction was in the range of 60% to 65%. Wall motion was normal; there were no regional wall motion abnormalities. Doppler parameters are consistent with mildly elevated ventricular filling pressure (E/e&' medial = 15). - Aortic valve: Trileaflet; normal thickness leaflets. Transvalvular velocity was within the normal range. There was no stenosis. There was trivial regurgitation. Mean gradient (S): 5 mm Hg. - Mitral valve: Transvalvular velocity was within the normal range. There was no evidence for stenosis. There was trivial regurgitation. -  Left atrium: The atrium was normal in size. - Right  ventricle: The cavity size was normal. Wall thickness was normal. Systolic function was normal. RV systolic pressure (S, est): 28 mm Hg. - Right atrium: The atrium was normal in size. - Tricuspid valve: There was trivial regurgitation. - Inferior vena cava: The vessel was normal in size. - Pericardium, extracardiac: There was no pericardial effusion.   Assessment/Plan: 1. DOE/chest pressure with abnormal coronary CT - now s/p cath with PCI to the LCX - he is on DAPT - he is doing well. Lots of questions answered today. He is wanting to do rehab. Will hold on swimming until his arm is totally healed. Lab today.   2. HLD - on higher dose of statin - recheck lab on return - goal LDL is now 70.   3. HTN - he is going to monitor at other times - if remains elevated - would start low dose Norvac  4. Mildly dilated aorta - will need to follow.   5. Prior bradycardia - has just finished his monitor - those results are pending. He has no symptoms at this time - he is not on any AV nodal blocking agents.    Current medicines are reviewed with the patient today.  The patient does not have concerns regarding medicines other than what has been noted above.  The following changes have been made:  See above.  Labs/ tests ordered today include:    Orders Placed This Encounter  Procedures  . Basic metabolic panel  . CBC     Disposition:   FU with [name] in a couple of months - recheck lab on return visit.   Patient is agreeable to this plan and will call if any problems develop in the interim.   SignedTruitt Merle, NP  01/30/2018 11:47 AM  [facility] [address] [place], [place]  [address] Phone: [phone] Fax: [phone]

## 2018-01-31 ENCOUNTER — Other Ambulatory Visit: Payer: Self-pay | Admitting: *Deleted

## 2018-01-31 DIAGNOSIS — Z79899 Other long term (current) drug therapy: Secondary | ICD-10-CM

## 2018-01-31 LAB — BASIC METABOLIC PANEL
BUN/Creatinine Ratio: 17 (ref 10–24)
BUN: 26 mg/dL (ref 8–27)
CO2: 17 mmol/L — ABNORMAL LOW (ref 20–29)
Calcium: 9.8 mg/dL (ref 8.6–10.2)
Chloride: 109 mmol/L — ABNORMAL HIGH (ref 96–106)
Creatinine, Ser: 1.5 mg/dL — ABNORMAL HIGH (ref 0.76–1.27)
GFR calc Af Amer: 54 mL/min/{1.73_m2} — ABNORMAL LOW (ref >59)
GFR calc non Af Amer: 46 mL/min/{1.73_m2} — ABNORMAL LOW (ref >59)
Glucose: 83 mg/dL (ref 65–99)
Potassium: 4.5 mmol/L (ref 3.5–5.2)
Sodium: 143 mmol/L (ref 134–144)

## 2018-01-31 LAB — CBC
Hematocrit: 43 % (ref 37.5–51.0)
Hemoglobin: 14.8 g/dL (ref 13.0–17.7)
MCH: 30 pg (ref 26.6–33.0)
MCHC: 34.4 g/dL (ref 31.5–35.7)
MCV: 87 fL (ref 79–97)
Platelets: 227 10*3/uL (ref 150–450)
RBC: 4.94 x10E6/uL (ref 4.14–5.80)
RDW: 13.1 % (ref 12.3–15.4)
WBC: 7.8 10*3/uL (ref 3.4–10.8)

## 2018-02-14 ENCOUNTER — Telehealth: Payer: Self-pay

## 2018-02-14 NOTE — Telephone Encounter (Signed)
Spoke to the patient this morning and informed him of Lori's recommendation.  He verbalized understanding and was thankful for the call.

## 2018-02-15 MED ORDER — ISOSORBIDE MONONITRATE ER 30 MG PO TB24: 15.0000 mg | ORAL_TABLET | Freq: Every day | ORAL | 3 refills | Status: DC

## 2018-02-15 NOTE — Telephone Encounter (Signed)
Spoke with the patient, advised that he is experiencing deconditioning from reduced exercise and that he should slowly work back up to his old activity level. He stated he has had some incidences of burping. He denies heartburn. Sending to Truitt Merle for reference.

## 2018-02-15 NOTE — Telephone Encounter (Signed)
The patient stated his symptoms are similar to before, the pain is a little lower than before. He was hesitant to take anymore medication. Sending to [facility] for recommendations.

## 2018-02-15 NOTE — Telephone Encounter (Signed)
Spoke with the patient, he accepted taking Imdur 15 mg, daily. He also stated he would call our office if his symptoms persist or worsen. He is scheduled on 12/19 with Jory Sims for follow up. He had no further questions.

## 2018-02-19 ENCOUNTER — Ambulatory Visit: Payer: Medicare Other | Admitting: Cardiovascular Disease

## 2018-02-21 ENCOUNTER — Other Ambulatory Visit: Payer: Medicare Other

## 2018-02-21 DIAGNOSIS — Z79899 Other long term (current) drug therapy: Secondary | ICD-10-CM | POA: Diagnosis not present

## 2018-02-21 LAB — BASIC METABOLIC PANEL
BUN/Creatinine Ratio: 10 (ref 10–24)
BUN: 14 mg/dL (ref 8–27)
CO2: 22 mmol/L (ref 20–29)
Calcium: 9.8 mg/dL (ref 8.6–10.2)
Chloride: 107 mmol/L — ABNORMAL HIGH (ref 96–106)
Creatinine, Ser: 1.43 mg/dL — ABNORMAL HIGH (ref 0.76–1.27)
GFR calc Af Amer: 57 mL/min/{1.73_m2} — ABNORMAL LOW (ref >59)
GFR calc non Af Amer: 49 mL/min/{1.73_m2} — ABNORMAL LOW (ref >59)
Glucose: 93 mg/dL (ref 65–99)
Potassium: 4.6 mmol/L (ref 3.5–5.2)
Sodium: 143 mmol/L (ref 134–144)

## 2018-02-21 NOTE — Progress Notes (Signed)
Cardiology Office Note   Date:  02/22/2018   ID:  [name] [name]., DOB 11-Apr-1945, MRN [id number]  PCP:  [name] Morale, MD  Cardiologist:  [name]  Chief Complaint  Patient presents with  . Chest Pain  . Coronary Artery Disease     History of Present Illness: [name] [name]. is a 72 y.o. male who presents for ongoing assessment and management of CAD found initially on cardiac CT. Cardiac cath 01/17/2018 revealed 70% stenosis of the mid left Cx followed by a more distal 40% stenosis. FFR 0.78. He required PCI with DES. There was minimal CAD elsewhere. He was placed on DAPT with ASA and Plavix. Other hx includes HL, Type II Diabetes, HTN.   Last seen in the office by Truitt Merle, NP, and was stable from a cardiac standpoint. Rechecked L/L with goal of LDL < 70. He had slightly elevated BP but watching this. If remained elevated recommendations for low dose amlodipine.  He called our office on 02/15/2018 with complaints of recurrent chest pain, not as severe as pain prior to PCI in November. He was started on low dose isosorbide and is here for follow up and response to medication.   He states that he swims 30 minutes 3 -4 times a week and noticed that his breathing status began to worsen with this. Prior to stent placement,he has dyspnea and chest pressure. Chest pressure is not an issue now. Ager taking the isosorbide 15 mg he noticed an improvement in hs breathing status while swimming after about 5-10 minutes (after th first lap). He continues have DOE waling to [employer] which resolves with rest. He is also noticing myalgia pain since going up on Crestor to 20 mg from 10 mg daily.   Past Medical History:  Diagnosis Date  . ABNORMAL EKG 10/07/2008  . ADVEF, DRUG/MED/BIOL SUBST, OTHER DRUG NOS 09/26/2006  . Allergy    seasonal  . Cataract    left eye-surgery 07-2015  . DEGENERATIVE JOINT DISEASE, MODERATE 04/13/2007  . Diabetes mellitus    type 2, diet controlled   .  DIVERTICULOSIS, COLON 04/13/2007  . ELEVATED PROSTATE SPECIFIC ANTIGEN 04/14/2007  . Glaucoma    sees [name]   . GOUT 05/16/2007  . HYPERLIPIDEMIA 09/26/2006  . HYPERTENSION 04/13/2007  . JOINT EFFUSION, KNEE 06/15/2009  . Melanoma [facility])    sees [name]   . PROSTATE CANCER 10/05/2009   sees [name]  . Renal insufficiency    RESOLVED PER PATIENT    Past Surgical History:  Procedure Laterality Date  . ACROMIOPLASTY Right 05-06-14   per [name]   . BUNIONECTOMY     bilateral  . CATARACT EXTRACTION Left    per [name]   . COLONOSCOPY  07/10/2015   per [name], benign polyp, repeat in 10 yrs   . CORONARY STENT INTERVENTION N/A 01/17/2018   Procedure: CORONARY STENT INTERVENTION;  Surgeon: [name], MD;  Location: [place] [facility];  Service: Cardiovascular;  Laterality: N/A;  . CYSTECTOMY     benign from hand  . GLAUCOMA SURGERY Bilateral 2014   . INSERTION OF MESH N/A 01/10/2013   Procedure: INSERTION OF MESH;  Surgeon: Earnstine Regal, MD;  Location: [place];  Service: General;  Laterality: N/A;  . KNEE ARTHROSCOPY Right Jan. 2013   right knee, per [name]   . KNEE ARTHROSCOPY Left 01-14-15   per [name]   . LEFT HEART CATH  AND CORONARY ANGIOGRAPHY N/A 01/17/2018   Procedure: LEFT HEART CATH AND CORONARY ANGIOGRAPHY;  Surgeon: [name], MD;  Location: [place] [facility];  Service: Cardiovascular;  Laterality: N/A;  . MELANOMA EXCISION  2012   per [name]  . PROSTATECTOMY     per [name]  . TONSILLECTOMY    . UMBILICAL HERNIA REPAIR  11/01/2011   Procedure: HERNIA REPAIR UMBILICAL ADULT;  Surgeon: Earnstine Regal, MD;  Location: [place];  Service: General;  Laterality: N/A;  Repair umbilical hernia with mesh patch  . VASECTOMY    . VENTRAL HERNIA REPAIR  01/10/2013   with mesh     [name]  . VENTRAL HERNIA REPAIR N/A 01/10/2013   Procedure: HERNIA REPAIR VENTRAL ADULT ;  Surgeon: Earnstine Regal, MD;  Location: [place];   Service: General;  Laterality: N/A;     Current Outpatient Medications  Medication Sig Dispense Refill  . aspirin EC 81 MG tablet Take 81 mg by mouth every evening.     . bimatoprost (LUMIGAN) 0.01 % SOLN Place 1 drop into both eyes at bedtime.    . clopidogrel (PLAVIX) 75 MG tablet Take 1 tablet (75 mg total) by mouth daily with breakfast. 90 tablet 8  . Coenzyme Q10 (COQ-10) 100 MG CAPS Take 100 mg by mouth every evening.     . dorzolamide-timolol (COSOPT) 22.3-6.8 MG/ML ophthalmic solution Place 1 drop into the right eye 2 (two) times daily.    . fenofibrate 160 MG tablet Take 1 tablet (160 mg total) by mouth daily. (Patient taking differently: Take 160 mg by mouth every evening. ) 90 tablet 3  . isosorbide mononitrate (IMDUR) 30 MG 24 hr tablet Take 0.5 tablets (15 mg total) by mouth daily. 45 tablet 3  . lisinopril (PRINIVIL,ZESTRIL) 20 MG tablet Take 1 tablet (20 mg total) by mouth daily. 90 tablet 3  . magnesium oxide (MAG-OX) 400 MG tablet Take 400 mg by mouth every evening.     . Multiple Vitamins-Minerals (MULTIVITAMIN WITH MINERALS) tablet Take 1 tablet by mouth 2 (two) times a week. Sundays and wednesdays    . nitroGLYCERIN (NITROSTAT) 0.4 MG SL tablet Place 1 tablet (0.4 mg total) under the tongue every 5 (five) minutes as needed. (Patient taking differently: Place 0.4 mg under the tongue every 5 (five) minutes as needed for chest pain. ) 25 tablet 3  . rosuvastatin (CRESTOR) 20 MG tablet Take 1 tablet (20 mg total) by mouth daily. 90 tablet 3  . tadalafil (CIALIS) 20 MG tablet Take 1 tablet (20 mg total) by mouth daily as needed. (Patient taking differently: Take 20 mg by mouth daily as needed for erectile dysfunction. ) 18 tablet 3   No current facility-administered medications for this visit.     Allergies:   Patient has no known allergies.    Social History:  The patient  reports that he quit smoking about 45 years ago. He started smoking about 55 years ago. He has a 9.00  pack-year smoking history. He has never used smokeless tobacco. He reports that he does not drink alcohol or use drugs.   Family History:  The patient's family history includes Heart failure in his father; Stroke in his mother.    ROS: All other systems are reviewed and negative. Unless otherwise mentioned in H&P    PHYSICAL EXAM: VS:  There were no vitals taken for this visit. , BMI There is no height or weight on file  to calculate BMI. GEN: Well nourished, well developed, in no acute distress HEENT: normal Neck: no JVD, carotid bruits, or masses Cardiac: RRR; no murmurs, rubs, or gallops,no edema  Respiratory:  Mild crackles in the bases. Cleared with cough  GI: soft, nontender, nondistended, + BS MS: no deformity or atrophy Skin: warm and dry, no rash Neuro:  Strength and sensation are intact Psych: euthymic mood, full affect   EKG:  Sinus bradycardia rate of 47 bpm with 1st degree AV block, PR interval 216 ms. Non-specific T-wave abnormality in the inferior leads.    Recent Labs: 11/07/2017: ALT 17; TSH 3.11 01/30/2018: Hemoglobin 14.8; Platelets 227 02/21/2018: BUN 14; Creatinine, Ser 1.43; Potassium 4.6; Sodium 143    Lipid Panel    Component Value Date/Time   CHOL 163 11/07/2017 1133   TRIG 165.0 (H) 11/07/2017 1133   HDL 43.10 11/07/2017 1133   CHOLHDL 4 11/07/2017 1133   VLDL 33.0 11/07/2017 1133   LDLCALC 87 11/07/2017 1133   LDLDIRECT 82.1 04/07/2011 0807      Wt Readings from Last 3 Encounters:  01/30/18 193 lb 12.8 oz (87.9 kg)  01/17/18 190 lb (86.2 kg)  01/16/18 194 lb (88 kg)      Other studies Reviewed: Cardiac Cath 01/17/2018    CULPRIT LESION: Prox Cx to Mid Cx lesion is 70% stenosed.  A drug-eluting stent was successfully placed using a STENT SIERRA 2.75 X 15 MM -postdilated to 3.0 mm with stent balloon  Post intervention, there is a 0% residual stenosis.  Mid Cx lesion is 35% stenosed.  Normal LVEDP with no aortic stenosis.     SUMMARY  Significant single-vessel disease as predicted by CT, mid circumflex 70% stenosis followed by a more distal 40% stenosis.  FFR 0.78.  --Successful PCI with Xience DES 2.75 mm x 15 mm (3.0 mm)  Otherwise minimal CAD.  Normal LVEDP.  Normal EF by echo.   RECOMMENDATIONS  Uncomplicated single-vessel PCI with no significant chest pain.  Should be fine for same-day discharge.  Increased statin dose to 20 mg daily  Not on beta-blocker because of bradycardia, pending blood pressure in the outpatient setting, consider calcium channel blocker for antianginal effect.  Recommend uninterrupted dual antiplatelet therapy with Aspirin 81mg  daily and Clopidogrel 75mg  daily for a minimum of 6 months (stable ischemic heart disease - Class I recommendation).         ASSESSMENT AND PLAN:  1. CAD: Recent PCI with stent to the prox to mid Cx in November of 2019. Some distal stenosis of 40 % was noted and 35% mid Cx. He was been placed on isosorbide 15 mg with improvement of symptoms but not complete relief. He still has some DOE. I will have him complete a NM stress test for ischemia of the Cx territory for need to repeat cardiac cath. If normal would pursue PFT's to evaluate lung function.   2. Myalgia Pain: Worsening of symptoms with Crestor 20 mg daily. Will decrease to 10 mg daily. If symptoms do not improve will go back up on the Crestor to 20 mg. He may need to see orthopedist for further evaluation. He sees one for DDD.  Continue C0-Q 10.  3. Hypertension: Well controlled. Will follow this with increase dose of isosorbide which will lower BP.May need to consider decreasing lisinopril dose.   4. Hypercholesterolemia: On Crestor as above. Reducing dose due to myalgia pain. If now improvement go back to 20 mg daily. If he is improved may need to  add Zetia or consider PCSK9 therapy. Goal of LDL < 70. Recheck on next appointment in January 2020 which is already scheduled with Dr.  Johnsie Cancel.   Current medicines are reviewed at length with the patient today.    Labs/ tests ordered today include: NM Stress test.   [name]. West Pugh, ANP, [facility]   02/22/2018 7:58 AM    [place] [address] Office [phone] Fax [phone]

## 2018-02-22 ENCOUNTER — Encounter: Payer: Self-pay | Admitting: Adult Health

## 2018-02-22 ENCOUNTER — Ambulatory Visit (INDEPENDENT_AMBULATORY_CARE_PROVIDER_SITE_OTHER): Payer: Medicare Other | Admitting: Adult Health

## 2018-02-22 VITALS — BP 130/76 | HR 47 | Ht 66.0 in | Wt 192.4 lb

## 2018-02-22 DIAGNOSIS — I209 Angina pectoris, unspecified: Secondary | ICD-10-CM | POA: Diagnosis not present

## 2018-02-22 DIAGNOSIS — Z955 Presence of coronary angioplasty implant and graft: Secondary | ICD-10-CM

## 2018-02-22 DIAGNOSIS — I2510 Atherosclerotic heart disease of native coronary artery without angina pectoris: Secondary | ICD-10-CM

## 2018-02-22 DIAGNOSIS — E7800 Pure hypercholesterolemia, unspecified: Secondary | ICD-10-CM | POA: Diagnosis not present

## 2018-02-22 DIAGNOSIS — R0609 Other forms of dyspnea: Secondary | ICD-10-CM | POA: Diagnosis not present

## 2018-02-22 DIAGNOSIS — R0789 Other chest pain: Secondary | ICD-10-CM

## 2018-02-22 MED ORDER — ISOSORBIDE MONONITRATE ER 30 MG PO TB24: 30.0000 mg | ORAL_TABLET | Freq: Every day | ORAL | 3 refills | Status: DC

## 2018-02-22 MED ORDER — ROSUVASTATIN CALCIUM 20 MG PO TABS: 10.0000 mg | ORAL_TABLET | Freq: Every day | ORAL | 12 refills | Status: DC

## 2018-02-22 NOTE — Patient Instructions (Addendum)
Medication Instructions:  INCREASE ISOSORBIDE 30MG  DAILY DECREASE CRESTOR 10MG  DAILY If you need a refill on your cardiac medications before your next appointment, please call your pharmacy.  Labwork: NONE  If you have labs (blood work) drawn today and your tests are completely normal, you will receive your results only by MyChart Message (if you have MyChart) -OR- A paper copy in the mail.  If you have any lab test that is abnormal or we need to change your treatment, we will call you to review these results.  Testing/Procedures: Your physician has requested that you have an Exercise Myoview. A cardiac stress test is a cardiological test that measures the heart's ability to respond to external stress in a controlled clinical environment. The stress response is induced by exercise (exercise-treadmill). For further information please visit [email or web address]. If you have questions or concerns about your appointment, you can call the Nuclear Lab at [phone].   Special Instructions: PLEASE CALL IF YOUR SYMPTOMS PERSIST/WORSEN  Follow-Up: You will need a follow up appointment in  [place].  You may see Jenkins Rouge, MD or one of the following Advanced Practice Providers on your designated Care Team:    Truitt Merle, NP Cecilie Kicks, NP Kathyrn Drown, NP  At [facility], you and your health needs are our priority.  As part of our continuing mission to provide you with exceptional heart care, we have created designated Provider Care Teams.  These Care Teams include your primary Cardiologist (physician) and Advanced Practice Providers (APPs -  Physician Assistants and Nurse Practitioners) who all work together to provide you with the care you need, when you need it.

## 2018-02-27 ENCOUNTER — Telehealth (HOSPITAL_COMMUNITY): Payer: Self-pay

## 2018-03-01 ENCOUNTER — Ambulatory Visit (HOSPITAL_COMMUNITY)
Admission: RE | Admit: 2018-03-01 | Discharge: 2018-03-01 | Disposition: A | Discharge status: Home / Self Care | Payer: Medicare Other | Source: Ambulatory Visit | Attending: Cardiovascular Disease | Admitting: Cardiovascular Disease

## 2018-03-01 ENCOUNTER — Encounter: Payer: Self-pay | Admitting: Cardiovascular Disease

## 2018-03-01 DIAGNOSIS — R0789 Other chest pain: Secondary | ICD-10-CM | POA: Diagnosis not present

## 2018-03-01 LAB — MYOCARDIAL PERFUSION IMAGING
CHL CUP NUCLEAR SSS: 2
CHL RATE OF PERCEIVED EXERTION: 19
CSEPEW: 10.1 METS
CSEPPHR: 139 {beats}/min
Exercise duration (min): 9 min
Exercise duration (sec): 0 s
LV dias vol: 108 mL (ref 62–150)
LVSYSVOL: 43 mL
MPHR: 150 {beats}/min
Percent HR: 92 %
Rest HR: 54 {beats}/min
SDS: 1
SRS: 1
TID: 1.1

## 2018-03-01 MED ORDER — TECHNETIUM TC 99M TETROFOSMIN IV KIT
29.3000 | PACK | Freq: Once | INTRAVENOUS | Status: AC | PRN
  Administered 2018-03-01: 29.3 via INTRAVENOUS
  Filled 2018-03-01: qty 30

## 2018-03-01 MED ORDER — TECHNETIUM TC 99M TETROFOSMIN IV KIT
10.2000 | PACK | Freq: Once | INTRAVENOUS | Status: AC | PRN
  Administered 2018-03-01: 10.2 via INTRAVENOUS
  Filled 2018-03-01: qty 11

## 2018-03-12 ENCOUNTER — Other Ambulatory Visit: Payer: Self-pay

## 2018-03-12 DIAGNOSIS — R0609 Other forms of dyspnea: Principal | ICD-10-CM

## 2018-03-12 NOTE — Progress Notes (Signed)
    Subject: Visit Follow-Up Question               Received test results yesterday on my Myocardial Perfusion Imaging . From what I understood it looked normal. I am still having shortness of breath with pressure when I swim or walk on treadmill. Just wondering what next step is to find out what is causing this? Thanks, [name] [name]   We will order pulmonary function test (PFT's) to check breathing status. I will have my nurse call you to schedule this.   Curt Bears

## 2018-03-13 ENCOUNTER — Telehealth: Payer: Self-pay | Admitting: *Deleted

## 2018-03-13 NOTE — Telephone Encounter (Signed)
Left message for patient to call and schedule PFT's ordered by Jory Sims, DNP

## 2018-03-14 ENCOUNTER — Telehealth (HOSPITAL_COMMUNITY): Payer: Self-pay

## 2018-03-14 ENCOUNTER — Ambulatory Visit: Payer: Medicare Other

## 2018-03-14 DIAGNOSIS — R0609 Other forms of dyspnea: Principal | ICD-10-CM

## 2018-03-14 LAB — PULMONARY FUNCTION TEST
DL/VA % PRED: 102 %
DL/VA: 4.43 ml/min/mmHg/L
DLCO unc % pred: 115 %
DLCO unc: 31.2 ml/min/mmHg
FEF 25-75 PRE: 2.42 L/s
FEF 25-75 Post: 3.07 L/sec
FEF2575-%CHANGE-POST: 27 %
FEF2575-%Pred-Post: 145 %
FEF2575-%Pred-Pre: 114 %
FEV1-%CHANGE-POST: 6 %
FEV1-%Pred-Post: 119 %
FEV1-%Pred-Pre: 112 %
FEV1-PRE: 3.11 L
FEV1-Post: 3.31 L
FEV1FVC-%Change-Post: 0 %
FEV1FVC-%PRED-PRE: 102 %
FEV6-%CHANGE-POST: 6 %
FEV6-%PRED-POST: 123 %
FEV6-%Pred-Pre: 116 %
FEV6-POST: 4.37 L
FEV6-Pre: 4.11 L
FEV6FVC-%Change-Post: 0 %
FEV6FVC-%PRED-POST: 106 %
FEV6FVC-%Pred-Pre: 106 %
FVC-%CHANGE-POST: 6 %
FVC-%Pred-Post: 116 %
FVC-%Pred-Pre: 109 %
FVC-Post: 4.38 L
FVC-Pre: 4.13 L
PRE FEV6/FVC RATIO: 100 %
Post FEV1/FVC ratio: 76 %
Post FEV6/FVC ratio: 100 %
Pre FEV1/FVC ratio: 75 %

## 2018-03-14 NOTE — Telephone Encounter (Signed)
Cardiac Rehab Medication Review by a Pharmacist  Does the patient  feel that his/her medications are working for him/her?  yes  Has the patient been experiencing any side effects to the medications prescribed?  no  Does the patient measure his/her own blood pressure or blood glucose at home?  yes: ~140/80 (patient states he followed with heart care for a few weeks and recorded BP 3-4 times daily)  Does the patient have any problems obtaining medications due to transportation or finances?   no  Understanding of regimen: excellent Understanding of indications: excellent Potential of compliance: excellent  Pharmacist comments:  Patient fills prescriptions with the [place]. He uses [facility] for 1st time prescriptions if the [place] is not accessible.   Vertis Kelch, PharmD PGY1 Pharmacy Resident Phone [phone] 03/14/2018       11:45 AM

## 2018-03-19 NOTE — Progress Notes (Signed)
[name] [name]. 73 y.o. male DOB 04/24/1945 MRN [id number]       Nutrition  No diagnosis found. Past Medical History:  Diagnosis Date  . ABNORMAL EKG 10/07/2008  . ADVEF, DRUG/MED/BIOL SUBST, OTHER DRUG NOS 09/26/2006  . Allergy    seasonal  . Cataract    left eye-surgery 07-2015  . DEGENERATIVE JOINT DISEASE, MODERATE 04/13/2007  . Diabetes mellitus    type 2, diet controlled   . DIVERTICULOSIS, COLON 04/13/2007  . ELEVATED PROSTATE SPECIFIC ANTIGEN 04/14/2007  . Glaucoma    sees [name]   . GOUT 05/16/2007  . HYPERLIPIDEMIA 09/26/2006  . HYPERTENSION 04/13/2007  . JOINT EFFUSION, KNEE 06/15/2009  . Melanoma [facility])    sees [name]   . PROSTATE CANCER 10/05/2009   sees [name]  . Renal insufficiency    RESOLVED PER PATIENT   Meds reviewed.     Current Outpatient Medications (Cardiovascular):  .  fenofibrate 160 MG tablet, Take 1 tablet (160 mg total) by mouth daily. (Patient taking differently: Take 160 mg by mouth every evening. ) .  isosorbide mononitrate (IMDUR) 30 MG 24 hr tablet, Take 1 tablet (30 mg total) by mouth daily. Marland Kitchen  lisinopril (PRINIVIL,ZESTRIL) 20 MG tablet, Take 1 tablet (20 mg total) by mouth daily. .  nitroGLYCERIN (NITROSTAT) 0.4 MG SL tablet, Place 1 tablet (0.4 mg total) under the tongue every 5 (five) minutes as needed. (Patient taking differently: Place 0.4 mg under the tongue every 5 (five) minutes as needed for chest pain. ) .  rosuvastatin (CRESTOR) 20 MG tablet, Take 0.5 tablets (10 mg total) by mouth daily. (Patient taking differently: Take 20 mg by mouth daily. )   Current Outpatient Medications (Analgesics):  .  aspirin EC 81 MG tablet, Take 81 mg by mouth every evening.   Current Outpatient Medications (Hematological):  .  clopidogrel (PLAVIX) 75 MG tablet, Take 1 tablet (75 mg total) by mouth daily with breakfast.  Current Outpatient Medications (Other):  .  bimatoprost (LUMIGAN) 0.01 % SOLN, Place 1 drop into both eyes at bedtime. .   Coenzyme Q10 (COQ-10) 100 MG CAPS, Take 100 mg by mouth every evening.  .  dorzolamide-timolol (COSOPT) 22.3-6.8 MG/ML ophthalmic solution, Place 1 drop into the right eye 2 (two) times daily. .  magnesium oxide (MAG-OX) 400 MG tablet, Take 400 mg by mouth every evening.  .  Multiple Vitamins-Minerals (MULTIVITAMIN WITH MINERALS) tablet, Take 1 tablet by mouth 2 (two) times a week. Sundays and wednesdays   HT: Ht Readings from Last 1 Encounters:  03/01/18 5\' 6"  (1.676 m)    WT: Wt Readings from Last 5 Encounters:  03/01/18 192 lb (87.1 kg)  02/22/18 192 lb 6.4 oz (87.3 kg)  01/30/18 193 lb 12.8 oz (87.9 kg)  01/17/18 190 lb (86.2 kg)  01/16/18 194 lb (88 kg)     There is no height or weight on file to calculate BMI =30.9   03/01/18   Current tobacco use? No       Labs:  Lipid Panel     Component Value Date/Time   CHOL 163 11/07/2017 1133   TRIG 165.0 (H) 11/07/2017 1133   HDL 43.10 11/07/2017 1133   CHOLHDL 4 11/07/2017 1133   VLDL 33.0 11/07/2017 1133   LDLCALC 87 11/07/2017 1133   LDLDIRECT 82.1 04/07/2011 0807    Lab Results  Component Value Date   HGBA1C 6.0 11/07/2017   CBG (last 3)  No results for input(s): GLUCAP  in the last 72 hours.  Nutrition Diagnosis ? Food-and nutrition-related knowledge deficit related to lack of exposure to information as related to diagnosis of: ? CVD ? Type 2 Diabetes  Nutrition Goal(s):  ? To be determined  Plan:  Pt to attend nutrition classes ? Nutrition I ? Nutrition II ? Portion Distortion  ? Diabetes Blitz ? Diabetes Q & A Will provide client-centered nutrition education as part of interdisciplinary care.   Monitor and evaluate progress toward nutrition goal with team.  [name], MS, RD, LDN 03/19/2018 10:52 AM

## 2018-03-22 ENCOUNTER — Encounter (HOSPITAL_COMMUNITY): Payer: Self-pay

## 2018-03-22 ENCOUNTER — Encounter (HOSPITAL_COMMUNITY)
Admission: RE | Admit: 2018-03-22 | Discharge: 2018-03-22 | Disposition: A | Discharge status: Home / Self Care | Payer: Medicare Other | Source: Ambulatory Visit | Attending: Cardiovascular Disease | Admitting: Cardiovascular Disease

## 2018-03-22 VITALS — BP 130/72 | HR 66 | Ht 66.5 in | Wt 192.0 lb

## 2018-03-22 DIAGNOSIS — Z955 Presence of coronary angioplasty implant and graft: Secondary | ICD-10-CM | POA: Insufficient documentation

## 2018-03-22 NOTE — Progress Notes (Signed)
[name] [name]. 73 y.o. male DOB: 07-29-1945 MRN: [id number]      Nutrition Note  1. Status post coronary artery stent placement    Past Medical History:  Diagnosis Date  . ABNORMAL EKG 10/07/2008  . ADVEF, DRUG/MED/BIOL SUBST, OTHER DRUG NOS 09/26/2006  . Allergy    seasonal  . Cataract    left eye-surgery 07-2015  . DEGENERATIVE JOINT DISEASE, MODERATE 04/13/2007  . Diabetes mellitus    type 2, diet controlled   . DIVERTICULOSIS, COLON 04/13/2007  . ELEVATED PROSTATE SPECIFIC ANTIGEN 04/14/2007  . Glaucoma    sees [name]   . GOUT 05/16/2007  . HYPERLIPIDEMIA 09/26/2006  . HYPERTENSION 04/13/2007  . JOINT EFFUSION, KNEE 06/15/2009  . Melanoma [facility])    sees [name]   . PROSTATE CANCER 10/05/2009   sees [name]  . Renal insufficiency    RESOLVED PER PATIENT   Meds reviewed.    Current Outpatient Medications (Cardiovascular):  .  fenofibrate 160 MG tablet, Take 1 tablet (160 mg total) by mouth daily. (Patient taking differently: Take 160 mg by mouth every evening. ) .  isosorbide mononitrate (IMDUR) 30 MG 24 hr tablet, Take 1 tablet (30 mg total) by mouth daily. Marland Kitchen  lisinopril (PRINIVIL,ZESTRIL) 20 MG tablet, Take 1 tablet (20 mg total) by mouth daily. .  nitroGLYCERIN (NITROSTAT) 0.4 MG SL tablet, Place 1 tablet (0.4 mg total) under the tongue every 5 (five) minutes as needed. (Patient taking differently: Place 0.4 mg under the tongue every 5 (five) minutes as needed for chest pain. ) .  rosuvastatin (CRESTOR) 20 MG tablet, Take 0.5 tablets (10 mg total) by mouth daily. (Patient taking differently: Take 20 mg by mouth daily. )   Current Outpatient Medications (Analgesics):  .  aspirin EC 81 MG tablet, Take 81 mg by mouth every evening.   Current Outpatient Medications (Hematological):  .  clopidogrel (PLAVIX) 75 MG tablet, Take 1 tablet (75 mg total) by mouth daily with breakfast.  Current Outpatient Medications (Other):  .  bimatoprost (LUMIGAN) 0.01 % SOLN, Place 1 drop  into both eyes at bedtime. .  Coenzyme Q10 (COQ-10) 100 MG CAPS, Take 100 mg by mouth every evening.  .  dorzolamide-timolol (COSOPT) 22.3-6.8 MG/ML ophthalmic solution, Place 1 drop into the right eye 2 (two) times daily. .  magnesium oxide (MAG-OX) 400 MG tablet, Take 400 mg by mouth every evening.  .  Multiple Vitamins-Minerals (MULTIVITAMIN WITH MINERALS) tablet, Take 1 tablet by mouth 2 (two) times a week. Sundays and wednesdays   HT: Ht Readings from Last 1 Encounters:  03/22/18 5' 6.5" (1.689 m)    WT: Wt Readings from Last 5 Encounters:  03/22/18 192 lb 0.3 oz (87.1 kg)  03/01/18 192 lb (87.1 kg)  02/22/18 192 lb 6.4 oz (87.3 kg)  01/30/18 193 lb 12.8 oz (87.9 kg)  01/17/18 190 lb (86.2 kg)     Body mass index is 30.53 kg/m.   Current tobacco use? No  Labs:  Lipid Panel     Component Value Date/Time   CHOL 163 11/07/2017 1133   TRIG 165.0 (H) 11/07/2017 1133   HDL 43.10 11/07/2017 1133   CHOLHDL 4 11/07/2017 1133   VLDL 33.0 11/07/2017 1133   LDLCALC 87 11/07/2017 1133   LDLDIRECT 82.1 04/07/2011 0807    Lab Results  Component Value Date   HGBA1C 6.0 11/07/2017   CBG (last 3)  No results for input(s): GLUCAP in the last 72 hours.  Nutrition Note Spoke with pt. Nutrition plan and goals reviewed with pt. Pt is following Step 2 of the Therapeutic Lifestyle Changes diet. Pt wants to lose wt, ~10-15 lbs. Pt has been trying to lose wt by changing the kind of carbohydrate he eats, avoiding fried foods, avoiding red meat, avoiding added sugars in foods (sugar sweetened beverages, sugar in coffee, baked goods etc.). Heart healthy diabetic wt loss tips reviewed (label reading, how to build a healthy plate, portion sizes, eating frequently across the day). Pt shared he was also worried about his kidney's, discussed that continuing to manage his diabetes with diet and exercise and focusing on keeping his blood pressure within normal range would help to care for his kidney's.  Discussed watching sodium intake as well to help with his blood pressure, and aiming for <1500 mg per day of sodium. Pt has Type 2 Diabetes. Last A1c indicates blood glucose well-controlled. This Probation officer went over Diabetes Education test results. Pt checks CBG's 1 times a day. Fasting CBG's reportedly 100-110 mg/dL. Per discussion, pt does not use canned/convenience foods often. Pt does not add salt to food. Pt does not eat out frequently. Pt expressed understanding of the information reviewed. Pt aware of nutrition education classes offered and would like to attend nutrition classes.  Nutrition Diagnosis ? Food-and nutrition-related knowledge deficit related to lack of exposure to information as related to diagnosis of: ? CVD ? Type 2 Diabetes ? Obese  I = 30-34.9 related to excessive energy intake as evidenced by a Body mass index is 30.53 kg/m. ? Excessive sodium intake related to over consumption of processed food as evidenced by frequent consumption of convenience food/ canned vegetables and eating out frequently. ? Limited adherence to nutrition-related recommendations related to poor understanding or disinterest as evidenced by food history. ? Not ready for lifestyle change related to denial of need for change as evidenced by reluctance to participate in encounter. ? Inappropriate intake of food fats related to food and nutrition-related knowledge deficit as evidenced by frequent consumption of  ? Increased energy expenditure related to increased energy requirements during COPD exacerbation as evidenced by BMI <20 and recent h/o wt loss. ? Unintentional wt loss related to fatigue during food preparation and decreased food intake as evidenced by wt loss of 5% in one month. ? Inadequate vitamin intake related to tobacco use as evidenced by pt reporting he/she smokes/uses  Packs/day.  Nutrition Intervention ? Pt's individual nutrition plan and goals reviewed with pt. ? Pt given handouts for: ?  Nutrition I class ? Nutrition II class  ? Diabetes Blitz Class ? Diabetes Q & A class  ? Consistent vit K diet ? low sodium ? DM ? pre-diabetes  Nutrition Goal(s):  ? Pt to identify and limit food sources of saturated fat, trans fat, refined carbohydrates and sodium ? Pt to identify food quantities necessary to achieve weight loss of 6-24 lbs. at graduation from cardiac rehab. ? Pt able to name foods that affect blood glucose.  Plan:  ? Pt to attend nutrition classes ? Nutrition I ? Nutrition II ? Portion Distortion  ? Diabetes Blitz ? Diabetes Q & Ae determined ? Will provide client-centered nutrition education as part of interdisciplinary care ? Monitor and evaluate progress toward nutrition goal with team.   [name], MS, RD, LDN 03/22/2018 11:50 AM

## 2018-03-22 NOTE — Progress Notes (Addendum)
Cardiac Individual Treatment Plan  Patient Details  Name: [name] [name]. MRN: [id number] Date of Birth: 1945/10/15 Referring Provider:     CARDIAC REHAB PHASE II ORIENTATION from 03/22/2018 in [place]  Referring Provider  [name]      Initial Encounter Date:    CARDIAC REHAB PHASE II ORIENTATION from 03/22/2018 in [place]  Date  03/22/18      Visit Diagnosis: Status post coronary artery stent placement  Patient's Home Medications on Admission:  Current Outpatient Medications:  .  aspirin EC 81 MG tablet, Take 81 mg by mouth every evening. , Disp: , Rfl:  .  bimatoprost (LUMIGAN) 0.01 % SOLN, Place 1 drop into both eyes at bedtime., Disp: , Rfl:  .  clopidogrel (PLAVIX) 75 MG tablet, Take 1 tablet (75 mg total) by mouth daily with breakfast., Disp: 90 tablet, Rfl: 8 .  Coenzyme Q10 (COQ-10) 100 MG CAPS, Take 100 mg by mouth every evening. , Disp: , Rfl:  .  dorzolamide-timolol (COSOPT) 22.3-6.8 MG/ML ophthalmic solution, Place 1 drop into the right eye 2 (two) times daily., Disp: , Rfl:  .  fenofibrate 160 MG tablet, Take 1 tablet (160 mg total) by mouth daily. (Patient taking differently: Take 160 mg by mouth every evening. ), Disp: 90 tablet, Rfl: 3 .  isosorbide mononitrate (IMDUR) 30 MG 24 hr tablet, Take 1 tablet (30 mg total) by mouth daily., Disp: 90 tablet, Rfl: 3 .  lisinopril (PRINIVIL,ZESTRIL) 20 MG tablet, Take 1 tablet (20 mg total) by mouth daily., Disp: 90 tablet, Rfl: 3 .  magnesium oxide (MAG-OX) 400 MG tablet, Take 400 mg by mouth every evening. , Disp: , Rfl:  .  Multiple Vitamins-Minerals (MULTIVITAMIN WITH MINERALS) tablet, Take 1 tablet by mouth 2 (two) times a week. Sundays and wednesdays, Disp: , Rfl:  .  nitroGLYCERIN (NITROSTAT) 0.4 MG SL tablet, Place 1 tablet (0.4 mg total) under the tongue every 5 (five) minutes as needed. (Patient taking differently: Place 0.4 mg under the tongue  every 5 (five) minutes as needed for chest pain. ), Disp: 25 tablet, Rfl: 3 .  rosuvastatin (CRESTOR) 20 MG tablet, Take 0.5 tablets (10 mg total) by mouth daily. (Patient taking differently: Take 20 mg by mouth daily. ), Disp: 30 tablet, Rfl: 12  Past Medical History: Past Medical History:  Diagnosis Date  . ABNORMAL EKG 10/07/2008  . ADVEF, DRUG/MED/BIOL SUBST, OTHER DRUG NOS 09/26/2006  . Allergy    seasonal  . Cataract    left eye-surgery 07-2015  . DEGENERATIVE JOINT DISEASE, MODERATE 04/13/2007  . Diabetes mellitus    type 2, diet controlled   . DIVERTICULOSIS, COLON 04/13/2007  . ELEVATED PROSTATE SPECIFIC ANTIGEN 04/14/2007  . Glaucoma    sees [name]   . GOUT 05/16/2007  . HYPERLIPIDEMIA 09/26/2006  . HYPERTENSION 04/13/2007  . JOINT EFFUSION, KNEE 06/15/2009  . Melanoma [facility])    sees [name]   . PROSTATE CANCER 10/05/2009   sees [name]  . Renal insufficiency    RESOLVED PER PATIENT    Tobacco Use: Social History   Tobacco Use  Smoking Status Former Smoker  . Packs/day: 1.00  . Years: 9.00  . Pack years: 9.00  . Start date: 03/08/1963  . Last attempt to quit: 03/07/1972  . Years since quitting: 46.0  Smokeless Tobacco Never Used  Tobacco Comment   States AAA completed     Labs: Recent Review  Flowsheet Data    Labs for ITP Cardiac and Pulmonary Rehab Latest Ref Rng & Units 11/06/2015 05/05/2016 11/08/2016 05/09/2017 11/07/2017   Cholestrol 0 - 200 mg/dL 187 127 135 132 163   LDLCALC 0 - 99 mg/dL 115(H) 64 67 71 87   LDLDIRECT mg/dL - - - - -   HDL >39.00 mg/dL 47.80 41.70 50.30 45.00 43.10   Trlycerides 0.0 - 149.0 mg/dL 124.0 107.0 88.0 80.0 165.0(H)   Hemoglobin A1c 4.6 - 6.5 % 6.2 6.0 5.7 5.9 6.0      Capillary Blood Glucose: Lab Results  Component Value Date   GLUCAP 94 11/01/2011     Exercise Target Goals: Exercise Program Goal: Individual exercise prescription set using results from initial 6 min walk test and THRR while considering  patient's activity  barriers and safety.   Exercise Prescription Goal: Initial exercise prescription builds to 30-45 minutes a day of aerobic activity, 2-3 days per week.  Home exercise guidelines will be given to patient during program as part of exercise prescription that the participant will acknowledge.  Activity Barriers & Risk Stratification: Activity Barriers & Cardiac Risk Stratification - 03/22/18 1113      Activity Barriers & Cardiac Risk Stratification   Activity Barriers  Arthritis;Other (comment);Joint Problems    Comments  Torn rotator cuff (right)     Cardiac Risk Stratification  Moderate       6 Minute Walk: 6 Minute Walk    Row Name 03/22/18 1112         6 Minute Walk   Phase  Initial     Distance  1691 feet     Walk Time  6 minutes     # of Rest Breaks  0     MPH  3.2     METS  3.4     RPE  11     VO2 Peak  11.85     Symptoms  No     Resting HR  66 bpm     Resting BP  130/72     Resting Oxygen Saturation   97 %     Exercise Oxygen Saturation  during 6 min walk  97 %     Max Ex. HR  91 bpm     Max Ex. BP  142/82     2 Minute Post BP  122/78        Oxygen Initial Assessment:   Oxygen Re-Evaluation:   Oxygen Discharge (Final Oxygen Re-Evaluation):   Initial Exercise Prescription: Initial Exercise Prescription - 03/22/18 1100      Date of Initial Exercise RX and Referring Provider   Date  03/22/18    Referring Provider  [name]    Expected Discharge Date  06/29/18      Treadmill   MPH  3    Grade  0    Minutes  10      Recumbant Bike   Level  2    Watts  35    Minutes  10    METs  3.3      NuStep   Level  3    SPM  85    Minutes  10    METs  3      Prescription Details   Frequency (times per week)  3    Duration  Progress to 30 minutes of continuous aerobic without signs/symptoms of physical distress      Intensity   THRR 40-80% of Max Heartrate  60-120    Ratings of Perceived Exertion  11-13      Progression   Progression  Continue to  progress workloads to maintain intensity without signs/symptoms of physical distress.      Resistance Training   Training Prescription  Yes    Weight  4 lbs.     Reps  10-15       Perform Capillary Blood Glucose checks as needed.  Exercise Prescription Changes:   Exercise Comments:   Exercise Goals and Review: Exercise Goals    Row Name 03/22/18 1114             Exercise Goals   Increase Physical Activity  Yes       Intervention  Provide advice, education, support and counseling about physical activity/exercise needs.;Develop an individualized exercise prescription for aerobic and resistive training based on initial evaluation findings, risk stratification, comorbidities and participant's personal goals.       Expected Outcomes  Short Term: Attend rehab on a regular basis to increase amount of physical activity.;Long Term: Add in home exercise to make exercise part of routine and to increase amount of physical activity.;Long Term: Exercising regularly at least 3-5 days a week.       Increase Strength and Stamina  Yes       Intervention  Provide advice, education, support and counseling about physical activity/exercise needs.;Develop an individualized exercise prescription for aerobic and resistive training based on initial evaluation findings, risk stratification, comorbidities and participant's personal goals.       Expected Outcomes  Short Term: Increase workloads from initial exercise prescription for resistance, speed, and METs.       Able to understand and use rate of perceived exertion (RPE) scale  Yes       Intervention  Provide education and explanation on how to use RPE scale       Expected Outcomes  Short Term: Able to use RPE daily in rehab to express subjective intensity level;Long Term:  Able to use RPE to guide intensity level when exercising independently       Knowledge and understanding of Target Heart Rate Range (THRR)  Yes       Intervention  Provide education  and explanation of THRR including how the numbers were predicted and where they are located for reference       Expected Outcomes  Short Term: Able to state/look up THRR;Long Term: Able to use THRR to govern intensity when exercising independently;Short Term: Able to use daily as guideline for intensity in rehab       Able to check pulse independently  Yes       Intervention  Provide education and demonstration on how to check pulse in carotid and radial arteries.;Review the importance of being able to check your own pulse for safety during independent exercise       Expected Outcomes  Short Term: Able to explain why pulse checking is important during independent exercise;Long Term: Able to check pulse independently and accurately       Understanding of Exercise Prescription  Yes       Intervention  Provide education, explanation, and written materials on patient's individual exercise prescription       Expected Outcomes  Short Term: Able to explain program exercise prescription;Long Term: Able to explain home exercise prescription to exercise independently          Exercise Goals Re-Evaluation :   Discharge Exercise Prescription (Final Exercise Prescription Changes):   Nutrition:  Target  Goals: Understanding of nutrition guidelines, daily intake of sodium 1500mg , cholesterol 200mg , calories 30% from fat and 7% or less from saturated fats, daily to have 5 or more servings of fruits and vegetables.  Biometrics: Pre Biometrics - 03/22/18 1115      Pre Biometrics   Height  5' 6.5" (1.689 m)    Weight  87.1 kg    Waist Circumference  41 inches    Hip Circumference  41 inches    Waist to Hip Ratio  1 %    BMI (Calculated)  30.53    Triceps Skinfold  18 mm    % Body Fat  30.1 %    Grip Strength  45 kg    Flexibility  14 in    Single Leg Stand  30 seconds        Nutrition Therapy Plan and Nutrition Goals: Nutrition Therapy & Goals - 03/22/18 1154      Nutrition Therapy   Diet   carb modified, heart healthy      Personal Nutrition Goals   Nutrition Goal  Pt to identify and limit food sources of saturated fat, trans fat, refined carbohydrates and sodium    Personal Goal #2  Pt to identify food quantities necessary to achieve weight loss of 6-24 lbs. at graduation from cardiac rehab.       Nutrition Assessments:   Nutrition Goals Re-Evaluation:   Nutrition Goals Re-Evaluation:   Nutrition Goals Discharge (Final Nutrition Goals Re-Evaluation):   Psychosocial: Target Goals: Acknowledge presence or absence of significant depression and/or stress, maximize coping skills, provide positive support system. Participant is able to verbalize types and ability to use techniques and skills needed for reducing stress and depression.  Initial Review & Psychosocial Screening: Initial Psych Review & Screening - 03/22/18 1202      Initial Review   Current issues with  None Identified      Family Dynamics   Good Support System?  Yes   Pt lists his spouse and family as sources of support.      Barriers   Psychosocial barriers to participate in program  There are no identifiable barriers or psychosocial needs.      Screening Interventions   Interventions  Encouraged to exercise       Quality of Life Scores: Quality of Life - 03/22/18 1115      Quality of Life   Select  Quality of Life      Quality of Life Scores   Health/Function Pre  28.3 %    Socioeconomic Pre  26.43 %    Psych/Spiritual Pre  30 %    Family Pre  29.5 %    GLOBAL Pre  28.44 %      Scores of 19 and below usually indicate a poorer quality of life in these areas.  A difference of  2-3 points is a clinically meaningful difference.  A difference of 2-3 points in the total score of the Quality of Life Index has been associated with significant improvement in overall quality of life, self-image, physical symptoms, and general health in studies assessing change in quality of life.  PHQ-9: Recent  Review Flowsheet Data    Depression screen [facility] 2/9 05/09/2017 05/09/2017 05/05/2016 05/05/2016 05/06/2015   Decreased Interest 0 0 0 0 0   Down, Depressed, Hopeless 0 0 0 0 0   PHQ - 2 Score 0 0 0 0 0     Interpretation of Total Score  Total Score Depression Severity:  1-4 = Minimal depression, 5-9 = Mild depression, 10-14 = Moderate depression, 15-19 = Moderately severe depression, 20-27 = Severe depression   Psychosocial Evaluation and Intervention:   Psychosocial Re-Evaluation:   Psychosocial Discharge (Final Psychosocial Re-Evaluation):   Vocational Rehabilitation: Provide vocational rehab assistance to qualifying candidates.   Vocational Rehab Evaluation & Intervention: Vocational Rehab - 03/22/18 1202      Initial Vocational Rehab Evaluation & Intervention   Assessment shows need for Vocational Rehabilitation  No       Education: Education Goals: Education classes will be provided on a weekly basis, covering required topics. Participant will state understanding/return demonstration of topics presented.  Learning Barriers/Preferences: Learning Barriers/Preferences - 03/22/18 1118      Learning Barriers/Preferences   Learning Barriers  Sight;Hearing    Learning Preferences  Pictoral;Video;Skilled Demonstration       Education Topics: Count Your Pulse:  -Group instruction provided by verbal instruction, demonstration, patient participation and written materials to support subject.  Instructors address importance of being able to find your pulse and how to count your pulse when at home without a heart monitor.  Patients get hands on experience counting their pulse with staff help and individually.   Heart Attack, Angina, and Risk Factor Modification:  -Group instruction provided by verbal instruction, video, and written materials to support subject.  Instructors address signs and symptoms of angina and heart attacks.    Also discuss risk factors for heart disease and how to  make changes to improve heart health risk factors.   Functional Fitness:  -Group instruction provided by verbal instruction, demonstration, patient participation, and written materials to support subject.  Instructors address safety measures for doing things around the house.  Discuss how to get up and down off the floor, how to pick things up properly, how to safely get out of a chair without assistance, and balance training.   Meditation and Mindfulness:  -Group instruction provided by verbal instruction, patient participation, and written materials to support subject.  Instructor addresses importance of mindfulness and meditation practice to help reduce stress and improve awareness.  Instructor also leads participants through a meditation exercise.    Stretching for Flexibility and Mobility:  -Group instruction provided by verbal instruction, patient participation, and written materials to support subject.  Instructors lead participants through series of stretches that are designed to increase flexibility thus improving mobility.  These stretches are additional exercise for major muscle groups that are typically performed during regular warm up and cool down.   Hands Only CPR:  -Group verbal, video, and participation provides a basic overview of AHA guidelines for community CPR. Role-play of emergencies allow participants the opportunity to practice calling for help and chest compression technique with discussion of AED use.   Hypertension: -Group verbal and written instruction that provides a basic overview of hypertension including the most recent diagnostic guidelines, risk factor reduction with self-care instructions and medication management.    Nutrition I class: Heart Healthy Eating:  -Group instruction provided by PowerPoint slides, verbal discussion, and written materials to support subject matter. The instructor gives an explanation and review of the Therapeutic Lifestyle  Changes diet recommendations, which includes a discussion on lipid goals, dietary fat, sodium, fiber, plant stanol/sterol esters, sugar, and the components of a well-balanced, healthy diet.   Nutrition II class: Lifestyle Skills:  -Group instruction provided by PowerPoint slides, verbal discussion, and written materials to support subject matter. The instructor gives an explanation and review of label reading, grocery shopping for heart  health, heart healthy recipe modifications, and ways to make healthier choices when eating out.   Diabetes Question & Answer:  -Group instruction provided by PowerPoint slides, verbal discussion, and written materials to support subject matter. The instructor gives an explanation and review of diabetes co-morbidities, pre- and post-prandial blood glucose goals, pre-exercise blood glucose goals, signs, symptoms, and treatment of hypoglycemia and hyperglycemia, and foot care basics.   Diabetes Blitz:  -Group instruction provided by PowerPoint slides, verbal discussion, and written materials to support subject matter. The instructor gives an explanation and review of the physiology behind type 1 and type 2 diabetes, diabetes medications and rational behind using different medications, pre- and post-prandial blood glucose recommendations and Hemoglobin A1c goals, diabetes diet, and exercise including blood glucose guidelines for exercising safely.    Portion Distortion:  -Group instruction provided by PowerPoint slides, verbal discussion, written materials, and food models to support subject matter. The instructor gives an explanation of serving size versus portion size, changes in portions sizes over the last 20 years, and what consists of a serving from each food group.   Stress Management:  -Group instruction provided by verbal instruction, video, and written materials to support subject matter.  Instructors review role of stress in heart disease and how to cope  with stress positively.     Exercising on Your Own:  -Group instruction provided by verbal instruction, power point, and written materials to support subject.  Instructors discuss benefits of exercise, components of exercise, frequency and intensity of exercise, and end points for exercise.  Also discuss use of nitroglycerin and activating EMS.  Review options of places to exercise outside of rehab.  Review guidelines for sex with heart disease.   Cardiac Drugs I:  -Group instruction provided by verbal instruction and written materials to support subject.  Instructor reviews cardiac drug classes: antiplatelets, anticoagulants, beta blockers, and statins.  Instructor discusses reasons, side effects, and lifestyle considerations for each drug class.   Cardiac Drugs II:  -Group instruction provided by verbal instruction and written materials to support subject.  Instructor reviews cardiac drug classes: angiotensin converting enzyme inhibitors (ACE-I), angiotensin II receptor blockers (ARBs), nitrates, and calcium channel blockers.  Instructor discusses reasons, side effects, and lifestyle considerations for each drug class.   Anatomy and Physiology of the Circulatory System:  Group verbal and written instruction and models provide basic cardiac anatomy and physiology, with the coronary electrical and arterial systems. Review of: AMI, Angina, Valve disease, Heart Failure, Peripheral Artery Disease, Cardiac Arrhythmia, Pacemakers, and the ICD.   Other Education:  -Group or individual verbal, written, or video instructions that support the educational goals of the cardiac rehab program.   Holiday Eating Survival Tips:  -Group instruction provided by PowerPoint slides, verbal discussion, and written materials to support subject matter. The instructor gives patients tips, tricks, and techniques to help them not only survive but enjoy the holidays despite the onslaught of food that accompanies the  holidays.   Knowledge Questionnaire Score: Knowledge Questionnaire Score - 03/22/18 1118      Knowledge Questionnaire Score   Pre Score  19/24       Core Components/Risk Factors/Patient Goals at Admission: Personal Goals and Risk Factors at Admission - 03/22/18 1118      Core Components/Risk Factors/Patient Goals on Admission    Weight Management  Yes;Weight Maintenance;Weight Loss    Intervention  Weight Management: Develop a combined nutrition and exercise program designed to reach desired caloric intake, while maintaining appropriate intake of nutrient  and fiber, sodium and fats, and appropriate energy expenditure required for the weight goal.;Weight Management: Provide education and appropriate resources to help participant work on and attain dietary goals.;Weight Management/Obesity: Establish reasonable short term and long term weight goals.    Admit Weight  192 lb 0.3 oz (87.1 kg)    Expected Outcomes  Short Term: Continue to assess and modify interventions until short term weight is achieved;Long Term: Adherence to nutrition and physical activity/exercise program aimed toward attainment of established weight goal;Weight Maintenance: Understanding of the daily nutrition guidelines, which includes 25-35% calories from fat, 7% or less cal from saturated fats, less than 200mg  cholesterol, less than 1.5gm of sodium, & 5 or more servings of fruits and vegetables daily;Weight Loss: Understanding of general recommendations for a balanced deficit meal plan, which promotes 1-2 lb weight loss per week and includes a negative energy balance of [phone] kcal/d;Understanding of distribution of calorie intake throughout the day with the consumption of 4-5 meals/snacks;Understanding recommendations for meals to include 15-35% energy as protein, 25-35% energy from fat, 35-60% energy from carbohydrates, less than 200mg  of dietary cholesterol, 20-35 gm of total fiber daily    Hypertension  Yes     Intervention  Provide education on lifestyle modifcations including regular physical activity/exercise, weight management, moderate sodium restriction and increased consumption of fresh fruit, vegetables, and low fat dairy, alcohol moderation, and smoking cessation.;Monitor prescription use compliance.    Expected Outcomes  Short Term: Continued assessment and intervention until BP is < 140/86mm HG in hypertensive participants. < 130/64mm HG in hypertensive participants with diabetes, heart failure or chronic kidney disease.;Long Term: Maintenance of blood pressure at goal levels.    Lipids  Yes    Intervention  Provide education and support for participant on nutrition & aerobic/resistive exercise along with prescribed medications to achieve LDL 70mg , HDL >40mg .    Expected Outcomes  Short Term: Participant states understanding of desired cholesterol values and is compliant with medications prescribed. Participant is following exercise prescription and nutrition guidelines.;Long Term: Cholesterol controlled with medications as prescribed, with individualized exercise RX and with personalized nutrition plan. Value goals: LDL < 70mg , HDL > 40 mg.    Stress  Yes    Intervention  Offer individual and/or small group education and counseling on adjustment to heart disease, stress management and health-related lifestyle change. Teach and support self-help strategies.;Refer participants experiencing significant psychosocial distress to appropriate mental health specialists for further evaluation and treatment. When possible, include family members and significant others in education/counseling sessions.    Expected Outcomes  Short Term: Participant demonstrates changes in health-related behavior, relaxation and other stress management skills, ability to obtain effective social support, and compliance with psychotropic medications if prescribed.;Long Term: Emotional wellbeing is indicated by absence of clinically  significant psychosocial distress or social isolation.       Core Components/Risk Factors/Patient Goals Review:    Core Components/Risk Factors/Patient Goals at Discharge (Final Review):    ITP Comments: ITP Comments    Row Name 03/22/18 1158           ITP Comments  Dr. Fransico Him. Medical Director          Comments: Patient attended orientation from [phone] to [phone] to review rules and guidelines for program. Completed 6 minute walk test, Intitial ITP, and exercise prescription.  VSS. Telemetry-SR.  Asymptomatic. Pt reported ongoing chest pressure and DOE when arriving to Cardiac Rehab this morning.  During 6MWT, pt did not report any chest pressure. [name] notified.

## 2018-03-26 NOTE — Progress Notes (Signed)
Cardiology Office Note   Date:  03/29/2018   ID:  [name] Minturn., DOB 09-19-1945, MRN [id number]  PCP:  [name], MD  Cardiologist:  [name]  No chief complaint on file.    History of Present Illness:  73 y.o. HLD, DM, HTN and CAD. Stent to circumflex 01/17/18 good result Single vessel disease had some recurrent pain post intervention and placed on imdur. F/U myovue 03/01/18 non ischemia low risk EF 60%.  Had some myalgias on higher dose crestor and dose decrease by NP on 02/22/18 Swims 3-4 x/ week  He continues to have SSCP and exertional dyspnea. Not all of his symptoms are with activity. Imdur has helped. Started cardiac rehab and really enjoys it  Discussed options. I am concerned that he continues to have exertional chest pain and dyspnea similar to pre stent. Despite low risk myovue feel cath needed to r/o ISR He is in agreement Risks discussed and known from recent intervention    Past Medical History:  Diagnosis Date  . ABNORMAL EKG 10/07/2008  . ADVEF, DRUG/MED/BIOL SUBST, OTHER DRUG NOS 09/26/2006  . Allergy    seasonal  . Cataract    left eye-surgery 07-2015  . DEGENERATIVE JOINT DISEASE, MODERATE 04/13/2007  . Diabetes mellitus    type 2, diet controlled   . DIVERTICULOSIS, COLON 04/13/2007  . ELEVATED PROSTATE SPECIFIC ANTIGEN 04/14/2007  . Glaucoma    sees [name]   . GOUT 05/16/2007  . HYPERLIPIDEMIA 09/26/2006  . HYPERTENSION 04/13/2007  . JOINT EFFUSION, KNEE 06/15/2009  . Melanoma [facility])    sees [name]   . PROSTATE CANCER 10/05/2009   sees [name]  . Renal insufficiency    RESOLVED PER PATIENT    Past Surgical History:  Procedure Laterality Date  . ACROMIOPLASTY Right 05-06-14   per [name]   . BUNIONECTOMY     bilateral  . CATARACT EXTRACTION Left    per [name]   . COLONOSCOPY  07/10/2015   per [name], benign polyp, repeat in 10 yrs   . CORONARY STENT INTERVENTION N/A 01/17/2018   Procedure: CORONARY STENT INTERVENTION;   Surgeon: [name], MD;  Location: [place] [facility];  Service: Cardiovascular;  Laterality: N/A;  . CYSTECTOMY     benign from hand  . GLAUCOMA SURGERY Bilateral 2014   . INSERTION OF MESH N/A 01/10/2013   Procedure: INSERTION OF MESH;  Surgeon: Earnstine Regal, MD;  Location: [place];  Service: General;  Laterality: N/A;  . KNEE ARTHROSCOPY Right Jan. 2013   right knee, per [name]   . KNEE ARTHROSCOPY Left 01-14-15   per [name]   . LEFT HEART CATH AND CORONARY ANGIOGRAPHY N/A 01/17/2018   Procedure: LEFT HEART CATH AND CORONARY ANGIOGRAPHY;  Surgeon: [name], MD;  Location: [place] [facility];  Service: Cardiovascular;  Laterality: N/A;  . MELANOMA EXCISION  2012   per [name]  . PROSTATECTOMY     per [name]  . TONSILLECTOMY    . UMBILICAL HERNIA REPAIR  11/01/2011   Procedure: HERNIA REPAIR UMBILICAL ADULT;  Surgeon: Earnstine Regal, MD;  Location: [place];  Service: General;  Laterality: N/A;  Repair umbilical hernia with mesh patch  . VASECTOMY    . VENTRAL HERNIA REPAIR  01/10/2013   with mesh     [name]  . VENTRAL HERNIA REPAIR N/A 01/10/2013   Procedure: HERNIA REPAIR VENTRAL ADULT ;  Surgeon: Earnstine Regal, MD;  Location: [place];  Service: General;  Laterality: N/A;     Current Outpatient Medications  Medication Sig Dispense Refill  . aspirin EC 81 MG tablet Take 81 mg by mouth every evening.     . bimatoprost (LUMIGAN) 0.01 % SOLN Place 1 drop into both eyes at bedtime.    . clopidogrel (PLAVIX) 75 MG tablet Take 1 tablet (75 mg total) by mouth daily with breakfast. 90 tablet 8  . Coenzyme Q10 (COQ-10) 100 MG CAPS Take 100 mg by mouth every evening.     . dorzolamide-timolol (COSOPT) 22.3-6.8 MG/ML ophthalmic solution Place 1 drop into the right eye 2 (two) times daily.    . fenofibrate 160 MG tablet Take 1 tablet (160 mg total) by mouth daily. (Patient taking differently: Take 160 mg by mouth every evening. ) 90 tablet 3  .  isosorbide mononitrate (IMDUR) 30 MG 24 hr tablet Take 1 tablet (30 mg total) by mouth daily. 90 tablet 3  . lisinopril (PRINIVIL,ZESTRIL) 20 MG tablet Take 1 tablet (20 mg total) by mouth daily. 90 tablet 3  . magnesium oxide (MAG-OX) 400 MG tablet Take 400 mg by mouth every evening.     . Multiple Vitamins-Minerals (MULTIVITAMIN WITH MINERALS) tablet Take 1 tablet by mouth 2 (two) times a week. Sundays and wednesdays    . nitroGLYCERIN (NITROSTAT) 0.4 MG SL tablet Place 1 tablet (0.4 mg total) under the tongue every 5 (five) minutes as needed. (Patient taking differently: Place 0.4 mg under the tongue every 5 (five) minutes as needed for chest pain. ) 25 tablet 3  . rosuvastatin (CRESTOR) 20 MG tablet Take 20 mg by mouth daily.     No current facility-administered medications for this visit.     Allergies:   Patient has no known allergies.    Social History:  The patient  reports that he quit smoking about 46 years ago. He started smoking about 55 years ago. He has a 9.00 pack-year smoking history. He has never used smokeless tobacco. He reports that he does not drink alcohol or use drugs.   Family History:  The patient's family history includes Heart failure in his father; Stroke in his mother.    ROS: All other systems are reviewed and negative. Unless otherwise mentioned in H&P    PHYSICAL EXAM: VS:  BP 118/80   Pulse 66   Ht 5' 6.5" (1.689 m)   Wt 191 lb (86.6 kg)   SpO2 98%   BMI 30.37 kg/m  , BMI Body mass index is 30.37 kg/m. Affect appropriate Healthy:  appears stated age 83: normal Neck supple with no adenopathy JVP normal no bruits no thyromegaly Lungs clear with no wheezing and good diaphragmatic motion Heart:  S1/S2 no murmur, no rub, gallop or click PMI normal Abdomen: benighn, BS positve, no tenderness, no AAA no bruit.  No HSM or HJR Distal pulses intact with no bruits No edema Neuro non-focal Skin warm and dry No muscular weakness   EKG:   02/22/18  SB rate 47 nonspecific ST changes   Recent Labs: 11/07/2017: ALT 17; TSH 3.11 01/30/2018: Hemoglobin 14.8; Platelets 227 02/21/2018: BUN 14; Creatinine, Ser 1.43; Potassium 4.6; Sodium 143    Lipid Panel    Component Value Date/Time   CHOL 163 11/07/2017 1133   TRIG 165.0 (H) 11/07/2017 1133   HDL 43.10 11/07/2017 1133   CHOLHDL 4 11/07/2017 1133   VLDL 33.0 11/07/2017 1133   LDLCALC 87 11/07/2017  1133   LDLDIRECT 82.1 04/07/2011 0807      Wt Readings from Last 3 Encounters:  03/29/18 191 lb (86.6 kg)  03/22/18 192 lb 0.3 oz (87.1 kg)  03/01/18 192 lb (87.1 kg)      Other studies Reviewed: Cardiac Cath 01/17/2018    CULPRIT LESION: Prox Cx to Mid Cx lesion is 70% stenosed.  A drug-eluting stent was successfully placed using a STENT SIERRA 2.75 X 15 MM -postdilated to 3.0 mm with stent balloon  Post intervention, there is a 0% residual stenosis.  Mid Cx lesion is 35% stenosed.  Normal LVEDP with no aortic stenosis.   SUMMARY  Significant single-vessel disease as predicted by CT, mid circumflex 70% stenosis followed by a more distal 40% stenosis.  FFR 0.78.  --Successful PCI with Xience DES 2.75 mm x 15 mm (3.0 mm)  Otherwise minimal CAD.  Normal LVEDP.  Normal EF by echo.   RECOMMENDATIONS  Uncomplicated single-vessel PCI with no significant chest pain.  Should be fine for same-day discharge.  Increased statin dose to 20 mg daily  Not on beta-blocker because of bradycardia, pending blood pressure in the outpatient setting, consider calcium channel blocker for antianginal effect.  Recommend uninterrupted dual antiplatelet therapy with Aspirin 81mg  daily and Clopidogrel 75mg  daily for a minimum of 6 months (stable ischemic heart disease - Class I recommendation).         ASSESSMENT AND PLAN:  1. CAD: 01/17/18 stent to the prox to mid Cx Single vessel disease with good result . He was been placed on isosorbide 15 mg but persistent symptoms F/U  myovue done 03/01/18 low risk with no ischemia EF 60% continue medical Rx Given persistent anginal symptoms despite escalation in medication will proceed with heart cath to r/o ISR Patient wishes to have next Friday as wife will be out of town till then   2. Myalgia Pain: Crestor dose decreased to 10 mg when seen by NP 02/22/18   3. Hypertension: Well controlled.  Continue current medications and low sodium Dash type diet.     4. HLD:  ? Myalgias from statin crestor dose recently decreased observe   Current medicines are reviewed at length with the patient today.    Labs/ tests ordered today include: *Pre cath  Time spent discussing options with patient and arranging heart cath with labs and orders 45 minutes   Jenkins Rouge

## 2018-03-26 NOTE — H&P (View-Only) (Signed)
Cardiology Office Note   Date:  03/29/2018   ID:  [name] [name]., DOB Jul 28, 1945, MRN [id number]  PCP:  [name], MD  Cardiologist:  [name]  No chief complaint on file.    History of Present Illness:  73 y.o. HLD, DM, HTN and CAD. Stent to circumflex 01/17/18 good result Single vessel disease had some recurrent pain post intervention and placed on imdur. F/U myovue 03/01/18 non ischemia low risk EF 60%.  Had some myalgias on higher dose crestor and dose decrease by NP on 02/22/18 Swims 3-4 x/ week  He continues to have SSCP and exertional dyspnea. Not all of his symptoms are with activity. Imdur has helped. Started cardiac rehab and really enjoys it  Discussed options. I am concerned that he continues to have exertional chest pain and dyspnea similar to pre stent. Despite low risk myovue feel cath needed to r/o ISR He is in agreement Risks discussed and known from recent intervention    Past Medical History:  Diagnosis Date  . ABNORMAL EKG 10/07/2008  . ADVEF, DRUG/MED/BIOL SUBST, OTHER DRUG NOS 09/26/2006  . Allergy    seasonal  . Cataract    left eye-surgery 07-2015  . DEGENERATIVE JOINT DISEASE, MODERATE 04/13/2007  . Diabetes mellitus    type 2, diet controlled   . DIVERTICULOSIS, COLON 04/13/2007  . ELEVATED PROSTATE SPECIFIC ANTIGEN 04/14/2007  . Glaucoma    sees [name]   . GOUT 05/16/2007  . HYPERLIPIDEMIA 09/26/2006  . HYPERTENSION 04/13/2007  . JOINT EFFUSION, KNEE 06/15/2009  . Melanoma [facility])    sees [name]   . PROSTATE CANCER 10/05/2009   sees [name]  . Renal insufficiency    RESOLVED PER PATIENT    Past Surgical History:  Procedure Laterality Date  . ACROMIOPLASTY Right 05-06-14   per [name]   . BUNIONECTOMY     bilateral  . CATARACT EXTRACTION Left    per [name]   . COLONOSCOPY  07/10/2015   per [name], benign polyp, repeat in 10 yrs   . CORONARY STENT INTERVENTION N/A 01/17/2018   Procedure: CORONARY STENT INTERVENTION;   Surgeon: [name], MD;  Location: [place] [facility];  Service: Cardiovascular;  Laterality: N/A;  . CYSTECTOMY     benign from hand  . GLAUCOMA SURGERY Bilateral 2014   . INSERTION OF MESH N/A 01/10/2013   Procedure: INSERTION OF MESH;  Surgeon: Earnstine Regal, MD;  Location: [place];  Service: General;  Laterality: N/A;  . KNEE ARTHROSCOPY Right Jan. 2013   right knee, per [name]   . KNEE ARTHROSCOPY Left 01-14-15   per [name]   . LEFT HEART CATH AND CORONARY ANGIOGRAPHY N/A 01/17/2018   Procedure: LEFT HEART CATH AND CORONARY ANGIOGRAPHY;  Surgeon: [name], MD;  Location: [place] [facility];  Service: Cardiovascular;  Laterality: N/A;  . MELANOMA EXCISION  2012   per [name]  . PROSTATECTOMY     per [name]  . TONSILLECTOMY    . UMBILICAL HERNIA REPAIR  11/01/2011   Procedure: HERNIA REPAIR UMBILICAL ADULT;  Surgeon: Earnstine Regal, MD;  Location: [place];  Service: General;  Laterality: N/A;  Repair umbilical hernia with mesh patch  . VASECTOMY    . VENTRAL HERNIA REPAIR  01/10/2013   with mesh     [name]  . VENTRAL HERNIA REPAIR N/A 01/10/2013   Procedure: HERNIA REPAIR VENTRAL ADULT ;  Surgeon: Earnstine Regal, MD;  Location: [place];  Service: General;  Laterality: N/A;     Current Outpatient Medications  Medication Sig Dispense Refill  . aspirin EC 81 MG tablet Take 81 mg by mouth every evening.     . bimatoprost (LUMIGAN) 0.01 % SOLN Place 1 drop into both eyes at bedtime.    . clopidogrel (PLAVIX) 75 MG tablet Take 1 tablet (75 mg total) by mouth daily with breakfast. 90 tablet 8  . Coenzyme Q10 (COQ-10) 100 MG CAPS Take 100 mg by mouth every evening.     . dorzolamide-timolol (COSOPT) 22.3-6.8 MG/ML ophthalmic solution Place 1 drop into the right eye 2 (two) times daily.    . fenofibrate 160 MG tablet Take 1 tablet (160 mg total) by mouth daily. (Patient taking differently: Take 160 mg by mouth every evening. ) 90 tablet 3  .  isosorbide mononitrate (IMDUR) 30 MG 24 hr tablet Take 1 tablet (30 mg total) by mouth daily. 90 tablet 3  . lisinopril (PRINIVIL,ZESTRIL) 20 MG tablet Take 1 tablet (20 mg total) by mouth daily. 90 tablet 3  . magnesium oxide (MAG-OX) 400 MG tablet Take 400 mg by mouth every evening.     . Multiple Vitamins-Minerals (MULTIVITAMIN WITH MINERALS) tablet Take 1 tablet by mouth 2 (two) times a week. Sundays and wednesdays    . nitroGLYCERIN (NITROSTAT) 0.4 MG SL tablet Place 1 tablet (0.4 mg total) under the tongue every 5 (five) minutes as needed. (Patient taking differently: Place 0.4 mg under the tongue every 5 (five) minutes as needed for chest pain. ) 25 tablet 3  . rosuvastatin (CRESTOR) 20 MG tablet Take 20 mg by mouth daily.     No current facility-administered medications for this visit.     Allergies:   Patient has no known allergies.    Social History:  The patient  reports that he quit smoking about 46 years ago. He started smoking about 55 years ago. He has a 9.00 pack-year smoking history. He has never used smokeless tobacco. He reports that he does not drink alcohol or use drugs.   Family History:  The patient's family history includes Heart failure in his father; Stroke in his mother.    ROS: All other systems are reviewed and negative. Unless otherwise mentioned in H&P    PHYSICAL EXAM: VS:  BP 118/80   Pulse 66   Ht 5' 6.5" (1.689 m)   Wt 191 lb (86.6 kg)   SpO2 98%   BMI 30.37 kg/m  , BMI Body mass index is 30.37 kg/m. Affect appropriate Healthy:  appears stated age 87: normal Neck supple with no adenopathy JVP normal no bruits no thyromegaly Lungs clear with no wheezing and good diaphragmatic motion Heart:  S1/S2 no murmur, no rub, gallop or click PMI normal Abdomen: benighn, BS positve, no tenderness, no AAA no bruit.  No HSM or HJR Distal pulses intact with no bruits No edema Neuro non-focal Skin warm and dry No muscular weakness   EKG:   02/22/18  SB rate 47 nonspecific ST changes   Recent Labs: 11/07/2017: ALT 17; TSH 3.11 01/30/2018: Hemoglobin 14.8; Platelets 227 02/21/2018: BUN 14; Creatinine, Ser 1.43; Potassium 4.6; Sodium 143    Lipid Panel    Component Value Date/Time   CHOL 163 11/07/2017 1133   TRIG 165.0 (H) 11/07/2017 1133   HDL 43.10 11/07/2017 1133   CHOLHDL 4 11/07/2017 1133   VLDL 33.0 11/07/2017 1133   LDLCALC 87 11/07/2017  1133   LDLDIRECT 82.1 04/07/2011 0807      Wt Readings from Last 3 Encounters:  03/29/18 191 lb (86.6 kg)  03/22/18 192 lb 0.3 oz (87.1 kg)  03/01/18 192 lb (87.1 kg)      Other studies Reviewed: Cardiac Cath 01/17/2018    CULPRIT LESION: Prox Cx to Mid Cx lesion is 70% stenosed.  A drug-eluting stent was successfully placed using a STENT SIERRA 2.75 X 15 MM -postdilated to 3.0 mm with stent balloon  Post intervention, there is a 0% residual stenosis.  Mid Cx lesion is 35% stenosed.  Normal LVEDP with no aortic stenosis.   SUMMARY  Significant single-vessel disease as predicted by CT, mid circumflex 70% stenosis followed by a more distal 40% stenosis.  FFR 0.78.  --Successful PCI with Xience DES 2.75 mm x 15 mm (3.0 mm)  Otherwise minimal CAD.  Normal LVEDP.  Normal EF by echo.   RECOMMENDATIONS  Uncomplicated single-vessel PCI with no significant chest pain.  Should be fine for same-day discharge.  Increased statin dose to 20 mg daily  Not on beta-blocker because of bradycardia, pending blood pressure in the outpatient setting, consider calcium channel blocker for antianginal effect.  Recommend uninterrupted dual antiplatelet therapy with Aspirin 81mg  daily and Clopidogrel 75mg  daily for a minimum of 6 months (stable ischemic heart disease - Class I recommendation).         ASSESSMENT AND PLAN:  1. CAD: 01/17/18 stent to the prox to mid Cx Single vessel disease with good result . He was been placed on isosorbide 15 mg but persistent symptoms F/U  myovue done 03/01/18 low risk with no ischemia EF 60% continue medical Rx Given persistent anginal symptoms despite escalation in medication will proceed with heart cath to r/o ISR Patient wishes to have next Friday as wife will be out of town till then   2. Myalgia Pain: Crestor dose decreased to 10 mg when seen by NP 02/22/18   3. Hypertension: Well controlled.  Continue current medications and low sodium Dash type diet.     4. HLD:  ? Myalgias from statin crestor dose recently decreased observe   Current medicines are reviewed at length with the patient today.    Labs/ tests ordered today include: *Pre cath  Time spent discussing options with patient and arranging heart cath with labs and orders 45 minutes   Jenkins Rouge

## 2018-03-28 ENCOUNTER — Ambulatory Visit (HOSPITAL_COMMUNITY): Payer: Medicare Other

## 2018-03-28 ENCOUNTER — Encounter (HOSPITAL_COMMUNITY)
Admission: RE | Admit: 2018-03-28 | Discharge: 2018-03-28 | Disposition: A | Discharge status: Home / Self Care | Payer: Medicare Other | Source: Ambulatory Visit | Attending: Cardiovascular Disease | Admitting: Cardiovascular Disease

## 2018-03-28 DIAGNOSIS — Z955 Presence of coronary angioplasty implant and graft: Secondary | ICD-10-CM | POA: Diagnosis not present

## 2018-03-28 LAB — GLUCOSE, CAPILLARY
Glucose-Capillary: 140 mg/dL — ABNORMAL HIGH (ref 70–99)
Glucose-Capillary: 91 mg/dL (ref 70–99)

## 2018-03-28 NOTE — Progress Notes (Signed)
Daily Session Note  Patient Details  Name: [name] [name]. MRN: [id number] Date of Birth: 01-02-1946 Referring Provider:     CARDIAC REHAB PHASE II ORIENTATION from 03/22/2018 in [place]  Referring Provider  [name]      Encounter Date: 03/28/2018  Check In: Session Check In - 03/28/18 1510      Check-In   Supervising physician immediately available to respond to emergencies  [facility] immediately available    Physician(s)  Dr. Ree Kida    Location  [facility]    Staff Present  Barnet Pall, RN, Mosie Epstein, MS,ACSM CEP, Exercise Physiologist;Brittany Durene Fruits, BS, ACSM CEP, Exercise Physiologist;Olinty Celesta Aver, MS, ACSM CEP, Exercise Physiologist    Medication changes reported      No    Fall or balance concerns reported     No    Tobacco Cessation  No Change    Warm-up and Cool-down  Performed as group-led instruction    Resistance Training Performed  No    VAD Patient?  No    PAD/SET Patient?  No      Pain Assessment   Currently in Pain?  No/denies    Pain Score  0-No pain    Multiple Pain Sites  No       Capillary Blood Glucose: Results for orders placed or performed during the hospital encounter of 03/28/18 (from the past 24 hour(s))  Glucose, capillary     Status: None   Collection Time: 03/28/18  3:53 PM  Result Value Ref Range   Glucose-Capillary 91 70 - 99 mg/dL      Social History   Tobacco Use  Smoking Status Former Smoker  . Packs/day: 1.00  . Years: 9.00  . Pack years: 9.00  . Start date: 03/08/1963  . Last attempt to quit: 03/07/1972  . Years since quitting: 46.0  Smokeless Tobacco Never Used  Tobacco Comment   States AAA completed     Goals Met:  Exercise tolerated well  Goals Unmet:  Not Applicable  Comments: Pt started cardiac rehab today.  Pt tolerated light exercise without difficulty. Resting blood pressures within normal limits. Maximum exertional blood pressure  noted at 172/60 on the treadmill with a re check blood pressure of 142/82., telemetry-Sinus Rhythm , asymptomatic.  Medication list reconciled. Pt denies barriers to medicaiton compliance.  PSYCHOSOCIAL ASSESSMENT:  PHQ-0. Pt exhibits positive coping skills, hopeful outlook with supportive family. No psychosocial needs identified at this time, no psychosocial interventions necessary.    Pt enjoys swimming.   Pt oriented to exercise equipment and routine.    Understanding verbalized. Rondell CBG's were 140 pre exercise. 19 post. [name] [name] is a diet controlled diabetic. Vishwa said he experienced some shortness of breath on the treadmill. Oxygen saturation 97% on room air. [name] [name] has a follow up appointment with Dr Johnsie Cancel tomorrow. Patient given a copy of today's exercise flow sheet to take with him to his appointment.Barnet Pall, RN,BSN 03/28/2018 4:19 PM    Dr. Fransico Him is Medical Director for Cardiac Rehab at [facility].

## 2018-03-29 ENCOUNTER — Other Ambulatory Visit: Payer: Self-pay | Admitting: Cardiovascular Disease

## 2018-03-29 ENCOUNTER — Ambulatory Visit (INDEPENDENT_AMBULATORY_CARE_PROVIDER_SITE_OTHER): Payer: Medicare Other | Admitting: Cardiovascular Disease

## 2018-03-29 ENCOUNTER — Encounter: Payer: Self-pay | Admitting: Cardiovascular Disease

## 2018-03-29 VITALS — BP 118/80 | HR 66 | Ht 66.5 in | Wt 191.0 lb

## 2018-03-29 DIAGNOSIS — I25118 Atherosclerotic heart disease of native coronary artery with other forms of angina pectoris: Secondary | ICD-10-CM | POA: Diagnosis not present

## 2018-03-29 DIAGNOSIS — I209 Angina pectoris, unspecified: Secondary | ICD-10-CM

## 2018-03-29 LAB — BASIC METABOLIC PANEL
BUN / CREAT RATIO: 13 (ref 10–24)
BUN: 21 mg/dL (ref 8–27)
CALCIUM: 9.9 mg/dL (ref 8.6–10.2)
CHLORIDE: 104 mmol/L (ref 96–106)
CO2: 20 mmol/L (ref 20–29)
Creatinine, Ser: 1.57 mg/dL — ABNORMAL HIGH (ref 0.76–1.27)
GFR calc non Af Amer: 44 mL/min/{1.73_m2} — ABNORMAL LOW (ref >59)
GFR, EST AFRICAN AMERICAN: 51 mL/min/{1.73_m2} — AB (ref >59)
Glucose: 103 mg/dL — ABNORMAL HIGH (ref 65–99)
POTASSIUM: 4.5 mmol/L (ref 3.5–5.2)
Sodium: 141 mmol/L (ref 134–144)

## 2018-03-29 LAB — CBC WITH DIFFERENTIAL/PLATELET
BASOS ABS: 0.1 10*3/uL (ref 0.0–0.2)
Basos: 1 %
EOS (ABSOLUTE): 0.1 10*3/uL (ref 0.0–0.4)
Eos: 1 %
Hematocrit: 43.7 % (ref 37.5–51.0)
Hemoglobin: 14.9 g/dL (ref 13.0–17.7)
Immature Grans (Abs): 0 10*3/uL (ref 0.0–0.1)
Immature Granulocytes: 0 %
Lymphocytes Absolute: 2.1 10*3/uL (ref 0.7–3.1)
Lymphs: 28 %
MCH: 29.9 pg (ref 26.6–33.0)
MCHC: 34.1 g/dL (ref 31.5–35.7)
MCV: 88 fL (ref 79–97)
MONOS ABS: 0.8 10*3/uL (ref 0.1–0.9)
Monocytes: 10 %
NEUTROS ABS: 4.6 10*3/uL (ref 1.4–7.0)
Neutrophils: 60 %
PLATELETS: 243 10*3/uL (ref 150–450)
RBC: 4.98 x10E6/uL (ref 4.14–5.80)
RDW: 13.3 % (ref 11.6–15.4)
WBC: 7.7 10*3/uL (ref 3.4–10.8)

## 2018-03-29 NOTE — Patient Instructions (Signed)
Medication Instructions:   If you need a refill on your cardiac medications before your next appointment, please call your pharmacy.   Lab work: Your physician recommends that you have lab work today- BMET and CBC  If you have labs (blood work) drawn today and your tests are completely normal, you will receive your results only by: Marland Kitchen MyChart Message (if you have MyChart) OR . A paper copy in the mail If you have any lab test that is abnormal or we need to change your treatment, we will call you to review the results.  Testing/Procedures: Your physician has requested that you have a cardiac catheterization. Cardiac catheterization is used to diagnose and/or treat various heart conditions. Doctors may recommend this procedure for a number of different reasons. The most common reason is to evaluate chest pain. Chest pain can be a symptom of coronary artery disease (CAD), and cardiac catheterization can show whether plaque is narrowing or blocking your heart's arteries. This procedure is also used to evaluate the valves, as well as measure the blood flow and oxygen levels in different parts of your heart. For further information please visit [email or web address]. Please follow instruction sheet, as given.  Follow-Up: At [facility], you and your health needs are our priority.  As part of our continuing mission to provide you with exceptional heart care, we have created designated Provider Care Teams.  These Care Teams include your primary Cardiologist (physician) and Advanced Practice Providers (APPs -  Physician Assistants and Nurse Practitioners) who all work together to provide you with the care you need, when you need it. You will need a follow up appointment in 3 weeks.  Please call our office 2 months in advance to schedule this appointment.  You may see Jenkins Rouge, MD or one of the following Advanced Practice Providers on your designated Care Team:   Truitt Merle, NP Cecilie Kicks,  NP . Kathyrn Drown, NP     [place] OFFICE [place], [place] [address] Dept: [phone] Loc: [phone]  Broderic Bara.  03/29/2018  You are scheduled for a Cardiac Catheterization on Friday, January 31 with Dr. Daneen Schick.  1. Please arrive at the [facility] (Main Entrance A) at [facility]: [address] [place], [place] [address] at 5:30 AM (This time is two hours before your procedure to ensure your preparation). Free valet parking service is available.   Special note: Every effort is made to have your procedure done on time. Please understand that emergencies sometimes delay scheduled procedures.  2. Diet: Do not eat solid foods after midnight.  The patient may have clear liquids until 5am upon the day of the procedure.  3. Labs: You will need to have blood drawn on Thursday, January 23 at [facility] at [facility]. [address]  Open: 7:30am - 5pm    Phone: [phone]. You do not need to be fasting.  4. Medication instructions in preparation for your procedure:   Contrast Allergy: No   On the morning of your procedure, take your Aspirin, Plavix and any morning medicines NOT listed above.  You may use sips of water.  5. Plan for one night stay--bring personal belongings. 6. Bring a current list of your medications and current insurance cards. 7. You MUST have a responsible person to drive you home. 8. Someone MUST be with you the first 24 hours after  you arrive home or your discharge will be delayed. 9. Please wear clothes that are easy to get on and off and wear slip-on shoes.  Thank you for allowing [place] to care for you!   -- [place] [facility]

## 2018-03-30 ENCOUNTER — Encounter (HOSPITAL_COMMUNITY)
Admission: RE | Admit: 2018-03-30 | Discharge: 2018-03-30 | Disposition: A | Discharge status: Home / Self Care | Payer: Medicare Other | Source: Ambulatory Visit | Attending: Cardiovascular Disease | Admitting: Cardiovascular Disease

## 2018-03-30 ENCOUNTER — Ambulatory Visit (HOSPITAL_COMMUNITY): Payer: Medicare Other

## 2018-03-30 DIAGNOSIS — Z955 Presence of coronary angioplasty implant and graft: Secondary | ICD-10-CM

## 2018-03-30 NOTE — Progress Notes (Signed)
Incomplete Session Note  Patient Details  Name: [name] [name]. MRN: [id number] Date of Birth: [birth year] Referring Provider:     CARDIAC REHAB PHASE II ORIENTATION from 03/22/2018 in [place]  Referring Provider  Dr. Gentry Roch D Berniece Andreas. did not complete his rehab session.  Will have Mr Barajas hold off on exercising at cardiac rehab until after heart catheterization. Patient states understanding.Barnet Pall, RN,BSN 03/30/2018 2:36 PM

## 2018-04-02 ENCOUNTER — Ambulatory Visit (HOSPITAL_COMMUNITY): Payer: Medicare Other

## 2018-04-02 ENCOUNTER — Encounter (HOSPITAL_COMMUNITY): Payer: Medicare Other

## 2018-04-04 ENCOUNTER — Ambulatory Visit (HOSPITAL_COMMUNITY): Payer: Medicare Other

## 2018-04-04 ENCOUNTER — Encounter (HOSPITAL_COMMUNITY): Payer: Medicare Other

## 2018-04-05 ENCOUNTER — Telehealth: Payer: Self-pay | Admitting: *Deleted

## 2018-04-05 NOTE — Telephone Encounter (Signed)
Pt contacted pre-catheterization scheduled at [facility] for: Friday April 06, 2018 10:30 AM Verified arrival time and place: [place] Entrance A at: 5:30 AM-pre procedure hydration.  No solid food after midnight prior to cath, clear liquids until 5 AM day of procedure. Contrast allergy: no Verified no diabetes medications.  Hold: Lisinopril- day of procedure  Except hold medications AM meds can be  taken pre-cath with sip of water including: ASA 81 mg Clopidogrel 75 mg  Confirmed patient has responsible person to drive home post procedure and for observe 24 hours after you arrive home: yes

## 2018-04-06 ENCOUNTER — Ambulatory Visit (HOSPITAL_COMMUNITY): Payer: Medicare Other

## 2018-04-06 ENCOUNTER — Other Ambulatory Visit: Payer: Self-pay

## 2018-04-06 ENCOUNTER — Encounter (HOSPITAL_COMMUNITY): Payer: Self-pay | Admitting: Interventional Cardiology

## 2018-04-06 ENCOUNTER — Encounter (HOSPITAL_COMMUNITY)
Admission: RE | Admit: 2018-04-06 | Discharge: 2018-04-06 | Disposition: A | Discharge status: Home / Self Care | Payer: Medicare Other | Source: Ambulatory Visit | Attending: Cardiovascular Disease | Admitting: Cardiovascular Disease

## 2018-04-06 ENCOUNTER — Ambulatory Visit (HOSPITAL_COMMUNITY)
Admission: RE | Admit: 2018-04-06 | Discharge: 2018-04-06 | Disposition: A | Discharge status: Home / Self Care | Payer: Medicare Other | Attending: Interventional Cardiology | Admitting: Interventional Cardiology

## 2018-04-06 ENCOUNTER — Encounter (HOSPITAL_COMMUNITY)
Admission: RE | Disposition: A | Discharge status: Home / Self Care | Payer: Self-pay | Source: Home / Self Care | Attending: Interventional Cardiology

## 2018-04-06 DIAGNOSIS — Z8249 Family history of ischemic heart disease and other diseases of the circulatory system: Secondary | ICD-10-CM | POA: Diagnosis not present

## 2018-04-06 DIAGNOSIS — Z823 Family history of stroke: Secondary | ICD-10-CM | POA: Diagnosis not present

## 2018-04-06 DIAGNOSIS — Z9852 Vasectomy status: Secondary | ICD-10-CM | POA: Insufficient documentation

## 2018-04-06 DIAGNOSIS — Z87891 Personal history of nicotine dependence: Secondary | ICD-10-CM | POA: Diagnosis not present

## 2018-04-06 DIAGNOSIS — Z7982 Long term (current) use of aspirin: Secondary | ICD-10-CM | POA: Insufficient documentation

## 2018-04-06 DIAGNOSIS — Z79899 Other long term (current) drug therapy: Secondary | ICD-10-CM | POA: Insufficient documentation

## 2018-04-06 DIAGNOSIS — Z955 Presence of coronary angioplasty implant and graft: Secondary | ICD-10-CM

## 2018-04-06 DIAGNOSIS — M109 Gout, unspecified: Secondary | ICD-10-CM | POA: Insufficient documentation

## 2018-04-06 DIAGNOSIS — M791 Myalgia, unspecified site: Secondary | ICD-10-CM | POA: Insufficient documentation

## 2018-04-06 DIAGNOSIS — H409 Unspecified glaucoma: Secondary | ICD-10-CM | POA: Diagnosis not present

## 2018-04-06 DIAGNOSIS — Z7902 Long term (current) use of antithrombotics/antiplatelets: Secondary | ICD-10-CM | POA: Diagnosis not present

## 2018-04-06 DIAGNOSIS — I25118 Atherosclerotic heart disease of native coronary artery with other forms of angina pectoris: Secondary | ICD-10-CM | POA: Insufficient documentation

## 2018-04-06 DIAGNOSIS — I209 Angina pectoris, unspecified: Secondary | ICD-10-CM | POA: Diagnosis present

## 2018-04-06 DIAGNOSIS — E785 Hyperlipidemia, unspecified: Secondary | ICD-10-CM | POA: Insufficient documentation

## 2018-04-06 DIAGNOSIS — R9431 Abnormal electrocardiogram [ECG] [EKG]: Secondary | ICD-10-CM | POA: Diagnosis present

## 2018-04-06 DIAGNOSIS — I10 Essential (primary) hypertension: Secondary | ICD-10-CM | POA: Insufficient documentation

## 2018-04-06 DIAGNOSIS — E119 Type 2 diabetes mellitus without complications: Secondary | ICD-10-CM | POA: Insufficient documentation

## 2018-04-06 DIAGNOSIS — I2510 Atherosclerotic heart disease of native coronary artery without angina pectoris: Secondary | ICD-10-CM | POA: Diagnosis present

## 2018-04-06 HISTORY — PX: LEFT HEART CATH AND CORONARY ANGIOGRAPHY: CATH118249

## 2018-04-06 LAB — GLUCOSE, CAPILLARY: Glucose-Capillary: 93 mg/dL (ref 70–99)

## 2018-04-06 SURGERY — LEFT HEART CATH AND CORONARY ANGIOGRAPHY
Anesthesia: LOCAL

## 2018-04-06 MED ORDER — SODIUM CHLORIDE 0.9 % WEIGHT BASED INFUSION
3.0000 mL/kg/h | INTRAVENOUS | Status: DC
  Administered 2018-04-06: 3 mL/kg/h via INTRAVENOUS

## 2018-04-06 MED ORDER — SODIUM CHLORIDE 0.9 % IV SOLN: 250.0000 mL | INTRAVENOUS | Status: DC | PRN

## 2018-04-06 MED ORDER — FENTANYL CITRATE (PF) 100 MCG/2ML IJ SOLN
INTRAMUSCULAR | Status: DC | PRN
  Administered 2018-04-06 (×2): 25 ug via INTRAVENOUS

## 2018-04-06 MED ORDER — HEPARIN SODIUM (PORCINE) 1000 UNIT/ML IJ SOLN
INTRAMUSCULAR | Status: DC | PRN
  Administered 2018-04-06: 4500 [IU] via INTRAVENOUS

## 2018-04-06 MED ORDER — ACETAMINOPHEN 325 MG PO TABS: 650.0000 mg | ORAL_TABLET | ORAL | Status: DC | PRN

## 2018-04-06 MED ORDER — ASPIRIN 81 MG PO CHEW: 81.0000 mg | CHEWABLE_TABLET | ORAL | Status: DC

## 2018-04-06 MED ORDER — SODIUM CHLORIDE 0.9 % WEIGHT BASED INFUSION
1.0000 mL/kg/h | INTRAVENOUS | Status: DC
  Administered 2018-04-06: 1 mL/kg/h via INTRAVENOUS

## 2018-04-06 MED ORDER — SODIUM CHLORIDE 0.9 % IV SOLN: INTRAVENOUS | Status: DC

## 2018-04-06 MED ORDER — MIDAZOLAM HCL 2 MG/2ML IJ SOLN
INTRAMUSCULAR | Status: AC
  Filled 2018-04-06: qty 2

## 2018-04-06 MED ORDER — VERAPAMIL HCL 2.5 MG/ML IV SOLN
INTRAVENOUS | Status: DC | PRN
  Administered 2018-04-06: 10 mL via INTRA_ARTERIAL

## 2018-04-06 MED ORDER — SODIUM CHLORIDE 0.9 % IV SOLN
INTRAVENOUS | Status: AC | PRN
  Administered 2018-04-06: 400 mL via INTRAVENOUS

## 2018-04-06 MED ORDER — MIDAZOLAM HCL 2 MG/2ML IJ SOLN
INTRAMUSCULAR | Status: DC | PRN
  Administered 2018-04-06: 0.5 mg via INTRAVENOUS
  Administered 2018-04-06: 1 mg via INTRAVENOUS

## 2018-04-06 MED ORDER — OXYCODONE HCL 5 MG PO TABS: 5.0000 mg | ORAL_TABLET | ORAL | Status: DC | PRN

## 2018-04-06 MED ORDER — HEPARIN SODIUM (PORCINE) 1000 UNIT/ML IJ SOLN
INTRAMUSCULAR | Status: AC
  Filled 2018-04-06: qty 1

## 2018-04-06 MED ORDER — NITROGLYCERIN 1 MG/10 ML FOR IR/CATH LAB
INTRA_ARTERIAL | Status: AC
  Filled 2018-04-06: qty 10

## 2018-04-06 MED ORDER — FENTANYL CITRATE (PF) 100 MCG/2ML IJ SOLN
INTRAMUSCULAR | Status: AC
  Filled 2018-04-06: qty 2

## 2018-04-06 MED ORDER — HEPARIN (PORCINE) IN NACL 1000-0.9 UT/500ML-% IV SOLN
INTRAVENOUS | Status: DC | PRN
  Administered 2018-04-06 (×2): 500 mL

## 2018-04-06 MED ORDER — NITROGLYCERIN 1 MG/10 ML FOR IR/CATH LAB
INTRA_ARTERIAL | Status: DC | PRN
  Administered 2018-04-06: 100 ug via INTRACORONARY

## 2018-04-06 MED ORDER — ONDANSETRON HCL 4 MG/2ML IJ SOLN: 4.0000 mg | Freq: Four times a day (QID) | INTRAMUSCULAR | Status: DC | PRN

## 2018-04-06 MED ORDER — LIDOCAINE HCL (PF) 1 % IJ SOLN
INTRAMUSCULAR | Status: DC | PRN
  Administered 2018-04-06: 2 mL

## 2018-04-06 MED ORDER — SODIUM CHLORIDE 0.9% FLUSH: 3.0000 mL | INTRAVENOUS | Status: DC | PRN

## 2018-04-06 MED ORDER — IOHEXOL 350 MG/ML SOLN
INTRAVENOUS | Status: DC | PRN
  Administered 2018-04-06: 60 mL via INTRA_ARTERIAL

## 2018-04-06 MED ORDER — VERAPAMIL HCL 2.5 MG/ML IV SOLN
INTRAVENOUS | Status: AC
  Filled 2018-04-06: qty 2

## 2018-04-06 MED ORDER — SODIUM CHLORIDE 0.9% FLUSH: 3.0000 mL | Freq: Two times a day (BID) | INTRAVENOUS | Status: DC

## 2018-04-06 MED ORDER — HEPARIN (PORCINE) IN NACL 1000-0.9 UT/500ML-% IV SOLN
INTRAVENOUS | Status: AC
  Filled 2018-04-06: qty 1000

## 2018-04-06 MED ORDER — LIDOCAINE HCL (PF) 1 % IJ SOLN
INTRAMUSCULAR | Status: AC
  Filled 2018-04-06: qty 30

## 2018-04-06 SURGICAL SUPPLY — 10 items
CATH 5FR JL3.5 JR4 ANG PIG MP (CATHETERS) ×1 IMPLANT
DEVICE RAD COMP TR BAND LRG (VASCULAR PRODUCTS) ×1 IMPLANT
GLIDESHEATH SLEND A-KIT 6F 22G (SHEATH) ×1 IMPLANT
GUIDEWIRE INQWIRE 1.5J.035X260 (WIRE) IMPLANT
INQWIRE 1.5J .035X260CM (WIRE) ×2
KIT HEART LEFT (KITS) ×2 IMPLANT
PACK CARDIAC CATHETERIZATION (CUSTOM PROCEDURE TRAY) ×2 IMPLANT
SHEATH PROBE COVER 6X72 (BAG) ×1 IMPLANT
TRANSDUCER W/STOPCOCK (MISCELLANEOUS) ×2 IMPLANT
TUBING CIL FLEX 10 FLL-RA (TUBING) ×2 IMPLANT

## 2018-04-06 NOTE — Discharge Instructions (Signed)
Drink plenty of fluids over next 48 hours and keep right wrist elevated at heart level for 24 hours  Radial Site Care  This sheet gives you information about how to care for yourself after your procedure. Your health care provider may also give you more specific instructions. If you have problems or questions, contact your health care provider. What can I expect after the procedure? After the procedure, it is common to have:  Bruising and tenderness at the catheter insertion area. Follow these instructions at home: Medicines  Take over-the-counter and prescription medicines only as told by your health care provider. Insertion site care  Follow instructions from your health care provider about how to take care of your insertion site. Make sure you: ? Wash your hands with soap and water before you change your bandage (dressing). If soap and water are not available, use hand sanitizer. ? Change your dressing as told by your health care provider. ? Leave stitches (sutures), skin glue, or adhesive strips in place. These skin closures may need to stay in place for 2 weeks or longer. If adhesive strip edges start to loosen and curl up, you may trim the loose edges. Do not remove adhesive strips completely unless your health care provider tells you to do that.  Check your insertion site every day for signs of infection. Check for: ? Redness, swelling, or pain. ? Fluid or blood. ? Pus or a bad smell. ? Warmth.  Do not take baths, swim, or use a hot tub until your health care provider approves.  You may shower 24-48 hours after the procedure, or as directed by your health care provider. ? Remove the dressing and gently wash the site with plain soap and water. ? Pat the area dry with a clean towel. ? Do not rub the site. That could cause bleeding.  Do not apply powder or lotion to the site. Activity   For 24 hours after the procedure, or as directed by your health care provider: ? Do not  flex or bend the affected arm. ? Do not push or pull heavy objects with the affected arm. ? Do not drive yourself home from the hospital or clinic. You may drive 24 hours after the procedure unless your health care provider tells you not to. ? Do not operate machinery or power tools.  Do not lift anything that is heavier than 10 lb (4.5 kg), or the limit that you are told, until your health care provider says that it is safe.  Ask your health care provider when it is okay to: ? Return to work or school. ? Resume usual physical activities or sports. ? Resume sexual activity. General instructions  If the catheter site starts to bleed, raise your arm and put firm pressure on the site. If the bleeding does not stop, get help right away. This is a medical emergency.  If you went home on the same day as your procedure, a responsible adult should be with you for the first 24 hours after you arrive home.  Keep all follow-up visits as told by your health care provider. This is important. Contact a health care provider if:  You have a fever.  You have redness, swelling, or yellow drainage around your insertion site. Get help right away if:  You have unusual pain at the radial site.  The catheter insertion area swells very fast.  The insertion area is bleeding, and the bleeding does not stop when you hold steady pressure on  the area.  Your arm or hand becomes pale, cool, tingly, or numb. These symptoms may represent a serious problem that is an emergency. Do not wait to see if the symptoms will go away. Get medical help right away. Call your local emergency services (911 in the [place] Do not drive yourself to the hospital. Summary  After the procedure, it is common to have bruising and tenderness at the site.  Follow instructions from your health care provider about how to take care of your radial site wound. Check the wound every day for signs of infection.  Do not lift anything that is  heavier than 10 lb (4.5 kg), or the limit that you are told, until your health care provider says that it is safe. This information is not intended to replace advice given to you by your health care provider. Make sure you discuss any questions you have with your health care provider. Document Released: 03/26/2010 Document Revised: 03/29/2017 Document Reviewed: 03/29/2017 Elsevier Interactive Patient Education  2019 [employer].

## 2018-04-06 NOTE — Interval H&P Note (Signed)
Cath Lab Visit (complete for each Cath Lab visit)  Clinical Evaluation Leading to the Procedure:   ACS: No.  Non-ACS:    Anginal Classification: CCS [name]  Anti-ischemic medical therapy: Minimal Therapy (1 class of medications)  Non-Invasive Test Results: No non-invasive testing performed  Prior CABG: No previous CABG      History and Physical Interval Note:  04/06/2018 10:06 AM  [name] [name].  has presented today for surgery, with the diagnosis of ua  The various methods of treatment have been discussed with the patient and family. After consideration of risks, benefits and other options for treatment, the patient has consented to  Procedure(s): LEFT HEART CATH AND CORONARY ANGIOGRAPHY (N/A) as a surgical intervention .  The patient's history has been reviewed, patient examined, no change in status, stable for surgery.  I have reviewed the patient's chart and labs.  Questions were answered to the patient's satisfaction.     [name] [name]

## 2018-04-09 ENCOUNTER — Telehealth (HOSPITAL_COMMUNITY): Payer: Self-pay | Admitting: *Deleted

## 2018-04-09 ENCOUNTER — Ambulatory Visit (HOSPITAL_COMMUNITY): Payer: Medicare Other

## 2018-04-09 ENCOUNTER — Encounter (HOSPITAL_COMMUNITY): Payer: Medicare Other

## 2018-04-10 ENCOUNTER — Other Ambulatory Visit: Payer: Self-pay | Admitting: Family Medicine

## 2018-04-10 ENCOUNTER — Encounter: Payer: Self-pay | Admitting: Family Medicine

## 2018-04-10 ENCOUNTER — Ambulatory Visit (INDEPENDENT_AMBULATORY_CARE_PROVIDER_SITE_OTHER): Payer: Medicare Other | Admitting: Family Medicine

## 2018-04-10 VITALS — BP 140/82 | HR 56 | Temp 98.5°F | Wt 195.1 lb

## 2018-04-10 DIAGNOSIS — I209 Angina pectoris, unspecified: Secondary | ICD-10-CM | POA: Diagnosis not present

## 2018-04-10 DIAGNOSIS — I2510 Atherosclerotic heart disease of native coronary artery without angina pectoris: Secondary | ICD-10-CM | POA: Diagnosis not present

## 2018-04-10 DIAGNOSIS — R079 Chest pain, unspecified: Secondary | ICD-10-CM | POA: Diagnosis not present

## 2018-04-10 DIAGNOSIS — I10 Essential (primary) hypertension: Secondary | ICD-10-CM | POA: Diagnosis not present

## 2018-04-10 MED ORDER — OMEPRAZOLE 40 MG PO CPDR: 40.0000 mg | DELAYED_RELEASE_CAPSULE | Freq: Every day | ORAL | 0 refills | Status: DC

## 2018-04-10 NOTE — Progress Notes (Signed)
   Subjective:    Patient ID: [name] [name]., male    DOB: November 05, 1945, 73 y.o.   MRN: [id number]  HPI Here asking for advice about chest pain. He describes almost daily chest pains that sometimes are related to exertion (such as when he swims) and sometimes are not (such as quietly reading in a chair). These are pressure like in quality, and they can last anywhere from minutes to hours at a time. He sometimes feels SOB with these spells. No nausea or sweats. He denies any heartburn or trouble swallowing. He recently had a cardiac cath which showed a patent stent and several lesions that were only 30% blocked. He had a stress test in December which was normal. He had PFTs that were normal. He was tried on Imdur as well, but this has not helped.    Review of Systems  Constitutional: Negative.   Respiratory: Positive for shortness of breath. Negative for cough and wheezing.   Cardiovascular: Positive for chest pain. Negative for palpitations and leg swelling.  Gastrointestinal: Negative.   Neurological: Negative.        Objective:   Physical Exam Constitutional:      Appearance: Normal appearance.  Cardiovascular:     Rate and Rhythm: Normal rate and regular rhythm.     Pulses: Normal pulses.     Heart sounds: Normal heart sounds.  Pulmonary:     Effort: Pulmonary effort is normal.     Breath sounds: Normal breath sounds.  Chest:     Chest wall: No tenderness.  Abdominal:     General: Abdomen is flat. Bowel sounds are normal. There is no distension.     Palpations: Abdomen is soft. There is no mass.     Tenderness: There is no abdominal tenderness. There is no guarding or rebound.     Hernia: No hernia is present.  Neurological:     Mental Status: He is alert.           Assessment & Plan:  He has frequent chest pains that in some ways sound anginal but in other ways do not. These could have a GI source, and either esophageal spasms or a hiatal hernia are possible  etiologies. He wil try Omeprazole 40 mg daily for the next week to see if this helps. If not, he will be seeing [name] next week and they could discuss the possibility of trying Ranexa.  Alysia Penna, MD

## 2018-04-10 NOTE — Progress Notes (Signed)
Cardiology Office Note   Date:  04/17/2018   ID:  [name] [name]., DOB [birth year], MRN [id number]  PCP:  [name], MD  Cardiologist:  [name]  No chief complaint on file.    History of Present Illness:  73 y.o. HLD, DM, HTN and CAD. Stent to circumflex 01/17/18 good result Single vessel disease had some recurrent pain post intervention and placed on imdur. F/U myovue 03/01/18 non ischemia low risk EF 60%.  Had some myalgias on higher dose crestor and dose decrease by NP on 02/22/18 Swims 3-4 x/ week  He continues to have SSCP and exertional dyspnea. Not all of his symptoms are with activity. Imdur has helped. Started cardiac rehab and really enjoys it Due to persistent symptoms repeat cath done 04/06/18 with widely patent circumflex stent and medical Rx recommended for remainder of his disease see report below Started on omeprazole by Dr Sarajane Jews 04/10/18  Dr Thompson Caul note indicated Ranexa possibly for distal circumflex disease if symptoms continue   Since d/c some dyspnea while swimming but only on left side  Dr Sarajane Jews started prilosec which has helped some of his symptoms     Past Medical History:  Diagnosis Date  . ABNORMAL EKG 10/07/2008  . ADVEF, DRUG/MED/BIOL SUBST, OTHER DRUG NOS 09/26/2006  . Allergy    seasonal  . Cataract    left eye-surgery 07-2015  . DEGENERATIVE JOINT DISEASE, MODERATE 04/13/2007  . Diabetes mellitus    type 2, diet controlled   . DIVERTICULOSIS, COLON 04/13/2007  . ELEVATED PROSTATE SPECIFIC ANTIGEN 04/14/2007  . Glaucoma    sees [name]   . GOUT 05/16/2007  . HYPERLIPIDEMIA 09/26/2006  . HYPERTENSION 04/13/2007  . JOINT EFFUSION, KNEE 06/15/2009  . Melanoma [facility])    sees [name]   . PROSTATE CANCER 10/05/2009   sees [name]  . Renal insufficiency    RESOLVED PER PATIENT    Past Surgical History:  Procedure Laterality Date  . ACROMIOPLASTY Right 05-06-14   per [name]   . BUNIONECTOMY     bilateral  . CATARACT EXTRACTION Left    per Dr.  [name]   . COLONOSCOPY  07/10/2015   per [name], benign polyp, repeat in 10 yrs   . CORONARY STENT INTERVENTION N/A 01/17/2018   Procedure: CORONARY STENT INTERVENTION;  Surgeon: [name], MD;  Location: [place] [facility];  Service: Cardiovascular;  Laterality: N/A;  . CYSTECTOMY     benign from hand  . GLAUCOMA SURGERY Bilateral 2014   . INSERTION OF MESH N/A 01/10/2013   Procedure: INSERTION OF MESH;  Surgeon: Earnstine Regal, MD;  Location: [place];  Service: General;  Laterality: N/A;  . KNEE ARTHROSCOPY Right Jan. 2013   right knee, per [name]   . KNEE ARTHROSCOPY Left 01-14-15   per [name]   . LEFT HEART CATH AND CORONARY ANGIOGRAPHY N/A 01/17/2018   Procedure: LEFT HEART CATH AND CORONARY ANGIOGRAPHY;  Surgeon: [name], MD;  Location: [place] [facility];  Service: Cardiovascular;  Laterality: N/A;  . LEFT HEART CATH AND CORONARY ANGIOGRAPHY N/A 04/06/2018   Procedure: LEFT HEART CATH AND CORONARY ANGIOGRAPHY;  Surgeon: [name], MD;  Location: [place] [facility];  Service: Cardiovascular;  Laterality: N/A;  . MELANOMA EXCISION  2012   per [name]  . PROSTATECTOMY     per [name]  . TONSILLECTOMY    . UMBILICAL HERNIA REPAIR  11/01/2011  Procedure: HERNIA REPAIR UMBILICAL ADULT;  Surgeon: Earnstine Regal, MD;  Location: [place];  Service: General;  Laterality: N/A;  Repair umbilical hernia with mesh patch  . VASECTOMY    . VENTRAL HERNIA REPAIR  01/10/2013   with mesh     [name]  . VENTRAL HERNIA REPAIR N/A 01/10/2013   Procedure: HERNIA REPAIR VENTRAL ADULT ;  Surgeon: Earnstine Regal, MD;  Location: [place];  Service: General;  Laterality: N/A;     Current Outpatient Medications  Medication Sig Dispense Refill  . aspirin EC 81 MG tablet Take 81 mg by mouth every evening.     . bimatoprost (LUMIGAN) 0.01 % SOLN Place 1 drop into both eyes at bedtime.    . clopidogrel (PLAVIX) 75 MG tablet Take 1 tablet (75 mg total) by  mouth daily with breakfast. 90 tablet 8  . Coenzyme Q10 (COQ-10) 100 MG CAPS Take 100 mg by mouth every evening.     . dorzolamide-timolol (COSOPT) 22.3-6.8 MG/ML ophthalmic solution Place 1 drop into the right eye 2 (two) times daily.    . isosorbide mononitrate (IMDUR) 30 MG 24 hr tablet Take 1 tablet (30 mg total) by mouth daily. 90 tablet 3  . magnesium oxide (MAG-OX) 400 MG tablet Take 400 mg by mouth every evening.     . Multiple Vitamins-Minerals (MULTIVITAMIN WITH MINERALS) tablet Take 1 tablet by mouth 2 (two) times a week. Sundays and Wednesdays    . omeprazole (PRILOSEC) 40 MG capsule TAKE 1 CAPSULE BY MOUTH EVERY DAY 90 capsule 1  . rosuvastatin (CRESTOR) 20 MG tablet Take 20 mg by mouth every evening.     . fenofibrate 160 MG tablet Take 1 tablet (160 mg total) by mouth daily. (Patient taking differently: Take 160 mg by mouth every evening. ) 90 tablet 3  . lisinopril (PRINIVIL,ZESTRIL) 20 MG tablet Take 1 tablet (20 mg total) by mouth daily. 90 tablet 3  . nitroGLYCERIN (NITROSTAT) 0.4 MG SL tablet Place 1 tablet (0.4 mg total) under the tongue every 5 (five) minutes as needed. (Patient taking differently: Place 0.4 mg under the tongue every 5 (five) minutes as needed for chest pain. ) 25 tablet 3   No current facility-administered medications for this visit.     Allergies:   Patient has no known allergies.    Social History:  The patient  reports that he quit smoking about 46 years ago. He started smoking about 55 years ago. He has a 9.00 pack-year smoking history. He has never used smokeless tobacco. He reports that he does not drink alcohol or use drugs.   Family History:  The patient's family history includes Heart failure in his father; Stroke in his mother.    ROS: All other systems are reviewed and negative. Unless otherwise mentioned in H&P    PHYSICAL EXAM: VS:  BP 132/76   Pulse (!) 53   Ht 5\' 6"  (1.676 m)   Wt 194 lb (88 kg)   BMI 31.31 kg/m  , BMI Body mass  index is 31.31 kg/m. Affect appropriate Healthy:  appears stated age 46: normal Neck supple with no adenopathy JVP normal no bruits no thyromegaly Lungs clear with no wheezing and good diaphragmatic motion Heart:  S1/S2 no murmur, no rub, gallop or click PMI normal Abdomen: benighn, BS positve, no tenderness, no AAA no bruit.  No HSM or HJR Distal pulses intact with no bruits No edema Neuro non-focal Skin warm and dry No muscular  weakness   EKG:   02/22/18 SB rate 47 nonspecific ST changes   Recent Labs: 11/07/2017: ALT 17; TSH 3.11 03/29/2018: BUN 21; Creatinine, Ser 1.57; Hemoglobin 14.9; Platelets 243; Potassium 4.5; Sodium 141    Lipid Panel    Component Value Date/Time   CHOL 163 11/07/2017 1133   TRIG 165.0 (H) 11/07/2017 1133   HDL 43.10 11/07/2017 1133   CHOLHDL 4 11/07/2017 1133   VLDL 33.0 11/07/2017 1133   LDLCALC 87 11/07/2017 1133   LDLDIRECT 82.1 04/07/2011 0807      Wt Readings from Last 3 Encounters:  04/17/18 194 lb (88 kg)  04/10/18 195 lb 2 oz (88.5 kg)  04/06/18 190 lb (86.2 kg)      Other studies Reviewed: Cath 04/06/18  Conclusion    Widely patent mid circumflex drug-eluting stent.  The circumflex beyond the stent is unchanged from before with a 50 to 70% distal bifurcation stenosis in a [place] 110 configuration.  The circumflex is otherwise patent.  Left main is normal.  LAD is widely patent.  A branch of the first diagonal contains ostial 80% narrowing.  This tertiary branches are small.  RCA is widely patent with distal PDA and LV branch diffuse disease.  Normal LV function and LVEDP 10 mmHg.  RECOMMENDATIONS:   It is conceivable that angina is due to the very distal bifurcation disease in the circumflex.  It does not appear to be angiographically severe.  Stent implantation would include crossing over an equally large side branch.  Considering the stability, complexity of an interventional procedure (with risk of side branch  occlusion), and the far distal location, recommend continued conservative medical management possibly adding either Ranexa or beta-blocker therapy to the regimen.        ASSESSMENT AND PLAN:  1. CAD: 01/17/18 stent to the prox to mid Cx Single vessel disease with good result . He was been placed on isosorbide 15 mg but persistent symptoms F/U myovue done 03/01/18 low risk with no ischemia EF 60% continue medical Rx  Repeat Cath 04/06/18 with widely patent stent with medical Rx recommended for other disease   2. Myalgia Pain: Crestor dose decreased to 10 mg    3. Hypertension: Well controlled.  Continue current medications and low sodium Dash type diet.     4. HLD:  ? Myalgias from statin crestor dose recently decreased observe   5. GERD on prilosec may need EGD f/u primary   Current medicines are reviewed at length with the patient today.    Labs/ tests ordered today include: None     Jenkins Rouge

## 2018-04-11 ENCOUNTER — Ambulatory Visit (HOSPITAL_COMMUNITY): Payer: Medicare Other

## 2018-04-11 ENCOUNTER — Encounter (HOSPITAL_COMMUNITY): Payer: Medicare Other

## 2018-04-12 ENCOUNTER — Encounter (HOSPITAL_COMMUNITY): Payer: Self-pay | Admitting: *Deleted

## 2018-04-12 DIAGNOSIS — Z955 Presence of coronary angioplasty implant and graft: Secondary | ICD-10-CM

## 2018-04-12 NOTE — Progress Notes (Signed)
Cardiac Individual Treatment Plan  Patient Details  Name: [name] [name]. MRN: [id number] Date of Birth: [birth year] Referring Provider:     CARDIAC REHAB PHASE II ORIENTATION from 03/22/2018 in [place]  Referring Provider  [name]      Initial Encounter Date:    CARDIAC REHAB PHASE II ORIENTATION from 03/22/2018 in [place]  Date  03/22/18      Visit Diagnosis: Status post coronary artery stent placement  Patient's Home Medications on Admission:  Current Outpatient Medications:  .  aspirin EC 81 MG tablet, Take 81 mg by mouth every evening. , Disp: , Rfl:  .  bimatoprost (LUMIGAN) 0.01 % SOLN, Place 1 drop into both eyes at bedtime., Disp: , Rfl:  .  clopidogrel (PLAVIX) 75 MG tablet, Take 1 tablet (75 mg total) by mouth daily with breakfast., Disp: 90 tablet, Rfl: 8 .  Coenzyme Q10 (COQ-10) 100 MG CAPS, Take 100 mg by mouth every evening. , Disp: , Rfl:  .  dorzolamide-timolol (COSOPT) 22.3-6.8 MG/ML ophthalmic solution, Place 1 drop into the right eye 2 (two) times daily., Disp: , Rfl:  .  fenofibrate 160 MG tablet, Take 1 tablet (160 mg total) by mouth daily. (Patient taking differently: Take 160 mg by mouth every evening. ), Disp: 90 tablet, Rfl: 3 .  isosorbide mononitrate (IMDUR) 30 MG 24 hr tablet, Take 1 tablet (30 mg total) by mouth daily., Disp: 90 tablet, Rfl: 3 .  lisinopril (PRINIVIL,ZESTRIL) 20 MG tablet, Take 1 tablet (20 mg total) by mouth daily., Disp: 90 tablet, Rfl: 3 .  magnesium oxide (MAG-OX) 400 MG tablet, Take 400 mg by mouth every evening. , Disp: , Rfl:  .  Multiple Vitamins-Minerals (MULTIVITAMIN WITH MINERALS) tablet, Take 1 tablet by mouth 2 (two) times a week. Sundays and Wednesdays, Disp: , Rfl:  .  nitroGLYCERIN (NITROSTAT) 0.4 MG SL tablet, Place 1 tablet (0.4 mg total) under the tongue every 5 (five) minutes as needed. (Patient taking differently: Place 0.4 mg under the tongue  every 5 (five) minutes as needed for chest pain. ), Disp: 25 tablet, Rfl: 3 .  omeprazole (PRILOSEC) 40 MG capsule, TAKE 1 CAPSULE BY MOUTH EVERY DAY, Disp: 90 capsule, Rfl: 1 .  rosuvastatin (CRESTOR) 20 MG tablet, Take 20 mg by mouth every evening. , Disp: , Rfl:   Past Medical History: Past Medical History:  Diagnosis Date  . ABNORMAL EKG 10/07/2008  . ADVEF, DRUG/MED/BIOL SUBST, OTHER DRUG NOS 09/26/2006  . Allergy    seasonal  . Cataract    left eye-surgery 07-2015  . DEGENERATIVE JOINT DISEASE, MODERATE 04/13/2007  . Diabetes mellitus    type 2, diet controlled   . DIVERTICULOSIS, COLON 04/13/2007  . ELEVATED PROSTATE SPECIFIC ANTIGEN 04/14/2007  . Glaucoma    sees [name]   . GOUT 05/16/2007  . HYPERLIPIDEMIA 09/26/2006  . HYPERTENSION 04/13/2007  . JOINT EFFUSION, KNEE 06/15/2009  . Melanoma [facility])    sees [name]   . PROSTATE CANCER 10/05/2009   sees [name]  . Renal insufficiency    RESOLVED PER PATIENT    Tobacco Use: Social History   Tobacco Use  Smoking Status Former Smoker  . Packs/day: 1.00  . Years: 9.00  . Pack years: 9.00  . Start date: 03/08/1963  . Last attempt to quit: 03/07/1972  . Years since quitting: 46.1  Smokeless Tobacco Never Used  Tobacco Comment   States AAA completed  Labs: Recent Review Flowsheet Data    Labs for ITP Cardiac and Pulmonary Rehab Latest Ref Rng & Units 11/06/2015 05/05/2016 11/08/2016 05/09/2017 11/07/2017   Cholestrol 0 - 200 mg/dL 187 127 135 132 163   LDLCALC 0 - 99 mg/dL 115(H) 64 67 71 87   LDLDIRECT mg/dL - - - - -   HDL >39.00 mg/dL 47.80 41.70 50.30 45.00 43.10   Trlycerides 0.0 - 149.0 mg/dL 124.0 107.0 88.0 80.0 165.0(H)   Hemoglobin A1c 4.6 - 6.5 % 6.2 6.0 5.7 5.9 6.0      Capillary Blood Glucose: Lab Results  Component Value Date   GLUCAP 93 04/06/2018   GLUCAP 91 03/28/2018   GLUCAP 140 (H) 03/28/2018   GLUCAP 94 11/01/2011     Exercise Target Goals: Exercise Program Goal: Individual exercise  prescription set using results from initial 6 min walk test and THRR while considering  patient's activity barriers and safety.   Exercise Prescription Goal: Initial exercise prescription builds to 30-45 minutes a day of aerobic activity, 2-3 days per week.  Home exercise guidelines will be given to patient during program as part of exercise prescription that the participant will acknowledge.  Activity Barriers & Risk Stratification: Activity Barriers & Cardiac Risk Stratification - 03/22/18 1113      Activity Barriers & Cardiac Risk Stratification   Activity Barriers  Arthritis;Other (comment);Joint Problems    Comments  Torn rotator cuff (right)     Cardiac Risk Stratification  Moderate       6 Minute Walk: 6 Minute Walk    Row Name 03/22/18 1112         6 Minute Walk   Phase  Initial     Distance  1691 feet     Walk Time  6 minutes     # of Rest Breaks  0     MPH  3.2     METS  3.4     RPE  11     VO2 Peak  11.85     Symptoms  No     Resting HR  66 bpm     Resting BP  130/72     Resting Oxygen Saturation   97 %     Exercise Oxygen Saturation  during 6 min walk  97 %     Max Ex. HR  91 bpm     Max Ex. BP  142/82     2 Minute Post BP  122/78        Oxygen Initial Assessment:   Oxygen Re-Evaluation:   Oxygen Discharge (Final Oxygen Re-Evaluation):   Initial Exercise Prescription: Initial Exercise Prescription - 03/22/18 1100      Date of Initial Exercise RX and Referring Provider   Date  03/22/18    Referring Provider  [name]    Expected Discharge Date  06/29/18      Treadmill   MPH  3    Grade  0    Minutes  10      Recumbant Bike   Level  2    Watts  35    Minutes  10    METs  3.3      NuStep   Level  3    SPM  85    Minutes  10    METs  3      Prescription Details   Frequency (times per week)  3    Duration  Progress to 30 minutes of continuous aerobic  without signs/symptoms of physical distress      Intensity   THRR 40-80% of Max  Heartrate  60-120    Ratings of Perceived Exertion  11-13      Progression   Progression  Continue to progress workloads to maintain intensity without signs/symptoms of physical distress.      Resistance Training   Training Prescription  Yes    Weight  4 lbs.     Reps  10-15       Perform Capillary Blood Glucose checks as needed.  Exercise Prescription Changes: Exercise Prescription Changes    Row Name 03/28/18 1456             Response to Exercise   Blood Pressure (Admit)  120/80       Blood Pressure (Exercise)  172/60       Blood Pressure (Exit)  118/70       Heart Rate (Admit)  96 bpm       Heart Rate (Exercise)  121 bpm       Heart Rate (Exit)  89 bpm       Rating of Perceived Exertion (Exercise)  12       Symptoms  Mild chest pressure/SOB on TM resolved w/ dec WL       Duration  Progress to 30 minutes of  aerobic without signs/symptoms of physical distress       Intensity  THRR unchanged         Progression   Progression  Continue to progress workloads to maintain intensity without signs/symptoms of physical distress.       Average METs  3         Resistance Training   Training Prescription  No Relaxation day, no weights         Interval Training   Interval Training  No         Treadmill   MPH  3.3 Dec Wl back to 3.0/0 when pt c/o chest pressure/SOB       Grade  0       Minutes  10       METs  3.53         Bike   Level  2 [employer] bike       Minutes  10       METs  3         Recumbant Bike   Level  -       Watts  -       Minutes  -       METs  -         NuStep   Level  3       SPM  85       Minutes  10       METs  2.5         Home Exercise Plan   Plans to continue exercise at  [employer] (comment) Swimming. 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       Frequency  Add 3 additional days to program exercise sessions.          Exercise Comments: Exercise Comments    Row Name 03/28/18 1551 04/03/18 1448         Exercise Comments  Patient  tolerated 1st session of exercise fairly well with some chest pressure/SOB while walking on treadmill. Pt will discuss symptoms with [name] at follow-up appointment tomorrow.  Patient is scheduled for cardiac cath on 04/06/2018, will hold  exercise pending cath results.         Exercise Goals and Review: Exercise Goals    Row Name 03/22/18 1114             Exercise Goals   Increase Physical Activity  Yes       Intervention  Provide advice, education, support and counseling about physical activity/exercise needs.;Develop an individualized exercise prescription for aerobic and resistive training based on initial evaluation findings, risk stratification, comorbidities and participant's personal goals.       Expected Outcomes  Short Term: Attend rehab on a regular basis to increase amount of physical activity.;Long Term: Add in home exercise to make exercise part of routine and to increase amount of physical activity.;Long Term: Exercising regularly at least 3-5 days a week.       Increase Strength and Stamina  Yes       Intervention  Provide advice, education, support and counseling about physical activity/exercise needs.;Develop an individualized exercise prescription for aerobic and resistive training based on initial evaluation findings, risk stratification, comorbidities and participant's personal goals.       Expected Outcomes  Short Term: Increase workloads from initial exercise prescription for resistance, speed, and METs.       Able to understand and use rate of perceived exertion (RPE) scale  Yes       Intervention  Provide education and explanation on how to use RPE scale       Expected Outcomes  Short Term: Able to use RPE daily in rehab to express subjective intensity level;Long Term:  Able to use RPE to guide intensity level when exercising independently       Knowledge and understanding of Target Heart Rate Range (THRR)  Yes       Intervention  Provide education and explanation of  THRR including how the numbers were predicted and where they are located for reference       Expected Outcomes  Short Term: Able to state/look up THRR;Long Term: Able to use THRR to govern intensity when exercising independently;Short Term: Able to use daily as guideline for intensity in rehab       Able to check pulse independently  Yes       Intervention  Provide education and demonstration on how to check pulse in carotid and radial arteries.;Review the importance of being able to check your own pulse for safety during independent exercise       Expected Outcomes  Short Term: Able to explain why pulse checking is important during independent exercise;Long Term: Able to check pulse independently and accurately       Understanding of Exercise Prescription  Yes       Intervention  Provide education, explanation, and written materials on patient's individual exercise prescription       Expected Outcomes  Short Term: Able to explain program exercise prescription;Long Term: Able to explain home exercise prescription to exercise independently          Exercise Goals Re-Evaluation : Exercise Goals Re-Evaluation    Row Name 03/28/18 1551             Exercise Goal Re-Evaluation   Exercise Goals Review  Able to understand and use rate of perceived exertion (RPE) scale;Increase Physical Activity       Comments  Patient able to understand and use RPE scale appropriately. Pt is swimming or walking on treadmill at home 30 minutes, 3 days/week.       Expected Outcomes  Progress workloads  as tolerated to exercise below anginal threshold and onset of SOB.          Discharge Exercise Prescription (Final Exercise Prescription Changes): Exercise Prescription Changes - 03/28/18 1456      Response to Exercise   Blood Pressure (Admit)  120/80    Blood Pressure (Exercise)  172/60    Blood Pressure (Exit)  118/70    Heart Rate (Admit)  96 bpm    Heart Rate (Exercise)  121 bpm    Heart Rate (Exit)  89 bpm     Rating of Perceived Exertion (Exercise)  12    Symptoms  Mild chest pressure/SOB on TM resolved w/ dec WL    Duration  Progress to 30 minutes of  aerobic without signs/symptoms of physical distress    Intensity  THRR unchanged      Progression   Progression  Continue to progress workloads to maintain intensity without signs/symptoms of physical distress.    Average METs  3      Resistance Training   Training Prescription  No   Relaxation day, no weights     Interval Training   Interval Training  No      Treadmill   MPH  3.3   Dec Wl back to 3.0/0 when pt c/o chest pressure/SOB   Grade  0    Minutes  10    METs  3.53      Bike   Level  2   SciFit bike   Minutes  10    METs  3      Recumbant Bike   Level  --    Watts  --    Minutes  --    METs  --      NuStep   Level  3    SPM  85    Minutes  10    METs  2.5      Home Exercise Plan   Plans to continue exercise at  [employer] (comment)   Swimming. 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   Frequency  Add 3 additional days to program exercise sessions.       Nutrition:  Target Goals: Understanding of nutrition guidelines, daily intake of sodium 1500mg , cholesterol 200mg , calories 30% from fat and 7% or less from saturated fats, daily to have 5 or more servings of fruits and vegetables.  Biometrics: Pre Biometrics - 03/22/18 1115      Pre Biometrics   Height  5' 6.5" (1.689 m)    Weight  192 lb 0.3 oz (87.1 kg)    Waist Circumference  41 inches    Hip Circumference  41 inches    Waist to Hip Ratio  1 %    BMI (Calculated)  30.53    Triceps Skinfold  18 mm    % Body Fat  30.1 %    Grip Strength  45 kg    Flexibility  14 in    Single Leg Stand  30 seconds        Nutrition Therapy Plan and Nutrition Goals: Nutrition Therapy & Goals - 03/22/18 1154      Nutrition Therapy   Diet  carb modified, heart healthy      Personal Nutrition Goals   Nutrition Goal  Pt to identify and limit food sources of  saturated fat, trans fat, refined carbohydrates and sodium    Personal Goal #2  Pt to identify food quantities necessary to achieve weight loss of 6-24 lbs. at graduation  from cardiac rehab.    Personal Goal #3  Pt able to name foods that affect blood glucose.      Intervention Plan   Intervention  Prescribe, educate and counsel regarding individualized specific dietary modifications aiming towards targeted core components such as weight, hypertension, lipid management, diabetes, heart failure and other comorbidities.    Expected Outcomes  Short Term Goal: Understand basic principles of dietary content, such as calories, fat, sodium, cholesterol and nutrients.;Long Term Goal: Adherence to prescribed nutrition plan.       Nutrition Assessments: Nutrition Assessments - 03/22/18 1208      MEDFICTS Scores   Pre Score  15       Nutrition Goals Re-Evaluation: Nutrition Goals Re-Evaluation    [place] Name 03/22/18 1154             Goals   Current Weight  192 lb 0.3 oz (87.1 kg)          Nutrition Goals Re-Evaluation: Nutrition Goals Re-Evaluation    Row Name 03/22/18 1154             Goals   Current Weight  192 lb 0.3 oz (87.1 kg)          Nutrition Goals Discharge (Final Nutrition Goals Re-Evaluation): Nutrition Goals Re-Evaluation - 03/22/18 1154      Goals   Current Weight  192 lb 0.3 oz (87.1 kg)       Psychosocial: Target Goals: Acknowledge presence or absence of significant depression and/or stress, maximize coping skills, provide positive support system. Participant is able to verbalize types and ability to use techniques and skills needed for reducing stress and depression.  Initial Review & Psychosocial Screening: Initial Psych Review & Screening - 03/22/18 1202      Initial Review   Current issues with  None Identified      Family Dynamics   Good Support System?  Yes   Pt lists his spouse and family as sources of support.      Barriers   Psychosocial  barriers to participate in program  There are no identifiable barriers or psychosocial needs.      Screening Interventions   Interventions  Encouraged to exercise       Quality of Life Scores: Quality of Life - 03/22/18 1115      Quality of Life   Select  Quality of Life      Quality of Life Scores   Health/Function Pre  28.3 %    Socioeconomic Pre  26.43 %    Psych/Spiritual Pre  30 %    Family Pre  29.5 %    GLOBAL Pre  28.44 %      Scores of 19 and below usually indicate a poorer quality of life in these areas.  A difference of  2-3 points is a clinically meaningful difference.  A difference of 2-3 points in the total score of the Quality of Life Index has been associated with significant improvement in overall quality of life, self-image, physical symptoms, and general health in studies assessing change in quality of life.  PHQ-9: Recent Review Flowsheet Data    Depression screen [facility] 2/9 03/28/2018 05/09/2017 05/09/2017 05/05/2016 05/05/2016   Decreased Interest 0 0 0 0 0   Down, Depressed, Hopeless 0 0 0 0 0   PHQ - 2 Score 0 0 0 0 0     Interpretation of Total Score  Total Score Depression Severity:  1-4 = Minimal depression, 5-9 = Mild depression, 10-14 =  Moderate depression, 15-19 = Moderately severe depression, 20-27 = Severe depression   Psychosocial Evaluation and Intervention:   Psychosocial Re-Evaluation: Psychosocial Re-Evaluation    [place] Name 04/12/18 1534             Psychosocial Re-Evaluation   Current issues with  Current Stress Concerns       Interventions  Stress management education;Encouraged to attend Cardiac Rehabilitation for the exercise       Continue Psychosocial Services   No Follow up required         Initial Review   Source of Stress Concerns  Chronic Illness          Psychosocial Discharge (Final Psychosocial Re-Evaluation): Psychosocial Re-Evaluation - 04/12/18 1534      Psychosocial Re-Evaluation   Current issues with  Current Stress  Concerns    Interventions  Stress management education;Encouraged to attend Cardiac Rehabilitation for the exercise    Continue Psychosocial Services   No Follow up required      Initial Review   Source of Stress Concerns  Chronic Illness       Vocational Rehabilitation: Provide vocational rehab assistance to qualifying candidates.   Vocational Rehab Evaluation & Intervention: Vocational Rehab - 03/22/18 1202      Initial Vocational Rehab Evaluation & Intervention   Assessment shows need for Vocational Rehabilitation  No       Education: Education Goals: Education classes will be provided on a weekly basis, covering required topics. Participant will state understanding/return demonstration of topics presented.  Learning Barriers/Preferences: Learning Barriers/Preferences - 03/22/18 1118      Learning Barriers/Preferences   Learning Barriers  Sight;Hearing    Learning Preferences  Pictoral;Video;Skilled Demonstration       Education Topics: Count Your Pulse:  -Group instruction provided by verbal instruction, demonstration, patient participation and written materials to support subject.  Instructors address importance of being able to find your pulse and how to count your pulse when at home without a heart monitor.  Patients get hands on experience counting their pulse with staff help and individually.   Heart Attack, Angina, and Risk Factor Modification:  -Group instruction provided by verbal instruction, video, and written materials to support subject.  Instructors address signs and symptoms of angina and heart attacks.    Also discuss risk factors for heart disease and how to make changes to improve heart health risk factors.   Functional Fitness:  -Group instruction provided by verbal instruction, demonstration, patient participation, and written materials to support subject.  Instructors address safety measures for doing things around the house.  Discuss how to get up  and down off the floor, how to pick things up properly, how to safely get out of a chair without assistance, and balance training.   Meditation and Mindfulness:  -Group instruction provided by verbal instruction, patient participation, and written materials to support subject.  Instructor addresses importance of mindfulness and meditation practice to help reduce stress and improve awareness.  Instructor also leads participants through a meditation exercise.    Stretching for Flexibility and Mobility:  -Group instruction provided by verbal instruction, patient participation, and written materials to support subject.  Instructors lead participants through series of stretches that are designed to increase flexibility thus improving mobility.  These stretches are additional exercise for major muscle groups that are typically performed during regular warm up and cool down.   Hands Only CPR:  -Group verbal, video, and participation provides a basic overview of AHA guidelines for community CPR. Role-play  of emergencies allow participants the opportunity to practice calling for help and chest compression technique with discussion of AED use.   Hypertension: -Group verbal and written instruction that provides a basic overview of hypertension including the most recent diagnostic guidelines, risk factor reduction with self-care instructions and medication management.    Nutrition I class: Heart Healthy Eating:  -Group instruction provided by PowerPoint slides, verbal discussion, and written materials to support subject matter. The instructor gives an explanation and review of the Therapeutic Lifestyle Changes diet recommendations, which includes a discussion on lipid goals, dietary fat, sodium, fiber, plant stanol/sterol esters, sugar, and the components of a well-balanced, healthy diet.   Nutrition II class: Lifestyle Skills:  -Group instruction provided by PowerPoint slides, verbal discussion, and  written materials to support subject matter. The instructor gives an explanation and review of label reading, grocery shopping for heart health, heart healthy recipe modifications, and ways to make healthier choices when eating out.   Diabetes Question & Answer:  -Group instruction provided by PowerPoint slides, verbal discussion, and written materials to support subject matter. The instructor gives an explanation and review of diabetes co-morbidities, pre- and post-prandial blood glucose goals, pre-exercise blood glucose goals, signs, symptoms, and treatment of hypoglycemia and hyperglycemia, and foot care basics.   Diabetes Blitz:  -Group instruction provided by PowerPoint slides, verbal discussion, and written materials to support subject matter. The instructor gives an explanation and review of the physiology behind type 1 and type 2 diabetes, diabetes medications and rational behind using different medications, pre- and post-prandial blood glucose recommendations and Hemoglobin A1c goals, diabetes diet, and exercise including blood glucose guidelines for exercising safely.    Portion Distortion:  -Group instruction provided by PowerPoint slides, verbal discussion, written materials, and food models to support subject matter. The instructor gives an explanation of serving size versus portion size, changes in portions sizes over the last 20 years, and what consists of a serving from each food group.   Stress Management:  -Group instruction provided by verbal instruction, video, and written materials to support subject matter.  Instructors review role of stress in heart disease and how to cope with stress positively.     Exercising on Your Own:  -Group instruction provided by verbal instruction, power point, and written materials to support subject.  Instructors discuss benefits of exercise, components of exercise, frequency and intensity of exercise, and end points for exercise.  Also discuss  use of nitroglycerin and activating EMS.  Review options of places to exercise outside of rehab.  Review guidelines for sex with heart disease.   Cardiac Drugs I:  -Group instruction provided by verbal instruction and written materials to support subject.  Instructor reviews cardiac drug classes: antiplatelets, anticoagulants, beta blockers, and statins.  Instructor discusses reasons, side effects, and lifestyle considerations for each drug class.   Cardiac Drugs II:  -Group instruction provided by verbal instruction and written materials to support subject.  Instructor reviews cardiac drug classes: angiotensin converting enzyme inhibitors (ACE-I), angiotensin II receptor blockers (ARBs), nitrates, and calcium channel blockers.  Instructor discusses reasons, side effects, and lifestyle considerations for each drug class.   Anatomy and Physiology of the Circulatory System:  Group verbal and written instruction and models provide basic cardiac anatomy and physiology, with the coronary electrical and arterial systems. Review of: AMI, Angina, Valve disease, Heart Failure, Peripheral Artery Disease, Cardiac Arrhythmia, Pacemakers, and the ICD.   Other Education:  -Group or individual verbal, written, or video instructions that support the  educational goals of the cardiac rehab program.   Holiday Eating Survival Tips:  -Group instruction provided by PowerPoint slides, verbal discussion, and written materials to support subject matter. The instructor gives patients tips, tricks, and techniques to help them not only survive but enjoy the holidays despite the onslaught of food that accompanies the holidays.   Knowledge Questionnaire Score: Knowledge Questionnaire Score - 03/22/18 1118      Knowledge Questionnaire Score   Pre Score  19/24       Core Components/Risk Factors/Patient Goals at Admission: Personal Goals and Risk Factors at Admission - 03/22/18 1118      Core Components/Risk  Factors/Patient Goals on Admission    Weight Management  Yes;Weight Maintenance;Weight Loss    Intervention  Weight Management: Develop a combined nutrition and exercise program designed to reach desired caloric intake, while maintaining appropriate intake of nutrient and fiber, sodium and fats, and appropriate energy expenditure required for the weight goal.;Weight Management: Provide education and appropriate resources to help participant work on and attain dietary goals.;Weight Management/Obesity: Establish reasonable short term and long term weight goals.    Admit Weight  192 lb 0.3 oz (87.1 kg)    Expected Outcomes  Short Term: Continue to assess and modify interventions until short term weight is achieved;Long Term: Adherence to nutrition and physical activity/exercise program aimed toward attainment of established weight goal;Weight Maintenance: Understanding of the daily nutrition guidelines, which includes 25-35% calories from fat, 7% or less cal from saturated fats, less than 200mg  cholesterol, less than 1.5gm of sodium, & 5 or more servings of fruits and vegetables daily;Weight Loss: Understanding of general recommendations for a balanced deficit meal plan, which promotes 1-2 lb weight loss per week and includes a negative energy balance of [phone] kcal/d;Understanding of distribution of calorie intake throughout the day with the consumption of 4-5 meals/snacks;Understanding recommendations for meals to include 15-35% energy as protein, 25-35% energy from fat, 35-60% energy from carbohydrates, less than 200mg  of dietary cholesterol, 20-35 gm of total fiber daily    Hypertension  Yes    Intervention  Provide education on lifestyle modifcations including regular physical activity/exercise, weight management, moderate sodium restriction and increased consumption of fresh fruit, vegetables, and low fat dairy, alcohol moderation, and smoking cessation.;Monitor prescription use compliance.    Expected  Outcomes  Short Term: Continued assessment and intervention until BP is < 140/55mm HG in hypertensive participants. < 130/27mm HG in hypertensive participants with diabetes, heart failure or chronic kidney disease.;Long Term: Maintenance of blood pressure at goal levels.    Lipids  Yes    Intervention  Provide education and support for participant on nutrition & aerobic/resistive exercise along with prescribed medications to achieve LDL 70mg , HDL >40mg .    Expected Outcomes  Short Term: Participant states understanding of desired cholesterol values and is compliant with medications prescribed. Participant is following exercise prescription and nutrition guidelines.;Long Term: Cholesterol controlled with medications as prescribed, with individualized exercise RX and with personalized nutrition plan. Value goals: LDL < 70mg , HDL > 40 mg.    Stress  Yes    Intervention  Offer individual and/or small group education and counseling on adjustment to heart disease, stress management and health-related lifestyle change. Teach and support self-help strategies.;Refer participants experiencing significant psychosocial distress to appropriate mental health specialists for further evaluation and treatment. When possible, include family members and significant others in education/counseling sessions.    Expected Outcomes  Short Term: Participant demonstrates changes in health-related behavior, relaxation and other stress management  skills, ability to obtain effective social support, and compliance with psychotropic medications if prescribed.;Long Term: Emotional wellbeing is indicated by absence of clinically significant psychosocial distress or social isolation.       Core Components/Risk Factors/Patient Goals Review:  Goals and Risk Factor Review    Row Name 04/12/18 1535             Core Components/Risk Factors/Patient Goals Review   Personal Goals Review  Weight  Management/Obesity;Hypertension;Stress;Lipids       Review  [facility] cardiac rehab has been on hold post catherization. Horace is scheduled to resume exercise per Dr Johnsie Cancel on Monday       Expected Outcomes  Patient will continue to participate in phase 2 cardiac rehab for exercise, nutrition and lifestyle modifications.          Core Components/Risk Factors/Patient Goals at Discharge (Final Review):  Goals and Risk Factor Review - 04/12/18 1535      Core Components/Risk Factors/Patient Goals Review   Personal Goals Review  Weight Management/Obesity;Hypertension;Stress;Lipids    Review  Card[facility]'s cardiac rehab has been on hold post catherization. Lamoine is scheduled to resume exercise per Dr Johnsie Cancel on Monday    Expected Outcomes  Patient will continue to participate in phase 2 cardiac rehab for exercise, nutrition and lifestyle modifications.       ITP Comments: ITP Comments    Row Name 03/22/18 1158 04/12/18 1533         ITP Comments  Dr. Fransico Him. Medical Director  30 Day ITP Review. [facility] cardiac rehab will restart next week post cardiac catherization         Comments: See ITP comments.Barnet Pall, RN,BSN 04/12/2018 3:39 PM

## 2018-04-13 ENCOUNTER — Encounter (HOSPITAL_COMMUNITY): Payer: Medicare Other

## 2018-04-13 ENCOUNTER — Ambulatory Visit (HOSPITAL_COMMUNITY): Payer: Medicare Other

## 2018-04-16 ENCOUNTER — Encounter (HOSPITAL_COMMUNITY)
Admission: RE | Admit: 2018-04-16 | Discharge: 2018-04-16 | Disposition: A | Discharge status: Home / Self Care | Payer: Medicare Other | Source: Ambulatory Visit | Attending: Cardiovascular Disease | Admitting: Cardiovascular Disease

## 2018-04-16 ENCOUNTER — Ambulatory Visit (HOSPITAL_COMMUNITY): Payer: Medicare Other

## 2018-04-16 DIAGNOSIS — Z955 Presence of coronary angioplasty implant and graft: Secondary | ICD-10-CM | POA: Insufficient documentation

## 2018-04-16 NOTE — Progress Notes (Signed)
Daiwik returned to exercise at cardiac rehab. Patient exercised without difficulty. Vital signs stable. Will give Mr [name] [name] a copy of today's exercise flow sheet to take with him to

## 2018-04-17 ENCOUNTER — Ambulatory Visit (INDEPENDENT_AMBULATORY_CARE_PROVIDER_SITE_OTHER): Payer: Medicare Other | Admitting: Cardiovascular Disease

## 2018-04-17 ENCOUNTER — Encounter: Payer: Self-pay | Admitting: Family Medicine

## 2018-04-17 ENCOUNTER — Telehealth: Payer: Self-pay | Admitting: *Deleted

## 2018-04-17 ENCOUNTER — Telehealth: Payer: Self-pay | Admitting: Cardiovascular Disease

## 2018-04-17 VITALS — BP 132/76 | HR 53 | Ht 66.0 in | Wt 194.0 lb

## 2018-04-17 DIAGNOSIS — I25118 Atherosclerotic heart disease of native coronary artery with other forms of angina pectoris: Secondary | ICD-10-CM

## 2018-04-17 DIAGNOSIS — I10 Essential (primary) hypertension: Secondary | ICD-10-CM | POA: Diagnosis not present

## 2018-04-17 DIAGNOSIS — I209 Angina pectoris, unspecified: Secondary | ICD-10-CM

## 2018-04-17 DIAGNOSIS — E782 Mixed hyperlipidemia: Secondary | ICD-10-CM

## 2018-04-17 NOTE — Telephone Encounter (Signed)
Called [name] [name] with Dr. Durenda Age office. Informed them that patient does not need any antibiotics. They requested if he would need to hold blood thinners for any future procedures. Informed them if and when patient has procedures to fax our office clearance to hold blood thinners. [name] [name] verbalized understanding.

## 2018-04-17 NOTE — Telephone Encounter (Signed)
   [facility] Pre-operative Risk Assessment    Request for surgical clearance:  1. What type of surgery is being performed? POSSIBLE FILLINGS AND EXTRACTIONS   2. When is this surgery scheduled? TBD   3. What type of clearance is required (medical clearance vs. Pharmacy clearance to hold med vs. Both)? MEDICAL  4. Are there any medications that need to be held prior to surgery and how long?PLAVIX, PT IS ALSO ON ASA   5. Practice name and name of physician performing surgery? Daryll Brod., DDS, PA   6. What is your office phone number [phone]    7.   What is your office fax number [phone]  8.   Anesthesia type (None, local, MAC, general) ? NONE LISTED   Julaine Hua 04/17/2018, 5:13 PM  _________________________________________________________________   (provider comments below)

## 2018-04-17 NOTE — Telephone Encounter (Signed)
No

## 2018-04-17 NOTE — Patient Instructions (Signed)

## 2018-04-17 NOTE — Telephone Encounter (Signed)
New Message   1. What dental office are you calling from? [facility]    2. What is your office phone number? [phone]   3. What is your fax number? [phone]  4. What type of procedure is the patient having performed? Dental Cleaning  5. What date is procedure scheduled or is the patient there now? Patient is there now for cleaning  (if the patient is at the dentist's office question goes to their cardiologist if he/she is in the office.  If not, question should go to the DOD).   What is your question (ex. Antibiotics prior to procedure, holding medication-we need to know how long dentist wants pt to hold med)? Antibiotic prior due to stent

## 2018-04-18 ENCOUNTER — Encounter (HOSPITAL_COMMUNITY)
Admission: RE | Admit: 2018-04-18 | Discharge: 2018-04-18 | Disposition: A | Discharge status: Home / Self Care | Payer: Medicare Other | Source: Ambulatory Visit | Attending: Cardiovascular Disease | Admitting: Cardiovascular Disease

## 2018-04-18 ENCOUNTER — Ambulatory Visit (HOSPITAL_COMMUNITY): Payer: Medicare Other

## 2018-04-18 DIAGNOSIS — Z955 Presence of coronary angioplasty implant and graft: Secondary | ICD-10-CM

## 2018-04-18 NOTE — Telephone Encounter (Signed)
[name], from your perspective, what would be the earliest date he can hold ASA and plavix?

## 2018-04-18 NOTE — Telephone Encounter (Signed)
Callback pool to check with requesting provider to see how many extractions expected and if can be done on ASA and plavix as his stent was placed on 01/17/2018. Is his procedure urgent. Cath report at the time mentioned ASA and plavix for a minimum of 6 month after stent placement. Recent cath 04/06/2018 showed patent stent.   [name], patient just saw you next yesterday.

## 2018-04-18 NOTE — Telephone Encounter (Signed)
Called Dr Durenda Age office. Per the coordinator at his office, patient is not currently scheduled to have extractions. They would like to know if restorative dental work is necessary, can the patient hold ASA and Plavix?

## 2018-04-19 NOTE — Telephone Encounter (Signed)
Fax clearance via EPIC

## 2018-04-19 NOTE — Telephone Encounter (Signed)
I would stay on the Omeprazole for now, but his symptoms are likely due to residual angina. Have him ask [name] about starting on Ranexa for this

## 2018-04-19 NOTE — Telephone Encounter (Signed)
   Primary Cardiologist: Jenkins Rouge, MD  Chart reviewed as part of pre-operative protocol coverage. Given past medical history and time since last visit, based on ACC/AHA guidelines, [name] [name]. would be at acceptable risk for the planned procedure without further cardiovascular testing.   Dr.Nishan has also documented that he can hold ASA and Plavix prior to the procedure.  We recommend that he hold ASA and Plavix for 5 days, and begin again the following day after procedure.   I will route this recommendation to the requesting party via Epic fax function and remove from pre-op pool.  Please call with questions.  [name]. Kire Ferg DNP, ANP, AACC  04/19/2018, 9:28 AM

## 2018-04-19 NOTE — Telephone Encounter (Signed)
Dr. Fry please advise. Thanks  

## 2018-04-19 NOTE — Telephone Encounter (Signed)
He has not had recent intervention so don't see why he couldn't hold at any time

## 2018-04-20 ENCOUNTER — Ambulatory Visit (HOSPITAL_COMMUNITY): Payer: Medicare Other

## 2018-04-20 ENCOUNTER — Encounter (HOSPITAL_COMMUNITY)
Admission: RE | Admit: 2018-04-20 | Discharge: 2018-04-20 | Disposition: A | Discharge status: Home / Self Care | Payer: Medicare Other | Source: Ambulatory Visit | Attending: Cardiovascular Disease | Admitting: Cardiovascular Disease

## 2018-04-20 DIAGNOSIS — Z955 Presence of coronary angioplasty implant and graft: Secondary | ICD-10-CM

## 2018-04-20 NOTE — Progress Notes (Signed)
[name] [name]. 73 y.o. male DOB: 01/17/1946 MRN: [id number]      Nutrition Note  1. Status post coronary artery stent placement    Past Medical History:  Diagnosis Date  . ABNORMAL EKG 10/07/2008  . ADVEF, DRUG/MED/BIOL SUBST, OTHER DRUG NOS 09/26/2006  . Allergy    seasonal  . Cataract    left eye-surgery 07-2015  . DEGENERATIVE JOINT DISEASE, MODERATE 04/13/2007  . Diabetes mellitus    type 2, diet controlled   . DIVERTICULOSIS, COLON 04/13/2007  . ELEVATED PROSTATE SPECIFIC ANTIGEN 04/14/2007  . Glaucoma    sees [name]   . GOUT 05/16/2007  . HYPERLIPIDEMIA 09/26/2006  . HYPERTENSION 04/13/2007  . JOINT EFFUSION, KNEE 06/15/2009  . Melanoma [facility])    sees [name]   . PROSTATE CANCER 10/05/2009   sees [name]  . Renal insufficiency    RESOLVED PER PATIENT   Meds reviewed.    Current Outpatient Medications (Cardiovascular):  .  fenofibrate 160 MG tablet, Take 1 tablet (160 mg total) by mouth daily. (Patient taking differently: Take 160 mg by mouth every evening. ) .  isosorbide mononitrate (IMDUR) 30 MG 24 hr tablet, Take 1 tablet (30 mg total) by mouth daily. Marland Kitchen  lisinopril (PRINIVIL,ZESTRIL) 20 MG tablet, Take 1 tablet (20 mg total) by mouth daily. .  nitroGLYCERIN (NITROSTAT) 0.4 MG SL tablet, Place 1 tablet (0.4 mg total) under the tongue every 5 (five) minutes as needed. (Patient taking differently: Place 0.4 mg under the tongue every 5 (five) minutes as needed for chest pain. ) .  rosuvastatin (CRESTOR) 20 MG tablet, Take 20 mg by mouth every evening.    Current Outpatient Medications (Analgesics):  .  aspirin EC 81 MG tablet, Take 81 mg by mouth every evening.   Current Outpatient Medications (Hematological):  .  clopidogrel (PLAVIX) 75 MG tablet, Take 1 tablet (75 mg total) by mouth daily with breakfast.  Current Outpatient Medications (Other):  .  bimatoprost (LUMIGAN) 0.01 % SOLN, Place 1 drop into both eyes at bedtime. .  Coenzyme Q10 (COQ-10) 100 MG CAPS,  Take 100 mg by mouth every evening.  .  dorzolamide-timolol (COSOPT) 22.3-6.8 MG/ML ophthalmic solution, Place 1 drop into the right eye 2 (two) times daily. .  magnesium oxide (MAG-OX) 400 MG tablet, Take 400 mg by mouth every evening.  .  Multiple Vitamins-Minerals (MULTIVITAMIN WITH MINERALS) tablet, Take 1 tablet by mouth 2 (two) times a week. Sundays and Wednesdays .  omeprazole (PRILOSEC) 40 MG capsule, TAKE 1 CAPSULE BY MOUTH EVERY DAY   HT: Ht Readings from Last 1 Encounters:  04/17/18 5\' 6"  (1.676 m)    WT: Wt Readings from Last 5 Encounters:  04/17/18 194 lb (88 kg)  04/10/18 195 lb 2 oz (88.5 kg)  04/06/18 190 lb (86.2 kg)  03/29/18 191 lb (86.6 kg)  03/22/18 192 lb 0.3 oz (87.1 kg)     There is no height or weight on file to calculate BMI.   Current tobacco use? No  Labs:  Lipid Panel     Component Value Date/Time   CHOL 163 11/07/2017 1133   TRIG 165.0 (H) 11/07/2017 1133   HDL 43.10 11/07/2017 1133   CHOLHDL 4 11/07/2017 1133   VLDL 33.0 11/07/2017 1133   LDLCALC 87 11/07/2017 1133   LDLDIRECT 82.1 04/07/2011 0807    Lab Results  Component Value Date   HGBA1C 6.0 11/07/2017   CBG (last 3)  No results for input(s): GLUCAP  in the last 72 hours.  Nutrition Note Spoke with pt. Nutrition plan and goals reviewed with pt. Pt is following Step 2 of the Therapeutic Lifestyle Changes diet. Pt wants to lose wt. Pt has been trying to lose wt by increasing physical activity. Reviewed the benefits that nutrition can also have with weight loss, pt expressed openness to dietitian suggestions today. Wt loss tips reviewed (label reading, how to build a healthy plate, portion sizes, eating frequently across the day). Pt last A1C was elevated. Discussed the differences between complex and refined carbs, recommended pt replace refined carbs with complex. Reviewed the benefits swapping in complex carbs and moderating portion sizes can have on managing blood glucose with patient.  Per discussion, pt does not use canned/convenience foods often. Pt does not add salt to food. Pt does not eat out frequently. Pt expressed understanding of the information reviewed. Pt aware of nutrition education classes offered and would like to attend nutrition classes.  Nutrition Diagnosis ? Food-and nutrition-related knowledge deficit related to lack of exposure to information as related to diagnosis of: ? CVD   Nutrition Intervention ? Pt's individual nutrition plan and goals reviewed with pt. ? Pt given handouts for: ? Nutrition I class ? Nutrition II class   Nutrition Goal(s):  ? Pt to identify food quantities necessary to achieve weight loss of 6-24 lb at graduation from cardiac rehab.  ? Pt able to name foods that affect blood glucose ? Pt to identify and limit food sources of saturated fat, trans fat, refined carbohydrates and sodium  Plan:  ? Pt to attend nutrition classes:  ? Nutrition I ? Nutrition II ? Portion Distortion  ? Will provide client-centered nutrition education as part of interdisciplinary care ? Monitor and evaluate progress toward nutrition goal with team.   [name], MS, RD, LDN 04/20/2018 2:53 PM

## 2018-04-23 ENCOUNTER — Ambulatory Visit (HOSPITAL_COMMUNITY): Payer: Medicare Other

## 2018-04-23 ENCOUNTER — Encounter (HOSPITAL_COMMUNITY)
Admission: RE | Admit: 2018-04-23 | Discharge: 2018-04-23 | Disposition: A | Discharge status: Home / Self Care | Payer: Medicare Other | Source: Ambulatory Visit | Attending: Cardiovascular Disease | Admitting: Cardiovascular Disease

## 2018-04-23 DIAGNOSIS — Z955 Presence of coronary angioplasty implant and graft: Secondary | ICD-10-CM

## 2018-04-25 ENCOUNTER — Ambulatory Visit (HOSPITAL_COMMUNITY): Payer: Medicare Other

## 2018-04-25 ENCOUNTER — Encounter (HOSPITAL_COMMUNITY)
Admission: RE | Admit: 2018-04-25 | Discharge: 2018-04-25 | Disposition: A | Discharge status: Home / Self Care | Payer: Medicare Other | Source: Ambulatory Visit | Attending: Cardiovascular Disease | Admitting: Cardiovascular Disease

## 2018-04-25 DIAGNOSIS — Z955 Presence of coronary angioplasty implant and graft: Secondary | ICD-10-CM

## 2018-04-25 NOTE — Progress Notes (Signed)
Reviewed home exercise guidelines with patient including endpoints, temperature precautions, target heart rate and rate of perceived exertion. Pt is swimming 2 days/week as his mode of home exercise. Pt also has a treadmill at home that he uses when he is unable to swim.  Pt voices understanding of instructions given. Sol Passer, MS, ACSM CEP

## 2018-04-26 DIAGNOSIS — H40053 Ocular hypertension, bilateral: Secondary | ICD-10-CM | POA: Diagnosis not present

## 2018-04-27 ENCOUNTER — Ambulatory Visit (HOSPITAL_COMMUNITY): Payer: Medicare Other

## 2018-04-27 ENCOUNTER — Encounter (HOSPITAL_COMMUNITY)
Admission: RE | Admit: 2018-04-27 | Discharge: 2018-04-27 | Disposition: A | Discharge status: Home / Self Care | Payer: Medicare Other | Source: Ambulatory Visit | Attending: Cardiovascular Disease | Admitting: Cardiovascular Disease

## 2018-04-27 DIAGNOSIS — Z955 Presence of coronary angioplasty implant and graft: Secondary | ICD-10-CM | POA: Diagnosis not present

## 2018-04-30 ENCOUNTER — Ambulatory Visit (HOSPITAL_COMMUNITY): Payer: Medicare Other

## 2018-04-30 ENCOUNTER — Encounter (HOSPITAL_COMMUNITY)
Admission: RE | Admit: 2018-04-30 | Discharge: 2018-04-30 | Disposition: A | Discharge status: Home / Self Care | Payer: Medicare Other | Source: Ambulatory Visit | Attending: Cardiovascular Disease | Admitting: Cardiovascular Disease

## 2018-04-30 DIAGNOSIS — Z955 Presence of coronary angioplasty implant and graft: Secondary | ICD-10-CM

## 2018-05-02 ENCOUNTER — Ambulatory Visit (HOSPITAL_COMMUNITY): Payer: Medicare Other

## 2018-05-02 ENCOUNTER — Encounter (HOSPITAL_COMMUNITY)
Admission: RE | Admit: 2018-05-02 | Discharge: 2018-05-02 | Disposition: A | Discharge status: Home / Self Care | Payer: Medicare Other | Source: Ambulatory Visit | Attending: Cardiovascular Disease | Admitting: Cardiovascular Disease

## 2018-05-02 DIAGNOSIS — Z955 Presence of coronary angioplasty implant and graft: Secondary | ICD-10-CM

## 2018-05-03 NOTE — Progress Notes (Signed)
Cardiac Individual Treatment Plan  Patient Details  Name: [name] [name]. MRN: [id number] Date of Birth: 03-19-1945 Referring Provider:     CARDIAC REHAB PHASE II ORIENTATION from 03/22/2018 in [place]  Referring Provider  [name]      Initial Encounter Date:    CARDIAC REHAB PHASE II ORIENTATION from 03/22/2018 in [place]  Date  03/22/18      Visit Diagnosis: Status post coronary artery stent placement  Patient's Home Medications on Admission:  Current Outpatient Medications:  .  aspirin EC 81 MG tablet, Take 81 mg by mouth every evening. , Disp: , Rfl:  .  bimatoprost (LUMIGAN) 0.01 % SOLN, Place 1 drop into both eyes at bedtime., Disp: , Rfl:  .  clopidogrel (PLAVIX) 75 MG tablet, Take 1 tablet (75 mg total) by mouth daily with breakfast., Disp: 90 tablet, Rfl: 8 .  Coenzyme Q10 (COQ-10) 100 MG CAPS, Take 100 mg by mouth every evening. , Disp: , Rfl:  .  dorzolamide-timolol (COSOPT) 22.3-6.8 MG/ML ophthalmic solution, Place 1 drop into both eyes 2 (two) times daily. , Disp: , Rfl:  .  fenofibrate 160 MG tablet, Take 1 tablet (160 mg total) by mouth daily. (Patient taking differently: Take 160 mg by mouth every evening. ), Disp: 90 tablet, Rfl: 3 .  isosorbide mononitrate (IMDUR) 30 MG 24 hr tablet, Take 1 tablet (30 mg total) by mouth daily., Disp: 90 tablet, Rfl: 3 .  lisinopril (PRINIVIL,ZESTRIL) 20 MG tablet, Take 1 tablet (20 mg total) by mouth daily., Disp: 90 tablet, Rfl: 3 .  magnesium oxide (MAG-OX) 400 MG tablet, Take 400 mg by mouth every evening. , Disp: , Rfl:  .  Multiple Vitamins-Minerals (MULTIVITAMIN WITH MINERALS) tablet, Take 1 tablet by mouth 2 (two) times a week. Sundays and Wednesdays, Disp: , Rfl:  .  nitroGLYCERIN (NITROSTAT) 0.4 MG SL tablet, Place 1 tablet (0.4 mg total) under the tongue every 5 (five) minutes as needed. (Patient taking differently: Place 0.4 mg under the tongue every  5 (five) minutes as needed for chest pain. ), Disp: 25 tablet, Rfl: 3 .  omeprazole (PRILOSEC) 40 MG capsule, TAKE 1 CAPSULE BY MOUTH EVERY DAY, Disp: 90 capsule, Rfl: 3 .  rosuvastatin (CRESTOR) 20 MG tablet, Take 20 mg by mouth every evening. , Disp: , Rfl:   Past Medical History: Past Medical History:  Diagnosis Date  . ABNORMAL EKG 10/07/2008  . ADVEF, DRUG/MED/BIOL SUBST, OTHER DRUG NOS 09/26/2006  . Allergy    seasonal  . Cataract    left eye-surgery 07-2015  . DEGENERATIVE JOINT DISEASE, MODERATE 04/13/2007  . Diabetes mellitus    type 2, diet controlled   . DIVERTICULOSIS, COLON 04/13/2007  . ELEVATED PROSTATE SPECIFIC ANTIGEN 04/14/2007  . Glaucoma    sees [name]   . GOUT 05/16/2007  . HYPERLIPIDEMIA 09/26/2006  . HYPERTENSION 04/13/2007  . JOINT EFFUSION, KNEE 06/15/2009  . Melanoma [facility])    sees [name]   . PROSTATE CANCER 10/05/2009   sees [name]  . Renal insufficiency    RESOLVED PER PATIENT    Tobacco Use: Social History   Tobacco Use  Smoking Status Former Smoker  . Packs/day: 1.00  . Years: 9.00  . Pack years: 9.00  . Start date: 03/08/1963  . Last attempt to quit: 03/07/1972  . Years since quitting: 46.2  Smokeless Tobacco Never Used  Tobacco Comment   States AAA completed  Labs: Recent Review Flowsheet Data    Labs for ITP Cardiac and Pulmonary Rehab Latest Ref Rng & Units 05/05/2016 11/08/2016 05/09/2017 11/07/2017 05/08/2018   Cholestrol 0 - 200 mg/dL 127 135 132 163 150   LDLCALC 0 - 99 mg/dL 64 67 71 87 82   LDLDIRECT mg/dL - - - - -   HDL >39.00 mg/dL 41.70 50.30 45.00 43.10 44.40   Trlycerides 0.0 - 149.0 mg/dL 107.0 88.0 80.0 165.0(H) 117.0   Hemoglobin A1c 4.6 - 6.5 % 6.0 5.7 5.9 6.0 6.0      Capillary Blood Glucose: Lab Results  Component Value Date   GLUCAP 93 04/06/2018   GLUCAP 91 03/28/2018   GLUCAP 140 (H) 03/28/2018   GLUCAP 94 11/01/2011     Exercise Target Goals: Exercise Program Goal: Individual exercise prescription set  using results from initial 6 min walk test and THRR while considering  patient's activity barriers and safety.   Exercise Prescription Goal: Initial exercise prescription builds to 30-45 minutes a day of aerobic activity, 2-3 days per week.  Home exercise guidelines will be given to patient during program as part of exercise prescription that the participant will acknowledge.  Activity Barriers & Risk Stratification: Activity Barriers & Cardiac Risk Stratification - 03/22/18 1113      Activity Barriers & Cardiac Risk Stratification   Activity Barriers  Arthritis;Other (comment);Joint Problems    Comments  Torn rotator cuff (right)     Cardiac Risk Stratification  Moderate       6 Minute Walk: 6 Minute Walk    Row Name 03/22/18 1112         6 Minute Walk   Phase  Initial     Distance  1691 feet     Walk Time  6 minutes     # of Rest Breaks  0     MPH  3.2     METS  3.4     RPE  11     VO2 Peak  11.85     Symptoms  No     Resting HR  66 bpm     Resting BP  130/72     Resting Oxygen Saturation   97 %     Exercise Oxygen Saturation  during 6 min walk  97 %     Max Ex. HR  91 bpm     Max Ex. BP  142/82     2 Minute Post BP  122/78        Oxygen Initial Assessment:   Oxygen Re-Evaluation:   Oxygen Discharge (Final Oxygen Re-Evaluation):   Initial Exercise Prescription: Initial Exercise Prescription - 03/22/18 1100      Date of Initial Exercise RX and Referring Provider   Date  03/22/18    Referring Provider  [name]    Expected Discharge Date  06/29/18      Treadmill   MPH  3    Grade  0    Minutes  10      Recumbant Bike   Level  2    Watts  35    Minutes  10    METs  3.3      NuStep   Level  3    SPM  85    Minutes  10    METs  3      Prescription Details   Frequency (times per week)  3    Duration  Progress to 30 minutes of continuous aerobic  without signs/symptoms of physical distress      Intensity   THRR 40-80% of Max Heartrate   60-120    Ratings of Perceived Exertion  11-13      Progression   Progression  Continue to progress workloads to maintain intensity without signs/symptoms of physical distress.      Resistance Training   Training Prescription  Yes    Weight  4 lbs.     Reps  10-15       Perform Capillary Blood Glucose checks as needed.  Exercise Prescription Changes:  Exercise Prescription Changes    Row Name 03/28/18 1456 04/16/18 1451 04/30/18 1450         Response to Exercise   Blood Pressure (Admit)  120/80  148/82  128/78     Blood Pressure (Exercise)  172/60  142/70  148/72     Blood Pressure (Exit)  118/70  120/72  112/68     Heart Rate (Admit)  96 bpm  97 bpm  66 bpm     Heart Rate (Exercise)  121 bpm  111 bpm  123 bpm     Heart Rate (Exit)  89 bpm  93 bpm  68 bpm     Rating of Perceived Exertion (Exercise)  12  12  12      Symptoms  Mild chest pressure/SOB on TM resolved w/ dec WL  -  -     Comments  -  Pt returned to exe after cath procedure. Tol well.  -     Duration  Progress to 30 minutes of  aerobic without signs/symptoms of physical distress  Progress to 30 minutes of  aerobic without signs/symptoms of physical distress  Progress to 30 minutes of  aerobic without signs/symptoms of physical distress     Intensity  THRR unchanged  THRR unchanged  THRR unchanged       Progression   Progression  Continue to progress workloads to maintain intensity without signs/symptoms of physical distress.  Continue to progress workloads to maintain intensity without signs/symptoms of physical distress.  Continue to progress workloads to maintain intensity without signs/symptoms of physical distress.     Average METs  3  3.2  4.7       Resistance Training   Training Prescription  No Relaxation day, no weights  Yes  Yes     Weight  -  4lbs  4lbs     Reps  -  10-15  10-15     Time  -  10 Minutes  10 Minutes       Interval Training   Interval Training  No  No  No       Treadmill   MPH  3.3  Dec Wl back to 3.0/0 when pt c/o chest pressure/SOB  2.8 Dec WL back to 2.8/0. First day back after cath.  3     Grade  0  0  2     Minutes  10  10  10      METs  3.53  3.14  4.12       Bike   Level  2 SciFit bike  2 SciFit bike  4 SciFit bike     Minutes  10  10  10      METs  3  -  6.4       Recumbant Bike   Level  -  -  -     Watts  -  -  -  Minutes  -  -  -     METs  -  -  -       NuStep   Level  3  3  5      SPM  85  85  85     Minutes  10  10  10      METs  2.5  3.2  3.6       Home Exercise Plan   Plans to continue exercise at  [employer] (comment) Swimming. 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  Forensic scientist (comment) Swimming. 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  Forensic scientist (comment) Swimming. 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     Frequency  Add 3 additional days to program exercise sessions.  Add 4 additional days to program exercise sessions.  Add 4 additional days to program exercise sessions.        Exercise Comments:  Exercise Comments    Row Name 03/28/18 1551 04/03/18 1448 04/16/18 1512 04/25/18 1535 04/30/18 1507   Exercise Comments  Patient tolerated 1st session of exercise fairly well with some chest pressure/SOB while walking on treadmill. Pt will discuss symptoms with [name] at follow-up appointment tomorrow.  Patient is scheduled for cardiac cath on 04/06/2018, will hold exercise pending cath results.  Reviewed METs and goals with patient.  Reviewed home exercise guidelines, METs, and goals with patient.  METs reviewed with patient.      Exercise Goals and Review:  Exercise Goals    Row Name 03/22/18 1114             Exercise Goals   Increase Physical Activity  Yes       Intervention  Provide advice, education, support and counseling about physical activity/exercise needs.;Develop an individualized exercise prescription for aerobic and resistive training based on initial evaluation findings, risk stratification, comorbidities and participant's personal  goals.       Expected Outcomes  Short Term: Attend rehab on a regular basis to increase amount of physical activity.;Long Term: Add in home exercise to make exercise part of routine and to increase amount of physical activity.;Long Term: Exercising regularly at least 3-5 days a week.       Increase Strength and Stamina  Yes       Intervention  Provide advice, education, support and counseling about physical activity/exercise needs.;Develop an individualized exercise prescription for aerobic and resistive training based on initial evaluation findings, risk stratification, comorbidities and participant's personal goals.       Expected Outcomes  Short Term: Increase workloads from initial exercise prescription for resistance, speed, and METs.       Able to understand and use rate of perceived exertion (RPE) scale  Yes       Intervention  Provide education and explanation on how to use RPE scale       Expected Outcomes  Short Term: Able to use RPE daily in rehab to express subjective intensity level;Long Term:  Able to use RPE to guide intensity level when exercising independently       Knowledge and understanding of Target Heart Rate Range (THRR)  Yes       Intervention  Provide education and explanation of THRR including how the numbers were predicted and where they are located for reference       Expected Outcomes  Short Term: Able to state/look up THRR;Long Term: Able to use THRR to govern intensity when exercising independently;Short Term: Able to use daily as guideline for intensity in rehab  Able to check pulse independently  Yes       Intervention  Provide education and demonstration on how to check pulse in carotid and radial arteries.;Review the importance of being able to check your own pulse for safety during independent exercise       Expected Outcomes  Short Term: Able to explain why pulse checking is important during independent exercise;Long Term: Able to check pulse independently and  accurately       Understanding of Exercise Prescription  Yes       Intervention  Provide education, explanation, and written materials on patient's individual exercise prescription       Expected Outcomes  Short Term: Able to explain program exercise prescription;Long Term: Able to explain home exercise prescription to exercise independently          Exercise Goals Re-Evaluation : Exercise Goals Re-Evaluation    Row Name 03/28/18 1551 04/16/18 1512 04/25/18 1535         Exercise Goal Re-Evaluation   Exercise Goals Review  Able to understand and use rate of perceived exertion (RPE) scale;Increase Physical Activity  Able to understand and use rate of perceived exertion (RPE) scale;Increase Physical Activity  Able to understand and use rate of perceived exertion (RPE) scale;Increase Physical Activity;Knowledge and understanding of Target Heart Rate Range (THRR);Understanding of Exercise Prescription;Able to check pulse independently;Increase Strength and Stamina     Comments  Patient able to understand and use RPE scale appropriately. Pt is swimming or walking on treadmill at home 30 minutes, 3 days/week.  Patient returned to exercise today after being absent due to cath. Pt tolerated exercise well. Pt is back to swimming 30-40 mins, 3 days/week and walking on TM at the gym 30 mins 2 days/week. No symptoms with exercise.  Reviewed home exercise guidelines with patient including THRR, RPE scale, and endpoints for exercise. Patient is swimming at least 30 minutes, 2 days/week. Pt walk treadmill at home if he is unable to swim. Pt attended the pulse counting class, and states he can count his pulse.     Expected Outcomes  Progress workloads as tolerated to exercise below anginal threshold and onset of SOB.  Patient will continue daily exericse routine to help improve cardiorespiratory fitness.  Patient will exercise at least 30 minutes, at least 5 days/week to help achieve personal health and fitness  goals.        Discharge Exercise Prescription (Final Exercise Prescription Changes): Exercise Prescription Changes - 04/30/18 1450      Response to Exercise   Blood Pressure (Admit)  128/78    Blood Pressure (Exercise)  148/72    Blood Pressure (Exit)  112/68    Heart Rate (Admit)  66 bpm    Heart Rate (Exercise)  123 bpm    Heart Rate (Exit)  68 bpm    Rating of Perceived Exertion (Exercise)  12    Duration  Progress to 30 minutes of  aerobic without signs/symptoms of physical distress    Intensity  THRR unchanged      Progression   Progression  Continue to progress workloads to maintain intensity without signs/symptoms of physical distress.    Average METs  4.7      Resistance Training   Training Prescription  Yes    Weight  4lbs    Reps  10-15    Time  10 Minutes      Interval Training   Interval Training  No      Treadmill   MPH  3    Grade  2    Minutes  10    METs  4.12      Bike   Level  4   SciFit bike   Minutes  10    METs  6.4      NuStep   Level  5    SPM  85    Minutes  10    METs  3.6      Home Exercise Plan   Plans to continue exercise at  [employer] (comment)   Swimming. 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   Frequency  Add 4 additional days to program exercise sessions.       Nutrition:  Target Goals: Understanding of nutrition guidelines, daily intake of sodium 1500mg , cholesterol 200mg , calories 30% from fat and 7% or less from saturated fats, daily to have 5 or more servings of fruits and vegetables.  Biometrics: Pre Biometrics - 03/22/18 1115      Pre Biometrics   Height  5' 6.5" (1.689 m)    Weight  192 lb 0.3 oz (87.1 kg)    Waist Circumference  41 inches    Hip Circumference  41 inches    Waist to Hip Ratio  1 %    BMI (Calculated)  30.53    Triceps Skinfold  18 mm    % Body Fat  30.1 %    Grip Strength  45 kg    Flexibility  14 in    Single Leg Stand  30 seconds        Nutrition Therapy Plan and Nutrition  Goals: Nutrition Therapy & Goals - 03/22/18 1154      Nutrition Therapy   Diet  carb modified, heart healthy      Personal Nutrition Goals   Nutrition Goal  Pt to identify and limit food sources of saturated fat, trans fat, refined carbohydrates and sodium    Personal Goal #2  Pt to identify food quantities necessary to achieve weight loss of 6-24 lbs. at graduation from cardiac rehab.    Personal Goal #3  Pt able to name foods that affect blood glucose.      Intervention Plan   Intervention  Prescribe, educate and counsel regarding individualized specific dietary modifications aiming towards targeted core components such as weight, hypertension, lipid management, diabetes, heart failure and other comorbidities.    Expected Outcomes  Short Term Goal: Understand basic principles of dietary content, such as calories, fat, sodium, cholesterol and nutrients.;Long Term Goal: Adherence to prescribed nutrition plan.       Nutrition Assessments: Nutrition Assessments - 03/22/18 1208      MEDFICTS Scores   Pre Score  15       Nutrition Goals Re-Evaluation: Nutrition Goals Re-Evaluation    [place] Name 03/22/18 1154             Goals   Current Weight  192 lb 0.3 oz (87.1 kg)          Nutrition Goals Re-Evaluation: Nutrition Goals Re-Evaluation    Row Name 03/22/18 1154             Goals   Current Weight  192 lb 0.3 oz (87.1 kg)          Nutrition Goals Discharge (Final Nutrition Goals Re-Evaluation): Nutrition Goals Re-Evaluation - 03/22/18 1154      Goals   Current Weight  192 lb 0.3 oz (87.1 kg)       Psychosocial: Target Goals: Acknowledge presence  or absence of significant depression and/or stress, maximize coping skills, provide positive support system. Participant is able to verbalize types and ability to use techniques and skills needed for reducing stress and depression.  Initial Review & Psychosocial Screening: Initial Psych Review & Screening - 03/22/18 1202       Initial Review   Current issues with  None Identified      Family Dynamics   Good Support System?  Yes   Pt lists his spouse and family as sources of support.      Barriers   Psychosocial barriers to participate in program  There are no identifiable barriers or psychosocial needs.      Screening Interventions   Interventions  Encouraged to exercise       Quality of Life Scores: Quality of Life - 03/22/18 1115      Quality of Life   Select  Quality of Life      Quality of Life Scores   Health/Function Pre  28.3 %    Socioeconomic Pre  26.43 %    Psych/Spiritual Pre  30 %    Family Pre  29.5 %    GLOBAL Pre  28.44 %      Scores of 19 and below usually indicate a poorer quality of life in these areas.  A difference of  2-3 points is a clinically meaningful difference.  A difference of 2-3 points in the total score of the Quality of Life Index has been associated with significant improvement in overall quality of life, self-image, physical symptoms, and general health in studies assessing change in quality of life.  PHQ-9: Recent Review Flowsheet Data    Depression screen [facility] 2/9 03/28/2018 05/09/2017 05/09/2017 05/05/2016 05/05/2016   Decreased Interest 0 0 0 0 0   Down, Depressed, Hopeless 0 0 0 0 0   PHQ - 2 Score 0 0 0 0 0     Interpretation of Total Score  Total Score Depression Severity:  1-4 = Minimal depression, 5-9 = Mild depression, 10-14 = Moderate depression, 15-19 = Moderately severe depression, 20-27 = Severe depression   Psychosocial Evaluation and Intervention:   Psychosocial Re-Evaluation: Psychosocial Re-Evaluation    [place] Name 04/12/18 1534 05/03/18 1629           Psychosocial Re-Evaluation   Current issues with  Current Stress Concerns  None Identified      Comments  -  Mykal has not voiced and stress concerns recently and is enjoying participation in phase 2 cardiac rehab      Interventions  Stress management education;Encouraged to attend  Cardiac Rehabilitation for the exercise  Encouraged to attend Cardiac Rehabilitation for the exercise      Continue Psychosocial Services   No Follow up required  No Follow up required        Initial Review   Source of Stress Concerns  Chronic Illness  Chronic Illness         Psychosocial Discharge (Final Psychosocial Re-Evaluation): Psychosocial Re-Evaluation - 05/03/18 1629      Psychosocial Re-Evaluation   Current issues with  None Identified    Comments  Earvin has not voiced and stress concerns recently and is enjoying participation in phase 2 cardiac rehab    Interventions  Encouraged to attend Cardiac Rehabilitation for the exercise    Continue Psychosocial Services   No Follow up required      Initial Review   Source of Stress Concerns  Chronic Illness  Vocational Rehabilitation: Provide vocational rehab assistance to qualifying candidates.   Vocational Rehab Evaluation & Intervention: Vocational Rehab - 03/22/18 1202      Initial Vocational Rehab Evaluation & Intervention   Assessment shows need for Vocational Rehabilitation  No       Education: Education Goals: Education classes will be provided on a weekly basis, covering required topics. Participant will state understanding/return demonstration of topics presented.  Learning Barriers/Preferences: Learning Barriers/Preferences - 03/22/18 1118      Learning Barriers/Preferences   Learning Barriers  Sight;Hearing    Learning Preferences  Pictoral;Video;Skilled Demonstration       Education Topics: Count Your Pulse:  -Group instruction provided by verbal instruction, demonstration, patient participation and written materials to support subject.  Instructors address importance of being able to find your pulse and how to count your pulse when at home without a heart monitor.  Patients get hands on experience counting their pulse with staff help and individually.   Heart Attack, Angina, and Risk Factor  Modification:  -Group instruction provided by verbal instruction, video, and written materials to support subject.  Instructors address signs and symptoms of angina and heart attacks.    Also discuss risk factors for heart disease and how to make changes to improve heart health risk factors.   Functional Fitness:  -Group instruction provided by verbal instruction, demonstration, patient participation, and written materials to support subject.  Instructors address safety measures for doing things around the house.  Discuss how to get up and down off the floor, how to pick things up properly, how to safely get out of a chair without assistance, and balance training.   CARDIAC REHAB PHASE II EXERCISE from 05/02/2018 in [place]  Date  04/27/18  Educator  EP  Instruction Review Code  2- Demonstrated Understanding      Meditation and Mindfulness:  -Group instruction provided by verbal instruction, patient participation, and written materials to support subject.  Instructor addresses importance of mindfulness and meditation practice to help reduce stress and improve awareness.  Instructor also leads participants through a meditation exercise.    Stretching for Flexibility and Mobility:  -Group instruction provided by verbal instruction, patient participation, and written materials to support subject.  Instructors lead participants through series of stretches that are designed to increase flexibility thus improving mobility.  These stretches are additional exercise for major muscle groups that are typically performed during regular warm up and cool down.   Hands Only CPR:  -Group verbal, video, and participation provides a basic overview of AHA guidelines for community CPR. Role-play of emergencies allow participants the opportunity to practice calling for help and chest compression technique with discussion of AED use.   Hypertension: -Group verbal and written  instruction that provides a basic overview of hypertension including the most recent diagnostic guidelines, risk factor reduction with self-care instructions and medication management.    Nutrition I class: Heart Healthy Eating:  -Group instruction provided by PowerPoint slides, verbal discussion, and written materials to support subject matter. The instructor gives an explanation and review of the Therapeutic Lifestyle Changes diet recommendations, which includes a discussion on lipid goals, dietary fat, sodium, fiber, plant stanol/sterol esters, sugar, and the components of a well-balanced, healthy diet.   Nutrition II class: Lifestyle Skills:  -Group instruction provided by PowerPoint slides, verbal discussion, and written materials to support subject matter. The instructor gives an explanation and review of label reading, grocery shopping for heart health, heart healthy recipe modifications,  and ways to make healthier choices when eating out.   Diabetes Question & Answer:  -Group instruction provided by PowerPoint slides, verbal discussion, and written materials to support subject matter. The instructor gives an explanation and review of diabetes co-morbidities, pre- and post-prandial blood glucose goals, pre-exercise blood glucose goals, signs, symptoms, and treatment of hypoglycemia and hyperglycemia, and foot care basics.   Diabetes Blitz:  -Group instruction provided by PowerPoint slides, verbal discussion, and written materials to support subject matter. The instructor gives an explanation and review of the physiology behind type 1 and type 2 diabetes, diabetes medications and rational behind using different medications, pre- and post-prandial blood glucose recommendations and Hemoglobin A1c goals, diabetes diet, and exercise including blood glucose guidelines for exercising safely.    Portion Distortion:  -Group instruction provided by PowerPoint slides, verbal discussion, written  materials, and food models to support subject matter. The instructor gives an explanation of serving size versus portion size, changes in portions sizes over the last 20 years, and what consists of a serving from each food group.   Stress Management:  -Group instruction provided by verbal instruction, video, and written materials to support subject matter.  Instructors review role of stress in heart disease and how to cope with stress positively.     Exercising on Your Own:  -Group instruction provided by verbal instruction, power point, and written materials to support subject.  Instructors discuss benefits of exercise, components of exercise, frequency and intensity of exercise, and end points for exercise.  Also discuss use of nitroglycerin and activating EMS.  Review options of places to exercise outside of rehab.  Review guidelines for sex with heart disease.   CARDIAC REHAB PHASE II EXERCISE from 05/02/2018 in [place]  Date  05/02/18  Instruction Review Code  2- Demonstrated Understanding      Cardiac Drugs I:  -Group instruction provided by verbal instruction and written materials to support subject.  Instructor reviews cardiac drug classes: antiplatelets, anticoagulants, beta blockers, and statins.  Instructor discusses reasons, side effects, and lifestyle considerations for each drug class.   Cardiac Drugs II:  -Group instruction provided by verbal instruction and written materials to support subject.  Instructor reviews cardiac drug classes: angiotensin converting enzyme inhibitors (ACE-I), angiotensin II receptor blockers (ARBs), nitrates, and calcium channel blockers.  Instructor discusses reasons, side effects, and lifestyle considerations for each drug class.   CARDIAC REHAB PHASE II EXERCISE from 05/02/2018 in [place]  Date  04/18/18  Instruction Review Code  2- Demonstrated Understanding      Anatomy and  Physiology of the Circulatory System:  Group verbal and written instruction and models provide basic cardiac anatomy and physiology, with the coronary electrical and arterial systems. Review of: AMI, Angina, Valve disease, Heart Failure, Peripheral Artery Disease, Cardiac Arrhythmia, Pacemakers, and the ICD.   Other Education:  -Group or individual verbal, written, or video instructions that support the educational goals of the cardiac rehab program.   Holiday Eating Survival Tips:  -Group instruction provided by PowerPoint slides, verbal discussion, and written materials to support subject matter. The instructor gives patients tips, tricks, and techniques to help them not only survive but enjoy the holidays despite the onslaught of food that accompanies the holidays.   Knowledge Questionnaire Score: Knowledge Questionnaire Score - 03/22/18 1118      Knowledge Questionnaire Score   Pre Score  19/24       Core Components/Risk Factors/Patient Goals at  Admission: Personal Goals and Risk Factors at Admission - 03/22/18 1118      Core Components/Risk Factors/Patient Goals on Admission    Weight Management  Yes;Weight Maintenance;Weight Loss    Intervention  Weight Management: Develop a combined nutrition and exercise program designed to reach desired caloric intake, while maintaining appropriate intake of nutrient and fiber, sodium and fats, and appropriate energy expenditure required for the weight goal.;Weight Management: Provide education and appropriate resources to help participant work on and attain dietary goals.;Weight Management/Obesity: Establish reasonable short term and long term weight goals.    Admit Weight  192 lb 0.3 oz (87.1 kg)    Expected Outcomes  Short Term: Continue to assess and modify interventions until short term weight is achieved;Long Term: Adherence to nutrition and physical activity/exercise program aimed toward attainment of established weight goal;Weight  Maintenance: Understanding of the daily nutrition guidelines, which includes 25-35% calories from fat, 7% or less cal from saturated fats, less than 200mg  cholesterol, less than 1.5gm of sodium, & 5 or more servings of fruits and vegetables daily;Weight Loss: Understanding of general recommendations for a balanced deficit meal plan, which promotes 1-2 lb weight loss per week and includes a negative energy balance of [phone] kcal/d;Understanding of distribution of calorie intake throughout the day with the consumption of 4-5 meals/snacks;Understanding recommendations for meals to include 15-35% energy as protein, 25-35% energy from fat, 35-60% energy from carbohydrates, less than 200mg  of dietary cholesterol, 20-35 gm of total fiber daily    Hypertension  Yes    Intervention  Provide education on lifestyle modifcations including regular physical activity/exercise, weight management, moderate sodium restriction and increased consumption of fresh fruit, vegetables, and low fat dairy, alcohol moderation, and smoking cessation.;Monitor prescription use compliance.    Expected Outcomes  Short Term: Continued assessment and intervention until BP is < 140/78mm HG in hypertensive participants. < 130/44mm HG in hypertensive participants with diabetes, heart failure or chronic kidney disease.;Long Term: Maintenance of blood pressure at goal levels.    Lipids  Yes    Intervention  Provide education and support for participant on nutrition & aerobic/resistive exercise along with prescribed medications to achieve LDL 70mg , HDL >40mg .    Expected Outcomes  Short Term: Participant states understanding of desired cholesterol values and is compliant with medications prescribed. Participant is following exercise prescription and nutrition guidelines.;Long Term: Cholesterol controlled with medications as prescribed, with individualized exercise RX and with personalized nutrition plan. Value goals: LDL < 70mg , HDL > 40 mg.     Stress  Yes    Intervention  Offer individual and/or small group education and counseling on adjustment to heart disease, stress management and health-related lifestyle change. Teach and support self-help strategies.;Refer participants experiencing significant psychosocial distress to appropriate mental health specialists for further evaluation and treatment. When possible, include family members and significant others in education/counseling sessions.    Expected Outcomes  Short Term: Participant demonstrates changes in health-related behavior, relaxation and other stress management skills, ability to obtain effective social support, and compliance with psychotropic medications if prescribed.;Long Term: Emotional wellbeing is indicated by absence of clinically significant psychosocial distress or social isolation.       Core Components/Risk Factors/Patient Goals Review:  Goals and Risk Factor Review    Row Name 04/12/18 1535 05/03/18 1631           Core Components/Risk Factors/Patient Goals Review   Personal Goals Review  Weight Management/Obesity;Hypertension;Stress;Lipids  Weight Management/Obesity;Hypertension;Stress;Lipids      Review  Sarthak's cardiac rehab has  been on hold post catherization. Jasiah is scheduled to resume exercise per Dr Johnsie Cancel on Monday  Ruger is doing well with exercise at cardiac rehab. Zein has not had any reports of angina. Khaleb's vital signs have been stable      Expected Outcomes  Patient will continue to participate in phase 2 cardiac rehab for exercise, nutrition and lifestyle modifications.  Patient will continue to participate in phase 2 cardiac rehab for exercise, nutrition and lifestyle modifications.         Core Components/Risk Factors/Patient Goals at Discharge (Final Review):  Goals and Risk Factor Review - 05/03/18 1631      Core Components/Risk Factors/Patient Goals Review   Personal Goals Review  Weight  Management/Obesity;Hypertension;Stress;Lipids    Review  Deangelo is doing well with exercise at cardiac rehab. Luther has not had any reports of angina. Anthone's vital signs have been stable    Expected Outcomes  Patient will continue to participate in phase 2 cardiac rehab for exercise, nutrition and lifestyle modifications.       ITP Comments: ITP Comments    Row Name 03/22/18 1158 04/12/18 1533 05/03/18 1628       ITP Comments  Dr. Fransico Him. Medical Director  30 Day ITP Review. Shavon's cardiac rehab will restart next week post cardiac catherization  30 Day ITP Review. Amery has good attendance and participation in phase 2 cardiac rehab        Comments: See ITP comments.Barnet Pall, RN,BSN 05/10/2018 10:33 AM

## 2018-05-04 ENCOUNTER — Ambulatory Visit (HOSPITAL_COMMUNITY): Payer: Medicare Other

## 2018-05-04 ENCOUNTER — Encounter (HOSPITAL_COMMUNITY)
Admission: RE | Admit: 2018-05-04 | Discharge: 2018-05-04 | Disposition: A | Discharge status: Home / Self Care | Payer: Medicare Other | Source: Ambulatory Visit | Attending: Cardiovascular Disease | Admitting: Cardiovascular Disease

## 2018-05-04 DIAGNOSIS — Z955 Presence of coronary angioplasty implant and graft: Secondary | ICD-10-CM | POA: Diagnosis not present

## 2018-05-07 ENCOUNTER — Ambulatory Visit (HOSPITAL_COMMUNITY): Payer: Medicare Other

## 2018-05-07 ENCOUNTER — Encounter (HOSPITAL_COMMUNITY)
Admission: RE | Admit: 2018-05-07 | Discharge: 2018-05-07 | Disposition: A | Discharge status: Home / Self Care | Payer: Medicare Other | Source: Ambulatory Visit | Attending: Cardiovascular Disease | Admitting: Cardiovascular Disease

## 2018-05-07 DIAGNOSIS — Z955 Presence of coronary angioplasty implant and graft: Secondary | ICD-10-CM | POA: Insufficient documentation

## 2018-05-08 ENCOUNTER — Encounter: Payer: Self-pay | Admitting: Family Medicine

## 2018-05-08 ENCOUNTER — Ambulatory Visit (INDEPENDENT_AMBULATORY_CARE_PROVIDER_SITE_OTHER): Payer: Medicare Other | Admitting: Family Medicine

## 2018-05-08 VITALS — BP 124/68 | HR 55 | Temp 98.2°F | Ht 66.5 in | Wt 193.1 lb

## 2018-05-08 DIAGNOSIS — I209 Angina pectoris, unspecified: Secondary | ICD-10-CM

## 2018-05-08 DIAGNOSIS — R1013 Epigastric pain: Secondary | ICD-10-CM | POA: Diagnosis not present

## 2018-05-08 DIAGNOSIS — I10 Essential (primary) hypertension: Secondary | ICD-10-CM | POA: Diagnosis not present

## 2018-05-08 DIAGNOSIS — E785 Hyperlipidemia, unspecified: Secondary | ICD-10-CM

## 2018-05-08 DIAGNOSIS — N289 Disorder of kidney and ureter, unspecified: Secondary | ICD-10-CM | POA: Diagnosis not present

## 2018-05-08 DIAGNOSIS — R739 Hyperglycemia, unspecified: Secondary | ICD-10-CM | POA: Diagnosis not present

## 2018-05-08 LAB — BASIC METABOLIC PANEL
BUN: 22 mg/dL (ref 6–23)
CO2: 26 mEq/L (ref 19–32)
Calcium: 9.7 mg/dL (ref 8.4–10.5)
Chloride: 109 mEq/L (ref 96–112)
Creatinine, Ser: 1.52 mg/dL — ABNORMAL HIGH (ref 0.40–1.50)
GFR: 45.5 mL/min — ABNORMAL LOW (ref >60.00)
GLUCOSE: 91 mg/dL (ref 70–99)
Potassium: 4.5 mEq/L (ref 3.5–5.1)
Sodium: 144 mEq/L (ref 135–145)

## 2018-05-08 LAB — LIPID PANEL
Cholesterol: 150 mg/dL (ref 0–200)
HDL: 44.4 mg/dL (ref >39.00)
LDL Cholesterol: 82 mg/dL (ref 0–99)
NonHDL: 105.28
Total CHOL/HDL Ratio: 3
Triglycerides: 117 mg/dL (ref 0.0–149.0)
VLDL: 23.4 mg/dL (ref 0.0–40.0)

## 2018-05-08 LAB — HEPATIC FUNCTION PANEL
ALT: 16 U/L (ref 0–53)
AST: 15 U/L (ref 0–37)
Albumin: 4.9 g/dL (ref 3.5–5.2)
Alkaline Phosphatase: 49 U/L (ref 39–117)
Bilirubin, Direct: 0.1 mg/dL (ref 0.0–0.3)
Total Bilirubin: 0.6 mg/dL (ref 0.2–1.2)
Total Protein: 6.9 g/dL (ref 6.0–8.3)

## 2018-05-08 LAB — HEMOGLOBIN A1C: Hgb A1c MFr Bld: 6 % (ref 4.6–6.5)

## 2018-05-08 MED ORDER — OMEPRAZOLE 40 MG PO CPDR: DELAYED_RELEASE_CAPSULE | ORAL | 3 refills | Status: DC

## 2018-05-08 MED ORDER — ISOSORBIDE MONONITRATE ER 30 MG PO TB24: 30.0000 mg | ORAL_TABLET | Freq: Every day | ORAL | 3 refills | Status: DC

## 2018-05-08 NOTE — Progress Notes (Signed)
   Subjective:    Patient ID: [name] Howells., male    DOB: Jun 09, 1945, 73 y.o.   MRN: [id number]  HPI Here to follow up on issues and to ask again about the chest pain and epigastric pain that he has had off and on for 7 months. He says it is often worse when he leans forward and then goes away when he leans back. It often hurts when he swims on his left side but goes away when he swims on his right side. No trouble swallowing. He is taking Omeprazole but this has not helped much. His BP is stable. No SOB. He is enjoying going to cardiac rehab.   Review of Systems  Constitutional: Negative.   Respiratory: Negative.   Cardiovascular: Positive for chest pain. Negative for palpitations and leg swelling.  Gastrointestinal: Negative.   Neurological: Negative.        Objective:   Physical Exam Constitutional:      Appearance: Normal appearance.  Cardiovascular:     Rate and Rhythm: Normal rate and regular rhythm.     Pulses: Normal pulses.     Heart sounds: Normal heart sounds.  Pulmonary:     Effort: Pulmonary effort is normal.     Breath sounds: Normal breath sounds.  Abdominal:     General: Abdomen is flat. Bowel sounds are normal. There is no distension.     Palpations: Abdomen is soft. There is no mass.     Tenderness: There is no abdominal tenderness. There is no guarding or rebound.     Hernia: No hernia is present.  Neurological:     Mental Status: He is alert.           Assessment & Plan:  His HTN and CAD are stable. We wil get fasting labs today to check lipids and an A1c. I spoke to him about the chest pain, and I think this could be explained by a hiatal hernia. We will refer him to [name] to consider an upper endoscopy to investigate this.  [name] Penna, MD

## 2018-05-09 ENCOUNTER — Ambulatory Visit (HOSPITAL_COMMUNITY): Payer: Medicare Other

## 2018-05-09 ENCOUNTER — Encounter: Payer: Self-pay | Admitting: *Deleted

## 2018-05-09 ENCOUNTER — Encounter (HOSPITAL_COMMUNITY)
Admission: RE | Admit: 2018-05-09 | Discharge: 2018-05-09 | Disposition: A | Discharge status: Home / Self Care | Payer: Medicare Other | Source: Ambulatory Visit | Attending: Cardiovascular Disease | Admitting: Cardiovascular Disease

## 2018-05-09 DIAGNOSIS — Z955 Presence of coronary angioplasty implant and graft: Secondary | ICD-10-CM

## 2018-05-11 ENCOUNTER — Ambulatory Visit (INDEPENDENT_AMBULATORY_CARE_PROVIDER_SITE_OTHER): Payer: Medicare Other

## 2018-05-11 ENCOUNTER — Telehealth: Payer: Self-pay

## 2018-05-11 ENCOUNTER — Encounter (HOSPITAL_COMMUNITY)
Admission: RE | Admit: 2018-05-11 | Discharge: 2018-05-11 | Disposition: A | Discharge status: Home / Self Care | Payer: Medicare Other | Source: Ambulatory Visit | Attending: Cardiovascular Disease | Admitting: Cardiovascular Disease

## 2018-05-11 ENCOUNTER — Ambulatory Visit (HOSPITAL_COMMUNITY): Payer: Medicare Other

## 2018-05-11 VITALS — BP 140/70 | HR 55 | Resp 16 | Ht 67.0 in | Wt 197.0 lb

## 2018-05-11 DIAGNOSIS — Z0000 Encounter for general adult medical examination without abnormal findings: Secondary | ICD-10-CM | POA: Diagnosis not present

## 2018-05-11 DIAGNOSIS — Z955 Presence of coronary angioplasty implant and graft: Secondary | ICD-10-CM | POA: Diagnosis not present

## 2018-05-11 NOTE — Telephone Encounter (Signed)
I agree, I deleted these from his chart

## 2018-05-11 NOTE — Telephone Encounter (Signed)
During awv, pt. requested hyperglycemia and gout dx to be removed from record, as "I had one high glucose reading at one point, but it's been fine since, and I don't have gout, but I did have a bunionectomy". Routed to [name] to reconcile.

## 2018-05-11 NOTE — Progress Notes (Signed)
Subjective:   [name] Schiff. is a 73 y.o. male who presents for Medicare Annual/Subsequent preventive examination.  Review of Systems:  No ROS.  Medicare Wellness Visit. Additional risk factors are reflected in the social history.  Cardiac Risk Factors include: advanced age (>15men, >34 women);hypertension;male gender;obesity (BMI >30kg/m2);dyslipidemia Sleep patterns: falls asleep easily and gets up 2 times nightly to void.    Home Safety/Smoke Alarms: Feels safe in home. Smoke alarms in place.  Living environment; residence and Firearm Safety: 1-story house/ trailer. Seat Belt Safety/Bike Helmet: Wears seat belt.     Male:   CCS- 07/2015, due 07/2025   PSA- sees urology  Lab Results  Component Value Date   PSA 0.38 11/06/2015   PSA 0.04 (L) 10/18/2011   PSA 0.01 (L) 09/30/2010       Objective:    Vitals: BP 140/70 (BP Location: Right Arm, Patient Position: Sitting, Cuff Size: Normal)   Pulse (!) 55   Resp 16   Ht 5\' 7"  (1.702 m)   Wt 197 lb (89.4 kg)   SpO2 97%   BMI 30.85 kg/m   Body mass index is 30.85 kg/m.  Advanced Directives 05/11/2018 04/06/2018 05/09/2017 05/05/2016 07/20/2015 07/06/2015 05/09/2014  Does Patient Have a Medical Advance Directive? No No No No No No No  Would patient like information on creating a medical advance directive? Yes (MAU/Ambulatory/Procedural Areas - Information given) No - Patient declined - - No - patient declined information - -    Tobacco Social History   Tobacco Use  Smoking Status Former Smoker  . Packs/day: 1.00  . Years: 9.00  . Pack years: 9.00  . Start date: 03/08/1963  . Last attempt to quit: 03/07/1972  . Years since quitting: 46.2  Smokeless Tobacco Never Used  Tobacco Comment   States AAA completed      Counseling given: Not Answered Comment: States AAA completed     Past Medical History:  Diagnosis Date  . ABNORMAL EKG 10/07/2008  . ADVEF, DRUG/MED/BIOL SUBST, OTHER DRUG NOS 09/26/2006  . Allergy    seasonal    . Cataract    left eye-surgery 07-2015  . DEGENERATIVE JOINT DISEASE, MODERATE 04/13/2007  . Diabetes mellitus    type 2, diet controlled   . DIVERTICULOSIS, COLON 04/13/2007  . ELEVATED PROSTATE SPECIFIC ANTIGEN 04/14/2007  . Glaucoma    sees [name]   . GOUT 05/16/2007  . HYPERLIPIDEMIA 09/26/2006  . HYPERTENSION 04/13/2007  . JOINT EFFUSION, KNEE 06/15/2009  . Melanoma [facility])    sees [name]   . PROSTATE CANCER 10/05/2009   sees [name]  . Renal insufficiency    RESOLVED PER PATIENT   Past Surgical History:  Procedure Laterality Date  . ACROMIOPLASTY Right 05-06-14   per [name]   . BUNIONECTOMY     bilateral  . CATARACT EXTRACTION Left    per [name]   . COLONOSCOPY  07/10/2015   per [name], benign polyp, repeat in 10 yrs   . CORONARY STENT INTERVENTION N/A 01/17/2018   Procedure: CORONARY STENT INTERVENTION;  Surgeon: [name], MD;  Location: [place] [facility];  Service: Cardiovascular;  Laterality: N/A;  . CYSTECTOMY     benign from hand  . GLAUCOMA SURGERY Bilateral 2014   . INSERTION OF MESH N/A 01/10/2013   Procedure: INSERTION OF MESH;  Surgeon: Earnstine Regal, MD;  Location: [place];  Service: General;  Laterality: N/A;  . KNEE ARTHROSCOPY Right Jan.  2013   right knee, per [name]   . KNEE ARTHROSCOPY Left 01-14-15   per [name]   . LEFT HEART CATH AND CORONARY ANGIOGRAPHY N/A 01/17/2018   Procedure: LEFT HEART CATH AND CORONARY ANGIOGRAPHY;  Surgeon: [name], MD;  Location: [place] [facility];  Service: Cardiovascular;  Laterality: N/A;  . LEFT HEART CATH AND CORONARY ANGIOGRAPHY N/A 04/06/2018   Procedure: LEFT HEART CATH AND CORONARY ANGIOGRAPHY;  Surgeon: [name], MD;  Location: [place] [facility];  Service: Cardiovascular;  Laterality: N/A;  . MELANOMA EXCISION  2012   per [name]  . PROSTATECTOMY     per [name]  . TONSILLECTOMY    . UMBILICAL HERNIA REPAIR  11/01/2011   Procedure: HERNIA REPAIR UMBILICAL ADULT;   Surgeon: Earnstine Regal, MD;  Location: [place];  Service: General;  Laterality: N/A;  Repair umbilical hernia with mesh patch  . VASECTOMY    . VENTRAL HERNIA REPAIR  01/10/2013   with mesh     [name]  . VENTRAL HERNIA REPAIR N/A 01/10/2013   Procedure: HERNIA REPAIR VENTRAL ADULT ;  Surgeon: Earnstine Regal, MD;  Location: [facility] OR;  Service: General;  Laterality: N/A;   Family History  Problem Relation Age of Onset  . Stroke Mother   . Heart failure Father   . Colon cancer Neg Hx   . Colon polyps Neg Hx   . Esophageal cancer Neg Hx   . Rectal cancer Neg Hx   . Stomach cancer Neg Hx    Social History   Socioeconomic History  . Marital status: Married    Spouse name: Not on file  . Number of children: 2  . Years of education: Not on file  . Highest education level: Not on file  Occupational History  . Occupation: Administrator, sports    Comment: retired  Scientific laboratory technician  . Financial resource strain: Not hard at all  . Food insecurity:    Worry: Never true    Inability: Never true  . Transportation needs:    Medical: No    Non-medical: No  Tobacco Use  . Smoking status: Former Smoker    Packs/day: 1.00    Years: 9.00    Pack years: 9.00    Start date: 03/08/1963    Last attempt to quit: 03/07/1972    Years since quitting: 46.2  . Smokeless tobacco: Never Used  . Tobacco comment: States AAA completed   Substance and Sexual Activity  . Alcohol use: Yes    Alcohol/week: 1.0 standard drinks    Types: 1 Cans of beer per week  . Drug use: No  . Sexual activity: Not on file  Lifestyle  . Physical activity:    Days per week: 3 days    Minutes per session: 60 min  . Stress: Not at all  Relationships  . Social connections:    Talks on phone: Three times a week    Gets together: Three times a week    Attends religious service: Not on file    Active member of club or organization: Yes    Attends meetings of clubs or organizations: More than 4 times per year     Relationship status: Married  Other Topics Concern  . Not on file  Social History Narrative   Was in the [employer]; Office manager   Currently can go to the [employer] orange exposure  Wife; married 41 years   2 children; grands no       05/11/2018:    Lives with wife on one level home   Enjoys going swimming at gym 3 days/week; currently enjoying cardiac rehab   Has one son, one daughter, and one granddaughter who all live in [place]. Travels to see them every other week.       Outpatient Encounter Medications as of 05/11/2018  Medication Sig  . aspirin EC 81 MG tablet Take 81 mg by mouth every evening.   . bimatoprost (LUMIGAN) 0.01 % SOLN Place 1 drop into both eyes at bedtime.  . clopidogrel (PLAVIX) 75 MG tablet Take 1 tablet (75 mg total) by mouth daily with breakfast.  . Coenzyme Q10 (COQ-10) 100 MG CAPS Take 100 mg by mouth every evening.   . dorzolamide-timolol (COSOPT) 22.3-6.8 MG/ML ophthalmic solution Place 1 drop into both eyes 2 (two) times daily.   . fenofibrate 160 MG tablet Take 1 tablet (160 mg total) by mouth daily. (Patient taking differently: Take 160 mg by mouth every evening. )  . isosorbide mononitrate (IMDUR) 30 MG 24 hr tablet Take 1 tablet (30 mg total) by mouth daily.  Marland Kitchen lisinopril (PRINIVIL,ZESTRIL) 20 MG tablet Take 1 tablet (20 mg total) by mouth daily.  . magnesium oxide (MAG-OX) 400 MG tablet Take 400 mg by mouth every evening.   . Multiple Vitamins-Minerals (MULTIVITAMIN WITH MINERALS) tablet Take 1 tablet by mouth 2 (two) times a week. Sundays and Wednesdays  . nitroGLYCERIN (NITROSTAT) 0.4 MG SL tablet Place 1 tablet (0.4 mg total) under the tongue every 5 (five) minutes as needed. (Patient taking differently: Place 0.4 mg under the tongue every 5 (five) minutes as needed for chest pain. )  . omeprazole (PRILOSEC) 40 MG capsule TAKE 1 CAPSULE BY MOUTH EVERY DAY  . rosuvastatin (CRESTOR) 20 MG tablet Take 20 mg by mouth every evening.    No  facility-administered encounter medications on file as of 05/11/2018.     Activities of Daily Living In your present state of health, do you have any difficulty performing the following activities: 05/11/2018  Hearing? Y  Vision? N  Difficulty concentrating or making decisions? N  Walking or climbing stairs? N  Dressing or bathing? N  Doing errands, shopping? N  Preparing Food and eating ? N  Using the Toilet? N  In the past six months, have you accidently leaked urine? Y  Comment sees urologist  Do you have problems with loss of bowel control? N  Managing your Medications? N  Managing your Finances? N  Housekeeping or managing your Housekeeping? N  Some recent data might be hidden    Patient Care Team: [name], MD as PCP - General Josue Hector, MD as PCP - Cardiology (Cardiology) Irine Seal, MD as Attending Physician (Urology) [facility]   Assessment:   This is a routine wellness examination for [name] [name]. Physical assessment deferred to PCP.   Exercise Activities and Dietary recommendations Current Exercise Habits: Structured exercise class, Type of exercise: walking;Other - see comments(swimming, diving), Time (Minutes): 60, Frequency (Times/Week): 3, Weekly Exercise (Minutes/Week): 180, Intensity: Moderate, Exercise limited by: cardiac condition(s) Diet (meal preparation, eat out, water intake, caffeinated beverages, dairy products, fruits and vegetables): in general, a "healthy" diet  . Focuses on eating low-carbohydrate diet, although recently has been eating more meat. Pt. admitted to not drinking enough water and education provided. Chief Strategy Officer provided heart-healthy diet suggestions in writing, and pt. appreciative.  Goals    . Patient Stated     Get endoscopy, and keep swimming and diving! Safe travels in April!    . Weight (lb) < 175 lb (79.4 kg)     Keep going swimming  Check out  online nutrition programs as GumSearch.nl and  http://vang.com/; fit61me; Look for foods with "whole" wheat; bran; oatmeal etc Shot at the farmer's markets in season for fresher choices  Watch for "hydrogenated" on the label of oils which are trans-fats.  Watch for "high fructose corn syrup" in snacks, yogurt or ketchup  Meats have less marbling; bright colored fruits and vegetables;  Canned; dump out liquid and wash vegetables. Be mindful of what we are eating  Portion control is essential to a health weight! Sit down; take a break and enjoy your meal; take smaller bites; put the fork down between bites;  It takes 20 minutes to get full; so check in with your fullness cues and stop eating when you start to fill full              Fall Risk Fall Risk  05/11/2018 03/22/2018 05/09/2017 05/09/2017 05/05/2016  Falls in the past year? 0 0 No No No  Number falls in past yr: - 0 - - -  Injury with Fall? - 0 - - -  Risk for fall due to : Impaired vision - - - -  Follow up Education provided;Falls prevention discussed Falls evaluation completed - - -    Depression Screen PHQ 2/9 Scores 05/11/2018 03/28/2018 05/09/2017 05/09/2017  PHQ - 2 Score 0 0 0 0  PHQ- 9 Score 0 - - -    Cognitive Function MMSE - Mini Mental State Exam 05/09/2017 05/05/2016  Not completed: (No Data) (No Data)   Ad8 score reviewed for issues:  Issues making decisions: no  Less interest in hobbies / activities: no  Repeats questions, stories (family complaining): no  Trouble using ordinary gadgets (microwave, computer, phone):no  Forgets the month or year: no  Mismanaging finances: no  Remembering appts: no  Daily problems with thinking and/or memory: no Ad8 score is= 0         Immunization History  Administered Date(s) Administered  . Influenza Split 04/14/2011  . Influenza Whole 02/18/2009  . Influenza, High Dose Seasonal PF 01/01/2013, 11/06/2014, 11/06/2015, 11/08/2016, 11/07/2017  . Influenza,inj,Quad PF,6+ Mos 10/29/2013  . Pneumococcal  Conjugate-13 06/26/2013  . Pneumococcal Polysaccharide-23 05/05/2015  . Td 05/10/2006  . Tdap 02/08/2018  . Zoster 06/13/2011    Qualifies for Shingles Vaccine? Yes, advised to receive at local pharmacy or [place].   Screening Tests Health Maintenance  Topic Date Due  . HEMOGLOBIN A1C  11/08/2018  . OPHTHALMOLOGY EXAM  03/08/2019  . COLONOSCOPY  07/19/2025  . TETANUS/TDAP  02/09/2028  . INFLUENZA VACCINE  Completed  . PNA vac Low Risk Adult  Completed  . FOOT EXAM  Discontinued       Plan:    Review the advance directive forms with your family and bring a copy of your living will and/or healthcare power of attorney to your next office visit.  Consider getting shingles vaccine (shingrix) at local pharmacy or [place] (may need to inquire about cost).  Will follow-up with [facility] about scheduling your endoscopy. See information on endoscopy provided.  Refer to audiology provider list.  Stay hydrated (at least 32oz-64oz non-caffeinated beverages) daily. Refer to diet tips provided as well :)  Good luck with cardiac rehab! It was wonderful meeting you!  I have personally reviewed and noted the following in the patient's chart:   . Medical and social history . Use of alcohol, tobacco or illicit drugs  . Current medications and supplements . Functional ability and status . Nutritional status . Physical activity . Advanced directives . List of other physicians . Hospitalizations, surgeries, and ER visits in previous 12 months . Vitals . Screenings to include cognitive, depression, and falls . Referrals and appointments  In addition, I have reviewed and discussed with patient certain preventive protocols, quality metrics, and best practice recommendations. A written personalized care plan for preventive services as well as general preventive health recommendations were provided to patient.     Alphia Moh, RN  05/11/2018

## 2018-05-11 NOTE — Patient Instructions (Addendum)
Review the advance directive forms with your family and bring a copy of your living will and/or healthcare power of attorney to your next office visit.  Consider getting shingles vaccine (shingrix) at local pharmacy or [facility] (may need to inquire about cost).  Will follow-up with [facility] about scheduling your endoscopy. See information on endoscopy provided.  Refer to audiology provider list.  Stay hydrated (at least 32oz-64oz non-caffeinated beverages) daily. Refer to diet tips provided as well :)  Good luck with cardiac rehab! It was wonderful meeting you!   [name] , Thank you for taking time to come for your Medicare Wellness Visit. I appreciate your ongoing commitment to your health goals. Please review the following plan we discussed and let me know if I can assist you in the future.   These are the goals we discussed: Goals    . Patient Stated     Get endoscopy, and keep swimming and diving! Safe travels in April!    . Weight (lb) < 175 lb (79.4 kg)     Keep going swimming  Check out  online nutrition programs as GumSearch.nl and http://vang.com/; fit24me; Look for foods with "whole" wheat; bran; oatmeal etc Shot at the farmer's markets in season for fresher choices  Watch for "hydrogenated" on the label of oils which are trans-fats.  Watch for "high fructose corn syrup" in snacks, yogurt or ketchup  Meats have less marbling; bright colored fruits and vegetables;  Canned; dump out liquid and wash vegetables. Be mindful of what we are eating  Portion control is essential to a health weight! Sit down; take a break and enjoy your meal; take smaller bites; put the fork down between bites;  It takes 20 minutes to get full; so check in with your fullness cues and stop eating when you start to fill full              This is a list of the screening recommended for you and due dates:  Health Maintenance  Topic Date Due  . Hemoglobin A1C  11/08/2018  . Eye exam for  diabetics  03/08/2019  . Colon Cancer Screening  07/19/2025  . Tetanus Vaccine  02/09/2028  . Flu Shot  Completed  . Pneumonia vaccines  Completed  . Complete foot exam   Discontinued     Upper Endoscopy, Adult Upper endoscopy is a procedure to look inside the upper GI (gastrointestinal) tract. The upper GI tract is made up of:  The part of the body that moves food from your mouth to your stomach (esophagus).  The stomach.  The first part of your small intestine (duodenum). This procedure is also called esophagogastroduodenoscopy (EGD) or gastroscopy. In this procedure, your health care provider passes a thin, flexible tube (endoscope) through your mouth and down your esophagus into your stomach. A small camera is attached to the end of the tube. Images from the camera appear on a monitor in the exam room. During this procedure, your health care provider may also remove a small piece of tissue to be sent to a lab and examined under a microscope (biopsy). Your health care provider may do an upper endoscopy to diagnose cancers of the upper GI tract. You may also have this procedure to find the cause of other conditions, such as:  Stomach pain.  Heartburn.  Pain or problems when swallowing.  Nausea and vomiting.  Stomach bleeding.  Stomach ulcers. Tell a health care provider about:  Any allergies you have.  All medicines you are taking, including vitamins, herbs, eye drops, creams, and over-the-counter medicines.  Any problems you or family members have had with anesthetic medicines.  Any blood disorders you have.  Any surgeries you have had.  Any medical conditions you have.  Whether you are pregnant or may be pregnant. What are the risks? Generally, this is a safe procedure. However, problems may occur, including:  Infection.  Bleeding.  Allergic reactions to medicines.  A tear or hole (perforation) in the esophagus, stomach, or duodenum. What happens before the  procedure? Staying hydrated Follow instructions from your health care provider about hydration, which may include:  Up to 2 hours before the procedure - you may continue to drink clear liquids, such as water, clear fruit juice, black coffee, and plain tea.  Eating and drinking restrictions Follow instructions from your health care provider about eating and drinking, which may include:  8 hours before the procedure - stop eating heavy meals or foods, such as meat, fried foods, or fatty foods.  6 hours before the procedure - stop eating light meals or foods, such as toast or cereal.  6 hours before the procedure - stop drinking milk or drinks that contain milk.  2 hours before the procedure - stop drinking clear liquids. Medicines Ask your health care provider about:  Changing or stopping your regular medicines. This is especially important if you are taking diabetes medicines or blood thinners.  Taking medicines such as aspirin and ibuprofen. These medicines can thin your blood. Do not take these medicines unless your health care provider tells you to take them.  Taking over-the-counter medicines, vitamins, herbs, and supplements. General instructions  Plan to have someone take you home from the hospital or clinic.  If you will be going home right after the procedure, plan to have someone with you for 24 hours.  Ask your health care provider what steps will be taken to help prevent infection. What happens during the procedure?   An IV will be inserted into one of your veins.  You may be given one or more of the following: ? A medicine to help you relax (sedative). ? A medicine to numb the throat (local anesthetic).  You will lie on your left side on an exam table.  Your health care provider will pass the endoscope through your mouth and down your esophagus.  Your health care provider will use the scope to check the inside of your esophagus, stomach, and duodenum. Biopsies  may be taken.  The endoscope will be removed. The procedure may vary among health care providers and hospitals. What happens after the procedure?  Your blood pressure, heart rate, breathing rate, and blood oxygen level will be monitored until you leave the hospital or clinic.  Do not drive for 24 hours if you were given a sedative during your procedure.  When your throat is no longer numb, you may be given some fluids to drink.  It is up to you to get the results of your procedure. Ask your health care provider, or the department that is doing the procedure, when your results will be ready. Summary  Upper endoscopy is a procedure to look inside the upper GI tract.  During the procedure, an IV will be inserted into one of your veins. You may be given a medicine to help you relax.  A medicine will be used to numb your throat.  The endoscope will be passed through your mouth and down your esophagus. This  information is not intended to replace advice given to you by your health care provider. Make sure you discuss any questions you have with your health care provider. Document Released: 02/19/2000 Document Revised: 07/24/2017 Document Reviewed: 07/24/2017 Elsevier Interactive Patient Education  2019 [place] Maintenance, Male A healthy lifestyle and preventive care is important for your health and wellness. Ask your health care provider about what schedule of regular examinations is right for you. What should I know about weight and diet? Eat a Healthy Diet  Eat plenty of vegetables, fruits, whole grains, low-fat dairy products, and lean protein.  Do not eat a lot of foods high in solid fats, added sugars, or salt.  Maintain a Healthy Weight Regular exercise can help you achieve or maintain a healthy weight. You should:  Do at least 150 minutes of exercise each week. The exercise should increase your heart rate and make you sweat (moderate-intensity exercise).  Do  strength-training exercises at least twice a week. Watch Your Levels of Cholesterol and Blood Lipids  Have your blood tested for lipids and cholesterol every 5 years starting at 71 years of age. If you are at high risk for heart disease, you should start having your blood tested when you are 72 years old. You may need to have your cholesterol levels checked more often if: ? Your lipid or cholesterol levels are high. ? You are older than 71 years of age. ? You are at high risk for heart disease. What should I know about cancer screening? Many types of cancers can be detected early and may often be prevented. Lung Cancer  You should be screened every year for lung cancer if: ? You are a current smoker who has smoked for at least 30 years. ? You are a former smoker who has quit within the past 15 years.  Talk to your health care provider about your screening options, when you should start screening, and how often you should be screened. Colorectal Cancer  Routine colorectal cancer screening usually begins at 71 years of age and should be repeated every 5-10 years until you are 71 years old. You may need to be screened more often if early forms of precancerous polyps or small growths are found. Your health care provider may recommend screening at an earlier age if you have risk factors for colon cancer.  Your health care provider may recommend using home test kits to check for hidden blood in the stool.  A small camera at the end of a tube can be used to examine your colon (sigmoidoscopy or colonoscopy). This checks for the earliest forms of colorectal cancer. Prostate and Testicular Cancer  Depending on your age and overall health, your health care provider may do certain tests to screen for prostate and testicular cancer.  Talk to your health care provider about any symptoms or concerns you have about testicular or prostate cancer. Skin Cancer  Check your skin from head to toe  regularly.  Tell your health care provider about any new moles or changes in moles, especially if: ? There is a change in a mole's size, shape, or color. ? You have a mole that is larger than a pencil eraser.  Always use sunscreen. Apply sunscreen liberally and repeat throughout the day.  Protect yourself by wearing long sleeves, pants, a wide-brimmed hat, and sunglasses when outside. What should I know about heart disease, diabetes, and high blood pressure?  If you are 81-63 years of age, have  your blood pressure checked every 3-5 years. If you are 19 years of age or older, have your blood pressure checked every year. You should have your blood pressure measured twice-once when you are at a hospital or clinic, and once when you are not at a hospital or clinic. Record the average of the two measurements. To check your blood pressure when you are not at a hospital or clinic, you can use: ? An automated blood pressure machine at a pharmacy. ? A home blood pressure monitor.  Talk to your health care provider about your target blood pressure.  If you are between 26-47 years old, ask your health care provider if you should take aspirin to prevent heart disease.  Have regular diabetes screenings by checking your fasting blood sugar level. ? If you are at a normal weight and have a low risk for diabetes, have this test once every three years after the age of 37. ? If you are overweight and have a high risk for diabetes, consider being tested at a younger age or more often.  A one-time screening for abdominal aortic aneurysm (AAA) by ultrasound is recommended for men aged 64-75 years who are current or former smokers. What should I know about preventing infection? Hepatitis B If you have a higher risk for hepatitis B, you should be screened for this virus. Talk with your health care provider to find out if you are at risk for hepatitis B infection. Hepatitis C Blood testing is recommended  for:  Everyone born from [birth year] through 1963.  Anyone with known risk factors for hepatitis C. Sexually Transmitted Diseases (STDs)  You should be screened each year for STDs including gonorrhea and chlamydia if: ? You are sexually active and are younger than 71 years of age. ? You are older than 71 years of age and your health care provider tells you that you are at risk for this type of infection. ? Your sexual activity has changed since you were last screened and you are at an increased risk for chlamydia or gonorrhea. Ask your health care provider if you are at risk.  Talk with your health care provider about whether you are at high risk of being infected with HIV. Your health care provider may recommend a prescription medicine to help prevent HIV infection. What else can I do?  Schedule regular health, dental, and eye exams.  Stay current with your vaccines (immunizations).  Do not use any tobacco products, such as cigarettes, chewing tobacco, and e-cigarettes. If you need help quitting, ask your health care provider.  Limit alcohol intake to no more than 2 drinks per day. One drink equals 12 ounces of beer, 5 ounces of wine, or 1 ounces of hard liquor.  Do not use street drugs.  Do not share needles.  Ask your health care provider for help if you need support or information about quitting drugs.  Tell your health care provider if you often feel depressed.  Tell your health care provider if you have ever been abused or do not feel safe at home. This information is not intended to replace advice given to you by your health care provider. Make sure you discuss any questions you have with your health care provider. Document Released: 08/20/2007 Document Revised: 10/21/2015 Document Reviewed: 11/25/2014 Elsevier Interactive Patient Education  2019 [employer].

## 2018-05-11 NOTE — Telephone Encounter (Signed)
Author phoned  GI per pt's request s/p awv to check on status of GI referral. Receptionist stated she would contact pt. This afternoon to arrange consultation as ordered by [name].

## 2018-05-14 ENCOUNTER — Ambulatory Visit (HOSPITAL_COMMUNITY): Payer: Medicare Other

## 2018-05-14 ENCOUNTER — Encounter (HOSPITAL_COMMUNITY)
Admission: RE | Admit: 2018-05-14 | Discharge: 2018-05-14 | Disposition: A | Discharge status: Home / Self Care | Payer: Medicare Other | Source: Ambulatory Visit | Attending: Cardiovascular Disease | Admitting: Cardiovascular Disease

## 2018-05-14 DIAGNOSIS — Z955 Presence of coronary angioplasty implant and graft: Secondary | ICD-10-CM

## 2018-05-16 ENCOUNTER — Other Ambulatory Visit: Payer: Self-pay

## 2018-05-16 ENCOUNTER — Ambulatory Visit (HOSPITAL_COMMUNITY): Payer: Medicare Other

## 2018-05-16 ENCOUNTER — Encounter (HOSPITAL_COMMUNITY)
Admission: RE | Admit: 2018-05-16 | Discharge: 2018-05-16 | Disposition: A | Discharge status: Home / Self Care | Payer: Medicare Other | Source: Ambulatory Visit | Attending: Cardiovascular Disease | Admitting: Cardiovascular Disease

## 2018-05-16 DIAGNOSIS — Z955 Presence of coronary angioplasty implant and graft: Secondary | ICD-10-CM | POA: Diagnosis not present

## 2018-05-18 ENCOUNTER — Ambulatory Visit (HOSPITAL_COMMUNITY): Payer: Medicare Other

## 2018-05-18 ENCOUNTER — Other Ambulatory Visit: Payer: Self-pay

## 2018-05-18 ENCOUNTER — Encounter (HOSPITAL_COMMUNITY)
Admission: RE | Admit: 2018-05-18 | Discharge: 2018-05-18 | Disposition: A | Discharge status: Home / Self Care | Payer: Medicare Other | Source: Ambulatory Visit | Attending: Cardiovascular Disease | Admitting: Cardiovascular Disease

## 2018-05-18 DIAGNOSIS — Z955 Presence of coronary angioplasty implant and graft: Secondary | ICD-10-CM | POA: Diagnosis not present

## 2018-05-21 ENCOUNTER — Encounter (HOSPITAL_COMMUNITY): Payer: Medicare Other

## 2018-05-21 ENCOUNTER — Ambulatory Visit (HOSPITAL_COMMUNITY): Payer: Medicare Other

## 2018-05-21 ENCOUNTER — Telehealth (HOSPITAL_COMMUNITY): Payer: Self-pay | Admitting: *Deleted

## 2018-05-23 ENCOUNTER — Encounter (HOSPITAL_COMMUNITY): Payer: Medicare Other

## 2018-05-23 ENCOUNTER — Ambulatory Visit (HOSPITAL_COMMUNITY): Payer: Medicare Other

## 2018-05-25 ENCOUNTER — Ambulatory Visit (HOSPITAL_COMMUNITY): Payer: Medicare Other

## 2018-05-25 ENCOUNTER — Encounter (HOSPITAL_COMMUNITY): Payer: Medicare Other

## 2018-05-25 ENCOUNTER — Encounter (HOSPITAL_COMMUNITY): Payer: Self-pay | Admitting: *Deleted

## 2018-05-25 DIAGNOSIS — Z955 Presence of coronary angioplasty implant and graft: Secondary | ICD-10-CM

## 2018-05-28 ENCOUNTER — Ambulatory Visit (HOSPITAL_COMMUNITY): Payer: Medicare Other

## 2018-05-28 ENCOUNTER — Encounter (HOSPITAL_COMMUNITY): Payer: Medicare Other

## 2018-05-30 ENCOUNTER — Encounter (HOSPITAL_COMMUNITY): Payer: Medicare Other

## 2018-05-30 ENCOUNTER — Encounter (HOSPITAL_COMMUNITY): Payer: Self-pay | Admitting: *Deleted

## 2018-05-30 ENCOUNTER — Ambulatory Visit (HOSPITAL_COMMUNITY): Payer: Medicare Other

## 2018-05-30 DIAGNOSIS — Z955 Presence of coronary angioplasty implant and graft: Secondary | ICD-10-CM

## 2018-05-30 NOTE — Progress Notes (Signed)
Cardiac Individual Treatment Plan  Patient Details  Name: [name] [name]. MRN: [id number] Date of Birth: 10/21/1945 Referring Provider:     CARDIAC REHAB PHASE II ORIENTATION from 03/22/2018 in [place]  Referring Provider  [name]      Initial Encounter Date:    CARDIAC REHAB PHASE II ORIENTATION from 03/22/2018 in [place]  Date  03/22/18      Visit Diagnosis: Status post coronary artery stent placement  Patient's Home Medications on Admission:  Current Outpatient Medications:  .  aspirin EC 81 MG tablet, Take 81 mg by mouth every evening. , Disp: , Rfl:  .  bimatoprost (LUMIGAN) 0.01 % SOLN, Place 1 drop into both eyes at bedtime., Disp: , Rfl:  .  clopidogrel (PLAVIX) 75 MG tablet, Take 1 tablet (75 mg total) by mouth daily with breakfast., Disp: 90 tablet, Rfl: 8 .  Coenzyme Q10 (COQ-10) 100 MG CAPS, Take 100 mg by mouth every evening. , Disp: , Rfl:  .  dorzolamide-timolol (COSOPT) 22.3-6.8 MG/ML ophthalmic solution, Place 1 drop into both eyes 2 (two) times daily. , Disp: , Rfl:  .  fenofibrate 160 MG tablet, Take 1 tablet (160 mg total) by mouth daily. (Patient taking differently: Take 160 mg by mouth every evening. ), Disp: 90 tablet, Rfl: 3 .  isosorbide mononitrate (IMDUR) 30 MG 24 hr tablet, Take 1 tablet (30 mg total) by mouth daily., Disp: 90 tablet, Rfl: 3 .  lisinopril (PRINIVIL,ZESTRIL) 20 MG tablet, Take 1 tablet (20 mg total) by mouth daily., Disp: 90 tablet, Rfl: 3 .  magnesium oxide (MAG-OX) 400 MG tablet, Take 400 mg by mouth every evening. , Disp: , Rfl:  .  Multiple Vitamins-Minerals (MULTIVITAMIN WITH MINERALS) tablet, Take 1 tablet by mouth 2 (two) times a week. Sundays and Wednesdays, Disp: , Rfl:  .  nitroGLYCERIN (NITROSTAT) 0.4 MG SL tablet, Place 1 tablet (0.4 mg total) under the tongue every 5 (five) minutes as needed. (Patient taking differently: Place 0.4 mg under the tongue every  5 (five) minutes as needed for chest pain. ), Disp: 25 tablet, Rfl: 3 .  omeprazole (PRILOSEC) 40 MG capsule, TAKE 1 CAPSULE BY MOUTH EVERY DAY, Disp: 90 capsule, Rfl: 3 .  rosuvastatin (CRESTOR) 20 MG tablet, Take 20 mg by mouth every evening. , Disp: , Rfl:   Past Medical History: Past Medical History:  Diagnosis Date  . ABNORMAL EKG 10/07/2008  . ADVEF, DRUG/MED/BIOL SUBST, OTHER DRUG NOS 09/26/2006  . Allergy    seasonal  . Cataract    left eye-surgery 07-2015  . DEGENERATIVE JOINT DISEASE, MODERATE 04/13/2007  . DIVERTICULOSIS, COLON 04/13/2007  . ELEVATED PROSTATE SPECIFIC ANTIGEN 04/14/2007  . Glaucoma    sees [name]   . HYPERLIPIDEMIA 09/26/2006  . HYPERTENSION 04/13/2007  . JOINT EFFUSION, KNEE 06/15/2009  . Melanoma [facility])    sees [name]   . PROSTATE CANCER 10/05/2009   sees [name]  . Renal insufficiency    RESOLVED PER PATIENT    Tobacco Use: Social History   Tobacco Use  Smoking Status Former Smoker  . Packs/day: 1.00  . Years: 9.00  . Pack years: 9.00  . Start date: 03/08/1963  . Last attempt to quit: 03/07/1972  . Years since quitting: 46.2  Smokeless Tobacco Never Used  Tobacco Comment   States AAA completed     Labs: Recent Review Scientist, physiological    Labs for ITP Cardiac  and Pulmonary Rehab Latest Ref Rng & Units 05/05/2016 11/08/2016 05/09/2017 11/07/2017 05/08/2018   Cholestrol 0 - 200 mg/dL 127 135 132 163 150   LDLCALC 0 - 99 mg/dL 64 67 71 87 82   LDLDIRECT mg/dL - - - - -   HDL >39.00 mg/dL 41.70 50.30 45.00 43.10 44.40   Trlycerides 0.0 - 149.0 mg/dL 107.0 88.0 80.0 165.0(H) 117.0   Hemoglobin A1c 4.6 - 6.5 % 6.0 5.7 5.9 6.0 6.0      Capillary Blood Glucose: Lab Results  Component Value Date   GLUCAP 93 04/06/2018   GLUCAP 91 03/28/2018   GLUCAP 140 (H) 03/28/2018   GLUCAP 94 11/01/2011     Exercise Target Goals: Exercise Program Goal: Individual exercise prescription set using results from initial 6 min walk test and THRR while considering   patient's activity barriers and safety.   Exercise Prescription Goal: Initial exercise prescription builds to 30-45 minutes a day of aerobic activity, 2-3 days per week.  Home exercise guidelines will be given to patient during program as part of exercise prescription that the participant will acknowledge.  Activity Barriers & Risk Stratification:   6 Minute Walk:   Oxygen Initial Assessment:   Oxygen Re-Evaluation:   Oxygen Discharge (Final Oxygen Re-Evaluation):   Initial Exercise Prescription:   Perform Capillary Blood Glucose checks as needed.  Exercise Prescription Changes: Exercise Prescription Changes    Row Name 04/16/18 1451 04/30/18 1450 05/14/18 1454 05/18/18 1459       Response to Exercise   Blood Pressure (Admit)  148/82  128/78  142/63  122/82    Blood Pressure (Exercise)  142/70  148/72  154/80  142/60    Blood Pressure (Exit)  120/72  112/68  130/70  124/70    Heart Rate (Admit)  97 bpm  66 bpm  84 bpm  64 bpm    Heart Rate (Exercise)  111 bpm  123 bpm  105 bpm  100 bpm    Heart Rate (Exit)  93 bpm  68 bpm  84 bpm  64 bpm    Rating of Perceived Exertion (Exercise)  12  12  12  12     Symptoms  -  -  -  none    Comments  Pt returned to exe after cath procedure. Tol well.  -  -  -    Duration  Progress to 30 minutes of  aerobic without signs/symptoms of physical distress  Progress to 30 minutes of  aerobic without signs/symptoms of physical distress  Progress to 30 minutes of  aerobic without signs/symptoms of physical distress  Progress to 30 minutes of  aerobic without signs/symptoms of physical distress    Intensity  THRR unchanged  THRR unchanged  THRR unchanged  THRR unchanged      Progression   Progression  Continue to progress workloads to maintain intensity without signs/symptoms of physical distress.  Continue to progress workloads to maintain intensity without signs/symptoms of physical distress.  Continue to progress workloads to maintain intensity  without signs/symptoms of physical distress.  Continue to progress workloads to maintain intensity without signs/symptoms of physical distress.    Average METs  3.2  4.7  4.6  4.6      Resistance Training   Training Prescription  Yes  Yes  Yes  Yes    Weight  4lbs  4lbs  4lbs  4lbs    Reps  10-15  10-15  10-15  10-15    Time  10  Minutes  10 Minutes  10 Minutes  10 Minutes      Interval Training   Interval Training  No  No  No  No      Treadmill   MPH  2.8 Dec [facility] back to 2.8/0. First day back after cath.  3  3.3  3.3    Grade  0  2  2  2     Minutes  10  10  10  10     METs  3.14  4.12  4.43  4.43      Bike   Level  2 SciFit bike  4 SciFit bike  4 SciFit bike  4 SciFit bike    Minutes  10  10  10  10     METs  -  6.4  5.4  5.2      NuStep   Level  3  5  5  5     SPM  85  85  85  85    Minutes  10  10  10  10     METs  3.2  3.6  4.1  4.2      Home Exercise Plan   Plans to continue exercise at  [employer] (comment) Swimming. 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  Forensic scientist (comment) Swimming. 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  Forensic scientist (comment) Swimming. 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  Forensic scientist (comment) Swimming. 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    Frequency  Add 4 additional days to program exercise sessions.  Add 4 additional days to program exercise sessions.  Add 4 additional days to program exercise sessions.  Add 4 additional days to program exercise sessions.    Initial Home Exercises Provided  -  -  -  04/25/18       Exercise Comments: Exercise Comments    Row Name 04/16/18 1512 04/25/18 1535 04/30/18 1507 05/14/18 1525     Exercise Comments  Reviewed METs and goals with patient.  Reviewed home exercise guidelines, METs, and goals with patient.  METs reviewed with patient.  Reviewed METs and goals with patient.       Exercise Goals and Review:   Exercise Goals Re-Evaluation : Exercise Goals Re-Evaluation    Row Name 04/16/18 1512 04/25/18 1535 05/14/18 1525  05/23/18 1409       Exercise Goal Re-Evaluation   Exercise Goals Review  Able to understand and use rate of perceived exertion (RPE) scale;Increase Physical Activity  Able to understand and use rate of perceived exertion (RPE) scale;Increase Physical Activity;Knowledge and understanding of Target Heart Rate Range (THRR);Understanding of Exercise Prescription;Able to check pulse independently;Increase Strength and Stamina  Able to understand and use rate of perceived exertion (RPE) scale;Increase Physical Activity;Knowledge and understanding of Target Heart Rate Range (THRR);Understanding of Exercise Prescription;Able to check pulse independently;Increase Strength and Stamina  -    Comments  Patient returned to exercise today after being absent due to cath. Pt tolerated exercise well. Pt is back to swimming 30-40 mins, 3 days/week and walking on TM at the gym 30 mins 2 days/week. No symptoms with exercise.  Reviewed home exercise guidelines with patient including THRR, RPE scale, and endpoints for exercise. Patient is swimming at least 30 minutes, 2 days/week. Pt walk treadmill at home if he is unable to swim. Pt attended the pulse counting class, and states he can count his pulse.  Patient states that he has some knee pain with exercise but it tends to feel fine once he gets going.  Temporary department closure due to COVID-19.    Expected Outcomes  Patient will continue daily exericse routine to help improve cardiorespiratory fitness.  Patient will exercise at least 30 minutes, at least 5 days/week to help achieve personal health and fitness goals.  Patient will continue current exercise routine, progressing workloads as tolerated.  -       Discharge Exercise Prescription (Final Exercise Prescription Changes): Exercise Prescription Changes - 05/18/18 1459      Response to Exercise   Blood Pressure (Admit)  122/82    Blood Pressure (Exercise)  142/60    Blood Pressure (Exit)  124/70    Heart Rate  (Admit)  64 bpm    Heart Rate (Exercise)  100 bpm    Heart Rate (Exit)  64 bpm    Rating of Perceived Exertion (Exercise)  12    Symptoms  none    Duration  Progress to 30 minutes of  aerobic without signs/symptoms of physical distress    Intensity  THRR unchanged      Progression   Progression  Continue to progress workloads to maintain intensity without signs/symptoms of physical distress.    Average METs  4.6      Resistance Training   Training Prescription  Yes    Weight  4lbs    Reps  10-15    Time  10 Minutes      Interval Training   Interval Training  No      Treadmill   MPH  3.3    Grade  2    Minutes  10    METs  4.43      Bike   Level  4   SciFit bike   Minutes  10    METs  5.2      NuStep   Level  5    SPM  85    Minutes  10    METs  4.2      Home Exercise Plan   Plans to continue exercise at  [employer] (comment)   Swimming. 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   Frequency  Add 4 additional days to program exercise sessions.    Initial Home Exercises Provided  04/25/18       Nutrition:  Target Goals: Understanding of nutrition guidelines, daily intake of sodium 1500mg , cholesterol 200mg , calories 30% from fat and 7% or less from saturated fats, daily to have 5 or more servings of fruits and vegetables.  Biometrics:    Nutrition Therapy Plan and Nutrition Goals:   Nutrition Assessments:   Nutrition Goals Re-Evaluation:   Nutrition Goals Re-Evaluation:   Nutrition Goals Discharge (Final Nutrition Goals Re-Evaluation):   Psychosocial: Target Goals: Acknowledge presence or absence of significant depression and/or stress, maximize coping skills, provide positive support system. Participant is able to verbalize types and ability to use techniques and skills needed for reducing stress and depression.  Initial Review & Psychosocial Screening:   Quality of Life Scores:  Scores of 19 and below usually indicate a poorer quality of life in  these areas.  A difference of  2-3 points is a clinically meaningful difference.  A difference of 2-3 points in the total score of the Quality of Life Index has been associated with significant improvement in overall quality of life, self-image, physical symptoms, and general health in studies assessing change in quality of life.  PHQ-9: Recent Review Flowsheet Data    Depression screen [facility] 2/9 05/11/2018 03/28/2018 05/09/2017 05/09/2017 05/05/2016   Decreased Interest  0 0 0 0 0   Down, Depressed, Hopeless 0 0 0 0 0   PHQ - 2 Score 0 0 0 0 0   Altered sleeping 0 - - - -   Tired, decreased energy 0 - - - -   Change in appetite 0 - - - -   Feeling bad or failure about yourself  0 - - - -   Trouble concentrating 0 - - - -   Moving slowly or fidgety/restless 0 - - - -   Suicidal thoughts 0 - - - -   PHQ-9 Score 0 - - - -     Interpretation of Total Score  Total Score Depression Severity:  1-4 = Minimal depression, 5-9 = Mild depression, 10-14 = Moderate depression, 15-19 = Moderately severe depression, 20-27 = Severe depression   Psychosocial Evaluation and Intervention:   Psychosocial Re-Evaluation: Psychosocial Re-Evaluation    Row Name 04/12/18 1534 05/03/18 1629 05/25/18 1403         Psychosocial Re-Evaluation   Current issues with  Current Stress Concerns  None Identified  None Identified     Comments  -  Harrel has not voiced and stress concerns recently and is enjoying participation in phase 2 cardiac rehab  unable to assess as exercise is currently on hold     Interventions  Stress management education;Encouraged to attend Cardiac Rehabilitation for the exercise  Encouraged to attend Cardiac Rehabilitation for the exercise  -     Continue Psychosocial Services   No Follow up required  No Follow up required  -       Initial Review   Source of Stress Concerns  Chronic Illness  Chronic Illness  -        Psychosocial Discharge (Final Psychosocial Re-Evaluation): Psychosocial  Re-Evaluation - 05/25/18 1403      Psychosocial Re-Evaluation   Current issues with  None Identified    Comments  unable to assess as exercise is currently on hold       Vocational Rehabilitation: Provide vocational rehab assistance to qualifying candidates.   Vocational Rehab Evaluation & Intervention:   Education: Education Goals: Education classes will be provided on a weekly basis, covering required topics. Participant will state understanding/return demonstration of topics presented.  Learning Barriers/Preferences:   Education Topics: Count Your Pulse:  -Group instruction provided by verbal instruction, demonstration, patient participation and written materials to support subject.  Instructors address importance of being able to find your pulse and how to count your pulse when at home without a heart monitor.  Patients get hands on experience counting their pulse with staff help and individually.   Heart Attack, Angina, and Risk Factor Modification:  -Group instruction provided by verbal instruction, video, and written materials to support subject.  Instructors address signs and symptoms of angina and heart attacks.    Also discuss risk factors for heart disease and how to make changes to improve heart health risk factors.   Functional Fitness:  -Group instruction provided by verbal instruction, demonstration, patient participation, and written materials to support subject.  Instructors address safety measures for doing things around the house.  Discuss how to get up and down off the floor, how to pick things up properly, how to safely get out of a chair without assistance, and balance training.   CARDIAC REHAB PHASE II EXERCISE from 05/18/2018 in [place]  Date  04/27/18  Educator  EP  Instruction Review Code  2- Demonstrated Understanding  Meditation and Mindfulness:  -Group instruction provided by verbal instruction, patient  participation, and written materials to support subject.  Instructor addresses importance of mindfulness and meditation practice to help reduce stress and improve awareness.  Instructor also leads participants through a meditation exercise.    CARDIAC REHAB PHASE II EXERCISE from 05/18/2018 in [place]  Date  05/09/18  Educator  Mable Fill  Instruction Review Code  2- Demonstrated Understanding      Stretching for Flexibility and Mobility:  -Group instruction provided by verbal instruction, patient participation, and written materials to support subject.  Instructors lead participants through series of stretches that are designed to increase flexibility thus improving mobility.  These stretches are additional exercise for major muscle groups that are typically performed during regular warm up and cool down.   Hands Only CPR:  -Group verbal, video, and participation provides a basic overview of AHA guidelines for community CPR. Role-play of emergencies allow participants the opportunity to practice calling for help and chest compression technique with discussion of AED use.   Hypertension: -Group verbal and written instruction that provides a basic overview of hypertension including the most recent diagnostic guidelines, risk factor reduction with self-care instructions and medication management.   CARDIAC REHAB PHASE II EXERCISE from 05/18/2018 in [place]  Date  05/18/18  Instruction Review Code  2- Demonstrated Understanding       Nutrition I class: Heart Healthy Eating:  -Group instruction provided by PowerPoint slides, verbal discussion, and written materials to support subject matter. The instructor gives an explanation and review of the Therapeutic Lifestyle Changes diet recommendations, which includes a discussion on lipid goals, dietary fat, sodium, fiber, plant stanol/sterol esters, sugar, and the components of a  well-balanced, healthy diet.   Nutrition II class: Lifestyle Skills:  -Group instruction provided by PowerPoint slides, verbal discussion, and written materials to support subject matter. The instructor gives an explanation and review of label reading, grocery shopping for heart health, heart healthy recipe modifications, and ways to make healthier choices when eating out.   Diabetes Question & Answer:  -Group instruction provided by PowerPoint slides, verbal discussion, and written materials to support subject matter. The instructor gives an explanation and review of diabetes co-morbidities, pre- and post-prandial blood glucose goals, pre-exercise blood glucose goals, signs, symptoms, and treatment of hypoglycemia and hyperglycemia, and foot care basics.   Diabetes Blitz:  -Group instruction provided by PowerPoint slides, verbal discussion, and written materials to support subject matter. The instructor gives an explanation and review of the physiology behind type 1 and type 2 diabetes, diabetes medications and rational behind using different medications, pre- and post-prandial blood glucose recommendations and Hemoglobin A1c goals, diabetes diet, and exercise including blood glucose guidelines for exercising safely.    Portion Distortion:  -Group instruction provided by PowerPoint slides, verbal discussion, written materials, and food models to support subject matter. The instructor gives an explanation of serving size versus portion size, changes in portions sizes over the last 20 years, and what consists of a serving from each food group.   Stress Management:  -Group instruction provided by verbal instruction, video, and written materials to support subject matter.  Instructors review role of stress in heart disease and how to cope with stress positively.     Exercising on Your Own:  -Group instruction provided by verbal instruction, power point, and written materials to support subject.   Instructors discuss benefits of exercise, components of exercise, frequency and  intensity of exercise, and end points for exercise.  Also discuss use of nitroglycerin and activating EMS.  Review options of places to exercise outside of rehab.  Review guidelines for sex with heart disease.   CARDIAC REHAB PHASE II EXERCISE from 05/18/2018 in [place]  Date  05/02/18  Instruction Review Code  2- Demonstrated Understanding      Cardiac Drugs I:  -Group instruction provided by verbal instruction and written materials to support subject.  Instructor reviews cardiac drug classes: antiplatelets, anticoagulants, beta blockers, and statins.  Instructor discusses reasons, side effects, and lifestyle considerations for each drug class.   Cardiac Drugs II:  -Group instruction provided by verbal instruction and written materials to support subject.  Instructor reviews cardiac drug classes: angiotensin converting enzyme inhibitors (ACE-I), angiotensin II receptor blockers (ARBs), nitrates, and calcium channel blockers.  Instructor discusses reasons, side effects, and lifestyle considerations for each drug class.   CARDIAC REHAB PHASE II EXERCISE from 05/18/2018 in [place]  Date  04/18/18  Instruction Review Code  2- Demonstrated Understanding      Anatomy and Physiology of the Circulatory System:  Group verbal and written instruction and models provide basic cardiac anatomy and physiology, with the coronary electrical and arterial systems. Review of: AMI, Angina, Valve disease, Heart Failure, Peripheral Artery Disease, Cardiac Arrhythmia, Pacemakers, and the ICD.   Other Education:  -Group or individual verbal, written, or video instructions that support the educational goals of the cardiac rehab program.   Holiday Eating Survival Tips:  -Group instruction provided by PowerPoint slides, verbal discussion, and written materials to  support subject matter. The instructor gives patients tips, tricks, and techniques to help them not only survive but enjoy the holidays despite the onslaught of food that accompanies the holidays.   Knowledge Questionnaire Score:   Core Components/Risk Factors/Patient Goals at Admission:   Core Components/Risk Factors/Patient Goals Review:  Goals and Risk Factor Review    Row Name 04/12/18 1535 05/03/18 1631 05/25/18 1404         Core Components/Risk Factors/Patient Goals Review   Personal Goals Review  Weight Management/Obesity;Hypertension;Stress;Lipids  Weight Management/Obesity;Hypertension;Stress;Lipids  Weight Management/Obesity;Hypertension;Stress;Lipids     Review  [place]'s cardiac rehab has been on hold post catherization. [place] is scheduled to resume exercise per Dr Johnsie Cancel on Monday  Mckade is doing well with exercise at cardiac rehab. Kimoni has not had any reports of angina. Hikaru's vital signs have been stable  Exercise is currently on hold as department is closed per reccomended guidelines from the [employer] to prevent the spread of COVID-19     Expected Outcomes  Patient will continue to participate in phase 2 cardiac rehab for exercise, nutrition and lifestyle modifications.  Patient will continue to participate in phase 2 cardiac rehab for exercise, nutrition and lifestyle modifications.  Patient will continue to participate in phase 2 cardiac rehab for exercise, nutrition and lifestyle modifications when exercise resumes        Core Components/Risk Factors/Patient Goals at Discharge (Final Review):  Goals and Risk Factor Review - 05/25/18 1404      Core Components/Risk Factors/Patient Goals Review   Personal Goals Review  Weight Management/Obesity;Hypertension;Stress;Lipids    Review  Exercise is currently on hold as department is closed per reccomended guidelines from the [employer] to prevent the spread of COVID-19    Expected Outcomes  Patient will  continue to participate in phase 2 cardiac rehab for exercise, nutrition and lifestyle  modifications when exercise resumes       ITP Comments: ITP Comments    Row Name 04/12/18 1533 05/03/18 1628 05/25/18 1402       ITP Comments  30 Day ITP Review. D[place]'s cardiac rehab will restart next week post cardiac catherization  30 Day ITP Review. Kacin has good attendance and participation in phase 2 cardiac rehab  30 Day ITP Review. Exercise is currently on hold as department is closed per reccomended guidelines from the [employer] to prevent the spread of COVID-19        Comments: See ITP comments.Barnet Pall, RN,BSN 05/30/2018 9:48 AM

## 2018-06-01 ENCOUNTER — Ambulatory Visit (HOSPITAL_COMMUNITY): Payer: Medicare Other

## 2018-06-01 ENCOUNTER — Encounter (HOSPITAL_COMMUNITY): Payer: Medicare Other

## 2018-06-04 ENCOUNTER — Ambulatory Visit (HOSPITAL_COMMUNITY): Payer: Medicare Other

## 2018-06-04 ENCOUNTER — Encounter (HOSPITAL_COMMUNITY): Payer: Medicare Other

## 2018-06-06 ENCOUNTER — Ambulatory Visit (HOSPITAL_COMMUNITY): Payer: Medicare Other

## 2018-06-06 ENCOUNTER — Encounter (HOSPITAL_COMMUNITY): Payer: Medicare Other

## 2018-06-08 ENCOUNTER — Ambulatory Visit (HOSPITAL_COMMUNITY): Payer: Medicare Other

## 2018-06-08 ENCOUNTER — Encounter (HOSPITAL_COMMUNITY): Payer: Medicare Other

## 2018-06-11 ENCOUNTER — Encounter (HOSPITAL_COMMUNITY): Payer: Medicare Other

## 2018-06-11 ENCOUNTER — Ambulatory Visit (HOSPITAL_COMMUNITY): Payer: Medicare Other

## 2018-06-13 ENCOUNTER — Ambulatory Visit (HOSPITAL_COMMUNITY): Payer: Medicare Other

## 2018-06-13 ENCOUNTER — Encounter (HOSPITAL_COMMUNITY): Payer: Medicare Other

## 2018-06-15 ENCOUNTER — Telehealth (HOSPITAL_COMMUNITY): Payer: Self-pay | Admitting: *Deleted

## 2018-06-15 ENCOUNTER — Ambulatory Visit (HOSPITAL_COMMUNITY): Payer: Medicare Other

## 2018-06-15 ENCOUNTER — Encounter (HOSPITAL_COMMUNITY): Payer: Medicare Other

## 2018-06-15 NOTE — Telephone Encounter (Signed)
Called to notify patient that the cardiac and pulmonary rehabilitation department remains closed at this time due to COVID-19 restrictions.  Pt verbalized understanding. Pt is walking some on his treadmill at home.  [name] Passer, MS, ACSM CEP 06/15/2018 [phone]

## 2018-06-18 ENCOUNTER — Ambulatory Visit (HOSPITAL_COMMUNITY): Payer: Medicare Other

## 2018-06-18 ENCOUNTER — Encounter (HOSPITAL_COMMUNITY): Payer: Medicare Other

## 2018-06-19 ENCOUNTER — Other Ambulatory Visit: Payer: Self-pay

## 2018-06-19 ENCOUNTER — Ambulatory Visit (INDEPENDENT_AMBULATORY_CARE_PROVIDER_SITE_OTHER): Payer: Medicare Other | Admitting: Gastroenterology

## 2018-06-19 ENCOUNTER — Encounter: Payer: Self-pay | Admitting: Gastroenterology

## 2018-06-19 VITALS — BP 142/72 | HR 64 | Ht 66.0 in | Wt 190.0 lb

## 2018-06-19 DIAGNOSIS — R0602 Shortness of breath: Secondary | ICD-10-CM | POA: Diagnosis not present

## 2018-06-19 DIAGNOSIS — I209 Angina pectoris, unspecified: Secondary | ICD-10-CM

## 2018-06-19 NOTE — Patient Instructions (Signed)
He will start pepcid 20mg  pills, one pill at bedtime nightly.  He will change the way you are taking your antiacid medicine (prilosec) so that you are taking it 20-30 minutes prior to a decent meal as that is the way the pill is designed to work most effectively.   Please return to see [name] in 6-7 weeks (virtual or in person)

## 2018-06-19 NOTE — Progress Notes (Signed)
This service was provided via virtual visit (audio and visual).  The patient was located at home.  I was located in my office.  The patient did consent to this virtual visit and is aware of possible charges through their insurance for this visit.  He is a new patient.  Nobody else participated in this virtual service however my certified medical assistant helped with follow-up recommendations.  Time spent on virtual visit: 45 min   HPI: This is a very pleasant 73 yo man   Biggest issue is SOB, even just walking to the mailbox. Also intermittent chest pressure.  Has been swimming for many years however last week he started having chest pressure and SOB.  Went to see Dr. Sharlene Motts who sent him to a cardiologist. Underwent cardiac testing, eventually angiogram, single vessel stented, put on plavix. Still having SOB and so underwent another angio and the exam was normal.  Can be sitting in his lounger and have the pressure.  He was put on prilosec and it significantly helped belching.  Prescription strenght, 40mg  before breakfast only by about 2 hours.  PFTs of lungs looked above normal.  He cannot swim very far without SOB. Anything physical causes SOB.  He has zero heartburn or indigestion and never has except for extreme situations with large meals.  He has 'attacks' while eating, feels food hang. These are very rare.  He has been gaining weight.  CBC January 2020 was completely normal Basic metabolic profile and hepatic function panel March 2020 were completely normal except for his baseline renal insufficiency, creatinine around 1.5. Hemoglobin A1c March 2020 was 6.0  CT scan of chest coronary morph with CTA does not show any evidence of hiatal hernia on my review of the images  Umbilical hernia with mesh, has broken down in the past  Prostate cancer because of agent orange.  S/p surgery and radiation.  Rising psa seeing Dr. Roni Bread next week probably.  Chief complaint is chest pressure  and shortness of breath  ROS: complete GI ROS as described in HPI, all other review negative.  Constitutional:  No unintentional weight loss   Past Medical History:  Diagnosis Date  . ABNORMAL EKG 10/07/2008  . ADVEF, DRUG/MED/BIOL SUBST, OTHER DRUG NOS 09/26/2006  . Allergy    seasonal  . Cataract    left eye-surgery 07-2015  . DEGENERATIVE JOINT DISEASE, MODERATE 04/13/2007  . DIVERTICULOSIS, COLON 04/13/2007  . ELEVATED PROSTATE SPECIFIC ANTIGEN 04/14/2007  . Glaucoma    sees [name]   . HYPERLIPIDEMIA 09/26/2006  . HYPERTENSION 04/13/2007  . JOINT EFFUSION, KNEE 06/15/2009  . Melanoma [facility])    sees [name]   . PROSTATE CANCER 10/05/2009   sees [name]  . Renal insufficiency    RESOLVED PER PATIENT    Past Surgical History:  Procedure Laterality Date  . ACROMIOPLASTY Right 05-06-14   per [name]   . BUNIONECTOMY     bilateral  . CATARACT EXTRACTION Left    per [name]   . COLONOSCOPY  07/10/2015   per [name], benign polyp, repeat in 10 yrs   . CORONARY STENT INTERVENTION N/A 01/17/2018   Procedure: CORONARY STENT INTERVENTION;  Surgeon: [name], MD;  Location: [place] CV [facility];  Service: Cardiovascular;  Laterality: N/A;  . CYSTECTOMY     benign from hand  . GLAUCOMA SURGERY Bilateral 2014   . INSERTION OF MESH N/A 01/10/2013   Procedure: INSERTION OF MESH;  Surgeon: Merlinda Frederick  Gerkin, MD;  Location: [place];  Service: General;  Laterality: N/A;  . KNEE ARTHROSCOPY Right Jan. 2013   right knee, per [name]   . KNEE ARTHROSCOPY Left 01-14-15   per [name]   . LEFT HEART CATH AND CORONARY ANGIOGRAPHY N/A 01/17/2018   Procedure: LEFT HEART CATH AND CORONARY ANGIOGRAPHY;  Surgeon: [name], MD;  Location: [place] CV [facility];  Service: Cardiovascular;  Laterality: N/A;  . LEFT HEART CATH AND CORONARY ANGIOGRAPHY N/A 04/06/2018   Procedure: LEFT HEART CATH AND CORONARY ANGIOGRAPHY;  Surgeon: [name], MD;  Location: [place] [facility];   Service: Cardiovascular;  Laterality: N/A;  . MELANOMA EXCISION  2012   per [name]  . PROSTATECTOMY     per [name]  . TONSILLECTOMY    . UMBILICAL HERNIA REPAIR  11/01/2011   Procedure: HERNIA REPAIR UMBILICAL ADULT;  Surgeon: Earnstine Regal, MD;  Location: [place];  Service: General;  Laterality: N/A;  Repair umbilical hernia with mesh patch  . VASECTOMY    . VENTRAL HERNIA REPAIR  01/10/2013   with mesh     [name]  . VENTRAL HERNIA REPAIR N/A 01/10/2013   Procedure: HERNIA REPAIR VENTRAL ADULT ;  Surgeon: Earnstine Regal, MD;  Location: [place];  Service: General;  Laterality: N/A;    Current Outpatient Medications  Medication Sig Dispense Refill  . aspirin EC 81 MG tablet Take 81 mg by mouth every evening.     . bimatoprost (LUMIGAN) 0.01 % SOLN Place 1 drop into both eyes at bedtime.    . clopidogrel (PLAVIX) 75 MG tablet Take 1 tablet (75 mg total) by mouth daily with breakfast. 90 tablet 8  . Coenzyme Q10 (COQ-10) 100 MG CAPS Take 100 mg by mouth every evening.     . dorzolamide-timolol (COSOPT) 22.3-6.8 MG/ML ophthalmic solution Place 1 drop into both eyes 2 (two) times daily.     . fenofibrate 160 MG tablet Take 1 tablet (160 mg total) by mouth daily. (Patient taking differently: Take 160 mg by mouth every evening. ) 90 tablet 3  . isosorbide mononitrate (IMDUR) 30 MG 24 hr tablet Take 1 tablet (30 mg total) by mouth daily. 90 tablet 3  . lisinopril (PRINIVIL,ZESTRIL) 20 MG tablet Take 1 tablet (20 mg total) by mouth daily. 90 tablet 3  . magnesium oxide (MAG-OX) 400 MG tablet Take 400 mg by mouth every evening.     . Multiple Vitamins-Minerals (MULTIVITAMIN WITH MINERALS) tablet Take 1 tablet by mouth 2 (two) times a week. Sundays and Wednesdays    . nitroGLYCERIN (NITROSTAT) 0.4 MG SL tablet Place 1 tablet (0.4 mg total) under the tongue every 5 (five) minutes as needed. (Patient taking differently: Place 0.4 mg under the tongue every 5 (five) minutes as  needed for chest pain. ) 25 tablet 3  . omeprazole (PRILOSEC) 40 MG capsule TAKE 1 CAPSULE BY MOUTH EVERY DAY 90 capsule 3  . rosuvastatin (CRESTOR) 20 MG tablet Take 20 mg by mouth every evening.      No current facility-administered medications for this visit.     Allergies as of 06/19/2018  . (No Known Allergies)    Family History  Problem Relation Age of Onset  . Stroke Mother   . Heart failure Father   . Colon cancer Neg Hx   . Colon polyps Neg Hx   . Esophageal cancer Neg Hx   . Rectal cancer Neg Hx   .  Stomach cancer Neg Hx     Social History   Socioeconomic History  . Marital status: Married    Spouse name: Not on file  . Number of children: 2  . Years of education: Not on file  . Highest education level: Not on file  Occupational History  . Occupation: Administrator, sports    Comment: retired  Scientific laboratory technician  . Financial resource strain: Not hard at all  . Food insecurity:    Worry: Never true    Inability: Never true  . Transportation needs:    Medical: No    Non-medical: No  Tobacco Use  . Smoking status: Former Smoker    Packs/day: 1.00    Years: 9.00    Pack years: 9.00    Start date: 03/08/1963    Last attempt to quit: 03/07/1972    Years since quitting: 46.3  . Smokeless tobacco: Never Used  . Tobacco comment: States AAA completed   Substance and Sexual Activity  . Alcohol use: Yes    Alcohol/week: 1.0 standard drinks    Types: 1 Cans of beer per week  . Drug use: No  . Sexual activity: Not on file  Lifestyle  . Physical activity:    Days per week: 3 days    Minutes per session: 60 min  . Stress: Not at all  Relationships  . Social connections:    Talks on phone: Three times a week    Gets together: Three times a week    Attends religious service: Not on file    Active member of club or organization: Yes    Attends meetings of clubs or organizations: More than 4 times per year    Relationship status: Married  . Intimate partner violence:     Fear of current or ex partner: No    Emotionally abused: No    Physically abused: No    Forced sexual activity: No  Other Topics Concern  . Not on file  Social History Narrative   Was in the [employer]; submarine and Scientist, clinical (histocompatibility and immunogenetics)   Currently can go to the [employer] exposure      Wife; married 40 years   2 children; grands no       05/11/2018:    Lives with wife on one level home   Enjoys going swimming at gym 3 days/week; currently enjoying cardiac rehab   Has one son, one daughter, and one granddaughter who all live in [place]. Travels to see them every other week.        Physical Exam: Unable to perform because this was a "telemed visit" due to current Covid-19 pandemic BP (!) 142/72   Pulse 64   Ht 5\' 6"  (1.676 m)   Wt 190 lb (86.2 kg)   BMI 30.67 kg/m    Assessment and plan: 73 y.o. male with chest pressure and shortness of breath  His #1 issue is exercise-induced shortness of breath.  He also has intermittent chest pressures.  He has rare indigestion and belching which has significantly improved since 2 months ago when he started taking Prilosec.  He has rare solid food only dysphagia and is gaining weight.  This is been going on for many years.  I think it is very unlikely that his biggest complaint, exercise-induced shortness of breath, is related to a GI issue.  Perhaps some of his chest pressure is related to uncontrolled reflux but given his response to proton pump inhibitor with belching  and indigestion of nearly complete response I think it is unlikely.  That being said it is easy for him to adjust his antiacid medicines.  He will start taking his Prilosec shortly before breakfast rather than 2 hours prior.  He will also start taking a H2 blocker at bedtime every night.  He is going to call his cardiologist to let them know that I think it is very unlikely that his exercise induced shortness of breath is related to acid issues or GI issues.  He does have mild very  rare nonprogressive solid food dysphasia and weight gain.  Under normal circumstances I would recommend upper endoscopic examination at his soonest convenience however given how rare his dysphasia is and the fact that he is gaining weight and also the fact that CT scan shows nothing wrong with his esophagus or stomach I think it is very reasonable to wait in our current COVID pandemic.  We will call him to arrange for an office visit again in 6 or 7 weeks from now, hopefully this will be in person.  Please see the "Patient Instructions" section for addition details about the plan.  Owens Loffler, MD [facility] 06/19/2018, 8:09 AM

## 2018-06-20 ENCOUNTER — Encounter (HOSPITAL_COMMUNITY): Payer: Medicare Other

## 2018-06-20 ENCOUNTER — Ambulatory Visit (HOSPITAL_COMMUNITY): Payer: Medicare Other

## 2018-06-21 ENCOUNTER — Telehealth: Payer: Self-pay

## 2018-06-21 DIAGNOSIS — C61 Malignant neoplasm of prostate: Secondary | ICD-10-CM | POA: Diagnosis not present

## 2018-06-21 DIAGNOSIS — R0600 Dyspnea, unspecified: Secondary | ICD-10-CM

## 2018-06-21 NOTE — Telephone Encounter (Signed)
-----   Message from Josue Hector, MD sent at 06/20/2018  2:42 PM EDT ----- Needs BMET, Hct/Hb  CXR and echo for recurrent dyspnea

## 2018-06-21 NOTE — Telephone Encounter (Signed)
Patient aware of lab work and testing that [name] wants done tomorrow. Will put patient on schedule for lab work. Will send message to echo scheduler. Will call patient tomorrow morning with phone number and address to imaging place.

## 2018-06-22 ENCOUNTER — Ambulatory Visit (HOSPITAL_COMMUNITY): Payer: Medicare Other | Attending: Internal Medicine

## 2018-06-22 ENCOUNTER — Ambulatory Visit (HOSPITAL_COMMUNITY): Payer: Medicare Other

## 2018-06-22 ENCOUNTER — Other Ambulatory Visit: Payer: Self-pay

## 2018-06-22 ENCOUNTER — Ambulatory Visit
Admission: RE | Admit: 2018-06-22 | Discharge: 2018-06-22 | Disposition: A | Discharge status: Home / Self Care | Payer: Medicare Other | Source: Ambulatory Visit | Attending: Cardiovascular Disease | Admitting: Cardiovascular Disease

## 2018-06-22 ENCOUNTER — Other Ambulatory Visit: Payer: Medicare Other | Admitting: *Deleted

## 2018-06-22 ENCOUNTER — Encounter (HOSPITAL_COMMUNITY): Payer: Medicare Other

## 2018-06-22 ENCOUNTER — Ambulatory Visit: Payer: Medicare Other | Admitting: Gastroenterology

## 2018-06-22 DIAGNOSIS — R0600 Dyspnea, unspecified: Secondary | ICD-10-CM | POA: Diagnosis not present

## 2018-06-22 DIAGNOSIS — R0609 Other forms of dyspnea: Secondary | ICD-10-CM | POA: Diagnosis not present

## 2018-06-22 LAB — BASIC METABOLIC PANEL
BUN/Creatinine Ratio: 12 (ref 10–24)
BUN: 18 mg/dL (ref 8–27)
CO2: 19 mmol/L — ABNORMAL LOW (ref 20–29)
Calcium: 9.2 mg/dL (ref 8.6–10.2)
Chloride: 106 mmol/L (ref 96–106)
Creatinine, Ser: 1.5 mg/dL — ABNORMAL HIGH (ref 0.76–1.27)
GFR calc Af Amer: 54 mL/min/{1.73_m2} — ABNORMAL LOW (ref >59)
GFR calc non Af Amer: 46 mL/min/{1.73_m2} — ABNORMAL LOW (ref >59)
Glucose: 105 mg/dL — ABNORMAL HIGH (ref 65–99)
Potassium: 4.3 mmol/L (ref 3.5–5.2)
Sodium: 145 mmol/L — ABNORMAL HIGH (ref 134–144)

## 2018-06-22 LAB — HEMOGLOBIN: Hemoglobin: 14.6 g/dL (ref 13.0–17.7)

## 2018-06-22 LAB — HEMATOCRIT: Hematocrit: 44.4 % (ref 37.5–51.0)

## 2018-06-25 ENCOUNTER — Encounter (HOSPITAL_COMMUNITY): Payer: Medicare Other

## 2018-06-25 ENCOUNTER — Ambulatory Visit (HOSPITAL_COMMUNITY): Payer: Medicare Other

## 2018-06-27 ENCOUNTER — Ambulatory Visit (HOSPITAL_COMMUNITY): Payer: Medicare Other

## 2018-06-27 ENCOUNTER — Encounter (HOSPITAL_COMMUNITY): Payer: Medicare Other

## 2018-06-27 DIAGNOSIS — N5231 Erectile dysfunction following radical prostatectomy: Secondary | ICD-10-CM | POA: Diagnosis not present

## 2018-06-27 DIAGNOSIS — C61 Malignant neoplasm of prostate: Secondary | ICD-10-CM | POA: Diagnosis not present

## 2018-06-27 DIAGNOSIS — N393 Stress incontinence (female) (male): Secondary | ICD-10-CM | POA: Diagnosis not present

## 2018-06-29 ENCOUNTER — Encounter (HOSPITAL_COMMUNITY): Payer: Medicare Other

## 2018-06-29 ENCOUNTER — Ambulatory Visit (HOSPITAL_COMMUNITY): Payer: Medicare Other

## 2018-07-31 ENCOUNTER — Encounter: Payer: Self-pay | Admitting: General Surgery

## 2018-08-01 ENCOUNTER — Ambulatory Visit (INDEPENDENT_AMBULATORY_CARE_PROVIDER_SITE_OTHER): Payer: Medicare Other | Admitting: Gastroenterology

## 2018-08-01 ENCOUNTER — Encounter: Payer: Self-pay | Admitting: Gastroenterology

## 2018-08-01 ENCOUNTER — Other Ambulatory Visit: Payer: Self-pay

## 2018-08-01 ENCOUNTER — Telehealth (HOSPITAL_COMMUNITY): Payer: Self-pay | Admitting: *Deleted

## 2018-08-01 VITALS — Wt 190.0 lb

## 2018-08-01 DIAGNOSIS — R0602 Shortness of breath: Secondary | ICD-10-CM

## 2018-08-01 DIAGNOSIS — R142 Eructation: Secondary | ICD-10-CM | POA: Diagnosis not present

## 2018-08-01 DIAGNOSIS — I209 Angina pectoris, unspecified: Secondary | ICD-10-CM | POA: Diagnosis not present

## 2018-08-01 NOTE — Patient Instructions (Addendum)
   We will help arrange for new patient visit with pulmonology for shortness of breath.  Appointment has been made to see Dr Melvyn Novas at [facility] on 08/30/18 at 10:30am   Thank you for entrusting me with your care and choosing [facility].  Dr Ardis Hughs

## 2018-08-01 NOTE — Progress Notes (Signed)
This service was provided via virtual visit.  Only audio was used.  The patient was located at home.  I was located in my office.  The patient did consent to this virtual visit and is aware of possible charges through their insurance for this visit.  The patient is an established patient.  My certified medical assistant, Grace Bushy, contributed to this visit by contacting the patient by phone 1 or 2 business days prior to the appointment and also followed up on the recommendations I made after the visit.  Time spent on virtual visit: 20 minutes   HPI: This is a very pleasant whom I last saw him about a month and a half ago for a telemedicine visit his biggest issues and was exercise-induced shortness of breath which I did not think was related to a GI problem.  He was not anemic at all I recommended that he call his cardiologist to discuss it further.  He did have some mild very nonprogressive solid food dysphasia and weight gain.  Under normal circumstances I would recommend an upper endoscopic examination however given the infrequency of his dysphasia, the fact that he was gaining weight and also the fact that a CT scan recently did show nothing wrong with his esophagus or stomach I thought it was reasonable to wait given the pandemic restrictions.  He was not having any heartburn.However he was having some belching discomforts and so I modified his proton pump inhibitor that he was already taking so that he would dose it shortly before his breakfast meal and he also started a H2 blocker at bedtime every night  He did reach out to his cardiologist and he underwent an echocardiogram 06/22/2018 and it was normal  The belching has completely resolved.  No dysphagia.   He still has SOB with lifting things.  But no dyspnea on exertion with walking anymore  Chief complaint is belching, shortness of breath  ROS: complete GI ROS as described in HPI, all other review negative.  Constitutional:  No  unintentional weight loss   Past Medical History:  Diagnosis Date  . ABNORMAL EKG 10/07/2008  . ADVEF, DRUG/MED/BIOL SUBST, OTHER DRUG NOS 09/26/2006  . Allergy    seasonal  . Cataract    left eye-surgery 07-2015  . DEGENERATIVE JOINT DISEASE, MODERATE 04/13/2007  . DIVERTICULOSIS, COLON 04/13/2007  . ELEVATED PROSTATE SPECIFIC ANTIGEN 04/14/2007  . Glaucoma    sees [name]   . HYPERLIPIDEMIA 09/26/2006  . HYPERTENSION 04/13/2007  . JOINT EFFUSION, KNEE 06/15/2009  . Melanoma [facility])    sees [name]   . PROSTATE CANCER 10/05/2009   sees [name]  . Renal insufficiency    RESOLVED PER PATIENT    Past Surgical History:  Procedure Laterality Date  . ACROMIOPLASTY Right 05-06-14   per [name]   . BUNIONECTOMY     bilateral  . CATARACT EXTRACTION Left    per [name]   . COLONOSCOPY  07/10/2015   per [name], benign polyp, repeat in 10 yrs   . CORONARY STENT INTERVENTION N/A 01/17/2018   Procedure: CORONARY STENT INTERVENTION;  Surgeon: [name], MD;  Location: [place] [facility];  Service: Cardiovascular;  Laterality: N/A;  . CYSTECTOMY     benign from hand  . GLAUCOMA SURGERY Bilateral 2014   . heart stent    . INSERTION OF MESH N/A 01/10/2013   Procedure: INSERTION OF MESH;  Surgeon: Earnstine Regal, MD;  Location: [place];  Service: General;  Laterality: N/A;  . KNEE ARTHROSCOPY Right Jan. 2013   right knee, per [name]   . KNEE ARTHROSCOPY Left 01-14-15   per [name]   . LEFT HEART CATH AND CORONARY ANGIOGRAPHY N/A 01/17/2018   Procedure: LEFT HEART CATH AND CORONARY ANGIOGRAPHY;  Surgeon: [name], MD;  Location: [place] CV LAB;  Service: Cardiovascular;  Laterality: N/A;  . LEFT HEART CATH AND CORONARY ANGIOGRAPHY N/A 04/06/2018   Procedure: LEFT HEART CATH AND CORONARY ANGIOGRAPHY;  Surgeon: [name], MD;  Location: [place] [facility];  Service: Cardiovascular;  Laterality: N/A;  . MELANOMA EXCISION  2012   per [name]  .  PROSTATECTOMY     per [name]  . TONSILLECTOMY    . UMBILICAL HERNIA REPAIR  11/01/2011   Procedure: HERNIA REPAIR UMBILICAL ADULT;  Surgeon: Earnstine Regal, MD;  Location: [place];  Service: General;  Laterality: N/A;  Repair umbilical hernia with mesh patch  . VASECTOMY    . VENTRAL HERNIA REPAIR  01/10/2013   with mesh     [name]  . VENTRAL HERNIA REPAIR N/A 01/10/2013   Procedure: HERNIA REPAIR VENTRAL ADULT ;  Surgeon: Earnstine Regal, MD;  Location: [place];  Service: General;  Laterality: N/A;    Current Outpatient Medications  Medication Sig Dispense Refill  . aspirin EC 81 MG tablet Take 81 mg by mouth every evening.     . bimatoprost (LUMIGAN) 0.01 % SOLN Place 1 drop into both eyes at bedtime.    . clopidogrel (PLAVIX) 75 MG tablet Take 1 tablet (75 mg total) by mouth daily with breakfast. 90 tablet 8  . Coenzyme Q10 (COQ-10) 100 MG CAPS Take 100 mg by mouth every evening.     . dorzolamide-timolol (COSOPT) 22.3-6.8 MG/ML ophthalmic solution Place 1 drop into both eyes 2 (two) times daily.     . famotidine (PEPCID) 20 MG tablet Take 20 mg by mouth 2 (two) times daily.    . fenofibrate 160 MG tablet Take 1 tablet (160 mg total) by mouth daily. (Patient taking differently: Take 160 mg by mouth every evening. ) 90 tablet 3  . isosorbide mononitrate (IMDUR) 30 MG 24 hr tablet Take 1 tablet (30 mg total) by mouth daily. 90 tablet 3  . lisinopril (PRINIVIL,ZESTRIL) 20 MG tablet Take 1 tablet (20 mg total) by mouth daily. 90 tablet 3  . magnesium oxide (MAG-OX) 400 MG tablet Take 400 mg by mouth every evening.     . Multiple Vitamins-Minerals (MULTIVITAMIN WITH MINERALS) tablet Take 1 tablet by mouth 2 (two) times a week. Sundays and Wednesdays    . nitroGLYCERIN (NITROSTAT) 0.4 MG SL tablet Place 1 tablet (0.4 mg total) under the tongue every 5 (five) minutes as needed. (Patient taking differently: Place 0.4 mg under the tongue every 5 (five) minutes as needed for chest  pain. ) 25 tablet 3  . omeprazole (PRILOSEC) 40 MG capsule TAKE 1 CAPSULE BY MOUTH EVERY DAY 90 capsule 3  . rosuvastatin (CRESTOR) 20 MG tablet Take 20 mg by mouth every evening.      No current facility-administered medications for this visit.     Allergies as of 08/01/2018  . (No Known Allergies)    Family History  Problem Relation Age of Onset  . Stroke Mother   . Heart failure Father   . Colon cancer Neg Hx   . Colon polyps Neg Hx   .  Esophageal cancer Neg Hx   . Rectal cancer Neg Hx   . Stomach cancer Neg Hx     Social History   Socioeconomic History  . Marital status: Married    Spouse name: Charlett Nose  . Number of children: 2  . Years of education: Not on file  . Highest education level: Not on file  Occupational History  . Occupation: Administrator, sports    Comment: retired  Scientific laboratory technician  . Financial resource strain: Not hard at all  . Food insecurity:    Worry: Never true    Inability: Never true  . Transportation needs:    Medical: No    Non-medical: No  Tobacco Use  . Smoking status: Former Smoker    Packs/day: 1.00    Years: 9.00    Pack years: 9.00    Start date: 03/08/1963    Last attempt to quit: 03/07/1972    Years since quitting: 46.4  . Smokeless tobacco: Never Used  . Tobacco comment: States AAA completed   Substance and Sexual Activity  . Alcohol use: Yes    Alcohol/week: 1.0 standard drinks    Types: 1 Cans of beer per week  . Drug use: No  . Sexual activity: Not on file  Lifestyle  . Physical activity:    Days per week: 3 days    Minutes per session: 60 min  . Stress: Not at all  Relationships  . Social connections:    Talks on phone: Three times a week    Gets together: Three times a week    Attends religious service: Not on file    Active member of club or organization: Yes    Attends meetings of clubs or organizations: More than 4 times per year    Relationship status: Married  . Intimate partner violence:    Fear of current or ex  partner: No    Emotionally abused: No    Physically abused: No    Forced sexual activity: No  Other Topics Concern  . Not on file  Social History Narrative   Was in the [employer]; submarine and Scientist, clinical (histocompatibility and immunogenetics)   Currently can go to the [employer] exposure      Wife; married 40 years   2 children; grands no       05/11/2018:    Lives with wife on one level home   Enjoys going swimming at gym 3 days/week; currently enjoying cardiac rehab   Has one son, one daughter, and one granddaughter who all live in [place]. Travels to see them every other week.        Physical Exam: Unable to perform because this was a "telemed visit" due to current Covid-19 pandemic  Assessment and plan: 73 y.o. male with belching, shortness of breath  First his belching is significantly better since he change the way he was taking his proton pump inhibitor and added H2 blocker at night bedtime.  He is still concerned about his intermittent shortness of breath.  He says it does not happen when he walks as much but when he lifts heavy things he can feel short of breath.  He did follow-up with his cardiologist and underwent another echo cardiogram and it was normal.  His blood counts are normal and I have no reason to think that he has a GI process that would be causing his intermittent shortness of breath.  I recommended referral and evaluation by a pulmonologist.  Perhaps he has  parenchymal, pulmonary fibrosis type process that has eluded diagnosis?    Please see the "Patient Instructions" section for addition details about the plan.  Owens Loffler, MD [facility] 08/01/2018, 2:26 PM

## 2018-08-07 DIAGNOSIS — M25561 Pain in right knee: Secondary | ICD-10-CM | POA: Diagnosis not present

## 2018-08-07 DIAGNOSIS — M25562 Pain in left knee: Secondary | ICD-10-CM | POA: Diagnosis not present

## 2018-08-08 ENCOUNTER — Encounter: Payer: Self-pay | Admitting: Internal Medicine

## 2018-08-08 ENCOUNTER — Other Ambulatory Visit: Payer: Self-pay

## 2018-08-08 ENCOUNTER — Ambulatory Visit (INDEPENDENT_AMBULATORY_CARE_PROVIDER_SITE_OTHER): Payer: Medicare Other | Admitting: Internal Medicine

## 2018-08-08 DIAGNOSIS — I209 Angina pectoris, unspecified: Secondary | ICD-10-CM | POA: Diagnosis not present

## 2018-08-08 DIAGNOSIS — R0609 Other forms of dyspnea: Secondary | ICD-10-CM | POA: Insufficient documentation

## 2018-08-08 DIAGNOSIS — I10 Essential (primary) hypertension: Secondary | ICD-10-CM | POA: Diagnosis not present

## 2018-08-08 MED ORDER — TELMISARTAN 80 MG PO TABS: 80.0000 mg | ORAL_TABLET | Freq: Every day | ORAL | 2 refills | Status: DC

## 2018-08-08 MED ORDER — TELMISARTAN 80 MG PO TABS: 80.0000 mg | ORAL_TABLET | Freq: Every day | ORAL | 11 refills | Status: DC

## 2018-08-08 NOTE — Patient Instructions (Addendum)
Continue prilosec and pepcid as you are   GERD (REFLUX)  is an extremely common cause of respiratory symptoms just like yours , many times with no obvious heartburn at all.    It can be treated with medication, but also with lifestyle changes including elevation of the head of your bed (ideally with 6 -8inch blocks under the headboard of your bed),  Smoking cessation, avoidance of late meals, excessive alcohol, and avoid fatty foods, chocolate, peppermint, colas, red wine, and acidic juices such as orange juice.  NO MINT OR MENTHOL PRODUCTS SO NO COUGH DROPS  USE SUGARLESS CANDY INSTEAD (Jolley ranchers or Stover's or Life Savers) or even ice chips will also do - the key is to swallow to prevent all throat clearing. NO OIL BASED VITAMINS - use powdered substitutes.  Avoid fish oil when coughing.   Stop lisinopril and start micardis 80 mg (telmisartan) one daily    Please schedule a follow up office visit in 6 weeks, call sooner if needed

## 2018-08-08 NOTE — Progress Notes (Signed)
[name] Howells., male    DOB: 1945-06-12     MRN: [id number]   Brief patient profile:  72 yowm quit smoking 1974 with new onset doe/hoarseness  assoc chest tightness  starting in summer 2019  > cardiac eval > stent Jan 04 2018 > resumed same activity = swimming > same symptoms > then also began having similar sitting in [place] with dysphagia so  eval by Ardis Hughs in feb 2020 rec acid suppression which helped some of the pressure and all the belching o/w no better ex tol so referred to pulmonary clinic 08/08/2018 by Dr  Johnsie Cancel      History of Present Illness  08/08/2018  Pulmonary/ 1st office eval/Tanvir Hipple  Chief Complaint  Patient presents with  . Pulmonary Consult    Referred by Jenkins Rouge for DOE.   Dyspnea:  slt hill to mb x 200 ft gets chest tight and sob most times/ same problem with steps  Cough:  Day > noct  Dry assoc hoarseness  Sleep: prone /flat bed /one skinny pillow SABA use: none ppi helped belching but not cp,sob   No obvious day to day or daytime variability or assoc excess/ purulent sputum or mucus plugs or hemoptysis   subjective wheeze or overt sinus symptoms.   Sleeps ok  without nocturnal  or early am exacerbation  of respiratory  c/o's or need for noct saba. Also denies any obvious fluctuation of symptoms with weather or environmental changes or other aggravating or alleviating factors except as outlined above   No unusual exposure hx or h/o childhood pna/ asthma or knowledge of premature birth.  Current Allergies, Complete Past Medical History, Past Surgical History, Family History, and Social History were reviewed in [employer] record.  ROS  The following are not active complaints unless bolded Hoarseness, sore throat, dysphagia, dental problems, itching, sneezing,  nasal congestion or discharge of excess mucus or purulent secretions, ear ache,   fever, chills, sweats, unintended wt loss or wt gain, classically pleuritic cp,  orthopnea  pnd or arm/hand swelling  or leg swelling, presyncope, palpitations, abdominal pain, anorexia, nausea, vomiting, diarrhea  or change in bowel habits or change in bladder habits, change in stools or change in urine, dysuria, hematuria,  rash, arthralgias, visual complaints, headache, numbness, weakness or ataxia or problems with walking or coordination,  change in mood or  memory.           Past Medical History:  Diagnosis Date  . ABNORMAL EKG 10/07/2008  . ADVEF, DRUG/MED/BIOL SUBST, OTHER DRUG NOS 09/26/2006  . Allergy    seasonal  . Cataract    left eye-surgery 07-2015  . DEGENERATIVE JOINT DISEASE, MODERATE 04/13/2007  . DIVERTICULOSIS, COLON 04/13/2007  . ELEVATED PROSTATE SPECIFIC ANTIGEN 04/14/2007  . Glaucoma    sees [name]   . HYPERLIPIDEMIA 09/26/2006  . HYPERTENSION 04/13/2007  . JOINT EFFUSION, KNEE 06/15/2009  . Melanoma [facility])    sees [name]   . PROSTATE CANCER 10/05/2009   sees [name]  . Renal insufficiency    RESOLVED PER PATIENT    Outpatient Medications Prior to Visit  Medication Sig Dispense Refill  . aspirin EC 81 MG tablet Take 81 mg by mouth every evening.     . bimatoprost (LUMIGAN) 0.01 % SOLN Place 1 drop into both eyes at bedtime.    . clopidogrel (PLAVIX) 75 MG tablet Take 1 tablet (75 mg total) by mouth daily with breakfast. 90 tablet 8  .  Coenzyme Q10 (COQ-10) 100 MG CAPS Take 100 mg by mouth every evening.     . dorzolamide-timolol (COSOPT) 22.3-6.8 MG/ML ophthalmic solution Place 1 drop into both eyes 2 (two) times daily.     . famotidine (PEPCID) 20 MG tablet Take 20 mg by mouth 2 (two) times daily.    . fenofibrate 160 MG tablet Take 1 tablet (160 mg total) by mouth daily. (Patient taking differently: Take 160 mg by mouth every evening. ) 90 tablet 3  . isosorbide mononitrate (IMDUR) 30 MG 24 hr tablet Take 1 tablet (30 mg total) by mouth daily. 90 tablet 3  . lisinopril (PRINIVIL,ZESTRIL) 20 MG tablet Take 1 tablet (20 mg total) by mouth daily. 90  tablet 3  . magnesium oxide (MAG-OX) 400 MG tablet Take 400 mg by mouth every evening.     . Multiple Vitamins-Minerals (MULTIVITAMIN WITH MINERALS) tablet Take 1 tablet by mouth 2 (two) times a week. Sundays and Wednesdays    . nitroGLYCERIN (NITROSTAT) 0.4 MG SL tablet Place 1 tablet (0.4 mg total) under the tongue every 5 (five) minutes as needed. (Patient taking differently: Place 0.4 mg under the tongue every 5 (five) minutes as needed for chest pain. ) 25 tablet 3  . omeprazole (PRILOSEC) 40 MG capsule TAKE 1 CAPSULE BY MOUTH EVERY DAY 90 capsule 3  . rosuvastatin (CRESTOR) 20 MG tablet Take 20 mg by mouth every evening.         Objective:     BP (!) 158/80 (BP Location: Left Arm, Cuff Size: Normal)   Pulse 91   Temp 98.7 F (37.1 C) (Oral)   Ht 5\' 6"  (1.676 m)   Wt 198 lb 6.4 oz (90 kg)   SpO2 97%   BMI 32.02 kg/m   SpO2: 97 % RA  amb slt hoarse wm nad   HEENT: nl dentition, turbinates bilaterally, and oropharynx. Nl external ear canals without cough reflex   NECK :  without JVD/Nodes/TM/ nl carotid upstrokes bilaterally   LUNGS: no acc muscle use,  Nl contour chest which is clear to A and P bilaterally without cough on insp or exp maneuvers   CV:  RRR  no s3 or murmur or increase in P2, and no edema   ABD:  soft and nontender with nl inspiratory excursion in the supine position. No bruits or organomegaly appreciated, bowel sounds nl  MS:  Nl gait/ ext warm without deformities, calf tenderness, cyanosis or clubbing No obvious joint restrictions   SKIN: warm and dry without lesions    NEURO:  alert, approp, nl sensorium with  no motor or cerebellar deficits apparent.      I personally reviewed images and agree with radiology impression as follows:  CXR:   06/22/2018  No active cardiopulmonary disease.     Chemistry      Component Value Date/Time   NA 145 (H) 06/22/2018 0839   K 4.3 06/22/2018 0839   CL 106 06/22/2018 0839   CO2 19 (L) 06/22/2018 0839    BUN 18 06/22/2018 0839   CREATININE 1.50 (H) 06/22/2018 0839      Component Value Date/Time   CALCIUM 9.2 06/22/2018 0839   ALKPHOS 49 05/08/2018 1105   AST 15 05/08/2018 1105   ALT 16 05/08/2018 1105   BILITOT 0.6 05/08/2018 1105            Assessment   DOE (dyspnea on exertion) Onset summer 2019  - PFT's  08/08/2018  FEV1 3.31 (119 % )  ratio 0.76  p 6 % improvement from saba p nothing prior to study with DLCO  115 % corrects to 102 % for alv volume  And erv 37% - LHC 04/06/18  Nl lv/ LVEDP = 10 - Echo 06/22/18 nl  - 08/08/2018   Walked RA  2 laps @  approx 250ft each @ fast pace  stopped due to  End of study, min sob, sats fine - 08/08/2018 trial off acei     Symptoms are markedly disproportionate to objective findings and not clear to what extent this is actually a pulmonary  problem but pt does appear to have difficult to sort out respiratory symptoms of unknown origin for which  DDX  = almost all start with A and  include Adherence, Ace Inhibitors, Acid Reflux, Active Sinus Disease, Alpha 1 Antitripsin deficiency, Anxiety masquerading as Airways dz,  ABPA,  Allergy(esp in young), Aspiration (esp in elderly), Adverse effects of meds,  Active smoking or Vaping, A bunch of PE's/clot burden (a few small clots can't cause this syndrome unless there is already severe underlying pulm or vascular dz with poor reserve),  Anemia or thyroid disorder, plus two Bs  = Bronchiectasis and Beta blocker use..and one C= CHF     Adherence is always the initial "prime suspect" and is a multilayered concern that requires a "trust but verify" approach in every patient - starting with knowing how to use medications, especially inhalers, correctly, keeping up with refills and understanding the fundamental difference between maintenance and prns vs those medications only taken for a very short course and then stopped and not refilled.  -return with all meds in hand using a trust but verify approach to confirm  accurate Medication  Reconciliation The principal here is that until we are certain that the  patients are doing what we've asked, it makes no sense to ask them to do more.    ACEi adverse effects at the  top of the usual list of suspects and the only way to rule it out is a trial off > see a/p    ? Acid (or non-acid) GERD > always difficult to exclude as up to 75% of pts in some series report no assoc GI/ Heartburn symptoms> rec continue max (24h)  acid suppression and diet restrictions/ reviewed  .  Note some but not all of his symptoms improved with acid suppression suggesting the acid component may have improved with acid suppression but not the nonacid one which can cause some of the same symptoms I call "relex to reflux" = chest tightness/sob/hoarseness/upper airway obstruction  ? Anemia/ thryoid/metaboic disorder:  Note HC03 a bit low ? Primary or secondary > bmet needs to be repeated along with tsh p 1 week on micardis     ? Allergy/asthma > no variability or assoc rhintis or noct symptoms to suggest   ? BB effects;  Note now on timolol eyedrops but no evidence wheezing.  ? chf > ruled out by cards with nl echo 06/22/18   >>>  F/u 6 weeks / consider cpst then if not clearly improving.      Essential hypertension D/c acei 08/08/2018 for hoarseness and unexplained sob/dysphagia (oropharangeal)   In the best review of chronic cough to date ( NEJM 2016 375 0712-1975) ,  ACEi are now felt to cause cough in up to  20% of pts which is a 4 fold increase from previous reports and does not include the variety of non-specific complaints we see in  pulmonary clinic in pts on ACEi but previously attributed to another dx like  Copd/asthma and  include PNDS, throat and chest congestion, "bronchitis", unexplained dyspnea and noct "strangling" sensations, and hoarseness, but also  atypical /refractory GERD symptoms like dysphagia and "bad heartburn"   The only way I know  to prove this is not an "ACEi  Case" is a trial off ACEi x a minimum of 6 weeks then regroup.     >>>> Try micardis 80 mg daily, recheck bmet/tsh in 2 weeks     Total time devoted to counseling  > 50 % of initial 60 min office visit:  reviewed case with pt/ directly observed portions of ambulatory 02 saturation study/  discussion of options/alternatives/ personally creating written customized instructions  in presence of pt  then going over those specific  Instructions directly with the pt including how to use all of the meds but in particular covering each new medication in detail and the difference between the maintenance= "automatic" meds and the prns using an action plan format for the latter (If this problem/symptom => do that organization reading Left to right).  Please see AVS from this visit for a full list of these instructions which I personally wrote for this pt and  are unique to this visit.   [name] Gully, MD 08/08/2018

## 2018-08-08 NOTE — Assessment & Plan Note (Addendum)
Onset summer 2019  - PFT's  08/08/2018  FEV1 3.31 (119 % ) ratio 0.76  p 6 % improvement from saba p nothing prior to study with DLCO  115 % corrects to 102 % for alv volume  And erv 37% - LHC 04/06/18  Nl lv/ LVEDP = 10 - Echo 06/22/18 nl  - 08/08/2018   Walked RA  2 laps @  approx 247ft each @ fast pace  stopped due to  End of study, min sob, sats fine - 08/08/2018 trial off acei     Symptoms are markedly disproportionate to objective findings and not clear to what extent this is actually a pulmonary  problem but pt does appear to have difficult to sort out respiratory symptoms of unknown origin for which  DDX  = almost all start with A and  include Adherence, Ace Inhibitors, Acid Reflux, Active Sinus Disease, Alpha 1 Antitripsin deficiency, Anxiety masquerading as Airways dz,  ABPA,  Allergy(esp in young), Aspiration (esp in elderly), Adverse effects of meds,  Active smoking or Vaping, A bunch of PE's/clot burden (a few small clots can't cause this syndrome unless there is already severe underlying pulm or vascular dz with poor reserve),  Anemia or thyroid disorder, plus two Bs  = Bronchiectasis and Beta blocker use..and one C= CHF     Adherence is always the initial "prime suspect" and is a multilayered concern that requires a "trust but verify" approach in every patient - starting with knowing how to use medications, especially inhalers, correctly, keeping up with refills and understanding the fundamental difference between maintenance and prns vs those medications only taken for a very short course and then stopped and not refilled.  -return with all meds in hand using a trust but verify approach to confirm accurate Medication  Reconciliation The principal here is that until we are certain that the  patients are doing what we've asked, it makes no sense to ask them to do more.    ACEi adverse effects at the  top of the usual list of suspects and the only way to rule it out is a trial off > see a/p     ? Acid (or non-acid) GERD > always difficult to exclude as up to 75% of pts in some series report no assoc GI/ Heartburn symptoms> rec continue max (24h)  acid suppression and diet restrictions/ reviewed  .  Note some but not all of his symptoms improved with acid suppression suggesting the acid component may have improved with acid suppression but not the nonacid one which can cause some of the same symptoms I call "relex to reflux" = chest tightness/sob/hoarseness/upper airway obstruction  ? Anemia/ thryoid/metaboic disorder:  Note HC03 a bit low ? Primary or secondary > bmet needs to be repeated along with tsh p 1 week on micardis     ? Allergy/asthma > no variability or assoc rhintis or noct symptoms to suggest   ? BB effects;  Note now on timolol eyedrops but no evidence wheezing.  ? chf > ruled out by cards with nl echo 06/22/18     >>>  F/u 6 weeks / consider cpst then if not clearly improving.

## 2018-08-09 ENCOUNTER — Encounter: Payer: Self-pay | Admitting: Internal Medicine

## 2018-08-09 ENCOUNTER — Telehealth: Payer: Self-pay | Admitting: *Deleted

## 2018-08-09 DIAGNOSIS — R0609 Other forms of dyspnea: Secondary | ICD-10-CM

## 2018-08-09 NOTE — Telephone Encounter (Signed)
Pt aware need labs in 2 weeks. Only labs unless he is not feeling better. Pt verbalized understanding Labs ordered. Nothing further needed.

## 2018-08-09 NOTE — Telephone Encounter (Signed)
Patient is returning phone call.  Patient phone number is [phone].

## 2018-08-09 NOTE — Assessment & Plan Note (Addendum)
D/c acei 08/08/2018 for hoarseness and unexplained sob/dysphagia (oropharangeal)   In the best review of chronic cough to date ( NEJM 2016 375 2395-3202) ,  ACEi are now felt to cause cough in up to  20% of pts which is a 4 fold increase from previous reports and does not include the variety of non-specific complaints we see in pulmonary clinic in pts on ACEi but previously attributed to another dx like  Copd/asthma and  include PNDS, throat and chest congestion, "bronchitis", unexplained dyspnea and noct "strangling" sensations, and hoarseness, but also  atypical /refractory GERD symptoms like dysphagia and "bad heartburn"   The only way I know  to prove this is not an "ACEi Case" is a trial off ACEi x a minimum of 6 weeks then regroup.     >>>> Try micardis 80 mg daily, recheck bmet/tsh in 2 weeks    Total time devoted to counseling  > 50 % of initial 60 min office visit:  reviewed case with pt/ directly observed portions of ambulatory 02 saturation study/  discussion of options/alternatives/ personally creating written customized instructions  in presence of pt  then going over those specific  Instructions directly with the pt including how to use all of the meds but in particular covering each new medication in detail and the difference between the maintenance= "automatic" meds and the prns using an action plan format for the latter (If this problem/symptom => do that organization reading Left to right).  Please see AVS from this visit for a full list of these instructions which I personally wrote for this pt and  are unique to this visit.

## 2018-08-09 NOTE — Telephone Encounter (Signed)
LMTCB

## 2018-08-09 NOTE — Telephone Encounter (Signed)
-----   Message from Tanda Rockers, MD sent at 08/09/2018  6:20 AM EDT ----- Will need repeat bmet /tsh in 2 weeks dx doe - does not need ov then unless not beginning to feel better by then

## 2018-08-22 ENCOUNTER — Other Ambulatory Visit (INDEPENDENT_AMBULATORY_CARE_PROVIDER_SITE_OTHER): Payer: Medicare Other

## 2018-08-22 DIAGNOSIS — R0609 Other forms of dyspnea: Secondary | ICD-10-CM

## 2018-08-22 LAB — BASIC METABOLIC PANEL
BUN: 21 mg/dL (ref 6–23)
CO2: 25 mEq/L (ref 19–32)
Calcium: 9.3 mg/dL (ref 8.4–10.5)
Chloride: 108 mEq/L (ref 96–112)
Creatinine, Ser: 1.49 mg/dL (ref 0.40–1.50)
GFR: 46.52 mL/min — ABNORMAL LOW (ref >60.00)
Glucose, Bld: 125 mg/dL — ABNORMAL HIGH (ref 70–99)
Potassium: 3.9 mEq/L (ref 3.5–5.1)
Sodium: 141 mEq/L (ref 135–145)

## 2018-08-22 LAB — TSH: TSH: 2.51 u[IU]/mL (ref 0.35–4.50)

## 2018-08-30 ENCOUNTER — Institutional Professional Consult (permissible substitution): Payer: Medicare Other | Admitting: Internal Medicine

## 2018-08-31 ENCOUNTER — Encounter (HOSPITAL_COMMUNITY): Payer: Self-pay | Admitting: *Deleted

## 2018-08-31 ENCOUNTER — Telehealth (HOSPITAL_COMMUNITY): Payer: Self-pay | Admitting: *Deleted

## 2018-08-31 DIAGNOSIS — Z955 Presence of coronary angioplasty implant and graft: Secondary | ICD-10-CM

## 2018-08-31 NOTE — Progress Notes (Signed)
Discharge Progress Report  Patient Details  Name: [name] [name]. MRN: [id number] Date of Birth: 03/15/1945 Referring Provider:     CARDIAC REHAB PHASE II ORIENTATION from 03/22/2018 in [place]  Referring Provider  [name]       Number of Visits: 14  Reason for Discharge:  Patient has met program and personal goals.  Smoking History:  Social History   Tobacco Use  Smoking Status Former Smoker  . Packs/day: 1.00  . Years: 9.00  . Pack years: 9.00  . Start date: 03/08/1963  . Quit date: 03/07/1972  . Years since quitting: 46.5  Smokeless Tobacco Never Used  Tobacco Comment   States AAA completed     Diagnosis:  Status post coronary artery stent placement  ADL UCSD:   Initial Exercise Prescription:   Discharge Exercise Prescription (Final Exercise Prescription Changes):   Functional Capacity:   Psychological, QOL, Others - Outcomes: PHQ 2/9: Depression screen [facility] [address] 2/9 05/11/2018 03/28/2018 05/09/2017 05/09/2017 05/05/2016  Decreased Interest 0 0 0 0 0  Down, Depressed, Hopeless 0 0 0 0 0  PHQ - 2 Score 0 0 0 0 0  Altered sleeping 0 - - - -  Tired, decreased energy 0 - - - -  Change in appetite 0 - - - -  Feeling bad or failure about yourself  0 - - - -  Trouble concentrating 0 - - - -  Moving slowly or fidgety/restless 0 - - - -  Suicidal thoughts 0 - - - -  PHQ-9 Score 0 - - - -    Quality of Life:   Personal Goals: Goals established at orientation with interventions provided to work toward goal.    Personal Goals Discharge:   Exercise Goals and Review:   Exercise Goals Re-Evaluation:   Nutrition & Weight - Outcomes:    Nutrition:   Nutrition Discharge:   Education Questionnaire Score:   [name] attended 17 exercise sessions between 03/22/18-05/18/18. [name] did well with exercise and had good attendance. Cardiac rehab closed in March die to the coronavirus pandemic. [name] has decided to be discharged  from cardiac rehab as he is now being evaluated for shortness of breath by Dr Melvyn Novas. [name] has been exercising on his own at home.Barnet Pall, RN,BSN 08/31/2018 9:37 AM

## 2018-09-19 ENCOUNTER — Encounter: Payer: Self-pay | Admitting: Internal Medicine

## 2018-09-19 ENCOUNTER — Other Ambulatory Visit: Payer: Self-pay

## 2018-09-19 ENCOUNTER — Telehealth: Payer: Self-pay | Admitting: Internal Medicine

## 2018-09-19 ENCOUNTER — Ambulatory Visit (INDEPENDENT_AMBULATORY_CARE_PROVIDER_SITE_OTHER): Payer: Medicare Other | Admitting: Internal Medicine

## 2018-09-19 VITALS — BP 134/70 | HR 65 | Temp 98.5°F | Ht 66.0 in | Wt 198.0 lb

## 2018-09-19 DIAGNOSIS — I209 Angina pectoris, unspecified: Secondary | ICD-10-CM | POA: Diagnosis not present

## 2018-09-19 DIAGNOSIS — R058 Other specified cough: Secondary | ICD-10-CM

## 2018-09-19 DIAGNOSIS — I10 Essential (primary) hypertension: Secondary | ICD-10-CM | POA: Diagnosis not present

## 2018-09-19 DIAGNOSIS — R0609 Other forms of dyspnea: Secondary | ICD-10-CM

## 2018-09-19 DIAGNOSIS — C61 Malignant neoplasm of prostate: Secondary | ICD-10-CM

## 2018-09-19 DIAGNOSIS — R05 Cough: Secondary | ICD-10-CM

## 2018-09-19 MED ORDER — TELMISARTAN 80 MG PO TABS: 80.0000 mg | ORAL_TABLET | Freq: Every day | ORAL | 4 refills | Status: DC

## 2018-09-19 NOTE — Assessment & Plan Note (Signed)
Onset summer 2019  - PFT's  08/08/2018  FEV1 3.31 (119 % ) ratio 0.76  p 6 % improvement from saba p nothing prior to study with DLCO  115 % corrects to 102 % for alv volume  And erv 37% - LHC 04/06/18  Nl lv/ LVEDP = 10 - Echo 06/22/18 nl  - 08/08/2018   Walked RA  2 laps @  approx 274ft each @ fast pace  stopped due to  End of study, min sob, sats fine - 08/08/2018 trial off acei   - 09/19/2018   Walked RA  2 laps @  approx 24ft each @ brisk  pace  stopped due to  End of study, sats 96% min sob    Not able to reproduce doe in office setting so rec reconditioning rx x one month then cpst if not better

## 2018-09-19 NOTE — Progress Notes (Signed)
[name] [name]., male    DOB: 06-Jun-1945     MRN: [id number]   Brief patient profile:  72 yowm quit smoking 1974 with new onset doe/hoarseness  assoc chest tightness  starting in summer 2019  > cardiac eval > stent Jan 04 2018 > resumed same activity = swimming > same symptoms > then also began having similar sitting in lazyboy assoc with dysphagia so  eval by Ardis Hughs in feb 2020 rec acid suppression which helped some of the pressure and all the belching o/w no better ex tol so referred to pulmonary clinic 08/08/2018 by Dr  Dimple Nanas in spring of 2019 =  194    History of Present Illness  08/08/2018  Pulmonary/ 1st office eval/Terease Marcotte  Chief Complaint  Patient presents with   Pulmonary Consult    Referred by Jenkins Rouge for DOE.   Dyspnea:  slt hill to mb x 200 ft gets chest tight and sob most times/ same problem with steps  Cough:  Day > noct  Dry assoc hoarseness  Sleep: prone /flat bed /one skinny pillow SABA use: none ppi helped belching but not cp,sob rec Continue prilosec and pepcid as you are  GERD  Stop lisinopril and start micardis 80 mg (telmisartan) one daily     09/19/2018  f/u ov/Khi Mcmillen re: unexplained sob  Chief Complaint  Patient presents with   Shortness of Breath    Not better since last visit. Did not see any difference in taking Micardis.  Dyspnea:  Sitting in lazy boy still having symptoms 2-3 x per week of chest tight and 10-15  Tends to happen after supper and belping helps but the pattern is better p belching  Cough: none, loses breath sometimes when talking  Sleeping: flat bed one pillow  SABA use: none  02: none    No obvious day to day or daytime variability or assoc excess/ purulent sputum or mucus plugs or hemoptysis or cp or chest tightness, subjective wheeze or overt sinus or hb symptoms.   99 without nocturnal  or early am exacerbation  of respiratory  c/o's or need for noct saba. Also denies any obvious fluctuation of symptoms with weather or  environmental changes or other aggravating or alleviating factors except as outlined above   No unusual exposure hx or h/o childhood pna/ asthma or knowledge of premature birth.  Current Allergies, Complete Past Medical History, Past Surgical History, Family History, and Social History were reviewed in [employer] record.  ROS  The following are not active complaints unless bolded Hoarseness, sore throat, dysphagia, dental problems, itching, sneezing,  nasal congestion or discharge of excess mucus or purulent secretions, ear ache,   fever, chills, sweats, unintended wt loss or wt gain, classically pleuritic or exertional cp,  orthopnea pnd or arm/hand swelling  or leg swelling, presyncope, palpitations, abdominal pain, anorexia, nausea, vomiting, diarrhea  or change in bowel habits or change in bladder habits, change in stools or change in urine, dysuria, hematuria,  rash, arthralgias, visual complaints, headache, numbness, weakness or ataxia or problems with walking or coordination,  change in mood or  memory.        Current Meds  Medication Sig   aspirin EC 81 MG tablet Take 81 mg by mouth every evening.    bimatoprost (LUMIGAN) 0.01 % SOLN Place 1 drop into both eyes at bedtime.   clopidogrel (PLAVIX) 75 MG tablet Take 1 tablet (75 mg total) by mouth daily with  breakfast.   Coenzyme Q10 (COQ-10) 100 MG CAPS Take 100 mg by mouth every evening.    dorzolamide-timolol (COSOPT) 22.3-6.8 MG/ML ophthalmic solution Place 1 drop into both eyes 2 (two) times daily.    famotidine (PEPCID) 20 MG tablet Take 20 mg before bed    isosorbide mononitrate (IMDUR) 30 MG 24 hr tablet Take 1 tablet (30 mg total) by mouth daily.   magnesium oxide (MAG-OX) 400 MG tablet Take 400 mg by mouth every evening.    Multiple Vitamins-Minerals (MULTIVITAMIN WITH MINERALS) tablet Take 1 tablet by mouth 2 (two) times a week. Sundays and Wednesdays   omeprazole (PRILOSEC) 40 MG capsule TAKE 1  CAPSULE BY MOUTH EVERY DAY   rosuvastatin (CRESTOR) 20 MG tablet Take 20 mg by mouth every evening.    telmisartan (MICARDIS) 80 MG tablet Take 1 tablet (80 mg total) by mouth daily.           Past Medical History:  Diagnosis Date   ABNORMAL EKG 10/07/2008   ADVEF, DRUG/MED/BIOL SUBST, OTHER DRUG NOS 09/26/2006   Allergy    seasonal   Cataract    left eye-surgery 07-2015   DEGENERATIVE JOINT DISEASE, MODERATE 04/13/2007   DIVERTICULOSIS, COLON 04/13/2007   ELEVATED PROSTATE SPECIFIC ANTIGEN 04/14/2007   Glaucoma    sees [name]    HYPERLIPIDEMIA 09/26/2006   HYPERTENSION 04/13/2007   JOINT EFFUSION, KNEE 06/15/2009   Melanoma ([place])    sees [name]    PROSTATE CANCER 10/05/2009   sees [name]   Renal insufficiency    RESOLVED PER PATIENT      Objective:     amb wm min hoarse nad   Wt Readings from Last 3 Encounters:  09/19/18 198 lb (89.8 kg)  08/08/18 198 lb 6.4 oz (90 kg)  08/01/18 190 lb (86.2 kg)     Vital signs reviewed - Note on arrival 02 sats  99% on RA   HEENT: nl dentition, turbinates bilaterally, and oropharynx. Nl external ear canals without cough reflex   NECK :  without JVD/Nodes/TM/ nl carotid upstrokes bilaterally   LUNGS: no acc muscle use,  Nl contour chest which is clear to A and P bilaterally without cough on insp or exp maneuvers   CV:  RRR  no s3 or murmur or increase in P2, and no edema   ABD:  soft and nontender with nl inspiratory excursion in the supine position. No bruits or organomegaly appreciated, bowel sounds nl  MS:  Nl gait/ ext warm without deformities, calf tenderness, cyanosis or clubbing No obvious joint restrictions   SKIN: warm and dry without lesions    NEURO:  alert, approp, nl sensorium with  no motor or cerebellar deficits apparent.            Assessment

## 2018-09-19 NOTE — Telephone Encounter (Signed)
Call made to patient, he states he did not call us and he was aware Dr. Melvyn Novas ordered the CPST in 1 month. Order placed to be done in 1 month. Patient made aware there is a delay in scheduling and they will call him closer to tim to schedule. Voiced understanding. Will route back to [facility] pool as FYI. Nothing further is needed at this time.

## 2018-09-19 NOTE — Patient Instructions (Addendum)
Continue prilosec 40 mg Take 30-60 min before first meal of the day and take pepcid 20 mg right after supper for at least a month to see if you can eliminate the symptoms in the lazy boy and if not you'll need to see Dr Ardis Hughs    To get the most out of exercise, you need to be continuously aware that you are short of breath, but never out of breath, for 30 minutes daily. As you improve, it will actually be easier for you to do the same amount of exercise  in  30 minutes so always push to the level where you are short of breath.     We will schedule you CPST  in a month to sort your problem

## 2018-09-19 NOTE — Telephone Encounter (Signed)
There has not been a CPST ordered.  I believe there is a delay in scheduling them also due to Covid

## 2018-09-19 NOTE — Assessment & Plan Note (Signed)
Resolved of acei as of 09/19/2018   Upper airway cough syndrome (previously labeled PNDS),  is so named because it's frequently impossible to sort out how much is  CR/sinusitis with freq throat clearing (which can be related to primary GERD)   vs  causing  secondary (" extra esophageal")  GERD from wide swings in gastric pressure that occur with throat clearing, often  promoting self use of mint and menthol lozenges that reduce the lower esophageal sphincter tone and exacerbate the problem further in a cyclical fashion.   These are the same pts (now being labeled as having "irritable larynx syndrome" by some cough centers) who not infrequently have a history of having failed to tolerate ace inhibitors,  dry powder inhalers or biphosphonates or report having atypical/extraesophageal reflux symptoms that don't respond to standard doses of PPI  and are easily confused as having aecopd or asthma flares by even experienced allergists/ pulmonologists (myself included).   Pt says better to wife's satisfaction so no further w/u contemplated

## 2018-09-19 NOTE — Assessment & Plan Note (Signed)
D/c acei 08/08/2018 for hoarseness and unexplained sob/dysphagia (oropharangeal) >iimproved 09/19/2018   Although even in retrospect it may not be clear the ACEi contributed to the pt's symptoms,  Pt improved off them and adding them back at this point or in the future would risk confusion in interpretation of non-specific respiratory symptoms to which this patient is prone  ie  Better not to muddy the waters here.   Continue micardis at least until cpst then ok to rechallenge with acei if desired but be on the lookout for recurrent cough or atypical resp symptoms like doe that's not reproducible in office   I had an extended discussion with the patient  reviewing all relevant studies completed to date and  lasting 15 to 20 minutes of a 25 minute visit  which included directly observing ambulatory 02 saturation study documented in a/p section of  today's  office note.  Each maintenance medication was reviewed in detail including most importantly the difference between maintenance and prns and under what circumstances the prns are to be triggered using an action plan format that is not reflected in the computer generated alphabetically organized AVS.     Please see AVS for specific instructions unique to this visit that I personally wrote and verbalized to the the pt in detail and then reviewed with pt  by my nurse highlighting any changes in therapy recommended at today's visit .

## 2018-10-23 DIAGNOSIS — Z9889 Other specified postprocedural states: Secondary | ICD-10-CM | POA: Diagnosis not present

## 2018-10-23 DIAGNOSIS — Z961 Presence of intraocular lens: Secondary | ICD-10-CM | POA: Diagnosis not present

## 2018-10-23 DIAGNOSIS — H40053 Ocular hypertension, bilateral: Secondary | ICD-10-CM | POA: Diagnosis not present

## 2018-10-23 DIAGNOSIS — H26492 Other secondary cataract, left eye: Secondary | ICD-10-CM | POA: Diagnosis not present

## 2018-10-23 DIAGNOSIS — H25811 Combined forms of age-related cataract, right eye: Secondary | ICD-10-CM | POA: Diagnosis not present

## 2018-10-25 NOTE — Telephone Encounter (Signed)
PCC's can you look into what happened with the CPST order?

## 2018-10-27 NOTE — Progress Notes (Signed)
CARDIOLOGY OFFICE NOTE  Date:  10/29/2018    [name] [name]. Date of Birth: [birth year] Medical Record [id number]  PCP:  [name], MD  Cardiologist:  Johnsie Cancel  Chief Complaint  Patient presents with  . Follow-up    Seen for [name]    History of Present Illness: [name] [name]. is a 73 y.o. male who presents today for a 6 month check. Seen for [name].   He has a history of HTN, HLD, DM and CAD. He had prior stenting of the LCX in November of 2019. Did have recurrent pain post intervention and was placed on Imdur. Follow up Myoview 02/2018 non ischemic with EF of 60%. Ended up having repeat cath 03/2018 - to manage medically. Has had myalgias on statin - dose has been decreased. Has had chronic dyspnea since his original PCI.   Last seen in February by [name]. Still with some chest pain - somewhat better with Imdur as well as the addition of PPI therapy by PCP noted. Could consider Ranexa if needed.   The patient does not have symptoms concerning for COVID-19 infection (fever, chills, cough, or new shortness of breath).   Comes in today. Here alone. He notes he still has his same issues - still short of breath. He is seeing GI - [name] and is on PPI with good results with burping. He actually takes Pepcid and PPI therapy per GI.  He sees Dr. Melvyn Novas as well - BP meds have been changed - ACE to ARB - no real change. He is to have a stress test "for my lungs" and he is "practicing" for this now. Looks like this is a CPX - but notes "they lost me" and looks like it needs to be scheduled. I suspect this is all related to COVID. I do see the order - just not scheduled. He likes to swim - this has been hard to schedule due to COVID - so he has not really been going to swim. He has been using the treadmill - gets short of breath at 3.0 mph after 2 to 3 minutes but stays on for 30 minutes and is short of breath the entire time. BP better at home - lower than what we got  here today. Asking about antibiotics (would not need) and stopping Plavix at the dentist (can't stop). He sees dermatology regularly - has had 5 melanomas in the past - advised he would have to do this on Plavix. He is back to full dose Crestor. He has his physical in 2 weeks and will be getting a flu shot.   Past Medical History:  Diagnosis Date  . ABNORMAL EKG 10/07/2008  . ADVEF, DRUG/MED/BIOL SUBST, OTHER DRUG NOS 09/26/2006  . Allergy    seasonal  . Cataract    left eye-surgery 07-2015  . DEGENERATIVE JOINT DISEASE, MODERATE 04/13/2007  . DIVERTICULOSIS, COLON 04/13/2007  . ELEVATED PROSTATE SPECIFIC ANTIGEN 04/14/2007  . Glaucoma    sees [name]   . HYPERLIPIDEMIA 09/26/2006  . HYPERTENSION 04/13/2007  . JOINT EFFUSION, KNEE 06/15/2009  . Melanoma [facility])    sees [name]   . PROSTATE CANCER 10/05/2009   sees [name]  . Renal insufficiency    RESOLVED PER PATIENT    Past Surgical History:  Procedure Laterality Date  . ACROMIOPLASTY Right 05-06-14   per [name]   . BUNIONECTOMY     bilateral  . CATARACT EXTRACTION  Left    per [name]   . COLONOSCOPY  07/10/2015   per [name], benign polyp, repeat in 10 yrs   . CORONARY STENT INTERVENTION N/A 01/17/2018   Procedure: CORONARY STENT INTERVENTION;  Surgeon: [name], MD;  Location: [place] CV LAB;  Service: Cardiovascular;  Laterality: N/A;  . CYSTECTOMY     benign from hand  . GLAUCOMA SURGERY Bilateral 2014   . heart stent    . INSERTION OF MESH N/A 01/10/2013   Procedure: INSERTION OF MESH;  Surgeon: Earnstine Regal, MD;  Location: [place];  Service: General;  Laterality: N/A;  . KNEE ARTHROSCOPY Right Jan. 2013   right knee, per [name]   . KNEE ARTHROSCOPY Left 01-14-15   per [name]   . LEFT HEART CATH AND CORONARY ANGIOGRAPHY N/A 01/17/2018   Procedure: LEFT HEART CATH AND CORONARY ANGIOGRAPHY;  Surgeon: [name], MD;  Location: [place] CV LAB;  Service: Cardiovascular;  Laterality: N/A;   . LEFT HEART CATH AND CORONARY ANGIOGRAPHY N/A 04/06/2018   Procedure: LEFT HEART CATH AND CORONARY ANGIOGRAPHY;  Surgeon: [name], MD;  Location: [place] CV LAB;  Service: Cardiovascular;  Laterality: N/A;  . MELANOMA EXCISION  2012   per [name]  . PROSTATECTOMY     per [name]  . TONSILLECTOMY    . UMBILICAL HERNIA REPAIR  11/01/2011   Procedure: HERNIA REPAIR UMBILICAL ADULT;  Surgeon: Earnstine Regal, MD;  Location: [place];  Service: General;  Laterality: N/A;  Repair umbilical hernia with mesh patch  . VASECTOMY    . VENTRAL HERNIA REPAIR  01/10/2013   with mesh     [name]  . VENTRAL HERNIA REPAIR N/A 01/10/2013   Procedure: HERNIA REPAIR VENTRAL ADULT ;  Surgeon: Earnstine Regal, MD;  Location: [place];  Service: General;  Laterality: N/A;     Medications: Current Meds  Medication Sig  . aspirin EC 81 MG tablet Take 81 mg by mouth every evening.   . bimatoprost (LUMIGAN) 0.01 % SOLN Place 1 drop into both eyes at bedtime.  . clopidogrel (PLAVIX) 75 MG tablet Take 1 tablet (75 mg total) by mouth daily with breakfast.  . Coenzyme Q10 (COQ-10) 100 MG CAPS Take 100 mg by mouth every evening.   . dorzolamide-timolol (COSOPT) 22.3-6.8 MG/ML ophthalmic solution Place 1 drop into both eyes 2 (two) times daily.   . famotidine (PEPCID) 20 MG tablet Take 20 mg by mouth 2 (two) times daily.  . fenofibrate 160 MG tablet Take 160 mg by mouth daily.  . isosorbide mononitrate (IMDUR) 30 MG 24 hr tablet Take 1 tablet (30 mg total) by mouth daily.  . magnesium oxide (MAG-OX) 400 MG tablet Take 400 mg by mouth every evening.   . Multiple Vitamins-Minerals (MULTIVITAMIN WITH MINERALS) tablet Take 1 tablet by mouth 2 (two) times a week. Sundays and Wednesdays  . nitroGLYCERIN (NITROSTAT) 0.4 MG SL tablet Place 0.4 mg under the tongue every 5 (five) minutes as needed for chest pain.  Marland Kitchen omeprazole (PRILOSEC) 40 MG capsule TAKE 1 CAPSULE BY MOUTH EVERY DAY  .  rosuvastatin (CRESTOR) 20 MG tablet Take 20 mg by mouth every evening.   Marland Kitchen telmisartan (MICARDIS) 80 MG tablet Take 1 tablet (80 mg total) by mouth daily.  . [DISCONTINUED] fenofibrate 160 MG tablet Take 1 tablet (160 mg total) by mouth daily. (Patient taking differently: Take 160 mg by mouth every  evening. )     Allergies: No Known Allergies  Social History: The patient  reports that he quit smoking about 46 years ago. He started smoking about 55 years ago. He has a 9.00 pack-year smoking history. He has never used smokeless tobacco. He reports current alcohol use of about 1.0 standard drinks of alcohol per week. He reports that he does not use drugs.   Family History: The patient's family history includes Heart failure in his father; Stroke in his mother.   Review of Systems: Please see the history of present illness.   All other systems are reviewed and negative.   Physical Exam: VS:  BP (!) 142/80 (BP Location: Left Arm, Patient Position: Sitting, Cuff Size: Large)   Pulse (!) 57   Ht 5\' 6"  (1.676 m)   Wt 199 lb 12.8 oz (90.6 kg)   SpO2 99% Comment: at rest  BMI 32.25 kg/m  .  BMI Body mass index is 32.25 kg/m.  Wt Readings from Last 3 Encounters:  10/29/18 199 lb 12.8 oz (90.6 kg)  09/19/18 198 lb (89.8 kg)  08/08/18 198 lb 6.4 oz (90 kg)    General: Pleasant. Very talkative. Alert and in no acute distress.   HEENT: Normal.  Neck: Supple, no JVD, carotid bruits, or masses noted.  Cardiac: Regular rate and rhythm. No murmurs, rubs, or gallops. No edema.  Respiratory:  Lungs are clear to auscultation bilaterally with normal work of breathing.  GI: Soft and nontender.  MS: No deformity or atrophy. Gait and ROM intact.  Skin: Warm and dry. Color is normal.  Neuro:  Strength and sensation are intact and no gross focal deficits noted.  Psych: Alert, appropriate and with normal affect.   LABORATORY DATA:  EKG:  EKG is not ordered today.   Lab Results  Component Value  Date   WBC 7.7 03/29/2018   HGB 14.6 06/22/2018   HCT 44.4 06/22/2018   PLT 243 03/29/2018   GLUCOSE 125 (H) 08/22/2018   CHOL 150 05/08/2018   TRIG 117.0 05/08/2018   HDL 44.40 05/08/2018   LDLDIRECT 82.1 04/07/2011   LDLCALC 82 05/08/2018   ALT 16 05/08/2018   AST 15 05/08/2018   NA 141 08/22/2018   K 3.9 08/22/2018   CL 108 08/22/2018   CREATININE 1.49 08/22/2018   BUN 21 08/22/2018   CO2 25 08/22/2018   TSH 2.51 08/22/2018   PSA 0.38 11/06/2015   HGBA1C 6.0 05/08/2018     BNP (last 3 results) No results for input(s): BNP in the last 8760 hours.  ProBNP (last 3 results) No results for input(s): PROBNP in the last 8760 hours.   Other Studies Reviewed Today:  Cath 04/06/18  Conclusion    Widely patent mid circumflex drug-eluting stent. The circumflex beyond the stent is unchanged from before with a 50 to 70% distal bifurcation stenosis in a [place] 110 configuration. The circumflex is otherwise patent.  Left main is normal.  LAD is widely patent. A branch of the first diagonal contains ostial 80% narrowing. This tertiary branches are small.  RCA is widely patent with distal PDA and LV branch diffuse disease.  Normal LV function and LVEDP 10 mmHg.  RECOMMENDATIONS:   It is conceivable that angina is due to the very distal bifurcation disease in the circumflex. It does not appear to be angiographically severe. Stent implantation would include crossing over an equally large side branch. Considering the stability, complexity of an interventional procedure (with risk of side branch  occlusion), and the far distal location, recommend continued conservative medical management possibly adding either Ranexa or beta-blocker therapy to the regimen.     ASSESSMENT AND PLAN:  1. CAD: 01/17/18 stent to the prox to mid Cx Single vessel disease with good result. He was been placed on isosorbide 15 mg but persistent symptoms F/U myovue done 03/01/18 low risk with  no ischemia EF 60% continue medical Rx  Repeat Cath 04/06/18 with widely patent stent with medical Rx recommended for other disease. He is to have CPX testing - I suspect this has been pushed out due to [facility]. Encouraged him to work on his diet/exercise and focus on weight loss. He is not interested in taking Ranexa at this time. Continue DAPT - it cannot be interrupted at this time. He is advised to let all his providers know that he is taking Plavix. Does not need SBE from our standpoint.  2. Myalgia Pain: he is back on 20 mg - no problems noted - has lab with his PCP in 2 weeks.   3. Hypertension: BP is lower at home - no changes made today.   4. HLD:  back on full dose statin therapy - see #2.   5. GERD - on both Pepcid and PPI per GI - no changes but will have to be changed to Protonix due to the drug interaction with the Plavix.   6. DM - per PCP  7. Chronic dyspnea - followed by pulmonary as well - I suspect his CPX is pending due to COVID - encouraged him to really work on his diet and weight - I think weight loss would help him considerably.   8. Obesity - see above.   9. COVID-19 Education: The signs and symptoms of COVID-19 were discussed with the patient and how to seek care for testing (follow up with PCP or arrange E-visit).  The importance of social distancing, staying at home, hand hygiene and wearing a mask when out in public were discussed today.  Current medicines are reviewed with the patient today.  The patient does not have concerns regarding medicines other than what has been noted above.  The following changes have been made:  See above.  Labs/ tests ordered today include:   No orders of the defined types were placed in this encounter.    Disposition:   FU with [name] in 6 months. I am happy to see back as needed.   Patient is agreeable to this plan and will call if any problems develop in the interim.   SignedTruitt Merle, NP  10/29/2018 8:58 AM   [place] [address] [place], [place]  [address] Phone: [phone] Fax: [phone]

## 2018-10-29 ENCOUNTER — Ambulatory Visit (INDEPENDENT_AMBULATORY_CARE_PROVIDER_SITE_OTHER): Payer: Medicare Other | Admitting: Nurse Practitioner

## 2018-10-29 ENCOUNTER — Encounter: Payer: Self-pay | Admitting: Nurse Practitioner

## 2018-10-29 ENCOUNTER — Other Ambulatory Visit: Payer: Self-pay

## 2018-10-29 VITALS — BP 142/80 | HR 57 | Ht 66.0 in | Wt 199.8 lb

## 2018-10-29 DIAGNOSIS — I25118 Atherosclerotic heart disease of native coronary artery with other forms of angina pectoris: Secondary | ICD-10-CM | POA: Diagnosis not present

## 2018-10-29 DIAGNOSIS — I209 Angina pectoris, unspecified: Secondary | ICD-10-CM | POA: Diagnosis not present

## 2018-10-29 DIAGNOSIS — Z79899 Other long term (current) drug therapy: Secondary | ICD-10-CM

## 2018-10-29 DIAGNOSIS — I119 Hypertensive heart disease without heart failure: Secondary | ICD-10-CM

## 2018-10-29 DIAGNOSIS — E782 Mixed hyperlipidemia: Secondary | ICD-10-CM | POA: Diagnosis not present

## 2018-10-29 DIAGNOSIS — Z7189 Other specified counseling: Secondary | ICD-10-CM | POA: Diagnosis not present

## 2018-10-29 DIAGNOSIS — Z955 Presence of coronary angioplasty implant and graft: Secondary | ICD-10-CM | POA: Diagnosis not present

## 2018-10-29 DIAGNOSIS — I10 Essential (primary) hypertension: Secondary | ICD-10-CM

## 2018-10-29 MED ORDER — PANTOPRAZOLE SODIUM 40 MG PO TBEC: 40.0000 mg | DELAYED_RELEASE_TABLET | Freq: Every day | ORAL | 3 refills | Status: AC

## 2018-10-29 NOTE — Patient Instructions (Addendum)
After Visit Summary:  We will be checking the following labs today - NONE   Medication Instructions:    Continue with your current medicines.   STOP the Prilosec - I will replace this with Protonix 40 mg a day   If you need a refill on your cardiac medications before your next appointment, please call your pharmacy.     Testing/Procedures To Be Arranged:  N/A  Follow-Up:   See [name] in 6 months.   At [facility], you and your health needs are our priority.  As part of our continuing mission to provide you with exceptional heart care, we have created designated Provider Care Teams.  These Care Teams include your primary Cardiologist (physician) and Advanced Practice Providers (APPs -  Physician Assistants and Nurse Practitioners) who all work together to provide you with the care you need, when you need it.  Special Instructions:  . Stay safe, stay home, wash your hands for at least 20 seconds and wear a mask when out in public.  . It was good to talk with you today.  . Think about what we talked about today.  . Make sure your doctors know you take Plavix.    Call the [place] office at [phone] if you have any questions, problems or concerns.

## 2018-10-30 NOTE — Telephone Encounter (Addendum)
There is a wait list for these to be scheduled.   I sent a message on 7/15 to [name] [name] who does this test and she is aware to call [name] [name].  I did call this morning and verified they are calling patients now to schedule.  His name is on the list but they told me it still may be a few weeks before he is called because there is several people ahead of him.  The [facility] at [facility] is where the test is done and their phone # is [phone].  Patient can call to check with them if he doesn't hear from them in a few weeks.  Will route to triage nurse to send message in MyChart to [name] [name] to make him aware.

## 2018-11-13 ENCOUNTER — Encounter: Payer: Self-pay | Admitting: Family Medicine

## 2018-11-13 ENCOUNTER — Ambulatory Visit (INDEPENDENT_AMBULATORY_CARE_PROVIDER_SITE_OTHER): Payer: Medicare Other | Admitting: Family Medicine

## 2018-11-13 ENCOUNTER — Other Ambulatory Visit: Payer: Self-pay

## 2018-11-13 VITALS — BP 140/64 | HR 60 | Temp 98.3°F | Wt 195.6 lb

## 2018-11-13 DIAGNOSIS — I10 Essential (primary) hypertension: Secondary | ICD-10-CM

## 2018-11-13 DIAGNOSIS — R739 Hyperglycemia, unspecified: Secondary | ICD-10-CM | POA: Diagnosis not present

## 2018-11-13 DIAGNOSIS — R0609 Other forms of dyspnea: Secondary | ICD-10-CM

## 2018-11-13 DIAGNOSIS — I209 Angina pectoris, unspecified: Secondary | ICD-10-CM

## 2018-11-13 DIAGNOSIS — I2510 Atherosclerotic heart disease of native coronary artery without angina pectoris: Secondary | ICD-10-CM | POA: Diagnosis not present

## 2018-11-13 DIAGNOSIS — M159 Polyosteoarthritis, unspecified: Secondary | ICD-10-CM

## 2018-11-13 DIAGNOSIS — E785 Hyperlipidemia, unspecified: Secondary | ICD-10-CM | POA: Diagnosis not present

## 2018-11-13 DIAGNOSIS — M150 Primary generalized (osteo)arthritis: Secondary | ICD-10-CM

## 2018-11-13 DIAGNOSIS — N289 Disorder of kidney and ureter, unspecified: Secondary | ICD-10-CM

## 2018-11-13 DIAGNOSIS — H9193 Unspecified hearing loss, bilateral: Secondary | ICD-10-CM

## 2018-11-13 DIAGNOSIS — Z23 Encounter for immunization: Secondary | ICD-10-CM | POA: Diagnosis not present

## 2018-11-13 LAB — POC URINALSYSI DIPSTICK (AUTOMATED)
Bilirubin, UA: NEGATIVE
Blood, UA: NEGATIVE
Glucose, UA: NEGATIVE
Ketones, UA: NEGATIVE
Leukocytes, UA: NEGATIVE
Nitrite, UA: NEGATIVE
Protein, UA: NEGATIVE
Spec Grav, UA: 1.025 (ref 1.010–1.025)
Urobilinogen, UA: 0.2 E.U./dL
pH, UA: 6 (ref 5.0–8.0)

## 2018-11-13 LAB — HEPATIC FUNCTION PANEL
ALT: 22 U/L (ref 0–53)
AST: 21 U/L (ref 0–37)
Albumin: 4.8 g/dL (ref 3.5–5.2)
Alkaline Phosphatase: 45 U/L (ref 39–117)
Bilirubin, Direct: 0.1 mg/dL (ref 0.0–0.3)
Total Bilirubin: 0.7 mg/dL (ref 0.2–1.2)
Total Protein: 7.1 g/dL (ref 6.0–8.3)

## 2018-11-13 LAB — BASIC METABOLIC PANEL
BUN: 23 mg/dL (ref 6–23)
CO2: 23 mEq/L (ref 19–32)
Calcium: 9.3 mg/dL (ref 8.4–10.5)
Chloride: 108 mEq/L (ref 96–112)
Creatinine, Ser: 1.6 mg/dL — ABNORMAL HIGH (ref 0.40–1.50)
GFR: 42.82 mL/min — ABNORMAL LOW (ref >60.00)
Glucose, Bld: 100 mg/dL — ABNORMAL HIGH (ref 70–99)
Potassium: 4.5 mEq/L (ref 3.5–5.1)
Sodium: 142 mEq/L (ref 135–145)

## 2018-11-13 LAB — CBC WITH DIFFERENTIAL/PLATELET
Basophils Absolute: 0.1 10*3/uL (ref 0.0–0.1)
Basophils Relative: 1.7 % (ref 0.0–3.0)
Eosinophils Absolute: 0.1 10*3/uL (ref 0.0–0.7)
Eosinophils Relative: 1.5 % (ref 0.0–5.0)
HCT: 42.7 % (ref 39.0–52.0)
Hemoglobin: 14.4 g/dL (ref 13.0–17.0)
Lymphocytes Relative: 26.6 % (ref 12.0–46.0)
Lymphs Abs: 1.9 10*3/uL (ref 0.7–4.0)
MCHC: 33.8 g/dL (ref 30.0–36.0)
MCV: 88.6 fl (ref 78.0–100.0)
Monocytes Absolute: 0.6 10*3/uL (ref 0.1–1.0)
Monocytes Relative: 8.8 % (ref 3.0–12.0)
Neutro Abs: 4.4 10*3/uL (ref 1.4–7.7)
Neutrophils Relative %: 61.4 % (ref 43.0–77.0)
Platelets: 223 10*3/uL (ref 150.0–400.0)
RBC: 4.82 Mil/uL (ref 4.22–5.81)
RDW: 14.2 % (ref 11.5–15.5)
WBC: 7.2 10*3/uL (ref 4.0–10.5)

## 2018-11-13 LAB — LIPID PANEL
Cholesterol: 159 mg/dL (ref 0–200)
HDL: 37.6 mg/dL — ABNORMAL LOW (ref >39.00)
LDL Cholesterol: 89 mg/dL (ref 0–99)
NonHDL: 121.56
Total CHOL/HDL Ratio: 4
Triglycerides: 164 mg/dL — ABNORMAL HIGH (ref 0.0–149.0)
VLDL: 32.8 mg/dL (ref 0.0–40.0)

## 2018-11-13 LAB — TSH: TSH: 2.1 u[IU]/mL (ref 0.35–4.50)

## 2018-11-13 LAB — HEMOGLOBIN A1C: Hgb A1c MFr Bld: 6.5 % (ref 4.6–6.5)

## 2018-11-13 NOTE — Progress Notes (Signed)
Subjective:    Patient ID: [name] [name]., male    DOB: 10/22/1945, 73 y.o.   MRN: [id number]  HPI Here to follow up on some issues. His main concern today is SOB on exertion. The etiology of this remains elusive. He has seen Cardiology and his cardiac function is quite adequate. He is now seeing Dr. Melvyn Novas for a pulmonary workup. His CXR is fine, and PFT testing was normal. He now has some pulmonary stress testing coming up. He has no chest pain now. He still sees Dr. Roni Bread for a rising PSA (even though he has had a prostatectomy). They will likely set up a PET scan in the near future. He is being treated for glaucoma and he has had cataract surgeries. He is now setting up a procedure to clean the implants lens on the left. He has worn hearing aids form the [place] [facility] for some time, but he is not happy with his hearing. He wants to see a civilian provider for this.    Review of Systems  Constitutional: Negative.   HENT: Negative.   Eyes: Positive for visual disturbance.  Respiratory: Positive for shortness of breath.   Cardiovascular: Negative.   Gastrointestinal: Negative.   Genitourinary: Negative.   Musculoskeletal: Negative.   Skin: Negative.   Neurological: Negative.   Psychiatric/Behavioral: Negative.        Objective:   Physical Exam Constitutional:      General: He is not in acute distress.    Appearance: He is well-developed. He is not diaphoretic.  HENT:     Head: Normocephalic and atraumatic.     Right Ear: External ear normal.     Left Ear: External ear normal.     Nose: Nose normal.     Mouth/Throat:     Pharynx: No oropharyngeal exudate.  Eyes:     General: No scleral icterus.       Right eye: No discharge.        Left eye: No discharge.     Conjunctiva/sclera: Conjunctivae normal.     Pupils: Pupils are equal, round, and reactive to light.  Neck:     Musculoskeletal: Neck supple.     Thyroid: No thyromegaly.     Vascular: No JVD.     Trachea: No tracheal  deviation.  Cardiovascular:     Rate and Rhythm: Normal rate and regular rhythm.     Heart sounds: Normal heart sounds. No murmur. No friction rub. No gallop.   Pulmonary:     Effort: Pulmonary effort is normal. No respiratory distress.     Breath sounds: Normal breath sounds. No wheezing or rales.  Chest:     Chest wall: No tenderness.  Abdominal:     General: Bowel sounds are normal. There is no distension.     Palpations: Abdomen is soft. There is no mass.     Tenderness: There is no abdominal tenderness. There is no guarding or rebound.  Genitourinary:    Penis: No tenderness.   Musculoskeletal: Normal range of motion.        General: No tenderness.  Lymphadenopathy:     Cervical: No cervical adenopathy.  Skin:    General: Skin is warm and dry.     Coloration: Skin is not pale.     Findings: No erythema or rash.  Neurological:     Mental Status: He is alert and oriented to person, place, and time.     Cranial Nerves: No cranial nerve deficit.  Motor: No abnormal muscle tone.     Coordination: Coordination normal.     Deep Tendon Reflexes: Reflexes are normal and symmetric. Reflexes normal.  Psychiatric:        Behavior: Behavior normal.        Thought Content: Thought content normal.        Judgment: Judgment normal.           Assessment & Plan:  His HTN and CAD are stable he will have testing for SOB as noted above. He will see Dr. Roni Bread soon. We will refer him to ENT for his hearing loss. Get fasting labs today for lipids, etc.  Alysia Penna, MD

## 2018-12-04 DIAGNOSIS — H903 Sensorineural hearing loss, bilateral: Secondary | ICD-10-CM | POA: Diagnosis not present

## 2018-12-04 DIAGNOSIS — H9193 Unspecified hearing loss, bilateral: Secondary | ICD-10-CM | POA: Diagnosis not present

## 2018-12-04 DIAGNOSIS — R200 Anesthesia of skin: Secondary | ICD-10-CM | POA: Diagnosis not present

## 2018-12-06 DIAGNOSIS — Z08 Encounter for follow-up examination after completed treatment for malignant neoplasm: Secondary | ICD-10-CM | POA: Diagnosis not present

## 2018-12-06 DIAGNOSIS — D225 Melanocytic nevi of trunk: Secondary | ICD-10-CM | POA: Diagnosis not present

## 2018-12-06 DIAGNOSIS — L570 Actinic keratosis: Secondary | ICD-10-CM | POA: Diagnosis not present

## 2018-12-06 DIAGNOSIS — X32XXXA Exposure to sunlight, initial encounter: Secondary | ICD-10-CM | POA: Diagnosis not present

## 2018-12-06 DIAGNOSIS — Z1283 Encounter for screening for malignant neoplasm of skin: Secondary | ICD-10-CM | POA: Diagnosis not present

## 2018-12-06 DIAGNOSIS — L821 Other seborrheic keratosis: Secondary | ICD-10-CM | POA: Diagnosis not present

## 2018-12-06 DIAGNOSIS — Z85820 Personal history of malignant melanoma of skin: Secondary | ICD-10-CM | POA: Diagnosis not present

## 2018-12-10 ENCOUNTER — Telehealth (HOSPITAL_COMMUNITY): Payer: Self-pay | Admitting: *Deleted

## 2018-12-10 NOTE — Telephone Encounter (Signed)
Called to schedule CPX. No Answer. Left message with contact information to return call and schedule ASAP.    Landis Martins, MS, ACSM-RCEP Clinical Exercise Physiologist

## 2018-12-11 ENCOUNTER — Other Ambulatory Visit: Payer: Self-pay | Admitting: Internal Medicine

## 2018-12-19 DIAGNOSIS — C61 Malignant neoplasm of prostate: Secondary | ICD-10-CM | POA: Diagnosis not present

## 2018-12-31 DIAGNOSIS — C61 Malignant neoplasm of prostate: Secondary | ICD-10-CM | POA: Diagnosis not present

## 2018-12-31 DIAGNOSIS — N5201 Erectile dysfunction due to arterial insufficiency: Secondary | ICD-10-CM | POA: Diagnosis not present

## 2018-12-31 DIAGNOSIS — N393 Stress incontinence (female) (male): Secondary | ICD-10-CM | POA: Diagnosis not present

## 2019-01-01 ENCOUNTER — Other Ambulatory Visit (HOSPITAL_COMMUNITY): Payer: Self-pay | Admitting: Urology

## 2019-01-01 DIAGNOSIS — C61 Malignant neoplasm of prostate: Secondary | ICD-10-CM

## 2019-01-03 ENCOUNTER — Other Ambulatory Visit (HOSPITAL_COMMUNITY)
Admission: RE | Admit: 2019-01-03 | Discharge: 2019-01-03 | Disposition: A | Discharge status: Home / Self Care | Payer: Medicare Other | Source: Ambulatory Visit | Attending: Internal Medicine | Admitting: Internal Medicine

## 2019-01-03 DIAGNOSIS — Z20828 Contact with and (suspected) exposure to other viral communicable diseases: Secondary | ICD-10-CM | POA: Diagnosis not present

## 2019-01-03 DIAGNOSIS — Z01812 Encounter for preprocedural laboratory examination: Secondary | ICD-10-CM | POA: Insufficient documentation

## 2019-01-04 LAB — NOVEL CORONAVIRUS, NAA (HOSP ORDER, SEND-OUT TO REF LAB; TAT 18-24 HRS): SARS-CoV-2, NAA: NOT DETECTED

## 2019-01-04 NOTE — Progress Notes (Signed)
mychart note sent telling pt that the covid test was neg

## 2019-01-07 ENCOUNTER — Other Ambulatory Visit: Payer: Self-pay

## 2019-01-07 ENCOUNTER — Ambulatory Visit (INDEPENDENT_AMBULATORY_CARE_PROVIDER_SITE_OTHER): Payer: Medicare Other

## 2019-01-07 DIAGNOSIS — R0600 Dyspnea, unspecified: Secondary | ICD-10-CM | POA: Diagnosis not present

## 2019-01-07 DIAGNOSIS — C61 Malignant neoplasm of prostate: Secondary | ICD-10-CM | POA: Diagnosis not present

## 2019-01-07 DIAGNOSIS — R0609 Other forms of dyspnea: Secondary | ICD-10-CM

## 2019-01-08 ENCOUNTER — Ambulatory Visit (HOSPITAL_COMMUNITY)
Admission: RE | Admit: 2019-01-08 | Discharge: 2019-01-08 | Disposition: A | Discharge status: Home / Self Care | Payer: Medicare Other | Source: Ambulatory Visit | Attending: Urology | Admitting: Urology

## 2019-01-08 ENCOUNTER — Other Ambulatory Visit: Payer: Self-pay

## 2019-01-08 DIAGNOSIS — C61 Malignant neoplasm of prostate: Secondary | ICD-10-CM | POA: Diagnosis not present

## 2019-01-08 DIAGNOSIS — R0600 Dyspnea, unspecified: Secondary | ICD-10-CM | POA: Insufficient documentation

## 2019-01-08 MED ORDER — AXUMIN (FLUCICLOVINE F 18) INJECTION
10.1000 | Freq: Once | INTRAVENOUS | Status: AC
  Administered 2019-01-08: 14:00:00 10.1 via INTRAVENOUS

## 2019-01-08 NOTE — Progress Notes (Signed)
Spoke with pt and notified of results per Dr. Wert. Pt verbalized understanding and denied any questions. 

## 2019-01-16 ENCOUNTER — Encounter: Payer: Self-pay | Admitting: *Deleted

## 2019-01-17 ENCOUNTER — Ambulatory Visit (INDEPENDENT_AMBULATORY_CARE_PROVIDER_SITE_OTHER): Payer: Medicare Other | Admitting: Neurology

## 2019-01-17 ENCOUNTER — Other Ambulatory Visit: Payer: Self-pay

## 2019-01-17 ENCOUNTER — Encounter: Payer: Self-pay | Admitting: Neurology

## 2019-01-17 ENCOUNTER — Encounter

## 2019-01-17 VITALS — BP 158/84 | HR 58 | Temp 97.9°F | Ht 66.0 in | Wt 196.4 lb

## 2019-01-17 DIAGNOSIS — R202 Paresthesia of skin: Secondary | ICD-10-CM | POA: Diagnosis not present

## 2019-01-17 NOTE — Progress Notes (Signed)
PATIENT: [name] [name]. DOB: 1945/06/28  Chief Complaint  Patient presents with  . Numbness    Reports intermittent tingling in fingertips/toes and numbness on the left side of his lower lip.  Marland Kitchen PCP    [name], MD  . ENT    Helayne Seminole, MD - referring provider     HISTORICAL  [name] [name].  is a 73 year old male, seen in request by his primary care physician Dr. [name], and ENT Dr. Blenda Nicely, Est[place] Bamberg for intermittent numbness of left lower lip, bilateral fingertips, and toes.  Initial evaluation was on January 17, 2019.  I have reviewed and summarized the referring note from the referring physician.  [name] [name] a past medical history of hyperlipidemia, still enjoying swimming, at least 3 times a week, [name] [name] each time,  Around August 2018, [name] noticed difficulty breathing while swimming, also began to notice intermittent numbness involving left lower lip, bilateral fingertips, bilateral toes, but [name] denies dysarthria, denies limb muscle weakness.  Seen by cardiologist, ulnar artery disease, eventually [name] stent, but [name] still has shortness of breath, even after walking to his mailbox, or unloading groceries.  [name] was evaluated by specialist, there was no significant abnormality found.  [name] was recently also evaluated by pulmonologist, per patient, pulmonary functional test was normal.  Have a history of prostate cancer, [name] surgery in the past, PET scan in November 2020 showed focal radiotracer activity in the central prostatectomy bed, suspicious for local recurrence.  There is no evidence of pelvic lymph node or other distant metastatic disease.  [name] denies double vision, no droopy eyelid, no dysarthria, no swallowing difficulty, [name] denies limb muscle weakness.  REVIEW OF SYSTEMS: Full 14 system review of systems performed and notable only for as above All other review of systems were negative.  ALLERGIES: No Known Allergies  HOME MEDICATIONS: Current  Outpatient Medications  Medication Sig Dispense Refill  . aspirin EC 81 MG tablet Take 81 mg by mouth every evening.     . bimatoprost (LUMIGAN) 0.01 % SOLN Place 1 drop into both eyes at bedtime.    . clopidogrel (PLAVIX) 75 MG tablet Take 1 tablet (75 mg total) by mouth daily with breakfast. 90 tablet 8  . Coenzyme Q10 (COQ-10) 100 MG CAPS Take 100 mg by mouth every evening.     . dorzolamide-timolol (COSOPT) 22.3-6.8 MG/ML ophthalmic solution Place 1 drop into both eyes 2 (two) times daily.     . famotidine (PEPCID) 20 MG tablet Take 20 mg by mouth 2 (two) times daily.    . fenofibrate 160 MG tablet Take 160 mg by mouth daily.    . isosorbide mononitrate (IMDUR) 30 MG 24 hr tablet Take 1 tablet (30 mg total) by mouth daily. 90 tablet 3  . magnesium oxide (MAG-OX) 400 MG tablet Take 400 mg by mouth every evening.     . Multiple Vitamins-Minerals (MULTIVITAMIN WITH MINERALS) tablet Take 1 tablet by mouth 2 (two) times a week. Sundays and Wednesdays    . nitroGLYCERIN (NITROSTAT) 0.4 MG SL tablet Place 0.4 mg under the tongue every 5 (five) minutes as needed for chest pain.    . pantoprazole (PROTONIX) 40 MG tablet Take 1 tablet (40 mg total) by mouth daily. 90 tablet 3  . rosuvastatin (CRESTOR) 20 MG tablet Take 20 mg by mouth every evening.     Marland Kitchen telmisartan (MICARDIS) 80 MG tablet Take 1 tablet (80 mg total) by mouth daily. [place]  tablet 4   No current facility-administered medications for this visit.     PAST MEDICAL HISTORY: Past Medical History:  Diagnosis Date  . ABNORMAL EKG 10/07/2008  . ADVEF, DRUG/MED/BIOL SUBST, OTHER DRUG NOS 09/26/2006  . Agent orange exposure   . Allergy    seasonal  . Cataract    left eye-surgery 07-2015  . DEGENERATIVE JOINT DISEASE, MODERATE 04/13/2007  . DIVERTICULOSIS, COLON 04/13/2007  . ELEVATED PROSTATE SPECIFIC ANTIGEN 04/14/2007  . Glaucoma    sees [name]   . HYPERLIPIDEMIA 09/26/2006  . HYPERTENSION 04/13/2007  . JOINT EFFUSION, KNEE 06/15/2009  .  Melanoma [facility])    sees [name]   . Numbness and tingling   . PROSTATE CANCER 10/05/2009   sees [name]  . Renal insufficiency    RESOLVED PER PATIENT    PAST SURGICAL HISTORY: Past Surgical History:  Procedure Laterality Date  . ACROMIOPLASTY Right 05-06-14   per [name]   . BUNIONECTOMY     bilateral  . CATARACT EXTRACTION Left    per [name]   . COLONOSCOPY  07/10/2015   per [name], benign polyp, repeat in 10 yrs   . CORONARY STENT INTERVENTION N/A 01/17/2018   Procedure: CORONARY STENT INTERVENTION;  Surgeon: [name], MD;  Location: [facility];  Service: Cardiovascular;  Laterality: N/A;  . CYSTECTOMY     benign from hand  . GLAUCOMA SURGERY Bilateral 2014   . heart stent    . INSERTION OF MESH N/A 01/10/2013   Procedure: INSERTION OF MESH;  Surgeon: Earnstine Regal, MD;  Location: [place];  Service: General;  Laterality: N/A;  . KNEE ARTHROSCOPY Right Jan. 2013   right knee, per [name]   . KNEE ARTHROSCOPY Left 01-14-15   per [name]   . LEFT HEART CATH AND CORONARY ANGIOGRAPHY N/A 01/17/2018   Procedure: LEFT HEART CATH AND CORONARY ANGIOGRAPHY;  Surgeon: [name], MD;  Location: [facility];  Service: Cardiovascular;  Laterality: N/A;  . LEFT HEART CATH AND CORONARY ANGIOGRAPHY N/A 04/06/2018   Procedure: LEFT HEART CATH AND CORONARY ANGIOGRAPHY;  Surgeon: [name], MD;  Location: [facility];  Service: Cardiovascular;  Laterality: N/A;  . MELANOMA EXCISION  2012   per [name]  . PROSTATECTOMY     per [name]  . TONSILLECTOMY    . UMBILICAL HERNIA REPAIR  11/01/2011   Procedure: HERNIA REPAIR UMBILICAL ADULT;  Surgeon: Earnstine Regal, MD;  Location: [place] [facility];  Service: General;  Laterality: N/A;  Repair umbilical hernia with mesh patch  . VASECTOMY    . VENTRAL HERNIA REPAIR  01/10/2013   with mesh     [name]  . VENTRAL HERNIA REPAIR N/A 01/10/2013   Procedure: HERNIA REPAIR VENTRAL  ADULT ;  Surgeon: Earnstine Regal, MD;  Location: [place];  Service: General;  Laterality: N/A;    FAMILY HISTORY: Family History  Problem Relation Age of Onset  . Stroke Mother   . Diabetes Mother   . Heart failure Father   . Prostate cancer Father   . Colon cancer Neg Hx   . Colon polyps Neg Hx   . Esophageal cancer Neg Hx   . Rectal cancer Neg Hx   . Stomach cancer Neg Hx     SOCIAL HISTORY: Social History   Socioeconomic History  . Marital status: Married    Spouse name: [name] [name]  .  Number of children: 2  . Years of education: 69  . Highest education level: High school graduate  Occupational History  . Occupation: Administrator, sports    Comment: retired  Scientific laboratory technician  . Financial resource strain: Not hard at all  . Food insecurity    Worry: Never true    Inability: Never true  . Transportation needs    Medical: No    Non-medical: No  Tobacco Use  . Smoking status: Former Smoker    Packs/day: 1.00    Years: 9.00    Pack years: 9.00    Start date: 03/08/1963    Quit date: 03/07/1972    Years since quitting: 46.8  . Smokeless tobacco: Never Used  . Tobacco comment: States AAA completed   Substance and Sexual Activity  . Alcohol use: Yes    Comment: occasional  . Drug use: Never  . Sexual activity: Not on file  Lifestyle  . Physical activity    Days per week: 3 days    Minutes per session: 60 min  . Stress: Not at all  Relationships  . Social Herbalist on phone: Three times a week    Gets together: Three times a week    Attends religious service: Not on file    Active member of club or organization: Yes    Attends meetings of clubs or organizations: More than 4 times per year    Relationship status: Married  . Intimate partner violence    Fear of current or ex partner: No    Emotionally abused: No    Physically abused: No    Forced sexual activity: No  Other Topics Concern  . Not on file  Social History Narrative   Was in the [employer]; Engineer, civil (consulting)   Currently can go to the [employer] exposure   Right-handed.   2-3 cups caffeine daily,      Wife; married 40 years   2 children; grands no       05/11/2018:    Lives with wife on one level home   Enjoys going swimming at gym 3 days/week; currently enjoying cardiac rehab   Has one son, one daughter, and one granddaughter who all live in [place]. Travels to see them every other week.        PHYSICAL EXAM   Vitals:   01/17/19 0946  BP: (!) 158/84  Pulse: (!) 58  Temp: 97.9 F (36.6 C)  Weight: 196 lb 6.4 oz (89.1 kg)  Height: 5\' 6"  (1.676 m)    Not recorded      Body mass index is 31.7 kg/m.  PHYSICAL EXAMNIATION:  Gen: NAD, conversant, well nourised, well groomed                     Cardiovascular: Regular rate rhythm, no peripheral edema, warm, nontender. Eyes: Conjunctivae clear without exudates or hemorrhage Neck: Supple, no carotid bruits. Pulmonary: Clear to auscultation bilaterally   NEUROLOGICAL EXAM:  MENTAL STATUS: Speech:    Speech is normal; fluent and spontaneous with normal comprehension.  Cognition:     Orientation to time, place and person     Normal recent and remote memory     Normal Attention span and concentration     Normal Language, naming, repeating,spontaneous speech     Fund of knowledge   CRANIAL NERVES: CN II: Visual fields are full to confrontation.  Pupils are round equal and  briskly reactive to light. CN III, IV, VI: extraocular movement are normal. No ptosis. CN V: Facial sensation is intact to pinprick in all 3 divisions bilaterally. Corneal responses are intact.  CN VII: Face is symmetric with normal eye closure and smile. CN VIII: Hearing is normal to causal conversation. CN IX, X: Palate elevates symmetrically. Phonation is normal. CN XI: Head turning and shoulder shrug are intact CN XII: Tongue is midline with normal movements and no atrophy.  MOTOR: There is no pronator drift of out-stretched arms.  Muscle bulk and tone are normal. Muscle strength is normal.  REFLEXES: Reflexes are 2+ and symmetric at the biceps, triceps, knees, and ankles. Plantar responses are flexor.  SENSORY: Intact to light touch, pinprick, positional sensation and vibratory sensation are intact in fingers and toes.  COORDINATION: Rapid alternating movements and fine finger movements are intact. There is no dysmetria on finger-to-[name] and heel-knee-shin.    GAIT/STANCE: Posture is normal. Gait is steady with normal steps, base, arm swing, and turning. Heel and toe walking are normal. Tandem gait is normal.  Romberg is absent.   DIAGNOSTIC DATA (LABS, IMAGING, TESTING) - I reviewed patient records, labs, notes, testing and imaging myself where available.   ASSESSMENT AND PLAN  [name] Walczak. is a 73 y.o. male   Intermittent bilateral fingertips and toes paresthesia   MRI of cervical spine to rule out cervical myelopathy  Laboratory evaluation for potential etiology   Shortness of breath with exertion  Acetylcholine receptor antibody to rule out neuromuscular junctional disorder, CPK,  Marcial Pacas, M.D. Ph.D.  [facility] [address] [place], [place] [address] Ph: [phone] Fax: [phone]  CC: [name], MD, Helayne Seminole, MD

## 2019-01-25 LAB — VITAMIN B12: Vitamin B-12: 368 pg/mL (ref 232–1245)

## 2019-01-25 LAB — SEDIMENTATION RATE: Sed Rate: 2 mm/hr (ref 0–30)

## 2019-01-25 LAB — CK: Total CK: 90 U/L (ref 41–331)

## 2019-01-25 LAB — ACETYLCHOLINE RECEPTOR AB, ALL
AChR Binding Ab, Serum: <0.03 nmol/L (ref 0.00–0.24)
Acetylchol Block Ab: 24 % (ref 0–25)
Acetylcholine Modulat Ab: <12 % (ref 0–20)

## 2019-01-25 LAB — RPR: RPR Ser Ql: NONREACTIVE

## 2019-01-25 LAB — ANA W/REFLEX IF POSITIVE: Anti Nuclear Antibody (ANA): NEGATIVE

## 2019-01-25 LAB — VITAMIN D 25 HYDROXY (VIT D DEFICIENCY, FRACTURES): Vit D, 25-Hydroxy: 23 ng/mL — ABNORMAL LOW (ref 30.0–100.0)

## 2019-01-25 LAB — COPPER, SERUM: Copper: 73 ug/dL (ref 72–166)

## 2019-01-25 LAB — C-REACTIVE PROTEIN: CRP: <1 mg/L (ref 0–10)

## 2019-01-29 ENCOUNTER — Telehealth: Payer: Self-pay | Admitting: *Deleted

## 2019-01-29 NOTE — Telephone Encounter (Signed)
-----   Message from Penni Bombard, MD sent at 01/28/2019  6:09 PM EST ----- Unremarkable labs. Except slightly low Vit D (may start OTC vit D replacement and follow up with PCP). Continue current plan. Please call patient. -VRP

## 2019-01-29 NOTE — Telephone Encounter (Signed)
Left patient a detailed message, with lab results and vitamin D supplement recommendation, on voicemail (ok per DPR). Also, instructed him to follow up with his PCP.  Provided our number to call back with any questions.

## 2019-02-19 DIAGNOSIS — M179 Osteoarthritis of knee, unspecified: Secondary | ICD-10-CM | POA: Diagnosis not present

## 2019-02-19 DIAGNOSIS — M170 Bilateral primary osteoarthritis of knee: Secondary | ICD-10-CM | POA: Diagnosis not present

## 2019-02-19 DIAGNOSIS — M25561 Pain in right knee: Secondary | ICD-10-CM | POA: Diagnosis not present

## 2019-02-19 DIAGNOSIS — M25562 Pain in left knee: Secondary | ICD-10-CM | POA: Diagnosis not present

## 2019-03-28 DIAGNOSIS — Z23 Encounter for immunization: Secondary | ICD-10-CM | POA: Diagnosis not present

## 2019-03-30 ENCOUNTER — Other Ambulatory Visit: Payer: Self-pay | Admitting: Physician Assistant

## 2019-03-30 DIAGNOSIS — M25561 Pain in right knee: Secondary | ICD-10-CM

## 2019-04-01 ENCOUNTER — Other Ambulatory Visit: Payer: Self-pay

## 2019-04-01 ENCOUNTER — Ambulatory Visit
Admission: RE | Admit: 2019-04-01 | Discharge: 2019-04-01 | Disposition: A | Discharge status: Home / Self Care | Payer: Medicare Other | Source: Ambulatory Visit | Attending: Physician Assistant | Admitting: Physician Assistant

## 2019-04-01 DIAGNOSIS — M25461 Effusion, right knee: Secondary | ICD-10-CM | POA: Diagnosis not present

## 2019-04-01 DIAGNOSIS — M25561 Pain in right knee: Secondary | ICD-10-CM

## 2019-04-01 DIAGNOSIS — S83241A Other tear of medial meniscus, current injury, right knee, initial encounter: Secondary | ICD-10-CM | POA: Diagnosis not present

## 2019-04-09 DIAGNOSIS — M25561 Pain in right knee: Secondary | ICD-10-CM | POA: Diagnosis not present

## 2019-04-09 DIAGNOSIS — M1711 Unilateral primary osteoarthritis, right knee: Secondary | ICD-10-CM | POA: Diagnosis not present

## 2019-04-10 ENCOUNTER — Encounter: Payer: Self-pay | Admitting: Internal Medicine

## 2019-04-10 ENCOUNTER — Other Ambulatory Visit: Payer: Self-pay

## 2019-04-10 ENCOUNTER — Telehealth: Payer: Self-pay | Admitting: *Deleted

## 2019-04-10 ENCOUNTER — Ambulatory Visit (INDEPENDENT_AMBULATORY_CARE_PROVIDER_SITE_OTHER): Payer: Medicare Other | Admitting: Internal Medicine

## 2019-04-10 DIAGNOSIS — R0600 Dyspnea, unspecified: Secondary | ICD-10-CM | POA: Diagnosis not present

## 2019-04-10 DIAGNOSIS — R0609 Other forms of dyspnea: Secondary | ICD-10-CM

## 2019-04-10 NOTE — Progress Notes (Signed)
[name] [name]., male    DOB: [birth year]     MRN: [id number]   Brief patient profile:  65  yowm quit smoking 1974 with new onset doe/hoarseness  assoc chest tightness  starting in summer 2019  > cardiac eval > stent Jan 04 2018 > resumed same activity = swimming > same symptoms > then also began having similar sitting in lazyboy assoc with dysphagia so  eval by [name] [name] in feb 2020 rec acid suppression which helped some of the pressure and all the belching o/w no better ex tol so referred to pulmonary clinic 08/08/2018 by Dr  [name] [name] in spring of 2019 =  194    History of Present Illness  08/08/2018  Pulmonary/ 1st office eval/[name] [name]  Chief Complaint  Patient presents with  . Pulmonary Consult    Referred by [name] [name] for DOE.   Dyspnea:  slt hill to mb x 200 ft gets chest tight and sob most times/ same problem with steps  Cough:  Day > noct  Dry assoc hoarseness  Sleep: prone /flat bed /one skinny pillow SABA use: none ppi helped belching but not cp,sob rec Continue prilosec and pepcid as you are  GERD  Stop lisinopril and start micardis 80 mg (telmisartan) one daily     09/19/2018  f/u ov/[name] [name] re: unexplained sob  Chief Complaint  Patient presents with  . Shortness of Breath    Not better since last visit. Did not see any difference in taking Micardis.  Dyspnea:  Sitting in lazy boy still having symptoms 2-3 x per week of chest tight   Tends to happen after supper and   better p belching  Cough: none, loses breath sometimes when talking  Sleeping: flat bed one pillow  SABA use: none  02: none  rec Continue prilosec 40 mg Take 30-60 min before first meal of the day and take pepcid 20 mg right after supper for at least a month to see if you can eliminate the symptoms in the lazy boy and if not you'll need to see Dr [name] [name]  To get the most out of exercise, you need to be continuously aware that you are short of breath    04/10/2019  f/u ov/[name] [name] re: unexplained sob  With nl  cpst x elevated bp Chief Complaint  Patient presents with  . Follow-up    Breathing is unchanged since the last visit.   Dyspnea:  Doe carrying groceries / dragging trash cans, limited from reg ex by R knee Cough: none  Sleeping: bed is flat one pillow no more noct spells, not needing lazy boy  SABA use: none  02: none    No obvious day to day or daytime variability or assoc excess/ purulent sputum or mucus plugs or hemoptysis or cp or chest tightness, subjective wheeze or overt sinus or hb symptoms.   Sleeping fine now  without nocturnal  or early am exacerbation  of respiratory  c/o's or need for noct saba. Also denies any obvious fluctuation of symptoms with weather or environmental changes or other aggravating or alleviating factors except as outlined above   No unusual exposure hx or h/o childhood pna/ asthma or knowledge of premature birth.  Current Allergies, Complete Past Medical History, Past Surgical History, Family History, and Social History were reviewed in [employer] record.  ROS  The following are not active complaints unless bolded Hoarseness, sore throat, dysphagia, dental problems, itching, sneezing,  nasal congestion  or discharge of excess mucus or purulent secretions, ear ache,   fever, chills, sweats, unintended wt loss or wt gain, classically pleuritic or exertional cp,  orthopnea pnd or arm/hand swelling  or leg swelling, presyncope, palpitations, abdominal pain, anorexia, nausea, vomiting, diarrhea  or change in bowel habits or change in bladder habits, change in stools or change in urine, dysuria, hematuria,  rash, arthralgias, visual complaints, headache, numbness, weakness or ataxia or problems with walking or coordination,  change in mood or  memory.        Current Meds  Medication Sig  . aspirin EC 81 MG tablet Take 81 mg by mouth every evening.   . bimatoprost (LUMIGAN) 0.01 % SOLN Place 1 drop into both eyes at bedtime.  . clopidogrel  (PLAVIX) 75 MG tablet Take 1 tablet (75 mg total) by mouth daily with breakfast.  . Coenzyme Q10 (COQ-10) 100 MG CAPS Take 100 mg by mouth every evening.   . dorzolamide-timolol (COSOPT) 22.3-6.8 MG/ML ophthalmic solution Place 1 drop into both eyes 2 (two) times daily.   . famotidine (PEPCID) 20 MG tablet Take 20 mg by mouth 2 (two) times daily.  . fenofibrate 160 MG tablet Take 160 mg by mouth daily.  . isosorbide mononitrate (IMDUR) 30 MG 24 hr tablet Take 1 tablet (30 mg total) by mouth daily.  . magnesium oxide (MAG-OX) 400 MG tablet Take 400 mg by mouth every evening.   . Multiple Vitamins-Minerals (MULTIVITAMIN WITH MINERALS) tablet Take 1 tablet by mouth 2 (two) times a week. Sundays and Wednesdays  . nitroGLYCERIN (NITROSTAT) 0.4 MG SL tablet Place 0.4 mg under the tongue every 5 (five) minutes as needed for chest pain.  . pantoprazole (PROTONIX) 40 MG tablet Take 1 tablet (40 mg total) by mouth daily.  . rosuvastatin (CRESTOR) 20 MG tablet Take 20 mg by mouth every evening.   . telmisartan (MICARDIS) 80 MG tablet Take 1 tablet (80 mg total) by mouth daily.            Past Medical History:  Diagnosis Date  . ABNORMAL EKG 10/07/2008  . ADVEF, DRUG/MED/BIOL SUBST, OTHER DRUG NOS 09/26/2006  . Allergy    seasonal  . Cataract    left eye-surgery 07-2015  . DEGENERATIVE JOINT DISEASE, MODERATE 04/13/2007  . DIVERTICULOSIS, COLON 04/13/2007  . ELEVATED PROSTATE SPECIFIC ANTIGEN 04/14/2007  . Glaucoma    sees Dr. Groat   . HYPERLIPIDEMIA 09/26/2006  . HYPERTENSION 04/13/2007  . JOINT EFFUSION, KNEE 06/15/2009  . Melanoma (HCC)    sees Dr. John Hall   . PROSTATE CANCER 10/05/2009   sees Dr. Wrenn  . Renal insufficiency    RESOLVED PER PATIENT      Objective:     amb wm nad   Vital signs reviewed  04/10/2019  - Note at rest 02 sats  99% on RA     04/10/2019          19 3  09/19/18 198 lb (89.8 kg)  08/08/18 198 lb 6.4 oz (90 kg)  08/01/18 190 lb (86.2 kg)         HEENT : pt  wearing mask not removed for exam due to covid -19 concerns.    NECK :  without JVD/Nodes/TM/ nl carotid upstrokes bilaterally   LUNGS: no acc muscle use,  Nl contour chest which is clear to A and P bilaterally without cough on insp or exp maneuvers   CV:  RRR  no s3 or murmur or increase in P2,  and no edema   ABD:  soft and nontender with nl inspiratory excursion in the supine position. No bruits or organomegaly appreciated, bowel sounds nl  MS:  Nl gait/ ext warm without deformities, calf tenderness, cyanosis or clubbing No obvious joint restrictions   SKIN: warm and dry without lesions    NEURO:  alert, approp, nl sensorium with  no motor or cerebellar deficits apparent.           Assessment

## 2019-04-10 NOTE — Telephone Encounter (Signed)
   Primary Cardiologist: Jenkins Rouge, MD  Chart reviewed as part of pre-operative protocol coverage. Simple dental extractions are considered low risk procedures per guidelines and generally do not require any specific cardiac clearance. It is also generally accepted that for simple extractions and dental cleanings, there is no need to interrupt blood thinner therapy.   SBE prophylaxis is not required for the patient.  I will route this recommendation to the requesting party via Epic fax function and remove from pre-op pool.  Please call with questions.  Darreld Mclean, PA-C 04/10/2019, 10:18 AM

## 2019-04-10 NOTE — Telephone Encounter (Signed)
   [facility] Pre-operative Risk Assessment    Request for surgical clearance:  1. What type of surgery is being performed? MAXILLARY 2ND MOLAR TO BE EXTRACTED   2. When is this surgery scheduled? TBD   3. What type of clearance is required (medical clearance vs. Pharmacy clearance to hold med vs. Both)? MEDICAL  4. Are there any medications that need to be held prior to surgery and how long? PLAVIX ; DDS IS ASKING WHEN TO HOLD AND WHEN TO RESUME  5. Practice name and name of physician performing surgery? [facility]; Gwendolyn Fill, DDS TO PREFORM PROCEDURE   6. What is your office phone number [phone]   7.   What is your office fax number [phone]  8.   Anesthesia type (None, local, MAC, general) ? NONE LISTED   Julaine Hua 04/10/2019, 9:59 AM  _________________________________________________________________   (provider comments below)

## 2019-04-10 NOTE — Patient Instructions (Signed)
To get the most out of exercise, you need to be continuously aware that you are short of breath, but never out of breath, for 30 minutes daily. As you improve, it will actually be easier for you to do the same amount of exercise  in  30 minutes so always push to the level where you are short of breath.     You may need additional blood pressure medications based on your cpst  - amlodipine would be a good choice     If you are satisfied with your treatment plan,  let your doctor know and he/she can either refill your medications or you can return here when your prescription runs out.     If in any way you are not 100% satisfied,  please tell us.  If 100% better, tell your friends!  Pulmonary follow up is as needed

## 2019-04-11 ENCOUNTER — Encounter: Payer: Self-pay | Admitting: Internal Medicine

## 2019-04-11 NOTE — Assessment & Plan Note (Signed)
Onset summer 2019  - PFT's  08/08/2018  FEV1 3.31 (119 % ) ratio 0.76  p 6 % improvement from saba p nothing prior to study with DLCO  115 % corrects to 102 % for alv volume  And erv 37% - LHC 04/06/18  Nl lv/ LVEDP = 10 - Echo 06/22/18 nl  - 08/08/2018   Walked RA  2 laps @  approx 245ft each @ fast pace  stopped due to  End of study, min sob, sats fine - 08/08/2018 trial off acei   - 09/19/2018   Walked RA  2 laps @  approx 252ft each @ brisk  pace  stopped due to  End of study, sats 96% min sob   - 01/07/2019 CPST wnl though systolic bp > [id number] at end    Reviewed all studies to date, referred back to PCP re options to keep systolic down with exertion eg amlodipine    Also reviewed reconditioning exercises he can do once he has his knee surgery = goal is submax ex x > 30 min daily ideally.          Each maintenance medication was reviewed in detail including emphasizing most importantly the difference between maintenance and prns and under what circumstances the prns are to be triggered using an action plan format where appropriate.  Total time for H and P, chart review, counseling,   and generating customized AVS unique to this office visit / charting = 20 min

## 2019-04-18 ENCOUNTER — Other Ambulatory Visit: Payer: Self-pay

## 2019-04-18 ENCOUNTER — Telehealth: Payer: Self-pay | Admitting: Neurology

## 2019-04-18 ENCOUNTER — Encounter: Payer: Self-pay | Admitting: Neurology

## 2019-04-18 ENCOUNTER — Ambulatory Visit (INDEPENDENT_AMBULATORY_CARE_PROVIDER_SITE_OTHER): Payer: Medicare Other | Admitting: Neurology

## 2019-04-18 VITALS — BP 158/83 | HR 58 | Temp 97.6°F | Ht 66.0 in | Wt 193.0 lb

## 2019-04-18 DIAGNOSIS — R202 Paresthesia of skin: Secondary | ICD-10-CM

## 2019-04-18 NOTE — Telephone Encounter (Signed)
Please make sure he is on schedule for MRI of cervical spine

## 2019-04-18 NOTE — Progress Notes (Signed)
PATIENT: [name] [name]. DOB: 12-Dec-1945  Chief Complaint  Patient presents with  . Numbness    Reports some improvement in his symptoms in his fingertips and toes. He notices that it gets worse if he is sitting in a hot tub. He has not had his cervical MRI yet.     HISTORICAL  [name] [name].  is a 74 year old male, seen in request by his primary care physician [name], Ishmael Holter, and ENT Dr. Blenda Nicely, Estill Bamberg for intermittent numbness of left lower lip, bilateral fingertips, and toes.  Initial evaluation was on January 17, 2019.  I have reviewed and summarized the referring note from the referring physician.  He had a past medical history of hyperlipidemia, still enjoying swimming, at least 3 times a week, one mile each time,  Around August 2018, he noticed difficulty breathing while swimming, also began to notice intermittent numbness involving left lower lip, bilateral fingertips, bilateral toes, but he denies dysarthria, denies limb muscle weakness.  Seen by cardiologist, was diagnosed with coronary artery disease, eventually had stent, but he still has shortness of breath, even after walking to his mailbox, or unloading groceries.  He was evaluated by specialist, there was no significant abnormality found.  He was recently also evaluated by pulmonologist, per patient, pulmonary functional test was normal.  He had a history of prostate cancer, had surgery in the past, PET scan in November 2020 showed focal radiotracer activity in the central prostatectomy bed, suspicious for local recurrence.  There is no evidence of pelvic lymph node or other distant metastatic disease.  He denies double vision, no droopy eyelid, no dysarthria, no swallowing difficulty, he denies limb muscle weakness.  UPDATE Apr 18 2019: He is overall doing very well, no gait abnormality, reported normal stress test recently, he has pending dental work, is a candidate for right knee replacement, continue  complains of intermittent bilateral fingertips and toes numbness, no pain, has urinary urgency, he contributed to his previous prostate cancer treatment, no difficulty control his bowel movement  I reviewed laboratory evaluation November 2020, negative acetylcholine receptor antibody, CPK, ESR, C-reactive protein, ANA, RPR, copper, B12, vitamin D was 23, A1c was 6.5, normal TSH, CBC, elevated creatinine 1.6, GFR of 42, LDL 89, triglyceride 164,  REVIEW OF SYSTEMS: Full 14 system review of systems performed and notable only for as above All other review of systems were negative.  ALLERGIES: No Known Allergies  HOME MEDICATIONS: Current Outpatient Medications  Medication Sig Dispense Refill  . aspirin EC 81 MG tablet Take 81 mg by mouth every evening.     . bimatoprost (LUMIGAN) 0.01 % SOLN Place 1 drop into both eyes at bedtime.    . clopidogrel (PLAVIX) 75 MG tablet Take 1 tablet (75 mg total) by mouth daily with breakfast. 90 tablet 8  . Coenzyme Q10 (COQ-10) 100 MG CAPS Take 100 mg by mouth every evening.     . dorzolamide-timolol (COSOPT) 22.3-6.8 MG/ML ophthalmic solution Place 1 drop into both eyes 2 (two) times daily.     . famotidine (PEPCID) 20 MG tablet Take 20 mg by mouth 2 (two) times daily.    . fenofibrate 160 MG tablet Take 160 mg by mouth daily.    . isosorbide mononitrate (IMDUR) 30 MG 24 hr tablet Take 1 tablet (30 mg total) by mouth daily. 90 tablet 3  . magnesium oxide (MAG-OX) 400 MG tablet Take 400 mg by mouth every evening.     . Multiple  Vitamins-Minerals (MULTIVITAMIN WITH MINERALS) tablet Take 1 tablet by mouth 2 (two) times a week. Sundays and Wednesdays    . nitroGLYCERIN (NITROSTAT) 0.4 MG SL tablet Place 0.4 mg under the tongue every 5 (five) minutes as needed for chest pain.    . pantoprazole (PROTONIX) 40 MG tablet Take 1 tablet (40 mg total) by mouth daily. 90 tablet 3  . rosuvastatin (CRESTOR) 20 MG tablet Take 20 mg by mouth every evening.     Marland Kitchen telmisartan  (MICARDIS) 80 MG tablet Take 1 tablet (80 mg total) by mouth daily. 90 tablet 4   No current facility-administered medications for this visit.    PAST MEDICAL HISTORY: Past Medical History:  Diagnosis Date  . ABNORMAL EKG 10/07/2008  . ADVEF, DRUG/MED/BIOL SUBST, OTHER DRUG NOS 09/26/2006  . Agent orange exposure   . Allergy    seasonal  . Cataract    left eye-surgery 07-2015  . DEGENERATIVE JOINT DISEASE, MODERATE 04/13/2007  . DIVERTICULOSIS, COLON 04/13/2007  . ELEVATED PROSTATE SPECIFIC ANTIGEN 04/14/2007  . Glaucoma    sees [name]   . HYPERLIPIDEMIA 09/26/2006  . HYPERTENSION 04/13/2007  . JOINT EFFUSION, KNEE 06/15/2009  . Melanoma [facility])    sees [name]   . Numbness and tingling   . PROSTATE CANCER 10/05/2009   sees [name]  . Renal insufficiency    RESOLVED PER PATIENT    PAST SURGICAL HISTORY: Past Surgical History:  Procedure Laterality Date  . ACROMIOPLASTY Right 05-06-14   per [name]   . BUNIONECTOMY     bilateral  . CATARACT EXTRACTION Left    per [name]   . COLONOSCOPY  07/10/2015   per [name], benign polyp, repeat in 10 yrs   . CORONARY STENT INTERVENTION N/A 01/17/2018   Procedure: CORONARY STENT INTERVENTION;  Surgeon: [name], MD;  Location: [place] [facility];  Service: Cardiovascular;  Laterality: N/A;  . CYSTECTOMY     benign from hand  . GLAUCOMA SURGERY Bilateral 2014   . heart stent    . INSERTION OF MESH N/A 01/10/2013   Procedure: INSERTION OF MESH;  Surgeon: Earnstine Regal, MD;  Location: [place];  Service: General;  Laterality: N/A;  . KNEE ARTHROSCOPY Right Jan. 2013   right knee, per [name]   . KNEE ARTHROSCOPY Left 01-14-15   per [name]   . LEFT HEART CATH AND CORONARY ANGIOGRAPHY N/A 01/17/2018   Procedure: LEFT HEART CATH AND CORONARY ANGIOGRAPHY;  Surgeon: [name], MD;  Location: [place] [facility];  Service: Cardiovascular;  Laterality: N/A;  . LEFT HEART CATH AND CORONARY ANGIOGRAPHY N/A 04/06/2018    Procedure: LEFT HEART CATH AND CORONARY ANGIOGRAPHY;  Surgeon: [name], MD;  Location: [place] [facility];  Service: Cardiovascular;  Laterality: N/A;  . MELANOMA EXCISION  2012   per [name]  . PROSTATECTOMY     per [name]  . TONSILLECTOMY    . UMBILICAL HERNIA REPAIR  11/01/2011   Procedure: HERNIA REPAIR UMBILICAL ADULT;  Surgeon: Earnstine Regal, MD;  Location: [place];  Service: General;  Laterality: N/A;  Repair umbilical hernia with mesh patch  . VASECTOMY    . VENTRAL HERNIA REPAIR  01/10/2013   with mesh     [name]  . VENTRAL HERNIA REPAIR N/A 01/10/2013   Procedure: HERNIA REPAIR VENTRAL ADULT ;  Surgeon: Earnstine Regal, MD;  Location: [place];  Service:  General;  Laterality: N/A;    FAMILY HISTORY: Family History  Problem Relation Age of Onset  . Stroke Mother   . Diabetes Mother   . Heart failure Father   . Prostate cancer Father   . Colon cancer Neg Hx   . Colon polyps Neg Hx   . Esophageal cancer Neg Hx   . Rectal cancer Neg Hx   . Stomach cancer Neg Hx     SOCIAL HISTORY: Social History   Socioeconomic History  . Marital status: Married    Spouse name: Charlett Nose  . Number of children: 2  . Years of education: 60  . Highest education level: High school graduate  Occupational History  . Occupation: Administrator, sports    Comment: retired  Tobacco Use  . Smoking status: Former Smoker    Packs/day: 1.00    Years: 9.00    Pack years: 9.00    Start date: 03/08/1963    Quit date: 03/07/1972    Years since quitting: 47.1  . Smokeless tobacco: Never Used  . Tobacco comment: States AAA completed   Substance and Sexual Activity  . Alcohol use: Yes    Comment: occasional  . Drug use: Never  . Sexual activity: Not on file  Other Topics Concern  . Not on file  Social History Narrative   Was in the [employer]; Office manager   Currently can go to the [employer] exposure   Right-handed.   2-3 cups caffeine daily,      Wife;  married 40 years   2 children; grands no       05/11/2018:    Lives with wife on one level home   Enjoys going swimming at gym 3 days/week; currently enjoying cardiac rehab   Has one son, one daughter, and one granddaughter who all live in [place]. Travels to see them every other week.      Social Determinants of Health   Financial Resource Strain: Low Risk   . Difficulty of Paying Living Expenses: Not hard at all  Food Insecurity: No Food Insecurity  . Worried About Charity fundraiser in the Last Year: Never true  . Ran Out of Food in the Last Year: Never true  Transportation Needs: No Transportation Needs  . Lack of Transportation (Medical): No  . Lack of Transportation (Non-Medical): No  Physical Activity: Sufficiently Active  . Days of Exercise per Week: 3 days  . Minutes of Exercise per Session: 60 min  Stress: No Stress Concern Present  . Feeling of Stress : Not at all  Social Connections: Unknown  . Frequency of Communication with Friends and Family: Three times a week  . Frequency of Social Gatherings with Friends and Family: Three times a week  . Attends Religious Services: Not on file  . Active Member of Clubs or Organizations: Yes  . Attends Archivist Meetings: More than 4 times per year  . Marital Status: Married  Human resources officer Violence: Not At Risk  . Fear of Current or Ex-Partner: No  . Emotionally Abused: No  . Physically Abused: No  . Sexually Abused: No     PHYSICAL EXAM   Vitals:   04/18/19 1007  BP: (!) 158/83  Pulse: (!) 58  Temp: 97.6 F (36.4 C)  Weight: 193 lb (87.5 kg)  Height: '5\' 6"'  (1.676 m)    Not recorded      Body mass index is 31.15 kg/m.  PHYSICAL EXAMNIATION:  Gen: NAD, conversant, well nourised, well groomed                     Cardiovascular: Regular rate rhythm, no peripheral edema, warm, nontender. Eyes: Conjunctivae clear without exudates or hemorrhage Neck: Supple, no carotid bruits. Pulmonary: Clear to  auscultation bilaterally   NEUROLOGICAL EXAM:  MENTAL STATUS: Speech:    Speech is normal; fluent and spontaneous with normal comprehension.  Cognition:     Orientation to time, place and person     Normal recent and remote memory     Normal Attention span and concentration     Normal Language, naming, repeating,spontaneous speech     Fund of knowledge   CRANIAL NERVES: CN II: Visual fields are full to confrontation.  Pupils are round equal and briskly reactive to light. CN III, IV, VI: extraocular movement are normal. No ptosis. CN V: Facial sensation is intact to pinprick in all 3 divisions bilaterally. Corneal responses are intact.  CN VII: Face is symmetric with normal eye closure and smile. CN VIII: Hearing is normal to causal conversation. CN IX, X: Palate elevates symmetrically. Phonation is normal. CN XI: Head turning and shoulder shrug are intact CN XII: Tongue is midline with normal movements and no atrophy.  MOTOR: There is no pronator drift of out-stretched arms. Muscle bulk and tone are normal. Muscle strength is normal.  REFLEXES: Reflexes are 2+ and symmetric at the biceps, triceps, knees, and trace at ankles. Plantar responses are flexor.  SENSORY: Mildly length dependent decreased light touch vibratory sensation to ankle level,.  COORDINATION: Rapid alternating movements and fine finger movements are intact. There is no dysmetria on finger-to-nose and heel-knee-shin.    GAIT/STANCE: Posture is normal. Gait is steady with normal steps, base, arm swing, and turning. Heel and toe walking are normal. Tandem gait is normal.  Romberg is absent.   DIAGNOSTIC DATA (LABS, IMAGING, TESTING) - I reviewed patient records, labs, notes, testing and imaging myself where available.   ASSESSMENT AND PLAN  [name] Stern. is a 74 y.o. male   Intermittent bilateral fingertips and toes paresthesia  Need to rule out peripheral neuropathy, EMG nerve conduction  study  MRI of cervical spine to rule out cervical myelopathy due to hyperreflexia of bilateral knee reflex      Marcial Pacas, M.D. Ph.D.  [facility] [address], [place], [place] [address] Ph: [phone] Fax: [phone]  CC: [name], MD, Helayne Seminole, MD

## 2019-04-18 NOTE — Telephone Encounter (Signed)
Noted, order sent to GI they will reach out to the patient to schedule.

## 2019-04-19 ENCOUNTER — Telehealth: Payer: Self-pay | Admitting: *Deleted

## 2019-04-19 NOTE — Telephone Encounter (Signed)
Primary Cardiologist:[name] Johnsie Cancel, MD  Chart reviewed as part of pre-operative protocol coverage. Because of [name] NOETZEL Jr.'s past medical history and time since last visit, he/she will require a follow-up visit in order to better assess preoperative cardiovascular risk.  Pre-op covering staff: - Please schedule appointment and call patient to inform them. - Please contact requesting surgeon's office via preferred method (i.e, phone, fax) to inform them of need for appointment prior to surgery.  If applicable, this message will also be routed to pharmacy pool and/or primary cardiologist for input on holding anticoagulant/antiplatelet agent as requested below so that this information is available at time of patient's appointment.   [name] [name]. [facility] [address] Office [phone] Fax [phone]

## 2019-04-19 NOTE — Telephone Encounter (Signed)
NOTIFIED REQUESTING OFFICE VIA Epic FAX FUNCTION-PT NEEDS APPT FOR CARDIAC CLEARANCE  Pt has scheduled appt for f/u 05-01-19 with Dr Johnsie Cancel. Called wife (DPR) she states that pt will keep scheduled appt for follow up and discuss pre-op clearance at that time. Added to appt notes to remind office to discuss at follow up appt.

## 2019-04-19 NOTE — Telephone Encounter (Signed)
   [facility] Pre-operative Risk Assessment    Request for surgical clearance:  1. What type of surgery is being performed? RIGHT TOTAL KNEE ARTHROPLASTY   2. When is this surgery scheduled? TBD   3. What type of clearance is required (medical clearance vs. Pharmacy clearance to hold med vs. Both)? MEDICAL  4. Are there any medications that need to be held prior to surgery and how long? ASA AND PLAVIX   5. Practice name and name of physician performing surgery? [facility]; DR. Remo Lipps NORRIS   6. What is your office phone number [phone]    7.   What is your office fax number [phone] ATTN: ASHLEY HITLON  8.   Anesthesia type (None, local, MAC, general) ? SPINAL   [name] [name] 04/19/2019, 8:56 AM  _________________________________________________________________   (provider comments below)

## 2019-04-23 DIAGNOSIS — Z9889 Other specified postprocedural states: Secondary | ICD-10-CM | POA: Diagnosis not present

## 2019-04-23 DIAGNOSIS — Z961 Presence of intraocular lens: Secondary | ICD-10-CM | POA: Diagnosis not present

## 2019-04-23 DIAGNOSIS — H25811 Combined forms of age-related cataract, right eye: Secondary | ICD-10-CM | POA: Diagnosis not present

## 2019-04-23 DIAGNOSIS — H401131 Primary open-angle glaucoma, bilateral, mild stage: Secondary | ICD-10-CM | POA: Diagnosis not present

## 2019-04-23 DIAGNOSIS — H26492 Other secondary cataract, left eye: Secondary | ICD-10-CM | POA: Diagnosis not present

## 2019-04-29 DIAGNOSIS — H25811 Combined forms of age-related cataract, right eye: Secondary | ICD-10-CM | POA: Diagnosis not present

## 2019-04-29 NOTE — Progress Notes (Signed)
CARDIOLOGY OFFICE NOTE  Date:  05/01/2019    [name] [name]. Date of Birth: 12-May-1945 Medical Record [id number]  PCP:  [name], MD  Cardiologist:  Johnsie Cancel  No chief complaint on file.   History of Present Illness: [name] [name]. is a 74 y.o. male  has a history of HTN, HLD, DM and CAD. He had prior stenting of the LCX in November of 2019. Did have recurrent pain post intervention and was placed on Imdur. Follow up Myoview 02/2018 non ischemic with EF of 60%. Ended up having repeat cath 03/2018 - to manage medically. Has had myalgias on statin - dose has been decreased. Has had chronic dyspnea since his original PCI. This does not appear to be cardiac in nature with Normal CPD 01/07/19 Needs right TKR GERD better on protonix   Having dental issues that are delaying TKR Needs new crowns Having cataract out next week Having some neuropathic pain in toes and fingers   The patient does not have symptoms concerning for COVID-19 infection (fever, chills, cough, or new shortness of breath).    Past Medical History:  Diagnosis Date  . ABNORMAL EKG 10/07/2008  . ADVEF, DRUG/MED/BIOL SUBST, OTHER DRUG NOS 09/26/2006  . Agent orange exposure   . Allergy    seasonal  . Cataract    left eye-surgery 07-2015  . DEGENERATIVE JOINT DISEASE, MODERATE 04/13/2007  . DIVERTICULOSIS, COLON 04/13/2007  . ELEVATED PROSTATE SPECIFIC ANTIGEN 04/14/2007  . Glaucoma    sees [name]   . HYPERLIPIDEMIA 09/26/2006  . HYPERTENSION 04/13/2007  . JOINT EFFUSION, KNEE 06/15/2009  . Melanoma [facility])    sees [name]   . Numbness and tingling   . PROSTATE CANCER 10/05/2009   sees [name]  . Renal insufficiency    RESOLVED PER PATIENT    Past Surgical History:  Procedure Laterality Date  . ACROMIOPLASTY Right 05-06-14   per [name]   . BUNIONECTOMY     bilateral  . CATARACT EXTRACTION Left    per [name]   . COLONOSCOPY  07/10/2015   per [name], benign polyp, repeat in 10 yrs    . CORONARY STENT INTERVENTION N/A 01/17/2018   Procedure: CORONARY STENT INTERVENTION;  Surgeon: [name], MD;  Location: [place] [facility];  Service: Cardiovascular;  Laterality: N/A;  . CYSTECTOMY     benign from hand  . GLAUCOMA SURGERY Bilateral 2014   . heart stent    . INSERTION OF MESH N/A 01/10/2013   Procedure: INSERTION OF MESH;  Surgeon: Earnstine Regal, MD;  Location: [place];  Service: General;  Laterality: N/A;  . KNEE ARTHROSCOPY Right Jan. 2013   right knee, per [name]   . KNEE ARTHROSCOPY Left 01-14-15   per [name]   . LEFT HEART CATH AND CORONARY ANGIOGRAPHY N/A 01/17/2018   Procedure: LEFT HEART CATH AND CORONARY ANGIOGRAPHY;  Surgeon: [name], MD;  Location: [place] [facility];  Service: Cardiovascular;  Laterality: N/A;  . LEFT HEART CATH AND CORONARY ANGIOGRAPHY N/A 04/06/2018   Procedure: LEFT HEART CATH AND CORONARY ANGIOGRAPHY;  Surgeon: [name], MD;  Location: [place] [facility];  Service: Cardiovascular;  Laterality: N/A;  . MELANOMA EXCISION  2012   per [name]  . PROSTATECTOMY     per [name]  . TONSILLECTOMY    . UMBILICAL HERNIA REPAIR  11/01/2011   Procedure: HERNIA REPAIR UMBILICAL  ADULT;  Surgeon: Earnstine Regal, MD;  Location: [place];  Service: General;  Laterality: N/A;  Repair umbilical hernia with mesh patch  . VASECTOMY    . VENTRAL HERNIA REPAIR  01/10/2013   with mesh     [name]  . VENTRAL HERNIA REPAIR N/A 01/10/2013   Procedure: HERNIA REPAIR VENTRAL ADULT ;  Surgeon: Earnstine Regal, MD;  Location: [place];  Service: General;  Laterality: N/A;     Medications: Current Meds  Medication Sig  . aspirin EC 81 MG tablet Take 81 mg by mouth every evening.   . bimatoprost (LUMIGAN) 0.01 % SOLN Place 1 drop into both eyes at bedtime.  . clopidogrel (PLAVIX) 75 MG tablet Take 1 tablet (75 mg total) by mouth daily with breakfast.  . Coenzyme Q10 (COQ-10) 100 MG CAPS Take 100 mg by mouth every  evening.   . dorzolamide-timolol (COSOPT) 22.3-6.8 MG/ML ophthalmic solution Place 1 drop into both eyes 2 (two) times daily.   . famotidine (PEPCID) 20 MG tablet Take 20 mg by mouth 2 (two) times daily.  . fenofibrate 160 MG tablet Take 160 mg by mouth daily.  . isosorbide mononitrate (IMDUR) 30 MG 24 hr tablet Take 1 tablet (30 mg total) by mouth daily.  . magnesium oxide (MAG-OX) 400 MG tablet Take 400 mg by mouth every evening.   . Multiple Vitamins-Minerals (MULTIVITAMIN WITH MINERALS) tablet Take 1 tablet by mouth 2 (two) times a week. Sundays and Wednesdays  . nitroGLYCERIN (NITROSTAT) 0.4 MG SL tablet Place 0.4 mg under the tongue every 5 (five) minutes as needed for chest pain.  . pantoprazole (PROTONIX) 40 MG tablet Take 1 tablet (40 mg total) by mouth daily.  . rosuvastatin (CRESTOR) 20 MG tablet Take 20 mg by mouth every evening.   Marland Kitchen telmisartan (MICARDIS) 80 MG tablet Take 1 tablet (80 mg total) by mouth daily.     Allergies: No Known Allergies  Social History: The patient  reports that he quit smoking about 47 years ago. He started smoking about 56 years ago. He has a 9.00 pack-year smoking history. He has never used smokeless tobacco. He reports current alcohol use. He reports that he does not use drugs.   Family History: The patient's family history includes Diabetes in his mother; Heart failure in his father; Prostate cancer in his father; Stroke in his mother.   Review of Systems: Please see the history of present illness.   All other systems are reviewed and negative.   Physical Exam: VS:  BP 130/82   Pulse (!) 50   Ht 5\' 6"  (1.676 m)   Wt 191 lb (86.6 kg)   SpO2 98%   BMI 30.83 kg/m  .  BMI Body mass index is 30.83 kg/m.  Wt Readings from Last 3 Encounters:  05/01/19 191 lb (86.6 kg)  04/18/19 193 lb (87.5 kg)  04/10/19 193 lb 9.6 oz (87.8 kg)   Affect appropriate Healthy:  appears stated age 83: normal Neck supple with no adenopathy JVP normal no  bruits no thyromegaly Lungs clear with no wheezing and good diaphragmatic motion Heart:  S1/S2 no murmur, no rub, gallop or click PMI normal Abdomen: benighn, BS positve, no tenderness, no AAA no bruit.  No HSM or HJR Distal pulses intact with no bruits No edema Neuro non-focal Skin warm and dry Arthritis in right knee     LABORATORY DATA:  EKG:  05/01/19 SR rate 60 normal   Lab Results  Component Value Date   WBC 7.2 11/13/2018   HGB 14.4 11/13/2018   HCT 42.7 11/13/2018   PLT 223.0 11/13/2018   GLUCOSE 100 (H) 11/13/2018   CHOL 159 11/13/2018   TRIG 164.0 (H) 11/13/2018   HDL 37.60 (L) 11/13/2018   LDLDIRECT 82.1 04/07/2011   LDLCALC 89 11/13/2018   ALT 22 11/13/2018   AST 21 11/13/2018   NA 142 11/13/2018   K 4.5 11/13/2018   CL 108 11/13/2018   CREATININE 1.60 (H) 11/13/2018   BUN 23 11/13/2018   CO2 23 11/13/2018   TSH 2.10 11/13/2018   PSA 0.38 11/06/2015   HGBA1C 6.5 11/13/2018     BNP (last 3 results) No results for input(s): BNP in the last 8760 hours.  ProBNP (last 3 results) No results for input(s): PROBNP in the last 8760 hours.   Other Studies Reviewed Today:  Cath 04/06/18  Conclusion    Widely patent mid circumflex drug-eluting stent. The circumflex beyond the stent is unchanged from before with a 50 to 70% distal bifurcation stenosis in a [place] 110 configuration. The circumflex is otherwise patent.  Left main is normal.  LAD is widely patent. A branch of the first diagonal contains ostial 80% narrowing. This tertiary branches are small.  RCA is widely patent with distal PDA and LV branch diffuse disease.  Normal LV function and LVEDP 10 mmHg.  RECOMMENDATIONS:   It is conceivable that angina is due to the very distal bifurcation disease in the circumflex. It does not appear to be angiographically severe. Stent implantation would include crossing over an equally large side branch. Considering the stability, complexity of  an interventional procedure (with risk of side branch occlusion), and the far distal location, recommend continued conservative medical management possibly adding either Ranexa or beta-blocker therapy to the regimen.     ASSESSMENT AND PLAN:  1. CAD: 01/17/18 stent to the prox to mid Cx Single vessel disease with good result. persistent symptoms F/U myovue done 03/01/18 low risk with no ischemia EF 60% continue medical Rx  Repeat Cath 04/06/18 with widely patent stent with medical Rx recommended for other   2. Pre-operative:  Ok to proceed with right TKR anytime   3. Hypertension: Well controlled.  Continue current medications and low sodium Dash type diet.     4. HLD:  continue statin labs with primary   5. GERD -  Low carb diet protonix   6. DM -  Discussed low carb diet.  Target hemoglobin A1c is 6.5 or less.  Continue current medications.   7. Chronic dyspnea -  CPX 01/07/19 with no cardiopulmonary limitations Stopped due to ventilatory limit due to body habitus   8. Obesity - see above.   9. COVID-19 Education: The signs and symptoms of COVID-19 were discussed with the patient and how to seek care for testing (follow up with PCP or arrange E-visit).  The importance of social distancing, staying at home, hand hygiene and wearing a mask when out in public were discussed today.  Current medicines are reviewed with the patient today.  The patient does not have concerns regarding medicines other than what has been noted above.  The following changes have been made:  See above.  Labs/ tests ordered today include:   No orders of the defined types were placed in this encounter.    Disposition:   FU in a year .   Signed: Jenkins Rouge, MD  05/01/2019 9:51 AM  [place]  [address] [place], [place]  [address] Phone: [phone] Fax: [phone]

## 2019-05-01 ENCOUNTER — Ambulatory Visit (INDEPENDENT_AMBULATORY_CARE_PROVIDER_SITE_OTHER): Payer: Medicare Other | Admitting: Cardiovascular Disease

## 2019-05-01 ENCOUNTER — Encounter: Payer: Self-pay | Admitting: Cardiovascular Disease

## 2019-05-01 ENCOUNTER — Other Ambulatory Visit: Payer: Self-pay

## 2019-05-01 VITALS — BP 130/82 | HR 50 | Ht 66.0 in | Wt 191.0 lb

## 2019-05-01 DIAGNOSIS — I2510 Atherosclerotic heart disease of native coronary artery without angina pectoris: Secondary | ICD-10-CM | POA: Diagnosis not present

## 2019-05-01 NOTE — Patient Instructions (Signed)
Medication Instructions:   *If you need a refill on your cardiac medications before your next appointment, please call your pharmacy*  Lab Work:  If you have labs (blood work) drawn today and your tests are completely normal, you will receive your results only by: Marland Kitchen MyChart Message (if you have MyChart) OR . A paper copy in the mail If you have any lab test that is abnormal or we need to change your treatment, we will call you to review the results.  Follow-Up: At [facility], you and your health needs are our priority.  As part of our continuing mission to provide you with exceptional heart care, we have created designated Provider Care Teams.  These Care Teams include your primary Cardiologist (physician) and Advanced Practice Providers (APPs -  Physician Assistants and Nurse Practitioners) who all work together to provide you with the care you need, when you need it.  Your next appointment:   1 year(s)  The format for your next appointment:   In Person  Provider:   You may see Jenkins Rouge, MD or one of the following Advanced Practice Providers on your designated Care Team:    Truitt Merle, NP  Cecilie Kicks, NP  Kathyrn Drown, NP

## 2019-05-03 ENCOUNTER — Other Ambulatory Visit: Payer: Medicare Other

## 2019-05-03 DIAGNOSIS — Z23 Encounter for immunization: Secondary | ICD-10-CM | POA: Diagnosis not present

## 2019-05-09 DIAGNOSIS — H2511 Age-related nuclear cataract, right eye: Secondary | ICD-10-CM | POA: Diagnosis not present

## 2019-05-14 ENCOUNTER — Telehealth (INDEPENDENT_AMBULATORY_CARE_PROVIDER_SITE_OTHER): Payer: Medicare Other | Admitting: Family Medicine

## 2019-05-14 ENCOUNTER — Other Ambulatory Visit: Payer: Self-pay

## 2019-05-14 ENCOUNTER — Ambulatory Visit: Payer: Medicare Other

## 2019-05-14 VITALS — BP 137/77 | Wt 191.0 lb

## 2019-05-14 DIAGNOSIS — Z0000 Encounter for general adult medical examination without abnormal findings: Secondary | ICD-10-CM | POA: Diagnosis not present

## 2019-05-14 DIAGNOSIS — E669 Obesity, unspecified: Secondary | ICD-10-CM

## 2019-05-14 DIAGNOSIS — I2510 Atherosclerotic heart disease of native coronary artery without angina pectoris: Secondary | ICD-10-CM | POA: Diagnosis not present

## 2019-05-14 DIAGNOSIS — R739 Hyperglycemia, unspecified: Secondary | ICD-10-CM | POA: Diagnosis not present

## 2019-05-14 NOTE — Patient Instructions (Addendum)
  [name] , Thank you for taking time to come for your Medicare Wellness Visit. I appreciate your ongoing commitment to your health goals. Please review the following plan we discussed and let me know if I can assist you in the future.   These are the goals we discussed:   We recommend the following healthy lifestyle for LIFE:  Small portions. But, make sure to get regular (at least 3 per day), healthy meals and small healthy snacks if needed.  Eat a healthy clean diet.   TRY TO EAT: -at least 5-7 servings of low sugar, colorful, and nutrient rich vegetables per day (not corn, potatoes or bananas.) -berries are the best choice if you wish to eat fruit (only eat small amounts if trying to reduce weight)  -lean meets (fish, white meat of chicken or [place]) -vegan proteins for some meals - beans or tofu, whole grains, nuts and seeds -Replace bad fats with good fats - good fats include: fish, nuts and seeds, canola oil, olive oil -small amounts of low fat or non fat dairy -small amounts of100 % whole grains - check the lables -drink plenty of water  AVOID: -SUGAR, sweets, anything with added sugar, corn syrup or sweeteners - must read labels as even foods advertised as "healthy" often are loaded with sugar -if you must have a sweetener, small amounts of stevia may be best -sweetened beverages and artificially sweetened beverages -simple starches (rice, bread, potatoes, pasta, chips, etc - small amounts of 100% whole grains are ok) -red meat, pork, butter -fried foods, fast food, processed food, excessive dairy, eggs and coconut.  Get at least 150 minutes of sweaty aerobic exercise per week. For now can work on chair exercises, light workouts with hand weights for upper body, core and ab workouts - YouTube has lots of free video workouts. Could also consider working with Physiological scientist (outside with masks due to [place] pandemic)to develop some safe routines that are chair and mat based  and safe for the knees.     This is a list of the screening recommended for you and due dates:  Health Maintenance  Topic Date Due  . Hemoglobin A1C  Request at appointment with [name]  . Eye exam for diabetics  04/22/2020  . Colon Cancer Screening  07/19/2025  . Tetanus Vaccine  02/09/2028  . Flu Shot  Completed  . Pneumonia vaccines  Completed  . Complete foot exam   Discontinued

## 2019-05-14 NOTE — Progress Notes (Signed)
Medicare Annual Preventive Care Visit  (initial annual wellness or annual wellness exam)  Virtual Visit via Video Note  I connected with [name] [name] by a video enabled telemedicine application and verified that I am speaking with the correct person using two identifiers. They are unable to use the video, so visit was completed in audio only.  Location patient: home Location provider:work or home office Persons participating in the virtual visit: patient, provider, wife  Concerns and/or follow up today:  [name] Stasiak. is a pleasant 74 year old with a PMH HTN, coronary atherosclerosis and hyperlipidemia seen for his annual medicare visit. He sees cardiologist for management of his CV health. He did his labs in September which included a Hgba1c that was borderline and lipids, BMP and cbc. Rarely drinks alcohol. Does not smoke - quit in 1974. No regular exercise - waiting on knee replacement with [name]. Has to complete some dental work first. Denies depression. Has hearing aides and has seen ENT and audiology about hearing issues.   See HM section in Epic for other details of completed HM. See scanned documentation under Media Tab for further documentation HPI, health risk assessment. See Media Tab and Care Teams sections in Epic for other providers.  ROS: negative for report of fevers, unintentional weight loss, vision changes - better cataract removal, vision loss, chest pain, sob, hemoptysis, melena, hematochezia, hematuria, genital discharge or lesions, falls, bleeding or bruising, loc, thoughts of suicide or self harm, memory loss  1.) Patient-completed health risk assessment  - completed and reviewed, see scanned documentation  2.) Review of Medical History: -PMH, PSH, Family History and current specialty and care providers reviewed and updated and listed below  - see scanned in document in chart and below Patient Care Team: [name], MD as PCP - General Josue Hector, MD  as PCP - Cardiology (Cardiology) Irine Seal, MD as Attending Physician (Urology) [facility] Milus Banister, MD as Attending Physician (Gastroenterology) Helayne Seminole, MD as Consulting Physician (Otolaryngology) [name] - ortho  Past Medical History:  Diagnosis Date  . ABNORMAL EKG 10/07/2008  . ADVEF, DRUG/MED/BIOL SUBST, OTHER DRUG NOS 09/26/2006  . Agent orange exposure   . Allergy    seasonal  . Cataract    left eye-surgery 07-2015  . DEGENERATIVE JOINT DISEASE, MODERATE 04/13/2007  . DIVERTICULOSIS, COLON 04/13/2007  . ELEVATED PROSTATE SPECIFIC ANTIGEN 04/14/2007  . Glaucoma    sees [name]   . HYPERLIPIDEMIA 09/26/2006  . HYPERTENSION 04/13/2007  . JOINT EFFUSION, KNEE 06/15/2009  . Melanoma [facility])    sees [name]   . Numbness and tingling   . PROSTATE CANCER 10/05/2009   sees [name]  . Renal insufficiency    RESOLVED PER PATIENT    Past Surgical History:  Procedure Laterality Date  . ACROMIOPLASTY Right 05-06-14   per [name]   . BUNIONECTOMY     bilateral  . CATARACT EXTRACTION Left    per [name]   . COLONOSCOPY  07/10/2015   per [name], benign polyp, repeat in 10 yrs   . CORONARY STENT INTERVENTION N/A 01/17/2018   Procedure: CORONARY STENT INTERVENTION;  Surgeon: [name], MD;  Location: [place] [facility];  Service: Cardiovascular;  Laterality: N/A;  . CYSTECTOMY     benign from hand  . GLAUCOMA SURGERY Bilateral 2014   . heart stent    . INSERTION OF MESH N/A 01/10/2013   Procedure: INSERTION  OF MESH;  Surgeon: Earnstine Regal, MD;  Location: [place];  Service: General;  Laterality: N/A;  . KNEE ARTHROSCOPY Right Jan. 2013   right knee, per [name]   . KNEE ARTHROSCOPY Left 01-14-15   per [name]   . LEFT HEART CATH AND CORONARY ANGIOGRAPHY N/A 01/17/2018   Procedure: LEFT HEART CATH AND CORONARY ANGIOGRAPHY;  Surgeon: [name], MD;  Location: [place] [facility];  Service: Cardiovascular;   Laterality: N/A;  . LEFT HEART CATH AND CORONARY ANGIOGRAPHY N/A 04/06/2018   Procedure: LEFT HEART CATH AND CORONARY ANGIOGRAPHY;  Surgeon: [name], MD;  Location: [place] [facility];  Service: Cardiovascular;  Laterality: N/A;  . MELANOMA EXCISION  2012   per [name]  . PROSTATECTOMY     per [name]  . TONSILLECTOMY    . UMBILICAL HERNIA REPAIR  11/01/2011   Procedure: HERNIA REPAIR UMBILICAL ADULT;  Surgeon: Earnstine Regal, MD;  Location: [place];  Service: General;  Laterality: N/A;  Repair umbilical hernia with mesh patch  . VASECTOMY    . VENTRAL HERNIA REPAIR  01/10/2013   with mesh     [name]  . VENTRAL HERNIA REPAIR N/A 01/10/2013   Procedure: HERNIA REPAIR VENTRAL ADULT ;  Surgeon: Earnstine Regal, MD;  Location: [place];  Service: General;  Laterality: N/A;    Social History   Socioeconomic History  . Marital status: Married    Spouse name: Charlett Nose  . Number of children: 2  . Years of education: 16  . Highest education level: High school graduate  Occupational History  . Occupation: Administrator, sports    Comment: retired  Tobacco Use  . Smoking status: Former Smoker    Packs/day: 1.00    Years: 9.00    Pack years: 9.00    Start date: 03/08/1963    Quit date: 03/07/1972    Years since quitting: 47.2  . Smokeless tobacco: Never Used  . Tobacco comment: States AAA completed   Substance and Sexual Activity  . Alcohol use: Yes    Comment: occasional  . Drug use: Never  . Sexual activity: Not on file  Other Topics Concern  . Not on file  Social History Narrative   Was in the [employer]; Office manager   Currently can go to the [employer] exposure   Right-handed.   2-3 cups caffeine daily,      Wife; married 40 years   2 children; grands no       05/11/2018:    Lives with wife on one level home   Enjoys going swimming at gym 3 days/week; currently enjoying cardiac rehab   Has one son, one daughter, and one granddaughter who  all live in [place]. Travels to see them every other week.      Social Determinants of Health   Financial Resource Strain:   . Difficulty of Paying Living Expenses: Not on file  Food Insecurity:   . Worried About Charity fundraiser in the Last Year: Not on file  . Ran Out of Food in the Last Year: Not on file  Transportation Needs:   . Lack of Transportation (Medical): Not on file  . Lack of Transportation (Non-Medical): Not on file  Physical Activity:   . Days of Exercise per Week: Not on file  . Minutes of Exercise per Session: Not on file  Stress:   . Feeling  of Stress : Not on file  Social Connections:   . Frequency of Communication with Friends and Family: Not on file  . Frequency of Social Gatherings with Friends and Family: Not on file  . Attends Religious Services: Not on file  . Active Member of Clubs or Organizations: Not on file  . Attends Archivist Meetings: Not on file  . Marital Status: Not on file  Intimate Partner Violence:   . Fear of Current or Ex-Partner: Not on file  . Emotionally Abused: Not on file  . Physically Abused: Not on file  . Sexually Abused: Not on file    Family History  Problem Relation Age of Onset  . Stroke Mother   . Diabetes Mother   . Heart failure Father   . Prostate cancer Father   . Colon cancer Neg Hx   . Colon polyps Neg Hx   . Esophageal cancer Neg Hx   . Rectal cancer Neg Hx   . Stomach cancer Neg Hx     Current Outpatient Medications on File Prior to Visit  Medication Sig Dispense Refill  . aspirin EC 81 MG tablet Take 81 mg by mouth every evening.     Marland Kitchen Besifloxacin HCl (BESIVANCE OP) Apply to eye.    . bimatoprost (LUMIGAN) 0.01 % SOLN Place 1 drop into both eyes at bedtime.    . clopidogrel (PLAVIX) 75 MG tablet Take 1 tablet (75 mg total) by mouth daily with breakfast. 90 tablet 8  . Coenzyme Q10 (COQ-10) 100 MG CAPS Take 100 mg by mouth every evening.     . dorzolamide-timolol (COSOPT) 22.3-6.8 MG/ML  ophthalmic solution Place 1 drop into both eyes 2 (two) times daily.     . famotidine (PEPCID) 20 MG tablet Take 20 mg by mouth 2 (two) times daily.    . fenofibrate 160 MG tablet Take 160 mg by mouth daily.    . magnesium oxide (MAG-OX) 400 MG tablet Take 400 mg by mouth every evening.     . Multiple Vitamins-Minerals (MULTIVITAMIN WITH MINERALS) tablet Take 1 tablet by mouth 2 (two) times a week. Sundays and Wednesdays    . nitroGLYCERIN (NITROSTAT) 0.4 MG SL tablet Place 0.4 mg under the tongue every 5 (five) minutes as needed for chest pain.    . pantoprazole (PROTONIX) 40 MG tablet Take 1 tablet (40 mg total) by mouth daily. 90 tablet 3  . rosuvastatin (CRESTOR) 20 MG tablet Take 20 mg by mouth every evening.     Marland Kitchen telmisartan (MICARDIS) 80 MG tablet Take 1 tablet (80 mg total) by mouth daily. 90 tablet 4  . isosorbide mononitrate (IMDUR) 30 MG 24 hr tablet Take 1 tablet (30 mg total) by mouth daily. 90 tablet 3   No current facility-administered medications on file prior to visit.     3.) Review of functional ability and level of safety:  Any difficulty hearing?  See scanned documentation - has hearing aides from the [place].   History of falling?  See scanned documentation. None.  Any trouble with IADLs - using a phone, using transportation, grocery shopping, preparing meals, doing housework, doing laundry, taking medications and managing money?  See scanned documentation - reports no issues with any of these  Advance Directives?  Discussed briefly and offered more resources and detailed discussion with our trained staff. Working on this - has the Teacher, adult education and is working on completing. Denies questions or concerns.   See summary of recommendations in Patient  Instructions below.  4.) Physical Exam Vitals:   05/14/19 1010  BP: 137/77   Estimated body mass index is 30.83 kg/m as calculated from the following:   Height as of 05/01/19: 5\' 6"  (1.676 m).   Weight as  of this encounter: 191 lb (86.6 kg).  EKG (optional): deferred  VITALS per patient if applicable:  GENERAL: alert, oriented, appears well and in no acute distress; visual acuity grossly intact, full vision exam deferred due to pandemic and/or virtual encounter  HEENT: atraumatic, conjunttiva clear, no obvious abnormalities on inspection of external nose and ears  NECK: normal movements of the head and neck  LUNGS: on inspection no signs of respiratory distress, breathing rate appears normal, no obvious gross SOB, gasping or wheezing  CV: no obvious cyanosis  MS: moves all visible extremities without noticeable abnormality  PSYCH/NEURO: pleasant and cooperative, no obvious depression or anxiety, speech and thought processing grossly intact, Cognitive function grossly intact  Depression screen [facility] 2/9 05/14/2019 05/11/2018 03/28/2018 05/09/2017 05/09/2017  Decreased Interest 0 0 0 0 0  Down, Depressed, Hopeless 0 0 0 0 0  PHQ - 2 Score 0 0 0 0 0  Altered sleeping - 0 - - -  Tired, decreased energy - 0 - - -  Change in appetite - 0 - - -  Feeling bad or failure about yourself  - 0 - - -  Trouble concentrating - 0 - - -  Moving slowly or fidgety/restless - 0 - - -  Suicidal thoughts - 0 - - -  PHQ-9 Score - 0 - - -  Some recent data might be hidden     See patient instructions for recommendations.  Education and counseling regarding the above review of health provided with a plan for the following: -see scanned patient completed form for further details -fall prevention strategies discussed - he has bad knees but denies any weakness or giving away, he is being careful, caution with steps and uneven surfaces, up and down -healthy lifestyle discussed - discussed core and chair exercises, discussed healthy diet - advised low sugar whole foods, DASH and Mediterreanean diets are good. -importance and resources for completing advanced directives discussed -see patient instructions below for  any other recommendations provided  4)The following written screening schedule of preventive measures were reviewed with assessment and plan made per below, orders and patient instructions:         Alcohol screening done     Obesity Screening and counseling done     STI screening (Hep C if born 1943-65) offered and per pt wishes     Tobacco Screening done I do not prescribe controlled pain medications for this patient.       Pneumococcal (PPSV23 -one dose after 64, one before if risk factors), influenza yearly and hepatitis B vaccines (if high risk - end stage renal disease, IV drugs, homosexual men, live in home for mentally retarded, hemophilia receiving factors) ASSESSMENT/PLAN: done if applicable      Prostate cancer screening ASSESSMENT/PLAN: n/a, declined - see urologist, has hx of prostate removal, sees urologist every 6 month      Colorectal cancer screening (FOBT yearly or flex sig q4y or colonoscopy q10y or barium enema q4y) ASSESSMENT/PLAN: utd or ordered per Epic      Diabetes outpatient self-management training services ASSESSMENT/PLAN: n/a      Screening for glaucoma(q1y if high risk - diabetes, FH, AA and > 50 or hispanic and > 65) ASSESSMENT/PLAN: utd or advised - sees  opthomologist      Medical nutritional therapy for individuals with diabetes or renal disease ASSESSMENT/PLAN: see orders, N/a      Cardiovascular screening blood tests (lipids q5y) ASSESSMENT/PLAN: see orders and labs - done in the last 1 year      Diabetes screening tests ASSESSMENT/PLAN: see orders and labs - done in the last 1 year   7.) Summary:   Medicare annual wellness visit, subsequent -risk factors and conditions per above assessment were discussed and treatment, recommendations and referrals were offered per documentation above and orders and patient instructions. -he had his COVID19 vaccines, counseling on prevention discussed -discussed shingles vaccine - he plans to ask at [facility] as  is free there, thinks he had the old vaccine only  Hyperglycemia/Obesity -diabetes not on problem list nor on PMH, however, borderline Hgba1c last check. Let pt know and offered to recheck. He reports he will see his PCP in office in about a week and will request a check then. Advised healthy low sugar whole foods based diet (discussed at length). They do most cooking at home. Discussed good choices and portion sizes. Discussed DASH and Mediterranean diets as guides for healthy eating. Discussed chair exercises, light arm weights, ab/core workouts and Youtube video options for truncal and upper body workouts he could do despite waiting on knee surgery.   There are no Patient Instructions on file for this visit.  I'll copy this note to his PCP. Lucretia Kern, DO

## 2019-05-17 ENCOUNTER — Other Ambulatory Visit: Payer: Self-pay

## 2019-05-20 ENCOUNTER — Encounter: Payer: Self-pay | Admitting: Family Medicine

## 2019-05-20 ENCOUNTER — Other Ambulatory Visit: Payer: Self-pay

## 2019-05-20 ENCOUNTER — Ambulatory Visit (INDEPENDENT_AMBULATORY_CARE_PROVIDER_SITE_OTHER): Payer: Medicare Other | Admitting: Family Medicine

## 2019-05-20 VITALS — BP 142/78 | HR 51 | Temp 97.8°F | Ht 66.0 in | Wt 193.2 lb

## 2019-05-20 DIAGNOSIS — M8949 Other hypertrophic osteoarthropathy, multiple sites: Secondary | ICD-10-CM

## 2019-05-20 DIAGNOSIS — I2510 Atherosclerotic heart disease of native coronary artery without angina pectoris: Secondary | ICD-10-CM

## 2019-05-20 DIAGNOSIS — I10 Essential (primary) hypertension: Secondary | ICD-10-CM | POA: Diagnosis not present

## 2019-05-20 DIAGNOSIS — R0600 Dyspnea, unspecified: Secondary | ICD-10-CM | POA: Diagnosis not present

## 2019-05-20 DIAGNOSIS — N1832 Chronic kidney disease, stage 3b: Secondary | ICD-10-CM

## 2019-05-20 DIAGNOSIS — R739 Hyperglycemia, unspecified: Secondary | ICD-10-CM | POA: Diagnosis not present

## 2019-05-20 DIAGNOSIS — R0609 Other forms of dyspnea: Secondary | ICD-10-CM

## 2019-05-20 DIAGNOSIS — N183 Chronic kidney disease, stage 3 unspecified: Secondary | ICD-10-CM | POA: Insufficient documentation

## 2019-05-20 DIAGNOSIS — E785 Hyperlipidemia, unspecified: Secondary | ICD-10-CM | POA: Diagnosis not present

## 2019-05-20 DIAGNOSIS — M159 Polyosteoarthritis, unspecified: Secondary | ICD-10-CM

## 2019-05-20 LAB — HEPATIC FUNCTION PANEL
ALT: 18 U/L (ref 0–53)
AST: 17 U/L (ref 0–37)
Albumin: 4.4 g/dL (ref 3.5–5.2)
Alkaline Phosphatase: 34 U/L — ABNORMAL LOW (ref 39–117)
Bilirubin, Direct: 0.1 mg/dL (ref 0.0–0.3)
Total Bilirubin: 0.6 mg/dL (ref 0.2–1.2)
Total Protein: 6.7 g/dL (ref 6.0–8.3)

## 2019-05-20 LAB — BASIC METABOLIC PANEL
BUN: 23 mg/dL (ref 6–23)
CO2: 26 mEq/L (ref 19–32)
Calcium: 9.6 mg/dL (ref 8.4–10.5)
Chloride: 111 mEq/L (ref 96–112)
Creatinine, Ser: 1.59 mg/dL — ABNORMAL HIGH (ref 0.40–1.50)
GFR: 43.07 mL/min — ABNORMAL LOW (ref >60.00)
Glucose, Bld: 120 mg/dL — ABNORMAL HIGH (ref 70–99)
Potassium: 5.1 mEq/L (ref 3.5–5.1)
Sodium: 144 mEq/L (ref 135–145)

## 2019-05-20 LAB — LIPID PANEL
Cholesterol: 166 mg/dL (ref 0–200)
HDL: 42.3 mg/dL (ref >39.00)
LDL Cholesterol: 90 mg/dL (ref 0–99)
NonHDL: 124.02
Total CHOL/HDL Ratio: 4
Triglycerides: 172 mg/dL — ABNORMAL HIGH (ref 0.0–149.0)
VLDL: 34.4 mg/dL (ref 0.0–40.0)

## 2019-05-20 LAB — HEMOGLOBIN A1C: Hgb A1c MFr Bld: 6.2 % (ref 4.6–6.5)

## 2019-05-20 NOTE — Patient Instructions (Signed)
Health Maintenance Due  Topic Date Due  . HEMOGLOBIN A1C  05/13/2019

## 2019-05-20 NOTE — Progress Notes (Signed)
   Subjective:    Patient ID: Royce Scheider., male    DOB: 12-Apr-1945, 74 y.o.   MRN: [id number]  HPI Here to follow up on issues. He has had a lot of dental problems lately and he is seeing a new dentist in [place]. He has some crowns that need to be replaced, he needs an extraction, and he needs a root canal. He saw ENT to check his hearing aids recently, and they said they were working fine. He sees Dr. Roni Bread and his PSA has crept up to 1.24. he recently had a PET scan and there was some activity seen in the pelvis, but Dr. Roni Bread thinks this is confined to some lymph nodes. He is seeing [name] for his right knee, and they are planning a total arthroscopy sometime this year. He had the pulmonary stress test with Dr. Melvyn Novas and this came out normal, so Dr. Melvyn Novas has released him from his care for the SOB. Finally he recently saw a primary care provider at the [place] who did lab work, and this showed his creatinine has risen to 1.8 and the GFR has fallen to 37.    Review of Systems  Constitutional: Negative.   HENT: Positive for dental problem.   Respiratory: Positive for shortness of breath. Negative for cough and wheezing.   Cardiovascular: Negative.   Gastrointestinal: Negative.   Genitourinary: Negative.   Musculoskeletal: Positive for arthralgias.       Objective:   Physical Exam Constitutional:      Appearance: Normal appearance.  Cardiovascular:     Rate and Rhythm: Normal rate and regular rhythm.     Pulses: Normal pulses.     Heart sounds: Normal heart sounds.  Pulmonary:     Effort: Pulmonary effort is normal.     Breath sounds: Normal breath sounds.  Lymphadenopathy:     Cervical: No cervical adenopathy.  Neurological:     General: No focal deficit present.     Mental Status: He is alert and oriented to person, place, and time.           Assessment & Plan:  His HTN and CAD are stable. His hearing loss is stable. He has progressed to stage 3 CKD, so we will refer  him to Nephrology to follow with [place]. He will get the dental work done. We will get fasting labs to day to check lipids, A1c, etc.  Alysia Penna, MD

## 2019-05-21 ENCOUNTER — Ambulatory Visit
Admission: RE | Admit: 2019-05-21 | Discharge: 2019-05-21 | Disposition: A | Discharge status: Home / Self Care | Payer: Medicare Other | Source: Ambulatory Visit | Attending: Neurology | Admitting: Neurology

## 2019-05-21 DIAGNOSIS — R202 Paresthesia of skin: Secondary | ICD-10-CM

## 2019-05-22 ENCOUNTER — Telehealth: Payer: Self-pay | Admitting: Neurology

## 2019-05-22 NOTE — Telephone Encounter (Addendum)
Please call patient, MRI of cervical spine showed multilevel degenerative changes, most severe at C5-6, with evidence of bilateral foraminal narrowing, no evidence of spinal cord compression  I will review films at his next follow-up visit MRI cervical spine (without) demonstrating: - At C5-6, C6-7: disc bulging and uncovertebral joint hypertrophy with mild spinal stenosis and severe biforaminal stenosis.

## 2019-05-22 NOTE — Telephone Encounter (Signed)
I spoke to the patient and informed him of the cervical MRI results below. He will keep his pending NCV/EMG appt on 05/29/19 for further discussion.

## 2019-05-29 ENCOUNTER — Other Ambulatory Visit: Payer: Self-pay

## 2019-05-29 ENCOUNTER — Ambulatory Visit (INDEPENDENT_AMBULATORY_CARE_PROVIDER_SITE_OTHER): Payer: Medicare Other | Admitting: Neurology

## 2019-05-29 DIAGNOSIS — M25561 Pain in right knee: Secondary | ICD-10-CM

## 2019-05-29 DIAGNOSIS — R202 Paresthesia of skin: Secondary | ICD-10-CM

## 2019-05-29 DIAGNOSIS — I2510 Atherosclerotic heart disease of native coronary artery without angina pectoris: Secondary | ICD-10-CM | POA: Diagnosis not present

## 2019-05-29 DIAGNOSIS — G8929 Other chronic pain: Secondary | ICD-10-CM | POA: Diagnosis not present

## 2019-05-29 DIAGNOSIS — R200 Anesthesia of skin: Secondary | ICD-10-CM

## 2019-05-29 NOTE — Procedures (Signed)
Full Name: [name] [name] Gender: Male MRN #: [id number] Date of Birth: February 24, 1946    Visit Date: 05/29/2019 07:39 Age: 74 Years Examining Physician: Marcial Pacas, MD  Referring Physician: Marcial Pacas, MD History: 74 year old male with intermittent bilateral fingertips and toes paresthesia  Summary of the tests:  Nerve conduction study:  Bilateral sural, superficial peroneal, median, left ulnar sensory responses were normal  Bilateral peroneal to EDB, right tibial motor responses were normal.  Left tibial motor responses showed slightly decreased CMAP amplitude was otherwise normal.  Left median, ulnar motor responses were normal.  Electromyography:  Selected needle examination of right upper and lower extremities muscles were normal.  Conclusion: This is essentially a normal study, there is no electrodiagnostic evidence of large fiber peripheral neuropathy, or right cervical and lumbar sacral radiculopathy.    ------------------------------- Marcial Pacas, M.D. PhD  [facility] [address] [place], [place] [address] Tel: [phone] Fax: [phone]         [facility]    Nerve / Sites Muscle Latency Ref. Amplitude Ref. Rel Amp Segments Distance Velocity Ref. Area    ms ms mV mV %  cm m/s m/s mVms  L Median - APB     Wrist APB 3.6 ?4.4 7.6 ?4.0 100 Wrist - APB 7   27.3     Upper arm APB 7.3  7.4  97.6 Upper arm - Wrist 20 54 ?49 26.4  L Ulnar - ADM     Wrist ADM 3.0 ?3.3 8.2 ?6.0 100 Wrist - ADM 7   29.6     B.Elbow ADM 6.2  8.4  103 B.Elbow - Wrist 18 57 ?49 28.8     A.Elbow ADM 8.1  8.0  94.7 A.Elbow - B.Elbow 10 51 ?49 28.3  L Peroneal - EDB     Ankle EDB 6.2 ?6.5 4.5 ?2.0 100 Ankle - EDB 9   13.6     Fib head EDB 12.8  3.9  85.7 Fib head - Ankle 29 44 ?44 11.5     Pop fossa EDB 15.0  3.7  95.1 Pop fossa - Fib head 10 44 ?44 12.1         Pop fossa - Ankle      R Peroneal - EDB     Ankle EDB 4.8 ?6.5 5.4 ?2.0 100 Ankle - EDB 9   18.1    Fib head EDB 11.1  5.2  94.8 Fib head - Ankle 28 45 ?44 17.4     Pop fossa EDB 13.4  5.0  97.6 Pop fossa - Fib head 10 44 ?44 17.1         Pop fossa - Ankle      L Tibial - AH     Ankle AH 3.9 ?5.8 3.5 ?4.0 100 Ankle - AH 9   18.1     Pop fossa AH 12.9  2.9  82.5 Pop fossa - Ankle 37 41 ?41 17.7  R Tibial - AH     Ankle AH 4.3 ?5.8 4.9 ?4.0 100 Ankle - AH 9   15.4     Pop fossa AH 13.0  4.5  91.7 Pop fossa - Ankle 36 41 ?41 21.5                    SNC    Nerve / Sites Rec. Site Peak Lat Ref.  Amp Ref. Segments Distance Peak Diff Ref.    ms ms V V  cm ms ms  L Sural - Ankle (Calf)     Calf Ankle 4.4 ?4.4 12 ?6 Calf - Ankle 14    R Sural - Ankle (Calf)     Calf Ankle 4.1 ?4.4 11 ?6 Calf - Ankle 14    L Superficial peroneal - Ankle     Lat leg Ankle 4.0 ?4.4 6 ?6 Lat leg - Ankle 14    R Superficial peroneal - Ankle     Lat leg Ankle 4.1 ?4.4 6 ?6 Lat leg - Ankle 14    L Median, Ulnar - Transcarpal comparison     Median Palm Wrist 2.1 ?2.2 38 ?35 Median Palm - Wrist 8       Ulnar Palm Wrist 2.1 ?2.2 9 ?12 Ulnar Palm - Wrist 8          Median Palm - Ulnar Palm  -0.0 ?0.4  L Median - Orthodromic (Dig II, Mid palm)     Dig II Wrist 2.9 ?3.4 12 ?10 Dig II - Wrist 13    L Ulnar - Orthodromic, (Dig V, Mid palm)     Dig V Wrist 2.9 ?3.1 6 ?5 Dig V - Wrist 87                     F  Wave    Nerve F Lat Ref.   ms ms  L Tibial - AH 52.5 ?56.0  L Ulnar - ADM 30.3 ?32.0  R Tibial - AH 53.8 ?56.0           EMG Summary Table    Spontaneous MUAP Recruitment  Muscle IA Fib PSW Fasc Other Amp Dur. Poly Pattern  R. Tibialis anterior Normal None None None _______ Normal Normal Normal Normal  R. Tibialis posterior Normal None None None _______ Normal Normal Normal Normal  R. Peroneus longus Normal None None None _______ Normal Normal Normal Normal  R. Gastrocnemius (Medial head) Normal None None None _______ Normal Normal Normal Normal  R. Abductor hallucis Normal None None None _______  Normal Normal Normal Normal  R. Vastus lateralis Normal None None None _______ Normal Normal Normal Normal  R. First dorsal interosseous Normal None None None _______ Normal Normal Normal Normal  R. Pronator teres Normal None None None _______ Normal Normal Normal Normal  R. Biceps brachii Normal None None None _______ Normal Normal Normal Normal  R. Deltoid Normal None None None _______ Normal Normal Normal Normal  R. Extensor digitorum communis Normal None None None _______ Normal Normal Normal Normal

## 2019-05-29 NOTE — Progress Notes (Signed)
PATIENT: [name] [name]. DOB: [birth year]  No chief complaint on file.    HISTORICAL  [name] [name].  is a 74 year old male, seen in request by his primary care physician [name], Ishmael Holter, and ENT Dr. Blenda Nicely, Estill Bamberg for intermittent numbness of left lower lip, bilateral fingertips, and toes.  Initial evaluation was on January 17, 2019.  I have reviewed and summarized the referring note from the referring physician.  He had a past medical history of hyperlipidemia, still enjoying swimming, at least 3 times a week, one mile each time,  Around August 2018, he noticed difficulty breathing while swimming, also began to notice intermittent numbness involving left lower lip, bilateral fingertips, bilateral toes, but he denies dysarthria, denies limb muscle weakness.  Seen by cardiologist, was diagnosed with coronary artery disease, eventually had stent, but he still has shortness of breath, even after walking to his mailbox, or unloading groceries.  He was evaluated by specialist, there was no significant abnormality found.  He was recently also evaluated by pulmonologist, per patient, pulmonary functional test was normal.  He had a history of prostate cancer, had surgery in the past, PET scan in November 2020 showed focal radiotracer activity in the central prostatectomy bed, suspicious for local recurrence.  There is no evidence of pelvic lymph node or other distant metastatic disease.  He denies double vision, no droopy eyelid, no dysarthria, no swallowing difficulty, he denies limb muscle weakness.  UPDATE Apr 18 2019: He is overall doing very well, no gait abnormality, reported normal stress test recently, he has pending dental work, is a candidate for right knee replacement, continue complains of intermittent bilateral fingertips and toes numbness, no pain, has urinary urgency, he contributed to his previous prostate cancer treatment, no difficulty control his bowel  movement  I reviewed laboratory evaluation November 2020, negative acetylcholine receptor antibody, CPK, ESR, C-reactive protein, ANA, RPR, copper, B12, vitamin D was 23, A1c was 6.5, normal TSH, CBC, elevated creatinine 1.6, GFR of 42, LDL 89, triglyceride 164,  UPDATE May 29 2019: Patient return for electrodiagnostic study today, which was essentially normal, there was no large fiber neuropathy, no right cervical, lumbar sacral radiculopathy.  Reviewed laboratory evaluation, A1c was 6.2, mildly elevated, BMP showed mild abnormal creatinine 1.56, with GFR of 43, normal liver functional tests, lipid panel showed LDL of 90, triglyceride of 172, normal CPK 90, normal C-reactive protein, ESR, ANA, RPR, copper, B12, acetylcholine receptor antibody, vitamin D level was mildly decreased 23  I personally reviewed MRI of cervical spine, multilevel mild degenerative changes, most severe at C5-6, C6 and 7, with mild spinal stenosis, and severe bilateral foraminal stenosis  Patient continue complains of intermittent bilateral fingertips, toes numbness tingling, seems to be temperature sensitive, most noticeable taking a shower, in cold temperature, improved after stay in warm environment  He has right knee replacement pending, recently was treated for root canal infection with antibiotics, which seems to help his symptoms some   REVIEW OF SYSTEMS: Full 14 system review of systems performed and notable only for as above All other review of systems were negative.  ALLERGIES: No Known Allergies  HOME MEDICATIONS: Current Outpatient Medications  Medication Sig Dispense Refill  . aspirin EC 81 MG tablet Take 81 mg by mouth every evening.     Marland Kitchen Besifloxacin HCl (BESIVANCE OP) Apply to eye.    . bimatoprost (LUMIGAN) 0.01 % SOLN Place 1 drop into both eyes at bedtime.    . clopidogrel (  PLAVIX) 75 MG tablet Take 1 tablet (75 mg total) by mouth daily with breakfast. 90 tablet 8  . Coenzyme Q10 (COQ-10)  100 MG CAPS Take 100 mg by mouth every evening.     . dorzolamide-timolol (COSOPT) 22.3-6.8 MG/ML ophthalmic solution Place 1 drop into both eyes 2 (two) times daily.     . famotidine (PEPCID) 20 MG tablet Take 20 mg by mouth 2 (two) times daily.    . fenofibrate 160 MG tablet Take 160 mg by mouth daily.    . isosorbide mononitrate (IMDUR) 30 MG 24 hr tablet Take 1 tablet (30 mg total) by mouth daily. 90 tablet 3  . magnesium oxide (MAG-OX) 400 MG tablet Take 400 mg by mouth every evening.     . Multiple Vitamins-Minerals (MULTIVITAMIN WITH MINERALS) tablet Take 1 tablet by mouth 2 (two) times a week. Sundays and Wednesdays    . nitroGLYCERIN (NITROSTAT) 0.4 MG SL tablet Place 0.4 mg under the tongue every 5 (five) minutes as needed for chest pain.    . pantoprazole (PROTONIX) 40 MG tablet Take 1 tablet (40 mg total) by mouth daily. 90 tablet 3  . rosuvastatin (CRESTOR) 20 MG tablet Take 20 mg by mouth every evening.     Marland Kitchen telmisartan (MICARDIS) 80 MG tablet Take 1 tablet (80 mg total) by mouth daily. 90 tablet 4   No current facility-administered medications for this visit.    PAST MEDICAL HISTORY: Past Medical History:  Diagnosis Date  . ABNORMAL EKG 10/07/2008  . ADVEF, DRUG/MED/BIOL SUBST, OTHER DRUG NOS 09/26/2006  . Agent orange exposure   . Allergy    seasonal  . Cataract    left eye-surgery 07-2015  . DEGENERATIVE JOINT DISEASE, MODERATE 04/13/2007  . DIVERTICULOSIS, COLON 04/13/2007  . ELEVATED PROSTATE SPECIFIC ANTIGEN 04/14/2007  . Glaucoma    sees [name]   . HYPERLIPIDEMIA 09/26/2006  . HYPERTENSION 04/13/2007  . JOINT EFFUSION, KNEE 06/15/2009  . Melanoma [facility])    sees [name]   . Numbness and tingling   . PROSTATE CANCER 10/05/2009   sees [name]  . Renal insufficiency    RESOLVED PER PATIENT    PAST SURGICAL HISTORY: Past Surgical History:  Procedure Laterality Date  . ACROMIOPLASTY Right 05-06-14   per [name]   . BUNIONECTOMY     bilateral  . CATARACT  EXTRACTION Left    per [name]   . COLONOSCOPY  07/10/2015   per [name], benign polyp, repeat in 10 yrs   . CORONARY STENT INTERVENTION N/A 01/17/2018   Procedure: CORONARY STENT INTERVENTION;  Surgeon: [name], MD;  Location: [place] [facility];  Service: Cardiovascular;  Laterality: N/A;  . CYSTECTOMY     benign from hand  . GLAUCOMA SURGERY Bilateral 2014   . heart stent    . INSERTION OF MESH N/A 01/10/2013   Procedure: INSERTION OF MESH;  Surgeon: Earnstine Regal, MD;  Location: [place];  Service: General;  Laterality: N/A;  . KNEE ARTHROSCOPY Right Jan. 2013   right knee, per [name]   . KNEE ARTHROSCOPY Left 01-14-15   per [name]   . LEFT HEART CATH AND CORONARY ANGIOGRAPHY N/A 01/17/2018   Procedure: LEFT HEART CATH AND CORONARY ANGIOGRAPHY;  Surgeon: [name], MD;  Location: [place] [facility];  Service: Cardiovascular;  Laterality: N/A;  . LEFT HEART CATH AND CORONARY ANGIOGRAPHY N/A 04/06/2018   Procedure: LEFT HEART CATH AND CORONARY ANGIOGRAPHY;  Surgeon: [name], MD;  Location: [place] [facility];  Service: Cardiovascular;  Laterality: N/A;  . MELANOMA EXCISION  2012   per [name]  . PROSTATECTOMY     per [name]  . TONSILLECTOMY    . UMBILICAL HERNIA REPAIR  11/01/2011   Procedure: HERNIA REPAIR UMBILICAL ADULT;  Surgeon: Earnstine Regal, MD;  Location: [place];  Service: General;  Laterality: N/A;  Repair umbilical hernia with mesh patch  . VASECTOMY    . VENTRAL HERNIA REPAIR  01/10/2013   with mesh     [name]  . VENTRAL HERNIA REPAIR N/A 01/10/2013   Procedure: HERNIA REPAIR VENTRAL ADULT ;  Surgeon: Earnstine Regal, MD;  Location: [place];  Service: General;  Laterality: N/A;    FAMILY HISTORY: Family History  Problem Relation Age of Onset  . Stroke Mother   . Diabetes Mother   . Heart failure Father   . Prostate cancer Father   . Colon cancer Neg Hx   . Colon polyps Neg Hx   . Esophageal cancer Neg Hx    . Rectal cancer Neg Hx   . Stomach cancer Neg Hx     SOCIAL HISTORY: Social History   Socioeconomic History  . Marital status: Married    Spouse name: Charlett Nose  . Number of children: 2  . Years of education: 30  . Highest education level: High school graduate  Occupational History  . Occupation: Administrator, sports    Comment: retired  Tobacco Use  . Smoking status: Former Smoker    Packs/day: 1.00    Years: 9.00    Pack years: 9.00    Start date: 03/08/1963    Quit date: 03/07/1972    Years since quitting: 47.2  . Smokeless tobacco: Never Used  . Tobacco comment: States AAA completed   Substance and Sexual Activity  . Alcohol use: Yes    Comment: occasional  . Drug use: Never  . Sexual activity: Not on file  Other Topics Concern  . Not on file  Social History Narrative   Was in the [employer]; Office manager   Currently can go to the [employer] exposure   Right-handed.   2-3 cups caffeine daily,      Wife; married 40 years   2 children; grands no       05/11/2018:    Lives with wife on one level home   Enjoys going swimming at gym 3 days/week; currently enjoying cardiac rehab   Has one son, one daughter, and one granddaughter who all live in [place]. Travels to see them every other week.      Social Determinants of Health   Financial Resource Strain:   . Difficulty of Paying Living Expenses:   Food Insecurity:   . Worried About Charity fundraiser in the Last Year:   . Arboriculturist in the Last Year:   Transportation Needs:   . Film/video editor (Medical):   Marland Kitchen Lack of Transportation (Non-Medical):   Physical Activity:   . Days of Exercise per Week:   . Minutes of Exercise per Session:   Stress:   . Feeling of Stress :   Social Connections:   . Frequency of Communication with Friends and Family:   . Frequency of Social Gatherings with Friends and Family:   . Attends Religious Services:   . Active Member of Clubs or Organizations:   .  Attends Archivist Meetings:   Marland Kitchen Marital Status:   Intimate Partner Violence:   . Fear of Current or Ex-Partner:   . Emotionally Abused:   Marland Kitchen Physically Abused:   . Sexually Abused:      PHYSICAL EXAM   There were no vitals filed for this visit.  Not recorded      There is no height or weight on file to calculate BMI.  PHYSICAL EXAMNIATION:  Gen: NAD, conversant, well nourised, well groomed                      NEUROLOGICAL EXAM:  MENTAL STATUS: Speech:    Speech is normal; fluent and spontaneous with normal comprehension.  Cognition:     Orientation to time, place and person     Normal recent and remote memory     Normal Attention span and concentration     Normal Language, naming, repeating,spontaneous speech     Fund of knowledge   CRANIAL NERVES: CN II: Visual fields are full to confrontation.  Pupils are round equal and briskly reactive to light. CN III, IV, VI: extraocular movement are normal. No ptosis. CN V: Facial sensation is intact  CN VII: Face is symmetric with normal eye closure and smile. CN VIII: Hearing is normal to causal conversation. CN IX, X: Palate elevates symmetrically. Phonation is normal. CN XI: Head turning and shoulder shrug are intact   MOTOR: Moving 4 extremities without difficulties  SENSORY: Mildly length dependent decreased light touch vibratory sensation to ankle level,.  COORDINATION: No truncal or limb dysmetria noticed  GAIT/STANCE: Posture is normal.    DIAGNOSTIC DATA (LABS, IMAGING, TESTING) - I reviewed patient records, labs, notes, testing and imaging myself where available.   ASSESSMENT AND PLAN  [name] [name]. is a 74 y.o. male   Intermittent bilateral fingertips and toes paresthesia  EMG nerve conduction study showed no evidence of large fiber peripheral neuropathy, on right cervical, lumbar sacral radiculopathy  MRI of cervical spine showed multilevel spondylosis changes, most severe at C5-6,  C6 and 7, there is no canal stenosis, with severe bilateral foraminal stenosis, but he has no weakness, no significant radiating pain from his neck to his upper extremity, no persistent sensory changes.  No evidence of cervical radiculopathy on EMG nerve conduction study,  Patient reported that his fingertips and toes paresthesia are very temperature sensitive, most noticeable taking shower, in cold temperature, improved after stay in warmer temperature, suggestive of vascular phenomenon, such as Raynaud's phenomenon, if appropriate, may consider calcium blocker for his blood pressure control,  He will only return to clinic for new issues      Marcial Pacas, M.D. Ph.D.  [facility] [address] [place], [place] [address] Ph: [phone] Fax: [phone]  CC: [name], MD, Helayne Seminole, MD

## 2019-06-18 DIAGNOSIS — Z961 Presence of intraocular lens: Secondary | ICD-10-CM | POA: Diagnosis not present

## 2019-06-18 DIAGNOSIS — H26492 Other secondary cataract, left eye: Secondary | ICD-10-CM | POA: Diagnosis not present

## 2019-06-20 DIAGNOSIS — X32XXXD Exposure to sunlight, subsequent encounter: Secondary | ICD-10-CM | POA: Diagnosis not present

## 2019-06-20 DIAGNOSIS — Z08 Encounter for follow-up examination after completed treatment for malignant neoplasm: Secondary | ICD-10-CM | POA: Diagnosis not present

## 2019-06-20 DIAGNOSIS — D225 Melanocytic nevi of trunk: Secondary | ICD-10-CM | POA: Diagnosis not present

## 2019-06-20 DIAGNOSIS — Z1283 Encounter for screening for malignant neoplasm of skin: Secondary | ICD-10-CM | POA: Diagnosis not present

## 2019-06-20 DIAGNOSIS — L570 Actinic keratosis: Secondary | ICD-10-CM | POA: Diagnosis not present

## 2019-06-20 DIAGNOSIS — Z85820 Personal history of malignant melanoma of skin: Secondary | ICD-10-CM | POA: Diagnosis not present

## 2019-06-22 IMAGING — CR CHEST - 2 VIEW
2 series · 2 of 2 positions shown · non-contrast
Comparison: 11/06/2014

CLINICAL DATA: Dyspnea

EXAM:
CHEST - 2 VIEW

[w chest pa]
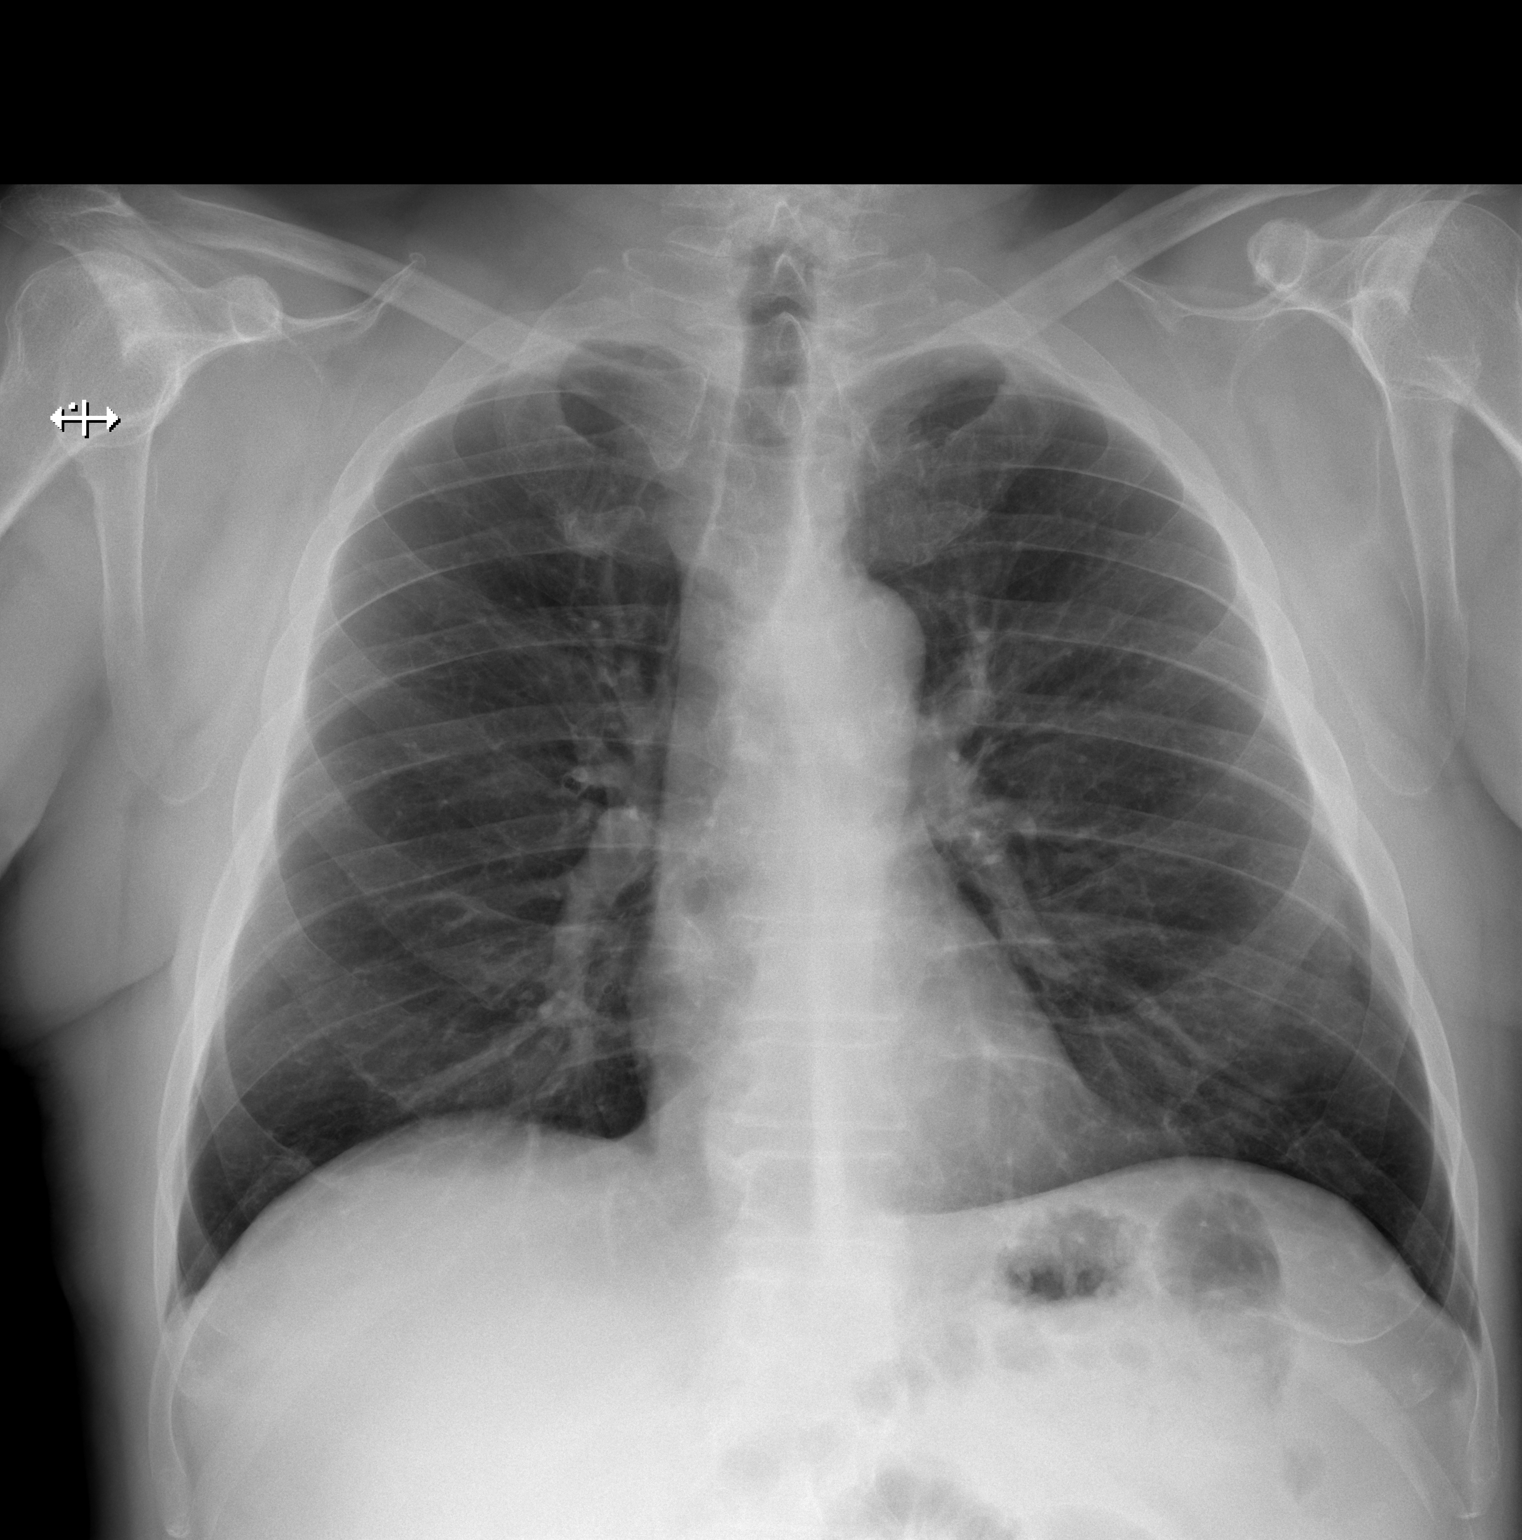

[w chest lat]
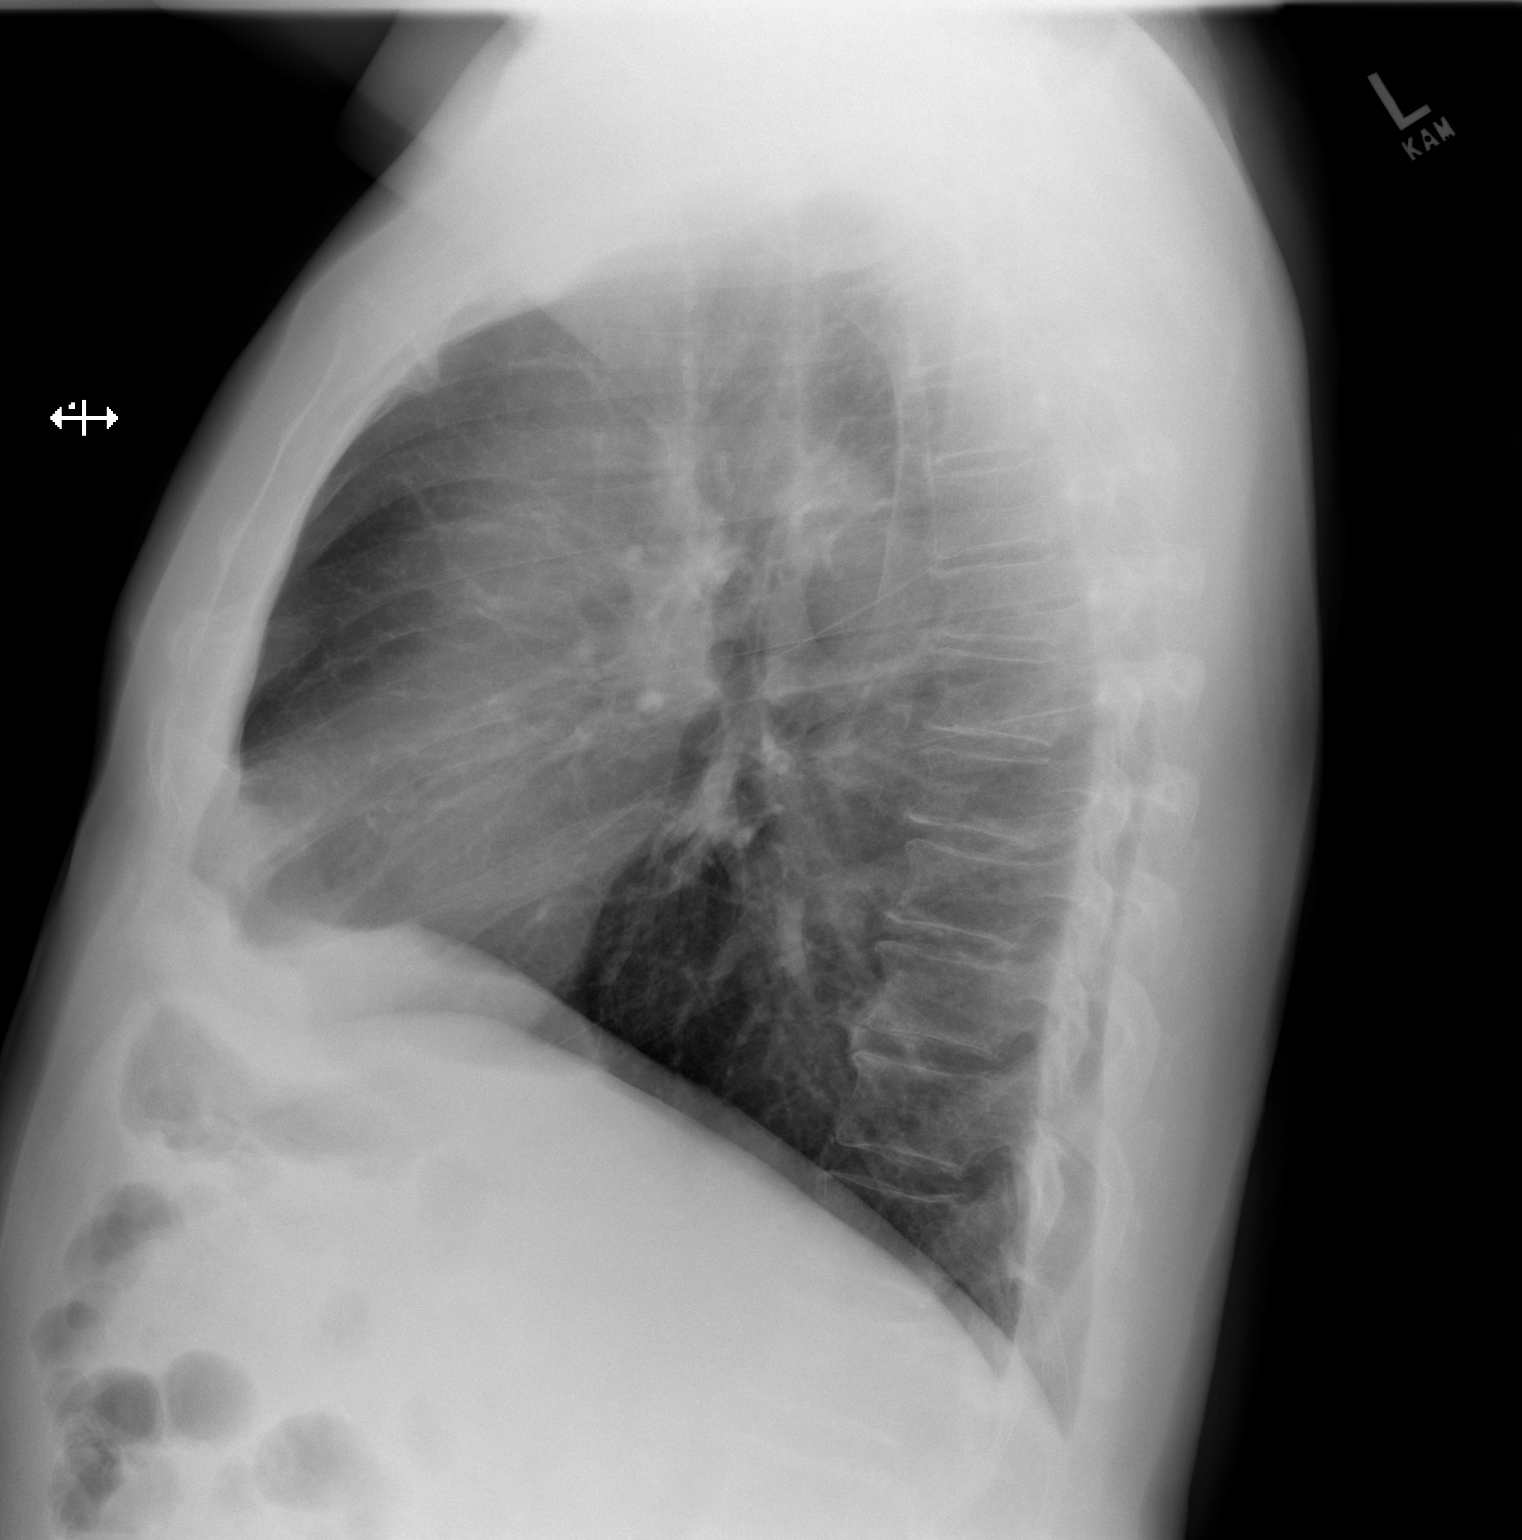

[2 of 2 positions shown; findings below may reference images not displayed]

FINDINGS: Normal heart size. Lungs clear. No pneumothorax. No pleural
effusion.
IMPRESSION: No active cardiopulmonary disease.

## 2019-06-24 ENCOUNTER — Other Ambulatory Visit (HOSPITAL_COMMUNITY): Payer: Self-pay | Admitting: Nephrology

## 2019-06-24 ENCOUNTER — Other Ambulatory Visit: Payer: Self-pay | Admitting: Nephrology

## 2019-06-24 DIAGNOSIS — N1832 Chronic kidney disease, stage 3b: Secondary | ICD-10-CM | POA: Diagnosis not present

## 2019-06-24 DIAGNOSIS — I2510 Atherosclerotic heart disease of native coronary artery without angina pectoris: Secondary | ICD-10-CM | POA: Diagnosis not present

## 2019-06-24 DIAGNOSIS — I129 Hypertensive chronic kidney disease with stage 1 through stage 4 chronic kidney disease, or unspecified chronic kidney disease: Secondary | ICD-10-CM | POA: Diagnosis not present

## 2019-06-24 DIAGNOSIS — C61 Malignant neoplasm of prostate: Secondary | ICD-10-CM | POA: Diagnosis not present

## 2019-06-24 DIAGNOSIS — E785 Hyperlipidemia, unspecified: Secondary | ICD-10-CM | POA: Diagnosis not present

## 2019-06-24 DIAGNOSIS — M1710 Unilateral primary osteoarthritis, unspecified knee: Secondary | ICD-10-CM | POA: Diagnosis not present

## 2019-07-01 DIAGNOSIS — C61 Malignant neoplasm of prostate: Secondary | ICD-10-CM | POA: Diagnosis not present

## 2019-07-01 DIAGNOSIS — B356 Tinea cruris: Secondary | ICD-10-CM | POA: Diagnosis not present

## 2019-07-01 DIAGNOSIS — N393 Stress incontinence (female) (male): Secondary | ICD-10-CM | POA: Diagnosis not present

## 2019-07-01 DIAGNOSIS — N5201 Erectile dysfunction due to arterial insufficiency: Secondary | ICD-10-CM | POA: Diagnosis not present

## 2019-07-02 ENCOUNTER — Other Ambulatory Visit: Payer: Self-pay

## 2019-07-02 ENCOUNTER — Ambulatory Visit (HOSPITAL_COMMUNITY)
Admission: RE | Admit: 2019-07-02 | Discharge: 2019-07-02 | Disposition: A | Discharge status: Home / Self Care | Payer: Medicare Other | Source: Ambulatory Visit | Attending: Nephrology | Admitting: Nephrology

## 2019-07-02 DIAGNOSIS — N189 Chronic kidney disease, unspecified: Secondary | ICD-10-CM | POA: Diagnosis not present

## 2019-07-02 DIAGNOSIS — N1832 Chronic kidney disease, stage 3b: Secondary | ICD-10-CM | POA: Diagnosis not present

## 2019-07-02 DIAGNOSIS — N281 Cyst of kidney, acquired: Secondary | ICD-10-CM | POA: Diagnosis not present

## 2019-07-11 ENCOUNTER — Encounter: Payer: Self-pay | Admitting: Family Medicine

## 2019-07-12 ENCOUNTER — Telehealth: Payer: Self-pay

## 2019-07-12 NOTE — Telephone Encounter (Signed)
   Primary Cardiologist: Jenkins Rouge, MD  Chart reviewed as part of pre-operative protocol coverage. Patient was contacted 07/12/2019 in reference to pre-operative risk assessment for pending surgery as outlined below.  [name] [name]. was last seen on 05/01/19 by [name].  Since that day, [name] [name]. has done well. He can complete more than 4.0 METS without angina. Per Dr. Kyla Balzarine prior documentation, he may proceed with knee surgery without further testing. He may hold plavix 5-7 days prior to surgery. We prefer to continue ASA throughout the perioperative period, unless doing so would significantly increase morbidity or mortality.   Therefore, based on ACC/AHA guidelines, the patient would be at acceptable risk for the planned procedure without further cardiovascular testing.   I will route this recommendation to the requesting party via Epic fax function and remove from pre-op pool.  Please call with questions.  Ledora Bottcher, PA 07/12/2019, 5:49 PM

## 2019-07-12 NOTE — Telephone Encounter (Signed)
   [facility] Pre-operative Risk Assessment    Request for surgical clearance:  1. What type of surgery is being performed? RIGHT TOTAL KNEE ARTHROPLASTY    2. When is this surgery scheduled? TBD   3. What type of clearance is required (medical clearance vs. Pharmacy clearance to hold med vs. Both)? BOTH  4. Are there any medications that need to be held prior to surgery and how long? ASA AND PLAVIX   5. Practice name and name of physician performing surgery? [facility]; DR. Remo Lipps [facility]   6. What is your office phone number [phone] ATTN: ASHLEY HILTON   7.   What is your office fax number [phone]   8.   Anesthesia type (None, local, MAC, general) ? SPINAL   [name] [name] 07/12/2019, 3:51 PM  _________________________________________________________________   (provider comments below)

## 2019-07-19 ENCOUNTER — Encounter (HOSPITAL_COMMUNITY): Payer: Self-pay | Admitting: Orthopedic Surgery

## 2019-07-19 ENCOUNTER — Encounter (HOSPITAL_COMMUNITY)
Admission: RE | Admit: 2019-07-19 | Discharge: 2019-07-19 | Disposition: A | Discharge status: Home / Self Care | Payer: Medicare Other | Source: Ambulatory Visit | Attending: Orthopedic Surgery | Admitting: Orthopedic Surgery

## 2019-07-19 ENCOUNTER — Other Ambulatory Visit (HOSPITAL_COMMUNITY)
Admission: RE | Admit: 2019-07-19 | Discharge: 2019-07-19 | Disposition: A | Discharge status: Home / Self Care | Payer: Medicare Other | Source: Ambulatory Visit | Attending: Orthopedic Surgery | Admitting: Orthopedic Surgery

## 2019-07-19 ENCOUNTER — Other Ambulatory Visit: Payer: Self-pay

## 2019-07-19 DIAGNOSIS — K219 Gastro-esophageal reflux disease without esophagitis: Secondary | ICD-10-CM | POA: Diagnosis not present

## 2019-07-19 DIAGNOSIS — I129 Hypertensive chronic kidney disease with stage 1 through stage 4 chronic kidney disease, or unspecified chronic kidney disease: Secondary | ICD-10-CM | POA: Diagnosis not present

## 2019-07-19 DIAGNOSIS — Z01818 Encounter for other preprocedural examination: Secondary | ICD-10-CM | POA: Insufficient documentation

## 2019-07-19 DIAGNOSIS — Z955 Presence of coronary angioplasty implant and graft: Secondary | ICD-10-CM | POA: Insufficient documentation

## 2019-07-19 DIAGNOSIS — M1711 Unilateral primary osteoarthritis, right knee: Secondary | ICD-10-CM | POA: Diagnosis not present

## 2019-07-19 DIAGNOSIS — N183 Chronic kidney disease, stage 3 unspecified: Secondary | ICD-10-CM | POA: Diagnosis not present

## 2019-07-19 DIAGNOSIS — E785 Hyperlipidemia, unspecified: Secondary | ICD-10-CM | POA: Insufficient documentation

## 2019-07-19 DIAGNOSIS — I2510 Atherosclerotic heart disease of native coronary artery without angina pectoris: Secondary | ICD-10-CM | POA: Diagnosis not present

## 2019-07-19 DIAGNOSIS — H409 Unspecified glaucoma: Secondary | ICD-10-CM | POA: Diagnosis not present

## 2019-07-19 DIAGNOSIS — I10 Essential (primary) hypertension: Secondary | ICD-10-CM | POA: Insufficient documentation

## 2019-07-19 DIAGNOSIS — Z7902 Long term (current) use of antithrombotics/antiplatelets: Secondary | ICD-10-CM | POA: Insufficient documentation

## 2019-07-19 DIAGNOSIS — Z87891 Personal history of nicotine dependence: Secondary | ICD-10-CM | POA: Diagnosis not present

## 2019-07-19 DIAGNOSIS — Z87442 Personal history of urinary calculi: Secondary | ICD-10-CM | POA: Diagnosis not present

## 2019-07-19 DIAGNOSIS — Z20822 Contact with and (suspected) exposure to covid-19: Secondary | ICD-10-CM | POA: Insufficient documentation

## 2019-07-19 DIAGNOSIS — Z7982 Long term (current) use of aspirin: Secondary | ICD-10-CM | POA: Insufficient documentation

## 2019-07-19 DIAGNOSIS — Z79899 Other long term (current) drug therapy: Secondary | ICD-10-CM | POA: Diagnosis not present

## 2019-07-19 DIAGNOSIS — Z8546 Personal history of malignant neoplasm of prostate: Secondary | ICD-10-CM | POA: Insufficient documentation

## 2019-07-19 LAB — CBC
HCT: 45.3 % (ref 39.0–52.0)
Hemoglobin: 14.5 g/dL (ref 13.0–17.0)
MCH: 29.4 pg (ref 26.0–34.0)
MCHC: 32 g/dL (ref 30.0–36.0)
MCV: 91.9 fL (ref 80.0–100.0)
Platelets: 230 K/uL (ref 150–400)
RBC: 4.93 MIL/uL (ref 4.22–5.81)
RDW: 13.2 % (ref 11.5–15.5)
WBC: 6.8 K/uL (ref 4.0–10.5)
nRBC: 0 % (ref 0.0–0.2)

## 2019-07-19 LAB — BASIC METABOLIC PANEL
Anion gap: 9 (ref 5–15)
BUN: 26 mg/dL — ABNORMAL HIGH (ref 8–23)
CO2: 23 mmol/L (ref 22–32)
Calcium: 9.2 mg/dL (ref 8.9–10.3)
Chloride: 107 mmol/L (ref 98–111)
Creatinine, Ser: 1.71 mg/dL — ABNORMAL HIGH (ref 0.61–1.24)
GFR calc Af Amer: 46 mL/min — ABNORMAL LOW (ref >60)
GFR calc non Af Amer: 39 mL/min — ABNORMAL LOW (ref >60)
Glucose, Bld: 103 mg/dL — ABNORMAL HIGH (ref 70–99)
Potassium: 4.2 mmol/L (ref 3.5–5.1)
Sodium: 139 mmol/L (ref 135–145)

## 2019-07-19 LAB — SARS CORONAVIRUS 2 (TAT 6-24 HRS): SARS Coronavirus 2: NEGATIVE

## 2019-07-19 NOTE — Progress Notes (Signed)
DUE TO COVID-19 ONLY ONE VISITOR IS ALLOWED TO COME WITH YOU AND STAY IN THE WAITING ROOM ONLY DURING PRE OP AND PROCEDURE DAY OF SURGERY. THE 1 VISITOR MAY VISIT WITH YOU AFTER SURGERY IN YOUR PRIVATE ROOM DURING VISITING HOURS ONLY!  YOU NEED TO HAVE A COVID 19 TEST ON__5/14/21 _____ @__1050_____ , THIS TEST MUST BE DONE BEFORE SURGERY, COME  [address], [address] [place] , [id number].  ([place]) ONCE YOUR COVID TEST IS COMPLETED, PLEASE BEGIN THE QUARANTINE INSTRUCTIONS AS OUTLINED IN YOUR HANDOUT.                [name] [name].  07/19/2019   Your procedure is scheduled on:  07/22/19   Report to [facility] Main  Entrance   Report to admitting at   1210 pm      Call this number if you have problems the morning of surgery [phone]    Remember: Do not eat food   :After Midnight. May have clear liquids from 29midnite until 1140am morning of surgery then nothing by mouth  BRUSH YOUR TEETH MORNING OF SURGERY AND RINSE YOUR MOUTH OUT, NO CHEWING GUM CANDY OR MINTS.     Take these medicines the morning of surgery with A SIP OF WATER:  Protonix, Imdur, eye drops as usual                                  You may not have any metal on your body including hair pins and              piercings  Do not wear jewelry, , lotions, powders or perfumes, deodorant fingernails.                  Men may shave face and neck.   Do not bring valuables to the hospital. [place].  Contacts, dentures or bridgework may not be worn into surgery.  Leave suitcase in the car. After surgery it may be brought to your room.     Patients discharged the day of surgery will not be allowed to drive home. IF YOU ARE HAVING SURGERY AND GOING HOME THE SAME DAY, YOU MUST HAVE AN ADULT TO DRIVE YOU HOME AND BE WITH YOU FOR 24 HOURS. YOU MAY GO HOME BY TAXI OR UBER OR ORTHERWISE, BUT AN ADULT MUST ACCOMPANY YOU HOME AND STAY WITH YOU FOR 24  HOURS.                  Please read over the following fact sheets you were given: _____________________________________________________________________                CLEAR LIQUID DIET   Foods Allowed                                                                     Foods Excluded  Coffee and tea, regular and decaf  liquids that you cannot  NO milk or creamer in coffee or tea  Plain Jell-O any favor except red or purple                                           see through such as: Fruit ices (not with fruit pulp)                                     milk, soups, orange juice  Iced Popsicles                                    All solid food Carbonated beverages, regular and diet                                    Cranberry, grape and apple juices Sports drinks like Gatorade Lightly seasoned clear broth or consume(fat free) Sugar, honey syrup  Sample Menu Breakfast                                Lunch                                     Supper Cranberry juice                    Beef broth                            Chicken broth Jell-O                                     Grape juice                           Apple juice Coffee or tea                        Jell-O                                      Popsicle                                                Coffee or tea                        Coffee or tea  _____________________________________________________________________  [facility] - Preparing for Surgery Before surgery, you can play an important role.  Because skin is not sterile, your skin needs to be as free of germs as possible.  You can reduce the number of germs on your skin by washing with CHG (chlorahexidine gluconate) soap before  surgery.  CHG is an antiseptic cleaner which kills germs and bonds with the skin to continue killing germs even after washing. Please DO NOT use if you have an allergy to CHG or antibacterial soaps.  If your  skin becomes reddened/irritated stop using the CHG and inform your nurse when you arrive at Short Stay. Do not shave (including legs and underarms) for at least 48 hours prior to the first CHG shower.  You may shave your face/neck. Please follow these instructions carefully:  1.  Shower with CHG Soap the night before surgery and the  morning of Surgery.  2.  If you choose to wash your hair, wash your hair first as usual with your  normal  shampoo.  3.  After you shampoo, rinse your hair and body thoroughly to remove the  shampoo.                           4.  Use CHG as you would any other liquid soap.  You can apply chg directly  to the skin and wash                       Gently with a scrungie or clean washcloth.  5.  Apply the CHG Soap to your body ONLY FROM THE NECK DOWN.   Do not use on face/ open                           Wound or open sores. Avoid contact with eyes, ears mouth and genitals (private parts).                       Wash face,  Genitals (private parts) with your normal soap.             6.  Wash thoroughly, paying special attention to the area where your surgery  will be performed.  7.  Thoroughly rinse your body with warm water from the neck down.  8.  DO NOT shower/wash with your normal soap after using and rinsing off  the CHG Soap.                9.  Pat yourself dry with a clean towel.            10.  Wear clean pajamas.            11.  Place clean sheets on your bed the night of your first shower and do not  sleep with pets. Day of Surgery : Do not apply any lotions/deodorants the morning of surgery.  Please wear clean clothes to the hospital/surgery center.  FAILURE TO FOLLOW THESE INSTRUCTIONS MAY RESULT IN THE CANCELLATION OF YOUR SURGERY PATIENT SIGNATURE_________________________________  NURSE SIGNATURE__________________________________  ________________________________________________________________________   [name] [name]  An incentive spirometer  is a tool that can help keep your lungs clear and active. This tool measures how well you are filling your lungs with each breath. Taking long deep breaths may help reverse or decrease the chance of developing breathing (pulmonary) problems (especially infection) following:  A long period of time when you are unable to move or be active. BEFORE THE PROCEDURE   If the spirometer includes an indicator to show your best effort, your nurse or respiratory therapist will set it to a desired goal.  If possible, sit up straight or lean slightly forward. Try not  to slouch.  Hold the incentive spirometer in an upright position. INSTRUCTIONS FOR USE  1. Sit on the edge of your bed if possible, or sit up as far as you can in bed or on a chair. 2. Hold the incentive spirometer in an upright position. 3. Breathe out normally. 4. Place the mouthpiece in your mouth and seal your lips tightly around it. 5. Breathe in slowly and as deeply as possible, raising the piston or the ball toward the top of the column. 6. Hold your breath for 3-5 seconds or for as long as possible. Allow the piston or ball to fall to the bottom of the column. 7. Remove the mouthpiece from your mouth and breathe out normally. 8. Rest for a few seconds and repeat Steps 1 through 7 at least 10 times every 1-2 hours when you are awake. Take your time and take a few normal breaths between deep breaths. 9. The spirometer may include an indicator to show your best effort. Use the indicator as a goal to work toward during each repetition. 10. After each set of 10 deep breaths, practice coughing to be sure your lungs are clear. If you have an incision (the cut made at the time of surgery), support your incision when coughing by placing a pillow or rolled up towels firmly against it. Once you are able to get out of bed, walk around indoors and cough well. You may stop using the incentive spirometer when instructed by your caregiver.  RISKS AND  COMPLICATIONS  Take your time so you do not get dizzy or light-headed.  If you are in pain, you may need to take or ask for pain medication before doing incentive spirometry. It is harder to take a deep breath if you are having pain. AFTER USE  Rest and breathe slowly and easily.  It can be helpful to keep track of a log of your progress. Your caregiver can provide you with a simple table to help with this. If you are using the spirometer at home, follow these instructions: [place]ant[place] IF:   You are having difficultly using the spirometer.  You have trouble using the spirometer as often as instructed.  Your pain medication is not giving enough relief while using the spirometer.  You develop fever of 100.5 F (38.1 C) or higher. SEEK IMMEDIATE MEDICAL CARE IF:   You cough up bloody sputum that had not been present before.  You develop fever of 102 F (38.9 C) or greater.  You develop worsening pain at or near the incision site. MAKE SURE YOU:   Understand these instructions.  Will watch your condition.  Will get help right away if you are not doing well or get worse. Document Released: 07/04/2006 Document Revised: 05/16/2011 Document Reviewed: 09/04/2006 ExitCare Patient Information 2014 Exit[employer], [place].   ________________________________________________________________________  WHAT IS A BLOOD TRANSFUSION? Blood Transfusion Information  A transfusion is the replacement of blood or some of its parts. Blood is made up of multiple cells which provide different functions.  Red blood cells carry oxygen and are used for blood loss replacement.  White blood cells fight against infection.  Platelets control bleeding.  Plasma helps clot blood.  Other blood products are available for specialized needs, such as hemophilia or other clotting disorders. BEFORE THE TRANSFUSION  Who gives blood for transfusions?   Healthy volunteers who are fully evaluated to make sure  their blood is safe. This is blood bank blood. Transfusion therapy is the safest  it has ever been in the practice of medicine. Before blood is taken from a donor, a complete history is taken to make sure that person has no history of diseases nor engages in risky social behavior (examples are intravenous drug use or sexual activity with multiple partners). The donor's travel history is screened to minimize risk of transmitting infections, such as malaria. The donated blood is tested for signs of infectious diseases, such as HIV and hepatitis. The blood is then tested to be sure it is compatible with you in order to minimize the chance of a transfusion reaction. If you or a relative donates blood, this is often done in anticipation of surgery and is not appropriate for emergency situations. It takes many days to process the donated blood. RISKS AND COMPLICATIONS Although transfusion therapy is very safe and saves many lives, the main dangers of transfusion include:   Getting an infectious disease.  Developing a transfusion reaction. This is an allergic reaction to something in the blood you were given. Every precaution is taken to prevent this. The decision to have a blood transfusion has been considered carefully by your caregiver before blood is given. Blood is not given unless the benefits outweigh the risks. AFTER THE TRANSFUSION  Right after receiving a blood transfusion, you will usually feel much better and more energetic. This is especially true if your red blood cells have gotten low (anemic). The transfusion raises the level of the red blood cells which carry oxygen, and this usually causes an energy increase.  The nurse administering the transfusion will monitor you carefully for complications. HOME CARE INSTRUCTIONS  No special instructions are needed after a transfusion. You may find your energy is better. Speak with your caregiver about any limitations on activity for underlying diseases  you may have. SEEK MEDICAL CARE IF:   Your condition is not improving after your transfusion.  You develop redness or irritation at the intravenous (IV) site. SEEK IMMEDIATE MEDICAL CARE IF:  Any of the following symptoms occur over the next 12 hours:  Shaking chills.  You have a temperature by mouth above 102 F (38.9 C), not controlled by medicine.  Chest, back, or muscle pain.  People around you feel you are not acting correctly or are confused.  Shortness of breath or difficulty breathing.  Dizziness and fainting.  You get a rash or develop hives.  You have a decrease in urine output.  Your urine turns a dark color or changes to pink, red, or brown. Any of the following symptoms occur over the next 10 days:  You have a temperature by mouth above 102 F (38.9 C), not controlled by medicine.  Shortness of breath.  Weakness after normal activity.  The white part of the eye turns yellow (jaundice).  You have a decrease in the amount of urine or are urinating less often.  Your urine turns a dark color or changes to pink, red, or brown. Document Released: 02/19/2000 Document Revised: 05/16/2011 Document Reviewed: 10/08/2007 [facility] Patient Information 2014 [place], [place].  _______________________________________________________________________

## 2019-07-19 NOTE — Progress Notes (Signed)
Anesthesia Chart Review   Case: [id number] Date/Time: 07/22/19 1425   Procedure: TOTAL KNEE ARTHROPLASTY (Right Knee)   Anesthesia type: Spinal   Pre-op diagnosis: Right knee endstage osteoarthritis   Location: [facility] ROOM 02 / [facility]   Surgeons: Netta Cedars, MD      DISCUSSION:74 y.o. former smoker (9 pack years, quit 03/07/72) with h/o GERD, HLD, HTN, CKD Stage III, CAD (stent to LCX 01/2018), DM II, right knee OA scheduled for above procedure 07/22/2019 with Dr. Netta Cedars.   01/17/18 stent to the prox to mid Cx Single vessel disease with good result. persistent symptoms F/U myovue done 03/01/18 low risk with no ischemia EF 60% continue medical Rx Repeat Cath 04/06/18 with widely patent stent with medical Rx recommended for other.   Per cardiology pre-operative risk assessment 07/12/2019, "Chart reviewed as part of pre-operative protocol coverage. Patient was contacted 07/12/2019 in reference to pre-operative risk assessment for pending surgery as outlined below.  Kinney Sackmann. was last seen on 05/01/19 by [name].  Since that day, Mando Blatz. has done well. He can complete more than 4.0 METS without angina. Per Dr. Kyla Balzarine prior documentation, he may proceed with knee surgery without further testing. He may hold plavix 5-7 days prior to surgery. We prefer to continue ASA throughout the perioperative period, unless doing so would significantly increase morbidity or mortality.   Therefore, based on ACC/AHA guidelines, the patient would be at acceptable risk for the planned procedure without further cardiovascular testing."  Per PAT nurse pt reports last dose of Plavix 07/16/2019.   Last A1C 6.2 05/20/2019.  Anticipate pt can proceed with planned procedure barring acute status change.   VS: There were no vitals taken for this visit.  PROVIDERS: [name], MD is PCP   Jenkins Rouge, MD is Cardiologist last seen 05/01/2019 LABS: Labs reviewed: Acceptable for surgery. (all labs  ordered are listed, but only abnormal results are displayed)  Labs Reviewed - No data to display   IMAGES:   EKG: 05/01/2019 Rate 50 bpm  CV: Cardiopulmonary Exercise Test 01/07/2019 Conclusion: Exercise testing with gas exchange demonstrates normal functional capacity when compared to matched sedentary norms. There is no clear cardiopulmonary limitation. Patient appears to have met physiological ventilatory limits at peak exercise with relation most like to body habitus. Patient would likely benefit from routine moderate intensity exercise. Note, there was a hypertensive response at peak exercise.   Echo 06/22/2018 IMPRESSIONS    1. The left ventricle has normal systolic function with an ejection  fraction of 60-65%. The cavity size was normal. Left ventricular diastolic  Doppler parameters are indeterminate.  2. The right ventricle has normal systolic function. The cavity was  normal. There is no increase in right ventricular wall thickness.  3. The aortic valve is tricuspid. Mild thickening of the aortic valve.  Aortic valve regurgitation is mild by color flow Doppler.  Cardiac Cath 04/06/2018  Widely patent mid circumflex drug-eluting stent.  The circumflex beyond the stent is unchanged from before with a 50 to 70% distal bifurcation stenosis in a [place] 110 configuration.  The circumflex is otherwise patent.  Left main is normal.  LAD is widely patent.  A branch of the first diagonal contains ostial 80% narrowing.  This tertiary branches are small.  RCA is widely patent with distal PDA and LV branch diffuse disease.  Normal LV function and LVEDP 10 mmHg.  RECOMMENDATIONS:   It is conceivable that angina is due to  the very distal bifurcation disease in the circumflex.  It does not appear to be angiographically severe.  Stent implantation would include crossing over an equally large side branch.  Considering the stability, complexity of an interventional procedure  (with risk of side branch occlusion), and the far distal location, recommend continued conservative medical management possibly adding either Ranexa or beta-blocker therapy to the regimen. Past Medical History:  Diagnosis Date  . ABNORMAL EKG 10/07/2008  . ADVEF, DRUG/MED/BIOL SUBST, OTHER DRUG NOS 09/26/2006  . Agent orange exposure   . Allergy    seasonal  . Cataract    left eye-surgery 07-2015  . DEGENERATIVE JOINT DISEASE, MODERATE 04/13/2007  . DIVERTICULOSIS, COLON 04/13/2007  . ELEVATED PROSTATE SPECIFIC ANTIGEN 04/14/2007  . GERD (gastroesophageal reflux disease)   . Glaucoma    sees [name]   . History of kidney stones    1970   . HYPERLIPIDEMIA 09/26/2006  . HYPERTENSION 04/13/2007  . JOINT EFFUSION, KNEE 06/15/2009  . Melanoma [facility])    sees [name]   . Numbness and tingling   . PROSTATE CANCER 10/05/2009   sees [name]  . Renal insufficiency    stage III [facility]     Past Surgical History:  Procedure Laterality Date  . ACROMIOPLASTY Right 05-06-14   per [name]   . BUNIONECTOMY     bilateral  . CATARACT EXTRACTION Left    per [name]   . COLONOSCOPY  07/10/2015   per [name], benign polyp, repeat in 10 yrs   . CORONARY STENT INTERVENTION N/A 01/17/2018   Procedure: CORONARY STENT INTERVENTION;  Surgeon: [name], MD;  Location: [facility];  Service: Cardiovascular;  Laterality: N/A;  . CYSTECTOMY     benign from hand  . GLAUCOMA SURGERY Bilateral 2014   . heart stent    . INSERTION OF MESH N/A 01/10/2013   Procedure: INSERTION OF MESH;  Surgeon: Earnstine Regal, MD;  Location: [place];  Service: General;  Laterality: N/A;  . KNEE ARTHROSCOPY Right Jan. 2013   right knee, per [name]   . KNEE ARTHROSCOPY Left 01-14-15   per [name]   . LEFT HEART CATH AND CORONARY ANGIOGRAPHY N/A 01/17/2018   Procedure: LEFT HEART CATH AND CORONARY ANGIOGRAPHY;  Surgeon: [name], MD;  Location: [facility];  Service: Cardiovascular;   Laterality: N/A;  . LEFT HEART CATH AND CORONARY ANGIOGRAPHY N/A 04/06/2018   Procedure: LEFT HEART CATH AND CORONARY ANGIOGRAPHY;  Surgeon: [name], MD;  Location: [facility];  Service: Cardiovascular;  Laterality: N/A;  . MELANOMA EXCISION  2012   per [name]  . PROSTATECTOMY     per [name]  . TONSILLECTOMY    . UMBILICAL HERNIA REPAIR  11/01/2011   Procedure: HERNIA REPAIR UMBILICAL ADULT;  Surgeon: Earnstine Regal, MD;  Location: [place] [facility];  Service: General;  Laterality: N/A;  Repair umbilical hernia with mesh patch  . VASECTOMY    . VENTRAL HERNIA REPAIR  01/10/2013   with mesh     [name]  . VENTRAL HERNIA REPAIR N/A 01/10/2013   Procedure: HERNIA REPAIR VENTRAL ADULT ;  Surgeon: Earnstine Regal, MD;  Location: [place];  Service: General;  Laterality: N/A;    MEDICATIONS: . aspirin EC 81 MG tablet  . bimatoprost (LUMIGAN) 0.01 % SOLN  . clopidogrel (PLAVIX) 75 MG tablet  . Coenzyme Q10 (COQ-10) 100  MG CAPS  . dorzolamide-timolol (COSOPT) 22.3-6.8 MG/ML ophthalmic solution  . famotidine (PEPCID) 20 MG tablet  . fenofibrate 160 MG tablet  . isosorbide mononitrate (IMDUR) 30 MG 24 hr tablet  . magnesium oxide (MAG-OX) 400 MG tablet  . Multiple Vitamins-Minerals (MULTIVITAMIN WITH MINERALS) tablet  . nitroGLYCERIN (NITROSTAT) 0.4 MG SL tablet  . pantoprazole (PROTONIX) 40 MG tablet  . rosuvastatin (CRESTOR) 20 MG tablet  . telmisartan (MICARDIS) 80 MG tablet   No current facility-administered medications for this encounter.    Maia Plan [facility] Pre-Surgical Testing [phone] 07/19/19  10:12 AM

## 2019-07-19 NOTE — Progress Notes (Signed)
Need orders in epic.  Surgery on 07/22/19

## 2019-07-19 NOTE — Progress Notes (Addendum)
Anesthesia Review:  PCP: Cardiologist :DR Jenkins Rouge  Clearance located telephone note 07/12/19 in epic.  LOV- Dr Johnsie Cancel 05/01/19  Chest x-ray : EKG :05/01/19  Echo :06/22/18  Cardiac Cath :  Stress Test - 01/07/19  Sleep Study/ CPAP : Fasting Blood Sugar :      / Checks Blood Sugar -- times a day:   Blood Thinner/ Instructions /Last Dose: ASA / Instructions/ Last Dose :  Plavix- last dose on 07/16/19 per instructions of Dr Johnsie Cancel per patient and wife  Patient denies shortness of breath, chest pain, fever, and cough at this phone interview.

## 2019-07-21 NOTE — H&P (Signed)
Patient's anticipated LOS is less than 2 midnights, meeting these requirements: - Younger than 64 - Lives within 1 hour of care - Has a competent adult at home to recover with post-op recover - NO history of  - Chronic pain requiring opiods  - Diabetes  - Coronary Artery Disease  - Heart failure  - Heart attack  - Stroke  - DVT/VTE  - Cardiac arrhythmia  - Respiratory Failure/COPD  - Renal failure  - Anemia  - Advanced Liver disease       [name] [name]. is an 74 y.o. male.    Chief Complaint: right knee pain  HPI: Pt is a 74 y.o. male complaining of right knee pain for multiple years. Pain had continually increased since the beginning. X-rays in the clinic show end-stage arthritic changes of the right knee. Pt has tried various conservative treatments which have failed to alleviate their symptoms, including injections and therapy. Various options are discussed with the patient. Risks, benefits and expectations were discussed with the patient. Patient understand the risks, benefits and expectations and wishes to proceed with surgery.   PCP:  [name], MD  D/C Plans: Home  PMH: Past Medical History:  Diagnosis Date  . ABNORMAL EKG 10/07/2008  . ADVEF, DRUG/MED/BIOL SUBST, OTHER DRUG NOS 09/26/2006  . Agent orange exposure   . Allergy    seasonal  . Cataract    left eye-surgery 07-2015  . DEGENERATIVE JOINT DISEASE, MODERATE 04/13/2007  . DIVERTICULOSIS, COLON 04/13/2007  . ELEVATED PROSTATE SPECIFIC ANTIGEN 04/14/2007  . GERD (gastroesophageal reflux disease)   . Glaucoma    sees [name]   . History of kidney stones    1970   . HYPERLIPIDEMIA 09/26/2006  . HYPERTENSION 04/13/2007  . JOINT EFFUSION, KNEE 06/15/2009  . Melanoma [facility])    sees [name]   . Numbness and tingling   . PROSTATE CANCER 10/05/2009   sees [name]  . Renal insufficiency    stage III [facility]     PSH: Past Surgical History:  Procedure Laterality Date  . ACROMIOPLASTY  Right 05-06-14   per [name]   . BUNIONECTOMY     bilateral  . CATARACT EXTRACTION Left    per [name]   . COLONOSCOPY  07/10/2015   per [name], benign polyp, repeat in 10 yrs   . CORONARY STENT INTERVENTION N/A 01/17/2018   Procedure: CORONARY STENT INTERVENTION;  Surgeon: [name], MD;  Location: [place] [facility];  Service: Cardiovascular;  Laterality: N/A;  . CYSTECTOMY     benign from hand  . GLAUCOMA SURGERY Bilateral 2014   . heart stent    . INSERTION OF MESH N/A 01/10/2013   Procedure: INSERTION OF MESH;  Surgeon: Earnstine Regal, MD;  Location: [place];  Service: General;  Laterality: N/A;  . KNEE ARTHROSCOPY Right Jan. 2013   right knee, per [name]   . KNEE ARTHROSCOPY Left 01-14-15   per [name]   . LEFT HEART CATH AND CORONARY ANGIOGRAPHY N/A 01/17/2018   Procedure: LEFT HEART CATH AND CORONARY ANGIOGRAPHY;  Surgeon: [name], MD;  Location: [place] [facility];  Service: Cardiovascular;  Laterality: N/A;  . LEFT HEART CATH AND CORONARY ANGIOGRAPHY N/A 04/06/2018   Procedure: LEFT HEART CATH AND CORONARY ANGIOGRAPHY;  Surgeon: [name], MD;  Location: [place] [facility];  Service: Cardiovascular;  Laterality: N/A;  . MELANOMA EXCISION  2012   per  [name]  . PROSTATECTOMY     per [name]  . TONSILLECTOMY    . UMBILICAL HERNIA REPAIR  11/01/2011   Procedure: HERNIA REPAIR UMBILICAL ADULT;  Surgeon: Earnstine Regal, MD;  Location: [place];  Service: General;  Laterality: N/A;  Repair umbilical hernia with mesh patch  . VASECTOMY    . VENTRAL HERNIA REPAIR  01/10/2013   with mesh     [name]  . VENTRAL HERNIA REPAIR N/A 01/10/2013   Procedure: HERNIA REPAIR VENTRAL ADULT ;  Surgeon: Earnstine Regal, MD;  Location: [place];  Service: General;  Laterality: N/A;    Social History:  reports that he quit smoking about 47 years ago. He started smoking about 56 years ago. He has a 9.00 pack-year smoking history. He has never  used smokeless tobacco. He reports current alcohol use. He reports that he does not use drugs.  Allergies:  No Known Allergies  Medications: No current facility-administered medications for this encounter.   Current Outpatient Medications  Medication Sig Dispense Refill  . aspirin EC 81 MG tablet Take 81 mg by mouth every evening.     . bimatoprost (LUMIGAN) 0.01 % SOLN Place 1 drop into both eyes at bedtime.    . clopidogrel (PLAVIX) 75 MG tablet Take 1 tablet (75 mg total) by mouth daily with breakfast. (Patient taking differently: Take 75 mg by mouth daily. ) 90 tablet 8  . Coenzyme Q10 (COQ-10) 100 MG CAPS Take 100 mg by mouth at bedtime.     . dorzolamide-timolol (COSOPT) 22.3-6.8 MG/ML ophthalmic solution Place 1 drop into both eyes 2 (two) times daily.     . famotidine (PEPCID) 20 MG tablet Take 20 mg by mouth at bedtime.     . fenofibrate 160 MG tablet Take 160 mg by mouth at bedtime.     . isosorbide mononitrate (IMDUR) 30 MG 24 hr tablet Take 1 tablet (30 mg total) by mouth daily. 90 tablet 3  . magnesium oxide (MAG-OX) 400 MG tablet Take 400 mg by mouth every evening.     . Multiple Vitamins-Minerals (MULTIVITAMIN WITH MINERALS) tablet Take 1 tablet by mouth 2 (two) times a week. Sundays and Wednesdays    . nitroGLYCERIN (NITROSTAT) 0.4 MG SL tablet Place 0.4 mg under the tongue every 5 (five) minutes as needed for chest pain.    . pantoprazole (PROTONIX) 40 MG tablet Take 1 tablet (40 mg total) by mouth daily. 90 tablet 3  . rosuvastatin (CRESTOR) 20 MG tablet Take 20 mg by mouth every evening.     Marland Kitchen telmisartan (MICARDIS) 80 MG tablet Take 1 tablet (80 mg total) by mouth daily. 90 tablet 4    No results found for this or any previous visit (from the past 48 hour(s)). No results found.  ROS: Pain with rom of the right lower extremity  Physical Exam: Alert and oriented 74 y.o. male in no acute distress Cranial nerves 2-12 intact Cervical spine: full rom with no  tenderness, nv intact distally Chest: active breath sounds bilaterally, no wheeze rhonchi or rales Heart: regular rate and rhythm, no murmur Abd: non tender non distended with active bowel sounds Hip is stable with rom  Right knee moderate crepitus and pain with rom nv intact distally No rashes or edema distally Antalgic gait  Assessment/Plan Assessment: right knee end stage osteoarthritis  Plan:  Patient will undergo a right total knee by [name] at [place] benefits and expectations  were discussed with the patient. Patient understand risks, benefits and expectations and wishes to proceed. Preoperative templating of the joint replacement has been completed, documented, and submitted to the Operating Room personnel in order to optimize intra-operative equipment management.   Merla Riches PA-C, MPAS [facility] is now [employer] [address], [place], [place], [place] [address] Phone: [phone] [email or web address] Facebook  [employer]

## 2019-07-22 ENCOUNTER — Other Ambulatory Visit: Payer: Self-pay

## 2019-07-22 ENCOUNTER — Encounter (HOSPITAL_COMMUNITY)
Admission: RE | Disposition: A | Discharge status: Home / Self Care | Payer: Self-pay | Source: Home / Self Care | Attending: Orthopedic Surgery

## 2019-07-22 ENCOUNTER — Inpatient Hospital Stay (HOSPITAL_COMMUNITY): Payer: Medicare Other | Admitting: Anesthesiology

## 2019-07-22 ENCOUNTER — Encounter (HOSPITAL_COMMUNITY): Payer: Self-pay | Admitting: Orthopedic Surgery

## 2019-07-22 ENCOUNTER — Inpatient Hospital Stay (HOSPITAL_COMMUNITY)
Admission: RE | Admit: 2019-07-22 | Discharge: 2019-07-24 | DRG: 470 | Disposition: A | Discharge status: Home / Self Care | Payer: Medicare Other | Attending: Orthopedic Surgery | Admitting: Orthopedic Surgery

## 2019-07-22 DIAGNOSIS — Z79899 Other long term (current) drug therapy: Secondary | ICD-10-CM

## 2019-07-22 DIAGNOSIS — Z85820 Personal history of malignant melanoma of skin: Secondary | ICD-10-CM

## 2019-07-22 DIAGNOSIS — Z96651 Presence of right artificial knee joint: Secondary | ICD-10-CM

## 2019-07-22 DIAGNOSIS — H409 Unspecified glaucoma: Secondary | ICD-10-CM | POA: Diagnosis not present

## 2019-07-22 DIAGNOSIS — Z955 Presence of coronary angioplasty implant and graft: Secondary | ICD-10-CM

## 2019-07-22 DIAGNOSIS — Z87442 Personal history of urinary calculi: Secondary | ICD-10-CM

## 2019-07-22 DIAGNOSIS — I10 Essential (primary) hypertension: Secondary | ICD-10-CM | POA: Diagnosis present

## 2019-07-22 DIAGNOSIS — Z9842 Cataract extraction status, left eye: Secondary | ICD-10-CM

## 2019-07-22 DIAGNOSIS — E785 Hyperlipidemia, unspecified: Secondary | ICD-10-CM | POA: Diagnosis not present

## 2019-07-22 DIAGNOSIS — M25761 Osteophyte, right knee: Secondary | ICD-10-CM | POA: Diagnosis present

## 2019-07-22 DIAGNOSIS — M1711 Unilateral primary osteoarthritis, right knee: Principal | ICD-10-CM | POA: Diagnosis present

## 2019-07-22 DIAGNOSIS — Z8546 Personal history of malignant neoplasm of prostate: Secondary | ICD-10-CM

## 2019-07-22 DIAGNOSIS — Z87891 Personal history of nicotine dependence: Secondary | ICD-10-CM

## 2019-07-22 DIAGNOSIS — K219 Gastro-esophageal reflux disease without esophagitis: Secondary | ICD-10-CM | POA: Diagnosis present

## 2019-07-22 DIAGNOSIS — G8918 Other acute postprocedural pain: Secondary | ICD-10-CM | POA: Diagnosis not present

## 2019-07-22 DIAGNOSIS — Z7902 Long term (current) use of antithrombotics/antiplatelets: Secondary | ICD-10-CM | POA: Diagnosis not present

## 2019-07-22 DIAGNOSIS — Z7982 Long term (current) use of aspirin: Secondary | ICD-10-CM

## 2019-07-22 HISTORY — DX: Personal history of urinary calculi: Z87.442

## 2019-07-22 HISTORY — DX: Gastro-esophageal reflux disease without esophagitis: K21.9

## 2019-07-22 HISTORY — PX: TOTAL KNEE ARTHROPLASTY: SHX125

## 2019-07-22 SURGERY — ARTHROPLASTY, KNEE, TOTAL
Anesthesia: Regional | Site: Knee | Laterality: Right

## 2019-07-22 MED ORDER — METHOCARBAMOL 500 MG IVPB - SIMPLE MED
500.0000 mg | Freq: Four times a day (QID) | INTRAVENOUS | Status: DC | PRN
  Filled 2019-07-22: qty 50

## 2019-07-22 MED ORDER — NITROGLYCERIN 0.4 MG SL SUBL: 0.4000 mg | SUBLINGUAL_TABLET | SUBLINGUAL | Status: DC | PRN

## 2019-07-22 MED ORDER — LATANOPROST 0.005 % OP SOLN
1.0000 [drp] | Freq: Every day | OPHTHALMIC | Status: DC
  Administered 2019-07-22 – 2019-07-23 (×2): 1 [drp] via OPHTHALMIC
  Filled 2019-07-22: qty 2.5

## 2019-07-22 MED ORDER — TRANEXAMIC ACID-NACL 1000-0.7 MG/100ML-% IV SOLN
1000.0000 mg | Freq: Once | INTRAVENOUS | Status: AC
  Administered 2019-07-22: 1000 mg via INTRAVENOUS
  Filled 2019-07-22: qty 100

## 2019-07-22 MED ORDER — SODIUM CHLORIDE 0.9 % IR SOLN
Status: DC | PRN
  Administered 2019-07-22: 3000 mL

## 2019-07-22 MED ORDER — CLONIDINE HCL (ANALGESIA) 100 MCG/ML EP SOLN
EPIDURAL | Status: DC | PRN
Start: 2019-07-22 — End: 2019-07-22
  Administered 2019-07-22: 100 ug

## 2019-07-22 MED ORDER — MIDAZOLAM HCL 2 MG/2ML IJ SOLN
INTRAMUSCULAR | Status: AC
  Filled 2019-07-22: qty 2

## 2019-07-22 MED ORDER — ROPIVACAINE HCL 7.5 MG/ML IJ SOLN
INTRAMUSCULAR | Status: DC | PRN
  Administered 2019-07-22: 20 mL via PERINEURAL

## 2019-07-22 MED ORDER — METOCLOPRAMIDE HCL 5 MG/ML IJ SOLN: 5.0000 mg | Freq: Three times a day (TID) | INTRAMUSCULAR | Status: DC | PRN

## 2019-07-22 MED ORDER — ACETAMINOPHEN 10 MG/ML IV SOLN: 1000.0000 mg | Freq: Once | INTRAVENOUS | Status: DC | PRN

## 2019-07-22 MED ORDER — PROPOFOL 500 MG/50ML IV EMUL
INTRAVENOUS | Status: DC | PRN
  Administered 2019-07-22: 50 ug/kg/min via INTRAVENOUS

## 2019-07-22 MED ORDER — SODIUM CHLORIDE 0.9 % IV SOLN: INTRAVENOUS | Status: DC

## 2019-07-22 MED ORDER — MEPIVACAINE HCL (PF) 2 % IJ SOLN
INTRAMUSCULAR | Status: AC
  Filled 2019-07-22: qty 20

## 2019-07-22 MED ORDER — ACETAMINOPHEN 325 MG PO TABS: 325.0000 mg | ORAL_TABLET | Freq: Four times a day (QID) | ORAL | Status: DC | PRN

## 2019-07-22 MED ORDER — CEFAZOLIN SODIUM-DEXTROSE 2-4 GM/100ML-% IV SOLN
2.0000 g | INTRAVENOUS | Status: AC
  Administered 2019-07-22: 2 g via INTRAVENOUS
  Filled 2019-07-22: qty 100

## 2019-07-22 MED ORDER — BISACODYL 10 MG RE SUPP
10.0000 mg | Freq: Every day | RECTAL | Status: DC | PRN
  Administered 2019-07-24: 10 mg via RECTAL
  Filled 2019-07-22: qty 1

## 2019-07-22 MED ORDER — ASPIRIN EC 81 MG PO TBEC: 81.0000 mg | DELAYED_RELEASE_TABLET | Freq: Two times a day (BID) | ORAL | 0 refills | Status: AC

## 2019-07-22 MED ORDER — GLYCOPYRROLATE PF 0.2 MG/ML IJ SOSY
PREFILLED_SYRINGE | INTRAMUSCULAR | Status: AC
  Filled 2019-07-22: qty 1

## 2019-07-22 MED ORDER — HYDROMORPHONE HCL 1 MG/ML IJ SOLN
0.5000 mg | INTRAMUSCULAR | Status: DC | PRN
  Administered 2019-07-22: 1 mg via INTRAVENOUS
  Filled 2019-07-22: qty 1

## 2019-07-22 MED ORDER — FENTANYL CITRATE (PF) 250 MCG/5ML IJ SOLN
INTRAMUSCULAR | Status: AC
  Filled 2019-07-22: qty 5

## 2019-07-22 MED ORDER — PROPOFOL 10 MG/ML IV BOLUS
INTRAVENOUS | Status: DC | PRN
  Administered 2019-07-22: 10 mg via INTRAVENOUS
  Administered 2019-07-22 (×2): 20 mg via INTRAVENOUS

## 2019-07-22 MED ORDER — MENTHOL 3 MG MT LOZG: 1.0000 | LOZENGE | OROMUCOSAL | Status: DC | PRN

## 2019-07-22 MED ORDER — FENTANYL CITRATE (PF) 100 MCG/2ML IJ SOLN: 25.0000 ug | INTRAMUSCULAR | Status: DC | PRN

## 2019-07-22 MED ORDER — BUPIVACAINE IN DEXTROSE 0.75-8.25 % IT SOLN
INTRATHECAL | Status: DC | PRN
Start: 2019-07-22 — End: 2019-07-22
  Administered 2019-07-22: 1.6 mL via INTRATHECAL

## 2019-07-22 MED ORDER — FAMOTIDINE 20 MG PO TABS
20.0000 mg | ORAL_TABLET | Freq: Every day | ORAL | Status: DC
  Administered 2019-07-22 – 2019-07-23 (×2): 20 mg via ORAL
  Filled 2019-07-22 (×2): qty 1

## 2019-07-22 MED ORDER — ONDANSETRON HCL 4 MG/2ML IJ SOLN: 4.0000 mg | Freq: Once | INTRAMUSCULAR | Status: DC | PRN

## 2019-07-22 MED ORDER — SUGAMMADEX SODIUM 500 MG/5ML IV SOLN
INTRAVENOUS | Status: AC
  Filled 2019-07-22: qty 5

## 2019-07-22 MED ORDER — CEFAZOLIN SODIUM-DEXTROSE 2-4 GM/100ML-% IV SOLN
2.0000 g | Freq: Four times a day (QID) | INTRAVENOUS | Status: AC
  Administered 2019-07-22 – 2019-07-23 (×2): 2 g via INTRAVENOUS
  Filled 2019-07-22 (×2): qty 100

## 2019-07-22 MED ORDER — METHOCARBAMOL 500 MG PO TABS: 500.0000 mg | ORAL_TABLET | Freq: Three times a day (TID) | ORAL | 1 refills | Status: DC | PRN

## 2019-07-22 MED ORDER — BUPIVACAINE-EPINEPHRINE (PF) 0.5% -1:200000 IJ SOLN: INTRAMUSCULAR | Status: DC | PRN

## 2019-07-22 MED ORDER — POVIDONE-IODINE 10 % EX SWAB
2.0000 "application " | Freq: Once | CUTANEOUS | Status: AC
  Administered 2019-07-22: 2 via TOPICAL

## 2019-07-22 MED ORDER — DORZOLAMIDE HCL-TIMOLOL MAL 2-0.5 % OP SOLN
1.0000 [drp] | Freq: Two times a day (BID) | OPHTHALMIC | Status: DC
  Administered 2019-07-22 – 2019-07-24 (×4): 1 [drp] via OPHTHALMIC
  Filled 2019-07-22: qty 10

## 2019-07-22 MED ORDER — BUPIVACAINE-EPINEPHRINE 0.5% -1:200000 IJ SOLN
INTRAMUSCULAR | Status: AC
  Filled 2019-07-22: qty 1

## 2019-07-22 MED ORDER — POLYETHYLENE GLYCOL 3350 17 G PO PACK: 17.0000 g | PACK | Freq: Every day | ORAL | Status: DC | PRN

## 2019-07-22 MED ORDER — MIDAZOLAM HCL 2 MG/2ML IJ SOLN
INTRAMUSCULAR | Status: DC | PRN
  Administered 2019-07-22 (×2): 1 mg via INTRAVENOUS

## 2019-07-22 MED ORDER — LIDOCAINE 2% (20 MG/ML) 5 ML SYRINGE
INTRAMUSCULAR | Status: DC | PRN
  Administered 2019-07-22: 40 mg via INTRAVENOUS

## 2019-07-22 MED ORDER — COQ-10 100 MG PO CAPS: 100.0000 mg | ORAL_CAPSULE | Freq: Every day | ORAL | Status: DC

## 2019-07-22 MED ORDER — LACTATED RINGERS IV SOLN: INTRAVENOUS | Status: DC

## 2019-07-22 MED ORDER — DOCUSATE SODIUM 100 MG PO CAPS
100.0000 mg | ORAL_CAPSULE | Freq: Two times a day (BID) | ORAL | Status: DC
  Administered 2019-07-22 – 2019-07-24 (×4): 100 mg via ORAL
  Filled 2019-07-22 (×4): qty 1

## 2019-07-22 MED ORDER — FERROUS SULFATE 325 (65 FE) MG PO TABS
325.0000 mg | ORAL_TABLET | Freq: Three times a day (TID) | ORAL | Status: DC
  Administered 2019-07-23 – 2019-07-24 (×3): 325 mg via ORAL
  Filled 2019-07-22 (×4): qty 1

## 2019-07-22 MED ORDER — PROPOFOL 10 MG/ML IV BOLUS
INTRAVENOUS | Status: AC
  Filled 2019-07-22: qty 20

## 2019-07-22 MED ORDER — METOCLOPRAMIDE HCL 5 MG PO TABS: 5.0000 mg | ORAL_TABLET | Freq: Three times a day (TID) | ORAL | Status: DC | PRN

## 2019-07-22 MED ORDER — TRANEXAMIC ACID-NACL 1000-0.7 MG/100ML-% IV SOLN
1000.0000 mg | INTRAVENOUS | Status: AC
  Administered 2019-07-22: 1000 mg via INTRAVENOUS
  Filled 2019-07-22: qty 100

## 2019-07-22 MED ORDER — ONDANSETRON HCL 4 MG PO TABS: 4.0000 mg | ORAL_TABLET | Freq: Four times a day (QID) | ORAL | Status: DC | PRN

## 2019-07-22 MED ORDER — PHENOL 1.4 % MT LIQD: 1.0000 | OROMUCOSAL | Status: DC | PRN

## 2019-07-22 MED ORDER — FENTANYL CITRATE (PF) 250 MCG/5ML IJ SOLN
INTRAMUSCULAR | Status: DC | PRN
  Administered 2019-07-22: 100 ug via INTRAVENOUS

## 2019-07-22 MED ORDER — MAGNESIUM OXIDE 400 (241.3 MG) MG PO TABS
400.0000 mg | ORAL_TABLET | Freq: Every evening | ORAL | Status: DC
  Administered 2019-07-22 – 2019-07-23 (×2): 400 mg via ORAL
  Filled 2019-07-22 (×2): qty 1

## 2019-07-22 MED ORDER — EPHEDRINE SULFATE-NACL 50-0.9 MG/10ML-% IV SOSY
PREFILLED_SYRINGE | INTRAVENOUS | Status: DC | PRN
  Administered 2019-07-22 (×2): 10 mg via INTRAVENOUS

## 2019-07-22 MED ORDER — ADULT MULTIVITAMIN W/MINERALS CH: 1.0000 | ORAL_TABLET | ORAL | Status: DC

## 2019-07-22 MED ORDER — IRBESARTAN 150 MG PO TABS
300.0000 mg | ORAL_TABLET | Freq: Every day | ORAL | Status: DC
  Administered 2019-07-23 – 2019-07-24 (×2): 300 mg via ORAL
  Filled 2019-07-22 (×2): qty 2

## 2019-07-22 MED ORDER — 0.9 % SODIUM CHLORIDE (POUR BTL) OPTIME
TOPICAL | Status: DC | PRN
  Administered 2019-07-22: 1000 mL

## 2019-07-22 MED ORDER — FENOFIBRATE 160 MG PO TABS
160.0000 mg | ORAL_TABLET | Freq: Every day | ORAL | Status: DC
  Administered 2019-07-22 – 2019-07-23 (×2): 160 mg via ORAL
  Filled 2019-07-22 (×2): qty 1

## 2019-07-22 MED ORDER — ISOSORBIDE MONONITRATE ER 30 MG PO TB24
30.0000 mg | ORAL_TABLET | Freq: Every day | ORAL | Status: DC
  Administered 2019-07-23 – 2019-07-24 (×2): 30 mg via ORAL
  Filled 2019-07-22 (×2): qty 1

## 2019-07-22 MED ORDER — MIDAZOLAM HCL 2 MG/2ML IJ SOLN
1.0000 mg | INTRAMUSCULAR | Status: DC
  Filled 2019-07-22: qty 2

## 2019-07-22 MED ORDER — ONDANSETRON HCL 4 MG/2ML IJ SOLN: 4.0000 mg | Freq: Four times a day (QID) | INTRAMUSCULAR | Status: DC | PRN

## 2019-07-22 MED ORDER — OXYCODONE HCL 5 MG PO TABS
5.0000 mg | ORAL_TABLET | ORAL | Status: DC | PRN
  Administered 2019-07-22 – 2019-07-24 (×9): 10 mg via ORAL
  Filled 2019-07-22 (×10): qty 2

## 2019-07-22 MED ORDER — PHENYLEPHRINE 40 MCG/ML (10ML) SYRINGE FOR IV PUSH (FOR BLOOD PRESSURE SUPPORT)
PREFILLED_SYRINGE | INTRAVENOUS | Status: AC
  Filled 2019-07-22: qty 10

## 2019-07-22 MED ORDER — FENTANYL CITRATE (PF) 100 MCG/2ML IJ SOLN
50.0000 ug | INTRAMUSCULAR | Status: DC
  Administered 2019-07-22: 50 ug via INTRAVENOUS
  Filled 2019-07-22: qty 2

## 2019-07-22 MED ORDER — WATER FOR IRRIGATION, STERILE IR SOLN
Status: DC | PRN
  Administered 2019-07-22: 2000 mL

## 2019-07-22 MED ORDER — METHOCARBAMOL 500 MG PO TABS
500.0000 mg | ORAL_TABLET | Freq: Four times a day (QID) | ORAL | Status: DC | PRN
  Administered 2019-07-22 – 2019-07-23 (×3): 500 mg via ORAL
  Filled 2019-07-22 (×3): qty 1

## 2019-07-22 MED ORDER — PANTOPRAZOLE SODIUM 40 MG PO TBEC
40.0000 mg | DELAYED_RELEASE_TABLET | Freq: Every day | ORAL | Status: DC
  Administered 2019-07-23 – 2019-07-24 (×2): 40 mg via ORAL
  Filled 2019-07-22 (×2): qty 1

## 2019-07-22 MED ORDER — ROSUVASTATIN CALCIUM 20 MG PO TABS
20.0000 mg | ORAL_TABLET | Freq: Every evening | ORAL | Status: DC
  Administered 2019-07-22 – 2019-07-23 (×2): 20 mg via ORAL
  Filled 2019-07-22 (×2): qty 1

## 2019-07-22 MED ORDER — ONDANSETRON HCL 4 MG/2ML IJ SOLN
INTRAMUSCULAR | Status: AC
  Filled 2019-07-22: qty 4

## 2019-07-22 MED ORDER — OXYCODONE-ACETAMINOPHEN 5-325 MG PO TABS: 1.0000 | ORAL_TABLET | ORAL | 0 refills | Status: DC | PRN

## 2019-07-22 MED ORDER — ONDANSETRON HCL 4 MG/2ML IJ SOLN
INTRAMUSCULAR | Status: DC | PRN
  Administered 2019-07-22: 4 mg via INTRAVENOUS

## 2019-07-22 SURGICAL SUPPLY — 57 items
BAG SPEC THK2 15X12 ZIP CLS (MISCELLANEOUS)
BAG ZIPLOCK 12X15 (MISCELLANEOUS) IMPLANT
BLADE SAG 18X100X1.27 (BLADE) ×3 IMPLANT
BLADE SAW SGTL 13X75X1.27 (BLADE) ×3 IMPLANT
BNDG CMPR MED 10X6 ELC LF (GAUZE/BANDAGES/DRESSINGS) ×1
BNDG ELASTIC 6X10 VLCR STRL LF (GAUZE/BANDAGES/DRESSINGS) ×3 IMPLANT
BNDG GAUZE ELAST 4 BULKY (GAUZE/BANDAGES/DRESSINGS) ×3 IMPLANT
BOWL SMART MIX CTS (DISPOSABLE) ×3 IMPLANT
CEMENT HV SMART SET (Cement) ×6 IMPLANT
CEMENT TIBIA MBT SIZE 5 (Knees) IMPLANT
CLOSURE STERI-STRIP 1/2X4 (GAUZE/BANDAGES/DRESSINGS) ×1
CLOSURE WOUND 1/2 X4 (GAUZE/BANDAGES/DRESSINGS) ×2
CLSR STERI-STRIP ANTIMIC 1/2X4 (GAUZE/BANDAGES/DRESSINGS) ×1 IMPLANT
COVER SURGICAL LIGHT HANDLE (MISCELLANEOUS) ×3 IMPLANT
COVER WAND RF STERILE (DRAPES) IMPLANT
CUFF TOURN SGL QUICK 34 (TOURNIQUET CUFF) ×3
CUFF TRNQT CYL 34X4.125X (TOURNIQUET CUFF) ×1 IMPLANT
DRAPE SHEET LG 3/4 BI-LAMINATE (DRAPES) ×3 IMPLANT
DRAPE U-SHAPE 47X51 STRL (DRAPES) ×3 IMPLANT
DRSG ADAPTIC 3X8 NADH LF (GAUZE/BANDAGES/DRESSINGS) ×3 IMPLANT
DRSG PAD ABDOMINAL 8X10 ST (GAUZE/BANDAGES/DRESSINGS) ×3 IMPLANT
DURAPREP 26ML APPLICATOR (WOUND CARE) ×3 IMPLANT
ELECT REM PT RETURN 15FT ADLT (MISCELLANEOUS) ×3 IMPLANT
FEMUR SIGMA PS SZ 4.0 R (Femur) ×2 IMPLANT
GAUZE SPONGE 4X4 12PLY STRL (GAUZE/BANDAGES/DRESSINGS) ×3 IMPLANT
GLOVE BIOGEL PI ORTHO PRO 7.5 (GLOVE) ×2
GLOVE BIOGEL PI ORTHO PRO SZ8 (GLOVE) ×2
GLOVE ORTHO TXT STRL SZ7.5 (GLOVE) ×3 IMPLANT
GLOVE PI ORTHO PRO STRL 7.5 (GLOVE) ×1 IMPLANT
GLOVE PI ORTHO PRO STRL SZ8 (GLOVE) ×1 IMPLANT
GLOVE SURG ORTHO 8.5 STRL (GLOVE) ×6 IMPLANT
GOWN STRL REUS W/TWL XL LVL3 (GOWN DISPOSABLE) ×6 IMPLANT
HANDPIECE INTERPULSE COAX TIP (DISPOSABLE) ×3
HOLDER FOLEY CATH W/STRAP (MISCELLANEOUS) IMPLANT
IMMOBILIZER KNEE 20 (SOFTGOODS)
IMMOBILIZER KNEE 20 THIGH 36 (SOFTGOODS) IMPLANT
KIT TURNOVER KIT A (KITS) IMPLANT
MANIFOLD NEPTUNE II (INSTRUMENTS) ×3 IMPLANT
NS IRRIG 1000ML POUR BTL (IV SOLUTION) ×3 IMPLANT
PACK TOTAL KNEE CUSTOM (KITS) ×3 IMPLANT
PATELLA DOME PFC 38MM (Knees) ×2 IMPLANT
PENCIL SMOKE EVACUATOR (MISCELLANEOUS) IMPLANT
PIN STEINMAN FIXATION KNEE (PIN) ×2 IMPLANT
PLATE ROT INSERT 12.5MM SIZE 4 (Plate) ×2 IMPLANT
PROTECTOR NERVE ULNAR (MISCELLANEOUS) ×3 IMPLANT
SET HNDPC FAN SPRY TIP SCT (DISPOSABLE) ×1 IMPLANT
STAPLER VISISTAT 35W (STAPLE) IMPLANT
STRIP CLOSURE SKIN 1/2X4 (GAUZE/BANDAGES/DRESSINGS) ×4 IMPLANT
SUT MNCRL AB 3-0 PS2 18 (SUTURE) ×3 IMPLANT
SUT VIC AB 0 CT1 36 (SUTURE) ×3 IMPLANT
SUT VIC AB 1 CT1 36 (SUTURE) ×9 IMPLANT
SUT VIC AB 2-0 CT1 27 (SUTURE) ×3
SUT VIC AB 2-0 CT1 TAPERPNT 27 (SUTURE) ×1 IMPLANT
TIBIA MBT CEMENT SIZE 5 (Knees) ×3 IMPLANT
TRAY FOLEY MTR SLVR 16FR STAT (SET/KITS/TRAYS/PACK) ×3 IMPLANT
WATER STERILE IRR 1000ML POUR (IV SOLUTION) ×6 IMPLANT
YANKAUER SUCT BULB TIP 10FT TU (MISCELLANEOUS) ×3 IMPLANT

## 2019-07-22 NOTE — Anesthesia Procedure Notes (Signed)
Spinal  Patient location during procedure: OR Start time: 07/22/2019 2:15 PM End time: 07/22/2019 2:31 PM Staffing Performed: anesthesiologist  Preanesthetic Checklist Completed: patient identified, IV checked, site marked, risks and benefits discussed, surgical consent, monitors and equipment checked, pre-op evaluation and timeout performed Spinal Block Patient position: sitting Prep: DuraPrep Patient monitoring: heart rate, cardiac monitor, continuous pulse ox and blood pressure Approach: midline Location: L3-4 Injection technique: single-shot Needle Needle type: Sprotte  Needle gauge: 24 G Needle length: 9 cm Needle insertion depth: 6 cm Assessment Sensory level: T4 Additional Notes 3 attempts . Succesfull on last attempt. Pt tolerated procedure

## 2019-07-22 NOTE — Transfer of Care (Signed)
Immediate Anesthesia Transfer of Care Note  Patient: [name] [name].  Procedure(s) Performed: TOTAL KNEE ARTHROPLASTY (Right Knee)  Patient Location: PACU  Anesthesia Type:Spinal  Level of Consciousness: awake and alert   Airway & Oxygen Therapy: Patient Spontanous Breathing and Patient connected to face mask oxygen  Post-op Assessment: Report given to RN and Post -op Vital signs reviewed and stable  Post vital signs: Reviewed and stable  Last Vitals:  Vitals Value Taken Time  BP 116/65 07/22/19 1635  Temp    Pulse 49 07/22/19 1637  Resp 11 07/22/19 1637  SpO2 100 % 07/22/19 1637  Vitals shown include unvalidated device data.  Last Pain:  Vitals:   07/22/19 1400  TempSrc:   PainSc: 0-No pain         Complications: No apparent anesthesia complications

## 2019-07-22 NOTE — Care Plan (Signed)
Ortho Bundle Case Management Note  Patient Details  Name: [name] [name]. MRN: [id number] Date of Birth: Aug 04, 1945  R TKA on 07-22-19 DCP:  Home with wife.  1 story home with 4-5 ste.   DME:  Borrowing a RW.  3-in-1 ordered through [place]. PT:  [facility].  PT eval scheduled on 07-25-19.                   DME Arranged:  3-N-1 DME Agency:  [employer]  HH Arranged:  NA [place] Agency:  NA  Additional Comments: Please contact me with any questions of if this plan should need to change.  Marianne Sofia, RN,CCM [facility]  [phone] 07/22/2019, 3:05 PM

## 2019-07-22 NOTE — Anesthesia Procedure Notes (Signed)
Date/Time: 07/22/2019 2:14 PM Performed by: Cynda Familia, CRNA Pre-anesthesia Checklist: Patient identified, Emergency Drugs available, Suction available, Timeout performed and Patient being monitored Patient Re-evaluated:Patient Re-evaluated prior to induction Oxygen Delivery Method: Simple face mask Placement Confirmation: positive ETCO2 and breath sounds checked- equal and bilateral Dental Injury: Teeth and Oropharynx as per pre-operative assessment

## 2019-07-22 NOTE — Interval H&P Note (Signed)
History and Physical Interval Note:  07/22/2019 2:07 PM  [name] [name].  has presented today for surgery, with the diagnosis of Right knee endstage osteoarthritis.  The various methods of treatment have been discussed with the patient and family. After consideration of risks, benefits and other options for treatment, the patient has consented to  Procedure(s): TOTAL KNEE ARTHROPLASTY (Right) as a surgical intervention.  The patient's history has been reviewed, patient examined, no change in status, stable for surgery.  I have reviewed the patient's chart and labs.  Questions were answered to the patient's satisfaction.     [name]

## 2019-07-22 NOTE — Anesthesia Preprocedure Evaluation (Addendum)
Anesthesia Evaluation  Patient identified by MRN, date of birth, ID band Patient awake    Reviewed: Allergy & Precautions, NPO status , Patient's Chart, lab work & pertinent test results  Airway Mallampati: II  TM Distance: >3 FB     Dental  (+) Dental Advisory Given, Teeth Intact, Implants   Pulmonary former smoker,    breath sounds clear to auscultation       Cardiovascular hypertension, Pt. on medications + CAD and + Cardiac Stents   Rhythm:Regular Rate:Normal     Neuro/Psych negative neurological ROS     GI/Hepatic Neg liver ROS, GERD  Medicated,  Endo/Other  negative endocrine ROS  Renal/GU CRFRenal disease     Musculoskeletal  (+) Arthritis ,   Abdominal   Peds  Hematology negative hematology ROS (+)   Anesthesia Other Findings   Reproductive/Obstetrics                            Lab Results  Component Value Date   WBC 6.8 07/19/2019   HGB 14.5 07/19/2019   HCT 45.3 07/19/2019   MCV 91.9 07/19/2019   PLT 230 07/19/2019   Lab Results  Component Value Date   CREATININE 1.71 (H) 07/19/2019   BUN 26 (H) 07/19/2019   NA 139 07/19/2019   K 4.2 07/19/2019   CL 107 07/19/2019   CO2 23 07/19/2019   Cardiopulmonary Exercise Test 01/07/2019 Conclusion: Exercise testing with gas exchange demonstrates normal functional capacity when compared to matched sedentary norms. There is no clear cardiopulmonary limitation. Patient appears to have met physiological ventilatory limits at peak exercise with relation most like to body habitus. Patient would likely benefit from routine moderate intensity exercise. Note, there was a hypertensive response at peak exercise.   Echo 06/22/2018 IMPRESSIONS    1. The left ventricle has normal systolic function with an ejection  fraction of 60-65%. The cavity size was normal. Left ventricular diastolic  Doppler parameters are indeterminate.  2. The  right ventricle has normal systolic function. The cavity was  normal. There is no increase in right ventricular wall thickness.  3. The aortic valve is tricuspid. Mild thickening of the aortic valve.  Aortic valve regurgitation is mild by color flow Doppler.  Cardiac Cath 04/06/2018  Widely patent mid circumflex drug-eluting stent. The circumflex beyond the stent is unchanged from before with a 50 to 70% distal bifurcation stenosis in a [place] 110 configuration. The circumflex is otherwise patent.  Left main is normal.  LAD is widely patent. A branch of the first diagonal contains ostial 80% narrowing. This tertiary branches are small.  RCA is widely patent with distal PDA and LV branch diffuse disease. Normal LV function and LVEDP 10 mmHg.  Anesthesia Physical Anesthesia Plan  ASA: III  Anesthesia Plan: Spinal and Regional   Post-op Pain Management:    Induction: Intravenous  PONV Risk Score and Plan: Treatment may vary due to age or medical condition and Ondansetron  Airway Management Planned: Nasal Cannula and Natural Airway  Additional Equipment: None  Intra-op Plan:   Post-operative Plan:   Informed Consent: I have reviewed the patients History and Physical, chart, labs and discussed the procedure including the risks, benefits and alternatives for the proposed anesthesia with the patient or authorized representative who has indicated his/her understanding and acceptance.     Dental advisory given  Plan Discussed with: CRNA  Anesthesia Plan Comments: (Off of plavix and ASA since 5/11  Spinal + R adductor canal block)       Anesthesia Quick Evaluation

## 2019-07-22 NOTE — Anesthesia Procedure Notes (Addendum)
Anesthesia Regional Block: Adductor canal block   Pre-Anesthetic Checklist: ,, timeout performed, Correct Patient, Correct Site, Correct Laterality, Correct Procedure, Correct Position, site marked, Risks and benefits discussed,  Surgical consent,  Pre-op evaluation,  At surgeon's request and post-op pain management  Laterality: Lower and Right  Prep: chloraprep       Needles:  Injection technique: Single-shot  Needle Type: Echogenic Needle     Needle Length: 9cm  Needle Gauge: 22     Additional Needles:   Procedures:,,,, ultrasound used (permanent image in chart),,,,  Narrative:  Start time: 07/22/2019 1:25 PM End time: 07/22/2019 1:35 PM Injection made incrementally with aspirations every 5 mL.  Performed by: Personally  Anesthesiologist: Barnet Glasgow, MD  Additional Notes: Block assessed prior to surgery. Pt tolerated procedure well.

## 2019-07-22 NOTE — Progress Notes (Signed)
Orthopedic Tech Progress Note Patient Details:  [name] [name] May 10, 1945 [id number]  Patient ID: [name] Howells., male   DOB: 1945-11-06, 74 y.o.   MRN: [id number]   [name] [name] 07/22/2019, 8:38 PM cpm removed at 2040

## 2019-07-22 NOTE — Plan of Care (Signed)

## 2019-07-22 NOTE — Anesthesia Postprocedure Evaluation (Signed)
Anesthesia Post Note  Patient: [name] [name].  Procedure(s) Performed: TOTAL KNEE ARTHROPLASTY (Right Knee)     Patient location during evaluation: Nursing Unit Anesthesia Type: Regional and Spinal Level of consciousness: oriented and awake and alert Pain management: pain level controlled Vital Signs Assessment: post-procedure vital signs reviewed and stable Respiratory status: spontaneous breathing and respiratory function stable Cardiovascular status: blood pressure returned to baseline and stable Postop Assessment: no headache, no backache, no apparent nausea or vomiting and patient able to bend at knees Anesthetic complications: no    Last Vitals:  Vitals:   07/22/19 1812 07/22/19 1851  BP: 140/85 (!) 150/89  Pulse: (!) 44 (!) 43  Resp: 16 16  Temp: 36.4 C   SpO2: 100% 100%    Last Pain:  Vitals:   07/22/19 1929  TempSrc:   PainSc: 5                  Barnet Glasgow

## 2019-07-22 NOTE — Progress Notes (Signed)
Dr Valma Cava at bedside evaluated and talked to pt. Dr Valma Cava aware HR in 40's, and pt awake alert and oriented.  Pt reports baseline HR in 50's. Dr Valma Cava reports that he is aware of heart rate and pt stable to transfer to floor.

## 2019-07-22 NOTE — Progress Notes (Signed)
AssistedDr. Houser with right, ultrasound guided, adductor canal block. Side rails up, monitors on throughout procedure. See vital signs in flow sheet. Tolerated Procedure well.  

## 2019-07-22 NOTE — Discharge Instructions (Signed)
Ice to the knee constantly.  Keep the incision covered and clean and dry for one week, then ok to get it wet in the shower.  Do exercise as instructed every hour, please to prevent stiffness.   Use the knee immobilizer at night to maintain straightening(extension)  DO NOT prop anything under the knee, it will make your knee stiff.  Prop under the ankle to encourage your knee to go straight.   Use the walker while you are up and around for balance.  Wear your support stockings 24/7 to prevent blood clots and take baby aspirin twice daily for 30 days also to prevent blood clots  Follow up with Dr [name] in two weeks in the office, call [phone] for appt

## 2019-07-22 NOTE — Brief Op Note (Signed)
07/22/2019  4:14 PM  PATIENT:  [name] [name].  74 y.o. male  PRE-OPERATIVE DIAGNOSIS:  Right knee endstage osteoarthritis  POST-OPERATIVE DIAGNOSIS:  Right knee endstage osteoarthritis  PROCEDURE:  Procedure(s): TOTAL KNEE ARTHROPLASTY (Right) DePuy Sigma RP  SURGEON:  Surgeon(s) and Role:    Netta Cedars, MD - Primary  PHYSICIAN ASSISTANT:   ASSISTANTS: Ventura Bruns, PA-C   ANESTHESIA:   spinal and adductor canal block  EBL:  50 mL   BLOOD ADMINISTERED:none  DRAINS: none   LOCAL MEDICATIONS USED:  NONE  SPECIMEN:  No Specimen  DISPOSITION OF SPECIMEN:  N/A  COUNTS:  YES  TOURNIQUET:  * Missing tourniquet times found for documented tourniquets in log: [id number] *  DICTATION: .Other Dictation: Dictation Number [id number]  PLAN OF CARE: Admit for overnight observation  PATIENT DISPOSITION:  PACU - hemodynamically stable.   Delay start of Pharmacological VTE agent (>24hrs) due to surgical blood loss or risk of bleeding: no

## 2019-07-23 ENCOUNTER — Encounter: Payer: Self-pay | Admitting: *Deleted

## 2019-07-23 DIAGNOSIS — K219 Gastro-esophageal reflux disease without esophagitis: Secondary | ICD-10-CM | POA: Diagnosis present

## 2019-07-23 DIAGNOSIS — Z9842 Cataract extraction status, left eye: Secondary | ICD-10-CM | POA: Diagnosis not present

## 2019-07-23 DIAGNOSIS — Z79899 Other long term (current) drug therapy: Secondary | ICD-10-CM | POA: Diagnosis not present

## 2019-07-23 DIAGNOSIS — Z87891 Personal history of nicotine dependence: Secondary | ICD-10-CM | POA: Diagnosis not present

## 2019-07-23 DIAGNOSIS — M25761 Osteophyte, right knee: Secondary | ICD-10-CM | POA: Diagnosis present

## 2019-07-23 DIAGNOSIS — I10 Essential (primary) hypertension: Secondary | ICD-10-CM | POA: Diagnosis present

## 2019-07-23 DIAGNOSIS — Z85820 Personal history of malignant melanoma of skin: Secondary | ICD-10-CM | POA: Diagnosis not present

## 2019-07-23 DIAGNOSIS — Z7982 Long term (current) use of aspirin: Secondary | ICD-10-CM | POA: Diagnosis not present

## 2019-07-23 DIAGNOSIS — Z955 Presence of coronary angioplasty implant and graft: Secondary | ICD-10-CM | POA: Diagnosis not present

## 2019-07-23 DIAGNOSIS — Z8546 Personal history of malignant neoplasm of prostate: Secondary | ICD-10-CM | POA: Diagnosis not present

## 2019-07-23 DIAGNOSIS — H409 Unspecified glaucoma: Secondary | ICD-10-CM | POA: Diagnosis present

## 2019-07-23 DIAGNOSIS — Z7902 Long term (current) use of antithrombotics/antiplatelets: Secondary | ICD-10-CM | POA: Diagnosis not present

## 2019-07-23 DIAGNOSIS — E785 Hyperlipidemia, unspecified: Secondary | ICD-10-CM | POA: Diagnosis present

## 2019-07-23 DIAGNOSIS — Z87442 Personal history of urinary calculi: Secondary | ICD-10-CM | POA: Diagnosis not present

## 2019-07-23 DIAGNOSIS — M1711 Unilateral primary osteoarthritis, right knee: Secondary | ICD-10-CM | POA: Diagnosis present

## 2019-07-23 LAB — CBC
HCT: 37.2 % — ABNORMAL LOW (ref 39.0–52.0)
Hemoglobin: 12.2 g/dL — ABNORMAL LOW (ref 13.0–17.0)
MCH: 30 pg (ref 26.0–34.0)
MCHC: 32.8 g/dL (ref 30.0–36.0)
MCV: 91.6 fL (ref 80.0–100.0)
Platelets: 176 10*3/uL (ref 150–400)
RBC: 4.06 MIL/uL — ABNORMAL LOW (ref 4.22–5.81)
RDW: 13.1 % (ref 11.5–15.5)
WBC: 10.5 10*3/uL (ref 4.0–10.5)
nRBC: 0 % (ref 0.0–0.2)

## 2019-07-23 LAB — BASIC METABOLIC PANEL
Anion gap: 9 (ref 5–15)
BUN: 15 mg/dL (ref 8–23)
CO2: 20 mmol/L — ABNORMAL LOW (ref 22–32)
Calcium: 8.4 mg/dL — ABNORMAL LOW (ref 8.9–10.3)
Chloride: 110 mmol/L (ref 98–111)
Creatinine, Ser: 1.42 mg/dL — ABNORMAL HIGH (ref 0.61–1.24)
GFR calc Af Amer: 57 mL/min — ABNORMAL LOW (ref >60)
GFR calc non Af Amer: 49 mL/min — ABNORMAL LOW (ref >60)
Glucose, Bld: 145 mg/dL — ABNORMAL HIGH (ref 70–99)
Potassium: 3.8 mmol/L (ref 3.5–5.1)
Sodium: 139 mmol/L (ref 135–145)

## 2019-07-23 NOTE — Progress Notes (Signed)
Orthopedic Tech Progress Note Patient Details:  [name] [name] 1945-08-03 [id number]  Patient ID: [name], male   DOB: [birth year], 74 y.o.   MRN: [id number]   [name] 07/23/2019, 7:28 PM Pt placed in cpm.

## 2019-07-23 NOTE — Progress Notes (Signed)
Physical Therapy Treatment Patient Details Name: [name] [name]. MRN: [id number] DOB: 25-Mar-1945 Today's Date: 07/23/2019    History of Present Illness S/P rightTKA on 07/22/19. H/O HTN,    PT Comments    The patient is progressing well with mobility, reports pain is  Improved. Plans Dc tomorrow and  will practice steps in AM.' Reviewed position of right leg in bed and recliner to reinforce knee extension.  Follow Up Recommendations  Follow surgeon's recommendation for DC plan and follow-up therapies     Equipment Recommendations  3in1 (PT)    Recommendations for Other Services       Precautions / Restrictions Precautions Precautions: Knee;Fall Precaution Comments: wears briefs w/pad Required Braces or Orthoses: Knee Immobilizer - Right Knee Immobilizer - Right: On when out of bed or walking Restrictions Weight Bearing Restrictions: Yes RLE Weight Bearing: Weight bearing as tolerated    Mobility  Bed Mobility   Bed Mobility: Sit to Supine       Sit to supine: Min guard   General bed mobility comments: used  belt to lift right leg onto bed  Transfers Overall transfer level: Needs assistance Equipment used: Rolling walker (2 wheeled) Transfers: Sit to/from Stand Sit to Stand: Min guard         General transfer comment: cues for hand placement and RT leg to sit down  Ambulation/Gait Ambulation/Gait assistance: Min guard Gait Distance (Feet): 80 Feet Assistive device: Rolling walker (2 wheeled) Gait Pattern/deviations: Step-to pattern;Decreased stance time - right Gait velocity: decr   General Gait Details: tends to step to and pick up Rw, states he wants to go slow.   Stairs             Wheelchair Mobility    Modified Rankin (Stroke Patients Only)       Balance Overall balance assessment: Needs assistance Sitting-balance support: Feet supported;Bilateral upper extremity supported Sitting balance-Leahy Scale: Good     Standing balance  support: During functional activity;Bilateral upper extremity supported Standing balance-Leahy Scale: Fair                              Cognition Arousal/Alertness: Awake/alert                                            Exercises    General Comments        Pertinent Vitals/Pain Pain Score: 6  Pain Location: right knee Pain Descriptors / Indicators: Aching;Discomfort;Grimacing Pain Intervention(s): Premedicated before session;Ice applied    Home Living                      Prior Function            PT Goals (current goals can now be found in the care plan section) Acute Rehab PT Goals Patient Stated Goal: to go home PT Goal Formulation: With patient/family Time For Goal Achievement: 07/30/19 Potential to Achieve Goals: Good Progress towards PT goals: Progressing toward goals    Frequency    7X/week      PT Plan Current plan remains appropriate    Co-evaluation              AM-PAC PT "6 Clicks" Mobility   Outcome Measure  Help needed turning from your back to your side while in a flat bed  without using bedrails?: A Little Help needed moving from lying on your back to sitting on the side of a flat bed without using bedrails?: A Little Help needed moving to and from a bed to a chair (including a wheelchair)?: A Little Help needed standing up from a chair using your arms (e.g., wheelchair or bedside chair)?: A Little Help needed to walk in hospital room?: A Lot Help needed climbing 3-5 steps with a railing? : A Lot 6 Click Score: 14    End of Session Equipment Utilized During Treatment: Gait belt Activity Tolerance: Patient limited by pain Patient left: in bed;with call bell/phone within reach;with bed alarm set;with family/visitor present Nurse Communication: Mobility status PT Visit Diagnosis: Muscle weakness (generalized) (M62.81);Pain Pain - Right/Left: Right Pain - part of body: Knee     Time:  1347-1430 PT Time Calculation (min) (ACUTE ONLY): 43 min  Charges:  $Gait Training: 23-37 mins $Therapeutic Exercise: 8-22 mins $Self Care/Home Management: [place] Pager [phone] Office [phone]    Claretha Cooper 07/23/2019, 3:30 PM

## 2019-07-23 NOTE — Op Note (Signed)
NAME: [name] [name], [name] [name] MEDICAL RECORD [id number] ACCOUNT [id number] DATE OF BIRTH:1945-05-07 FACILITY: [facility] LOCATION: [facility] PHYSICIAN:STEVEN Orlena Sheldon, MD  OPERATIVE REPORT  DATE OF PROCEDURE:  07/22/2019  PREOPERATIVE DIAGNOSIS:  Right knee end-stage osteoarthritis.  POSTOPERATIVE DIAGNOSIS:  Right knee end-stage osteoarthritis.  PROCEDURE PERFORMED:  Right total knee replacement using DePuy Sigma rotating platform prosthesis.  ATTENDING SURGEON:  Esmond Plants, MD  ASSISTANT:  Darol Destine, [place], who was scrubbed during the entire procedure and necessary for satisfactory completion of surgery.  ANESTHESIA:  Spinal anesthesia was used plus adductor canal blocks.   ESTIMATED BLOOD LOSS:  Minimal.  FLUID REPLACEMENT:  1500 mL crystalloid.  INSTRUMENT COUNTS:  Correct.  COMPLICATIONS:  No complications.  ANTIBIOTICS:  Perioperative antibiotics were given.  TOURNIQUET TIME:  1 hour and 20 minutes at 300 mmHg.  INDICATIONS:  The patient is a 74 year old male with worsening knee pain secondary to end-stage arthritis of the right knee.  The patient suffered a meniscal root tear in addition to full thickness cartilage loss in the medial compartment.  Due to the  patient's progressive pain and poor function, he elected to proceed with knee replacement surgery to restore mechanical alignment and eliminate pain and restore function of the knee.  Informed consent obtained.  DESCRIPTION OF PROCEDURE:  After an adequate level of anesthesia was achieved, the patient was positioned supine on the operating room table.  Right leg was correctly identified and a nonsterile tourniquet placed on proximal thigh.  Right leg sterilely  prepped and draped in the usual manner.  Time-out called, verifying correct patient, correct site.  We elevated the leg and exsanguinated using Esmarch bandage.  We then elevated the tourniquet to 300 mmHg.  We placed the knee in flexion, performed our   longitudinal midline incision with a 10 blade scalpel.  Dissection down through subcutaneous tissues with a scalpel blade.  We identified the peripatellar tissues and performed a median parapatellar arthrotomy with a fresh 10 blade scalpel, entering the  joint, dividing lateral patellofemoral ligaments, and everting the patella.  We entered the distal femur, which showed full thickness cartilage loss, medial femoral condyle.  We entered the distal femur with a step cut drill.  We then used a 10 mm  resection 5 degrees of valgus on the right and resected with the oscillating saw for the distal femoral cut.  Next, we used the posterior referencing with an anterior down guide to size the femur to a size 4 and we cut the anterior, posterior and chamfer  cuts with the 4-in-1 block.  We had no notching.  We were pleased with our resection.  We removed ACL, PCL, meniscal tissue, subluxed the tibia anteriorly and performed our tibial cut with an external alignment guide 90 degrees perpendicular to the long  axis of tibia with minimal posterior slope using an oscillating saw.  We irrigated thoroughly, removed excess posterior osteophytes with a 1 inch curved osteotome and a lamina spreader.  Once we had that resection complete, we checked our gaps, which  were symmetric at 10 mm.  We then completed our tibial preparation with the modular drill and keel punch for the size 5 tibia and then we went to the femur with a box cut for the posterior cruciate substituting prosthesis.  We then impacted the 4 right  femur posterior cruciate substituting and then placed first a 10, then a 12.5 poly trial spacer.  We were able to achieve full extension with a 12.5 and had  good flexion stability.  Thus, we selected that for our real.  We also resurfaced our patella  going from a 25 mm thickness down to 16 mm thickness.  We then drilled our lug holes for the 38 patellar button.  Once we had the trial button in place, we ranged the  knee and had excellent patellar tracking with no-touch technique.  We removed all trial  components.  We irrigated the bone well and dried and then cemented the components into place.  A 4 right femur, 5 tibia with a 10 mm spacer to allow the knee to come in extension and to compress the cement.  We used a patellar clamp to hold the patella  compressed while the cement set up.  Once the cement was fully set up, we removed excess cement with 1/4-inch curved osteotome and rongeur and pituitary.  Once we had the cement fragments out, we did a final inspection of the knee.  We selected the real  4, 12.5 poly and placed that on the tibial tray and reduced the knee.  We had a nice little pop in the medial femoral condyle reducing and were pleased with that soft tissue balancing.  We irrigated thoroughly and then closed the parapatellar arthrotomy  with #1 Vicryl suture followed by 2-0 Vicryl for subcutaneous closure and 4-0 Monocryl for skin.  Steri-Strips applied followed by sterile dressing.  The patient tolerated surgery well.  CN/NUANCE  D:07/22/2019 T:07/23/2019 JOB:[id number]

## 2019-07-23 NOTE — Progress Notes (Signed)
   Subjective: 1 Day Post-Op Procedure(s) (LRB): TOTAL KNEE ARTHROPLASTY (Right)  Recheck right knee s/p total replacement Pt c/o mild to moderate pain overnight Otherwise doing fair Ready for therapy  Patient reports pain as moderate.  Objective:   VITALS:   Vitals:   07/23/19 0131 07/23/19 0454  BP: 137/72 140/72  Pulse: (!) 58 (!) 55  Resp: 15 15  Temp: 98 F (36.7 C) 98 F (36.7 C)  SpO2: 100% 100%    Right knee dressing intact nv intact distally No rashes or edema distally   LABS Recent Labs    07/23/19 0306  HGB 12.2*  HCT 37.2*  WBC 10.5  PLT 176    Recent Labs    07/23/19 0306  NA 139  K 3.8  BUN 15  CREATININE 1.42*  GLUCOSE 145*     Assessment/Plan: 1 Day Post-Op Procedure(s) (LRB): TOTAL KNEE ARTHROPLASTY (Right) PT/OT today Pain management Will determine d/c depending on progress with therapy Pt in and family in agreement     Brad Luna [place] [employer]  [address], [place], [place] [address] Phone: [phone] [email or web address] Facebook  [employer]

## 2019-07-23 NOTE — Plan of Care (Signed)
  Problem: Education: Goal: Knowledge of General Education information will improve Description: Including pain rating scale, medication(s)/side effects and non-pharmacologic comfort measures Outcome: Progressing   Problem: Coping: Goal: Level of anxiety will decrease Outcome: Progressing   Problem: Pain Managment: Goal: General experience of comfort will improve Outcome: Progressing   Problem: Education: Goal: Knowledge of the prescribed therapeutic regimen will improve Outcome: Progressing Goal: Individualized Educational Video(s) Outcome: Progressing

## 2019-07-23 NOTE — Evaluation (Signed)
Physical Therapy Evaluation Patient Details Name: [name] [name]. MRN: [id number] DOB: 09/23/1945 Today's Date: 07/23/2019   History of Present Illness  S/P rightTKA on 07/22/19. H/O HTN,  Clinical Impression  The  Patient mobilized and ambulated x 40', reports increased pain right knee. Patient will benefit from another night for continued PT sessions to ensure safety and progression,. Pt admitted with above diagnosis. Pt currently with functional limitations due to the deficits listed below (see PT Problem List). Pt will benefit from skilled PT to increase their independence and safety with mobility to allow discharge to the venue listed below.       Follow Up Recommendations Follow surgeon's recommendation for DC plan and follow-up therapies(OPPT)    Equipment Recommendations  3in1 (PT)    Recommendations for Other Services       Precautions / Restrictions Precautions Precautions: Knee;Fall Precaution Comments: wears briefs w/pad Required Braces or Orthoses: Knee Immobilizer - Right Knee Immobilizer - Right: On when out of bed or walking Restrictions RLE Weight Bearing: Weight bearing as tolerated      Mobility  Bed Mobility Overal bed mobility: Needs Assistance Bed Mobility: Supine to Sit     Supine to sit: Min assist     General bed mobility comments: support RLE, cues for technique  Transfers Overall transfer level: Needs assistance Equipment used: Rolling walker (2 wheeled) Transfers: Sit to/from Stand Sit to Stand: Min assist         General transfer comment: cues for hand placement and RT leg to sit down  Ambulation/Gait Ambulation/Gait assistance: Min assist Gait Distance (Feet): 40 Feet Assistive device: Rolling walker (2 wheeled) Gait Pattern/deviations: Step-to pattern;Decreased stance time - right Gait velocity: decr   General Gait Details: slow step to gait, decreased Wb tolerated  Stairs            Wheelchair Mobility     Modified Rankin (Stroke Patients Only)       Balance Overall balance assessment: Needs assistance Sitting-balance support: Feet supported;Bilateral upper extremity supported Sitting balance-Leahy Scale: Good     Standing balance support: During functional activity;Bilateral upper extremity supported Standing balance-Leahy Scale: Fair                               Pertinent Vitals/Pain Pain Assessment: Faces Pain Score: 6  Faces Pain Scale: Hurts even more Pain Location: right knee Pain Descriptors / Indicators: Aching;Discomfort;Grimacing Pain Intervention(s): Premedicated before session;Monitored during session;Limited activity within patient's tolerance;Ice applied    Home Living Family/patient expects to be discharged to:: Private residence   Available Help at Discharge: Family;Available 24 hours/day Type of Home: House Home Access: Stairs to enter Entrance Stairs-Rails: Right Entrance Stairs-Number of Steps: 5 Home Layout: One level Home Equipment: Walker - 2 wheels;Bedside commode      Prior Function Level of Independence: Independent               Hand Dominance        Extremity/Trunk Assessment   Upper Extremity Assessment Upper Extremity Assessment: Overall WFL for tasks assessed    Lower Extremity Assessment Lower Extremity Assessment: RLE deficits/detail RLE Deficits / Details: knee flex 5-45, SLR to 4" L   Cervical / Trunk Assessment Cervical / Trunk Assessment: Normal  Communication   Communication: HOH  Cognition Arousal/Alertness: Awake/alert Behavior During Therapy: WFL for tasks assessed/performed Overall Cognitive Status: Within Functional Limits for tasks assessed  General Comments      Exercises Total Joint Exercises Ankle Circles/Pumps: AROM;Supine;10 reps Quad Sets: AROM;Supine;10 reps Heel Slides: AAROM;Right;10 reps Hip ABduction/ADduction:  AAROM;Right;10 reps;Supine Straight Leg Raises: AAROM;Right;10 reps;Supine Knee Flexion: AAROM;Right;10 reps;Supine   Assessment/Plan    PT Assessment Patient needs continued PT services  PT Problem List Decreased strength;Decreased mobility;Decreased range of motion;Decreased knowledge of precautions;Decreased activity tolerance;Pain;Decreased knowledge of use of DME       PT Treatment Interventions DME instruction;Therapeutic activities;Gait training;Therapeutic exercise;Patient/family education;Stair training;Functional mobility training    PT Goals (Current goals can be found in the Care Plan section)  Acute Rehab PT Goals Patient Stated Goal: to go home PT Goal Formulation: With patient/family Time For Goal Achievement: 07/30/19 Potential to Achieve Goals: Good    Frequency 7X/week   Barriers to discharge        Co-evaluation               AM-PAC PT "6 Clicks" Mobility  Outcome Measure Help needed turning from your back to your side while in a flat bed without using bedrails?: A Little Help needed moving from lying on your back to sitting on the side of a flat bed without using bedrails?: A Little Help needed moving to and from a bed to a chair (including a wheelchair)?: A Little Help needed standing up from a chair using your arms (e.g., wheelchair or bedside chair)?: A Little Help needed to walk in hospital room?: A Lot Help needed climbing 3-5 steps with a railing? : A Lot 6 Click Score: 16    End of Session Equipment Utilized During Treatment: Gait belt Activity Tolerance: Patient limited by pain Patient left: in chair;with call bell/phone within reach;with chair alarm set;with family/visitor present Nurse Communication: Mobility status PT Visit Diagnosis: Muscle weakness (generalized) (M62.81);Pain Pain - Right/Left: Right Pain - part of body: Knee    Time: [id number] PT Time Calculation (min) (ACUTE ONLY): 47 min   Charges:   PT Evaluation $PT Eval  Low Complexity: 1 Low PT Treatments $Gait Training: 8-22 mins $Therapeutic Exercise: 8-22 mins        Tresa Endo PT Acute Rehabilitation Services Pager [phone] Office [phone]   Claretha Cooper 07/23/2019, 12:58 PM

## 2019-07-23 NOTE — Progress Notes (Signed)
Orthopedic Tech Progress Note Patient Details:  [name] [name] Oct 06, 1945 [id number]  Patient ID: [name] Howells., male   DOB: Sep 06, 1945, 74 y.o.   MRN: [id number]   [name] [name] 07/23/2019, 9:02 PM cpm removed 2102

## 2019-07-24 ENCOUNTER — Telehealth: Payer: Self-pay

## 2019-07-24 LAB — CBC
HCT: 35.8 % — ABNORMAL LOW (ref 39.0–52.0)
Hemoglobin: 11.6 g/dL — ABNORMAL LOW (ref 13.0–17.0)
MCH: 30.1 pg (ref 26.0–34.0)
MCHC: 32.4 g/dL (ref 30.0–36.0)
MCV: 92.7 fL (ref 80.0–100.0)
Platelets: 157 10*3/uL (ref 150–400)
RBC: 3.86 MIL/uL — ABNORMAL LOW (ref 4.22–5.81)
RDW: 13.2 % (ref 11.5–15.5)
WBC: 11.9 10*3/uL — ABNORMAL HIGH (ref 4.0–10.5)
nRBC: 0 % (ref 0.0–0.2)

## 2019-07-24 NOTE — Progress Notes (Signed)
Met briefly with pt to review/ confirm he has received DME (rw and 3n1 commode) via [facility] and scheduled for OPPT at [facility] in [place].  No further needs per pt.  Jasamine Pottinger, LCSW

## 2019-07-24 NOTE — Telephone Encounter (Signed)
   [facility] Pre-operative Risk Assessment    HEARTCARE STAFF: - Please ensure there is not already an duplicate clearance open for this procedure - Under Visit Info/Reason for Call, type in Other and utilize the format Clearance MM/DD/YY or Clearance TBD  Request for surgical clearance:  1. What type of surgery is being performed? Right total knee arthoplasty  2. When is this surgery scheduled? TBD   3. What type of clearance is required (medical clearance vs. Pharmacy clearance to hold med vs. Both)? medical  4. Are there any medications that need to be held prior to surgery and how long?none listed   5. Practice name and name of physician performing surgery? [facility]-- [name]   6. What is the office phone number? [phone]   7.   What is the office fax number? [phone]  8.   Anesthesia type (None, local, MAC, general) ? Spinal   Wilma Flavin 07/24/2019, 5:03 PM  _________________________________________________________________   (provider comments below)

## 2019-07-24 NOTE — Progress Notes (Signed)
Orthopedics Progress Note  Subjective: Right knee feeling better today  Objective:  Vitals:   07/24/19 0524 07/24/19 1229  BP: 114/66 119/71  Pulse: 64 66  Resp: 16 19  Temp: 99 F (37.2 C) 99.5 F (37.5 C)  SpO2: 100% 100%    General: Awake and alert  Musculoskeletal: Dressing intact, NVI, Neg Homans Neurovascularly intact  Lab Results  Component Value Date   WBC 11.9 (H) 07/24/2019   HGB 11.6 (L) 07/24/2019   HCT 35.8 (L) 07/24/2019   MCV 92.7 07/24/2019   PLT 157 07/24/2019       Component Value Date/Time   NA 139 07/23/2019 0306   NA 145 (H) 06/22/2018 0839   K 3.8 07/23/2019 0306   CL 110 07/23/2019 0306   CO2 20 (L) 07/23/2019 0306   GLUCOSE 145 (H) 07/23/2019 0306   BUN 15 07/23/2019 0306   BUN 18 06/22/2018 0839   CREATININE 1.42 (H) 07/23/2019 0306   CALCIUM 8.4 (L) 07/23/2019 0306   GFRNONAA 49 (L) 07/23/2019 0306   GFRAA 57 (L) 07/23/2019 0306    No results found for: INR, PROTIME  Assessment/Plan: POD #2 s/p Procedure(s): TOTAL KNEE ARTHROPLASTY Discharge to home with outpatient therapy Follow up in two weeks  Doran Heater. [name], MD 07/24/2019 12:40 PM

## 2019-07-24 NOTE — Progress Notes (Signed)
Patient discharged to home w/ family. Given all belongings, instructions, equipment. Verbalized understanding of instructions. Escorted to pov via w/c. 

## 2019-07-24 NOTE — Discharge Summary (Signed)
Orthopedic Discharge Summary        Physician Discharge Summary  Patient ID: [name] [name]. MRN: [id number] DOB/AGE: [birth year] 74 y.o.  Admit date: 07/22/2019 Discharge date: 07/24/2019   Procedures:  Procedure(s) (LRB): TOTAL KNEE ARTHROPLASTY (Right)  Attending Physician:  Dr. Esmond Plants  Admission Diagnoses:   Right knee OA, end stage  Discharge Diagnoses:  same   Past Medical History:  Diagnosis Date  . ABNORMAL EKG 10/07/2008  . ADVEF, DRUG/MED/BIOL SUBST, OTHER DRUG NOS 09/26/2006  . Agent orange exposure   . Allergy    seasonal  . Cataract    left eye-surgery 07-2015  . DEGENERATIVE JOINT DISEASE, MODERATE 04/13/2007  . DIVERTICULOSIS, COLON 04/13/2007  . ELEVATED PROSTATE SPECIFIC ANTIGEN 04/14/2007  . GERD (gastroesophageal reflux disease)   . Glaucoma    sees [name]   . History of kidney stones    1970   . HYPERLIPIDEMIA 09/26/2006  . HYPERTENSION 04/13/2007  . JOINT EFFUSION, KNEE 06/15/2009  . Melanoma [facility])    sees Dr. Allyn [name]   . Numbness and tingling   . PROSTATE CANCER 10/05/2009   sees [name]  . Renal insufficiency    stage III [name]     PCP: [name], MD   Discharged Condition: good  Hospital Course:  Patient underwent the above stated procedure on 07/22/2019. Patient tolerated the procedure well and brought to the recovery room in good condition and subsequently to the floor. Patient had an uncomplicated hospital course and was stable for discharge.   Disposition: Discharge disposition: 01-Home or Self Care      with follow up in 2 weeks   Follow-up Information    Netta Cedars, MD .   Specialty: Orthopedic Surgery Contact information: [address] [place] [address] [id number]        Netta Cedars, MD. Call in 2 weeks.   Specialty: Orthopedic Surgery Why: [phone] Contact information: [address] [place] [address] [id number]           Discharge  Instructions    Call MD / Call 911   Complete by: As directed    If you experience chest pain or shortness of breath, CALL 911 and be transported to the hospital emergency room.  If you develope a fever above 101 F, pus (white drainage) or increased drainage or redness at the wound, or calf pain, call your surgeon's office.   Constipation Prevention   Complete by: As directed    Drink plenty of fluids.  Prune juice may be helpful.  You may use a stool softener, such as Colace (over the counter) 100 mg twice a day.  Use MiraLax (over the counter) for constipation as needed.   Diet - low sodium heart healthy   Complete by: As directed    Increase activity slowly as tolerated   Complete by: As directed       Allergies as of 07/24/2019   No Known Allergies     Medication List    TAKE these medications   aspirin EC 81 MG tablet Take 1 tablet (81 mg total) by mouth in the morning and at bedtime. What changed: when to take this   bimatoprost 0.01 % Soln Commonly known as: LUMIGAN Place 1 drop into both eyes at bedtime.   clopidogrel 75 MG tablet Commonly known as: PLAVIX Take 1 tablet (75 mg total) by mouth daily with breakfast. What changed: when to take this  CoQ-10 100 MG Caps Take 100 mg by mouth at bedtime.   dorzolamide-timolol 22.3-6.8 MG/ML ophthalmic solution Commonly known as: COSOPT Place 1 drop into both eyes 2 (two) times daily.   famotidine 20 MG tablet Commonly known as: PEPCID Take 20 mg by mouth at bedtime.   fenofibrate 160 MG tablet Take 160 mg by mouth at bedtime.   isosorbide mononitrate 30 MG 24 hr tablet Commonly known as: IMDUR Take 1 tablet (30 mg total) by mouth daily.   magnesium oxide 400 MG tablet Commonly known as: MAG-OX Take 400 mg by mouth every evening.   methocarbamol 500 MG tablet Commonly known as: Robaxin Take 1 tablet (500 mg total) by mouth every 8 (eight) hours as needed.   multivitamin with minerals tablet Take 1 tablet  by mouth 2 (two) times a week. Sundays and Wednesdays   nitroGLYCERIN 0.4 MG SL tablet Commonly known as: NITROSTAT Place 0.4 mg under the tongue every 5 (five) minutes as needed for chest pain.   oxyCODONE-acetaminophen 5-325 MG tablet Commonly known as: Percocet Take 1-2 tablets by mouth every 4 (four) hours as needed for severe pain.   pantoprazole 40 MG tablet Commonly known as: PROTONIX Take 1 tablet (40 mg total) by mouth daily.   rosuvastatin 20 MG tablet Commonly known as: CRESTOR Take 20 mg by mouth every evening.   telmisartan 80 MG tablet Commonly known as: Micardis Take 1 tablet (80 mg total) by mouth daily.         Signed: [name] 07/24/2019, 12:42 PM  [name] now [employer] [facility] [address] [place], [place], [place] [address] Phone: [place]

## 2019-07-24 NOTE — Progress Notes (Signed)
Physical Therapy Treatment Patient Details Name: [name] [name]. MRN: [id number] DOB: 1945/10/11 Today's Date: 07/24/2019    History of Present Illness S/P rightTKA on 07/22/19. H/O HTN,    PT Comments    Patient performed stairs and HEP. Quads remain weak with SLR with great effort so instructed pt. And wife to maintain KI for up/down steps until OPPT clears. Ready for DC.  Follow Up Recommendations  Follow surgeon's recommendation for DC plan and follow-up therapies;Outpatient PT     Equipment Recommendations  3in1 (PT)    Recommendations for Other Services       Precautions / Restrictions Restrictions Weight Bearing Restrictions: No RLE Weight Bearing: Weight bearing as tolerated    Mobility  Bed Mobility               General bed mobility comments: used  belt to lift right leg onto bed, no assistance to move to sitting  Transfers Overall transfer level: Needs assistance Equipment used: Rolling walker (2 wheeled) Transfers: Sit to/from Stand Sit to Stand: Supervision         General transfer comment: cues for hand placement and RT leg to sit down  Ambulation/Gait Ambulation/Gait assistance: Supervision Gait Distance (Feet): 80 Feet Assistive device: Rolling walker (2 wheeled) Gait Pattern/deviations: Step-through pattern;Decreased stance time - right;Step-to pattern     General Gait Details: smoothe gait, able to roll and step through   Stairs Stairs: Yes Stairs assistance: Min assist Stair Management: One rail Right;Step to pattern;Forwards;With cane Number of Stairs: 5 General stair comments: wife present to assist. Reinforced need for KI until OPPT clears withput   Wheelchair Mobility    Modified Rankin (Stroke Patients Only)       Balance                                            Cognition Arousal/Alertness: Awake/alert                                            Exercises Total Joint  Exercises Ankle Circles/Pumps: AROM;Supine;10 reps Quad Sets: AROM;Supine;10 reps Heel Slides: AAROM;Right;10 reps Hip ABduction/ADduction: AAROM;Right;10 reps;Supine Straight Leg Raises: AAROM;Right;10 reps;Supine    General Comments        Pertinent Vitals/Pain Faces Pain Scale: Hurts little more Pain Location: right knee Pain Descriptors / Indicators: Aching;Discomfort;Grimacing Pain Intervention(s): Premedicated before session    Home Living                      Prior Function            PT Goals (current goals can now be found in the care plan section)      Frequency    7X/week      PT Plan Current plan remains appropriate    Co-evaluation              AM-PAC PT "6 Clicks" Mobility   Outcome Measure  Help needed turning from your back to your side while in a flat bed without using bedrails?: None Help needed moving from lying on your back to sitting on the side of a flat bed without using bedrails?: None Help needed moving to and from a bed to a chair (including a wheelchair)?: A  Little Help needed standing up from a chair using your arms (e.g., wheelchair or bedside chair)?: A Little Help needed to walk in hospital room?: A Little Help needed climbing 3-5 steps with a railing? : A Little 6 Click Score: 20    End of Session Equipment Utilized During Treatment: Gait belt Activity Tolerance: Patient tolerated treatment well Patient left: in bed;with family/visitor present Nurse Communication: Mobility status PT Visit Diagnosis: Muscle weakness (generalized) (M62.81);Pain Pain - Right/Left: Right Pain - part of body: Knee     Time: 1020-1059 PT Time Calculation (min) (ACUTE ONLY): 39 min  Charges:  $Gait Training: 8-22 mins $Therapeutic Exercise: 8-22 mins $Self Care/Home Management: [place] Pager [phone] Office [phone]    Claretha Cooper 07/24/2019,  12:59 PM

## 2019-07-24 NOTE — Plan of Care (Signed)
Plan of care reviewed and discussed with the patient. 

## 2019-07-25 ENCOUNTER — Other Ambulatory Visit: Payer: Self-pay

## 2019-07-25 ENCOUNTER — Ambulatory Visit: Payer: Medicare Other | Attending: Orthopedic Surgery | Admitting: Physical Therapy

## 2019-07-25 ENCOUNTER — Encounter: Payer: Self-pay | Admitting: Physical Therapy

## 2019-07-25 DIAGNOSIS — R600 Localized edema: Secondary | ICD-10-CM | POA: Diagnosis not present

## 2019-07-25 DIAGNOSIS — M25561 Pain in right knee: Secondary | ICD-10-CM

## 2019-07-25 DIAGNOSIS — M25661 Stiffness of right knee, not elsewhere classified: Secondary | ICD-10-CM | POA: Diagnosis not present

## 2019-07-25 DIAGNOSIS — R262 Difficulty in walking, not elsewhere classified: Secondary | ICD-10-CM | POA: Diagnosis not present

## 2019-07-25 NOTE — Patient Instructions (Signed)
Access Code: [email or web address] URL: [email or web address] Date: 07/25/2019 Prepared by: Kearney Hard  Exercises Long Sitting Quad Set with Towel Roll Under Heel - 3 x daily - 7 x weekly - 3 sets - 10 reps - 5 seconds hold Supine Straight Leg Raises - 3 x daily - 7 x weekly - 3 sets - 5 reps Seated Knee Flexion Extension AROM - 3 x daily - 7 x weekly - 3 sets - 10 reps Supine Heel Slide - 3 x daily - 7 x weekly - 3 sets - 10 reps - 5 seconds hold

## 2019-07-25 NOTE — Therapy (Signed)
[facility] [place], [place], [address] Phone: [phone]   Fax:  [phone]  Physical Therapy Evaluation  Patient Details  Name: [name] [name]. MRN: [id number] Date of Birth: Feb 27, 1946 Referring Provider (PT): Esmond Plants, MD   Encounter Date: 07/25/2019  PT End of Session - 07/25/19 1717    Visit Number  1    Number of Visits  24    Date for PT Re-Evaluation  09/19/19    Authorization Type  KX modifier after visit 15, progress note every 10th visit    Progress Note Due on Visit  10    PT Start Time  1515    PT Stop Time  1600    PT Time Calculation (min)  45 min    Activity Tolerance  Patient tolerated treatment well    Behavior During Therapy  [facility] for tasks assessed/performed       Past Medical History:  Diagnosis Date  . ABNORMAL EKG 10/07/2008  . ADVEF, DRUG/MED/BIOL SUBST, OTHER DRUG NOS 09/26/2006  . Agent orange exposure   . Allergy    seasonal  . Cataract    left eye-surgery 07-2015  . DEGENERATIVE JOINT DISEASE, MODERATE 04/13/2007  . DIVERTICULOSIS, COLON 04/13/2007  . ELEVATED PROSTATE SPECIFIC ANTIGEN 04/14/2007  . GERD (gastroesophageal reflux disease)   . Glaucoma    sees [name]   . History of kidney stones    1970   . HYPERLIPIDEMIA 09/26/2006  . HYPERTENSION 04/13/2007  . JOINT EFFUSION, KNEE 06/15/2009  . Melanoma [facility])    sees [name]   . Numbness and tingling   . PROSTATE CANCER 10/05/2009   sees [name]  . Renal insufficiency    stage III Short Kidney     Past Surgical History:  Procedure Laterality Date  . ACROMIOPLASTY Right 05-06-14   per [name]   . BUNIONECTOMY     bilateral  . CATARACT EXTRACTION Left    per [name]   . COLONOSCOPY  07/10/2015   per [name], benign polyp, repeat in 10 yrs   . CORONARY STENT INTERVENTION N/A 01/17/2018   Procedure: CORONARY STENT INTERVENTION;  Surgeon: [name], MD;  Location: [place] [facility];  Service:  Cardiovascular;  Laterality: N/A;  . CYSTECTOMY     benign from hand  . GLAUCOMA SURGERY Bilateral 2014   . heart stent    . INSERTION OF MESH N/A 01/10/2013   Procedure: INSERTION OF MESH;  Surgeon: Earnstine Regal, MD;  Location: [place];  Service: General;  Laterality: N/A;  . KNEE ARTHROSCOPY Right Jan. 2013   right knee, per [name]   . KNEE ARTHROSCOPY Left 01-14-15   per [name]   . LEFT HEART CATH AND CORONARY ANGIOGRAPHY N/A 01/17/2018   Procedure: LEFT HEART CATH AND CORONARY ANGIOGRAPHY;  Surgeon: [name], MD;  Location: [place] [facility];  Service: Cardiovascular;  Laterality: N/A;  . LEFT HEART CATH AND CORONARY ANGIOGRAPHY N/A 04/06/2018   Procedure: LEFT HEART CATH AND CORONARY ANGIOGRAPHY;  Surgeon: [name], MD;  Location: [place] [facility];  Service: Cardiovascular;  Laterality: N/A;  . MELANOMA EXCISION  2012   per [name]  . PROSTATECTOMY     per [name]  . TONSILLECTOMY    . TOTAL KNEE ARTHROPLASTY Right 07/22/2019   Procedure: TOTAL KNEE ARTHROPLASTY;  Surgeon: Netta Cedars, MD;  Location: [facility];  Service: Orthopedics;  Laterality:  Right;  . UMBILICAL HERNIA REPAIR  11/01/2011   Procedure: HERNIA REPAIR UMBILICAL ADULT;  Surgeon: Earnstine Regal, MD;  Location: [place];  Service: General;  Laterality: N/A;  Repair umbilical hernia with mesh patch  . VASECTOMY    . VENTRAL HERNIA REPAIR  01/10/2013   with mesh     [name]  . VENTRAL HERNIA REPAIR N/A 01/10/2013   Procedure: HERNIA REPAIR VENTRAL ADULT ;  Surgeon: Earnstine Regal, MD;  Location: [place];  Service: General;  Laterality: N/A;    There were no vitals filed for this visit.   Subjective Assessment - 07/25/19 1513    Subjective  Pt arriving to therapy with his wife reporting 5/10 pain. Pt reporting he took pain pill at 1330 because he knew he was coming to therpay. Pt states he usually only takes pain pill at night before bed.    Patient is accompained by:  Family  member   wife   Pertinent History  2009 prostate CA, kidney disease, cardiac stent placment    Limitations  Standing;House hold activities;Walking;Writing    Currently in Pain?  Yes    Pain Score  5     Pain Location  Knee    Pain Orientation  Right    Pain Descriptors / Indicators  Aching;Sore    Pain Type  Surgical pain;Acute pain    Pain Onset  In the past 7 days    Pain Frequency  Once a week    Aggravating Factors   bending, walking    Pain Relieving Factors  resting, pain meds mucle relaxor    Effect of Pain on Daily Activities  difficulty with ADL's and walking         [facility] PT Assessment - 07/25/19 0001      Assessment   Medical Diagnosis  R TKA on 07/22/2019    Referring Provider (PT)  Esmond Plants, MD    Onset Date/Surgical Date  07/22/19    Hand Dominance  Left    Prior Therapy  yes for rotator cuff      Precautions   Precautions  None      Restrictions   Weight Bearing Restrictions  No      Balance Screen   Has the patient fallen in the past 6 months  No    Is the patient reluctant to leave their home because of a fear of falling?   No      Home Environment   Living Environment  Private residence    Type of [place] to enter    Entrance Stairs-Number of Steps  5    Entrance Stairs-Rails  Right      Prior Function   Level of Independence  Independent    Vocation  Retired    Leisure  fish, hike, Environmental consultant, garden      Cognition   Overall Cognitive Status  Within Functional Limits for tasks assessed      Observation/Other Assessments   Focus on Therapeutic Outcomes (FOTO)   perform at next visit      Observation/Other Assessments-Edema    Edema  Circumferential      Circumferential Edema   Circumferential - Right  R knee is 3 centimeters greater than left      Posture/Postural Control   Posture/Postural Control  Postural limitations    Postural Limitations  Rounded Shoulders;Forward head      ROM /  Strength    AROM / PROM / Strength  AROM;PROM;Strength      AROM   AROM Assessment Site  Knee    Right/Left Knee  Right;Left    Right Knee Extension  15    Right Knee Flexion  70    Left Knee Extension  0    Left Knee Flexion  125      PROM   PROM Assessment Site  Knee    Right/Left Knee  Right;Left    Right Knee Extension  10    Right Knee Flexion  90      Strength   Strength Assessment Site  Knee    Right/Left Knee  Right;Left    Right Knee Flexion  3/5    Right Knee Extension  2/5    Left Knee Flexion  5/5    Left Knee Extension  5/5      Palpation   Palpation comment  tenderness along join line, increased edema, bandage intact      Transfers   Five time sit to stand comments   24 seconds using UE support      Ambulation/Gait   Assistive device  Rolling walker    Gait Pattern  Step-through pattern;Decreased step length - right;Decreased stance time - right;Decreased hip/knee flexion - right;Poor foot clearance - right                  Objective measurements completed on examination: See above findings.      [place] Adult PT Treatment/Exercise - 07/25/19 0001      Modalities   Modalities  Teacher, English as a foreign language Location  R quad    Electrical Stimulation Action  russian 10/10 cycle, intensity to tolreance while performing quad sets x 8 minutes    Electrical Stimulation Goals  Neuromuscular facilitation      Manual Therapy   Manual Therapy  Passive ROM    Passive ROM  knee flexion and extension with over pressure             PT Education - 07/25/19 1717    Education Details  HEP, PT POC    Person(s) Educated  Patient    Methods  Explanation;Demonstration;Other (comment)    Comprehension  Verbalized understanding;Returned demonstration       PT Short Term Goals - 07/25/19 1735      PT SHORT TERM GOAL #1   Title  Pt will be albe to perform 15 SLR on R LE and amb without RW.    Time  3    Period   Weeks    Status  New    Target Date  08/15/19        PT Long Term Goals - 07/25/19 1736      PT LONG TERM GOAL #1   Title  Ind with advanced HEP.    Time  8    Period  Weeks    Status  New      PT LONG TERM GOAL #2   Title  Pt will be able to amb with no device with step through gait pattern on community level surfaces.    Time  8    Period  Weeks    Status  New      PT LONG TERM GOAL #3   Title  Pt will improve his R knee flexion to 125 degrees in order to be able to improve functional mobility and gait.  Baseline  AROM: 15-70 degrees    Time  8    Period  Weeks    Status  New      PT LONG TERM GOAL #4   Title  Pt will be able to reach full knee extension of 0 degrees in R LE to imrpove gait pattern.    Baseline  15 degrees    Time  8    Period  Weeks    Status  New      PT LONG TERM GOAL #5   Title  Pt will be able to climb a flight of stairs with no rail with no pain reported.    Time  8    Period  Weeks    Status  New             Plan - 07/25/19 1729    Clinical Impression Statement  Pt presenting s/p R TKA on 07/22/2019. Pt is 3 days post op with AROM 15-70 degrees. Pt amb with a rolling walker with decreasd stance time and foot clearance on R with decreased R knee flexion and heel to toe gait. Pt's wife present during evaluation. Pt with PROM 10-90 degrees. Pt unable to perform SLR. [place] E-stim was performed to elicit quad constraction on R. Pt tolreating well. Pt with no reports of increased pain during session however pt reported less stiffness after PROM performed. Skilled PT needed to address pt's impairments with the below interventions.    Personal Factors and Comorbidities  Comorbidity 3+    Comorbidities  see PMH    Examination-Activity Limitations  Squat;Stairs;Stand;Transfers    Examination-Participation Restrictions  Driving;Community Activity;Yard Work;Other    Stability/Clinical Decision Making  Stable/Uncomplicated    Clinical Decision  Making  Low    Rehab Potential  Excellent    PT Frequency  3x / week   3x/ week progressing to 2x/week   PT Duration  8 weeks    PT Treatment/Interventions  ADLs/Self Care Home Management;Cryotherapy;Electrical Stimulation;Functional mobility training;Stair training;Gait training;Therapeutic activities;Therapeutic exercise;Balance training;Neuromuscular re-education;Patient/family education;Manual techniques;Passive range of motion;Taping;Vasopneumatic Device    PT Next Visit Plan  Bike, LE strengthening, hamstring stretches, [place] E-stim and Vaso, pt can discontinue RW when able to perform 15 SLR with no extensor lag    Consulted and Agree with Plan of Care  Patient;Family member/caregiver    Family Member Consulted  wife       Patient will benefit from skilled therapeutic intervention in order to improve the following deficits and impairments:  Pain, Decreased activity tolerance, Impaired flexibility, Decreased strength, Decreased range of motion, Increased edema, Difficulty walking, Abnormal gait  Visit Diagnosis: Acute pain of right knee  Stiffness of right knee, not elsewhere classified  Difficulty in walking, not elsewhere classified  Localized edema     Problem List Patient Active Problem List   Diagnosis Date Noted  . Status post total knee replacement, right 07/22/2019  . Chronic pain of right knee 05/29/2019  . Numbness and tingling 05/29/2019  . CKD (chronic kidney disease) stage 3, GFR 30-59 ml/min 05/20/2019  . Paresthesia 01/17/2019  . Upper airway cough syndrome 09/19/2018  . DOE (dyspnea on exertion) 08/08/2018  . Abnormal cardiac CT angiography 01/17/2018  . Angina, class II ([place]) 01/17/2018  . Incisional hernia 08/11/2011  . PROSTATE CANCER 10/05/2009  . Coronary atherosclerosis 10/05/2009  . ABNORMAL EKG 10/07/2008  . Essential hypertension 04/13/2007  . DIVERTICULOSIS, COLON 04/13/2007  . Osteoarthritis 04/13/2007  . Hyperlipidemia with target LDL  less than  70 09/26/2006    Oretha Caprice, PT, MPT 07/25/2019, 5:42 PM  [facility] [address] [place], [place], [address] Phone: [phone]   Fax:  [phone]  Name: Deighton Benda. MRN: [id number] Date of Birth: April 02, 1945

## 2019-07-29 ENCOUNTER — Ambulatory Visit: Payer: Medicare Other | Admitting: Physical Therapy

## 2019-07-29 ENCOUNTER — Other Ambulatory Visit: Payer: Self-pay

## 2019-07-29 ENCOUNTER — Encounter: Payer: Self-pay | Admitting: Physical Therapy

## 2019-07-29 DIAGNOSIS — M25561 Pain in right knee: Secondary | ICD-10-CM

## 2019-07-29 DIAGNOSIS — R600 Localized edema: Secondary | ICD-10-CM

## 2019-07-29 DIAGNOSIS — R262 Difficulty in walking, not elsewhere classified: Secondary | ICD-10-CM | POA: Diagnosis not present

## 2019-07-29 DIAGNOSIS — M25661 Stiffness of right knee, not elsewhere classified: Secondary | ICD-10-CM | POA: Diagnosis not present

## 2019-07-29 NOTE — Therapy (Signed)
[facility] [place], [place], [address] Phone: [phone]   Fax:  [phone]  Physical Therapy Treatment  Patient Details  Name: [name] [name]. MRN: [id number] Date of Birth: 05-19-1945 Referring Provider (PT): Esmond Plants, MD   Encounter Date: 07/29/2019  PT End of Session - 07/29/19 1437    Visit Number  2    Number of Visits  24    Date for PT Re-Evaluation  09/19/19    Authorization Type  KX modifier after visit 15, progress note every 10th visit    PT Start Time  1432    PT Stop Time  1518    PT Time Calculation (min)  46 min    Activity Tolerance  Patient tolerated treatment well    Behavior During Therapy  [facility] for tasks assessed/performed       Past Medical History:  Diagnosis Date  . ABNORMAL EKG 10/07/2008  . ADVEF, DRUG/MED/BIOL SUBST, OTHER DRUG NOS 09/26/2006  . Agent orange exposure   . Allergy    seasonal  . Cataract    left eye-surgery 07-2015  . DEGENERATIVE JOINT DISEASE, MODERATE 04/13/2007  . DIVERTICULOSIS, COLON 04/13/2007  . ELEVATED PROSTATE SPECIFIC ANTIGEN 04/14/2007  . GERD (gastroesophageal reflux disease)   . Glaucoma    sees [name]   . History of kidney stones    1970   . HYPERLIPIDEMIA 09/26/2006  . HYPERTENSION 04/13/2007  . JOINT EFFUSION, KNEE 06/15/2009  . Melanoma [facility])    sees [name]   . Numbness and tingling   . PROSTATE CANCER 10/05/2009   sees [name]  . Renal insufficiency    stage III [place] [facility]     Past Surgical History:  Procedure Laterality Date  . ACROMIOPLASTY Right 05-06-14   per [name]   . BUNIONECTOMY     bilateral  . CATARACT EXTRACTION Left    per [name]   . COLONOSCOPY  07/10/2015   per [name], benign polyp, repeat in 10 yrs   . CORONARY STENT INTERVENTION N/A 01/17/2018   Procedure: CORONARY STENT INTERVENTION;  Surgeon: [name], MD;  Location: [place] [facility];  Service: Cardiovascular;  Laterality: N/A;  .  CYSTECTOMY     benign from hand  . GLAUCOMA SURGERY Bilateral 2014   . heart stent    . INSERTION OF MESH N/A 01/10/2013   Procedure: INSERTION OF MESH;  Surgeon: Earnstine Regal, MD;  Location: [place];  Service: General;  Laterality: N/A;  . KNEE ARTHROSCOPY Right Jan. 2013   right knee, per [name]   . KNEE ARTHROSCOPY Left 01-14-15   per [name]   . LEFT HEART CATH AND CORONARY ANGIOGRAPHY N/A 01/17/2018   Procedure: LEFT HEART CATH AND CORONARY ANGIOGRAPHY;  Surgeon: [name], MD;  Location: [place] [facility];  Service: Cardiovascular;  Laterality: N/A;  . LEFT HEART CATH AND CORONARY ANGIOGRAPHY N/A 04/06/2018   Procedure: LEFT HEART CATH AND CORONARY ANGIOGRAPHY;  Surgeon: [name], MD;  Location: [place] [facility];  Service: Cardiovascular;  Laterality: N/A;  . MELANOMA EXCISION  2012   per [name]  . PROSTATECTOMY     per [name]  . TONSILLECTOMY    . TOTAL KNEE ARTHROPLASTY Right 07/22/2019   Procedure: TOTAL KNEE ARTHROPLASTY;  Surgeon: Netta Cedars, MD;  Location: [facility];  Service: Orthopedics;  Laterality: Right;  . UMBILICAL HERNIA REPAIR  11/01/2011  Procedure: HERNIA REPAIR UMBILICAL ADULT;  Surgeon: Earnstine Regal, MD;  Location: [place];  Service: General;  Laterality: N/A;  Repair umbilical hernia with mesh patch  . VASECTOMY    . VENTRAL HERNIA REPAIR  01/10/2013   with mesh     [name]  . VENTRAL HERNIA REPAIR N/A 01/10/2013   Procedure: HERNIA REPAIR VENTRAL ADULT ;  Surgeon: Earnstine Regal, MD;  Location: [place];  Service: General;  Laterality: N/A;    There were no vitals filed for this visit.  Subjective Assessment - 07/29/19 1434    Subjective  COVID screening performed on patient upon arrival. Patient reports he is compliant with HEP and took pain medication around 12 pm today.    Pertinent History  2009 prostate CA, kidney disease, cardiac stent placment    Limitations  Standing;House hold activities;Walking;Writing     Currently in Pain?  Yes    Pain Score  4     Pain Location  Knee    Pain Orientation  Right    Pain Descriptors / Indicators  Discomfort    Pain Type  Surgical pain    Pain Onset  1 to 4 weeks ago    Pain Frequency  Intermittent         [facility] PT Assessment - 07/29/19 0001      Assessment   Medical Diagnosis  R TKA     Referring Provider (PT)  Esmond Plants, MD    Onset Date/Surgical Date  07/22/19    Hand Dominance  Left    Next MD Visit  08/01/2019    Prior Therapy  yes for rotator cuff      Precautions   Precautions  None      Restrictions   Weight Bearing Restrictions  No      ROM / Strength   AROM / PROM / Strength  AROM      AROM   Overall AROM   Deficits    AROM Assessment Site  Knee    Right/Left Knee  Right    Right Knee Extension  4    Right Knee Flexion  108                    [facility] Adult PT Treatment/Exercise - 07/29/19 0001      Exercises   Exercises  Knee/Hip      Knee/Hip Exercises: Aerobic   Nustep  L3, seat 9-7 x15 min for ROM      Knee/Hip Exercises: Standing   Forward Lunges  Right;15 reps;2 seconds    Rocker Board  2 minutes      Knee/Hip Exercises: Supine   Quad Sets  AROM;Right;10 reps    [employer] Limitations  5 sec holds    Short Arc [employer]  AROM;Right;20 reps    Short Arc Quad Sets Limitations  5 sec holds      Modalities   Modalities  Vasopneumatic      Vasopneumatic   Number Minutes Vasopneumatic   10 minutes    Vasopnuematic Location   Knee    Vasopneumatic Pressure  Medium    Vasopneumatic Temperature   34      Manual Therapy   Manual Therapy  Passive ROM    Passive ROM  PROM of R knee into flexion with holds at end range               PT Short Term Goals - 07/25/19 1735  PT SHORT TERM GOAL #1   Title  Pt will be albe to perform 15 SLR on R LE and amb without RW.    Time  3    Period  Weeks    Status  New    Target Date  08/15/19        PT Long Term Goals - 07/25/19 1736       PT LONG TERM GOAL #1   Title  Ind with advanced HEP.    Time  8    Period  Weeks    Status  New      PT LONG TERM GOAL #2   Title  Pt will be able to amb with no device with step through gait pattern on community level surfaces.    Time  8    Period  Weeks    Status  New      PT LONG TERM GOAL #3   Title  Pt will improve his R knee flexion to 125 degrees in order to be able to improve functional mobility and gait.    Baseline  AROM: 15-70 degrees    Time  8    Period  Weeks    Status  New      PT LONG TERM GOAL #4   Title  Pt will be able to reach full knee extension of 0 degrees in R LE to imrpove gait pattern.    Baseline  15 degrees    Time  8    Period  Weeks    Status  New      PT LONG TERM GOAL #5   Title  Pt will be able to climb a flight of stairs with no rail with no pain reported.    Time  8    Period  Weeks    Status  New            Plan - 07/29/19 1512    Clinical Impression Statement  Patient presented in clinic with 3-4/10 R knee pain and reports being very compliant with HEP. Patient's AROM of R knee has progressed tremendously since evaluation at 4-108 deg today. Patient had no complaints of any progressed pain during treatment. Patient very minimally using FWW during ambulation although using for stabilization. Aquacell donned over incision but to be removed later today secondary to MD approval. Improved R quad activation noted with QS, SAQ. Normal vasopneumatic response noted following removal of the modality.    Personal Factors and Comorbidities  Comorbidity 3+    Comorbidities  see PMH    Examination-Activity Limitations  Squat;Stairs;Stand;Transfers    Examination-Participation Restrictions  Driving;Community Activity;Yard Work;Other    Stability/Clinical Decision Making  Stable/Uncomplicated    Rehab Potential  Excellent    PT Frequency  3x / week    PT Duration  8 weeks    PT Treatment/Interventions  ADLs/Self Care Home  Management;Cryotherapy;Electrical Stimulation;Functional mobility training;Stair training;Gait training;Therapeutic activities;Therapeutic exercise;Balance training;Neuromuscular re-education;Patient/family education;Manual techniques;Passive range of motion;Taping;Vasopneumatic Device    PT Next Visit Plan  Continue progressing functional ROM and strengthening.    Consulted and Agree with Plan of Care  Patient       Patient will benefit from skilled therapeutic intervention in order to improve the following deficits and impairments:  Pain, Decreased activity tolerance, Impaired flexibility, Decreased strength, Decreased range of motion, Increased edema, Difficulty walking, Abnormal gait  Visit Diagnosis: Acute pain of right knee  Stiffness of right knee, not elsewhere classified  Difficulty in walking,  not elsewhere classified  Localized edema     Problem List Patient Active Problem List   Diagnosis Date Noted  . Status post total knee replacement, right 07/22/2019  . Chronic pain of right knee 05/29/2019  . Numbness and tingling 05/29/2019  . CKD (chronic kidney disease) stage 3, GFR 30-59 ml/min 05/20/2019  . Paresthesia 01/17/2019  . Upper airway cough syndrome 09/19/2018  . DOE (dyspnea on exertion) 08/08/2018  . Abnormal cardiac CT angiography 01/17/2018  . Angina, class II ([place]) 01/17/2018  . Incisional hernia 08/11/2011  . PROSTATE CANCER 10/05/2009  . Coronary atherosclerosis 10/05/2009  . ABNORMAL EKG 10/07/2008  . Essential hypertension 04/13/2007  . DIVERTICULOSIS, COLON 04/13/2007  . Osteoarthritis 04/13/2007  . Hyperlipidemia with target LDL less than 70 09/26/2006    Standley Brooking, PTA 07/29/2019, 3:42 PM  [facility] [place], [place], [address] Phone: [phone]   Fax:  [phone]  Name: [name] [name]. MRN: [id number] Date of Birth: 1945/09/16

## 2019-07-31 ENCOUNTER — Other Ambulatory Visit: Payer: Self-pay

## 2019-07-31 ENCOUNTER — Encounter: Payer: Self-pay | Admitting: Physical Therapy

## 2019-07-31 ENCOUNTER — Ambulatory Visit: Payer: Medicare Other | Admitting: Physical Therapy

## 2019-07-31 DIAGNOSIS — R600 Localized edema: Secondary | ICD-10-CM

## 2019-07-31 DIAGNOSIS — M25661 Stiffness of right knee, not elsewhere classified: Secondary | ICD-10-CM

## 2019-07-31 DIAGNOSIS — M25561 Pain in right knee: Secondary | ICD-10-CM

## 2019-07-31 DIAGNOSIS — R262 Difficulty in walking, not elsewhere classified: Secondary | ICD-10-CM | POA: Diagnosis not present

## 2019-07-31 NOTE — Therapy (Signed)
[facility] [place], [place], [address] Phone: [phone]   Fax:  [phone]  Physical Therapy Treatment  Patient Details  Name: [name] [name]. MRN: [id number] Date of Birth: 06-20-1945 Referring Provider (PT): Esmond Plants, MD   Encounter Date: 07/31/2019  PT End of Session - 07/31/19 1015    Visit Number  3    Number of Visits  24    Date for PT Re-Evaluation  09/19/19    Authorization Type  KX modifier after visit 15, progress note every 10th visit    PT Start Time  0947    PT Stop Time  1036    PT Time Calculation (min)  49 min    Equipment Utilized During Treatment  Other (comment)   FWW   Activity Tolerance  Patient tolerated treatment well    Behavior During Therapy  [facility] for tasks assessed/performed       Past Medical History:  Diagnosis Date  . ABNORMAL EKG 10/07/2008  . ADVEF, DRUG/MED/BIOL SUBST, OTHER DRUG NOS 09/26/2006  . Agent orange exposure   . Allergy    seasonal  . Cataract    left eye-surgery 07-2015  . DEGENERATIVE JOINT DISEASE, MODERATE 04/13/2007  . DIVERTICULOSIS, COLON 04/13/2007  . ELEVATED PROSTATE SPECIFIC ANTIGEN 04/14/2007  . GERD (gastroesophageal reflux disease)   . Glaucoma    sees [name]   . History of kidney stones    1970   . HYPERLIPIDEMIA 09/26/2006  . HYPERTENSION 04/13/2007  . JOINT EFFUSION, KNEE 06/15/2009  . Melanoma [facility])    sees [name]   . Numbness and tingling   . PROSTATE CANCER 10/05/2009   sees [name]  . Renal insufficiency    stage III  Kidney     Past Surgical History:  Procedure Laterality Date  . ACROMIOPLASTY Right 05-06-14   per [name]   . BUNIONECTOMY     bilateral  . CATARACT EXTRACTION Left    per [name]   . COLONOSCOPY  07/10/2015   per [name], benign polyp, repeat in 10 yrs   . CORONARY STENT INTERVENTION N/A 01/17/2018   Procedure: CORONARY STENT INTERVENTION;  Surgeon: [name], MD;  Location: [place] [facility];  Service: Cardiovascular;  Laterality: N/A;  . CYSTECTOMY     benign from hand  . GLAUCOMA SURGERY Bilateral 2014   . heart stent    . INSERTION OF MESH N/A 01/10/2013   Procedure: INSERTION OF MESH;  Surgeon: Earnstine Regal, MD;  Location: [place];  Service: General;  Laterality: N/A;  . KNEE ARTHROSCOPY Right Jan. 2013   right knee, per [name]   . KNEE ARTHROSCOPY Left 01-14-15   per [name]   . LEFT HEART CATH AND CORONARY ANGIOGRAPHY N/A 01/17/2018   Procedure: LEFT HEART CATH AND CORONARY ANGIOGRAPHY;  Surgeon: [name], MD;  Location: [place] [facility];  Service: Cardiovascular;  Laterality: N/A;  . LEFT HEART CATH AND CORONARY ANGIOGRAPHY N/A 04/06/2018   Procedure: LEFT HEART CATH AND CORONARY ANGIOGRAPHY;  Surgeon: [name], MD;  Location: [place] [facility];  Service: Cardiovascular;  Laterality: N/A;  . MELANOMA EXCISION  2012   per [name]  . PROSTATECTOMY     per [name]  . TONSILLECTOMY    . TOTAL KNEE ARTHROPLASTY Right 07/22/2019   Procedure: TOTAL KNEE ARTHROPLASTY;  Surgeon: Netta Cedars, MD;  Location: [facility];  Service: Orthopedics;  Laterality: Right;  . UMBILICAL HERNIA REPAIR  11/01/2011   Procedure: HERNIA REPAIR UMBILICAL ADULT;  Surgeon: Earnstine Regal, MD;  Location: [place];  Service: General;  Laterality: N/A;  Repair umbilical hernia with mesh patch  . VASECTOMY    . VENTRAL HERNIA REPAIR  01/10/2013   with mesh     [name]  . VENTRAL HERNIA REPAIR N/A 01/10/2013   Procedure: HERNIA REPAIR VENTRAL ADULT ;  Surgeon: Earnstine Regal, MD;  Location: [place];  Service: General;  Laterality: N/A;    There were no vitals filed for this visit.  Subjective Assessment - 07/31/19 1010    Subjective  COVID screening performed on patient upon arrival. Patient reports more soreness today but went up a flight of stairs this morning with no problems.    Pertinent History  2009 prostate CA, kidney disease, cardiac  stent placment    Limitations  Standing;House hold activities;Walking;Writing    Currently in Pain?  Yes    Pain Score  5     Pain Location  Knee    Pain Orientation  Right    Pain Descriptors / Indicators  Sore    Pain Type  Surgical pain    Pain Onset  1 to 4 weeks ago    Pain Frequency  Intermittent         [facility] PT Assessment - 07/31/19 0001      Assessment   Medical Diagnosis  R TKA     Referring Provider (PT)  Esmond Plants, MD    Onset Date/Surgical Date  07/22/19    Hand Dominance  Left    Next MD Visit  08/01/2019    Prior Therapy  yes for rotator cuff      Precautions   Precautions  None      Restrictions   Weight Bearing Restrictions  No      ROM / Strength   AROM / PROM / Strength  AROM      AROM   Overall AROM   Deficits    AROM Assessment Site  Knee    Right/Left Knee  Right    Right Knee Extension  4    Right Knee Flexion  103                    [facility] Adult PT Treatment/Exercise - 07/31/19 0001      Knee/Hip Exercises: Aerobic   Nustep  L3, seat 9-7 x16 min      Knee/Hip Exercises: Standing   Forward Lunges  Right;15 reps;2 seconds    Forward Step Up  Right;20 reps;Hand Hold: 2;Step Height: 6"    Rocker Board  2 minutes      Knee/Hip Exercises: Supine   Quad Sets  AROM;Right;15 reps    [employer] Limitations  5 sec holds    Short Arc [employer]  AROM;Right;15 reps    Short Arc Quad Sets Limitations  5 sec holds      Modalities   Modalities  Vasopneumatic      Vasopneumatic   Number Minutes Vasopneumatic   10 minutes    Vasopnuematic Location   Knee    Vasopneumatic Pressure  Medium    Vasopneumatic Temperature   68      Manual Therapy   Manual Therapy  Soft tissue mobilization    Soft tissue mobilization  Mobilizations along incision to promote healing and reduction of adhesions  PT Short Term Goals - 07/25/19 1735      PT SHORT TERM GOAL #1   Title  Pt will be albe to perform 15 SLR on R LE and  amb without RW.    Time  3    Period  Weeks    Status  New    Target Date  08/15/19        PT Long Term Goals - 07/25/19 1736      PT LONG TERM GOAL #1   Title  Ind with advanced HEP.    Time  8    Period  Weeks    Status  New      PT LONG TERM GOAL #2   Title  Pt will be able to amb with no device with step through gait pattern on community level surfaces.    Time  8    Period  Weeks    Status  New      PT LONG TERM GOAL #3   Title  Pt will improve his R knee flexion to 125 degrees in order to be able to improve functional mobility and gait.    Baseline  AROM: 15-70 degrees    Time  8    Period  Weeks    Status  New      PT LONG TERM GOAL #4   Title  Pt will be able to reach full knee extension of 0 degrees in R LE to imrpove gait pattern.    Baseline  15 degrees    Time  8    Period  Weeks    Status  New      PT LONG TERM GOAL #5   Title  Pt will be able to climb a flight of stairs with no rail with no pain reported.    Time  8    Period  Weeks    Status  New            Plan - 07/31/19 1044    Clinical Impression Statement  Patient doing very well following R TKR and is nine days post op as of 07/31/2019. Patient compliant with TED hose as it was donned upon arrival and is using FWW lightly for ambulation. Aquacell removed last night by patient and his wife by MD approval per patient report. Patient somewhat disappointed with ROM measurements taken today but did report more pain today which he was educated could limit ROM. Fairly good R quad activation noted actively with therex. Normal vasopneumatic response noted following removal of the modality.    Personal Factors and Comorbidities  Comorbidity 3+    Comorbidities  see PMH    Examination-Activity Limitations  Squat;Stairs;Stand;Transfers    Examination-Participation Restrictions  Driving;Community Activity;Yard Work;Other    Stability/Clinical Decision Making  Stable/Uncomplicated    Rehab Potential   Excellent    PT Frequency  3x / week    PT Duration  8 weeks    PT Treatment/Interventions  ADLs/Self Care Home Management;Cryotherapy;Electrical Stimulation;Functional mobility training;Stair training;Gait training;Therapeutic activities;Therapeutic exercise;Balance training;Neuromuscular re-education;Patient/family education;Manual techniques;Passive range of motion;Taping;Vasopneumatic Device    PT Next Visit Plan  Continue progressing functional ROM and strengthening.    Consulted and Agree with Plan of Care  Patient       Patient will benefit from skilled therapeutic intervention in order to improve the following deficits and impairments:  Pain, Decreased activity tolerance, Impaired flexibility, Decreased strength, Decreased range of motion, Increased edema, Difficulty walking, Abnormal gait  Visit  Diagnosis: Acute pain of right knee  Stiffness of right knee, not elsewhere classified  Difficulty in walking, not elsewhere classified  Localized edema     Problem List Patient Active Problem List   Diagnosis Date Noted  . Status post total knee replacement, right 07/22/2019  . Chronic pain of right knee 05/29/2019  . Numbness and tingling 05/29/2019  . CKD (chronic kidney disease) stage 3, GFR 30-59 ml/min 05/20/2019  . Paresthesia 01/17/2019  . Upper airway cough syndrome 09/19/2018  . DOE (dyspnea on exertion) 08/08/2018  . Abnormal cardiac CT angiography 01/17/2018  . Angina, class II ([place]) 01/17/2018  . Incisional hernia 08/11/2011  . PROSTATE CANCER 10/05/2009  . Coronary atherosclerosis 10/05/2009  . ABNORMAL EKG 10/07/2008  . Essential hypertension 04/13/2007  . DIVERTICULOSIS, COLON 04/13/2007  . Osteoarthritis 04/13/2007  . Hyperlipidemia with target LDL less than 70 09/26/2006    Standley Brooking, PTA 07/31/19 10:48 AM   [facility] [address] [place], [place], [address] Phone: [phone]   Fax:   [phone]  Name: Lilly Lovick. MRN: [id number] Date of Birth: 02/21/1946

## 2019-08-01 DIAGNOSIS — Z96652 Presence of left artificial knee joint: Secondary | ICD-10-CM | POA: Diagnosis not present

## 2019-08-01 DIAGNOSIS — Z471 Aftercare following joint replacement surgery: Secondary | ICD-10-CM | POA: Diagnosis not present

## 2019-08-02 ENCOUNTER — Other Ambulatory Visit: Payer: Self-pay

## 2019-08-02 ENCOUNTER — Ambulatory Visit: Payer: Medicare Other | Admitting: *Deleted

## 2019-08-02 DIAGNOSIS — M25561 Pain in right knee: Secondary | ICD-10-CM | POA: Diagnosis not present

## 2019-08-02 DIAGNOSIS — R262 Difficulty in walking, not elsewhere classified: Secondary | ICD-10-CM | POA: Diagnosis not present

## 2019-08-02 DIAGNOSIS — R600 Localized edema: Secondary | ICD-10-CM | POA: Diagnosis not present

## 2019-08-02 DIAGNOSIS — M25661 Stiffness of right knee, not elsewhere classified: Secondary | ICD-10-CM

## 2019-08-02 NOTE — Therapy (Signed)
[facility] [place], [place], [address] Phone: [phone]   Fax:  [phone]  Physical Therapy Treatment  Patient Details  Name: [name] [name]. MRN: [id number] Date of Birth: 1945/03/29 Referring Provider (PT): Esmond Plants, MD   Encounter Date: 08/02/2019  PT End of Session - 08/02/19 0951    Visit Number  4    Number of Visits  24    Date for PT Re-Evaluation  09/19/19    Authorization Type  KX modifier after visit 15, progress note every 10th visit    PT Start Time  0945    PT Stop Time  1043    PT Time Calculation (min)  58 min       Past Medical History:  Diagnosis Date  . ABNORMAL EKG 10/07/2008  . ADVEF, DRUG/MED/BIOL SUBST, OTHER DRUG NOS 09/26/2006  . Agent orange exposure   . Allergy    seasonal  . Cataract    left eye-surgery 07-2015  . DEGENERATIVE JOINT DISEASE, MODERATE 04/13/2007  . DIVERTICULOSIS, COLON 04/13/2007  . ELEVATED PROSTATE SPECIFIC ANTIGEN 04/14/2007  . GERD (gastroesophageal reflux disease)   . Glaucoma    sees [name]   . History of kidney stones    1970   . HYPERLIPIDEMIA 09/26/2006  . HYPERTENSION 04/13/2007  . JOINT EFFUSION, KNEE 06/15/2009  . Melanoma [facility])    sees [name]   . Numbness and tingling   . PROSTATE CANCER 10/05/2009   sees [name]  . Renal insufficiency    stage III [facility]     Past Surgical History:  Procedure Laterality Date  . ACROMIOPLASTY Right 05-06-14   per [name]   . BUNIONECTOMY     bilateral  . CATARACT EXTRACTION Left    per [name]   . COLONOSCOPY  07/10/2015   per [name], benign polyp, repeat in 10 yrs   . CORONARY STENT INTERVENTION N/A 01/17/2018   Procedure: CORONARY STENT INTERVENTION;  Surgeon: [name], MD;  Location: [place] [facility];  Service: Cardiovascular;  Laterality: N/A;  . CYSTECTOMY     benign from hand  . GLAUCOMA SURGERY Bilateral 2014   . heart stent    . INSERTION OF MESH N/A 01/10/2013    Procedure: INSERTION OF MESH;  Surgeon: Earnstine Regal, MD;  Location: [place];  Service: General;  Laterality: N/A;  . KNEE ARTHROSCOPY Right Jan. 2013   right knee, per [name]   . KNEE ARTHROSCOPY Left 01-14-15   per [name]   . LEFT HEART CATH AND CORONARY ANGIOGRAPHY N/A 01/17/2018   Procedure: LEFT HEART CATH AND CORONARY ANGIOGRAPHY;  Surgeon: [name], MD;  Location: [place] [facility];  Service: Cardiovascular;  Laterality: N/A;  . LEFT HEART CATH AND CORONARY ANGIOGRAPHY N/A 04/06/2018   Procedure: LEFT HEART CATH AND CORONARY ANGIOGRAPHY;  Surgeon: [name], MD;  Location: [place] [facility];  Service: Cardiovascular;  Laterality: N/A;  . MELANOMA EXCISION  2012   per [name]  . PROSTATECTOMY     per [name]  . TONSILLECTOMY    . TOTAL KNEE ARTHROPLASTY Right 07/22/2019   Procedure: TOTAL KNEE ARTHROPLASTY;  Surgeon: Netta Cedars, MD;  Location[facility];  Service: Orthopedics;  Laterality: Right;  . UMBILICAL HERNIA REPAIR  11/01/2011   Procedure: HERNIA REPAIR UMBILICAL ADULT;  Surgeon: Earnstine Regal, MD;  Location: [place];  Service: General;  Laterality: N/A;  Repair umbilical hernia with mesh patch  . VASECTOMY    . VENTRAL HERNIA REPAIR  01/10/2013   with mesh     [name]  . VENTRAL HERNIA REPAIR N/A 01/10/2013   Procedure: HERNIA REPAIR VENTRAL ADULT ;  Surgeon: Earnstine Regal, MD;  Location: [place];  Service: General;  Laterality: N/A;    There were no vitals filed for this visit.  Subjective Assessment - 08/02/19 0950    Subjective  COVID screening performed on patient upon arrival. MD yesterday and was pleased with status    Patient is accompained by:  Family member    Pertinent History  2009 prostate CA, kidney disease, cardiac stent placment    Currently in Pain?  Yes    Pain Score  5     Pain Location  Knee    Pain Orientation  Right    Pain Descriptors / Indicators  Sore    Pain Type  Surgical pain    Pain Onset  1  to 4 weeks ago                        [facility] Adult PT Treatment/Exercise - 08/02/19 0001      Exercises   Exercises  Knee/Hip      Knee/Hip Exercises: Aerobic   Nustep  L3, seat 8-6 x15 min      Knee/Hip Exercises: Standing   Forward Lunges  Right;15 reps;2 seconds    Rocker Board  3 minutes      Knee/Hip Exercises: Seated   Long Arc Quad  Right;20 reps;1 set      Modalities   Modalities  Vasopneumatic      Vasopneumatic   Number Minutes Vasopneumatic   15 minutes    Vasopnuematic Location   Knee    Vasopneumatic Pressure  Medium    Vasopneumatic Temperature   68      Manual Therapy   Manual Therapy  Soft tissue mobilization    Soft tissue mobilization  Mobilizations along incision to promote healing and reduction of adhesions    Passive ROM  PROM of R knee into flexion with holds at end range               PT Short Term Goals - 07/25/19 1735      PT SHORT TERM GOAL #1   Title  Pt will be albe to perform 15 SLR on R LE and amb without RW.    Time  3    Period  Weeks    Status  New    Target Date  08/15/19        PT Long Term Goals - 07/25/19 1736      PT LONG TERM GOAL #1   Title  Ind with advanced HEP.    Time  8    Period  Weeks    Status  New      PT LONG TERM GOAL #2   Title  Pt will be able to amb with no device with step through gait pattern on community level surfaces.    Time  8    Period  Weeks    Status  New      PT LONG TERM GOAL #3   Title  Pt will improve his R knee flexion to 125 degrees in order to be able to improve functional mobility and gait.    Baseline  AROM: 15-70 degrees    Time  8    Period  Weeks    Status  New      PT LONG TERM GOAL #4   Title  Pt will be able to reach full knee extension of 0 degrees in R LE to imrpove gait pattern.    Baseline  15 degrees    Time  8    Period  Weeks    Status  New      PT LONG TERM GOAL #5   Title  Pt will be able to climb a flight of stairs with no rail  with no pain reported.    Time  8    Period  Weeks    Status  New            Plan - 08/02/19 1029    Clinical Impression Statement  Pt arrived today doing fairly well and reports f/u with MD yesterday with a good report. Pt did well with therex for ROM progression and quad activation f/b PROM for flexion and extension ROM. PROM 0-105 degrees today, AROM 5-100 degrees. Normal modality response    Personal Factors and Comorbidities  Comorbidity 3+    Comorbidities  see PMH    Examination-Activity Limitations  Squat;Stairs;Stand;Transfers    Examination-Participation Restrictions  Driving;Community Activity;Yard Work;Other    Stability/Clinical Decision Making  Stable/Uncomplicated    PT Frequency  3x / week    PT Duration  8 weeks    PT Treatment/Interventions  ADLs/Self Care Home Management;Cryotherapy;Electrical Stimulation;Functional mobility training;Stair training;Gait training;Therapeutic activities;Therapeutic exercise;Balance training;Neuromuscular re-education;Patient/family education;Manual techniques;Passive range of motion;Taping;Vasopneumatic Device    PT Next Visit Plan  Continue progressing functional ROM and strengthening.       Patient will benefit from skilled therapeutic intervention in order to improve the following deficits and impairments:  Pain, Decreased activity tolerance, Impaired flexibility, Decreased strength, Decreased range of motion, Increased edema, Difficulty walking, Abnormal gait  Visit Diagnosis: Acute pain of right knee  Stiffness of right knee, not elsewhere classified     Problem List Patient Active Problem List   Diagnosis Date Noted  . Status post total knee replacement, right 07/22/2019  . Chronic pain of right knee 05/29/2019  . Numbness and tingling 05/29/2019  . CKD (chronic kidney disease) stage 3, GFR 30-59 ml/min 05/20/2019  . Paresthesia 01/17/2019  . Upper airway cough syndrome 09/19/2018  . DOE (dyspnea on exertion)  08/08/2018  . Abnormal cardiac CT angiography 01/17/2018  . Angina, class II ([place]) 01/17/2018  . Incisional hernia 08/11/2011  . PROSTATE CANCER 10/05/2009  . Coronary atherosclerosis 10/05/2009  . ABNORMAL EKG 10/07/2008  . Essential hypertension 04/13/2007  . DIVERTICULOSIS, COLON 04/13/2007  . Osteoarthritis 04/13/2007  . Hyperlipidemia with target LDL less than 70 09/26/2006    Nasif Bos,CHRIS, PTA 08/02/2019, 1:03 PM  [facility] [place], [place], [address] Phone: [phone]   Fax:  [phone]  Name: [name] [name]. MRN: [id number] Date of Birth: 1945/09/26

## 2019-08-06 ENCOUNTER — Ambulatory Visit: Payer: Medicare Other | Attending: Orthopedic Surgery | Admitting: Physical Therapy

## 2019-08-06 ENCOUNTER — Other Ambulatory Visit: Payer: Self-pay

## 2019-08-06 ENCOUNTER — Encounter: Payer: Self-pay | Admitting: Physical Therapy

## 2019-08-06 DIAGNOSIS — M25561 Pain in right knee: Secondary | ICD-10-CM | POA: Insufficient documentation

## 2019-08-06 DIAGNOSIS — R600 Localized edema: Secondary | ICD-10-CM | POA: Diagnosis present

## 2019-08-06 DIAGNOSIS — M25661 Stiffness of right knee, not elsewhere classified: Secondary | ICD-10-CM | POA: Diagnosis present

## 2019-08-06 DIAGNOSIS — R262 Difficulty in walking, not elsewhere classified: Secondary | ICD-10-CM | POA: Diagnosis present

## 2019-08-06 NOTE — Therapy (Signed)
[facility] [place], [place], [address] Phone: [phone]   Fax:  [phone]  Physical Therapy Treatment  Patient Details  Name: [name] [name]. MRN: [id number] Date of Birth: [birth year] Referring Provider (PT): Esmond Plants, MD   Encounter Date: 08/06/2019  PT End of Session - 08/06/19 0951    Visit Number  5    Number of Visits  24    Date for PT Re-Evaluation  09/19/19    Authorization Type  KX modifier after visit 15, progress note every 10th visit    PT Start Time  0945    PT Stop Time  1028    PT Time Calculation (min)  43 min    Equipment Utilized During Treatment  Other (comment)   FWW   Activity Tolerance  Patient tolerated treatment well    Behavior During Therapy  WFL for tasks assessed/performed       Past Medical History:  Diagnosis Date  . ABNORMAL EKG 10/07/2008  . ADVEF, DRUG/MED/BIOL SUBST, OTHER DRUG NOS 09/26/2006  . Agent orange exposure   . Allergy    seasonal  . Cataract    left eye-surgery 07-2015  . DEGENERATIVE JOINT DISEASE, MODERATE 04/13/2007  . DIVERTICULOSIS, COLON 04/13/2007  . ELEVATED PROSTATE SPECIFIC ANTIGEN 04/14/2007  . GERD (gastroesophageal reflux disease)   . Glaucoma    sees [name]   . History of kidney stones    1970   . HYPERLIPIDEMIA 09/26/2006  . HYPERTENSION 04/13/2007  . JOINT EFFUSION, KNEE 06/15/2009  . Melanoma [facility])    sees [name]   . Numbness and tingling   . PROSTATE CANCER 10/05/2009   sees [name]  . Renal insufficiency    stage III [facility]     Past Surgical History:  Procedure Laterality Date  . ACROMIOPLASTY Right 05-06-14   per [name]   . BUNIONECTOMY     bilateral  . CATARACT EXTRACTION Left    per [name]   . COLONOSCOPY  07/10/2015   per [name], benign polyp, repeat in 10 yrs   . CORONARY STENT INTERVENTION N/A 01/17/2018   Procedure: CORONARY STENT INTERVENTION;  Surgeon: [name], MD;  Location: [place] [facility];  Service: Cardiovascular;  Laterality: N/A;  . CYSTECTOMY     benign from hand  . GLAUCOMA SURGERY Bilateral 2014   . heart stent    . INSERTION OF MESH N/A 01/10/2013   Procedure: INSERTION OF MESH;  Surgeon: Earnstine Regal, MD;  Location: [place];  Service: General;  Laterality: N/A;  . KNEE ARTHROSCOPY Right Jan. 2013   right knee, per [name]   . KNEE ARTHROSCOPY Left 01-14-15   per [name]   . LEFT HEART CATH AND CORONARY ANGIOGRAPHY N/A 01/17/2018   Procedure: LEFT HEART CATH AND CORONARY ANGIOGRAPHY;  Surgeon: [name], MD;  Location: [place] [facility];  Service: Cardiovascular;  Laterality: N/A;  . LEFT HEART CATH AND CORONARY ANGIOGRAPHY N/A 04/06/2018   Procedure: LEFT HEART CATH AND CORONARY ANGIOGRAPHY;  Surgeon: [name], MD;  Location: [place] [facility];  Service: Cardiovascular;  Laterality: N/A;  . MELANOMA EXCISION  2012   per [name]  . PROSTATECTOMY     per [name]  . TONSILLECTOMY    . TOTAL KNEE ARTHROPLASTY Right 07/22/2019   Procedure: TOTAL KNEE ARTHROPLASTY;  Surgeon: Netta Cedars, MD;  [facility];  Service: Orthopedics;  Laterality: Right;  . UMBILICAL HERNIA REPAIR  11/01/2011   Procedure: HERNIA REPAIR UMBILICAL ADULT;  Surgeon: Earnstine Regal, MD;  Location: [place];  Service: General;  Laterality: N/A;  Repair umbilical hernia with mesh patch  . VASECTOMY    . VENTRAL HERNIA REPAIR  01/10/2013   with mesh     [name]  . VENTRAL HERNIA REPAIR N/A 01/10/2013   Procedure: HERNIA REPAIR VENTRAL ADULT ;  Surgeon: Earnstine Regal, MD;  Location: [place];  Service: General;  Laterality: N/A;    There were no vitals filed for this visit.  Subjective Assessment - 08/06/19 0950    Subjective  COVID screening performed on patient upon arrival. Reports he thinks he overdid his exercises over the weekend and a little sore.    Pertinent History  2009 prostate CA, kidney disease, cardiac stent placment     Limitations  Standing;House hold activities;Walking;Writing    Currently in Pain?  Yes    Pain Score  5     Pain Location  Knee    Pain Orientation  Right    Pain Descriptors / Indicators  Sore    Pain Type  Surgical pain    Pain Onset  1 to 4 weeks ago    Pain Frequency  Intermittent         [facility] PT Assessment - 08/06/19 0001      Assessment   Medical Diagnosis  R TKA     Referring Provider (PT)  Esmond Plants, MD    Onset Date/Surgical Date  07/22/19    Hand Dominance  Left    Next MD Visit  08/2019    Prior Therapy  yes for rotator cuff      Precautions   Precautions  None      Restrictions   Weight Bearing Restrictions  No      ROM / Strength   AROM / PROM / Strength  AROM      AROM   Overall AROM   Within functional limits for tasks performed    AROM Assessment Site  Knee    Right/Left Knee  Right    Right Knee Extension  3    Right Knee Flexion  115                    [facility] Adult PT Treatment/Exercise - 08/06/19 0001      Knee/Hip Exercises: Aerobic   Nustep  L3, seat 6-5 x17 min      Knee/Hip Exercises: Standing   Rocker Board  2 minutes      Knee/Hip Exercises: Supine   Short Arc Quad Sets  AROM;Right;2 sets;10 reps    Straight Leg Raises  AROM;Right;2 sets;10 reps      Modalities   Modalities  Vasopneumatic      Vasopneumatic   Number Minutes Vasopneumatic   10 minutes    Vasopnuematic Location   Knee    Vasopneumatic Pressure  Medium    Vasopneumatic Temperature   36      Manual Therapy   Manual Therapy  Soft tissue mobilization;Passive ROM    Soft tissue mobilization  R incision mobilizations to promote healthy mobility    Passive ROM  PROM of R knee into flexion, extension with holds at end range               PT Short Term Goals - 08/06/19 1042      PT SHORT TERM GOAL #1  Title  Pt will be albe to perform 15 SLR on R LE and amb without RW.    Time  3    Period  Weeks    Status  Partially Met    Target Date   08/15/19        PT Long Term Goals - 08/06/19 1041      PT LONG TERM GOAL #1   Title  Ind with advanced HEP.    Time  8    Period  Weeks    Status  On-going      PT LONG TERM GOAL #2   Title  Pt will be able to amb with no device with step through gait pattern on community level surfaces.    Time  8    Period  Weeks    Status  On-going      PT LONG TERM GOAL #3   Title  Pt will improve his R knee flexion to 125 degrees in order to be able to improve functional mobility and gait.    Baseline  AROM: 15-70 degrees    Time  8    Period  Weeks    Status  On-going      PT LONG TERM GOAL #4   Title  Pt will be able to reach full knee extension of 0 degrees in R LE to imrpove gait pattern.    Baseline  15 degrees    Time  8    Period  Weeks    Status  On-going      PT LONG TERM GOAL #5   Title  Pt will be able to climb a flight of stairs with no rail with no pain reported.    Time  8    Period  Weeks    Status  On-going            Plan - 08/06/19 1027    Clinical Impression Statement  Patient presented in clinic with reports of knee soreness from exercises. Patient still has some scabbing over R knee incision at this time. Patient able to demonstrate great active quad activation. R knee AROM measured as 3-115 deg. Normal vasopnuematic response noted following removal of the modality.    Personal Factors and Comorbidities  Comorbidity 3+    Comorbidities  see PMH    Examination-Activity Limitations  Squat;Stairs;Stand;Transfers    Examination-Participation Restrictions  Driving;Community Activity;Yard Work;Other    Stability/Clinical Decision Making  Stable/Uncomplicated    Rehab Potential  Excellent    PT Frequency  3x / week    PT Duration  8 weeks    PT Treatment/Interventions  ADLs/Self Care Home Management;Cryotherapy;Electrical Stimulation;Functional mobility training;Stair training;Gait training;Therapeutic activities;Therapeutic exercise;Balance  training;Neuromuscular re-education;Patient/family education;Manual techniques;Passive range of motion;Taping;Vasopneumatic Device    PT Next Visit Plan  Continue progressing functional ROM and strengthening.    Consulted and Agree with Plan of Care  Patient       Patient will benefit from skilled therapeutic intervention in order to improve the following deficits and impairments:  Pain, Decreased activity tolerance, Impaired flexibility, Decreased strength, Decreased range of motion, Increased edema, Difficulty walking, Abnormal gait  Visit Diagnosis: Acute pain of right knee  Stiffness of right knee, not elsewhere classified  Difficulty in walking, not elsewhere classified  Localized edema     Problem List Patient Active Problem List   Diagnosis Date Noted  . Status post total knee replacement, right 07/22/2019  . Chronic pain of right knee 05/29/2019  . Numbness  and tingling 05/29/2019  . CKD (chronic kidney disease) stage 3, GFR 30-59 ml/min 05/20/2019  . Paresthesia 01/17/2019  . Upper airway cough syndrome 09/19/2018  . DOE (dyspnea on exertion) 08/08/2018  . Abnormal cardiac CT angiography 01/17/2018  . Angina, class II ([place]) 01/17/2018  . Incisional hernia 08/11/2011  . PROSTATE CANCER 10/05/2009  . Coronary atherosclerosis 10/05/2009  . ABNORMAL EKG 10/07/2008  . Essential hypertension 04/13/2007  . DIVERTICULOSIS, COLON 04/13/2007  . Osteoarthritis 04/13/2007  . Hyperlipidemia with target LDL less than 70 09/26/2006    Standley Brooking, PTA 08/06/2019, 10:42 AM  [facility] [address] [place], [place], [address] Phone: [phone]   Fax:  [phone]  Name: Xaiden Fleig. MRN: [id number] Date of Birth: [birth year]

## 2019-08-07 ENCOUNTER — Encounter: Payer: Self-pay | Admitting: Physical Therapy

## 2019-08-07 ENCOUNTER — Ambulatory Visit: Payer: Medicare Other | Admitting: Physical Therapy

## 2019-08-07 ENCOUNTER — Other Ambulatory Visit: Payer: Self-pay

## 2019-08-07 ENCOUNTER — Telehealth: Payer: Self-pay | Admitting: Family Medicine

## 2019-08-07 DIAGNOSIS — M25661 Stiffness of right knee, not elsewhere classified: Secondary | ICD-10-CM

## 2019-08-07 DIAGNOSIS — R262 Difficulty in walking, not elsewhere classified: Secondary | ICD-10-CM

## 2019-08-07 DIAGNOSIS — M25561 Pain in right knee: Secondary | ICD-10-CM

## 2019-08-07 DIAGNOSIS — R600 Localized edema: Secondary | ICD-10-CM

## 2019-08-07 NOTE — Progress Notes (Signed)
  Chronic Care Management   Note  08/07/2019 Name: [name] [name]. MRN: [id number] DOB: December 14, 1945  [name]. is a 74 y.o. year old male who is a primary care patient of [name], MD. I reached out to [name] by phone today in response to a referral sent by Mr. Sherrian Divers Jr.'s PCP, [name], MD.   Mr. Laur was given information about Chronic Care Management services today including:  1. CCM service includes personalized support from designated clinical staff supervised by his physician, including individualized plan of care and coordination with other care providers 2. 24/7 contact phone numbers for assistance for urgent and routine care needs. 3. Service will only be billed when office clinical staff spend 20 minutes or more in a month to coordinate care. 4. Only one practitioner may furnish and bill the service in a calendar month. 5. The patient may stop CCM services at any time (effective at the end of the month) by phone call to the office staff.   Patient agreed to services and verbal consent obtained.   This note is not being shared with the patient for the following reason: To respect privacy (The patient or proxy has requested that the information not be shared).   Follow up plan:   [place]

## 2019-08-07 NOTE — Therapy (Signed)
[facility] [place], [place], [address] Phone: [phone]   Fax:  [phone]  Physical Therapy Treatment  Patient Details  Name: [name] [name]. MRN: [id number] Date of Birth: 1945-08-27 Referring Provider (PT): Esmond Plants, MD   Encounter Date: 08/07/2019  PT End of Session - 08/07/19 1056    Visit Number  6    Number of Visits  24    Date for PT Re-Evaluation  09/19/19    Authorization Type  KX modifier after visit 15, progress note every 10th visit    PT Start Time  0945    PT Stop Time  1040    PT Time Calculation (min)  55 min    Activity Tolerance  Patient tolerated treatment well    Behavior During Therapy  [facility] for tasks assessed/performed       Past Medical History:  Diagnosis Date  . ABNORMAL EKG 10/07/2008  . ADVEF, DRUG/MED/BIOL SUBST, OTHER DRUG NOS 09/26/2006  . Agent orange exposure   . Allergy    seasonal  . Cataract    left eye-surgery 07-2015  . DEGENERATIVE JOINT DISEASE, MODERATE 04/13/2007  . DIVERTICULOSIS, COLON 04/13/2007  . ELEVATED PROSTATE SPECIFIC ANTIGEN 04/14/2007  . GERD (gastroesophageal reflux disease)   . Glaucoma    sees [name]   . History of kidney stones    1970   . HYPERLIPIDEMIA 09/26/2006  . HYPERTENSION 04/13/2007  . JOINT EFFUSION, KNEE 06/15/2009  . Melanoma [facility])    sees [name]   . Numbness and tingling   . PROSTATE CANCER 10/05/2009   sees [name]  . Renal insufficiency    stage III [facility]     Past Surgical History:  Procedure Laterality Date  . ACROMIOPLASTY Right 05-06-14   per [name]   . BUNIONECTOMY     bilateral  . CATARACT EXTRACTION Left    per [name]   . COLONOSCOPY  07/10/2015   per [name], benign polyp, repeat in 10 yrs   . CORONARY STENT INTERVENTION N/A 01/17/2018   Procedure: CORONARY STENT INTERVENTION;  Surgeon: [name], MD;  Location: [place] [facility];  Service: Cardiovascular;  Laterality: N/A;  .  CYSTECTOMY     benign from hand  . GLAUCOMA SURGERY Bilateral 2014   . heart stent    . INSERTION OF MESH N/A 01/10/2013   Procedure: INSERTION OF MESH;  Surgeon: Earnstine Regal, MD;  Location: [place];  Service: General;  Laterality: N/A;  . KNEE ARTHROSCOPY Right Jan. 2013   right knee, per [name]   . KNEE ARTHROSCOPY Left 01-14-15   per [name]   . LEFT HEART CATH AND CORONARY ANGIOGRAPHY N/A 01/17/2018   Procedure: LEFT HEART CATH AND CORONARY ANGIOGRAPHY;  Surgeon: [name], MD;  Location: [place] [facility];  Service: Cardiovascular;  Laterality: N/A;  . LEFT HEART CATH AND CORONARY ANGIOGRAPHY N/A 04/06/2018   Procedure: LEFT HEART CATH AND CORONARY ANGIOGRAPHY;  Surgeon: [name], MD;  Location: [place] [facility];  Service: Cardiovascular;  Laterality: N/A;  . MELANOMA EXCISION  2012   per [name]  . PROSTATECTOMY     per [name]  . TONSILLECTOMY    . TOTAL KNEE ARTHROPLASTY Right 07/22/2019   Procedure: TOTAL KNEE ARTHROPLASTY;  Surgeon: Netta Cedars, MD;  Location[facility];  Service: Orthopedics;  Laterality: Right;  . UMBILICAL HERNIA REPAIR  11/01/2011  Procedure: HERNIA REPAIR UMBILICAL ADULT;  Surgeon: Earnstine Regal, MD;  Location: [place];  Service: General;  Laterality: N/A;  Repair umbilical hernia with mesh patch  . VASECTOMY    . VENTRAL HERNIA REPAIR  01/10/2013   with mesh     [name]  . VENTRAL HERNIA REPAIR N/A 01/10/2013   Procedure: HERNIA REPAIR VENTRAL ADULT ;  Surgeon: Earnstine Regal, MD;  Location: [place];  Service: General;  Laterality: N/A;    There were no vitals filed for this visit.  Subjective Assessment - 08/07/19 1027    Subjective  COVID-19 screen performed prior to patient entering clinic.  Doing better.    Patient is accompained by:  Family member    Pertinent History  2009 prostate CA, kidney disease, cardiac stent placment    Limitations  Standing;House hold activities;Walking;Writing    Currently  in Pain?  Yes    Pain Score  4     Pain Location  Knee    Pain Orientation  Right    Pain Descriptors / Indicators  Sore    Pain Type  Surgical pain    Pain Onset  1 to 4 weeks ago                        [facility] Adult PT Treatment/Exercise - 08/07/19 0001      Exercises   Exercises  Knee/Hip      Knee/Hip Exercises: Aerobic   Recumbent Bike  Bike at seat 5 x 5 minutes.    Nustep  Level 3 x 10 minutes moving forward x 2.      Knee/Hip Exercises: Supine   Short Arc Quad Sets Limitations  3# SAQ's facilitated with Bi-Phasic e' stim to patient's right quadriceps x 15 minutes with 10 sec extension holds and 10 sec rests.      Modalities   Modalities  Vasopneumatic      Vasopneumatic   Number Minutes Vasopneumatic   15 minutes    Vasopnuematic Location   --   Righ knee.   Vasopneumatic Pressure  Medium               PT Short Term Goals - 08/06/19 1042      PT SHORT TERM GOAL #1   Title  Pt will be albe to perform 15 SLR on R LE and amb without RW.    Time  3    Period  Weeks    Status  Partially Met    Target Date  08/15/19        PT Long Term Goals - 08/06/19 1041      PT LONG TERM GOAL #1   Title  Ind with advanced HEP.    Time  8    Period  Weeks    Status  On-going      PT LONG TERM GOAL #2   Title  Pt will be able to amb with no device with step through gait pattern on community level surfaces.    Time  8    Period  Weeks    Status  On-going      PT LONG TERM GOAL #3   Title  Pt will improve his R knee flexion to 125 degrees in order to be able to improve functional mobility and gait.    Baseline  AROM: 15-70 degrees    Time  8    Period  Weeks    Status  On-going      PT LONG TERM GOAL #4   Title  Pt will be able to reach full knee extension of 0 degrees in R LE to imrpove gait pattern.    Baseline  15 degrees    Time  8    Period  Weeks    Status  On-going      PT LONG TERM GOAL #5   Title  Pt will be able to climb a  flight of stairs with no rail with no pain reported.    Time  8    Period  Weeks    Status  On-going            Plan - 08/07/19 1101    Clinical Impression Statement  Patient did great today.  Able to make forward revolutions on bike today.  He demonstrated an excellent right quadriceps contraction with Bi-Phasic electrical stimulation.    Personal Factors and Comorbidities  Comorbidity 3+    Comorbidities  see PMH    Examination-Activity Limitations  Squat;Stairs;Stand;Transfers    Examination-Participation Restrictions  Driving;Community Activity;Yard Work;Other    Stability/Clinical Decision Making  Stable/Uncomplicated    Rehab Potential  Excellent    PT Frequency  3x / week    PT Duration  8 weeks    PT Treatment/Interventions  ADLs/Self Care Home Management;Cryotherapy;Electrical Stimulation;Functional mobility training;Stair training;Gait training;Therapeutic activities;Therapeutic exercise;Balance training;Neuromuscular re-education;Patient/family education;Manual techniques;Passive range of motion;Taping;Vasopneumatic Device    PT Next Visit Plan  Continue progressing functional ROM and strengthening.    Consulted and Agree with Plan of Care  Patient       Patient will benefit from skilled therapeutic intervention in order to improve the following deficits and impairments:  Pain, Decreased activity tolerance, Impaired flexibility, Decreased strength, Decreased range of motion, Increased edema, Difficulty walking, Abnormal gait  Visit Diagnosis: Acute pain of right knee  Stiffness of right knee, not elsewhere classified  Difficulty in walking, not elsewhere classified  Localized edema     Problem List Patient Active Problem List   Diagnosis Date Noted  . Status post total knee replacement, right 07/22/2019  . Chronic pain of right knee 05/29/2019  . Numbness and tingling 05/29/2019  . CKD (chronic kidney disease) stage 3, GFR 30-59 ml/min 05/20/2019  .  Paresthesia 01/17/2019  . Upper airway cough syndrome 09/19/2018  . DOE (dyspnea on exertion) 08/08/2018  . Abnormal cardiac CT angiography 01/17/2018  . Angina, class II ([place]) 01/17/2018  . Incisional hernia 08/11/2011  . PROSTATE CANCER 10/05/2009  . Coronary atherosclerosis 10/05/2009  . ABNORMAL EKG 10/07/2008  . Essential hypertension 04/13/2007  . DIVERTICULOSIS, COLON 04/13/2007  . Osteoarthritis 04/13/2007  . Hyperlipidemia with target LDL less than 70 09/26/2006    [place], [place] MPT 08/07/2019, 11:03 AM  [facility] [address] [place], [place], [address] Phone: [phone]   Fax:  [phone]  Name: [name] [name]. MRN: [id number] Date of Birth: February 11, 1946

## 2019-08-09 ENCOUNTER — Encounter: Payer: Self-pay | Admitting: Physical Therapy

## 2019-08-09 ENCOUNTER — Other Ambulatory Visit: Payer: Self-pay

## 2019-08-09 ENCOUNTER — Ambulatory Visit: Payer: Medicare Other | Admitting: Physical Therapy

## 2019-08-09 DIAGNOSIS — M25561 Pain in right knee: Secondary | ICD-10-CM | POA: Diagnosis not present

## 2019-08-09 DIAGNOSIS — M25661 Stiffness of right knee, not elsewhere classified: Secondary | ICD-10-CM

## 2019-08-09 NOTE — Therapy (Signed)
[facility] [place], [place], [address] Phone: [phone]   Fax:  [phone]  Physical Therapy Treatment  Patient Details  Name: [name] [name]. MRN: [id number] Date of Birth: 05/17/1945 Referring Provider (PT): Esmond Plants, MD   Encounter Date: 08/09/2019  PT End of Session - 08/09/19 1017    Visit Number  7    Number of Visits  24    Date for PT Re-Evaluation  09/19/19    Authorization Type  KX modifier after visit 15, progress note every 10th visit    PT Start Time  0945    PT Stop Time  1039    PT Time Calculation (min)  54 min    Activity Tolerance  Patient tolerated treatment well    Behavior During Therapy  [facility] for tasks assessed/performed       Past Medical History:  Diagnosis Date  . ABNORMAL EKG 10/07/2008  . ADVEF, DRUG/MED/BIOL SUBST, OTHER DRUG NOS 09/26/2006  . Agent orange exposure   . Allergy    seasonal  . Cataract    left eye-surgery 07-2015  . DEGENERATIVE JOINT DISEASE, MODERATE 04/13/2007  . DIVERTICULOSIS, COLON 04/13/2007  . ELEVATED PROSTATE SPECIFIC ANTIGEN 04/14/2007  . GERD (gastroesophageal reflux disease)   . Glaucoma    sees [name]   . History of kidney stones    1970   . HYPERLIPIDEMIA 09/26/2006  . HYPERTENSION 04/13/2007  . JOINT EFFUSION, KNEE 06/15/2009  . Melanoma [facility])    sees [name]   . Numbness and tingling   . PROSTATE CANCER 10/05/2009   sees [name]  . Renal insufficiency    stage III [facility]     Past Surgical History:  Procedure Laterality Date  . ACROMIOPLASTY Right 05-06-14   per [name]   . BUNIONECTOMY     bilateral  . CATARACT EXTRACTION Left    per [name]   . COLONOSCOPY  07/10/2015   per [name], benign polyp, repeat in 10 yrs   . CORONARY STENT INTERVENTION N/A 01/17/2018   Procedure: CORONARY STENT INTERVENTION;  Surgeon: [name], MD;  Location: [place] [facility];  Service: Cardiovascular;  Laterality: N/A;  .  CYSTECTOMY     benign from hand  . GLAUCOMA SURGERY Bilateral 2014   . heart stent    . INSERTION OF MESH N/A 01/10/2013   Procedure: INSERTION OF MESH;  Surgeon: Earnstine Regal, MD;  Location: [place];  Service: General;  Laterality: N/A;  . KNEE ARTHROSCOPY Right Jan. 2013   right knee, per [name]   . KNEE ARTHROSCOPY Left 01-14-15   per [name]   . LEFT HEART CATH AND CORONARY ANGIOGRAPHY N/A 01/17/2018   Procedure: LEFT HEART CATH AND CORONARY ANGIOGRAPHY;  Surgeon: [name], MD;  Location: [place] [facility];  Service: Cardiovascular;  Laterality: N/A;  . LEFT HEART CATH AND CORONARY ANGIOGRAPHY N/A 04/06/2018   Procedure: LEFT HEART CATH AND CORONARY ANGIOGRAPHY;  Surgeon: [name], MD;  Location: [place] [facility];  Service: Cardiovascular;  Laterality: N/A;  . MELANOMA EXCISION  2012   per [name]  . PROSTATECTOMY     per [name]  . TONSILLECTOMY    . TOTAL KNEE ARTHROPLASTY Right 07/22/2019   Procedure: TOTAL KNEE ARTHROPLASTY;  Surgeon: Netta Cedars, MD;  Location: [facility];  Service: Orthopedics;  Laterality: Right;  . UMBILICAL HERNIA REPAIR  11/01/2011  Procedure: HERNIA REPAIR UMBILICAL ADULT;  Surgeon: Earnstine Regal, MD;  Location: [place];  Service: General;  Laterality: N/A;  Repair umbilical hernia with mesh patch  . VASECTOMY    . VENTRAL HERNIA REPAIR  01/10/2013   with mesh     [name]  . VENTRAL HERNIA REPAIR N/A 01/10/2013   Procedure: HERNIA REPAIR VENTRAL ADULT ;  Surgeon: Earnstine Regal, MD;  Location: [place];  Service: General;  Laterality: N/A;    There were no vitals filed for this visit.  Subjective Assessment - 08/09/19 1014    Subjective  COVID-19 screen performed prior to patient entering clinic.  Still can't sleep real well.    Patient is accompained by:  Family member    Pertinent History  2009 prostate CA, kidney disease, cardiac stent placment    Limitations  Standing;House hold activities;Walking;Writing     Currently in Pain?  Yes    Pain Score  4     Pain Location  Knee    Pain Orientation  Right    Pain Descriptors / Indicators  Sore    Pain Type  Surgical pain    Pain Onset  1 to 4 weeks ago         [facility] PT Assessment - 08/09/19 0001      AROM   AROM Assessment Site  Knee    Right/Left Knee  Right    Right Knee Extension  3    Right Knee Flexion  115      PROM   Left Knee Flexion  120                    [facility] Adult PT Treatment/Exercise - 08/09/19 0001      Exercises   Exercises  Knee/Hip      Knee/Hip Exercises: Aerobic   Recumbent Bike  15 minutes moving seat forward per patient tolerance.      Knee/Hip Exercises: Supine   Short Arc Quad Sets Limitations  3# SAQ's facilittaed with Bi-Phasic e'stim to patient's right quads with 10 sec extension holds and 10 sec rest.      Modalities   Modalities  Electrical Stimulation;Vasopneumatic      Electrical Stimulation   Electrical Stimulation Location  Right knee.    Electrical Stimulation Action  Pre-mod.    Electrical Stimulation Parameters  80-150 Hz x 15 minutes.    Electrical Stimulation Goals  Pain      Vasopneumatic   Number Minutes Vasopneumatic   15 minutes    Vasopnuematic Location   --   RT knee.   Vasopneumatic Pressure  Medium               PT Short Term Goals - 08/06/19 1042      PT SHORT TERM GOAL #1   Title  Pt will be albe to perform 15 SLR on R LE and amb without RW.    Time  3    Period  Weeks    Status  Partially Met    Target Date  08/15/19        PT Long Term Goals - 08/06/19 1041      PT LONG TERM GOAL #1   Title  Ind with advanced HEP.    Time  8    Period  Weeks    Status  On-going      PT LONG TERM GOAL #2   Title  Pt will be able to amb  with no device with step through gait pattern on community level surfaces.    Time  8    Period  Weeks    Status  On-going      PT LONG TERM GOAL #3   Title  Pt will improve his R knee flexion to 125 degrees in  order to be able to improve functional mobility and gait.    Baseline  AROM: 15-70 degrees    Time  8    Period  Weeks    Status  On-going      PT LONG TERM GOAL #4   Title  Pt will be able to reach full knee extension of 0 degrees in R LE to imrpove gait pattern.    Baseline  15 degrees    Time  8    Period  Weeks    Status  On-going      PT LONG TERM GOAL #5   Title  Pt will be able to climb a flight of stairs with no rail with no pain reported.    Time  8    Period  Weeks    Status  On-going            Plan - 08/09/19 1025    Clinical Impression Statement  Excellent job.  Patient tolerated stationary bike very well today.  No Nustep.  Passive right knee flexion to 120 degrees.    Personal Factors and Comorbidities  Comorbidity 3+    Comorbidities  see PMH    Examination-Activity Limitations  Squat;Stairs;Stand;Transfers    Examination-Participation Restrictions  Driving;Community Activity;Yard Work;Other    Stability/Clinical Decision Making  Stable/Uncomplicated    Rehab Potential  Excellent    PT Frequency  3x / week    PT Duration  8 weeks    PT Treatment/Interventions  ADLs/Self Care Home Management;Cryotherapy;Electrical Stimulation;Functional mobility training;Stair training;Gait training;Therapeutic activities;Therapeutic exercise;Balance training;Neuromuscular re-education;Patient/family education;Manual techniques;Passive range of motion;Taping;Vasopneumatic Device    PT Next Visit Plan  Continue progressing functional ROM and strengthening.    Consulted and Agree with Plan of Care  Patient       Patient will benefit from skilled therapeutic intervention in order to improve the following deficits and impairments:  Pain, Decreased activity tolerance, Impaired flexibility, Decreased strength, Decreased range of motion, Increased edema, Difficulty walking, Abnormal gait  Visit Diagnosis: Acute pain of right knee  Stiffness of right knee, not elsewhere  classified     Problem List Patient Active Problem List   Diagnosis Date Noted  . Status post total knee replacement, right 07/22/2019  . Chronic pain of right knee 05/29/2019  . Numbness and tingling 05/29/2019  . CKD (chronic kidney disease) stage 3, GFR 30-59 ml/min 05/20/2019  . Paresthesia 01/17/2019  . Upper airway cough syndrome 09/19/2018  . DOE (dyspnea on exertion) 08/08/2018  . Abnormal cardiac CT angiography 01/17/2018  . Angina, class II ([place]) 01/17/2018  . Incisional hernia 08/11/2011  . PROSTATE CANCER 10/05/2009  . Coronary atherosclerosis 10/05/2009  . ABNORMAL EKG 10/07/2008  . Essential hypertension 04/13/2007  . DIVERTICULOSIS, COLON 04/13/2007  . Osteoarthritis 04/13/2007  . Hyperlipidemia with target LDL less than 70 09/26/2006    Juliany Daughety, [place] MPT 08/09/2019, 10:39 AM  [facility] [address] [place], [place], [address] Phone: [phone]   Fax:  [phone]  Name: Takashi Korol. MRN: [id number] Date of Birth: 1946/01/09

## 2019-08-12 ENCOUNTER — Other Ambulatory Visit: Payer: Self-pay

## 2019-08-12 ENCOUNTER — Ambulatory Visit: Payer: Medicare Other | Admitting: Physical Therapy

## 2019-08-12 DIAGNOSIS — R600 Localized edema: Secondary | ICD-10-CM

## 2019-08-12 DIAGNOSIS — M25561 Pain in right knee: Secondary | ICD-10-CM | POA: Diagnosis not present

## 2019-08-12 DIAGNOSIS — M25661 Stiffness of right knee, not elsewhere classified: Secondary | ICD-10-CM

## 2019-08-12 DIAGNOSIS — R262 Difficulty in walking, not elsewhere classified: Secondary | ICD-10-CM

## 2019-08-12 NOTE — Therapy (Signed)
[facility] [place], [place], [address] Phone: [phone]   Fax:  [phone]  Physical Therapy Treatment  Patient Details  Name: [name] [name]. MRN: [id number] Date of Birth: [birth year] Referring Provider (PT): Esmond Plants, MD   Encounter Date: 08/12/2019  PT End of Session - 08/12/19 1030    Visit Number  8    Number of Visits  24    Date for PT Re-Evaluation  09/19/19    Authorization Type  KX modifier after visit 15, progress note every 10th visit    PT Start Time  0945    PT Stop Time  1030    PT Time Calculation (min)  45 min    Activity Tolerance  Patient tolerated treatment well    Behavior During Therapy  [facility] for tasks assessed/performed       Past Medical History:  Diagnosis Date  . ABNORMAL EKG 10/07/2008  . ADVEF, DRUG/MED/BIOL SUBST, OTHER DRUG NOS 09/26/2006  . Agent orange exposure   . Allergy    seasonal  . Cataract    left eye-surgery 07-2015  . DEGENERATIVE JOINT DISEASE, MODERATE 04/13/2007  . DIVERTICULOSIS, COLON 04/13/2007  . ELEVATED PROSTATE SPECIFIC ANTIGEN 04/14/2007  . GERD (gastroesophageal reflux disease)   . Glaucoma    sees [name]   . History of kidney stones    1970   . HYPERLIPIDEMIA 09/26/2006  . HYPERTENSION 04/13/2007  . JOINT EFFUSION, KNEE 06/15/2009  . Melanoma [facility])    sees [name]   . Numbness and tingling   . PROSTATE CANCER 10/05/2009   sees [name]  . Renal insufficiency    stage III [facility]     Past Surgical History:  Procedure Laterality Date  . ACROMIOPLASTY Right 05-06-14   per [name]   . BUNIONECTOMY     bilateral  . CATARACT EXTRACTION Left    per [name]   . COLONOSCOPY  07/10/2015   per [name], benign polyp, repeat in 10 yrs   . CORONARY STENT INTERVENTION N/A 01/17/2018   Procedure: CORONARY STENT INTERVENTION;  Surgeon: [name], MD;  Location: [place] [facility];  Service: Cardiovascular;  Laterality: N/A;  .  CYSTECTOMY     benign from hand  . GLAUCOMA SURGERY Bilateral 2014   . heart stent    . INSERTION OF MESH N/A 01/10/2013   Procedure: INSERTION OF MESH;  Surgeon: Earnstine Regal, MD;  Location: [place];  Service: General;  Laterality: N/A;  . KNEE ARTHROSCOPY Right Jan. 2013   right knee, per [name]   . KNEE ARTHROSCOPY Left 01-14-15   per [name]   . LEFT HEART CATH AND CORONARY ANGIOGRAPHY N/A 01/17/2018   Procedure: LEFT HEART CATH AND CORONARY ANGIOGRAPHY;  Surgeon: [name], MD;  Location: [place] [facility];  Service: Cardiovascular;  Laterality: N/A;  . LEFT HEART CATH AND CORONARY ANGIOGRAPHY N/A 04/06/2018   Procedure: LEFT HEART CATH AND CORONARY ANGIOGRAPHY;  Surgeon: [name], MD;  Location: [place] [facility];  Service: Cardiovascular;  Laterality: N/A;  . MELANOMA EXCISION  2012   per [name]  . PROSTATECTOMY     per [name]  . TONSILLECTOMY    . TOTAL KNEE ARTHROPLASTY Right 07/22/2019   Procedure: TOTAL KNEE ARTHROPLASTY;  Surgeon: Netta Cedars, MD;  Location: [facility];  Service: Orthopedics;  Laterality: Right;  . UMBILICAL HERNIA REPAIR  11/01/2011  Procedure: HERNIA REPAIR UMBILICAL ADULT;  Surgeon: Earnstine Regal, MD;  Location: [place];  Service: General;  Laterality: N/A;  Repair umbilical hernia with mesh patch  . VASECTOMY    . VENTRAL HERNIA REPAIR  01/10/2013   with mesh     [name]  . VENTRAL HERNIA REPAIR N/A 01/10/2013   Procedure: HERNIA REPAIR VENTRAL ADULT ;  Surgeon: Earnstine Regal, MD;  Location: [place];  Service: General;  Laterality: N/A;    There were no vitals filed for this visit.  Subjective Assessment - 08/12/19 0958    Subjective  COVID-19 screen performed prior to patient entering clinic.  Up an ddown a lot of stairs over the weekend.  Had trouble sleeping that night.    Pertinent History  2009 prostate CA, kidney disease, cardiac stent placment    Limitations  Standing;House hold  activities;Walking;Writing    Currently in Pain?  Yes    Pain Score  4     Pain Location  Knee    Pain Orientation  Right    Pain Descriptors / Indicators  Sore    Pain Type  Surgical pain    Pain Onset  1 to 4 weeks ago         [facility] PT Assessment - 08/12/19 0001      AROM   AROM Assessment Site  Knee    Right/Left Knee  Right    Right Knee Extension  3    Right Knee Flexion  120      PROM   Right Knee Extension  0    Right Knee Flexion  125                    [facility] Adult PT Treatment/Exercise - 08/12/19 0001      Exercises   Exercises  Knee/Hip      Knee/Hip Exercises: Aerobic   Recumbent Bike  15 minutes with patient able to move forward to seat 1.      Knee/Hip Exercises: Machines for Strengthening   Cybex Knee Extension  10# x 4 minutes.    Cybex Knee Flexion  30# x 4 minutes.      Modalities   Modalities  Health visitor Stimulation Location  Right knee.    Electrical Stimulation Action  Pre-mod.    Electrical Stimulation Parameters  80-150 Hz x 15 minutes.    Electrical Stimulation Goals  Pain      Vasopneumatic   Number Minutes Vasopneumatic   15 minutes    Vasopnuematic Location   --   Right knee.   Vasopneumatic Pressure  Medium               PT Short Term Goals - 08/06/19 1042      PT SHORT TERM GOAL #1   Title  Pt will be albe to perform 15 SLR on R LE and amb without RW.    Time  3    Period  Weeks    Status  Partially Met    Target Date  08/15/19        PT Long Term Goals - 08/06/19 1041      PT LONG TERM GOAL #1   Title  Ind with advanced HEP.    Time  8    Period  Weeks    Status  On-going      PT LONG TERM GOAL #2  Title  Pt will be able to amb with no device with step through gait pattern on community level surfaces.    Time  8    Period  Weeks    Status  On-going      PT LONG TERM GOAL #3   Title  Pt will improve his R knee flexion to  125 degrees in order to be able to improve functional mobility and gait.    Baseline  AROM: 15-70 degrees    Time  8    Period  Weeks    Status  On-going      PT LONG TERM GOAL #4   Title  Pt will be able to reach full knee extension of 0 degrees in R LE to imrpove gait pattern.    Baseline  15 degrees    Time  8    Period  Weeks    Status  On-going      PT LONG TERM GOAL #5   Title  Pt will be able to climb a flight of stairs with no rail with no pain reported.    Time  8    Period  Weeks    Status  On-going            Plan - 08/12/19 1017    Clinical Impression Statement  The patient is making excellent progress.  He moved forward to seat 1 today with active right knee flexion to 120 degress and 125 degrees passively.  He was able to perform resisted knee extension and ham curl machines without difficulty.    Personal Factors and Comorbidities  Comorbidity 3+    Comorbidities  see PMH    Examination-Activity Limitations  Squat;Stairs;Stand;Transfers    Examination-Participation Restrictions  Driving;Community Activity;Yard Work;Other    Stability/Clinical Decision Making  Stable/Uncomplicated       Patient will benefit from skilled therapeutic intervention in order to improve the following deficits and impairments:  Pain, Decreased activity tolerance, Impaired flexibility, Decreased strength, Decreased range of motion, Increased edema, Difficulty walking, Abnormal gait  Visit Diagnosis: Acute pain of right knee  Stiffness of right knee, not elsewhere classified  Difficulty in walking, not elsewhere classified  Localized edema     Problem List Patient Active Problem List   Diagnosis Date Noted  . Status post total knee replacement, right 07/22/2019  . Chronic pain of right knee 05/29/2019  . Numbness and tingling 05/29/2019  . CKD (chronic kidney disease) stage 3, GFR 30-59 ml/min 05/20/2019  . Paresthesia 01/17/2019  . Upper airway cough syndrome 09/19/2018   . DOE (dyspnea on exertion) 08/08/2018  . Abnormal cardiac CT angiography 01/17/2018  . Angina, class II ([place]) 01/17/2018  . Incisional hernia 08/11/2011  . PROSTATE CANCER 10/05/2009  . Coronary atherosclerosis 10/05/2009  . ABNORMAL EKG 10/07/2008  . Essential hypertension 04/13/2007  . DIVERTICULOSIS, COLON 04/13/2007  . Osteoarthritis 04/13/2007  . Hyperlipidemia with target LDL less than 70 09/26/2006    Sevin Langenbach, [place] MPT 08/12/2019, 10:31 AM  [facility] [address] [place], [place], [address] Phone: [phone]   Fax:  [phone]  Name: Ezekiel Menzer. MRN: [id number] Date of Birth: [birth year]

## 2019-08-14 ENCOUNTER — Ambulatory Visit: Payer: Medicare Other | Admitting: Physical Therapy

## 2019-08-14 ENCOUNTER — Encounter: Payer: Self-pay | Admitting: Physical Therapy

## 2019-08-14 ENCOUNTER — Other Ambulatory Visit: Payer: Self-pay

## 2019-08-14 DIAGNOSIS — R600 Localized edema: Secondary | ICD-10-CM

## 2019-08-14 DIAGNOSIS — M25661 Stiffness of right knee, not elsewhere classified: Secondary | ICD-10-CM

## 2019-08-14 DIAGNOSIS — M25561 Pain in right knee: Secondary | ICD-10-CM | POA: Diagnosis not present

## 2019-08-14 DIAGNOSIS — R262 Difficulty in walking, not elsewhere classified: Secondary | ICD-10-CM

## 2019-08-14 NOTE — Therapy (Signed)
[facility] [place], [place], [address] Phone: [phone]   Fax:  [phone]  Physical Therapy Treatment  Patient Details  Name: [name] [name]. MRN: [id number] Date of Birth: 05/08/1945 Referring Provider (PT): Esmond Plants, MD   Encounter Date: 08/14/2019  PT End of Session - 08/14/19 1046    Visit Number  9    Number of Visits  24    Date for PT Re-Evaluation  09/19/19    Authorization Type  KX modifier after visit 15, progress note every 10th visit    PT Start Time  0945    PT Stop Time  1032    PT Time Calculation (min)  47 min    Equipment Utilized During Treatment  Other (comment)   SPC   Activity Tolerance  Patient tolerated treatment well    Behavior During Therapy  [facility] for tasks assessed/performed       Past Medical History:  Diagnosis Date  . ABNORMAL EKG 10/07/2008  . ADVEF, DRUG/MED/BIOL SUBST, OTHER DRUG NOS 09/26/2006  . Agent orange exposure   . Allergy    seasonal  . Cataract    left eye-surgery 07-2015  . DEGENERATIVE JOINT DISEASE, MODERATE 04/13/2007  . DIVERTICULOSIS, COLON 04/13/2007  . ELEVATED PROSTATE SPECIFIC ANTIGEN 04/14/2007  . GERD (gastroesophageal reflux disease)   . Glaucoma    sees [name]   . History of kidney stones    1970   . HYPERLIPIDEMIA 09/26/2006  . HYPERTENSION 04/13/2007  . JOINT EFFUSION, KNEE 06/15/2009  . Melanoma [facility])    sees [name]   . Numbness and tingling   . PROSTATE CANCER 10/05/2009   sees [name]  . Renal insufficiency    stage III [place] [facility]     Past Surgical History:  Procedure Laterality Date  . ACROMIOPLASTY Right 05-06-14   per [name]   . BUNIONECTOMY     bilateral  . CATARACT EXTRACTION Left    per [name]   . COLONOSCOPY  07/10/2015   per [name], benign polyp, repeat in 10 yrs   . CORONARY STENT INTERVENTION N/A 01/17/2018   Procedure: CORONARY STENT INTERVENTION;  Surgeon: [name], MD;  Location: [place] [facility];  Service: Cardiovascular;  Laterality: N/A;  . CYSTECTOMY     benign from hand  . GLAUCOMA SURGERY Bilateral 2014   . heart stent    . INSERTION OF MESH N/A 01/10/2013   Procedure: INSERTION OF MESH;  Surgeon: Earnstine Regal, MD;  Location: [place];  Service: General;  Laterality: N/A;  . KNEE ARTHROSCOPY Right Jan. 2013   right knee, per [name]   . KNEE ARTHROSCOPY Left 01-14-15   per [name]   . LEFT HEART CATH AND CORONARY ANGIOGRAPHY N/A 01/17/2018   Procedure: LEFT HEART CATH AND CORONARY ANGIOGRAPHY;  Surgeon: [name], MD;  Location: [place] [facility];  Service: Cardiovascular;  Laterality: N/A;  . LEFT HEART CATH AND CORONARY ANGIOGRAPHY N/A 04/06/2018   Procedure: LEFT HEART CATH AND CORONARY ANGIOGRAPHY;  Surgeon: [name], MD;  Location: [place] [facility];  Service: Cardiovascular;  Laterality: N/A;  . MELANOMA EXCISION  2012   per [name]  . PROSTATECTOMY     per [name]  . TONSILLECTOMY    . TOTAL KNEE ARTHROPLASTY Right 07/22/2019   Procedure: TOTAL KNEE ARTHROPLASTY;  Surgeon: Netta Cedars, MD;  [facility];  Service: Orthopedics;  Laterality: Right;  . UMBILICAL HERNIA REPAIR  11/01/2011   Procedure: HERNIA REPAIR UMBILICAL ADULT;  Surgeon: Earnstine Regal, MD;  Location: [place];  Service: General;  Laterality: N/A;  Repair umbilical hernia with mesh patch  . VASECTOMY    . VENTRAL HERNIA REPAIR  01/10/2013   with mesh     [name]  . VENTRAL HERNIA REPAIR N/A 01/10/2013   Procedure: HERNIA REPAIR VENTRAL ADULT ;  Surgeon: Earnstine Regal, MD;  Location: [place];  Service: General;  Laterality: N/A;    There were no vitals filed for this visit.  Subjective Assessment - 08/14/19 1012    Subjective  COVID 19 screening performed on patient upon arrival. Patient reports he doesn't really use cane at home.    Pertinent History  2009 prostate CA, kidney disease, cardiac stent placment    Limitations   Standing;House hold activities;Walking;Writing    Currently in Pain?  Yes    Pain Score  2     Pain Location  Knee    Pain Orientation  Right    Pain Descriptors / Indicators  Sore    Pain Type  Surgical pain    Pain Onset  1 to 4 weeks ago    Pain Frequency  Intermittent         [facility] PT Assessment - 08/14/19 0001      Assessment   Medical Diagnosis  R TKA     Referring Provider (PT)  Esmond Plants, MD    Onset Date/Surgical Date  07/22/19    Hand Dominance  Left    Next MD Visit  08/28/2019    Prior Therapy  yes for rotator cuff      Precautions   Precautions  None      Restrictions   Weight Bearing Restrictions  No                    [facility] Adult PT Treatment/Exercise - 08/14/19 0001      Knee/Hip Exercises: Aerobic   Recumbent Bike  L3, seat 4 x15 min      Knee/Hip Exercises: Standing   Terminal Knee Extension  Strengthening;Right;2 sets;10 reps;Theraband    Theraband Level (Terminal Knee Extension)  Level 3 (Green)    Lateral Step Up  Right;2 sets;10 reps;Hand Hold: 2;Step Height: 6"    Forward Step Up  Right;2 sets;10 reps;Hand Hold: 2;Step Height: 6"    Rocker Board  4 minutes      Knee/Hip Exercises: Seated   Long Arc Quad  Strengthening;Right;2 sets;10 reps;Weights    Long Arc Quad Weight  3 lbs.    Long Arc Quad Limitations  VCs for ankle DF and hold at end range      Modalities   Modalities  Vasopneumatic      Vasopneumatic   Number Minutes Vasopneumatic   10 minutes    Vasopnuematic Location   Knee    Vasopneumatic Pressure  Medium    Vasopneumatic Temperature   34 for edema               PT Short Term Goals - 08/06/19 1042      PT SHORT TERM GOAL #1   Title  Pt will be albe to perform 15 SLR on R LE and amb without RW.    Time  3    Period  Weeks    Status  Partially Met    Target Date  08/15/19  PT Long Term Goals - 08/06/19 1041      PT LONG TERM GOAL #1   Title  Ind with advanced HEP.    Time  8     Period  Weeks    Status  On-going      PT LONG TERM GOAL #2   Title  Pt will be able to amb with no device with step through gait pattern on community level surfaces.    Time  8    Period  Weeks    Status  On-going      PT LONG TERM GOAL #3   Title  Pt will improve his R knee flexion to 125 degrees in order to be able to improve functional mobility and gait.    Baseline  AROM: 15-70 degrees    Time  8    Period  Weeks    Status  On-going      PT LONG TERM GOAL #4   Title  Pt will be able to reach full knee extension of 0 degrees in R LE to imrpove gait pattern.    Baseline  15 degrees    Time  8    Period  Weeks    Status  On-going      PT LONG TERM GOAL #5   Title  Pt will be able to climb a flight of stairs with no rail with no pain reported.    Time  8    Period  Weeks    Status  On-going            Plan - 08/14/19 1139    Clinical Impression Statement  Patient continues to progress well following TKR and progressed to more functional strengthening. Patient encouraged to utilize RLE more with ascending stairs without compensatory strategies. Great R quad activation noted during strengthening as well. Normal vasopnuematic response noted following removal of the modality.    Personal Factors and Comorbidities  Comorbidity 3+    Comorbidities  see PMH    Examination-Activity Limitations  Squat;Stairs;Stand;Transfers    Examination-Participation Restrictions  Driving;Community Activity;Yard Work;Other    Stability/Clinical Decision Making  Stable/Uncomplicated    Rehab Potential  Excellent    PT Frequency  3x / week    PT Duration  8 weeks    PT Treatment/Interventions  ADLs/Self Care Home Management;Cryotherapy;Electrical Stimulation;Functional mobility training;Stair training;Gait training;Therapeutic activities;Therapeutic exercise;Balance training;Neuromuscular re-education;Patient/family education;Manual techniques;Passive range of motion;Taping;Vasopneumatic Device     PT Next Visit Plan  Continue progressing functional ROM and strengthening.    Consulted and Agree with Plan of Care  Patient       Patient will benefit from skilled therapeutic intervention in order to improve the following deficits and impairments:  Pain, Decreased activity tolerance, Impaired flexibility, Decreased strength, Decreased range of motion, Increased edema, Difficulty walking, Abnormal gait  Visit Diagnosis: Acute pain of right knee  Stiffness of right knee, not elsewhere classified  Difficulty in walking, not elsewhere classified  Localized edema     Problem List Patient Active Problem List   Diagnosis Date Noted  . Status post total knee replacement, right 07/22/2019  . Chronic pain of right knee 05/29/2019  . Numbness and tingling 05/29/2019  . CKD (chronic kidney disease) stage 3, GFR 30-59 ml/min 05/20/2019  . Paresthesia 01/17/2019  . Upper airway cough syndrome 09/19/2018  . DOE (dyspnea on exertion) 08/08/2018  . Abnormal cardiac CT angiography 01/17/2018  . Angina, class II ([place]) 01/17/2018  . Incisional hernia 08/11/2011  .  PROSTATE CANCER 10/05/2009  . Coronary atherosclerosis 10/05/2009  . ABNORMAL EKG 10/07/2008  . Essential hypertension 04/13/2007  . DIVERTICULOSIS, COLON 04/13/2007  . Osteoarthritis 04/13/2007  . Hyperlipidemia with target LDL less than 70 09/26/2006    Standley Brooking, PTA 08/14/2019, 12:02 PM  [facility] [address] [place], [place], [address] Phone: [phone]   Fax:  [phone]  Name: Nimrod Wendt. MRN: [id number] Date of Birth: 1945/12/14

## 2019-08-16 ENCOUNTER — Other Ambulatory Visit: Payer: Self-pay

## 2019-08-16 ENCOUNTER — Ambulatory Visit: Payer: Medicare Other | Admitting: Physical Therapy

## 2019-08-16 DIAGNOSIS — M25561 Pain in right knee: Secondary | ICD-10-CM

## 2019-08-16 DIAGNOSIS — M25661 Stiffness of right knee, not elsewhere classified: Secondary | ICD-10-CM

## 2019-08-16 DIAGNOSIS — R600 Localized edema: Secondary | ICD-10-CM

## 2019-08-16 NOTE — Therapy (Signed)
[facility] [place], [place], [address] Phone: [phone]   Fax:  [phone]  Physical Therapy Treatment  Patient Details  Name: [name] [name]. MRN: [id number] Date of Birth: [birth year] Referring Provider (PT): Esmond Plants, MD   Encounter Date: 08/16/2019   PT End of Session - 08/16/19 1015    Visit Number 10    Number of Visits 24    Date for PT Re-Evaluation 09/19/19    Authorization Type KX modifier after visit 15, progress note every 10th visit    PT Start Time 0945    PT Stop Time 1029    PT Time Calculation (min) 44 min    Activity Tolerance Patient tolerated treatment well    Behavior During Therapy [facility] for tasks assessed/performed           Past Medical History:  Diagnosis Date  . ABNORMAL EKG 10/07/2008  . ADVEF, DRUG/MED/BIOL SUBST, OTHER DRUG NOS 09/26/2006  . Agent orange exposure   . Allergy    seasonal  . Cataract    left eye-surgery 07-2015  . DEGENERATIVE JOINT DISEASE, MODERATE 04/13/2007  . DIVERTICULOSIS, COLON 04/13/2007  . ELEVATED PROSTATE SPECIFIC ANTIGEN 04/14/2007  . GERD (gastroesophageal reflux disease)   . Glaucoma    sees [name]   . History of kidney stones    1970   . HYPERLIPIDEMIA 09/26/2006  . HYPERTENSION 04/13/2007  . JOINT EFFUSION, KNEE 06/15/2009  . Melanoma [facility])    sees [name]   . Numbness and tingling   . PROSTATE CANCER 10/05/2009   sees [name]  . Renal insufficiency    stage III [place] [facility]     Past Surgical History:  Procedure Laterality Date  . ACROMIOPLASTY Right 05-06-14   per [name]   . BUNIONECTOMY     bilateral  . CATARACT EXTRACTION Left    per [name]   . COLONOSCOPY  07/10/2015   per [name], benign polyp, repeat in 10 yrs   . CORONARY STENT INTERVENTION N/A 01/17/2018   Procedure: CORONARY STENT INTERVENTION;  Surgeon: [name], MD;  Location: [place] [facility];  Service: Cardiovascular;  Laterality: N/A;  .  CYSTECTOMY     benign from hand  . GLAUCOMA SURGERY Bilateral 2014   . heart stent    . INSERTION OF MESH N/A 01/10/2013   Procedure: INSERTION OF MESH;  Surgeon: Earnstine Regal, MD;  Location: [place];  Service: General;  Laterality: N/A;  . KNEE ARTHROSCOPY Right Jan. 2013   right knee, per [name]   . KNEE ARTHROSCOPY Left 01-14-15   per [name]   . LEFT HEART CATH AND CORONARY ANGIOGRAPHY N/A 01/17/2018   Procedure: LEFT HEART CATH AND CORONARY ANGIOGRAPHY;  Surgeon: [name], MD;  Location: [place] [facility];  Service: Cardiovascular;  Laterality: N/A;  . LEFT HEART CATH AND CORONARY ANGIOGRAPHY N/A 04/06/2018   Procedure: LEFT HEART CATH AND CORONARY ANGIOGRAPHY;  Surgeon: [name], MD;  Location: [place] [facility];  Service: Cardiovascular;  Laterality: N/A;  . MELANOMA EXCISION  2012   per [name]  . PROSTATECTOMY     per [name]  . TONSILLECTOMY    . TOTAL KNEE ARTHROPLASTY Right 07/22/2019   Procedure: TOTAL KNEE ARTHROPLASTY;  Surgeon: Netta Cedars, MD;  [facility];  Service: Orthopedics;  Laterality: Right;  . UMBILICAL HERNIA REPAIR  11/01/2011   Procedure: HERNIA REPAIR UMBILICAL  ADULT;  Surgeon: Earnstine Regal, MD;  Location: [place];  Service: General;  Laterality: N/A;  Repair umbilical hernia with mesh patch  . VASECTOMY    . VENTRAL HERNIA REPAIR  01/10/2013   with mesh     [name]  . VENTRAL HERNIA REPAIR N/A 01/10/2013   Procedure: HERNIA REPAIR VENTRAL ADULT ;  Surgeon: Earnstine Regal, MD;  Location: [place];  Service: General;  Laterality: N/A;    There were no vitals filed for this visit.   Subjective Assessment - 08/16/19 1015    Subjective COVID-19 screen performed prior to patient entering clinic.  Sore from doing a lot of stairs yesterday.    Patient is accompained by: Family member    Pertinent History 2009 prostate CA, kidney disease, cardiac stent placment    Limitations Standing;House hold  activities;Walking;Writing    Currently in Pain? Yes    Pain Descriptors / Indicators Sore    Pain Onset 1 to 4 weeks ago                             [facility] Adult PT Treatment/Exercise - 08/16/19 0001      Exercises   Exercises Knee/Hip      Knee/Hip Exercises: Aerobic   Recumbent Bike Level 2 x 18 minutes.      Modalities   Modalities Psychologist, educational Location --   Right knee.   Electrical Stimulation Action Preo-mod.    Electrical Stimulation Parameters 80-150 Hz x 15 minutes.    Electrical Stimulation Goals Edema;Pain      Vasopneumatic   Number Minutes Vasopneumatic  15 minutes    Vasopnuematic Location  --   Right knee.   Vasopneumatic Pressure Medium      Manual Therapy   Manual Therapy Passive ROM    Passive ROM In supine:  PROM into left knee extension x 6 minutes.                    PT Short Term Goals - 08/06/19 1042      PT SHORT TERM GOAL #1   Title Pt will be albe to perform 15 SLR on R LE and amb without RW.    Time 3    Period Weeks    Status Partially Met    Target Date 08/15/19             PT Long Term Goals - 08/16/19 1019      PT LONG TERM GOAL #1   Title Ind with advanced HEP.    Time 8    Period Weeks    Status On-going      PT LONG TERM GOAL #2   Title Pt will be able to amb with no device with step through gait pattern on community level surfaces.    Time 8    Period Weeks    Status On-going                  Patient will benefit from skilled therapeutic intervention in order to improve the following deficits and impairments:     Visit Diagnosis: Acute pain of right knee  Stiffness of right knee, not elsewhere classified  Localized edema     Problem List Patient Active Problem List   Diagnosis Date Noted  . Status post total knee replacement, right 07/22/2019  .  Chronic pain of right knee 05/29/2019  .  Numbness and tingling 05/29/2019  . CKD (chronic kidney disease) stage 3, GFR 30-59 ml/min 05/20/2019  . Paresthesia 01/17/2019  . Upper airway cough syndrome 09/19/2018  . DOE (dyspnea on exertion) 08/08/2018  . Abnormal cardiac CT angiography 01/17/2018  . Angina, class II ([place]) 01/17/2018  . Incisional hernia 08/11/2011  . PROSTATE CANCER 10/05/2009  . Coronary atherosclerosis 10/05/2009  . ABNORMAL EKG 10/07/2008  . Essential hypertension 04/13/2007  . DIVERTICULOSIS, COLON 04/13/2007  . Osteoarthritis 04/13/2007  . Hyperlipidemia with target LDL less than 70 09/26/2006   Progress Note Reporting Period 07/25/19 to 08/16/19  See note below for Objective Data and Assessment of Progress/Goals. Excellent progress toward goals.  Full right knee passive extension achieved today.     [name] [name], [name] [name] 08/16/2019, 10:29 AM  [facility] [address] [place], [place], [address] Phone: [phone]   Fax:  [phone]  Name: [name] [name]. MRN: [id number] Date of Birth: [birth year]

## 2019-08-19 ENCOUNTER — Other Ambulatory Visit: Payer: Self-pay

## 2019-08-19 ENCOUNTER — Ambulatory Visit: Payer: Medicare Other | Admitting: *Deleted

## 2019-08-19 DIAGNOSIS — M25561 Pain in right knee: Secondary | ICD-10-CM

## 2019-08-19 DIAGNOSIS — M25661 Stiffness of right knee, not elsewhere classified: Secondary | ICD-10-CM

## 2019-08-19 DIAGNOSIS — R600 Localized edema: Secondary | ICD-10-CM

## 2019-08-19 DIAGNOSIS — R262 Difficulty in walking, not elsewhere classified: Secondary | ICD-10-CM

## 2019-08-19 NOTE — Therapy (Signed)
[facility] [place], [place], [address] Phone: [phone]   Fax:  [phone]  Physical Therapy Treatment  Patient Details  Name: [name] [name]. MRN: [id number] Date of Birth: 09-29-1945 Referring Provider (PT): Esmond Plants, MD   Encounter Date: 08/19/2019   PT End of Session - 08/19/19 0959    Visit Number 11    Number of Visits 24    Authorization Type KX modifier after visit 15, progress note every 10th visit    PT Start Time 0945    PT Stop Time 1042    PT Time Calculation (min) 57 min           Past Medical History:  Diagnosis Date  . ABNORMAL EKG 10/07/2008  . ADVEF, DRUG/MED/BIOL SUBST, OTHER DRUG NOS 09/26/2006  . Agent orange exposure   . Allergy    seasonal  . Cataract    left eye-surgery 07-2015  . DEGENERATIVE JOINT DISEASE, MODERATE 04/13/2007  . DIVERTICULOSIS, COLON 04/13/2007  . ELEVATED PROSTATE SPECIFIC ANTIGEN 04/14/2007  . GERD (gastroesophageal reflux disease)   . Glaucoma    sees [name]   . History of kidney stones    1970   . HYPERLIPIDEMIA 09/26/2006  . HYPERTENSION 04/13/2007  . JOINT EFFUSION, KNEE 06/15/2009  . Melanoma [facility])    sees [name]   . Numbness and tingling   . PROSTATE CANCER 10/05/2009   sees [name]  . Renal insufficiency    stage III [place] [facility]     Past Surgical History:  Procedure Laterality Date  . ACROMIOPLASTY Right 05-06-14   per [name]   . BUNIONECTOMY     bilateral  . CATARACT EXTRACTION Left    per [name]   . COLONOSCOPY  07/10/2015   per [name], benign polyp, repeat in 10 yrs   . CORONARY STENT INTERVENTION N/A 01/17/2018   Procedure: CORONARY STENT INTERVENTION;  Surgeon: [name], MD;  Location: [place] [facility];  Service: Cardiovascular;  Laterality: N/A;  . CYSTECTOMY     benign from hand  . GLAUCOMA SURGERY Bilateral 2014   . heart stent    . INSERTION OF MESH N/A 01/10/2013   Procedure: INSERTION OF MESH;   Surgeon: Earnstine Regal, MD;  Location: [place];  Service: General;  Laterality: N/A;  . KNEE ARTHROSCOPY Right Jan. 2013   right knee, per [name]   . KNEE ARTHROSCOPY Left 01-14-15   per [name]   . LEFT HEART CATH AND CORONARY ANGIOGRAPHY N/A 01/17/2018   Procedure: LEFT HEART CATH AND CORONARY ANGIOGRAPHY;  Surgeon: [name], MD;  Location: [place] [facility];  Service: Cardiovascular;  Laterality: N/A;  . LEFT HEART CATH AND CORONARY ANGIOGRAPHY N/A 04/06/2018   Procedure: LEFT HEART CATH AND CORONARY ANGIOGRAPHY;  Surgeon: [name], MD;  Location: [place] [facility];  Service: Cardiovascular;  Laterality: N/A;  . MELANOMA EXCISION  2012   per [name]  . PROSTATECTOMY     per [name]  . TONSILLECTOMY    . TOTAL KNEE ARTHROPLASTY Right 07/22/2019   Procedure: TOTAL KNEE ARTHROPLASTY;  Surgeon: Netta Cedars, MD;  [facility];  Service: Orthopedics;  Laterality: Right;  . UMBILICAL HERNIA REPAIR  11/01/2011   Procedure: HERNIA REPAIR UMBILICAL ADULT;  Surgeon: Earnstine Regal, MD;  Location: [place];  Service: General;  Laterality: N/A;  Repair umbilical hernia with mesh patch  .  VASECTOMY    . VENTRAL HERNIA REPAIR  01/10/2013   with mesh     [name]  . VENTRAL HERNIA REPAIR N/A 01/10/2013   Procedure: HERNIA REPAIR VENTRAL ADULT ;  Surgeon: Earnstine Regal, MD;  Location: [place];  Service: General;  Laterality: N/A;    There were no vitals filed for this visit.   Subjective Assessment - 08/19/19 0958    Subjective COVID-19 screen performed prior to patient entering clinic.  Sore from last visit    Pertinent History 2009 prostate CA, kidney disease, cardiac stent placment    Limitations Standing;House hold activities;Walking;Writing    Currently in Pain? Yes    Pain Score 3     Pain Location Knee    Pain Orientation Right    Pain Descriptors / Indicators Sore    Pain Type Surgical pain    Pain Onset 1 to 4 weeks ago                              [facility] Adult PT Treatment/Exercise - 08/19/19 0001      Exercises   Exercises Knee/Hip      Knee/Hip Exercises: Aerobic   Recumbent Bike Level 2-3 x 15 minutes.      Knee/Hip Exercises: Standing   Lateral Step Up Right;10 reps;Hand Hold: 2;Step Height: 6";3 sets    Forward Step Up Right;10 reps;Hand Hold: 2;Step Height: 6";3 sets    Rocker Board 4 minutes   clf strething     Modalities   Modalities Electrical Stimulation;Vasopneumatic      Electrical Stimulation   Electrical Stimulation Location --   Right knee.   Electrical Stimulation Action premod    Printmaker Goals Edema;Pain      Vasopneumatic   Number Minutes Vasopneumatic  15 minutes    Vasopnuematic Location  Knee    Vasopneumatic Pressure Medium    Vasopneumatic Temperature  34 for edema      Manual Therapy   Manual Therapy Passive ROM    Passive ROM PROM for flexion/extension                    PT Short Term Goals - 08/06/19 1042      PT SHORT TERM GOAL #1   Title Pt will be albe to perform 15 SLR on R LE and amb without RW.    Time 3    Period Weeks    Status Partially Met    Target Date 08/15/19             PT Long Term Goals - 08/16/19 1019      PT LONG TERM GOAL #1   Title Ind with advanced HEP.    Time 8    Period Weeks    Status On-going      PT LONG TERM GOAL #2   Title Pt will be able to amb with no device with step through gait pattern on community level surfaces.    Time 8    Period Weeks    Status On-going                 Plan - 08/19/19 1306    Clinical Impression Statement Pt arrived today doing fairly well with mainly complaints of soreness RT knee from stretching. Rx focused on ROM, strength, as well as balance and was tolerated well. His cc was descending steps and was able to practice for eccentric  strengthening in // bars. Normal modality response today.    Personal Factors and Comorbidities Comorbidity 3+     Comorbidities see PMH    Examination-Activity Limitations Squat;Stairs;Stand;Transfers    Examination-Participation Restrictions Driving;Community Activity;Yard Work;Other    Stability/Clinical Decision Making Stable/Uncomplicated    Rehab Potential Excellent    PT Frequency 3x / week    PT Duration 8 weeks    PT Treatment/Interventions ADLs/Self Care Home Management;Cryotherapy;Electrical Stimulation;Functional mobility training;Stair training;Gait training;Therapeutic activities;Therapeutic exercise;Balance training;Neuromuscular re-education;Patient/family education;Manual techniques;Passive range of motion;Taping;Vasopneumatic Device    PT Next Visit Plan Continue progressing functional ROM and strengthening.           Patient will benefit from skilled therapeutic intervention in order to improve the following deficits and impairments:  Pain, Decreased activity tolerance, Impaired flexibility, Decreased strength, Decreased range of motion, Increased edema, Difficulty walking, Abnormal gait  Visit Diagnosis: Stiffness of right knee, not elsewhere classified  Acute pain of right knee  Localized edema  Difficulty in walking, not elsewhere classified     Problem List Patient Active Problem List   Diagnosis Date Noted  . Status post total knee replacement, right 07/22/2019  . Chronic pain of right knee 05/29/2019  . Numbness and tingling 05/29/2019  . CKD (chronic kidney disease) stage 3, GFR 30-59 ml/min 05/20/2019  . Paresthesia 01/17/2019  . Upper airway cough syndrome 09/19/2018  . DOE (dyspnea on exertion) 08/08/2018  . Abnormal cardiac CT angiography 01/17/2018  . Angina, class II ([place]) 01/17/2018  . Incisional hernia 08/11/2011  . PROSTATE CANCER 10/05/2009  . Coronary atherosclerosis 10/05/2009  . ABNORMAL EKG 10/07/2008  . Essential hypertension 04/13/2007  . DIVERTICULOSIS, COLON 04/13/2007  . Osteoarthritis 04/13/2007  . Hyperlipidemia with target LDL less  than 70 09/26/2006    Olga Bourbeau,CHRIS, PTA 08/19/2019, 1:16 PM  [facility] [place], [place], [address] Phone: [phone]   Fax:  [phone]  Name: [name] [name]. MRN: [id number] Date of Birth: 1945-10-15

## 2019-08-21 ENCOUNTER — Other Ambulatory Visit: Payer: Self-pay

## 2019-08-21 ENCOUNTER — Encounter: Payer: Self-pay | Admitting: Physical Therapy

## 2019-08-21 ENCOUNTER — Ambulatory Visit: Payer: Medicare Other | Admitting: Physical Therapy

## 2019-08-21 DIAGNOSIS — M25561 Pain in right knee: Secondary | ICD-10-CM | POA: Diagnosis not present

## 2019-08-21 DIAGNOSIS — R600 Localized edema: Secondary | ICD-10-CM

## 2019-08-21 DIAGNOSIS — M25661 Stiffness of right knee, not elsewhere classified: Secondary | ICD-10-CM

## 2019-08-21 DIAGNOSIS — R262 Difficulty in walking, not elsewhere classified: Secondary | ICD-10-CM

## 2019-08-21 NOTE — Therapy (Signed)
[facility] [place], [place], [address] Phone: [phone]   Fax:  [phone]  Physical Therapy Treatment  Patient Details  Name: [name] [name]. MRN: [id number] Date of Birth: 09/29/1945 Referring Provider (PT): Esmond Plants, MD   Encounter Date: 08/21/2019   PT End of Session - 08/21/19 0954    Visit Number 12    Number of Visits 24    Date for PT Re-Evaluation 09/19/19    Authorization Type KX modifier after visit 15, progress note every 10th visit    PT Start Time 0946    PT Stop Time 1030    PT Time Calculation (min) 44 min    Equipment Utilized During Treatment Other (comment)   SPC   Activity Tolerance Patient tolerated treatment well    Behavior During Therapy [facility] for tasks assessed/performed           Past Medical History:  Diagnosis Date  . ABNORMAL EKG 10/07/2008  . ADVEF, DRUG/MED/BIOL SUBST, OTHER DRUG NOS 09/26/2006  . Agent orange exposure   . Allergy    seasonal  . Cataract    left eye-surgery 07-2015  . DEGENERATIVE JOINT DISEASE, MODERATE 04/13/2007  . DIVERTICULOSIS, COLON 04/13/2007  . ELEVATED PROSTATE SPECIFIC ANTIGEN 04/14/2007  . GERD (gastroesophageal reflux disease)   . Glaucoma    sees [name]   . History of kidney stones    1970   . HYPERLIPIDEMIA 09/26/2006  . HYPERTENSION 04/13/2007  . JOINT EFFUSION, KNEE 06/15/2009  . Melanoma [facility])    sees [name]   . Numbness and tingling   . PROSTATE CANCER 10/05/2009   sees [name]  . Renal insufficiency    stage III [facility]     Past Surgical History:  Procedure Laterality Date  . ACROMIOPLASTY Right 05-06-14   per [name]   . BUNIONECTOMY     bilateral  . CATARACT EXTRACTION Left    per [name]   . COLONOSCOPY  07/10/2015   per [name], benign polyp, repeat in 10 yrs   . CORONARY STENT INTERVENTION N/A 01/17/2018   Procedure: CORONARY STENT INTERVENTION;  Surgeon: [name], MD;  Location: [place]  [facility];  Service: Cardiovascular;  Laterality: N/A;  . CYSTECTOMY     benign from hand  . GLAUCOMA SURGERY Bilateral 2014   . heart stent    . INSERTION OF MESH N/A 01/10/2013   Procedure: INSERTION OF MESH;  Surgeon: Earnstine Regal, MD;  Location: [place];  Service: General;  Laterality: N/A;  . KNEE ARTHROSCOPY Right Jan. 2013   right knee, per [name]   . KNEE ARTHROSCOPY Left 01-14-15   per [name]   . LEFT HEART CATH AND CORONARY ANGIOGRAPHY N/A 01/17/2018   Procedure: LEFT HEART CATH AND CORONARY ANGIOGRAPHY;  Surgeon: [name], MD;  Location: [place] [facility];  Service: Cardiovascular;  Laterality: N/A;  . LEFT HEART CATH AND CORONARY ANGIOGRAPHY N/A 04/06/2018   Procedure: LEFT HEART CATH AND CORONARY ANGIOGRAPHY;  Surgeon: [name], MD;  Location: [place] [facility];  Service: Cardiovascular;  Laterality: N/A;  . MELANOMA EXCISION  2012   per [name]  . PROSTATECTOMY     per [name]  . TONSILLECTOMY    . TOTAL KNEE ARTHROPLASTY Right 07/22/2019   Procedure: TOTAL KNEE ARTHROPLASTY;  Surgeon: Netta Cedars, MD;  Location: [facility];  Service: Orthopedics;  Laterality: Right;  .  UMBILICAL HERNIA REPAIR  11/01/2011   Procedure: HERNIA REPAIR UMBILICAL ADULT;  Surgeon: Earnstine Regal, MD;  Location: [place];  Service: General;  Laterality: N/A;  Repair umbilical hernia with mesh patch  . VASECTOMY    . VENTRAL HERNIA REPAIR  01/10/2013   with mesh     [name]  . VENTRAL HERNIA REPAIR N/A 01/10/2013   Procedure: HERNIA REPAIR VENTRAL ADULT ;  Surgeon: Earnstine Regal, MD;  Location: [place];  Service: General;  Laterality: N/A;    There were no vitals filed for this visit.   Subjective Assessment - 08/21/19 0953    Subjective COVID-19 screen performed prior to patient entering clinic. Reports some tightness of R knee upon arrival.    Pertinent History 2009 prostate CA, kidney disease, cardiac stent placment    Limitations Standing;House hold  activities;Walking;Writing    Currently in Pain? Yes    Pain Score 2     Pain Location Knee    Pain Orientation Right    Pain Descriptors / Indicators Tightness    Pain Type Surgical pain    Pain Onset 1 to 4 weeks ago    Pain Frequency Intermittent              [facility] PT Assessment - 08/21/19 0001      Assessment   Medical Diagnosis R TKA     Referring Provider (PT) Esmond Plants, MD    Onset Date/Surgical Date 07/22/19    Hand Dominance Left    Next MD Visit 08/28/2019    Prior Therapy yes for rotator cuff      Precautions   Precautions None      Restrictions   Weight Bearing Restrictions No                         [facility] Adult PT Treatment/Exercise - 08/21/19 0001      Ambulation/Gait   Stairs Yes    Stairs Assistance 6: Modified independent (Device/Increase time)    Stair Management Technique No rails;Alternating pattern;Forwards    Number of Stairs 4   x1 RT   Height of Stairs 6.5      Knee/Hip Exercises: Aerobic   Recumbent Bike L2, seat progression to 2 x15 min      Knee/Hip Exercises: Machines for Strengthening   Cybex Knee Extension 10# 3x10 reps    Cybex Knee Flexion 30# 3x10 reps      Knee/Hip Exercises: Standing   Terminal Knee Extension Strengthening;Right;15 reps;Theraband    Terminal Knee Extension Limitations Orange XTS      Knee/Hip Exercises: Supine   Straight Leg Raises AROM;Right;2 sets;10 reps    Straight Leg Raise with External Rotation AROM;Right;2 sets;10 reps      Knee/Hip Exercises: Sidelying   Hip ABduction AROM;Right;2 sets;10 reps      Modalities   Modalities Vasopneumatic      Vasopneumatic   Number Minutes Vasopneumatic  10 minutes    Vasopnuematic Location  Knee    Vasopneumatic Pressure Medium    Vasopneumatic Temperature  34 for edema                    PT Short Term Goals - 08/21/19 1035      PT SHORT TERM GOAL #1   Title Pt will be albe to perform 15 SLR on R LE and amb without RW.     Time 3    Period Weeks  Status Achieved    Target Date 08/15/19             PT Long Term Goals - 08/21/19 1035      PT LONG TERM GOAL #1   Title Ind with advanced HEP.    Time 8    Period Weeks    Status On-going      PT LONG TERM GOAL #2   Title Pt will be able to amb with no device with step through gait pattern on community level surfaces.    Time 8    Period Weeks    Status On-going      PT LONG TERM GOAL #3   Title Pt will improve his R knee flexion to 125 degrees in order to be able to improve functional mobility and gait.    Baseline AROM: 15-70 degrees    Time 8    Period Weeks    Status On-going      PT LONG TERM GOAL #4   Title Pt will be able to reach full knee extension of 0 degrees in R LE to imrpove gait pattern.    Baseline 15 degrees    Time 8    Period Weeks    Status On-going      PT LONG TERM GOAL #5   Title Pt will be able to climb a flight of stairs with no rail with no pain reported.    Time 8    Period Weeks    Status On-going                 Plan - 08/21/19 1043    Clinical Impression Statement Patient continues to progress well with all functional strengthening and ADLs for stair training. Patient states that he uses SPC minimally outside of his home. Great R quad activation noted during all strengthening today. Normal vasopnuematic response noted following removal of the modality.    Personal Factors and Comorbidities Comorbidity 3+    Comorbidities see PMH    Examination-Activity Limitations Squat;Stairs;Stand;Transfers    Examination-Participation Restrictions Driving;Community Activity;Yard Work;Other    Stability/Clinical Decision Making Stable/Uncomplicated    Rehab Potential Excellent    PT Frequency 3x / week    PT Duration 8 weeks    PT Treatment/Interventions ADLs/Self Care Home Management;Cryotherapy;Electrical Stimulation;Functional mobility training;Stair training;Gait training;Therapeutic activities;Therapeutic  exercise;Balance training;Neuromuscular re-education;Patient/family education;Manual techniques;Passive range of motion;Taping;Vasopneumatic Device    PT Next Visit Plan Continue progressing functional ROM and strengthening.    Consulted and Agree with Plan of Care Patient           Patient will benefit from skilled therapeutic intervention in order to improve the following deficits and impairments:  Pain, Decreased activity tolerance, Impaired flexibility, Decreased strength, Decreased range of motion, Increased edema, Difficulty walking, Abnormal gait  Visit Diagnosis: Stiffness of right knee, not elsewhere classified  Acute pain of right knee  Localized edema  Difficulty in walking, not elsewhere classified     Problem List Patient Active Problem List   Diagnosis Date Noted  . Status post total knee replacement, right 07/22/2019  . Chronic pain of right knee 05/29/2019  . Numbness and tingling 05/29/2019  . CKD (chronic kidney disease) stage 3, GFR 30-59 ml/min 05/20/2019  . Paresthesia 01/17/2019  . Upper airway cough syndrome 09/19/2018  . DOE (dyspnea on exertion) 08/08/2018  . Abnormal cardiac CT angiography 01/17/2018  . Angina, class II ([place]) 01/17/2018  . Incisional hernia 08/11/2011  . PROSTATE CANCER 10/05/2009  . Coronary  atherosclerosis 10/05/2009  . ABNORMAL EKG 10/07/2008  . Essential hypertension 04/13/2007  . DIVERTICULOSIS, COLON 04/13/2007  . Osteoarthritis 04/13/2007  . Hyperlipidemia with target LDL less than 70 09/26/2006    Standley Brooking, PTA 08/21/2019, 10:45 AM  [facility] [address] [place], [place], [address] Phone: [phone]   Fax:  [phone]  Name: Milik Gilreath. MRN: [id number] Date of Birth: 05-26-1945

## 2019-08-23 ENCOUNTER — Encounter: Payer: Self-pay | Admitting: *Deleted

## 2019-08-23 ENCOUNTER — Ambulatory Visit: Payer: Medicare Other | Admitting: *Deleted

## 2019-08-23 ENCOUNTER — Other Ambulatory Visit: Payer: Self-pay

## 2019-08-23 DIAGNOSIS — M25561 Pain in right knee: Secondary | ICD-10-CM

## 2019-08-23 DIAGNOSIS — R262 Difficulty in walking, not elsewhere classified: Secondary | ICD-10-CM

## 2019-08-23 DIAGNOSIS — M25661 Stiffness of right knee, not elsewhere classified: Secondary | ICD-10-CM

## 2019-08-23 DIAGNOSIS — R600 Localized edema: Secondary | ICD-10-CM

## 2019-08-23 NOTE — Therapy (Signed)
[facility] [place], [place], [address] Phone: [phone]   Fax:  [phone]  Physical Therapy Treatment  Patient Details  Name: [name] [name]. MRN: [id number] Date of Birth: 1945-07-15 Referring Provider (PT): Esmond Plants, MD   Encounter Date: 08/23/2019   PT End of Session - 08/23/19 0953    Visit Number 13    Number of Visits 24    Date for PT Re-Evaluation 09/19/19    Authorization Type KX modifier after visit 15, progress note every 10th visit    PT Start Time 0945    PT Stop Time 1043    PT Time Calculation (min) 58 min           Past Medical History:  Diagnosis Date  . ABNORMAL EKG 10/07/2008  . ADVEF, DRUG/MED/BIOL SUBST, OTHER DRUG NOS 09/26/2006  . Agent orange exposure   . Allergy    seasonal  . Cataract    left eye-surgery 07-2015  . DEGENERATIVE JOINT DISEASE, MODERATE 04/13/2007  . DIVERTICULOSIS, COLON 04/13/2007  . ELEVATED PROSTATE SPECIFIC ANTIGEN 04/14/2007  . GERD (gastroesophageal reflux disease)   . Glaucoma    sees [name]   . History of kidney stones    1970   . HYPERLIPIDEMIA 09/26/2006  . HYPERTENSION 04/13/2007  . JOINT EFFUSION, KNEE 06/15/2009  . Melanoma [facility])    sees [name]   . Numbness and tingling   . PROSTATE CANCER 10/05/2009   sees [name]  . Renal insufficiency    stage III [facility]     Past Surgical History:  Procedure Laterality Date  . ACROMIOPLASTY Right 05-06-14   per [name]   . BUNIONECTOMY     bilateral  . CATARACT EXTRACTION Left    per [name]   . COLONOSCOPY  07/10/2015   per [name], benign polyp, repeat in 10 yrs   . CORONARY STENT INTERVENTION N/A 01/17/2018   Procedure: CORONARY STENT INTERVENTION;  Surgeon: [name], MD;  Location: [place] [facility];  Service: Cardiovascular;  Laterality: N/A;  . CYSTECTOMY     benign from hand  . GLAUCOMA SURGERY Bilateral 2014   . heart stent    . INSERTION OF MESH N/A  01/10/2013   Procedure: INSERTION OF MESH;  Surgeon: Earnstine Regal, MD;  Location: [place];  Service: General;  Laterality: N/A;  . KNEE ARTHROSCOPY Right Jan. 2013   right knee, per [name]   . KNEE ARTHROSCOPY Left 01-14-15   per [name]   . LEFT HEART CATH AND CORONARY ANGIOGRAPHY N/A 01/17/2018   Procedure: LEFT HEART CATH AND CORONARY ANGIOGRAPHY;  Surgeon: [name], MD;  Location: [place] [facility];  Service: Cardiovascular;  Laterality: N/A;  . LEFT HEART CATH AND CORONARY ANGIOGRAPHY N/A 04/06/2018   Procedure: LEFT HEART CATH AND CORONARY ANGIOGRAPHY;  Surgeon: [name], MD;  Location: [place] [facility];  Service: Cardiovascular;  Laterality: N/A;  . MELANOMA EXCISION  2012   per [name]  . PROSTATECTOMY     per [name]  . TONSILLECTOMY    . TOTAL KNEE ARTHROPLASTY Right 07/22/2019   Procedure: TOTAL KNEE ARTHROPLASTY;  Surgeon: Netta Cedars, MD;  [facility]: [facility];  Service: Orthopedics;  Laterality: Right;  . UMBILICAL HERNIA REPAIR  11/01/2011   Procedure: HERNIA REPAIR UMBILICAL ADULT;  Surgeon: Earnstine Regal, MD;  Location: [place];  Service: General;  Laterality: N/A;  Repair umbilical hernia with mesh patch  . VASECTOMY    . VENTRAL HERNIA REPAIR  01/10/2013   with mesh     [name]  . VENTRAL HERNIA REPAIR N/A 01/10/2013   Procedure: HERNIA REPAIR VENTRAL ADULT ;  Surgeon: Earnstine Regal, MD;  Location: [place];  Service: General;  Laterality: N/A;    There were no vitals filed for this visit.   Subjective Assessment - 08/23/19 0948    Subjective COVID-19 screen performed prior to patient entering clinic. Reports some tightness of R knee upon arrival, but doing good. Using [facility] now. MD next week    Patient is accompained by: Family member    Pertinent History 2009 prostate CA, kidney disease, cardiac stent placment    Limitations Standing;House hold activities;Walking;Writing    Currently in Pain? Yes    Pain Score 2     Pain  Location Knee    Pain Orientation Right    Pain Descriptors / Indicators Tightness    Pain Type Surgical pain    Pain Onset 1 to 4 weeks ago                             [facility] Adult PT Treatment/Exercise - 08/23/19 0001      Exercises   Exercises Knee/Hip      Knee/Hip Exercises: Aerobic   Recumbent Bike L2, seat progression 4- 2 x15 min monitored for progression      Knee/Hip Exercises: Standing   Lateral Step Up Right;10 reps;Hand Hold: 2;Step Height: 6";3 sets    Forward Step Up Right;10 reps;Hand Hold: 2;Step Height: 6";3 sets    Rocker Board 4 minutes   clf strething   SLS x 3 mins      Modalities   Modalities Vasopneumatic      Vasopneumatic   Number Minutes Vasopneumatic  10 minutes    Vasopnuematic Location  Knee    Vasopneumatic Pressure Medium    Vasopneumatic Temperature  34 for edema      Manual Therapy   Manual Therapy Passive ROM    Passive ROM PROM for flexion/extension                    PT Short Term Goals - 08/21/19 1035      PT SHORT TERM GOAL #1   Title Pt will be albe to perform 15 SLR on R LE and amb without RW.    Time 3    Period Weeks    Status Achieved    Target Date 08/15/19             PT Long Term Goals - 08/21/19 1035      PT LONG TERM GOAL #1   Title Ind with advanced HEP.    Time 8    Period Weeks    Status On-going      PT LONG TERM GOAL #2   Title Pt will be able to amb with no device with step through gait pattern on community level surfaces.    Time 8    Period Weeks    Status On-going      PT LONG TERM GOAL #3   Title Pt will improve his R knee flexion to 125 degrees in order to be able to improve functional mobility and gait.    Baseline AROM: 15-70 degrees    Time 8    Period Weeks    Status On-going  PT LONG TERM GOAL #4   Title Pt will be able to reach full knee extension of 0 degrees in R LE to imrpove gait pattern.    Baseline 15 degrees    Time 8    Period Weeks     Status On-going      PT LONG TERM GOAL #5   Title Pt will be able to climb a flight of stairs with no rail with no pain reported.    Time 8    Period Weeks    Status On-going                 Plan - 08/23/19 1111    Clinical Impression Statement Pt arrived today doing fairly well and reports mainly stiffness/ tightness RT knee. Rx focused on ROM, strengthening as well as balance. ROM was great today with PROM 0-125 degrees    Personal Factors and Comorbidities Comorbidity 3+    Comorbidities see PMH    Examination-Activity Limitations Squat;Stairs;Stand;Transfers    Examination-Participation Restrictions Driving;Community Activity;Yard Work;Other    Rehab Potential Excellent    PT Frequency 3x / week    PT Duration 8 weeks    PT Treatment/Interventions ADLs/Self Care Home Management;Cryotherapy;Electrical Stimulation;Functional mobility training;Stair training;Gait training;Therapeutic activities;Therapeutic exercise;Balance training;Neuromuscular re-education;Patient/family education;Manual techniques;Passive range of motion;Taping;Vasopneumatic Device    PT Next Visit Plan Continue progressing functional ROM and strengthening.           Patient will benefit from skilled therapeutic intervention in order to improve the following deficits and impairments:  Pain, Decreased activity tolerance, Impaired flexibility, Decreased strength, Decreased range of motion, Increased edema, Difficulty walking, Abnormal gait  Visit Diagnosis: Stiffness of right knee, not elsewhere classified  Acute pain of right knee  Localized edema  Difficulty in walking, not elsewhere classified     Problem List Patient Active Problem List   Diagnosis Date Noted  . Status post total knee replacement, right 07/22/2019  . Chronic pain of right knee 05/29/2019  . Numbness and tingling 05/29/2019  . CKD (chronic kidney disease) stage 3, GFR 30-59 ml/min 05/20/2019  . Paresthesia 01/17/2019  .  Upper airway cough syndrome 09/19/2018  . DOE (dyspnea on exertion) 08/08/2018  . Abnormal cardiac CT angiography 01/17/2018  . Angina, class II ([place]) 01/17/2018  . Incisional hernia 08/11/2011  . PROSTATE CANCER 10/05/2009  . Coronary atherosclerosis 10/05/2009  . ABNORMAL EKG 10/07/2008  . Essential hypertension 04/13/2007  . DIVERTICULOSIS, COLON 04/13/2007  . Osteoarthritis 04/13/2007  . Hyperlipidemia with target LDL less than 70 09/26/2006    Refael Fulop,CHRIS, PTA 08/23/2019, 11:15 AM  [facility] [place], [place], [address] Phone: [phone]   Fax:  [phone]  Name: Kamarrion Stfort. MRN: [id number] Date of Birth: [birth year]

## 2019-08-26 ENCOUNTER — Ambulatory Visit: Payer: Medicare Other | Admitting: Physical Therapy

## 2019-08-26 ENCOUNTER — Other Ambulatory Visit: Payer: Self-pay

## 2019-08-26 DIAGNOSIS — M25561 Pain in right knee: Secondary | ICD-10-CM

## 2019-08-26 DIAGNOSIS — R262 Difficulty in walking, not elsewhere classified: Secondary | ICD-10-CM

## 2019-08-26 DIAGNOSIS — M25661 Stiffness of right knee, not elsewhere classified: Secondary | ICD-10-CM

## 2019-08-26 DIAGNOSIS — R600 Localized edema: Secondary | ICD-10-CM

## 2019-08-26 NOTE — Therapy (Signed)
[facility] [place], [place], [address] Phone: [phone]   Fax:  [phone]  Physical Therapy Treatment  Patient Details  Name: [name] [name]. MRN: [id number] Date of Birth: 05-20-1945 Referring Provider (PT): Esmond Plants, MD   Encounter Date: 08/26/2019   PT End of Session - 08/26/19 1030    Visit Number 14    Number of Visits 24    Date for PT Re-Evaluation 09/19/19    Authorization Type KX modifier after visit 15, progress note every 10th visit    PT Start Time 0945    PT Stop Time 1036    PT Time Calculation (min) 51 min    Activity Tolerance Patient tolerated treatment well    Behavior During Therapy [facility] for tasks assessed/performed           Past Medical History:  Diagnosis Date  . ABNORMAL EKG 10/07/2008  . ADVEF, DRUG/MED/BIOL SUBST, OTHER DRUG NOS 09/26/2006  . Agent orange exposure   . Allergy    seasonal  . Cataract    left eye-surgery 07-2015  . DEGENERATIVE JOINT DISEASE, MODERATE 04/13/2007  . DIVERTICULOSIS, COLON 04/13/2007  . ELEVATED PROSTATE SPECIFIC ANTIGEN 04/14/2007  . GERD (gastroesophageal reflux disease)   . Glaucoma    sees [name]   . History of kidney stones    1970   . HYPERLIPIDEMIA 09/26/2006  . HYPERTENSION 04/13/2007  . JOINT EFFUSION, KNEE 06/15/2009  . Melanoma [facility])    sees [name]   . Numbness and tingling   . PROSTATE CANCER 10/05/2009   sees [name]  . Renal insufficiency    stage III [facility]     Past Surgical History:  Procedure Laterality Date  . ACROMIOPLASTY Right 05-06-14   per [name]   . BUNIONECTOMY     bilateral  . CATARACT EXTRACTION Left    per [name]   . COLONOSCOPY  07/10/2015   per [name], benign polyp, repeat in 10 yrs   . CORONARY STENT INTERVENTION N/A 01/17/2018   Procedure: CORONARY STENT INTERVENTION;  Surgeon: [name], MD;  Location: [place] [facility];  Service: Cardiovascular;  Laterality: N/A;  .  CYSTECTOMY     benign from hand  . GLAUCOMA SURGERY Bilateral 2014   . heart stent    . INSERTION OF MESH N/A 01/10/2013   Procedure: INSERTION OF MESH;  Surgeon: Earnstine Regal, MD;  Location: [place];  Service: General;  Laterality: N/A;  . KNEE ARTHROSCOPY Right Jan. 2013   right knee, per [name]   . KNEE ARTHROSCOPY Left 01-14-15   per [name]   . LEFT HEART CATH AND CORONARY ANGIOGRAPHY N/A 01/17/2018   Procedure: LEFT HEART CATH AND CORONARY ANGIOGRAPHY;  Surgeon: [name], MD;  Location: [place] [facility];  Service: Cardiovascular;  Laterality: N/A;  . LEFT HEART CATH AND CORONARY ANGIOGRAPHY N/A 04/06/2018   Procedure: LEFT HEART CATH AND CORONARY ANGIOGRAPHY;  Surgeon: [name], MD;  Location: [place] [facility];  Service: Cardiovascular;  Laterality: N/A;  . MELANOMA EXCISION  2012   per [name]  . PROSTATECTOMY     per [name]  . TONSILLECTOMY    . TOTAL KNEE ARTHROPLASTY Right 07/22/2019   Procedure: TOTAL KNEE ARTHROPLASTY;  Surgeon: Netta Cedars, MD;  Location: [facility];  Service: Orthopedics;  Laterality: Right;  . UMBILICAL HERNIA REPAIR  11/01/2011   Procedure: HERNIA REPAIR UMBILICAL  ADULT;  Surgeon: Earnstine Regal, MD;  Location: [place];  Service: General;  Laterality: N/A;  Repair umbilical hernia with mesh patch  . VASECTOMY    . VENTRAL HERNIA REPAIR  01/10/2013   with mesh     [name]  . VENTRAL HERNIA REPAIR N/A 01/10/2013   Procedure: HERNIA REPAIR VENTRAL ADULT ;  Surgeon: Earnstine Regal, MD;  Location: [place];  Service: General;  Laterality: N/A;    There were no vitals filed for this visit.   Subjective Assessment - 08/26/19 0947    Subjective COVID-19 screen performed prior to patient entering clinic. Reports some tightness of R knee upon arrival and some soreness    Pertinent History 2009 prostate CA, kidney disease, cardiac stent placment    Limitations Standing;House hold activities;Walking;Writing    Currently  in Pain? Yes    Pain Score 2     Pain Location Knee    Pain Orientation Right    Pain Descriptors / Indicators Tightness;Sore    Pain Type Surgical pain    Pain Onset More than a month ago    Pain Frequency Intermittent    Aggravating Factors  prolong sitting or bening knee    Pain Relieving Factors rest, gentle movements ans meds              [facility] PT Assessment - 08/26/19 0001      AROM   AROM Assessment Site Knee    Right/Left Knee Right    Right Knee Extension -2    Right Knee Flexion 120      PROM   PROM Assessment Site Knee    Right/Left Knee Right    Right Knee Extension 0    Right Knee Flexion 125                         [facility] Adult PT Treatment/Exercise - 08/26/19 0001      Knee/Hip Exercises: Aerobic   Recumbent Bike L2, seat progression 4- 2 x15 min monitored for progression      Knee/Hip Exercises: Machines for Strengthening   Cybex Knee Extension 10# 2x15    Cybex Knee Flexion 30# 2x15      Knee/Hip Exercises: Supine   Bridges with [employer] Strengthening;Both;20 reps   green band     Knee/Hip Exercises: Sidelying   Hip ABduction Strengthening;Right;20 reps    Clams x20      Vasopneumatic   Number Minutes Vasopneumatic  10 minutes    Vasopnuematic Location  Knee    Vasopneumatic Pressure Medium    Vasopneumatic Temperature  34 for edema      Manual Therapy   Manual Therapy Passive ROM    Passive ROM PROM for flexion/extension and patella mobs to improve overall mobility                    PT Short Term Goals - 08/21/19 1035      PT SHORT TERM GOAL #1   Title Pt will be albe to perform 15 SLR on R LE and amb without RW.    Time 3    Period Weeks    Status Achieved    Target Date 08/15/19             PT Long Term Goals - 08/21/19 1035      PT LONG TERM GOAL #1   Title Ind with advanced HEP.    Time 8  Period Weeks    Status On-going      PT LONG TERM GOAL #2   Title Pt will be able to amb with no  device with step through gait pattern on community level surfaces.    Time 8    Period Weeks    Status On-going      PT LONG TERM GOAL #3   Title Pt will improve his R knee flexion to 125 degrees in order to be able to improve functional mobility and gait.    Baseline AROM: 15-70 degrees    Time 8    Period Weeks    Status On-going      PT LONG TERM GOAL #4   Title Pt will be able to reach full knee extension of 0 degrees in R LE to imrpove gait pattern.    Baseline 15 degrees    Time 8    Period Weeks    Status On-going      PT LONG TERM GOAL #5   Title Pt will be able to climb a flight of stairs with no rail with no pain reported.    Time 8    Period Weeks    Status On-going                 Plan - 08/26/19 1031    Clinical Impression Statement Patient tolerated treatment well today. Today patients ROM continues to improve with consistant ROM for flexion and ext. Patient progressed with supine PRE's today and may consider issue for HEP next treatment. Patient going to MD for F/U appt on thusday. Patient current goals progresisng.    Personal Factors and Comorbidities Comorbidity 3+    Comorbidities see PMH    Examination-Activity Limitations Squat;Stairs;Stand;Transfers    Examination-Participation Restrictions Driving;Community Activity;Yard Work;Other    Stability/Clinical Decision Making Stable/Uncomplicated    Rehab Potential Excellent    PT Frequency 3x / week    PT Duration 8 weeks    PT Treatment/Interventions ADLs/Self Care Home Management;Cryotherapy;Electrical Stimulation;Functional mobility training;Stair training;Gait training;Therapeutic activities;Therapeutic exercise;Balance training;Neuromuscular re-education;Patient/family education;Manual techniques;Passive range of motion;Taping;Vasopneumatic Device    PT Next Visit Plan Continue progressing functional ROM and strengthening. Issue HEP progression and MD note next visit for F/U appt. next treatment     Consulted and Agree with Plan of Care Patient           Patient will benefit from skilled therapeutic intervention in order to improve the following deficits and impairments:  Pain, Decreased activity tolerance, Impaired flexibility, Decreased strength, Decreased range of motion, Increased edema, Difficulty walking, Abnormal gait  Visit Diagnosis: Stiffness of right knee, not elsewhere classified  Acute pain of right knee  Localized edema  Difficulty in walking, not elsewhere classified     Problem List Patient Active Problem List   Diagnosis Date Noted  . Status post total knee replacement, right 07/22/2019  . Chronic pain of right knee 05/29/2019  . Numbness and tingling 05/29/2019  . CKD (chronic kidney disease) stage 3, GFR 30-59 ml/min 05/20/2019  . Paresthesia 01/17/2019  . Upper airway cough syndrome 09/19/2018  . DOE (dyspnea on exertion) 08/08/2018  . Abnormal cardiac CT angiography 01/17/2018  . Angina, class II ([place]) 01/17/2018  . Incisional hernia 08/11/2011  . PROSTATE CANCER 10/05/2009  . Coronary atherosclerosis 10/05/2009  . ABNORMAL EKG 10/07/2008  . Essential hypertension 04/13/2007  . DIVERTICULOSIS, COLON 04/13/2007  . Osteoarthritis 04/13/2007  . Hyperlipidemia with target LDL less than 70 09/26/2006    Gitty Osterlund  P, PTA 08/26/2019, 10:37 AM  [facility] [address] [place], [place], [address] Phone: [phone]   Fax:  [phone]  Name: [name] [name]. MRN: [id number] Date of Birth: 08-Aug-1945

## 2019-08-28 ENCOUNTER — Other Ambulatory Visit: Payer: Self-pay

## 2019-08-28 ENCOUNTER — Ambulatory Visit: Payer: Medicare Other | Admitting: Physical Therapy

## 2019-08-28 ENCOUNTER — Encounter: Payer: Self-pay | Admitting: Physical Therapy

## 2019-08-28 DIAGNOSIS — R262 Difficulty in walking, not elsewhere classified: Secondary | ICD-10-CM

## 2019-08-28 DIAGNOSIS — M25661 Stiffness of right knee, not elsewhere classified: Secondary | ICD-10-CM

## 2019-08-28 DIAGNOSIS — R600 Localized edema: Secondary | ICD-10-CM

## 2019-08-28 DIAGNOSIS — M25561 Pain in right knee: Secondary | ICD-10-CM | POA: Diagnosis not present

## 2019-08-28 NOTE — Therapy (Signed)
[facility] [place], [place], [address] Phone: [phone]   Fax:  [phone]  Physical Therapy Treatment  Patient Details  Name: [name] [name]. MRN: [id number] Date of Birth: [birth year] Referring Provider (PT): Esmond Plants, MD   Encounter Date: 08/28/2019   PT End of Session - 08/28/19 0956    Visit Number 15    Number of Visits 24    Date for PT Re-Evaluation 09/19/19    Authorization Type KX modifier after visit 15, progress note every 10th visit    Progress Note Due on Visit 10    PT Start Time 0947    PT Stop Time 1028    PT Time Calculation (min) 41 min    Activity Tolerance Patient tolerated treatment well    Behavior During Therapy [facility] for tasks assessed/performed           Past Medical History:  Diagnosis Date  . ABNORMAL EKG 10/07/2008  . ADVEF, DRUG/MED/BIOL SUBST, OTHER DRUG NOS 09/26/2006  . Agent orange exposure   . Allergy    seasonal  . Cataract    left eye-surgery 07-2015  . DEGENERATIVE JOINT DISEASE, MODERATE 04/13/2007  . DIVERTICULOSIS, COLON 04/13/2007  . ELEVATED PROSTATE SPECIFIC ANTIGEN 04/14/2007  . GERD (gastroesophageal reflux disease)   . Glaucoma    sees [name]   . History of kidney stones    1970   . HYPERLIPIDEMIA 09/26/2006  . HYPERTENSION 04/13/2007  . JOINT EFFUSION, KNEE 06/15/2009  . Melanoma [facility])    sees [name]   . Numbness and tingling   . PROSTATE CANCER 10/05/2009   sees [name]  . Renal insufficiency    stage III [facility]     Past Surgical History:  Procedure Laterality Date  . ACROMIOPLASTY Right 05-06-14   per [name]   . BUNIONECTOMY     bilateral  . CATARACT EXTRACTION Left    per [name]   . COLONOSCOPY  07/10/2015   per [name], benign polyp, repeat in 10 yrs   . CORONARY STENT INTERVENTION N/A 01/17/2018   Procedure: CORONARY STENT INTERVENTION;  Surgeon: [name], MD;  Location: [place] [facility];  Service:  Cardiovascular;  Laterality: N/A;  . CYSTECTOMY     benign from hand  . GLAUCOMA SURGERY Bilateral 2014   . heart stent    . INSERTION OF MESH N/A 01/10/2013   Procedure: INSERTION OF MESH;  Surgeon: Earnstine Regal, MD;  Location: [place];  Service: General;  Laterality: N/A;  . KNEE ARTHROSCOPY Right Jan. 2013   right knee, per [name]   . KNEE ARTHROSCOPY Left 01-14-15   per [name]   . LEFT HEART CATH AND CORONARY ANGIOGRAPHY N/A 01/17/2018   Procedure: LEFT HEART CATH AND CORONARY ANGIOGRAPHY;  Surgeon: [name], MD;  Location: [place] [facility];  Service: Cardiovascular;  Laterality: N/A;  . LEFT HEART CATH AND CORONARY ANGIOGRAPHY N/A 04/06/2018   Procedure: LEFT HEART CATH AND CORONARY ANGIOGRAPHY;  Surgeon: [name], MD;  Location: [place] [facility];  Service: Cardiovascular;  Laterality: N/A;  . MELANOMA EXCISION  2012   per [name]  . PROSTATECTOMY     per [name]  . TONSILLECTOMY    . TOTAL KNEE ARTHROPLASTY Right 07/22/2019   Procedure: TOTAL KNEE ARTHROPLASTY;  Surgeon: Netta Cedars, MD;  [facility];  Service: Orthopedics;  Laterality: Right;  . UMBILICAL HERNIA  REPAIR  11/01/2011   Procedure: HERNIA REPAIR UMBILICAL ADULT;  Surgeon: Earnstine Regal, MD;  Location: [place];  Service: General;  Laterality: N/A;  Repair umbilical hernia with mesh patch  . VASECTOMY    . VENTRAL HERNIA REPAIR  01/10/2013   with mesh     [name]  . VENTRAL HERNIA REPAIR N/A 01/10/2013   Procedure: HERNIA REPAIR VENTRAL ADULT ;  Surgeon: Earnstine Regal, MD;  Location: [place];  Service: General;  Laterality: N/A;    There were no vitals filed for this visit.   Subjective Assessment - 08/28/19 0955    Subjective COVID-19 screen performed prior to patient entering clinic. Reports that his [place] doctor could not believe how well his knee was doing. Reports less than normal tightness upon waking and does not use a cane anymore.    Pertinent History 2009  prostate CA, kidney disease, cardiac stent placment    Limitations Standing;House hold activities;Walking;Writing    Currently in Pain? No/denies              [facility] PT Assessment - 08/28/19 0001      Assessment   Medical Diagnosis R TKA     Referring Provider (PT) Esmond Plants, MD    Onset Date/Surgical Date 07/22/19    Hand Dominance Left    Next MD Visit 08/29/2019    Prior Therapy yes for rotator cuff      Precautions   Precautions None      Restrictions   Weight Bearing Restrictions No      Observation/Other Assessments-Edema    Edema Circumferential      Circumferential Edema   Circumferential - Right 39.6 cm    Circumferential - Left  38.4 cm      ROM / Strength   AROM / PROM / Strength AROM      AROM   Overall AROM  Within functional limits for tasks performed    AROM Assessment Site Knee    Right/Left Knee Right    Right Knee Extension 0    Right Knee Flexion 127      Strength   Overall Strength Within functional limits for tasks performed    Strength Assessment Site Hip;Knee    Right/Left Hip Right    Right Hip Flexion 4+/5    Right Hip ABduction 4+/5    Right/Left Knee Right    Right Knee Flexion 5/5    Right Knee Extension 4+/5                         [facility] Adult PT Treatment/Exercise - 08/28/19 0001      Ambulation/Gait   Stairs Yes    Stairs Assistance 7: Independent    Stair Management Technique No rails;Alternating pattern    Number of Stairs 4   x 1 RT   Height of Stairs 6.5      Knee/Hip Exercises: Aerobic   Recumbent Bike L4, seat 4-2 x10 min for ROM      Knee/Hip Exercises: Machines for Strengthening   Cybex Knee Extension 10# 3x10 reps    Cybex Knee Flexion 30# 3x10 reps      Knee/Hip Exercises: Standing   Step Down Right;20 reps;Hand Hold: 2;Step Height: 4"   heel dot   SLS RLE SLS x20 sec       Knee/Hip Exercises: Supine   Bridges with [employer] Strengthening;20 reps      Knee/Hip Exercises: Sidelying  Hip ABduction Strengthening;Right;20 reps      Modalities   Modalities Vasopneumatic      Vasopneumatic   Number Minutes Vasopneumatic  10 minutes    Vasopnuematic Location  Knee    Vasopneumatic Pressure Medium    Vasopneumatic Temperature  34 for edema                    PT Short Term Goals - 08/21/19 1035      PT SHORT TERM GOAL #1   Title Pt will be albe to perform 15 SLR on R LE and amb without RW.    Time 3    Period Weeks    Status Achieved    Target Date 08/15/19             PT Long Term Goals - 08/28/19 1002      PT LONG TERM GOAL #1   Title Ind with advanced HEP.    Time 8    Period Weeks    Status Achieved      PT LONG TERM GOAL #2   Title Pt will be able to amb with no device with step through gait pattern on community level surfaces.    Time 8    Period Weeks    Status Achieved      PT LONG TERM GOAL #3   Title Pt will improve his R knee flexion to 125 degrees in order to be able to improve functional mobility and gait.    Baseline AROM: 15-70 degrees    Time 8    Period Weeks    Status Achieved      PT LONG TERM GOAL #4   Title Pt will be able to reach full knee extension of 0 degrees in R LE to imrpove gait pattern.    Baseline 15 degrees    Time 8    Period Weeks    Status Achieved      PT LONG TERM GOAL #5   Title Pt will be able to climb a flight of stairs with no rail with no pain reported.    Time 8    Period Weeks    Status Achieved                 Plan - 08/28/19 1022    Clinical Impression Statement Patient presented in clinic with reports of only minimal tightness upon waking. Patient has progressed throughout therapy and is independent with all ADLs and activiites at home. Patient is hoping to return to hiking a familiar trail in the near future. Patient no longer uses AD during ambulation. Patient has been able to meet all LTGs set at evaluation and exceed R knee flexion goal. Minimal edema still present  surrounding R knee at this time. Patient is very active in daily life. Normal vasopnuematic response noted following observation of minimal R knee edema.    Personal Factors and Comorbidities Comorbidity 3+    Comorbidities see PMH    Examination-Activity Limitations Squat;Stairs;Stand;Transfers    Examination-Participation Restrictions Driving;Community Activity;Yard Work;Other    Stability/Clinical Decision Making Stable/Uncomplicated    Rehab Potential Excellent    PT Frequency 3x / week    PT Duration 8 weeks    PT Treatment/Interventions ADLs/Self Care Home Management;Cryotherapy;Electrical Stimulation;Functional mobility training;Stair training;Gait training;Therapeutic activities;Therapeutic exercise;Balance training;Neuromuscular re-education;Patient/family education;Manual techniques;Passive range of motion;Taping;Vasopneumatic Device    PT Next Visit Plan Continue or D/C per MD discretion.    Consulted and Agree with Plan of Care  Patient           Patient will benefit from skilled therapeutic intervention in order to improve the following deficits and impairments:  Pain, Decreased activity tolerance, Impaired flexibility, Decreased strength, Decreased range of motion, Increased edema, Difficulty walking, Abnormal gait  Visit Diagnosis: Stiffness of right knee, not elsewhere classified  Acute pain of right knee  Localized edema  Difficulty in walking, not elsewhere classified     Problem List Patient Active Problem List   Diagnosis Date Noted  . Status post total knee replacement, right 07/22/2019  . Chronic pain of right knee 05/29/2019  . Numbness and tingling 05/29/2019  . CKD (chronic kidney disease) stage 3, GFR 30-59 ml/min 05/20/2019  . Paresthesia 01/17/2019  . Upper airway cough syndrome 09/19/2018  . DOE (dyspnea on exertion) 08/08/2018  . Abnormal cardiac CT angiography 01/17/2018  . Angina, class II ([place]) 01/17/2018  . Incisional hernia 08/11/2011  .  PROSTATE CANCER 10/05/2009  . Coronary atherosclerosis 10/05/2009  . ABNORMAL EKG 10/07/2008  . Essential hypertension 04/13/2007  . DIVERTICULOSIS, COLON 04/13/2007  . Osteoarthritis 04/13/2007  . Hyperlipidemia with target LDL less than 70 09/26/2006    Standley Brooking, PTA 08/28/2019, 10:35 AM  [facility] [place], [place], [address] Phone: [phone]   Fax:  [phone]  Name: [name] [name]. MRN: [id number] Date of Birth: 07-28-1945

## 2019-08-30 ENCOUNTER — Ambulatory Visit: Payer: Medicare Other | Admitting: Physical Therapy

## 2019-08-30 ENCOUNTER — Other Ambulatory Visit: Payer: Self-pay

## 2019-08-30 ENCOUNTER — Encounter: Payer: Self-pay | Admitting: Physical Therapy

## 2019-08-30 DIAGNOSIS — M25561 Pain in right knee: Secondary | ICD-10-CM

## 2019-08-30 DIAGNOSIS — M25661 Stiffness of right knee, not elsewhere classified: Secondary | ICD-10-CM

## 2019-08-30 DIAGNOSIS — R262 Difficulty in walking, not elsewhere classified: Secondary | ICD-10-CM

## 2019-08-30 DIAGNOSIS — R600 Localized edema: Secondary | ICD-10-CM

## 2019-08-30 NOTE — Therapy (Signed)
[facility] [place], [place], [address] Phone: [phone]   Fax:  [phone]  Physical Therapy Treatment  Patient Details  Name: [name] [name]. MRN: [id number] Date of Birth: 01/20/1946 Referring Provider (PT): Esmond Plants, MD   Encounter Date: 08/30/2019   PT End of Session - 08/30/19 0950    Visit Number 16    Number of Visits 24    Date for PT Re-Evaluation 09/19/19    Authorization Type KX modifier after visit 15, progress note every 10th visit    PT Start Time 0946    PT Stop Time 1026    PT Time Calculation (min) 40 min    Activity Tolerance Patient tolerated treatment well    Behavior During Therapy [facility] for tasks assessed/performed           Past Medical History:  Diagnosis Date  . ABNORMAL EKG 10/07/2008  . ADVEF, DRUG/MED/BIOL SUBST, OTHER DRUG NOS 09/26/2006  . Agent orange exposure   . Allergy    seasonal  . Cataract    left eye-surgery 07-2015  . DEGENERATIVE JOINT DISEASE, MODERATE 04/13/2007  . DIVERTICULOSIS, COLON 04/13/2007  . ELEVATED PROSTATE SPECIFIC ANTIGEN 04/14/2007  . GERD (gastroesophageal reflux disease)   . Glaucoma    sees [name]   . History of kidney stones    1970   . HYPERLIPIDEMIA 09/26/2006  . HYPERTENSION 04/13/2007  . JOINT EFFUSION, KNEE 06/15/2009  . Melanoma [facility])    sees [name]   . Numbness and tingling   . PROSTATE CANCER 10/05/2009   sees [name]  . Renal insufficiency    stage III [facility]     Past Surgical History:  Procedure Laterality Date  . ACROMIOPLASTY Right 05-06-14   per [name]   . BUNIONECTOMY     bilateral  . CATARACT EXTRACTION Left    per [name]   . COLONOSCOPY  07/10/2015   per [name], benign polyp, repeat in 10 yrs   . CORONARY STENT INTERVENTION N/A 01/17/2018   Procedure: CORONARY STENT INTERVENTION;  Surgeon: [name], MD;  Location: [facility];  Service: Cardiovascular;  Laterality: N/A;  .  CYSTECTOMY     benign from hand  . GLAUCOMA SURGERY Bilateral 2014   . heart stent    . INSERTION OF MESH N/A 01/10/2013   Procedure: INSERTION OF MESH;  Surgeon: Earnstine Regal, MD;  Location: [place];  Service: General;  Laterality: N/A;  . KNEE ARTHROSCOPY Right Jan. 2013   right knee, per [name]   . KNEE ARTHROSCOPY Left 01-14-15   per [name]   . LEFT HEART CATH AND CORONARY ANGIOGRAPHY N/A 01/17/2018   Procedure: LEFT HEART CATH AND CORONARY ANGIOGRAPHY;  Surgeon: [name], MD;  Location: [place] [facility];  Service: Cardiovascular;  Laterality: N/A;  . LEFT HEART CATH AND CORONARY ANGIOGRAPHY N/A 04/06/2018   Procedure: LEFT HEART CATH AND CORONARY ANGIOGRAPHY;  Surgeon: [name], MD;  Location: [place] [facility];  Service: Cardiovascular;  Laterality: N/A;  . MELANOMA EXCISION  2012   per [name]  . PROSTATECTOMY     per [name]  . TONSILLECTOMY    . TOTAL KNEE ARTHROPLASTY Right 07/22/2019   Procedure: TOTAL KNEE ARTHROPLASTY;  Surgeon: Netta Cedars, MD;  Location: [facility];  Service: Orthopedics;  Laterality: Right;  . UMBILICAL HERNIA REPAIR  11/01/2011   Procedure: HERNIA REPAIR UMBILICAL  ADULT;  Surgeon: Earnstine Regal, MD;  Location: [facility];  Service: General;  Laterality: N/A;  Repair umbilical hernia with mesh patch  . VASECTOMY    . VENTRAL HERNIA REPAIR  01/10/2013   with mesh     [name]  . VENTRAL HERNIA REPAIR N/A 01/10/2013   Procedure: HERNIA REPAIR VENTRAL ADULT ;  Surgeon: Earnstine Regal, MD;  Location: [facility];  Service: General;  Laterality: N/A;    There were no vitals filed for this visit.   Subjective Assessment - 08/30/19 0949    Subjective COVID 19 screening performed on patient upon arrival. Patient received a glowing review at MD appointment and is to be discharged today. Minimal stiffness present upon arrival.    Pertinent History 2009 prostate CA, kidney disease, cardiac stent placment    Limitations  Standing;House hold activities;Walking;Writing    Currently in Pain? No/denies              [facility] PT Assessment - 08/30/19 0001      Assessment   Medical Diagnosis R TKA     Referring Provider (PT) Esmond Plants, MD    Onset Date/Surgical Date 07/22/19    Hand Dominance Left    Next MD Visit 10/2019    Prior Therapy yes for rotator cuff      Precautions   Precautions None      Restrictions   Weight Bearing Restrictions No                         [facility] Adult PT Treatment/Exercise - 08/30/19 0001      Knee/Hip Exercises: Aerobic   Recumbent Bike L4, seat 2-1 x15 min      Knee/Hip Exercises: Machines for Strengthening   Cybex Knee Extension 20# 3x10 reps    Cybex Knee Flexion 40# 3x10 reps    Cybex Leg Press 2 pl, seat 6, 2x10 reps      Knee/Hip Exercises: Standing   Forward Step Up Right;3 sets;Hand Hold: 2;Step Height: 8";10 reps   to 8" step, 14" step each   Step Down Right;20 reps;Hand Hold: 2;Step Height: 8"    Walking with Sports Cord BLUE XTS; backwards, B sides x10 reps each    Other Standing Knee Exercises DLS on BOSU intermittant UE support x5 min                    PT Short Term Goals - 08/21/19 1035      PT SHORT TERM GOAL #1   Title Pt will be albe to perform 15 SLR on R LE and amb without RW.    Time 3    Period Weeks    Status Achieved    Target Date 08/15/19             PT Long Term Goals - 08/28/19 1002      PT LONG TERM GOAL #1   Title Ind with advanced HEP.    Time 8    Period Weeks    Status Achieved      PT LONG TERM GOAL #2   Title Pt will be able to amb with no device with step through gait pattern on community level surfaces.    Time 8    Period Weeks    Status Achieved      PT LONG TERM GOAL #3   Title Pt will improve his R knee flexion to 125 degrees in order  to be able to improve functional mobility and gait.    Baseline AROM: 15-70 degrees    Time 8    Period Weeks    Status Achieved      PT  LONG TERM GOAL #4   Title Pt will be able to reach full knee extension of 0 degrees in R LE to imrpove gait pattern.    Baseline 15 degrees    Time 8    Period Weeks    Status Achieved      PT LONG TERM GOAL #5   Title Pt will be able to climb a flight of stairs with no rail with no pain reported.    Time 8    Period Weeks    Status Achieved                 Plan - 08/30/19 0954    Clinical Impression Statement Patient has exceeded expectations and progressed very well following a R TKR 5 weeks ago. Patient able to ambulate independently without AD or antalgic deviations. Patient's session progressed today as patient is very active recreationally with hiking and swimming. No complaints during any advanced step ups or step downs. Patient able to achieve all LTGs. Patient advised to ice and elevate PRN after recreational activities for 15-20 minutes to control edema.    Personal Factors and Comorbidities Comorbidity 3+    Comorbidities see PMH    Examination-Activity Limitations Squat;Stairs;Stand;Transfers    Examination-Participation Restrictions Driving;Community Activity;Yard Work;Other    Stability/Clinical Decision Making Stable/Uncomplicated    Rehab Potential Excellent    PT Frequency 3x / week    PT Duration 8 weeks    PT Treatment/Interventions ADLs/Self Care Home Management;Cryotherapy;Electrical Stimulation;Functional mobility training;Stair training;Gait training;Therapeutic activities;Therapeutic exercise;Balance training;Neuromuscular re-education;Patient/family education;Manual techniques;Passive range of motion;Taping;Vasopneumatic Device    PT Next Visit Plan D/C summary required.    Consulted and Agree with Plan of Care Patient           Patient will benefit from skilled therapeutic intervention in order to improve the following deficits and impairments:  Pain, Decreased activity tolerance, Impaired flexibility, Decreased strength, Decreased range of motion,  Increased edema, Difficulty walking, Abnormal gait  Visit Diagnosis: Stiffness of right knee, not elsewhere classified  Acute pain of right knee  Localized edema  Difficulty in walking, not elsewhere classified     Problem List Patient Active Problem List   Diagnosis Date Noted  . Status post total knee replacement, right 07/22/2019  . Chronic pain of right knee 05/29/2019  . Numbness and tingling 05/29/2019  . CKD (chronic kidney disease) stage 3, GFR 30-59 ml/min 05/20/2019  . Paresthesia 01/17/2019  . Upper airway cough syndrome 09/19/2018  . DOE (dyspnea on exertion) 08/08/2018  . Abnormal cardiac CT angiography 01/17/2018  . Angina, class II ([place]) 01/17/2018  . Incisional hernia 08/11/2011  . PROSTATE CANCER 10/05/2009  . Coronary atherosclerosis 10/05/2009  . ABNORMAL EKG 10/07/2008  . Essential hypertension 04/13/2007  . DIVERTICULOSIS, COLON 04/13/2007  . Osteoarthritis 04/13/2007  . Hyperlipidemia with target LDL less than 70 09/26/2006   Standley Brooking, PTA 08/30/19 10:50 AM   [facility] [place], [place], [address] Phone: [phone]   Fax:  [phone]  Name: Arsen Mangione. MRN: [id number] Date of Birth: 1945/12/21  PHYSICAL THERAPY DISCHARGE SUMMARY  Visits from Start of Care: 16.  Current functional level related to goals / functional outcomes: See above.   Remaining deficits: All goals met.  Education / Equipment: HEP. Plan: Patient agrees to discharge.  Patient goals were met. Patient is being discharged due to meeting the stated rehab goals.  ?????         Mali Applegate MPT

## 2019-09-17 ENCOUNTER — Ambulatory Visit: Payer: Medicare Other

## 2019-09-17 ENCOUNTER — Other Ambulatory Visit: Payer: Self-pay

## 2019-09-17 DIAGNOSIS — R739 Hyperglycemia, unspecified: Secondary | ICD-10-CM

## 2019-09-17 DIAGNOSIS — I10 Essential (primary) hypertension: Secondary | ICD-10-CM

## 2019-09-17 DIAGNOSIS — I209 Angina pectoris, unspecified: Secondary | ICD-10-CM

## 2019-09-17 DIAGNOSIS — N1831 Chronic kidney disease, stage 3a: Secondary | ICD-10-CM

## 2019-09-17 DIAGNOSIS — E785 Hyperlipidemia, unspecified: Secondary | ICD-10-CM

## 2019-09-17 DIAGNOSIS — I2510 Atherosclerotic heart disease of native coronary artery without angina pectoris: Secondary | ICD-10-CM

## 2019-09-17 NOTE — Chronic Care Management (AMB) (Signed)
Chronic Care Management Pharmacy  Name: [name] [name].  MRN: [id number] DOB: 11-May-1945  Initial Questions: 1. Have you seen any other providers since your last visit? NA 2. Any changes in your medicines or health? No   Chief Complaint/ HPI  [name] [name].,  74 y.o. , male presents for their Initial CCM visit with the clinical pharmacist via telephone due to COVID-19 Pandemic.  Patient reported undergoing a recent knee replacement and has completed physical therapy. He was placed on the treadmill and completed 0.25 mile. He aims to walk more and become more active.  Patient also notes being off diet since his surgery, but also aims to focus on this and plans to reduce amount of eggs he eats (he reported eating eggs everyday for breakfast).    PCP : [name], MD  Their chronic conditions include: HTN, HLD, Angina class II, coronary atherosclerosis, hyperglycemia, GERD, Leg cramps, glaucoma, CKD stage 3, s/p TKR  Office Visits: 05/20/2019- Alysia Penna, MD- patient presented for office visit for follow up. Patient referred to nephrology due to progression to CKD stage 3. Patient to obtain fasting labs: A1c, lipids, etc.   Consult Visit: 07/25/19- 08/30/19- Physical therapy- Patient completed physical therapy for stiffness of right knee, edema, difficulty walking.   07/22/2019- Hospital admission- Patient presented for right TKR.   05/01/2019- Cardiology- Jenkins Rouge, MD- Patient presented for office visit. No major interventions done. Patient cleared for right TKR.  04/18/2019- Neurology- Marcial Pacas, MD- Patient presented for office visit for intermittent numbness of left lower lip, bilateral fingertips and toes. To rule out peripheral neuropathy, EMG nerve conduction study to be done and MRI of cervical spine to rule out cervical myelopathy.   04/10/2019- Pulmonology- Christinia Gully, MD- Patient presented for office visit for DOE follow up. Patient referred back to [facility] for options  to keep systolic down with exertion. Patient to work on reconditioning exercises. Patient to follow up as needed.   Medications: Outpatient Encounter Medications as of 09/17/2019  Medication Sig Note  . bimatoprost (LUMIGAN) 0.01 % SOLN Place 1 drop into both eyes at bedtime.   . clopidogrel (PLAVIX) 75 MG tablet Take 1 tablet (75 mg total) by mouth daily with breakfast. (Patient taking differently: Take 75 mg by mouth daily. )   . Coenzyme Q10 (COQ-10) 100 MG CAPS Take 100 mg by mouth at bedtime.    . dorzolamide-timolol (COSOPT) 22.3-6.8 MG/ML ophthalmic solution Place 1 drop into both eyes 2 (two) times daily.    . famotidine (PEPCID) 20 MG tablet Take 20 mg by mouth at bedtime.    . fenofibrate 160 MG tablet Take 160 mg by mouth at bedtime.    . isosorbide mononitrate (IMDUR) 30 MG 24 hr tablet Take 1 tablet (30 mg total) by mouth daily. 07/22/2019: Took Imdur this morning  . magnesium oxide (MAG-OX) 400 MG tablet Take 400 mg by mouth every evening.    . pantoprazole (PROTONIX) 40 MG tablet Take 1 tablet (40 mg total) by mouth daily.   . rosuvastatin (CRESTOR) 20 MG tablet Take 20 mg by mouth every evening.    Marland Kitchen telmisartan (MICARDIS) 80 MG tablet Take 1 tablet (80 mg total) by mouth daily.   . methocarbamol (ROBAXIN) 500 MG tablet Take 1 tablet (500 mg total) by mouth every 8 (eight) hours as needed. (Patient not taking: Reported on 09/17/2019)   . Multiple Vitamins-Minerals (MULTIVITAMIN WITH MINERALS) tablet Take 1 tablet by mouth 2 (two) times a week.  Sundays and Wednesdays (Patient not taking: Reported on 09/17/2019)   . nitroGLYCERIN (NITROSTAT) 0.4 MG SL tablet Place 0.4 mg under the tongue every 5 (five) minutes as needed for chest pain. (Patient not taking: Reported on 09/17/2019) 07/22/2019: Not needed  . oxyCODONE-acetaminophen (PERCOCET) 5-325 MG tablet Take 1-2 tablets by mouth every 4 (four) hours as needed for severe pain. (Patient not taking: Reported on 09/17/2019)    No  facility-administered encounter medications on file as of 09/17/2019.     Current Diagnosis/Assessment:  Goals Addressed            This Visit's Progress   . Pharmacy Care Plan       CARE PLAN ENTRY (see longitudinal plan of care for additional care plan information)  Current Barriers:  . Chronic Disease Management support, education, and care coordination needs related to Hypertension, Hyperlipidemia, Chronic Kidney Disease, and coronary atherosclerosis, hyperglycemia, and angina class II   Hypertension BP Readings from Last 3 Encounters:  07/24/19 119/71  07/19/19 (!) 147/81  05/20/19 (!) 142/78   . Pharmacist Clinical Goal(s): o Over the next 90 days, patient will work with PharmD and providers to maintain BP goal <130/80 . Current regimen:  o telmisartan 26m, 1 tablet once daily  . Interventions: o Recommend: . Diet modifications. DASH diet:  following a diet emphasizing fruits and vegetables and low-fat dairy products along with whole grains, fish, poultry, and nuts. Reducing red meats and sugars.  . Exercising  . Reducing the amount of salt intake to <1505mper day.  . Recommend using a salt substitute to replace your salt if you need flavor.    . Weight reduction- We discussed losing 5-10% of body weight . Patient self care activities - Over the next 90 days, patient will: o Check BP daily, document, and provide at future appointments o Ensure daily salt intake < 1500 mg/day  Hyperlipidemia Lab Results  Component Value Date/Time   LDLCALC 90 05/20/2019 09:26 AM   LDLDIRECT 82.1 04/07/2011 08:07 AM   . Pharmacist Clinical Goal(s): o Over the next 90 days, patient will work with PharmD and providers to achieve LDL goal < 70 . Current regimen:  . Rosuvastatin 2014m1 tablet every evening . Fenofibrate 160m27m tablet at bedtime  . Interventions: o Recommend . Diet high in plant sterols (fruits/vegetables/nuts/whole grains/legumes) may reduce your cholesterol.   Encouraged increasing fiber to a daily intake of 10-25g/day  . Patient self care activities - Over the next 90 days, patient will: o Work on lifestyle modifications (diet and exercise).   Atherosclerosis of coronary artery . Pharmacist Clinical Goal(s) o Over the next 90 days, patient will work with PharmD and providers to prevent heart attacks/ strokes.  . Current regimen:  . Clopidogrel 75mg27mtablet with breakfast  . Rosuvastatin 20mg,45mablet every evening  . Coenzyme Q10, 100mg, 7mpsule at bedtime . Patient self care activities o Patient will continue current medications as directed by providers.   Angina class II . Pharmacist Clinical Goal(s) o Over the next 90 days, patient will work with PharmD and providers to control chest pain.  . Current regimen:  . Isosorbide mononitrate 30mg, 147mlet once daily . Nitroglycerin 0.4mg SL, 75mablet under tongue every five minutes as needed for chest pain . Interventions: o Recommend verifying expiration date of nitroglycerin and refilling as needed to maintain up-to-date supply.  . Patient self care activities o Patient will continue current medications as directed by providers.   Hyperglycemia  Lab Results  Component Value Date/Time   HGBA1C 6.2 05/20/2019 09:26 AM   HGBA1C 6.5 11/13/2018 10:50 AM   . Pharmacist Clinical Goal(s): o Over the next 90 days, patient will work with PharmD and providers to maintain A1c goal <6.5% . Current regimen:  o No medications o Diet and exercise . Interventions: o Recommend - We will focus on managing/monitoring CVD risk factors, including keeping blood pressure and lipids under goal. We discussed modifying lifestyle, including to participate in moderate physical activity (e.g., walking) at least 150 minutes per week. Discussed a Mediterranean eating plan with an emphasis on whole grains, legumes, nuts, fruits, and vegetables and minimal refined and processed foods.  . Patient self care  activities - Over the next 90 days, patient will: o Work on lifestyle interventions (diet and exercise).   Chronic kidney disease, stage 3 . Pharmacist Clinical Goal(s) o Over the next 90 days, patient will work with PharmD and providers to maintain kidney function stable.  . Patient self care activities - Over the next 90 days, patient will: o Continue follow up visits with nephrologist. Next visit: 10/14/19.   Medication management . Pharmacist Clinical Goal(s): o Over the next 90 days, patient will work with PharmD and providers to maintain optimal medication adherence . [facility] . Interventions o Comprehensive medication review performed. o Continue current medication management strategy . Patient self care activities - Over the next 90 days, patient will: o Take medications as prescribed o Report any questions or concerns to PharmD and/or provider(s)  Initial goal documentation       SDOH Interventions     Most Recent Value  SDOH Interventions  Financial Strain Interventions Intervention Not Indicated  [Patient reports obtaining medications from [place]  Transportation Interventions Intervention Not Indicated       Hypertension   Denies dizziness/ lightheadedness.   Office blood pressures are  BP Readings from Last 3 Encounters:  07/24/19 119/71  07/19/19 (!) 147/81  05/20/19 (!) 142/78   Patient has failed these meds in the past: lisinopril/ HCTZ   Patient checks BP at home daily  Patient home BP readings are ranging: 116/60- 130/80   Patient is controlled on:   telmisartan 40m, 1 tablet once daily   We discussed diet and exercise extensively  . Discussed diet modifications. DASH diet:  following a diet emphasizing fruits and vegetables and low-fat dairy products along with whole grains, fish, poultry, and nuts. Reducing red meats and sugars.  . Exercising  . Reducing the amount of salt intake to <1506mper day.   . Recommend using a salt substitute to replace your salt if you need flavor.    . Weight reduction- We discussed losing 5-10% of body weight  Plan Continue current medications and control with diet and exercise   Hyperlipidemia   LDL goal < 70   Lipid Panel     Component Value Date/Time   CHOL 166 05/20/2019 0926   TRIG 172.0 (H) 05/20/2019 0926   HDL 42.30 05/20/2019 0926   LDLCALC 90 05/20/2019 0926   LDLDIRECT 82.1 04/07/2011 0807    Hepatic Function Latest Ref Rng & Units 05/20/2019 11/13/2018 05/08/2018  Total Protein 6.0 - 8.3 g/dL 6.7 7.1 6.9  Albumin 3.5 - 5.2 g/dL 4.4 4.8 4.9  AST 0 - 37 U/L '17 21 15  ' ALT 0 - 53 U/L '18 22 16  ' Alk Phosphatase 39 - 117 U/L 34(L) 45 49  Total Bilirubin 0.2 - 1.2 mg/dL  0.6 0.7 0.6  Bilirubin, Direct 0.0 - 0.3 mg/dL 0.1 0.1 0.1    LDL: 82 ([facility])   The 10-year ASCVD risk score Mikey Bussing DC Jr., et al., 2013) is: N/A- patient has ASCVD (coronary atherosclerosis)  Patient has failed these meds in past: none   Patient is currently uncontrolled on the following medications:  . Rosuvastatin 37m, 1 tablet every evening . Fenofibrate 162m 1 tablet at bedtime   We discussed:   diet and exercise extensively   Modifying regimen to target LDLs (patient opted to work on diet and exercise since he is able to focus on this)  Plan Continue current medications and control with diet and exercise  Consider dose increase in statin or addition of ezetimibe. Readdress at follow up.   CAD (coronary atherosclerosis)    Patient reports having blockage and stents in the past.   Patient has failed these meds in past: none  Patient is currently controlled on the following medications:  . Clopidogrel 7568m1 tablet with breakfast  . Rosuvastatin 1m56m tablet every evening (high intensity statin)  . Coenzyme Q10, 100mg30mcapsule at bedtime (per patient for muscle relief due to statin- recommended by provider)  Plan Continue current  medications  Angina, class II  Denies chest pain, shortness of breath.  Denies needing to use nitroglycerin in the past.   Patient has failed these meds in past: none   Patient is currently controlled on the following medications:  . Isosorbide mononitrate 30mg,51mablet once daily . Nitroglycerin 0.4mg SL27m tablet under tongue every five minutes as needed for chest pain  We discussed: verifying expiration date of nitroglycerin and replacing supply as needed.  Plan Continue current medications    Hyperglycemia    Recent Relevant Labs: Lab Results  Component Value Date/Time   HGBA1C 6.2 05/20/2019 09:26 AM   HGBA1C 6.5 11/13/2018 10:50 AM    Per patient [facility] A1c:[place].2 Glu: 93  Patient has failed these meds in past: none  Patient is currently controlled on the following medications:   Lifestyle interventions  We discussed: diet and exercise extensively  We will focus on managing/monitoring CVD risk factors, including keeping blood pressure and lipids under goal.  We discussed modifying lifestyle, including to participate in moderate physical activity (e.g., walking) at least 150 minutes per week. Discussed a Mediterranean eating plan with an emphasis on whole grains, legumes, nuts, fruits, and vegetables and minimal refined and processed foods.   Plan Continue current medications and control with diet and exercise  GERD   Patient has failed these meds in past: omeprazole   Patient is currently controlled on the following medications:  . Pantoprazole 40mg, 165mlet once daily (takes as soon as he wakes up)  . Famotidine 1mg, 1 74met at bedtime   Plan Managed by GI (Daniel JOwens Lofflertinue current medications  Leg cramps    Patient is currently controlled on the following medications:   Magnesium oxide 400mg, 1 t105mt every evening  Plan Continue current medications  Glaucoma    Patient is currently controlled on the  following medications:  . bimatoprost 0.01%, 1 drop into both eyes at bedtime  . Dorzolamide- timolol 22.3-6.8 mg/ml, 1 drop into both eyes twice dailiy   Plan Continue current medications   S/p TKR   Completed/ not taking anymore:  - oxycodone/ acetaminophen 5-325mg - met45mrbamol 500mg   Pati54mis currently controlled on the following medications:  .  No medications   Plan Continue as is.   CKD, Stage 3   Kidney Function Lab Results  Component Value Date/Time   CREATININE 1.42 (H) 07/23/2019 03:06 AM   CREATININE 1.71 (H) 07/19/2019 10:38 AM   GFR 43.07 (L) 05/20/2019 09:26 AM   GFRNONAA 49 (L) 07/23/2019 03:06 AM   GFRAA 57 (L) 07/23/2019 03:06 AM   K 3.8 07/23/2019 03:06 AM   K 4.2 07/19/2019 10:38 AM   Plan Managed by nephrology. Patient to follow up on 10/14/19 for visit.  Continue to monitor and adjust medications as needed.    Vaccines   Reviewed and discussed patient's vaccination history.    Immunization History  Administered Date(s) Administered  . Fluad Quad(high Dose 65+) 11/13/2018  . Influenza Split 04/14/2011  . Influenza Whole 02/18/2009  . Influenza, High Dose Seasonal PF 01/01/2013, 11/06/2014, 11/06/2015, 11/08/2016, 11/07/2017  . Influenza,inj,Quad PF,6+ Mos 10/29/2013  . Moderna SARS-COVID-2 Vaccination 04/05/2019, 05/03/2019  . Pneumococcal Conjugate-13 06/26/2013  . Pneumococcal Polysaccharide-23 05/05/2015  . Td 05/10/2006  . Tdap 02/08/2018  . Zoster 06/13/2011    Plan Recommended patient receive shingles vaccine in pharmacy.   Medication Management  Patient organizes medications: patient reports using pill box (AM/PM)  [facility]  Adherence: limited refill history information; patient obtains medications from the [place].  Follow up Follow up visit with PharmD in 3 months.    Anson Crofts, PharmD Clinical Pharmacist [facility] [place] [phone]

## 2019-09-24 NOTE — Patient Instructions (Addendum)
Visit Information  Goals Addressed            This Visit's Progress   . Pharmacy Care Plan       CARE PLAN ENTRY (see longitudinal plan of care for additional care plan information)  Current Barriers:  . Chronic Disease Management support, education, and care coordination needs related to Hypertension, Hyperlipidemia, Chronic Kidney Disease, and coronary atherosclerosis, hyperglycemia, and angina class II   Hypertension BP Readings from Last 3 Encounters:  07/24/19 119/71  07/19/19 (!) 147/81  05/20/19 (!) 142/78   . Pharmacist Clinical Goal(s): o Over the next 90 days, patient will work with PharmD and providers to maintain BP goal <130/80 . Current regimen:  o telmisartan 80mg , 1 tablet once daily  . Interventions: o Recommend: . Diet modifications. DASH diet:  following a diet emphasizing fruits and vegetables and low-fat dairy products along with whole grains, fish, poultry, and nuts. Reducing red meats and sugars.  . Exercising  . Reducing the amount of salt intake to 1500mg /per day.  . Recommend using a salt substitute to replace your salt if you need flavor.    . Weight reduction- We discussed losing 5-10% of body weight . Patient self care activities - Over the next 90 days, patient will: o Check BP daily, document, and provide at future appointments o Ensure daily salt intake < 1500 mg/day  Hyperlipidemia Lab Results  Component Value Date/Time   LDLCALC 90 05/20/2019 09:26 AM   LDLDIRECT 82.1 04/07/2011 08:07 AM   . Pharmacist Clinical Goal(s): o Over the next 90 days, patient will work with PharmD and providers to achieve LDL goal < 70 . Current regimen:  . Rosuvastatin 20mg , 1 tablet every evening . Fenofibrate 160mg , 1 tablet at bedtime  . Interventions: o Recommend . Diet high in plant sterols (fruits/vegetables/nuts/whole grains/legumes) may reduce your cholesterol.  Encouraged increasing fiber to a daily intake of 10-25g/day  . Patient self care  activities - Over the next 90 days, patient will: o Work on lifestyle modifications (diet and exercise).   Atherosclerosis of coronary artery . Pharmacist Clinical Goal(s) o Over the next 90 days, patient will work with PharmD and providers to prevent heart attacks/ strokes.  . Current regimen:  . Clopidogrel 75mg , 1 tablet with breakfast  . Rosuvastatin 20mg , 1 tablet every evening  . Coenzyme Q10, 100mg , 1 capsule at bedtime . Patient self care activities o Patient will continue current medications as directed by providers.   Angina class II . Pharmacist Clinical Goal(s) o Over the next 90 days, patient will work with PharmD and providers to control chest pain.  . Current regimen:  . Isosorbide mononitrate 30mg , 1 tablet once daily . Nitroglycerin 0.4mg  SL, 1 tablet under tongue every five minutes as needed for chest pain . Interventions: o Recommend verifying expiration date of nitroglycerin and refilling as needed to maintain up-to-date supply.  . Patient self care activities o Patient will continue current medications as directed by providers.   Hyperglycemia Lab Results  Component Value Date/Time   HGBA1C 6.2 05/20/2019 09:26 AM   HGBA1C 6.5 11/13/2018 10:50 AM   . Pharmacist Clinical Goal(s): o Over the next 90 days, patient will work with PharmD and providers to maintain A1c goal <6.5% . Current regimen:  o No medications o Diet and exercise . Interventions: o Recommend - We will focus on managing/monitoring CVD risk factors, including keeping blood pressure and lipids under goal. We discussed modifying lifestyle, including to participate in moderate physical  activity (e.g., walking) at least 150 minutes per week. Discussed a Mediterranean eating plan with an emphasis on whole grains, legumes, nuts, fruits, and vegetables and minimal refined and processed foods.  . Patient self care activities - Over the next 90 days, patient will: o Work on lifestyle interventions  (diet and exercise).   Chronic kidney disease, stage 3 . Pharmacist Clinical Goal(s) o Over the next 90 days, patient will work with PharmD and providers to maintain kidney function stable.  . Patient self care activities - Over the next 90 days, patient will: o Continue follow up visits with nephrologist. Next visit: 10/14/19.   Medication management . Pharmacist Clinical Goal(s): o Over the next 90 days, patient will work with PharmD and providers to maintain optimal medication adherence . [facility] . Interventions o Comprehensive medication review performed. o Continue current medication management strategy . Patient self care activities - Over the next 90 days, patient will: o Take medications as prescribed o Report any questions or concerns to PharmD and/or provider(s)  Initial goal documentation        [name] [name] was given information about Chronic Care Management services today including:  1. CCM service includes personalized support from designated clinical staff supervised by his physician, including individualized plan of care and coordination with other care providers 2. 24/7 contact phone numbers for assistance for urgent and routine care needs. 3. Standard insurance, coinsurance, copays and deductibles apply for chronic care management only during months in which we provide at least 20 minutes of these services. Most insurances cover these services at 100%, however patients may be responsible for any copay, coinsurance and/or deductible if applicable. This service may help you avoid the need for more expensive face-to-face services. 4. Only one practitioner may furnish and bill the service in a calendar month. 5. The patient may stop CCM services at any time (effective at the end of the month) by phone call to the office staff.  Patient agreed to services and verbal consent obtained.   The patient verbalized understanding of instructions  provided today and agreed to receive a mailed copy of patient instruction and/or educational materials. Telephone follow up appointment with pharmacy team member scheduled for: 12/19/2019  Anson Crofts, PharmD Clinical Pharmacist [facility] [place] [phone]    Preventing Type 2 Diabetes Mellitus Type 2 diabetes (type 2 diabetes mellitus) is a long-term (chronic) disease that affects blood sugar (glucose) levels. Normally, a hormone called insulin allows glucose to enter cells in the body. The cells use glucose for energy. In type 2 diabetes, one or both of these problems may be present:  The body does not make enough insulin.  The body does not respond properly to insulin that it makes (insulin resistance). Insulin resistance or lack of insulin causes excess glucose to build up in the blood instead of going into cells. As a result, high blood glucose (hyperglycemia) develops, which can cause many complications. Being overweight or obese and having an inactive (sedentary) lifestyle can increase your risk for diabetes. Type 2 diabetes can be delayed or prevented by making certain nutrition and lifestyle changes. What nutrition changes can be made?   Eat healthy meals and snacks regularly. Keep a healthy snack with you for when you get hungry between meals, such as fruit or a handful of nuts.  Eat lean meats and proteins that are low in saturated fats, such as chicken, fish, egg whites, and beans. Avoid processed meats.  Eat plenty of fruits and vegetables and plenty of grains that have not been processed (whole grains). It is recommended that you eat: ? 1?2 cups of fruit every day. ? 2?3 cups of vegetables every day. ? 6?8 oz of whole grains every day, such as oats, whole wheat, bulgur, brown rice, quinoa, and millet.  Eat low-fat dairy products, such as milk, yogurt, and cheese.  Eat foods that contain healthy fats, such as nuts, avocado, olive oil, and  canola oil.  Drink water throughout the day. Avoid drinks that contain added sugar, such as soda or sweet tea.  Follow instructions from your health care provider about specific eating or drinking restrictions.  Control how much food you eat at a time (portion size). ? Check food labels to find out the serving sizes of foods. ? Use a kitchen scale to weigh amounts of foods.  Saute or steam food instead of frying it. Cook with water or broth instead of oils or butter.  Limit your intake of: ? Salt (sodium). Have no more than 1 tsp (2,400 mg) of sodium a day. If you have heart disease or high blood pressure, have less than ? tsp (1,500 mg) of sodium a day. ? Saturated fat. This is fat that is solid at room temperature, such as butter or fat on meat. What lifestyle changes can be made? Activity   Do moderate-intensity physical activity for at least 30 minutes on at least 5 days of the week, or as much as told by your health care provider.  Ask your health care provider what activities are safe for you. A mix of physical activities may be best, such as walking, swimming, cycling, and strength training.  Try to add physical activity into your day. For example: ? Park in spots that are farther away than usual, so that you walk more. For example, park in a far corner of the parking lot when you go to the office or the grocery store. ? Take a walk during your lunch break. ? Use stairs instead of elevators or escalators. Weight Loss  Lose weight as directed. Your health care provider can determine how much weight loss is best for you and can help you lose weight safely.  If you are overweight or obese, you may be instructed to lose at least 5?7 % of your body weight. Alcohol and Tobacco   Limit alcohol intake to no more than 1 drink a day for nonpregnant women and 2 drinks a day for men. One drink equals 12 oz of beer, 5 oz of wine, or 1 oz of hard liquor.  Do not use any tobacco  products, such as cigarettes, chewing tobacco, and e-cigarettes. If you need help quitting, ask your health care provider. Work With [place] Provider  Have your blood glucose tested regularly, as told by your health care provider.  Discuss your risk factors and how you can reduce your risk for diabetes.  Get screening tests as told by your health care provider. You may have screening tests regularly, especially if you have certain risk factors for type 2 diabetes.  Make an appointment with a diet and nutrition specialist (registered dietitian). A registered dietitian can help you make a healthy eating plan and can help you understand portion sizes and food labels. Why are these changes important?  It is possible to prevent or delay type 2 diabetes and related health problems by making lifestyle and nutrition changes.  It can be difficult to recognize  signs of type 2 diabetes. The best way to avoid possible damage to your body is to take actions to prevent the disease before you develop symptoms. What can happen if changes are not made?  Your blood glucose levels may keep increasing. Having high blood glucose for a long time is dangerous. Too much glucose in your blood can damage your blood vessels, heart, kidneys, nerves, and eyes.  You may develop prediabetes or type 2 diabetes. Type 2 diabetes can lead to many chronic health problems and complications, such as: ? Heart disease. ? Stroke. ? Blindness. ? Kidney disease. ? Depression. ? Poor circulation in the feet and legs, which could lead to surgical removal (amputation) in severe cases. Where to find support  Ask your health care provider to recommend a registered dietitian, diabetes educator, or weight loss program.  Look for local or online weight loss groups.  Join a gym, fitness club, or outdoor activity group, such as a walking club. Where to find more information To learn more about diabetes and diabetes  prevention, visit:  American Diabetes Association (ADA): www.diabetes.[employer] of Diabetes and Digestive and Kidney Diseases: FindSpin.nl To learn more about healthy eating, visit:  The U.S. Department of Agriculture Scientist, research (physical sciences)), Choose My Plate: http://wiley-williams.com/  Office of Disease Prevention and Health Promotion (ODPHP), Dietary Guidelines: SurferLive.at Summary  You can reduce your risk for type 2 diabetes by increasing your physical activity, eating healthy foods, and losing weight as directed.  Talk with your health care provider about your risk for type 2 diabetes. Ask about any blood tests or screening tests that you need to have. This information is not intended to replace advice given to you by your health care provider. Make sure you discuss any questions you have with your health care provider. Document Revised: 06/15/2018 Document Reviewed: 04/14/2015 Elsevier Patient Education  [place].

## 2019-10-14 DIAGNOSIS — N1832 Chronic kidney disease, stage 3b: Secondary | ICD-10-CM | POA: Diagnosis not present

## 2019-10-14 DIAGNOSIS — N281 Cyst of kidney, acquired: Secondary | ICD-10-CM | POA: Diagnosis not present

## 2019-10-14 DIAGNOSIS — E785 Hyperlipidemia, unspecified: Secondary | ICD-10-CM | POA: Diagnosis not present

## 2019-10-14 DIAGNOSIS — C61 Malignant neoplasm of prostate: Secondary | ICD-10-CM | POA: Diagnosis not present

## 2019-10-14 DIAGNOSIS — M1710 Unilateral primary osteoarthritis, unspecified knee: Secondary | ICD-10-CM | POA: Diagnosis not present

## 2019-10-14 DIAGNOSIS — I2510 Atherosclerotic heart disease of native coronary artery without angina pectoris: Secondary | ICD-10-CM | POA: Diagnosis not present

## 2019-10-14 DIAGNOSIS — I129 Hypertensive chronic kidney disease with stage 1 through stage 4 chronic kidney disease, or unspecified chronic kidney disease: Secondary | ICD-10-CM | POA: Diagnosis not present

## 2019-10-22 DIAGNOSIS — Z9889 Other specified postprocedural states: Secondary | ICD-10-CM | POA: Diagnosis not present

## 2019-10-22 DIAGNOSIS — Z961 Presence of intraocular lens: Secondary | ICD-10-CM | POA: Diagnosis not present

## 2019-10-22 DIAGNOSIS — H401131 Primary open-angle glaucoma, bilateral, mild stage: Secondary | ICD-10-CM | POA: Diagnosis not present

## 2019-11-13 ENCOUNTER — Other Ambulatory Visit: Payer: Self-pay

## 2019-11-14 ENCOUNTER — Encounter: Payer: Self-pay | Admitting: Family Medicine

## 2019-11-14 ENCOUNTER — Ambulatory Visit (INDEPENDENT_AMBULATORY_CARE_PROVIDER_SITE_OTHER): Payer: Medicare Other | Admitting: Family Medicine

## 2019-11-14 VITALS — BP 126/80 | HR 68 | Temp 98.4°F | Ht 66.0 in | Wt 197.4 lb

## 2019-11-14 DIAGNOSIS — M8949 Other hypertrophic osteoarthropathy, multiple sites: Secondary | ICD-10-CM

## 2019-11-14 DIAGNOSIS — I209 Angina pectoris, unspecified: Secondary | ICD-10-CM

## 2019-11-14 DIAGNOSIS — I10 Essential (primary) hypertension: Secondary | ICD-10-CM | POA: Diagnosis not present

## 2019-11-14 DIAGNOSIS — N1831 Chronic kidney disease, stage 3a: Secondary | ICD-10-CM | POA: Diagnosis not present

## 2019-11-14 DIAGNOSIS — E785 Hyperlipidemia, unspecified: Secondary | ICD-10-CM

## 2019-11-14 DIAGNOSIS — R739 Hyperglycemia, unspecified: Secondary | ICD-10-CM

## 2019-11-14 DIAGNOSIS — M159 Polyosteoarthritis, unspecified: Secondary | ICD-10-CM

## 2019-11-14 DIAGNOSIS — Z23 Encounter for immunization: Secondary | ICD-10-CM | POA: Diagnosis not present

## 2019-11-14 NOTE — Progress Notes (Signed)
Subjective:    Patient ID: [name] [name]., male    DOB: 10/13/1945, 74 y.o.   MRN: [id number]  HPI Here to follow up on issues. He feels great. He is doing well after a right knee replacement. He recently saw [name] to follow up on prostate cancer, and his PSA is up to 5.8. it continues to climb, but the doubling rate has decreased. If this gets much higher, the plan is to start on hormonal suppression. He is walking for exercise. His BP is stable.    Review of Systems  Constitutional: Negative.   HENT: Negative.   Eyes: Negative.   Respiratory: Negative.   Cardiovascular: Negative.   Gastrointestinal: Negative.   Genitourinary: Negative.   Musculoskeletal: Negative.   Skin: Negative.   Neurological: Negative.   Psychiatric/Behavioral: Negative.        Objective:   Physical Exam Constitutional:      General: He is not in acute distress.    Appearance: He is well-developed. He is not diaphoretic.  HENT:     Head: Normocephalic and atraumatic.     Right Ear: External ear normal.     Left Ear: External ear normal.     Nose: Nose normal.     Mouth/Throat:     Pharynx: No oropharyngeal exudate.  Eyes:     General: No scleral icterus.       Right eye: No discharge.        Left eye: No discharge.     Conjunctiva/sclera: Conjunctivae normal.     Pupils: Pupils are equal, round, and reactive to light.  Neck:     Thyroid: No thyromegaly.     Vascular: No JVD.     Trachea: No tracheal deviation.  Cardiovascular:     Rate and Rhythm: Normal rate and regular rhythm.     Heart sounds: Normal heart sounds. No murmur heard.  No friction rub. No gallop.   Pulmonary:     Effort: Pulmonary effort is normal. No respiratory distress.     Breath sounds: Normal breath sounds. No wheezing or rales.  Chest:     Chest wall: No tenderness.  Abdominal:     General: Bowel sounds are normal. There is no distension.     Palpations: Abdomen is soft. There is no mass.     Tenderness:  There is no abdominal tenderness. There is no guarding or rebound.  Genitourinary:    Penis: No tenderness.   Musculoskeletal:        General: No tenderness. Normal range of motion.     Cervical back: Neck supple.  Lymphadenopathy:     Cervical: No cervical adenopathy.  Skin:    General: Skin is warm and dry.     Coloration: Skin is not pale.     Findings: No erythema or rash.  Neurological:     Mental Status: He is alert and oriented to person, place, and time.     Cranial Nerves: No cranial nerve deficit.     Motor: No abnormal muscle tone.     Coordination: Coordination normal.     Deep Tendon Reflexes: Reflexes are normal and symmetric. Reflexes normal.  Psychiatric:        Behavior: Behavior normal.        Thought Content: Thought content normal.        Judgment: Judgment normal.           Assessment & Plan:  His HTN is stable. He will follow  up with [name]. Check fasting labs for lipids, A1c, etc. We will follow his CKD along with Nephrology.  Alysia Penna, MD

## 2019-11-15 LAB — CBC WITH DIFFERENTIAL/PLATELET
Absolute Monocytes: 710 cells/uL (ref 200–950)
Basophils Absolute: 133 cells/uL (ref 0–200)
Basophils Relative: 1.7 %
Eosinophils Absolute: 172 cells/uL (ref 15–500)
Eosinophils Relative: 2.2 %
HCT: 43.3 % (ref 38.5–50.0)
Hemoglobin: 14.4 g/dL (ref 13.2–17.1)
Lymphs Abs: 1911 cells/uL (ref 850–3900)
MCH: 29.5 pg (ref 27.0–33.0)
MCHC: 33.3 g/dL (ref 32.0–36.0)
MCV: 88.7 fL (ref 80.0–100.0)
MPV: 11.9 fL (ref 7.5–12.5)
Monocytes Relative: 9.1 %
Neutro Abs: 4875 cells/uL (ref 1500–7800)
Neutrophils Relative %: 62.5 %
Platelets: 246 10*3/uL (ref 140–400)
RBC: 4.88 10*6/uL (ref 4.20–5.80)
RDW: 14.1 % (ref 11.0–15.0)
Total Lymphocyte: 24.5 %
WBC: 7.8 10*3/uL (ref 3.8–10.8)

## 2019-11-15 LAB — HEPATIC FUNCTION PANEL
AG Ratio: 2.2 (calc) (ref 1.0–2.5)
ALT: 13 U/L (ref 9–46)
AST: 18 U/L (ref 10–35)
Albumin: 4.7 g/dL (ref 3.6–5.1)
Alkaline phosphatase (APISO): 43 U/L (ref 35–144)
Bilirubin, Direct: 0.1 mg/dL (ref 0.0–0.2)
Globulin: 2.1 g/dL (calc) (ref 1.9–3.7)
Indirect Bilirubin: 0.6 mg/dL (calc) (ref 0.2–1.2)
Total Bilirubin: 0.7 mg/dL (ref 0.2–1.2)
Total Protein: 6.8 g/dL (ref 6.1–8.1)

## 2019-11-15 LAB — HEMOGLOBIN A1C
Hgb A1c MFr Bld: 5.9 % of total Hgb — ABNORMAL HIGH (ref <5.7)
Mean Plasma Glucose: 123 (calc)
eAG (mmol/L): 6.8 (calc)

## 2019-11-15 LAB — LIPID PANEL
Cholesterol: 172 mg/dL (ref <200)
HDL: 48 mg/dL (ref >=40)
LDL Cholesterol (Calc): 98 mg/dL (calc)
Non-HDL Cholesterol (Calc): 124 mg/dL (calc) (ref <130)
Total CHOL/HDL Ratio: 3.6 (calc) (ref <5.0)
Triglycerides: 154 mg/dL — ABNORMAL HIGH (ref <150)

## 2019-11-15 LAB — BASIC METABOLIC PANEL
BUN/Creatinine Ratio: 13 (calc) (ref 6–22)
BUN: 21 mg/dL (ref 7–25)
CO2: 24 mmol/L (ref 20–32)
Calcium: 9.7 mg/dL (ref 8.6–10.3)
Chloride: 108 mmol/L (ref 98–110)
Creat: 1.58 mg/dL — ABNORMAL HIGH (ref 0.70–1.18)
Glucose, Bld: 109 mg/dL — ABNORMAL HIGH (ref 65–99)
Potassium: 4.7 mmol/L (ref 3.5–5.3)
Sodium: 142 mmol/L (ref 135–146)

## 2019-11-15 LAB — TSH: TSH: 2.75 mIU/L (ref 0.40–4.50)

## 2019-12-06 DIAGNOSIS — N1832 Chronic kidney disease, stage 3b: Secondary | ICD-10-CM | POA: Diagnosis not present

## 2019-12-18 ENCOUNTER — Telehealth: Payer: Self-pay | Admitting: Pharmacist

## 2019-12-18 NOTE — Progress Notes (Signed)
    Chronic Care Management Pharmacy Assistant   Name: [name] [name].  MRN: [id number] DOB: 1945/12/03  Reason for Encounter: Appointment reminder    [name] Howells.,  74 y.o. , male presents for their Follow-Up CCM visit with the clinical pharmacist via telephone.  PCP : [name], MD  Allergies:  No Known Allergies  Medications: Outpatient Encounter Medications as of 12/18/2019  Medication Sig Note  . bimatoprost (LUMIGAN) 0.01 % SOLN Place 1 drop into both eyes at bedtime.   . clopidogrel (PLAVIX) 75 MG tablet Take 1 tablet (75 mg total) by mouth daily with breakfast. (Patient taking differently: Take 75 mg by mouth daily. )   . Coenzyme Q10 (COQ-10) 100 MG CAPS Take 100 mg by mouth at bedtime.    . dorzolamide-timolol (COSOPT) 22.3-6.8 MG/ML ophthalmic solution Place 1 drop into both eyes 2 (two) times daily.    . famotidine (PEPCID) 20 MG tablet Take 20 mg by mouth at bedtime.    . fenofibrate 160 MG tablet Take 160 mg by mouth at bedtime.    . isosorbide mononitrate (IMDUR) 30 MG 24 hr tablet Take 1 tablet (30 mg total) by mouth daily. 07/22/2019: Took Imdur this morning  . magnesium oxide (MAG-OX) 400 MG tablet Take 400 mg by mouth every evening.    . pantoprazole (PROTONIX) 40 MG tablet Take 1 tablet (40 mg total) by mouth daily.   . rosuvastatin (CRESTOR) 20 MG tablet Take 20 mg by mouth every evening.    Marland Kitchen telmisartan (MICARDIS) 80 MG tablet Take 1 tablet (80 mg total) by mouth daily.    No facility-administered encounter medications on file as of 12/18/2019.    Current Diagnosis: Patient Active Problem List   Diagnosis Date Noted  . Status post total knee replacement, right 07/22/2019  . Chronic pain of right knee 05/29/2019  . Numbness and tingling 05/29/2019  . CKD (chronic kidney disease) stage 3, GFR 30-59 ml/min (HCC) 05/20/2019  . Paresthesia 01/17/2019  . Upper airway cough syndrome 09/19/2018  . DOE (dyspnea on exertion) 08/08/2018  . Abnormal  cardiac CT angiography 01/17/2018  . Angina, class II ([place]) 01/17/2018  . Incisional hernia 08/11/2011  . PROSTATE CANCER 10/05/2009  . Coronary atherosclerosis 10/05/2009  . ABNORMAL EKG 10/07/2008  . Essential hypertension 04/13/2007  . DIVERTICULOSIS, COLON 04/13/2007  . Osteoarthritis 04/13/2007  . Hyperlipidemia with target LDL less than 70 09/26/2006    Goals Addressed   None     Follow-Up:  Pharmacist Review    Called patient to confirm follow up appointment with clinical pharmacist Madeline on 12/19/2019 @1030 . The patient would like to reschedule his appointment for later in the day due to a conflict with another doctorss appointment. The patients time has been moved from 1030am to [place], [place] Team Manager/ CPA (Clinical Pharmacist Assistant) [phone]

## 2019-12-19 ENCOUNTER — Ambulatory Visit: Payer: Medicare Other

## 2019-12-19 DIAGNOSIS — E785 Hyperlipidemia, unspecified: Secondary | ICD-10-CM

## 2019-12-19 DIAGNOSIS — I10 Essential (primary) hypertension: Secondary | ICD-10-CM

## 2019-12-19 DIAGNOSIS — C61 Malignant neoplasm of prostate: Secondary | ICD-10-CM | POA: Diagnosis not present

## 2019-12-19 NOTE — Patient Instructions (Addendum)
Hi Verlie,  It was great to get to meet you over the phone! As we discussed, continue to work on some of those dietary changes to help lower your cholesterol and continue exercising regularly. I did attach some information about foods to eat to lower cholesterol and hopefully it helps!   Please call me if you need anything or have any questions before our next touch base scheduled for Jan 11th, 2022 @ 1pm.   Best, [place], PharmD Clinical Pharmacist [facility] [phone]    Visit Information  Goals Addressed            This Visit's Progress   . Pharmacy Care Plan       CARE PLAN ENTRY (see longitudinal plan of care for additional care plan information)  Current Barriers:  . Chronic Disease Management support, education, and care coordination needs related to Hypertension, Hyperlipidemia, Chronic Kidney Disease, and coronary atherosclerosis, hyperglycemia, and angina class II   Hypertension BP Readings from Last 3 Encounters:  11/14/19 126/80  07/24/19 119/71  07/19/19 (!) 147/81   . Pharmacist Clinical Goal(s): o Over the next 90 days, patient will work with PharmD and providers to maintain BP goal <130/80 . Current regimen:  o telmisartan 80mg , 1 tablet once daily  . Interventions: o Recommend: . Diet modifications. DASH diet:  following a diet emphasizing fruits and vegetables and low-fat dairy products along with whole grains, fish, poultry, and nuts. Reducing red meats and sugars.  . Exercising  . Reducing the amount of salt intake to 1500mg /per day.  . Recommend using a salt substitute to replace your salt if you need flavor.    . Patient self care activities - Over the next 90 days, patient will: o Check BP daily, document, and provide at future appointments o Ensure daily salt intake < 1500 mg/day  Hyperlipidemia Lab Results  Component Value Date/Time   LDLCALC 98 11/14/2019 09:36 AM   LDLDIRECT 82.1 04/07/2011  08:07 AM   . Pharmacist Clinical Goal(s): o Over the next 90 days, patient will work with PharmD and providers to achieve LDL goal < 70 . Current regimen:  . Rosuvastatin 20mg , 1 tablet every evening . Fenofibrate 160mg , 1 tablet at bedtime  . Interventions: o Recommend . Diet high in plant sterols (fruits/vegetables/nuts/whole grains/legumes) may reduce your cholesterol.  Encouraged increasing fiber to a daily intake of 10-25g/day  . Patient self care activities - Over the next 90 days, patient will: o Work on lifestyle modifications (diet and exercise).   Atherosclerosis of coronary artery . Pharmacist Clinical Goal(s) o Over the next 90 days, patient will work with PharmD and providers to prevent heart attacks/ strokes.  . Current regimen:  . Clopidogrel 75mg , 1 tablet with breakfast  . Rosuvastatin 20mg , 1 tablet every evening  . Interventions: o We discussed monitoring for signs of bleeding such as blood in urine or stool, nosebleeds, and unexpected bruising. . Patient self care activities o Patient will continue current medications as directed by providers.   Angina class II . Pharmacist Clinical Goal(s) o Over the next 90 days, patient will work with PharmD and providers to control chest pain.  . Current regimen:  . Isosorbide mononitrate 30mg , 1 tablet once daily . Nitroglycerin 0.4mg  SL, 1 tablet under tongue every five minutes as needed for chest pain . Interventions: o Recommend verifying expiration date of nitroglycerin and refilling as needed to maintain up-to-date supply.  . Patient self care activities o Patient will  continue current medications as directed by providers.   Hyperglycemia Lab Results  Component Value Date/Time   HGBA1C 5.9 (H) 11/14/2019 09:36 AM   HGBA1C 6.2 05/20/2019 09:26 AM   . Pharmacist Clinical Goal(s): o Over the next 90 days, patient will work with PharmD and providers to maintain A1c goal <6.5% . Current regimen:  o No  medications o Diet and exercise . Interventions: o Recommend - We will focus on managing/monitoring CVD risk factors, including keeping blood pressure and lipids under goal. We discussed modifying lifestyle, including to participate in moderate physical activity (e.g., walking) at least 150 minutes per week. Discussed a Mediterranean eating plan with an emphasis on whole grains, legumes, nuts, fruits, and vegetables and minimal refined and processed foods.  . Patient self care activities - Over the next 90 days, patient will: o Work on lifestyle interventions (diet and exercise).   Chronic kidney disease, stage 3 . Pharmacist Clinical Goal(s) o Over the next 90 days, patient will work with PharmD and providers to maintain kidney function stable.  . Patient self care activities - Over the next 90 days, patient will: o Continue follow up visits with nephrologist every 4 months.  Medication management . Pharmacist Clinical Goal(s): o Over the next 90 days, patient will work with PharmD and providers to maintain optimal medication adherence . [facility] . Interventions o Comprehensive medication review performed. o Continue current medication management strategy . Patient self care activities - Over the next 90 days, patient will: o Take medications as prescribed o Report any questions or concerns to PharmD and/or provider(s)  Please see past updates related to this goal by clicking on the "Past Updates" button in the selected goal         The patient verbalized understanding of instructions provided today and declined a print copy of patient instruction materials.   Telephone follow up appointment with pharmacy team member scheduled for: 3 months   High Cholesterol  High cholesterol is a condition in which the blood has high levels of a white, waxy, fat-like substance (cholesterol). The human body needs small amounts of cholesterol. The liver makes  all the cholesterol that the body needs. Extra (excess) cholesterol comes from the food that we eat. Cholesterol is carried from the liver by the blood through the blood vessels. If you have high cholesterol, deposits (plaques) may build up on the walls of your blood vessels (arteries). Plaques make the arteries narrower and stiffer. Cholesterol plaques increase your risk for heart attack and stroke. Work with your health care provider to keep your cholesterol levels in a healthy range. What increases the risk? This condition is more likely to develop in people who:  Eat foods that are high in animal fat (saturated fat) or cholesterol.  Are overweight.  Are not getting enough exercise.  Have a family history of high cholesterol. What are the signs or symptoms? There are no symptoms of this condition. How is this diagnosed? This condition may be diagnosed from the results of a blood test.  If you are older than age 49, your health care provider may check your cholesterol every 4-6 years.  You may be checked more often if you already have high cholesterol or other risk factors for heart disease. The blood test for cholesterol measures:  "Bad" cholesterol (LDL cholesterol). This is the main type of cholesterol that causes heart disease. The desired level for LDL is less than 100.  "Good" cholesterol (HDL cholesterol).  This type helps to protect against heart disease by cleaning the arteries and carrying the LDL away. The desired level for HDL is 60 or higher.  Triglycerides. These are fats that the body can store or burn for energy. The desired number for triglycerides is lower than 150.  Total cholesterol. This is a measure of the total amount of cholesterol in your blood, including LDL cholesterol, HDL cholesterol, and triglycerides. A healthy number is less than 200. How is this treated? This condition is treated with diet changes, lifestyle changes, and medicines. Diet changes  This  may include eating more whole grains, fruits, vegetables, nuts, and fish.  This may also include cutting back on red meat and foods that have a lot of added sugar. Lifestyle changes  Changes may include getting at least 40 minutes of aerobic exercise 3 times a week. Aerobic exercises include walking, biking, and swimming. Aerobic exercise along with a healthy diet can help you maintain a healthy weight.  Changes may also include quitting smoking. Medicines  Medicines are usually given if diet and lifestyle changes have failed to reduce your cholesterol to healthy levels.  Your health care provider may prescribe a statin medicine. Statin medicines have been shown to reduce cholesterol, which can reduce the risk of heart disease. Follow these instructions at home: Eating and drinking If told by your health care provider:  Eat chicken (without skin), fish, veal, shellfish, ground [place] breast, and round or loin cuts of red meat.  Do not eat fried foods or fatty meats, such as hot dogs and salami.  Eat plenty of fruits, such as apples.  Eat plenty of vegetables, such as broccoli, potatoes, and carrots.  Eat beans, peas, and lentils.  Eat grains such as barley, rice, couscous, and bulgur wheat.  Eat pasta without cream sauces.  Use skim or nonfat milk, and eat low-fat or nonfat yogurt and cheeses.  Do not eat or drink whole milk, cream, ice cream, egg yolks, or hard cheeses.  Do not eat stick margarine or tub margarines that contain trans fats (also called partially hydrogenated oils).  Do not eat saturated tropical oils, such as coconut oil and palm oil.  Do not eat cakes, cookies, crackers, or other baked goods that contain trans fats.  General instructions  Exercise as directed by your health care provider. Increase your activity level with activities such as gardening, walking, and taking the stairs.  Take over-the-counter and prescription medicines only as told by your  health care provider.  Do not use any products that contain nicotine or tobacco, such as cigarettes and e-cigarettes. If you need help quitting, ask your health care provider.  Keep all follow-up visits as told by your health care provider. This is important. Contact a health care provider if:  You are struggling to maintain a healthy diet or weight.  You need help to start on an exercise program.  You need help to stop smoking. Get help right away if:  You have chest pain.  You have trouble breathing. This information is not intended to replace advice given to you by your health care provider. Make sure you discuss any questions you have with your health care provider. Document Revised: 02/24/2017 Document Reviewed: 08/22/2015 Elsevier Patient Education  [place].

## 2019-12-19 NOTE — Chronic Care Management (AMB) (Signed)
Chronic Care Management Pharmacy  Name: Lynwood Kubisiak.  MRN: [id number] DOB: 02/18/1946  Initial Questions: 1. Have you seen any other providers since your last visit? Yes - [name] on 11/14/19 2. Any changes in your medicines or health? No   Chief Complaint/ HPI  [name] [name].,  74 y.o. , male presents for their Follow-Up CCM visit with the clinical pharmacist via telephone due to COVID-19 Pandemic.    PCP : [name], MD  Their chronic conditions include: HTN, HLD, Angina class II, coronary atherosclerosis, hyperglycemia, GERD, Leg cramps, glaucoma, CKD stage 3, s/p TKR  Office Visits: 11/14/19 Alysia Penna, MD: Patient presented for office visit for follow up of chronic diseases. Lipid panel repeated and LD increased to 98, A1c decreased to 5.9%, renal function stable, CBC, LFTs and TSH were WNL. No medication changes. Influenza vaccine administered.  05/20/2019- Alysia Penna, MD- patient presented for office visit for follow up. Patient referred to nephrology due to progression to CKD stage 3. Patient to obtain fasting labs: A1c, lipids, etc.   Consult Visit: 07/25/19- 08/30/19- Physical therapy- Patient completed physical therapy for stiffness of right knee, edema, difficulty walking.   07/22/2019- Hospital admission- Patient presented for right TKR.   05/01/2019- Cardiology- Jenkins Rouge, MD- Patient presented for office visit. No major interventions done. Patient cleared for right TKR.  04/18/2019- Neurology- Marcial Pacas, MD- Patient presented for office visit for intermittent numbness of left lower lip, bilateral fingertips and toes. To rule out peripheral neuropathy, EMG nerve conduction study to be done and MRI of cervical spine to rule out cervical myelopathy.   04/10/2019- Pulmonology- Christinia Gully, MD- Patient presented for office visit for DOE follow up. Patient referred back to [facility] for options to keep systolic down with exertion. Patient to work on reconditioning  exercises. Patient to follow up as needed.   Medications: Outpatient Encounter Medications as of 12/19/2019  Medication Sig Note  . bimatoprost (LUMIGAN) 0.01 % SOLN Place 1 drop into both eyes at bedtime.   . clopidogrel (PLAVIX) 75 MG tablet Take 1 tablet (75 mg total) by mouth daily with breakfast. (Patient taking differently: Take 75 mg by mouth daily. )   . dorzolamide-timolol (COSOPT) 22.3-6.8 MG/ML ophthalmic solution Place 1 drop into both eyes 2 (two) times daily.    . famotidine (PEPCID) 20 MG tablet Take 20 mg by mouth at bedtime.    . fenofibrate 160 MG tablet Take 160 mg by mouth at bedtime.    . magnesium oxide (MAG-OX) 400 MG tablet Take 400 mg by mouth every evening.    . pantoprazole (PROTONIX) 40 MG tablet Take 1 tablet (40 mg total) by mouth daily.   . rosuvastatin (CRESTOR) 20 MG tablet Take 20 mg by mouth every evening.    Marland Kitchen telmisartan (MICARDIS) 80 MG tablet Take 1 tablet (80 mg total) by mouth daily.   . Coenzyme Q10 (COQ-10) 100 MG CAPS Take 100 mg by mouth at bedtime.    . isosorbide mononitrate (IMDUR) 30 MG 24 hr tablet Take 1 tablet (30 mg total) by mouth daily. 07/22/2019: Took Imdur this morning   No facility-administered encounter medications on file as of 12/19/2019.     Current Diagnosis/Assessment:  Goals Addressed            This Visit's Progress   . Pharmacy Care Plan       CARE PLAN ENTRY (see longitudinal plan of care for additional care plan information)  Current Barriers:  .  Chronic Disease Management support, education, and care coordination needs related to Hypertension, Hyperlipidemia, Chronic Kidney Disease, and coronary atherosclerosis, hyperglycemia, and angina class II   Hypertension BP Readings from Last 3 Encounters:  11/14/19 126/80  07/24/19 119/71  07/19/19 (!) 147/81   . Pharmacist Clinical Goal(s): o Over the next 90 days, patient will work with PharmD and providers to maintain BP goal <130/80 . Current regimen:   o telmisartan 9m, 1 tablet once daily  . Interventions: o Recommend: . Diet modifications. DASH diet:  following a diet emphasizing fruits and vegetables and low-fat dairy products along with whole grains, fish, poultry, and nuts. Reducing red meats and sugars.  . Exercising  . Reducing the amount of salt intake to <15011mper day.  . Recommend using a salt substitute to replace your salt if you need flavor.    . Patient self care activities - Over the next 90 days, patient will: o Check BP daily, document, and provide at future appointments o Ensure daily salt intake < 1500 mg/day  Hyperlipidemia Lab Results  Component Value Date/Time   LDLCALC 98 11/14/2019 09:36 AM   LDLDIRECT 82.1 04/07/2011 08:07 AM   . Pharmacist Clinical Goal(s): o Over the next 90 days, patient will work with PharmD and providers to achieve LDL goal < 70 . Current regimen:  . Rosuvastatin 2025m1 tablet every evening . Fenofibrate 160m76m tablet at bedtime  . Interventions: o Recommend . Diet high in plant sterols (fruits/vegetables/nuts/whole grains/legumes) may reduce your cholesterol.  Encouraged increasing fiber to a daily intake of 10-25g/day  . Patient self care activities - Over the next 90 days, patient will: o Work on lifestyle modifications (diet and exercise).   Atherosclerosis of coronary artery . Pharmacist Clinical Goal(s) o Over the next 90 days, patient will work with PharmD and providers to prevent heart attacks/ strokes.  . Current regimen:  . Clopidogrel 75mg17mtablet with breakfast  . Rosuvastatin 20mg,63mablet every evening  . Interventions: o We discussed monitoring for signs of bleeding such as blood in urine or stool, nosebleeds, and unexpected bruising. . Patient self care activities o Patient will continue current medications as directed by providers.   Angina class II . Pharmacist Clinical Goal(s) o Over the next 90 days, patient will work with PharmD and providers to  control chest pain.  . Current regimen:  . Isosorbide mononitrate 30mg, 18mblet once daily . Nitroglycerin 0.4mg SL,56mtablet under tongue every five minutes as needed for chest pain . Interventions: o Recommend verifying expiration date of nitroglycerin and refilling as needed to maintain up-to-date supply.  . Patient self care activities o Patient will continue current medications as directed by providers.   Hyperglycemia Lab Results  Component Value Date/Time   HGBA1C 5.9 (H) 11/14/2019 09:36 AM   HGBA1C 6.2 05/20/2019 09:26 AM   . Pharmacist Clinical Goal(s): o Over the next 90 days, patient will work with PharmD and providers to maintain A1c goal <6.5% . Current regimen:  o No medications o Diet and exercise . Interventions: o Recommend - We will focus on managing/monitoring CVD risk factors, including keeping blood pressure and lipids under goal. We discussed modifying lifestyle, including to participate in moderate physical activity (e.g., walking) at least 150 minutes per week. Discussed a Mediterranean eating plan with an emphasis on whole grains, legumes, nuts, fruits, and vegetables and minimal refined and processed foods.  . Patient self care activities - Over the next 90 days, patient will:  o Work on lifestyle interventions (diet and exercise).   Chronic kidney disease, stage 3 . Pharmacist Clinical Goal(s) o Over the next 90 days, patient will work with PharmD and providers to maintain kidney function stable.  . Patient self care activities - Over the next 90 days, patient will: o Continue follow up visits with nephrologist every 4 months.  Medication management . Pharmacist Clinical Goal(s): o Over the next 90 days, patient will work with PharmD and providers to maintain optimal medication adherence . [facility] . Interventions o Comprehensive medication review performed. o Continue current medication management  strategy . Patient self care activities - Over the next 90 days, patient will: o Take medications as prescribed o Report any questions or concerns to PharmD and/or provider(s)  Please see past updates related to this goal by clicking on the "Past Updates" button in the selected goal          SDOH Interventions     Most Recent Value  SDOH Interventions  Financial Strain Interventions Intervention Not Indicated  Transportation Interventions Intervention Not Indicated       Hypertension   Denies dizziness/ lightheadedness.   Office blood pressures are  BP Readings from Last 3 Encounters:  11/14/19 126/80  07/24/19 119/71  07/19/19 (!) 147/81   Patient has failed these meds in the past: lisinopril/ HCTZ   Patient checks BP at home daily  Patient home BP readings are ranging: 128/74, 130/80 (high for him)  Patient is controlled on:   telmisartan 8m, 1 tablet once daily - in AM   We discussed diet and exercise extensively  . Diet: patient reports he has cut down on salt in his diet and has noticed improvement in his BP . Exercising: recommended 150 mins/week as he slowly increases his exercise; patient is somewhat limited with recent knee replacement . Reducing the amount of salt intake to <1502mper day.  . Recommend using a salt substitute to replace your salt if you need flavor.    Plan Continue current medications and control with diet and exercise   Hyperlipidemia   LDL goal < 70   Lipid Panel     Component Value Date/Time   CHOL 172 11/14/2019 0936   TRIG 154 (H) 11/14/2019 0936   HDL 48 11/14/2019 0936   LDLCALC 98 11/14/2019 0936   LDLDIRECT 82.1 04/07/2011 0807    Hepatic Function Latest Ref Rng & Units 11/14/2019 05/20/2019 11/13/2018  Total Protein 6.1 - 8.1 g/dL 6.8 6.7 7.1  Albumin 3.5 - 5.2 g/dL - 4.4 4.8  AST 10 - 35 U/L '18 17 21  ' ALT 9 - 46 U/L '13 18 22  ' Alk Phosphatase 39 - 117 U/L - 34(L) 45  Total Bilirubin 0.2 - 1.2 mg/dL 0.7 0.6 0.7   Bilirubin, Direct 0.0 - 0.2 mg/dL 0.1 0.1 0.1     The 10-year ASCVD risk score (GMikey BussingC Jr., et al., 2013) is: N/A- patient has ASCVD (coronary atherosclerosis)  Patient has failed these meds in past: none   Patient is currently uncontrolled on the following medications:  . Rosuvastatin 2026m1 tablet every evening  . Fenofibrate 160m23m tablet at bedtime   We discussed:   diet and exercise extensively   Increasing rosuvastatin to lower LDL and patient opted to work on his diet - he was on vacation for a while and reported not having a routine  Exercise: patient is walking more regularly   Diet: discussed adding fiber to  diet to lower cholesterol - patient cut down to 2 eggs every other day and eats oatmeal every other day sometimes with walnuts  Plan Continue current medications and control with diet and exercise  Consider dose increase in statin or addition of ezetimibe. Readdress at follow up.   CAD (coronary atherosclerosis)    Patient reports having blockage and stents in the past.   Patient has failed these meds in past: none  Patient is currently controlled on the following medications:  . Clopidogrel 61m, 1 tablet with breakfast  . Rosuvastatin 234m 1 tablet every evening (high intensity statin)   We discussed: monitoring for signs of bleeding (nosebleeds, unexpected bruising, blood in urine or stool)  Plan Continue current medications  Angina, class II  Denies chest pain, shortness of breath.  Denies needing to use nitroglycerin in the past.   Patient has failed these meds in past: none   Patient is currently controlled on the following medications:  . Isosorbide mononitrate 3030m1 tablet once daily . Nitroglycerin 0.4mg80m, 1 tablet under tongue every five minutes as needed for chest pain  Plan Continue current medications    Hyperglycemia    Recent Relevant Labs: Lab Results  Component Value Date/Time   HGBA1C 5.9 (H) 11/14/2019 09:36 AM    HGBA1C 6.2 05/20/2019 09:26 AM    Patient has failed these meds in past: none  Patient is currently controlled on the following medications:   Lifestyle interventions  We discussed: diet and exercise extensively  We will focus on managing/monitoring CVD risk factors, including keeping blood pressure and lipids under goal.  . Discussed a Mediterranean eating plan with an emphasis on whole grains, legumes, nuts, fruits, and vegetables and minimal refined and processed foods.  -Exercise: Patient walks on treadmill once a week, walks outside with wife once a week on a track -Discussed A1c decrease from most recent visit and congratulated him on this accomplished through diet and exercise   Plan Continue control with diet and exercise   GERD   Patient has failed these meds in past: omeprazole   Patient is currently controlled on the following medications:  . Pantoprazole 40mg58mtablet once daily (takes as soon as he wakes up)  . Famotidine 20mg,75mablet at bedtime   Plan Managed by GI (DanieOwens LofflerContinue current medications  Leg cramps    Patient is currently controlled on the following medications:   Magnesium oxide 400mg, 94mblet every evening  Plan Continue current medications  Glaucoma    Patient is currently controlled on the following medications:  . bimatoprost 0.01%, 1 drop into both eyes at bedtime  . Dorzolamide- timolol 22.3-6.8 mg/ml, 1 drop into both eyes twice dailiy   Plan Continue current medications   S/p TKR   Completed/ not taking anymore:  - oxycodone/ acetaminophen 5-325mg - 62mocarbamol 500mg   P24mnt is currently controlled on the following medications:  . No medications   Plan Continue as is.   CKD, Stage 3   Kidney Function Lab Results  Component Value Date/Time   CREATININE 1.58 (H) 11/14/2019 09:36 AM   CREATININE 1.42 (H) 07/23/2019 03:06 AM   CREATININE 1.71 (H) 07/19/2019 10:38 AM   GFR 43.07 (L) 05/20/2019  09:26 AM   GFRNONAA 49 (L) 07/23/2019 03:06 AM   GFRAA 57 (L) 07/23/2019 03:06 AM   K 4.7 11/14/2019 09:36 AM   K 3.8 07/23/2019 03:06 AM   Plan Managed by nephrology (carolinaNarda Amber- sees every  4 months. Continue to monitor and adjust medications as needed.    Vaccines   Reviewed and discussed patient's vaccination history.    Immunization History  Administered Date(s) Administered  . Fluad Quad(high Dose 65+) 11/13/2018, 11/14/2019  . Influenza Split 04/14/2011  . Influenza Whole 02/18/2009  . Influenza, High Dose Seasonal PF 01/01/2013, 11/06/2014, 11/06/2015, 11/08/2016, 11/07/2017  . Influenza,inj,Quad PF,6+ Mos 10/29/2013  . Moderna SARS-COVID-2 Vaccination 04/05/2019, 05/03/2019  . Pneumococcal Conjugate-13 06/26/2013  . Pneumococcal Polysaccharide-23 05/05/2015  . Td 05/10/2006  . Tdap 02/08/2018  . Zoster 06/13/2011  . Zoster Recombinat (Shingrix) 08/27/2019, 10/30/2019    Plan Patient is up to date with all immunizations.   Medication Management   Pt uses [facility] for all medications and [facility] Uses pill box? Yes - patient reports using pill box (AM/PM)  Pt endorses 100% compliance  We discussed: Current pharmacy is preferred with insurance plan and patient is satisfied with pharmacy services  Plan  Continue current medication management strategy   Follow up: 3 month phone visit   [name], PharmD Clinical Pharmacist [facility] [phone]

## 2019-12-25 DIAGNOSIS — N393 Stress incontinence (female) (male): Secondary | ICD-10-CM | POA: Diagnosis not present

## 2019-12-25 DIAGNOSIS — N5231 Erectile dysfunction following radical prostatectomy: Secondary | ICD-10-CM | POA: Diagnosis not present

## 2019-12-25 DIAGNOSIS — B356 Tinea cruris: Secondary | ICD-10-CM | POA: Diagnosis not present

## 2019-12-25 DIAGNOSIS — C61 Malignant neoplasm of prostate: Secondary | ICD-10-CM | POA: Diagnosis not present

## 2019-12-25 DIAGNOSIS — R9721 Rising PSA following treatment for malignant neoplasm of prostate: Secondary | ICD-10-CM | POA: Diagnosis not present

## 2020-02-12 DIAGNOSIS — Z961 Presence of intraocular lens: Secondary | ICD-10-CM | POA: Diagnosis not present

## 2020-02-12 DIAGNOSIS — H401131 Primary open-angle glaucoma, bilateral, mild stage: Secondary | ICD-10-CM | POA: Diagnosis not present

## 2020-02-12 DIAGNOSIS — H43811 Vitreous degeneration, right eye: Secondary | ICD-10-CM | POA: Diagnosis not present

## 2020-02-20 DIAGNOSIS — M1732 Unilateral post-traumatic osteoarthritis, left knee: Secondary | ICD-10-CM | POA: Diagnosis not present

## 2020-03-16 ENCOUNTER — Telehealth: Payer: Self-pay | Admitting: Pharmacist

## 2020-03-16 NOTE — Chronic Care Management (AMB) (Signed)
I left the patient a message about his upcoming appointment on 03/17/2020 @ 1:00 PM with the clinical pharmacist. He was asked to please have all medication on hand to review the pharmacist.  Maia Breslow, [place] Assistant [phone]

## 2020-03-17 ENCOUNTER — Ambulatory Visit: Payer: Medicare Other | Admitting: Pharmacist

## 2020-03-17 DIAGNOSIS — E785 Hyperlipidemia, unspecified: Secondary | ICD-10-CM

## 2020-03-17 DIAGNOSIS — I10 Essential (primary) hypertension: Secondary | ICD-10-CM

## 2020-03-17 NOTE — Chronic Care Management (AMB) (Signed)
Chronic Care Management Pharmacy  Name: [name] [name].  MRN: [id number] DOB: 1946/02/18  Initial Questions: 1. Have you seen any other providers since your last visit? Yes - [name] 12/25/19 2. Any changes in your medicines or health? No   Chief Complaint/ HPI  [name] [name].,  75 y.o. , male presents for their Follow-Up CCM visit with the clinical pharmacist via telephone due to COVID-19 Pandemic.    PCP : [name], MD  Their chronic conditions include: HTN, HLD, Angina class II, coronary atherosclerosis, hyperglycemia, GERD, Leg cramps, glaucoma, CKD stage 3, s/p TKR  Office Visits: 11/14/19 Alysia Penna, MD: Patient presented for office visit for follow up of chronic diseases. Lipid panel repeated and LD increased to 98, A1c decreased to 5.9%, renal function stable, CBC, LFTs and TSH were WNL. No medication changes. Influenza vaccine administered.  05/20/2019- Alysia Penna, MD- patient presented for office visit for follow up. Patient referred to nephrology due to progression to CKD stage 3. Patient to obtain fasting labs: A1c, lipids, etc.   Consult Visit: 02/20/20 [facility]): Patient presented for OA follow up in left knee.  12/25/19 Irine Seal, MD (urology): Patient presented for follow up. Unable to access notes.  07/25/19- 08/30/19- Physical therapy- Patient completed physical therapy for stiffness of right knee, edema, difficulty walking.   07/22/2019- Hospital admission- Patient presented for right TKR.   05/01/2019- Cardiology- Jenkins Rouge, MD- Patient presented for office visit. No major interventions done. Patient cleared for right TKR.  04/18/2019- Neurology- Marcial Pacas, MD- Patient presented for office visit for intermittent numbness of left lower lip, bilateral fingertips and toes. To rule out peripheral neuropathy, EMG nerve conduction study to be done and MRI of cervical spine to rule out cervical myelopathy.   04/10/2019- Pulmonology-  Christinia Gully, MD- Patient presented for office visit for DOE follow up. Patient referred back to [facility] for options to keep systolic down with exertion. Patient to work on reconditioning exercises. Patient to follow up as needed.   Medications: Outpatient Encounter Medications as of 03/17/2020  Medication Sig Note  . Ergocalciferol (VITAMIN D2) 50 MCG (2000 UT) TABS Take 1 tablet by mouth daily.   . bimatoprost (LUMIGAN) 0.01 % SOLN Place 1 drop into both eyes at bedtime.   . clopidogrel (PLAVIX) 75 MG tablet Take 1 tablet (75 mg total) by mouth daily with breakfast. (Patient taking differently: Take 75 mg by mouth daily. )   . dorzolamide-timolol (COSOPT) 22.3-6.8 MG/ML ophthalmic solution Place 1 drop into both eyes 2 (two) times daily.    . famotidine (PEPCID) 20 MG tablet Take 20 mg by mouth at bedtime.    . fenofibrate 160 MG tablet Take 160 mg by mouth at bedtime.    . isosorbide mononitrate (IMDUR) 30 MG 24 hr tablet Take 1 tablet (30 mg total) by mouth daily. 07/22/2019: Took Imdur this morning  . magnesium oxide (MAG-OX) 400 MG tablet Take 400 mg by mouth every evening.    . pantoprazole (PROTONIX) 40 MG tablet Take 1 tablet (40 mg total) by mouth daily.   . rosuvastatin (CRESTOR) 20 MG tablet Take 20 mg by mouth every evening.    Marland Kitchen telmisartan (MICARDIS) 80 MG tablet Take 1 tablet (80 mg total) by mouth daily.   . [DISCONTINUED] Coenzyme Q10 (COQ-10) 100 MG CAPS Take 100 mg by mouth at bedtime.     No facility-administered encounter medications on file as of 03/17/2020.   Patient reports that the  biggest change is that he may need another knee replacement. He also plans to start hormone therapy with [name] but needs to wait until there is specific PSA increase until he starts. He is unsure of the exact value but has been following it closely with [name].   Current Diagnosis/Assessment:  Goals Addressed            This Visit's Progress   . Pharmacy Care Plan       CARE PLAN  ENTRY (see longitudinal plan of care for additional care plan information)  Current Barriers:  . Chronic Disease Management support, education, and care coordination needs related to Hypertension, Hyperlipidemia, Chronic Kidney Disease, and coronary atherosclerosis, hyperglycemia, and angina class II   Hypertension BP Readings from Last 3 Encounters:  11/14/19 126/80  07/24/19 119/71  07/19/19 (!) 147/81   . Pharmacist Clinical Goal(s): o Over the next 120 days, patient will work with PharmD and providers to maintain BP goal <130/80 . Current regimen:  o telmisartan 1m, 1 tablet once daily  . Interventions: o Recommend: . Diet modifications. DASH diet:  following a diet emphasizing fruits and vegetables and low-fat dairy products along with whole grains, fish, poultry, and nuts. Reducing red meats and sugars.  . Exercising  . Reducing the amount of salt intake to <15066mper day.  . Recommend using a salt substitute to replace your salt if you need flavor.    . Patient self care activities - Over the next 120 days, patient will: o Check BP daily, document, and provide at future appointments o Ensure daily salt intake < 1500 mg/day  Hyperlipidemia Lab Results  Component Value Date/Time   LDLCALC 98 11/14/2019 09:36 AM   LDLDIRECT 82.1 04/07/2011 08:07 AM   . Pharmacist Clinical Goal(s): o Over the next 120 days, patient will work with PharmD and providers to achieve LDL goal < 70 . Current regimen:  . Rosuvastatin 2068m1 tablet every evening . Fenofibrate 160m22m tablet at bedtime  . Interventions: o Recommend . Diet high in plant sterols (fruits/vegetables/nuts/whole grains/legumes) may reduce your cholesterol.  Encouraged increasing fiber to a daily intake of 10-25g/day  . Patient self care activities - Over the next 120 days, patient will: o Work on lifestyle modifications (diet and exercise).  o [facility] and [facility] with  [facility]  Atherosclerosis of coronary artery . Pharmacist Clinical Goal(s) o Over the next 120 days, patient will work with PharmD and providers to prevent heart attacks/ strokes.  . Current regimen:  . Clopidogrel 75mg50mtablet with breakfast  . Rosuvastatin 20mg,78mablet every evening  . Interventions: o We discussed monitoring for signs of bleeding such as blood in urine or stool, nosebleeds, and unexpected bruising. . Patient self care activities o Patient will continue current medications as directed by providers.   Angina class II . Pharmacist Clinical Goal(s) o Over the next 120 days, patient will work with PharmD and providers to control chest pain.  . Current regimen:  . Isosorbide mononitrate 30mg, 19mblet once daily . Nitroglycerin 0.4mg SL,65mtablet under tongue every five minutes as needed for chest pain . Interventions: o Recommend verifying expiration date of nitroglycerin and refilling as needed to maintain up-to-date supply.  . Patient self care activities o Patient will continue current medications as directed by providers.   Hyperglycemia Lab Results  Component Value Date/Time   HGBA1C 5.9 (H) 11/14/2019 09:36 AM   HGBA1C 6.2 05/20/2019 09:26  AM   . Pharmacist Clinical Goal(s): o Over the next 90 days, patient will work with PharmD and providers to maintain A1c goal <6.5% . Current regimen:  o No medications o Diet and exercise . Interventions: o Recommend - We will focus on managing/monitoring CVD risk factors, including keeping blood pressure and lipids under goal. We discussed modifying lifestyle, including to participate in moderate physical activity (e.g., walking) at least 150 minutes per week. Discussed a Mediterranean eating plan with an emphasis on whole grains, legumes, nuts, fruits, and vegetables and minimal refined and processed foods.  . Patient self care activities - Over the next 120 days, patient will: o Work on lifestyle interventions  (diet and exercise).   Chronic kidney disease, stage 3 . Pharmacist Clinical Goal(s) o Over the next 120 days, patient will work with PharmD and providers to maintain kidney function stable.  . Patient self care activities - Over the next 120 days, patient will: o Continue follow up visits with nephrologist every 4 months.  Medication management . Pharmacist Clinical Goal(s): o Over the next 120 days, patient will work with PharmD and providers to maintain optimal medication adherence . [facility] . Interventions o Comprehensive medication review performed. o Continue current medication management strategy . Patient self care activities - Over the next 120 days, patient will: o Take medications as prescribed o Report any questions or concerns to PharmD and/or provider(s)  Please see past updates related to this goal by clicking on the "Past Updates" button in the selected goal           Hypertension   Denies dizziness/ lightheadedness.   Office blood pressures are  BP Readings from Last 3 Encounters:  11/14/19 126/80  07/24/19 119/71  07/19/19 (!) 147/81   Patient has failed these meds in the past: lisinopril/ HCTZ   Patient checks BP at home daily  Patient home BP readings are ranging: 126/66  Patient is controlled on:   telmisartan 63m, 1 tablet once daily - in AM   We discussed diet and exercise extensively  . Diet: patient reports he has cut down on salt in his diet and has noticed improvement in his BP . Exercising: recommended 150 mins/week as he slowly increases his exercise; patient is somewhat limited with recent knee replacement . Reducing the amount of salt intake to <15027mper day.  . Recommend using a salt substitute to replace your salt if you need flavor.   . Patient reports his wife cooks "too good" and that he used to be able to exercise a lot more but is limited with his knee  Plan Continue current medications  and control with diet and exercise   Hyperlipidemia   LDL goal < 70   Lipid Panel     Component Value Date/Time   CHOL 172 11/14/2019 0936   TRIG 154 (H) 11/14/2019 0936   HDL 48 11/14/2019 0936   LDLCALC 98 11/14/2019 0936   LDLDIRECT 82.1 04/07/2011 0807    Hepatic Function Latest Ref Rng & Units 11/14/2019 05/20/2019 11/13/2018  Total Protein 6.1 - 8.1 g/dL 6.8 6.7 7.1  Albumin 3.5 - 5.2 g/dL - 4.4 4.8  AST 10 - 35 U/L _0 ALT 9 - 46 U/L _1 Alk Phosphatase 39 - 117 U/L - 34(L) 45  Total Bilirubin 0.2 - 1.2 mg/dL 0.7 0.6 0.7  Bilirubin, Direct 0.0 - 0.2 mg/dL 0.1 0.1 0.1     The 10-year  ASCVD risk score Mikey Bussing DC Jr., et al., 2013) is: N/A - patient has ASCVD (coronary atherosclerosis)  Patient has failed these meds in past: none   Patient is currently uncontrolled on the following medications:  . Rosuvastatin 29m, 1 tablet every evening  . Fenofibrate 1668m 1 tablet at bedtime   We discussed:   diet and exercise extensively   Diet: discussed adding fiber to diet to lower cholesterol - patient cut down to 2 eggs every other day and eats oatmeal every other day sometimes with walnuts  Plan Continue current medications and control with diet and exercise  Consider dose increase in statin or addition of ezetimibe.  [facility] [place] [facility] [place] and thought his LDL was good but unable to confirm - recommended to get or bring to next office visit  CAD (coronary atherosclerosis)    Patient reports having blockage and stents in the past.   Patient has failed these meds in past: none  Patient is currently controlled on the following medications:  . Clopidogrel 7557m1 tablet with breakfast  . Rosuvastatin 23m76m tablet every evening (high intensity statin)   We discussed: monitoring for signs of bleeding (nosebleeds, unexpected bruising, blood in urine or stool)  Plan Continue current medications  Angina, class II  Denies  chest pain, shortness of breath.  Denies needing to use nitroglycerin in the past.   Patient has failed these meds in past: none   Patient is currently controlled on the following medications:  . Isosorbide mononitrate 30mg30mtablet once daily . Nitroglycerin 0.4mg S62m1 tablet under tongue every five minutes as needed for chest pain  Plan Continue current medications    Hyperglycemia    Recent Relevant Labs: Lab Results  Component Value Date/Time   HGBA1C 5.9 (H) 11/14/2019 09:36 AM   HGBA1C 6.2 05/20/2019 09:26 AM    Patient has failed these meds in past: none  Patient is currently controlled on the following medications:   Lifestyle interventions  We discussed: diet and exercise extensively  We will focus on managing/monitoring CVD risk factors, including keeping blood pressure and lipids under goal.    Plan Continue control with diet and exercise   GERD   Patient has failed these meds in past: omeprazole   Patient is currently controlled on the following medications:  . Pantoprazole 40mg, 11mblet once daily (takes as soon as he wakes up)  . Famotidine 23mg, 145mlet at bedtime   Plan Managed by GI (Daniel Owens Lofflerntinue current medications  Leg cramps    Patient is currently controlled on the following medications:   Magnesium oxide 400mg, 1 80met every evening  Plan Continue current medications  Glaucoma    Patient is currently controlled on the following medications:  . bimatoprost 0.01%, 1 drop into both eyes at bedtime  . Dorzolamide- timolol 22.3-6.8 mg/ml, 1 drop into both eyes twice dailiy   Plan Continue current medications   S/p TKR   Completed/ not taking anymore:  - oxycodone/ acetaminophen 5-325mg - me59marbamol 500mg   Pat52m is currently controlled on the following medications:  . No medications   Plan Continue as is.   Miscellaneous   Last vitamin D Lab Results  Component Value Date   VD25OH 23.0 (L)  01/17/2019    Patient is currently on the following medications:   Vitamin D3 50 mcg (2000 units) 1 tablet daily   Plan  Continue current medications Recommend repeat vitamin  D level.  CKD, Stage 3   Kidney Function Lab Results  Component Value Date/Time   CREATININE 1.58 (H) 11/14/2019 09:36 AM   CREATININE 1.42 (H) 07/23/2019 03:06 AM   CREATININE 1.71 (H) 07/19/2019 10:38 AM   GFR 43.07 (L) 05/20/2019 09:26 AM   GFRNONAA 49 (L) 07/23/2019 03:06 AM   GFRAA 57 (L) 07/23/2019 03:06 AM   K 4.7 11/14/2019 09:36 AM   K 3.8 07/23/2019 03:06 AM   Plan Managed by nephrology Narda Amber kidney) - sees every 4 months. Continue to monitor and adjust medications as needed.    Vaccines   Reviewed and discussed patient's vaccination history.    Immunization History  Administered Date(s) Administered  . Fluad Quad(high Dose 65+) 11/13/2018, 11/14/2019  . Influenza Split 04/14/2011  . Influenza Whole 02/18/2009  . Influenza, High Dose Seasonal PF 01/01/2013, 11/06/2014, 11/06/2015, 11/08/2016, 11/07/2017  . Influenza,inj,Quad PF,6+ Mos 10/29/2013  . Moderna Sars-Covid-2 Vaccination 04/05/2019, 05/03/2019  . Pneumococcal Conjugate-13 06/26/2013  . Pneumococcal Polysaccharide-23 05/05/2015  . Td 05/10/2006  . Tdap 02/08/2018  . Zoster 06/13/2011  . Zoster Recombinat (Shingrix) 08/27/2019, 10/30/2019    Plan Patient is up to date with all immunizations.   Medication Management   Pt uses [facility] for all medications and [facility] Uses pill box? Yes - patient reports using pill box (AM/PM)  Pt endorses 100% compliance  We discussed: Current pharmacy is preferred with insurance plan and patient is satisfied with pharmacy services  Plan  Continue current medication management strategy   Follow up: 4 month phone visit   [name], PharmD Clinical Pharmacist [place] [facility] [phone]

## 2020-03-31 NOTE — Patient Instructions (Addendum)
Hi [name] [name],  It was great getting to speak with you again! I'm glad everything is going well with you! Keep up the good work with taking care of yourself through lifestyle changes, taking your medications as prescribed and checking your blood pressure. Keep working on adding as much exercise as you can tolerate throughout the day as this can make a big impact on your cholesterol and blood pressure. I did also attach some information that I hope will be helpful for eating to lower your cholesterol to goal.  Please give me a call if you have any questions or need anything before our follow up!  Best, Maddie  [name], PharmD [facility] Clinical Pharmacist [place] [facility]   Visit Information  Goals Addressed            This Visit's Progress   . Pharmacy Care Plan       CARE PLAN ENTRY (see longitudinal plan of care for additional care plan information)  Current Barriers:  . Chronic Disease Management support, education, and care coordination needs related to Hypertension, Hyperlipidemia, Chronic Kidney Disease, and coronary atherosclerosis, hyperglycemia, and angina class II   Hypertension BP Readings from Last 3 Encounters:  11/14/19 126/80  07/24/19 119/71  07/19/19 (!) 147/81   . Pharmacist Clinical Goal(s): o Over the next 120 days, patient will work with PharmD and providers to maintain BP goal <130/80 . Current regimen:  o telmisartan 80mg , 1 tablet once daily  . Interventions: o Recommend: . Diet modifications. DASH diet:  following a diet emphasizing fruits and vegetables and low-fat dairy products along with whole grains, fish, poultry, and nuts. Reducing red meats and sugars.  . Exercising  . Reducing the amount of salt intake to 1500mg /per day.  . Recommend using a salt substitute to replace your salt if you need flavor.    . Patient self care activities - Over the next 120 days, patient will: o Check BP daily, document, and  provide at future appointments o Ensure daily salt intake < 1500 mg/day  Hyperlipidemia Lab Results  Component Value Date/Time   LDLCALC 98 11/14/2019 09:36 AM   LDLDIRECT 82.1 04/07/2011 08:07 AM   . Pharmacist Clinical Goal(s): o Over the next 120 days, patient will work with PharmD and providers to achieve LDL goal < 70 . Current regimen:  . Rosuvastatin 20mg , 1 tablet every evening . Fenofibrate 160mg , 1 tablet at bedtime  . Interventions: o Recommend . Diet high in plant sterols (fruits/vegetables/nuts/whole grains/legumes) may reduce your cholesterol.  Encouraged increasing fiber to a daily intake of 10-25g/day  . Patient self care activities - Over the next 120 days, patient will: o Work on lifestyle modifications (diet and exercise).  o [facility] and provide [facility] with results  Atherosclerosis of coronary artery . Pharmacist Clinical Goal(s) o Over the next 120 days, patient will work with PharmD and providers to prevent heart attacks/ strokes.  . Current regimen:  . Clopidogrel 75mg , 1 tablet with breakfast  . Rosuvastatin 20mg , 1 tablet every evening  . Interventions: o We discussed monitoring for signs of bleeding such as blood in urine or stool, nosebleeds, and unexpected bruising. . Patient self care activities o Patient will continue current medications as directed by providers.   Angina class II . Pharmacist Clinical Goal(s) o Over the next 120 days, patient will work with PharmD and providers to control chest pain.  . Current regimen:  . Isosorbide mononitrate 30mg , 1 tablet once  daily . Nitroglycerin 0.4mg  SL, 1 tablet under tongue every five minutes as needed for chest pain . Interventions: o Recommend verifying expiration date of nitroglycerin and refilling as needed to maintain up-to-date supply.  . Patient self care activities o Patient will continue current medications as directed by providers.   Hyperglycemia Lab  Results  Component Value Date/Time   HGBA1C 5.9 (H) 11/14/2019 09:36 AM   HGBA1C 6.2 05/20/2019 09:26 AM   . Pharmacist Clinical Goal(s): o Over the next 90 days, patient will work with PharmD and providers to maintain A1c goal <6.5% . Current regimen:  o No medications o Diet and exercise . Interventions: o Recommend - We will focus on managing/monitoring CVD risk factors, including keeping blood pressure and lipids under goal. We discussed modifying lifestyle, including to participate in moderate physical activity (e.g., walking) at least 150 minutes per week. Discussed a Mediterranean eating plan with an emphasis on whole grains, legumes, nuts, fruits, and vegetables and minimal refined and processed foods.  . Patient self care activities - Over the next 120 days, patient will: o Work on lifestyle interventions (diet and exercise).   Chronic kidney disease, stage 3 . Pharmacist Clinical Goal(s) o Over the next 120 days, patient will work with PharmD and providers to maintain kidney function stable.  . Patient self care activities - Over the next 120 days, patient will: o Continue follow up visits with nephrologist every 4 months.  Medication management . Pharmacist Clinical Goal(s): o Over the next 120 days, patient will work with PharmD and providers to maintain optimal medication adherence . [facility] . Interventions o Comprehensive medication review performed. o Continue current medication management strategy . Patient self care activities - Over the next 120 days, patient will: o Take medications as prescribed o Report any questions or concerns to PharmD and/or provider(s)  Please see past updates related to this goal by clicking on the "Past Updates" button in the selected goal         Patient verbalizes understanding of instructions provided today and agrees to view in [place].   Telephone follow up appointment with pharmacy team  member scheduled for: 4 months  Viona Gilmore, [facility]  Cholesterol Content in Foods Cholesterol is a waxy, fat-like substance that helps to carry fat in the blood. The body needs cholesterol in small amounts, but too much cholesterol can cause damage to the arteries and heart. Most people should eat less than 200 milligrams (mg) of cholesterol a day. Foods with cholesterol Cholesterol is found in animal-based foods, such as meat, seafood, and dairy. Generally, low-fat dairy and lean meats have less cholesterol than full-fat dairy and fatty meats. The milligrams of cholesterol per serving (mg per serving) of common cholesterol-containing foods are listed below. Meat and other proteins  Egg -- one large whole egg has 186 mg.  Veal shank -- 4 oz has 141 mg.  Lean ground [place] (93% lean) -- 4 oz has 118 mg.  Fat-trimmed lamb loin -- 4 oz has 106 mg.  Lean ground beef (90% lean) -- 4 oz has 100 mg.  Lobster -- 3.5 oz has 90 mg.  Pork loin chops -- 4 oz has 86 mg.  Canned salmon -- 3.5 oz has 83 mg.  Fat-trimmed beef top loin -- 4 oz has 78 mg.  Frankfurter -- 1 frank (3.5 oz) has 77 mg.  Crab -- 3.5 oz has 71 mg.  Roasted chicken without skin, white meat -- 4  oz has 66 mg.  Light bologna -- 2 oz has 45 mg.  Deli-cut [place] -- 2 oz has 31 mg.  Canned tuna -- 3.5 oz has 31 mg.  Berniece Salines -- 1 oz has 29 mg.  Oysters and mussels (raw) -- 3.5 oz has 25 mg.  Mackerel -- 1 oz has 22 mg.  Trout -- 1 oz has 20 mg.  Pork sausage -- 1 link (1 oz) has 17 mg.  Salmon -- 1 oz has 16 mg.  Tilapia -- 1 oz has 14 mg. Dairy  Soft-serve ice cream --  cup (4 oz) has 103 mg.  Whole-milk yogurt -- 1 cup (8 oz) has 29 mg.  Cheddar cheese -- 1 oz has 28 mg.  American cheese -- 1 oz has 28 mg.  Whole milk -- 1 cup (8 oz) has 23 mg.  2% milk -- 1 cup (8 oz) has 18 mg.  Cream cheese -- 1 tablespoon (Tbsp) has 15 mg.  Cottage cheese --  cup (4 oz) has 14 mg.  Low-fat (1%) milk  -- 1 cup (8 oz) has 10 mg.  Sour cream -- 1 Tbsp has 8.5 mg.  Low-fat yogurt -- 1 cup (8 oz) has 8 mg.  Nonfat Greek yogurt -- 1 cup (8 oz) has 7 mg.  Half-and-half cream -- 1 Tbsp has 5 mg. Fats and oils  Cod liver oil -- 1 tablespoon (Tbsp) has 82 mg.  Butter -- 1 Tbsp has 15 mg.  Lard -- 1 Tbsp has 14 mg.  Bacon grease -- 1 Tbsp has 14 mg.  Mayonnaise -- 1 Tbsp has 5-10 mg.  Margarine -- 1 Tbsp has 3-10 mg. Exact amounts of cholesterol in these foods may vary depending on specific ingredients and brands.   Foods without cholesterol Most plant-based foods do not have cholesterol unless you combine them with a food that has cholesterol. Foods without cholesterol include:  Grains and cereals.  Vegetables.  Fruits.  Vegetable oils, such as olive, canola, and sunflower oil.  Legumes, such as peas, beans, and lentils.  Nuts and seeds.  Egg whites.   Summary  The body needs cholesterol in small amounts, but too much cholesterol can cause damage to the arteries and heart.  Most people should eat less than 200 milligrams (mg) of cholesterol a day. This information is not intended to replace advice given to you by your health care provider. Make sure you discuss any questions you have with your health care provider. Document Revised: 07/15/2019 Document Reviewed: 07/15/2019 Elsevier Patient Education  [place].

## 2020-04-03 DIAGNOSIS — M25561 Pain in right knee: Secondary | ICD-10-CM | POA: Diagnosis not present

## 2020-04-03 DIAGNOSIS — M25562 Pain in left knee: Secondary | ICD-10-CM | POA: Diagnosis not present

## 2020-04-06 DIAGNOSIS — N1832 Chronic kidney disease, stage 3b: Secondary | ICD-10-CM | POA: Diagnosis not present

## 2020-04-08 DIAGNOSIS — M25562 Pain in left knee: Secondary | ICD-10-CM | POA: Diagnosis not present

## 2020-04-08 DIAGNOSIS — M1732 Unilateral post-traumatic osteoarthritis, left knee: Secondary | ICD-10-CM | POA: Diagnosis not present

## 2020-04-13 ENCOUNTER — Other Ambulatory Visit: Payer: Self-pay | Admitting: Nephrology

## 2020-04-13 ENCOUNTER — Other Ambulatory Visit (HOSPITAL_COMMUNITY): Payer: Self-pay | Admitting: Nephrology

## 2020-04-13 DIAGNOSIS — E785 Hyperlipidemia, unspecified: Secondary | ICD-10-CM | POA: Diagnosis not present

## 2020-04-13 DIAGNOSIS — I2510 Atherosclerotic heart disease of native coronary artery without angina pectoris: Secondary | ICD-10-CM | POA: Diagnosis not present

## 2020-04-13 DIAGNOSIS — N281 Cyst of kidney, acquired: Secondary | ICD-10-CM | POA: Diagnosis not present

## 2020-04-13 DIAGNOSIS — I129 Hypertensive chronic kidney disease with stage 1 through stage 4 chronic kidney disease, or unspecified chronic kidney disease: Secondary | ICD-10-CM | POA: Diagnosis not present

## 2020-04-13 DIAGNOSIS — N1832 Chronic kidney disease, stage 3b: Secondary | ICD-10-CM | POA: Diagnosis not present

## 2020-04-13 DIAGNOSIS — C61 Malignant neoplasm of prostate: Secondary | ICD-10-CM | POA: Diagnosis not present

## 2020-04-13 DIAGNOSIS — M1710 Unilateral primary osteoarthritis, unspecified knee: Secondary | ICD-10-CM | POA: Diagnosis not present

## 2020-04-17 ENCOUNTER — Other Ambulatory Visit: Payer: Self-pay

## 2020-04-17 ENCOUNTER — Ambulatory Visit (HOSPITAL_COMMUNITY)
Admission: RE | Admit: 2020-04-17 | Discharge: 2020-04-17 | Disposition: A | Discharge status: Home / Self Care | Payer: Medicare Other | Source: Ambulatory Visit | Attending: Nephrology | Admitting: Nephrology

## 2020-04-17 DIAGNOSIS — N281 Cyst of kidney, acquired: Secondary | ICD-10-CM | POA: Insufficient documentation

## 2020-04-17 DIAGNOSIS — N1832 Chronic kidney disease, stage 3b: Secondary | ICD-10-CM | POA: Insufficient documentation

## 2020-04-17 DIAGNOSIS — N200 Calculus of kidney: Secondary | ICD-10-CM | POA: Diagnosis not present

## 2020-04-17 DIAGNOSIS — N183 Chronic kidney disease, stage 3 unspecified: Secondary | ICD-10-CM | POA: Diagnosis not present

## 2020-04-27 NOTE — Progress Notes (Signed)
CARDIOLOGY OFFICE NOTE  Date:  04/27/2020    [name] [name]. Date of Birth: October 06, 1945 Medical Record #[id number]  PCP:  [name], MD  Cardiologist:  Johnsie Cancel  No chief complaint on file.   History of Present Illness: [name] [name]. is a 75 y.o. male  has a history of HTN, HLD, DM and CAD. He had prior stenting of the LCX in November of 2019. Did have recurrent pain post intervention and was placed on Imdur. Follow up Myoview 02/2018 non ischemic with EF of 60%. Ended up having repeat cath 03/2018 - to manage medically. Has had myalgias on statin - dose has been decreased. Has had chronic dyspnea since his original PCI. This does not appear to be cardiac in nature with Normal CPX 01/07/19    Post right TKR 07/23/19 [facility] No cardiac issues  Seeing [name] for prostate cancer PSA 5.8 On hormonal suppression rx  BP has been high Seeing renal doctor now for stage 3 CRF Dr Albertine Patricia   Has been vaccinated for COVID     Past Medical History:  Diagnosis Date   ABNORMAL EKG 10/07/2008   ADVEF, DRUG/MED/BIOL SUBST, OTHER DRUG NOS 09/26/2006   Agent orange exposure    Allergy    seasonal   Cataract    left eye-surgery 07-2015   DEGENERATIVE JOINT DISEASE, MODERATE 04/13/2007   DIVERTICULOSIS, COLON 04/13/2007   ELEVATED PROSTATE SPECIFIC ANTIGEN 04/14/2007   GERD (gastroesophageal reflux disease)    Glaucoma    sees [name]    History of kidney stones    1970    HYPERLIPIDEMIA 09/26/2006   HYPERTENSION 04/13/2007   JOINT EFFUSION, KNEE 06/15/2009   Melanoma ([place])    sees [name]    Numbness and tingling    PROSTATE CANCER 10/05/2009   sees [name]   Renal insufficiency    stage III [facility]     Past Surgical History:  Procedure Laterality Date   ACROMIOPLASTY Right 05-06-14   per Dr. Macy Mis     bilateral   CATARACT EXTRACTION Left    per [name]    COLONOSCOPY  07/10/2015   per [name], benign polyp, repeat  in 10 yrs    CORONARY STENT INTERVENTION N/A 01/17/2018   Procedure: CORONARY STENT INTERVENTION;  Surgeon: [name], MD;  Location: [place] [facility];  Service: Cardiovascular;  Laterality: N/A;   CYSTECTOMY     benign from hand   GLAUCOMA SURGERY Bilateral 2014    heart stent     INSERTION OF MESH N/A 01/10/2013   Procedure: INSERTION OF MESH;  Surgeon: Earnstine Regal, MD;  Location: [place];  Service: General;  Laterality: N/A;   KNEE ARTHROSCOPY Right Jan. 2013   right knee, per [name]    KNEE ARTHROSCOPY Left 01-14-15   per [name]    LEFT HEART CATH AND CORONARY ANGIOGRAPHY N/A 01/17/2018   Procedure: LEFT HEART CATH AND CORONARY ANGIOGRAPHY;  Surgeon: [name], MD;  Location: [place] [facility];  Service: Cardiovascular;  Laterality: N/A;   LEFT HEART CATH AND CORONARY ANGIOGRAPHY N/A 04/06/2018   Procedure: LEFT HEART CATH AND CORONARY ANGIOGRAPHY;  Surgeon: [name], MD;  Location: [place] [facility];  Service: Cardiovascular;  Laterality: N/A;   MELANOMA EXCISION  2012   per [name]   PROSTATECTOMY     per [name]   TONSILLECTOMY  TOTAL KNEE ARTHROPLASTY Right 07/22/2019   Procedure: TOTAL KNEE ARTHROPLASTY;  Surgeon: Netta Cedars, MD;  Location: [facility];  Service: Orthopedics;  Laterality: Right;   UMBILICAL HERNIA REPAIR  11/01/2011   Procedure: HERNIA REPAIR UMBILICAL ADULT;  Surgeon: Earnstine Regal, MD;  Location: [place];  Service: General;  Laterality: N/A;  Repair umbilical hernia with mesh patch   VASECTOMY     VENTRAL HERNIA REPAIR  01/10/2013   with mesh     [name] HERNIA REPAIR N/A 01/10/2013   Procedure: HERNIA REPAIR VENTRAL ADULT ;  Surgeon: Earnstine Regal, MD;  Location: [place];  Service: General;  Laterality: N/A;     Medications: No outpatient medications have been marked as taking for the 04/30/20 encounter (Appointment) with Josue Hector, MD.     Allergies: No Known  Allergies  Social History: The patient  reports that he quit smoking about 48 years ago. He started smoking about 57 years ago. He has a 9.00 pack-year smoking history. He has never used smokeless tobacco. He reports current alcohol use. He reports that he does not use drugs.   Family History: The patient's family history includes Diabetes in his mother; Heart failure in his father; Prostate cancer in his father; Stroke in his mother.   Review of Systems: Please see the history of present illness.   All other systems are reviewed and negative.   Physical Exam: VS:  There were no vitals taken for this visit. Marland Kitchen  BMI There is no height or weight on file to calculate BMI.  Wt Readings from Last 3 Encounters:  11/14/19 89.5 kg  07/22/19 86.2 kg  07/19/19 84.1 kg   Affect appropriate Healthy:  appears stated age 29: normal Neck supple with no adenopathy JVP normal no bruits no thyromegaly Lungs clear with no wheezing and good diaphragmatic motion Heart:  S1/S2 no murmur, no rub, gallop or click PMI normal Abdomen: benighn, BS positve, no tenderness, no AAA no bruit.  No HSM or HJR Distal pulses intact with no bruits No edema Neuro non-focal Skin warm and dry Post right TKR     LABORATORY DATA:  EKG:  04/30/2020 NSR rate 57 T wave inversions V3-6 new since 05/01/19   Lab Results  Component Value Date   WBC 7.8 11/14/2019   HGB 14.4 11/14/2019   HCT 43.3 11/14/2019   PLT 246 11/14/2019   GLUCOSE 109 (H) 11/14/2019   CHOL 172 11/14/2019   TRIG 154 (H) 11/14/2019   HDL 48 11/14/2019   LDLDIRECT 82.1 04/07/2011   LDLCALC 98 11/14/2019   ALT 13 11/14/2019   AST 18 11/14/2019   NA 142 11/14/2019   K 4.7 11/14/2019   CL 108 11/14/2019   CREATININE 1.58 (H) 11/14/2019   BUN 21 11/14/2019   CO2 24 11/14/2019   TSH 2.75 11/14/2019   PSA 0.38 11/06/2015   HGBA1C 5.9 (H) 11/14/2019     BNP (last 3 results) No results for input(s): BNP in the last 8760  hours.  ProBNP (last 3 results) No results for input(s): PROBNP in the last 8760 hours.   Other Studies Reviewed Today:  Cath 04/06/18  Conclusion    Widely patent mid circumflex drug-eluting stent. The circumflex beyond the stent is unchanged from before with a 50 to 70% distal bifurcation stenosis in a [place] 110 configuration. The circumflex is otherwise patent.  Left main is normal.  LAD is widely patent. A branch of the  first diagonal contains ostial 80% narrowing. This tertiary branches are small.  RCA is widely patent with distal PDA and LV branch diffuse disease.  Normal LV function and LVEDP 10 mmHg.  RECOMMENDATIONS:   It is conceivable that angina is due to the very distal bifurcation disease in the circumflex. It does not appear to be angiographically severe. Stent implantation would include crossing over an equally large side branch. Considering the stability, complexity of an interventional procedure (with risk of side branch occlusion), and the far distal location, recommend continued conservative medical management possibly adding either Ranexa or beta-blocker therapy to the regimen.     ASSESSMENT AND PLAN:  1. CAD: 01/17/18 stent to the prox to mid Cx Single vessel disease with good result. persistent symptoms F/U myovue done 03/01/18 low risk with no ischemia EF 60% continue medical Rx  Repeat Cath 04/06/18 with widely patent stent with medical Rx recommended    2. Ortho:  Post right TKR [facility] 07/23/19   3. Hypertension: poorly controlled add norvasc 10 mg f/u renal/ primary He will fill script at [facility]     4. HLD:  continue statin labs with primary   5. GERD -  Low carb diet protonix   6. DM -  Discussed low carb diet.  Target hemoglobin A1c is 6.5 or less.  Continue current medications.   7. Chronic dyspnea -  CPX 01/07/19 with no cardiopulmonary limitations Stopped due to ventilatory limit due to body habitus   8. Obesity - see above.    9. Prostate Cancer:  F/u [name] has recurrence in prostate bed likely to start hormonal RX cannot Get more radiation in this area F/U PET scan pending   Current medicines are reviewed with the patient today.  The patient does not have concerns regarding medicines other than what has been noted above.  The following changes have been made:  See above.  Labs/ tests ordered today include:   No orders of the defined types were placed in this encounter.    Disposition:   FU in a year .   Signed: Jenkins Rouge, MD  04/27/2020 11:52 AM  [place] [address] [place], [place]  [address] Phone: [phone] Fax: [phone]

## 2020-04-30 ENCOUNTER — Other Ambulatory Visit: Payer: Self-pay

## 2020-04-30 ENCOUNTER — Ambulatory Visit (INDEPENDENT_AMBULATORY_CARE_PROVIDER_SITE_OTHER): Payer: Medicare Other | Admitting: Cardiovascular Disease

## 2020-04-30 ENCOUNTER — Encounter: Payer: Self-pay | Admitting: Cardiovascular Disease

## 2020-04-30 VITALS — BP 152/86 | HR 57 | Ht 66.0 in | Wt 205.0 lb

## 2020-04-30 DIAGNOSIS — I2510 Atherosclerotic heart disease of native coronary artery without angina pectoris: Secondary | ICD-10-CM

## 2020-04-30 DIAGNOSIS — I10 Essential (primary) hypertension: Secondary | ICD-10-CM | POA: Diagnosis not present

## 2020-04-30 MED ORDER — AMLODIPINE BESYLATE 10 MG PO TABS: 10.0000 mg | ORAL_TABLET | Freq: Every day | ORAL | 3 refills | Status: DC

## 2020-04-30 NOTE — Patient Instructions (Addendum)
Medication Instructions:  Your physician has recommended you make the following change in your medication:  1-START Norvasc 10 mg by mouth daily  *If you need a refill on your cardiac medications before your next appointment, please call your pharmacy*  Lab Work: If you have labs (blood work) drawn today and your tests are completely normal, you will receive your results only by: Marland Kitchen MyChart Message (if you have MyChart) OR . A paper copy in the mail If you have any lab test that is abnormal or we need to change your treatment, we will call you to review the results.  Testing/Procedures: None ordered today.  Follow-Up: At [facility], you and your health needs are our priority.  As part of our continuing mission to provide you with exceptional heart care, we have created designated Provider Care Teams.  These Care Teams include your primary Cardiologist (physician) and Advanced Practice Providers (APPs -  Physician Assistants and Nurse Practitioners) who all work together to provide you with the care you need, when you need it.  We recommend signing up for the patient portal called "MyChart".  Sign up information is provided on this After Visit Summary.  MyChart is used to connect with patients for Virtual Visits (Telemedicine).  Patients are able to view lab/test results, encounter notes, upcoming appointments, etc.  Non-urgent messages can be sent to your provider as well.   To learn more about what you can do with MyChart, go to [email or web address].    Your next appointment:   1 year(s)  The format for your next appointment:   In Person  Provider:   You may see Jenkins Rouge, MD or one of the following Advanced Practice Providers on your designated Care Team:    Kathyrn Drown, NP

## 2020-05-13 ENCOUNTER — Other Ambulatory Visit: Payer: Self-pay

## 2020-05-13 ENCOUNTER — Ambulatory Visit (INDEPENDENT_AMBULATORY_CARE_PROVIDER_SITE_OTHER): Payer: Medicare Other | Admitting: Family Medicine

## 2020-05-13 ENCOUNTER — Encounter: Payer: Self-pay | Admitting: Family Medicine

## 2020-05-13 VITALS — BP 128/80 | HR 63 | Temp 98.2°F | Wt 202.2 lb

## 2020-05-13 DIAGNOSIS — I10 Essential (primary) hypertension: Secondary | ICD-10-CM | POA: Diagnosis not present

## 2020-05-13 DIAGNOSIS — M6740 Ganglion, unspecified site: Secondary | ICD-10-CM

## 2020-05-13 DIAGNOSIS — I2510 Atherosclerotic heart disease of native coronary artery without angina pectoris: Secondary | ICD-10-CM

## 2020-05-13 DIAGNOSIS — R739 Hyperglycemia, unspecified: Secondary | ICD-10-CM

## 2020-05-13 DIAGNOSIS — N1831 Chronic kidney disease, stage 3a: Secondary | ICD-10-CM | POA: Diagnosis not present

## 2020-05-13 DIAGNOSIS — M8949 Other hypertrophic osteoarthropathy, multiple sites: Secondary | ICD-10-CM | POA: Diagnosis not present

## 2020-05-13 DIAGNOSIS — E785 Hyperlipidemia, unspecified: Secondary | ICD-10-CM | POA: Diagnosis not present

## 2020-05-13 DIAGNOSIS — M159 Polyosteoarthritis, unspecified: Secondary | ICD-10-CM

## 2020-05-13 LAB — LIPID PANEL
Cholesterol: 163 mg/dL (ref 0–200)
HDL: 43.3 mg/dL (ref >39.00)
LDL Cholesterol: 88 mg/dL (ref 0–99)
NonHDL: 120.13
Total CHOL/HDL Ratio: 4
Triglycerides: 162 mg/dL — ABNORMAL HIGH (ref 0.0–149.0)
VLDL: 32.4 mg/dL (ref 0.0–40.0)

## 2020-05-13 LAB — CBC WITH DIFFERENTIAL/PLATELET
Basophils Absolute: 0.1 10*3/uL (ref 0.0–0.1)
Basophils Relative: 1.2 % (ref 0.0–3.0)
Eosinophils Absolute: 0.2 10*3/uL (ref 0.0–0.7)
Eosinophils Relative: 2.4 % (ref 0.0–5.0)
HCT: 44.3 % (ref 39.0–52.0)
Hemoglobin: 14.8 g/dL (ref 13.0–17.0)
Lymphocytes Relative: 26.4 % (ref 12.0–46.0)
Lymphs Abs: 1.9 10*3/uL (ref 0.7–4.0)
MCHC: 33.4 g/dL (ref 30.0–36.0)
MCV: 89.2 fl (ref 78.0–100.0)
Monocytes Absolute: 0.7 10*3/uL (ref 0.1–1.0)
Monocytes Relative: 9.1 % (ref 3.0–12.0)
Neutro Abs: 4.5 10*3/uL (ref 1.4–7.7)
Neutrophils Relative %: 60.9 % (ref 43.0–77.0)
Platelets: 209 10*3/uL (ref 150.0–400.0)
RBC: 4.96 Mil/uL (ref 4.22–5.81)
RDW: 14.3 % (ref 11.5–15.5)
WBC: 7.3 10*3/uL (ref 4.0–10.5)

## 2020-05-13 LAB — BASIC METABOLIC PANEL
BUN: 21 mg/dL (ref 6–23)
CO2: 25 mEq/L (ref 19–32)
Calcium: 9.9 mg/dL (ref 8.4–10.5)
Chloride: 108 mEq/L (ref 96–112)
Creatinine, Ser: 1.74 mg/dL — ABNORMAL HIGH (ref 0.40–1.50)
GFR: 38.67 mL/min — ABNORMAL LOW (ref >60.00)
Glucose, Bld: 109 mg/dL — ABNORMAL HIGH (ref 70–99)
Potassium: 4.6 mEq/L (ref 3.5–5.1)
Sodium: 142 mEq/L (ref 135–145)

## 2020-05-13 LAB — HEMOGLOBIN A1C: Hgb A1c MFr Bld: 6.7 % — ABNORMAL HIGH (ref 4.6–6.5)

## 2020-05-13 LAB — HEPATIC FUNCTION PANEL
ALT: 21 U/L (ref 0–53)
AST: 21 U/L (ref 0–37)
Albumin: 4.6 g/dL (ref 3.5–5.2)
Alkaline Phosphatase: 45 U/L (ref 39–117)
Bilirubin, Direct: 0.1 mg/dL (ref 0.0–0.3)
Total Bilirubin: 0.7 mg/dL (ref 0.2–1.2)
Total Protein: 7.1 g/dL (ref 6.0–8.3)

## 2020-05-13 LAB — TSH: TSH: 2.43 u[IU]/mL (ref 0.35–4.50)

## 2020-05-13 NOTE — Progress Notes (Signed)
   Subjective:    Patient ID: [name] [name]., male    DOB: 09-02-1945, 75 y.o.   MRN: [id number]  HPI Here to follow up on issues. He is doing well in general, but he asks about a lump on the left wrist that appeared 3 weeks ago. No recent trauma. It is not painful. Otherwise his BP is stable. He sees Nephrology regularly and his renal function is stable. He says he needs a knee replacement on the left but he is putting this off as long as he can.   Review of Systems  Constitutional: Negative.   Respiratory: Negative.   Cardiovascular: Negative.   Gastrointestinal: Negative.   Genitourinary: Negative.   Musculoskeletal: Positive for arthralgias and joint swelling.       Objective:   Physical Exam Constitutional:      Appearance: Normal appearance.     Comments: Walks with a slight limp   Cardiovascular:     Rate and Rhythm: Normal rate and regular rhythm.     Pulses: Normal pulses.     Heart sounds: Normal heart sounds.  Pulmonary:     Effort: Pulmonary effort is normal.     Breath sounds: Normal breath sounds.  Musculoskeletal:     Comments: The flexor surface of the left wrist has a 1 cm smooth round non-tender lump. ROM is full   Neurological:     Mental Status: He is alert.           Assessment & Plan:  His HTN and renal disease and CAD are stable. He has OA in the knee, and he will follow up with Orthopedics about this. He has a ganglion cyst in the wrist. We agreed to simply watch this for awhile since it may resolve on its own. He will let us know if it gets larger or becomes painful. Get fasting labs today to check lipids, an A1c. Etc.  Alysia Penna, MD

## 2020-05-18 ENCOUNTER — Other Ambulatory Visit: Payer: Self-pay

## 2020-05-18 ENCOUNTER — Encounter: Payer: Self-pay | Admitting: Family Medicine

## 2020-05-18 ENCOUNTER — Telehealth: Payer: Self-pay

## 2020-05-18 NOTE — Telephone Encounter (Signed)
We will be on the lookout for this

## 2020-05-18 NOTE — Telephone Encounter (Signed)
Pt surgical clearance form was received and placed on Dr Sarajane Jews folder for completing

## 2020-05-19 NOTE — Telephone Encounter (Signed)
Pt Surgical Clearance form has been received and placed in Dr Sarajane Jews folder for completing

## 2020-05-20 ENCOUNTER — Telehealth: Payer: Self-pay

## 2020-05-20 NOTE — Telephone Encounter (Signed)
Pt Preoperative form was completed and faxed to [facility]

## 2020-06-03 DIAGNOSIS — C61 Malignant neoplasm of prostate: Secondary | ICD-10-CM | POA: Diagnosis not present

## 2020-06-10 ENCOUNTER — Other Ambulatory Visit (HOSPITAL_COMMUNITY): Payer: Self-pay | Admitting: Urology

## 2020-06-10 DIAGNOSIS — C61 Malignant neoplasm of prostate: Secondary | ICD-10-CM | POA: Diagnosis not present

## 2020-06-10 DIAGNOSIS — N393 Stress incontinence (female) (male): Secondary | ICD-10-CM | POA: Diagnosis not present

## 2020-06-10 DIAGNOSIS — R9721 Rising PSA following treatment for malignant neoplasm of prostate: Secondary | ICD-10-CM

## 2020-06-24 ENCOUNTER — Ambulatory Visit (HOSPITAL_COMMUNITY): Admission: RE | Admit: 2020-06-24 | Payer: Medicare Other | Source: Ambulatory Visit

## 2020-06-24 ENCOUNTER — Encounter (HOSPITAL_COMMUNITY): Payer: Self-pay

## 2020-06-25 DIAGNOSIS — Z08 Encounter for follow-up examination after completed treatment for malignant neoplasm: Secondary | ICD-10-CM | POA: Diagnosis not present

## 2020-06-25 DIAGNOSIS — Z85820 Personal history of malignant melanoma of skin: Secondary | ICD-10-CM | POA: Diagnosis not present

## 2020-06-25 DIAGNOSIS — Z1283 Encounter for screening for malignant neoplasm of skin: Secondary | ICD-10-CM | POA: Diagnosis not present

## 2020-06-25 DIAGNOSIS — X32XXXD Exposure to sunlight, subsequent encounter: Secondary | ICD-10-CM | POA: Diagnosis not present

## 2020-06-25 DIAGNOSIS — L570 Actinic keratosis: Secondary | ICD-10-CM | POA: Diagnosis not present

## 2020-06-25 DIAGNOSIS — D225 Melanocytic nevi of trunk: Secondary | ICD-10-CM | POA: Diagnosis not present

## 2020-06-29 ENCOUNTER — Ambulatory Visit (HOSPITAL_COMMUNITY)
Admission: RE | Admit: 2020-06-29 | Discharge: 2020-06-29 | Disposition: A | Discharge status: Home / Self Care | Payer: Medicare Other | Source: Ambulatory Visit | Attending: Urology | Admitting: Urology

## 2020-06-29 ENCOUNTER — Other Ambulatory Visit: Payer: Self-pay

## 2020-06-29 DIAGNOSIS — C61 Malignant neoplasm of prostate: Secondary | ICD-10-CM | POA: Diagnosis not present

## 2020-06-29 DIAGNOSIS — R9721 Rising PSA following treatment for malignant neoplasm of prostate: Secondary | ICD-10-CM | POA: Diagnosis not present

## 2020-06-29 DIAGNOSIS — R972 Elevated prostate specific antigen [PSA]: Secondary | ICD-10-CM | POA: Diagnosis not present

## 2020-06-29 MED ORDER — PIFLIFOLASTAT F 18 (PYLARIFY) INJECTION
9.0000 | Freq: Once | INTRAVENOUS | Status: AC
  Administered 2020-06-29: 8.8 via INTRAVENOUS

## 2020-06-29 NOTE — Progress Notes (Addendum)
Subjective:   [name] Whalin. is a 75 y.o. male who presents for Medicare Annual/Subsequent preventive examination.  Virtual Visit via Video Note  I connected with [name] [name] by a video enabled telemedicine application and verified that I am speaking with the correct person using two identifiers.  Location: Patient: Home Provider: Office Persons participating in the virtual visit: patient, provider   I discussed the limitations of evaluation and management by telemedicine and the availability of in person appointments. The patient expressed understanding and agreed to proceed.     Mickel Baas Emersen Carroll,LPN    Review of Systems    N/A Cardiac Risk Factors include: advanced age (>58men, >62 women);dyslipidemia;hypertension;male gender     Objective:    Today's Vitals   There is no height or weight on file to calculate BMI.  Advanced Directives 06/30/2020 07/22/2019 05/11/2018 04/06/2018 05/09/2017 05/05/2016 07/20/2015  Does Patient Have a Medical Advance Directive? Yes No No No No No No  Type of Paramedic of [place];Living will - - - - - -  Does patient want to make changes to medical advance directive? No - Patient declined - - - - - -  Copy of [place] in Chart? No - copy requested - - - - - -  Would patient like information on creating a medical advance directive? - No - Patient declined Yes (MAU/Ambulatory/Procedural Areas - Information given) No - Patient declined - - No - patient declined information    Current Medications (verified) Outpatient Encounter Medications as of 06/30/2020  Medication Sig  . amLODipine (NORVASC) 10 MG tablet Take 1 tablet (10 mg total) by mouth daily.  Marland Kitchen aspirin 81 MG EC tablet Take 81 mg by mouth daily.  . bimatoprost (LUMIGAN) 0.01 % SOLN Place 1 drop into both eyes at bedtime.  . clopidogrel (PLAVIX) 75 MG tablet Take 1 tablet (75 mg total) by mouth daily with breakfast. (Patient taking differently:  Take 75 mg by mouth daily.)  . dorzolamide-timolol (COSOPT) 22.3-6.8 MG/ML ophthalmic solution Place 1 drop into both eyes 2 (two) times daily.   . Ergocalciferol (VITAMIN D2) 50 MCG (2000 UT) TABS Take 1 tablet by mouth daily.  . famotidine (PEPCID) 20 MG tablet Take 20 mg by mouth at bedtime.   . fenofibrate 160 MG tablet Take 160 mg by mouth at bedtime.   . fluticasone (FLONASE) 50 MCG/ACT nasal spray Place 1 spray into both nostrils daily.  . magnesium oxide (MAG-OX) 400 MG tablet Take 400 mg by mouth every evening.   . pantoprazole (PROTONIX) 40 MG tablet Take 1 tablet (40 mg total) by mouth daily.  . rosuvastatin (CRESTOR) 20 MG tablet Take 20 mg by mouth every evening.   Marland Kitchen telmisartan (MICARDIS) 80 MG tablet Take 1 tablet (80 mg total) by mouth daily.  . isosorbide mononitrate (IMDUR) 30 MG 24 hr tablet Take 1 tablet (30 mg total) by mouth daily.   No facility-administered encounter medications on file as of 06/30/2020.    Allergies (verified) Patient has no known allergies.   History: Past Medical History:  Diagnosis Date  . ABNORMAL EKG 10/07/2008  . ADVEF, DRUG/MED/BIOL SUBST, OTHER DRUG NOS 09/26/2006  . Agent orange exposure   . Allergy    seasonal  . Cataract    left eye-surgery 07-2015  . DEGENERATIVE JOINT DISEASE, MODERATE 04/13/2007  . DIVERTICULOSIS, COLON 04/13/2007  . ELEVATED PROSTATE SPECIFIC ANTIGEN 04/14/2007  . GERD (gastroesophageal reflux disease)   . Glaucoma  sees [name]   . History of kidney stones    1970   . HYPERLIPIDEMIA 09/26/2006  . HYPERTENSION 04/13/2007  . JOINT EFFUSION, KNEE 06/15/2009  . Melanoma [facility])    sees [name]   . Numbness and tingling   . PROSTATE CANCER 10/05/2009   sees [name]  . Renal insufficiency    stage III [facility]    Past Surgical History:  Procedure Laterality Date  . ACROMIOPLASTY Right 05-06-14   per [name]   . BUNIONECTOMY     bilateral  . CATARACT EXTRACTION Left    per [name]   .  COLONOSCOPY  07/10/2015   per [name], benign polyp, repeat in 10 yrs   . CORONARY STENT INTERVENTION N/A 01/17/2018   Procedure: CORONARY STENT INTERVENTION;  Surgeon: [name], MD;  Location: [place] [facility];  Service: Cardiovascular;  Laterality: N/A;  . CYSTECTOMY     benign from hand  . GLAUCOMA SURGERY Bilateral 2014   . heart stent    . INSERTION OF MESH N/A 01/10/2013   Procedure: INSERTION OF MESH;  Surgeon: Earnstine Regal, MD;  Location: [place];  Service: General;  Laterality: N/A;  . KNEE ARTHROSCOPY Right Jan. 2013   right knee, per [name]   . KNEE ARTHROSCOPY Left 01-14-15   per [name]   . LEFT HEART CATH AND CORONARY ANGIOGRAPHY N/A 01/17/2018   Procedure: LEFT HEART CATH AND CORONARY ANGIOGRAPHY;  Surgeon: [name], MD;  Location: [place] [facility];  Service: Cardiovascular;  Laterality: N/A;  . LEFT HEART CATH AND CORONARY ANGIOGRAPHY N/A 04/06/2018   Procedure: LEFT HEART CATH AND CORONARY ANGIOGRAPHY;  Surgeon: [name], MD;  Location: [place] [facility];  Service: Cardiovascular;  Laterality: N/A;  . MELANOMA EXCISION  2012   per [name]  . PROSTATECTOMY     per [name]  . TONSILLECTOMY    . TOTAL KNEE ARTHROPLASTY Right 07/22/2019   Procedure: TOTAL KNEE ARTHROPLASTY;  Surgeon: Netta Cedars, MD;  Location: [facility];  Service: Orthopedics;  Laterality: Right;  . UMBILICAL HERNIA REPAIR  11/01/2011   Procedure: HERNIA REPAIR UMBILICAL ADULT;  Surgeon: Earnstine Regal, MD;  Location: [place];  Service: General;  Laterality: N/A;  Repair umbilical hernia with mesh patch  . VASECTOMY    . VENTRAL HERNIA REPAIR  01/10/2013   with mesh     [name]  . VENTRAL HERNIA REPAIR N/A 01/10/2013   Procedure: HERNIA REPAIR VENTRAL ADULT ;  Surgeon: Earnstine Regal, MD;  Location: [facility] OR;  Service: General;  Laterality: N/A;   Family History  Problem Relation Age of Onset  . Stroke Mother   . Diabetes Mother   . Heart failure  Father   . Prostate cancer Father   . Colon cancer Neg Hx   . Colon polyps Neg Hx   . Esophageal cancer Neg Hx   . Rectal cancer Neg Hx   . Stomach cancer Neg Hx    Social History   Socioeconomic History  . Marital status: Married    Spouse name: Charlett Nose  . Number of children: 2  . Years of education: 69  . Highest education level: High school graduate  Occupational History  . Occupation: Administrator, sports    Comment: retired  Tobacco Use  . Smoking status: Former Smoker    Packs/day: 1.00    Years: 9.00  Pack years: 9.00    Start date: 03/08/1963    Quit date: 03/07/1972    Years since quitting: 48.3  . Smokeless tobacco: Never Used  . Tobacco comment: States AAA completed   Vaping Use  . Vaping Use: Never used  Substance and Sexual Activity  . Alcohol use: Yes    Comment: occasional  . Drug use: Never  . Sexual activity: Not on file  Other Topics Concern  . Not on file  Social History Narrative   Was in the [employer]; Paediatric nurse   Currently can go to the [employer] exposure   Right-handed.   2-3 cups caffeine daily,      Wife; married 40 years   2 children; grands no       05/11/2018:    Lives with wife on one level home   Enjoys going swimming at gym 3 days/week; currently enjoying cardiac rehab   Has one son, one daughter, and one granddaughter who all live in [place]. Travels to see them every other week.      Social Determinants of Health   Financial Resource Strain: Low Risk   . Difficulty of Paying Living Expenses: Not hard at all  Food Insecurity: No Food Insecurity  . Worried About Programme researcher, broadcasting/film/video in the Last Year: Never true  . Ran Out of Food in the Last Year: Never true  Transportation Needs: No Transportation Needs  . Lack of Transportation (Medical): No  . Lack of Transportation (Non-Medical): No  Physical Activity: Not on file  Stress: No Stress Concern Present  . Feeling of Stress : Not at all  Social Connections:  Moderately Integrated  . Frequency of Communication with Friends and Family: More than three times a week  . Frequency of Social Gatherings with Friends and Family: More than three times a week  . Attends Religious Services: Never  . Active Member of Clubs or Organizations: Yes  . Attends Banker Meetings: 1 to 4 times per year  . Marital Status: Married    Tobacco Counseling Counseling given: Not Answered Comment: States AAA completed    Clinical Intake:  Pre-visit preparation completed: Yes  Pain : No/denies pain     Nutritional Risks: None Diabetes: No  How often do you need to have someone help you when you read instructions, pamphlets, or other written materials from your doctor or pharmacy?: 1 - Never What is the last grade level you completed in school?: High School  Diabetic?no   Interpreter Needed?: No  Information entered by :: L.Nicky Milhouse,LPN   Activities of Daily Living In your present state of health, do you have any difficulty performing the following activities: 06/30/2020 07/22/2019  Hearing? N N  Vision? N N  Difficulty concentrating or making decisions? N N  Walking or climbing stairs? N Y  Dressing or bathing? N N  Doing errands, shopping? N N  Preparing Food and eating ? N -  Using the Toilet? N -  In the past six months, have you accidently leaked urine? N -  Do you have problems with loss of bowel control? N -  Managing your Medications? N -  Managing your Finances? N -  Housekeeping or managing your Housekeeping? N -  Some recent data might be hidden    Patient Care Team: Nelwyn Salisbury, MD as PCP - General Wendall Stade, MD as PCP - Cardiology (Cardiology) Bjorn Pippin, MD as Attending Physician (Urology) Dione Booze  [facility] Milus Banister, MD as Attending Physician (Gastroenterology) Helayne Seminole, MD as Consulting Physician (Otolaryngology) Viona Gilmore, [facility] as Pharmacist (Pharmacist)  Indicate  any recent Medical Services you may have received from other than [facility] providers in the past year (date may be approximate).     Assessment:   This is a routine wellness examination for [name] [name].  Hearing/Vision screen  Hearing Screening   125Hz  250Hz  500Hz  1000Hz  2000Hz  3000Hz  4000Hz  6000Hz  8000Hz   Right ear:           Left ear:           Vision Screening Comments: Annual eye exams glaucoma   Dietary issues and exercise activities discussed: Current Exercise Habits: Home exercise routine, Type of exercise: walking, Time (Minutes): 45, Frequency (Times/Week): 5, Weekly Exercise (Minutes/Week): 225, Intensity: Mild, Exercise limited by: orthopedic condition(s)  Goals    . Patient Stated     Get endoscopy, and keep swimming and diving! Safe travels in April!    . Pharmacy Care Plan     CARE PLAN ENTRY (see longitudinal plan of care for additional care plan information)  Current Barriers:  . Chronic Disease Management support, education, and care coordination needs related to Hypertension, Hyperlipidemia, Chronic Kidney Disease, and coronary atherosclerosis, hyperglycemia, and angina class II   Hypertension BP Readings from Last 3 Encounters:  11/14/19 126/80  07/24/19 119/71  07/19/19 (!) 147/81   . Pharmacist Clinical Goal(s): o Over the next 120 days, patient will work with PharmD and providers to maintain BP goal <130/80 . Current regimen:  o telmisartan 80mg , 1 tablet once daily  . Interventions: o Recommend: . Diet modifications. DASH diet:  following a diet emphasizing fruits and vegetables and low-fat dairy products along with whole grains, fish, poultry, and nuts. Reducing red meats and sugars.  . Exercising  . Reducing the amount of salt intake to 1500mg /per day.  . Recommend using a salt substitute to replace your salt if you need flavor.    . Patient self care activities - Over the next 120 days, patient will: o Check BP daily, document, and provide at future  appointments o Ensure daily salt intake < 1500 mg/day  Hyperlipidemia Lab Results  Component Value Date/Time   LDLCALC 98 11/14/2019 09:36 AM   LDLDIRECT 82.1 04/07/2011 08:07 AM   . Pharmacist Clinical Goal(s): o Over the next 120 days, patient will work with PharmD and providers to achieve LDL goal < 70 . Current regimen:  . Rosuvastatin 20mg , 1 tablet every evening . Fenofibrate 160mg , 1 tablet at bedtime  . Interventions: o Recommend . Diet high in plant sterols (fruits/vegetables/nuts/whole grains/legumes) may reduce your cholesterol.  Encouraged increasing fiber to a daily intake of 10-25g/day  . Patient self care activities - Over the next 120 days, patient will: o Work on lifestyle modifications (diet and exercise).  o [facility] and provide [facility] with results  Atherosclerosis of coronary artery . Pharmacist Clinical Goal(s) o Over the next 120 days, patient will work with PharmD and providers to prevent heart attacks/ strokes.  . Current regimen:  . Clopidogrel 75mg , 1 tablet with breakfast  . Rosuvastatin 20mg , 1 tablet every evening  . Interventions: o We discussed monitoring for signs of bleeding such as blood in urine or stool, nosebleeds, and unexpected bruising. . Patient self care activities o Patient will continue current medications as directed by providers.   Angina class II . Pharmacist Clinical Goal(s) o Over  the next 120 days, patient will work with PharmD and providers to control chest pain.  . Current regimen:  . Isosorbide mononitrate 30mg , 1 tablet once daily . Nitroglycerin 0.4mg  SL, 1 tablet under tongue every five minutes as needed for chest pain . Interventions: o Recommend verifying expiration date of nitroglycerin and refilling as needed to maintain up-to-date supply.  . Patient self care activities o Patient will continue current medications as directed by providers.   Hyperglycemia Lab Results  Component  Value Date/Time   HGBA1C 5.9 (H) 11/14/2019 09:36 AM   HGBA1C 6.2 05/20/2019 09:26 AM   . Pharmacist Clinical Goal(s): o Over the next 90 days, patient will work with PharmD and providers to maintain A1c goal <6.5% . Current regimen:  o No medications o Diet and exercise . Interventions: o Recommend - We will focus on managing/monitoring CVD risk factors, including keeping blood pressure and lipids under goal. We discussed modifying lifestyle, including to participate in moderate physical activity (e.g., walking) at least 150 minutes per week. Discussed a Mediterranean eating plan with an emphasis on whole grains, legumes, nuts, fruits, and vegetables and minimal refined and processed foods.  . Patient self care activities - Over the next 120 days, patient will: o Work on lifestyle interventions (diet and exercise).   Chronic kidney disease, stage 3 . Pharmacist Clinical Goal(s) o Over the next 120 days, patient will work with PharmD and providers to maintain kidney function stable.  . Patient self care activities - Over the next 120 days, patient will: o Continue follow up visits with nephrologist every 4 months.  Medication management . Pharmacist Clinical Goal(s): o Over the next 120 days, patient will work with PharmD and providers to maintain optimal medication adherence . [facility] . Interventions o Comprehensive medication review performed. o Continue current medication management strategy . Patient self care activities - Over the next 120 days, patient will: o Take medications as prescribed o Report any questions or concerns to PharmD and/or provider(s)  Please see past updates related to this goal by clicking on the "Past Updates" button in the selected goal      . Weight (lb) < 175 lb (79.4 kg)     Keep going swimming  Check out  online nutrition programs as GumSearch.nl and http://vang.com/; fit53me; Look for foods with "whole"  wheat; bran; oatmeal etc Shot at the farmer's markets in season for fresher choices  Watch for "hydrogenated" on the label of oils which are trans-fats.  Watch for "high fructose corn syrup" in snacks, yogurt or ketchup  Meats have less marbling; bright colored fruits and vegetables;  Canned; dump out liquid and wash vegetables. Be mindful of what we are eating  Portion control is essential to a health weight! Sit down; take a break and enjoy your meal; take smaller bites; put the fork down between bites;  It takes 20 minutes to get full; so check in with your fullness cues and stop eating when you start to fill full             Depression Screen PHQ 2/9 Scores 06/30/2020 06/30/2020 05/20/2019 05/14/2019 05/11/2018 03/28/2018 05/09/2017  PHQ - 2 Score 0 0 0 0 0 0 0  PHQ- 9 Score - - - - 0 - -    Fall Risk Fall Risk  06/30/2020 05/14/2019 05/11/2018 03/22/2018 05/09/2017  Falls in the past year? 0 0 0 0 No  Number falls in past yr: 0 0 - 0 -  Injury with Fall? 0 0 - 0 -  Risk for fall due to : - - Impaired vision - -  Follow up Falls evaluation completed - Education provided;Falls prevention discussed Falls evaluation completed -    FALL RISK PREVENTION PERTAINING TO THE HOME:  Any stairs in or around the home? Yes  If so, are there any without handrails? Yes  Home free of loose throw rugs in walkways, pet beds, electrical cords, etc? Yes  Adequate lighting in your home to reduce risk of falls? Yes   ASSISTIVE DEVICES UTILIZED TO PREVENT FALLS:  Life alert? No  Use of a cane, walker or w/c? No  Grab bars in the bathroom? Yes  Shower chair or bench in shower? Yes  Elevated toilet seat or a handicapped toilet? Yes      Cognitive Function: Normal cognitive status assessed by direct observation by this Nurse Health Advisor. No abnormalities found.   MMSE - Mini Mental State Exam 05/09/2017 05/05/2016  Not completed: (No Data) (No Data)        Immunizations Immunization History   Administered Date(s) Administered  . Fluad Quad(high Dose 65+) 11/13/2018, 11/14/2019  . Influenza Split 04/14/2011  . Influenza Whole 02/18/2009  . Influenza, High Dose Seasonal PF 01/01/2013, 11/06/2014, 11/06/2015, 11/08/2016, 11/07/2017  . Influenza,inj,Quad PF,6+ Mos 10/29/2013  . Moderna Sars-Covid-2 Vaccination 04/05/2019, 05/03/2019  . Pneumococcal Conjugate-13 06/26/2013  . Pneumococcal Polysaccharide-23 05/05/2015  . Td 05/10/2006  . Tdap 02/08/2018  . Zoster 06/13/2011  . Zoster Recombinat (Shingrix) 08/27/2019, 10/30/2019    TDAP status: Up to date  Flu Vaccine status: Up to date  Pneumococcal vaccine status: Up to date  Covid-19 vaccine status: Completed vaccines  Qualifies for Shingles Vaccine? Yes   Zostavax completed No   Shingrix Completed?: Yes  Screening Tests Health Maintenance  Topic Date Due  . COVID-19 Vaccine (3 - Moderna risk 4-dose series) 05/31/2019  . OPHTHALMOLOGY EXAM  04/22/2020  . INFLUENZA VACCINE  10/05/2020  . HEMOGLOBIN A1C  11/13/2020  . COLONOSCOPY (Pts 45-8yrs Insurance coverage will need to be confirmed)  07/19/2025  . TETANUS/TDAP  02/09/2028  . PNA vac Low Risk Adult  Completed  . HPV VACCINES  Aged Out  . FOOT EXAM  Discontinued    Health Maintenance  Health Maintenance Due  Topic Date Due  . COVID-19 Vaccine (3 - Moderna risk 4-dose series) 05/31/2019  . OPHTHALMOLOGY EXAM  04/22/2020    Colorectal cancer screening: Type of screening: Colonoscopy. Completed 07/20/2015. Repeat every 10 years  Lung Cancer Screening: (Low Dose CT Chest recommended if Age 76-80 years, 30 pack-year currently smoking OR have quit w/in 15years.) does not qualify.   Lung Cancer Screening Referral: n/a  Additional Screening:  Hepatitis C Screening: does not qualify  Vision Screening: Recommended annual ophthalmology exams for early detection of glaucoma and other disorders of the eye. Is the patient up to date with their annual eye  exam?  Yes  Who is the provider or what is the name of the office in which the patient attends annual eye exams? Dr Carolynn Sayers If pt is not established with a provider, would they like to be referred to a provider to establish care? No .   Dental Screening: Recommended annual dental exams for proper oral hygiene  Community Resource Referral / Chronic Care Management: CRR required this visit?  No   CCM required this visit?  No      Plan:     I have personally reviewed and noted  the following in the patient's chart:   . Medical and social history . Use of alcohol, tobacco or illicit drugs  . Current medications and supplements . Functional ability and status . Nutritional status . Physical activity . Advanced directives . List of other physicians . Hospitalizations, surgeries, and ER visits in previous 12 months . Vitals . Screenings to include cognitive, depression, and falls . Referrals and appointments  In addition, I have reviewed and discussed with patient certain preventive protocols, quality metrics, and best practice recommendations. A written personalized care plan for preventive services as well as general preventive health recommendations were provided to patient.     Randel Pigg, LPN   [id number]   I have read this note and agree with its contents.  Alysia Penna, MD  Nurse Notes: none

## 2020-06-30 ENCOUNTER — Ambulatory Visit (INDEPENDENT_AMBULATORY_CARE_PROVIDER_SITE_OTHER): Payer: Medicare Other

## 2020-06-30 DIAGNOSIS — Z0000 Encounter for general adult medical examination without abnormal findings: Secondary | ICD-10-CM

## 2020-06-30 NOTE — Patient Instructions (Signed)
[name] [name] , Thank you for taking time to come for your Medicare Wellness Visit. I appreciate your ongoing commitment to your health goals. Please review the following plan we discussed and let me know if I can assist you in the future.   Screening recommendations/referrals: Colonoscopy: current due 12/2025 Recommended yearly ophthalmology/optometry visit for glaucoma screening and checkup Recommended yearly dental visit for hygiene and checkup  Vaccinations: Influenza vaccine: current due fall 2022 Pneumococcal vaccine: completed series  Tdap vaccine: current due 02/09/2028 Shingles vaccine: completed series    Advanced directives: will provide copies   Conditions/risks identified: none   Next appointment: none   Preventive Care 73 Years and Older, Male Preventive care refers to lifestyle choices and visits with your health care provider that can promote health and wellness. What does preventive care include?  A yearly physical exam. This is also called an annual well check.  Dental exams once or twice a year.  Routine eye exams. Ask your health care provider how often you should have your eyes checked.  Personal lifestyle choices, including:  Daily care of your teeth and gums.  Regular physical activity.  Eating a healthy diet.  Avoiding tobacco and drug use.  Limiting alcohol use.  Practicing safe sex.  Taking low doses of aspirin every day.  Taking vitamin and mineral supplements as recommended by your health care provider. What happens during an annual well check? The services and screenings done by your health care provider during your annual well check will depend on your age, overall health, lifestyle risk factors, and family history of disease. Counseling  Your health care provider may ask you questions about your:  Alcohol use.  Tobacco use.  Drug use.  Emotional well-being.  Home and relationship well-being.  Sexual activity.  Eating  habits.  History of falls.  Memory and ability to understand (cognition).  Work and work Statistician. Screening  You may have the following tests or measurements:  Height, weight, and BMI.  Blood pressure.  Lipid and cholesterol levels. These may be checked every 5 years, or more frequently if you are over 73 years old.  Skin check.  Lung cancer screening. You may have this screening every year starting at age 52 if you have a 30-pack-year history of smoking and currently smoke or have quit within the past 15 years.  Fecal occult blood test (FOBT) of the stool. You may have this test every year starting at age 73.  Flexible sigmoidoscopy or colonoscopy. You may have a sigmoidoscopy every 5 years or a colonoscopy every 10 years starting at age 71.  Prostate cancer screening. Recommendations will vary depending on your family history and other risks.  Hepatitis C blood test.  Hepatitis B blood test.  Sexually transmitted disease (STD) testing.  Diabetes screening. This is done by checking your blood sugar (glucose) after you have not eaten for a while (fasting). You may have this done every 1-3 years.  Abdominal aortic aneurysm (AAA) screening. You may need this if you are a current or former smoker.  Osteoporosis. You may be screened starting at age 26 if you are at high risk. Talk with your health care provider about your test results, treatment options, and if necessary, the need for more tests. Vaccines  Your health care provider may recommend certain vaccines, such as:  Influenza vaccine. This is recommended every year.  Tetanus, diphtheria, and acellular pertussis (Tdap, Td) vaccine. You may need a Td booster every 10 years.  Zoster vaccine.  You may need this after age 70.  Pneumococcal 13-valent conjugate (PCV13) vaccine. One dose is recommended after age 42.  Pneumococcal polysaccharide (PPSV23) vaccine. One dose is recommended after age 5. Talk to your health  care provider about which screenings and vaccines you need and how often you need them. This information is not intended to replace advice given to you by your health care provider. Make sure you discuss any questions you have with your health care provider. Document Released: 03/20/2015 Document Revised: 11/11/2015 Document Reviewed: 12/23/2014 Elsevier Interactive Patient Education  2017 [place] Prevention in the Home Falls can cause injuries. They can happen to people of all ages. There are many things you can do to make your home safe and to help prevent falls. What can I do on the outside of my home?  Regularly fix the edges of walkways and driveways and fix any cracks.  Remove anything that might make you trip as you walk through a door, such as a raised step or threshold.  Trim any bushes or trees on the path to your home.  Use bright outdoor lighting.  Clear any walking paths of anything that might make someone trip, such as rocks or tools.  Regularly check to see if handrails are loose or broken. Make sure that both sides of any steps have handrails.  Any raised decks and porches should have guardrails on the edges.  Have any leaves, snow, or ice cleared regularly.  Use sand or salt on walking paths during winter.  Clean up any spills in your garage right away. This includes oil or grease spills. What can I do in the bathroom?  Use night lights.  Install grab bars by the toilet and in the tub and shower. Do not use towel bars as grab bars.  Use non-skid mats or decals in the tub or shower.  If you need to sit down in the shower, use a plastic, non-slip stool.  Keep the floor dry. Clean up any water that spills on the floor as soon as it happens.  Remove soap buildup in the tub or shower regularly.  Attach bath mats securely with double-sided non-slip rug tape.  Do not have throw rugs and other things on the floor that can make you trip. What can I do  in the bedroom?  Use night lights.  Make sure that you have a light by your bed that is easy to reach.  Do not use any sheets or blankets that are too big for your bed. They should not hang down onto the floor.  Have a firm chair that has side arms. You can use this for support while you get dressed.  Do not have throw rugs and other things on the floor that can make you trip. What can I do in the kitchen?  Clean up any spills right away.  Avoid walking on wet floors.  Keep items that you use a lot in easy-to-reach places.  If you need to reach something above you, use a strong step stool that has a grab bar.  Keep electrical cords out of the way.  Do not use floor polish or wax that makes floors slippery. If you must use wax, use non-skid floor wax.  Do not have throw rugs and other things on the floor that can make you trip. What can I do with my stairs?  Do not leave any items on the stairs.  Make sure that there are handrails on  both sides of the stairs and use them. Fix handrails that are broken or loose. Make sure that handrails are as long as the stairways.  Check any carpeting to make sure that it is firmly attached to the stairs. Fix any carpet that is loose or worn.  Avoid having throw rugs at the top or bottom of the stairs. If you do have throw rugs, attach them to the floor with carpet tape.  Make sure that you have a light switch at the top of the stairs and the bottom of the stairs. If you do not have them, ask someone to add them for you. What else can I do to help prevent falls?  Wear shoes that:  Do not have high heels.  Have rubber bottoms.  Are comfortable and fit you well.  Are closed at the toe. Do not wear sandals.  If you use a stepladder:  Make sure that it is fully opened. Do not climb a closed stepladder.  Make sure that both sides of the stepladder are locked into place.  Ask someone to hold it for you, if possible.  Clearly mark  and make sure that you can see:  Any grab bars or handrails.  First and last steps.  Where the edge of each step is.  Use tools that help you move around (mobility aids) if they are needed. These include:  Canes.  Walkers.  Scooters.  Crutches.  Turn on the lights when you go into a dark area. Replace any light bulbs as soon as they burn out.  Set up your furniture so you have a clear path. Avoid moving your furniture around.  If any of your floors are uneven, fix them.  If there are any pets around you, be aware of where they are.  Review your medicines with your doctor. Some medicines can make you feel dizzy. This can increase your chance of falling. Ask your doctor what other things that you can do to help prevent falls. This information is not intended to replace advice given to you by your health care provider. Make sure you discuss any questions you have with your health care provider. Document Released: 12/18/2008 Document Revised: 07/30/2015 Document Reviewed: 03/28/2014 Elsevier Interactive Patient Education  2017 [employer].

## 2020-07-02 NOTE — H&P (Signed)
Patient's anticipated LOS is less than 2 midnights, meeting these requirements: - Younger than 64 - Lives within 1 hour of care - Has a competent adult at home to recover with post-op recover - NO history of  - Chronic pain requiring opiods  - Diabetes  - Coronary Artery Disease  - Heart failure  - Heart attack  - Stroke  - DVT/VTE  - Cardiac arrhythmia  - Respiratory Failure/COPD  - Renal failure  - Anemia  - Advanced Liver disease       [name] [name]. is an 75 y.o. male.    Chief Complaint: left knee pain  HPI: Pt is a 75 y.o. male complaining of left knee pain for multiple years. Pain had continually increased since the beginning. X-rays in the clinic show end-stage arthritic changes of the left knee. Pt has tried various conservative treatments which have failed to alleviate their symptoms, including injections and therapy. Various options are discussed with the patient. Risks, benefits and expectations were discussed with the patient. Patient understand the risks, benefits and expectations and wishes to proceed with surgery.   PCP:  [name], MD  D/C Plans: Home  PMH: Past Medical History:  Diagnosis Date  . ABNORMAL EKG 10/07/2008  . ADVEF, DRUG/MED/BIOL SUBST, OTHER DRUG NOS 09/26/2006  . Agent orange exposure   . Allergy    seasonal  . Cataract    left eye-surgery 07-2015  . DEGENERATIVE JOINT DISEASE, MODERATE 04/13/2007  . DIVERTICULOSIS, COLON 04/13/2007  . ELEVATED PROSTATE SPECIFIC ANTIGEN 04/14/2007  . GERD (gastroesophageal reflux disease)   . Glaucoma    sees [name]   . History of kidney stones    1970   . HYPERLIPIDEMIA 09/26/2006  . HYPERTENSION 04/13/2007  . JOINT EFFUSION, KNEE 06/15/2009  . Melanoma [facility])    sees [name]   . Numbness and tingling   . PROSTATE CANCER 10/05/2009   sees [name]  . Renal insufficiency    stage III [facility]     PSH: Past Surgical History:  Procedure Laterality Date  . ACROMIOPLASTY Right  05-06-14   per [name]   . BUNIONECTOMY     bilateral  . CATARACT EXTRACTION Left    per [name]   . COLONOSCOPY  07/10/2015   per [name], benign polyp, repeat in 10 yrs   . CORONARY STENT INTERVENTION N/A 01/17/2018   Procedure: CORONARY STENT INTERVENTION;  Surgeon: [name], MD;  Location: [place];  Service: Cardiovascular;  Laterality: N/A;  . CYSTECTOMY     benign from hand  . GLAUCOMA SURGERY Bilateral 2014   . heart stent    . INSERTION OF MESH N/A 01/10/2013   Procedure: INSERTION OF MESH;  Surgeon: Earnstine Regal, MD;  Location: [facility];  Service: General;  Laterality: N/A;  . KNEE ARTHROSCOPY Right Jan. 2013   right knee, per [name]   . KNEE ARTHROSCOPY Left 01-14-15   per [name]   . LEFT HEART CATH AND CORONARY ANGIOGRAPHY N/A 01/17/2018   Procedure: LEFT HEART CATH AND CORONARY ANGIOGRAPHY;  Surgeon: [name], MD;  Location: [facility];  Service: Cardiovascular;  Laterality: N/A;  . LEFT HEART CATH AND CORONARY ANGIOGRAPHY N/A 04/06/2018   Procedure: LEFT HEART CATH AND CORONARY ANGIOGRAPHY;  Surgeon: [name], MD;  Location: [place] [facility];  Service: Cardiovascular;  Laterality: N/A;  . MELANOMA EXCISION  2012   per  [name]  . PROSTATECTOMY     per [name]  . TONSILLECTOMY    . TOTAL KNEE ARTHROPLASTY Right 07/22/2019   Procedure: TOTAL KNEE ARTHROPLASTY;  Surgeon: Netta Cedars, MD;  Location: [facility];  Service: Orthopedics;  Laterality: Right;  . UMBILICAL HERNIA REPAIR  11/01/2011   Procedure: HERNIA REPAIR UMBILICAL ADULT;  Surgeon: Earnstine Regal, MD;  Location: [facility];  Service: General;  Laterality: N/A;  Repair umbilical hernia with mesh patch  . VASECTOMY    . VENTRAL HERNIA REPAIR  01/10/2013   with mesh     [name]  . VENTRAL HERNIA REPAIR N/A 01/10/2013   Procedure: HERNIA REPAIR VENTRAL ADULT ;  Surgeon: Earnstine Regal, MD;  Location: [facility];  Service: General;  Laterality: N/A;     Social History:  reports that he quit smoking about 48 years ago. He started smoking about 57 years ago. He has a 9.00 pack-year smoking history. He has never used smokeless tobacco. He reports current alcohol use. He reports that he does not use drugs.  Allergies:  No Known Allergies  Medications: No current facility-administered medications for this encounter.   Current Outpatient Medications  Medication Sig Dispense Refill  . amLODipine (NORVASC) 10 MG tablet Take 1 tablet (10 mg total) by mouth daily. 90 tablet 3  . aspirin 81 MG EC tablet Take 81 mg by mouth daily.    . bimatoprost (LUMIGAN) 0.01 % SOLN Place 1 drop into both eyes at bedtime.    . clopidogrel (PLAVIX) 75 MG tablet Take 1 tablet (75 mg total) by mouth daily with breakfast. (Patient taking differently: Take 75 mg by mouth daily.) 90 tablet 8  . dorzolamide-timolol (COSOPT) 22.3-6.8 MG/ML ophthalmic solution Place 1 drop into both eyes 2 (two) times daily.     . Ergocalciferol (VITAMIN D2) 50 MCG (2000 UT) TABS Take 2,000 Units by mouth daily.    . famotidine (PEPCID) 20 MG tablet Take 20 mg by mouth at bedtime.     . fenofibrate 160 MG tablet Take 160 mg by mouth at bedtime.     . fluticasone (FLONASE) 50 MCG/ACT nasal spray Place 1 spray into both nostrils daily as needed for allergies.    . isosorbide mononitrate (IMDUR) 30 MG 24 hr tablet Take 30 mg by mouth daily.    . magnesium oxide (MAG-OX) 400 MG tablet Take 400 mg by mouth every evening.     . nitroGLYCERIN (NITROSTAT) 0.4 MG SL tablet Place 0.4 mg under the tongue every 5 (five) minutes as needed for chest pain.    . pantoprazole (PROTONIX) 40 MG tablet Take 1 tablet (40 mg total) by mouth daily. 90 tablet 3  . rosuvastatin (CRESTOR) 20 MG tablet Take 20 mg by mouth every evening.     Marland Kitchen telmisartan (MICARDIS) 80 MG tablet Take 1 tablet (80 mg total) by mouth daily. 90 tablet 4    No results found for this or any previous visit (from the past 48  hour(s)). No results found.  ROS: Pain with rom of the left lower extremity  Physical Exam: Alert and oriented 75 y.o. male in no acute distress Cranial nerves 2-12 intact Cervical spine: full rom with no tenderness, nv intact distally Chest: active breath sounds bilaterally, no wheeze rhonchi or rales Heart: regular rate and rhythm, no murmur Abd: non tender non distended with active bowel sounds Hip is stable with rom  Left knee medial and lateral joint line tenderness  Crepitus with rom nv intact distally No rashes or edema anatalgic gait  Assessment/Plan Assessment: left knee end stage ostoearthritis  Plan:  Patient will undergo a left total knee by [name] at [place] benefits and expectations were discussed with the patient. Patient understand risks, benefits and expectations and wishes to proceed. Preoperative templating of the joint replacement has been completed, documented, and submitted to the Operating Room personnel in order to optimize intra-operative equipment management.   Merla Riches PA-C, MPAS [facility] is now [employer] [address], [place], [place], [place] [address] Phone: [phone] [email or web address] Facebook  [employer]

## 2020-07-02 NOTE — Progress Notes (Signed)
Sent message, via epic in basket, requesting orders in epic from surgeon.  

## 2020-07-07 DIAGNOSIS — Z96651 Presence of right artificial knee joint: Secondary | ICD-10-CM | POA: Diagnosis not present

## 2020-07-07 DIAGNOSIS — Z4789 Encounter for other orthopedic aftercare: Secondary | ICD-10-CM | POA: Diagnosis not present

## 2020-07-07 DIAGNOSIS — M1732 Unilateral post-traumatic osteoarthritis, left knee: Secondary | ICD-10-CM | POA: Diagnosis not present

## 2020-07-08 NOTE — Patient Instructions (Addendum)
DUE TO COVID-19 ONLY ONE VISITOR IS ALLOWED TO COME WITH YOU AND STAY IN THE WAITING ROOM ONLY DURING PRE OP AND PROCEDURE.   **NO VISITORS ARE ALLOWED IN THE SHORT STAY AREA OR RECOVERY ROOM!!**  IF YOU WILL BE ADMITTED INTO THE HOSPITAL YOU ARE ALLOWED ONLY TWO SUPPORT PEOPLE DURING VISITATION HOURS ONLY (10AM -8PM)   . The support person(s) may change daily. . The support person(s) must pass our screening, gel in and out, and wear a mask at all times, including in the patient's room. . Patients must also wear a mask when staff or their support person are in the room.  No visitors under the age of 3. Any visitor under the age of 38 must be accompanied by an adult.        Your procedure is scheduled on:  Friday, 07-10-20   Report to [facility] Main  Entrance    Report to admitting at 1:30 PM   Call this number if you have problems the morning of surgery [phone]   Do not eat food :After Midnight.   May have liquids until 1:00 PM  day of surgery  CLEAR LIQUID DIET  Foods Allowed                                                                     Foods Excluded  Water, Black Coffee and tea, regular and decaf             liquids that you cannot  Plain Jell-O in any flavor  (No red)                                   see through such as: Fruit ices (not with fruit pulp)                                      milk, soups, orange juice              Iced Popsicles (No red)                                      All solid food                                   Apple juices Sports drinks like Gatorade (No red) Lightly seasoned clear broth or consume(fat free) Sugar, honey syrup    Oral Hygiene is also important to reduce your risk of infection.                                    Remember - BRUSH YOUR TEETH THE MORNING OF SURGERY WITH YOUR REGULAR TOOTHPASTE   Do NOT smoke after Midnight   Take these medicines the morning of surgery with A SIP OF WATER:  Amlodipine, Imdur,  Pantoprazole  You may not have any metal on your body including  jewelry, and body piercings             Do not wear lotions, powders, cologne, or deodorant             Men may shave face and neck.   Do not bring valuables to the hospital. [place].   Contacts, dentures or bridgework may not be worn into surgery.   Bring small overnight bag day of surgery.                 Please read over the following fact sheets you were given: IF YOU HAVE QUESTIONS ABOUT YOUR PRE OP INSTRUCTIONS PLEASE CALL  [facility] - Preparing for Surgery Before surgery, you can play an important role.  Because skin is not sterile, your skin needs to be as free of germs as possible.  You can reduce the number of germs on your skin by washing with CHG (chlorahexidine gluconate) soap before surgery.  CHG is an antiseptic cleaner which kills germs and bonds with the skin to continue killing germs even after washing. Please DO NOT use if you have an allergy to CHG or antibacterial soaps.  If your skin becomes reddened/irritated stop using the CHG and inform your nurse when you arrive at Short Stay. Do not shave (including legs and underarms) for at least 48 hours prior to the first CHG shower.  You may shave your face/neck.  Please follow these instructions carefully:  1.  Shower with CHG Soap the night before surgery and the  morning of surgery.  2.  If you choose to wash your hair, wash your hair first as usual with your normal  shampoo.  3.  After you shampoo, rinse your hair and body thoroughly to remove the shampoo.                             4.  Use CHG as you would any other liquid soap.  You can apply chg directly to the skin and wash.  Gently with a scrungie or clean washcloth.  5.  Apply the CHG Soap to your body ONLY FROM THE NECK DOWN.   Do   not use on face/ open                           Wound or open sores. Avoid  contact with eyes, ears mouth and   genitals (private parts).                       Wash face,  Genitals (private parts) with your normal soap.             6.  Wash thoroughly, paying special attention to the area where your    surgery  will be performed.  7.  Thoroughly rinse your body with warm water from the neck down.  8.  DO NOT shower/wash with your normal soap after using and rinsing off the CHG Soap.                9.  Pat yourself dry with a clean towel.            10.  Wear clean pajamas.            11.  Place  clean sheets on your bed the night of your first shower and do not  sleep with pets. Day of Surgery : Do not apply any lotions/deodorants the morning of surgery.  Please wear clean clothes to the hospital/surgery center.  FAILURE TO FOLLOW THESE INSTRUCTIONS MAY RESULT IN THE CANCELLATION OF YOUR SURGERY  PATIENT SIGNATURE_________________________________  NURSE SIGNATURE__________________________________  ________________________________________________________________________   [name] [name]  An incentive spirometer is a tool that can help keep your lungs clear and active. This tool measures how well you are filling your lungs with each breath. Taking long deep breaths may help reverse or decrease the chance of developing breathing (pulmonary) problems (especially infection) following:  A long period of time when you are unable to move or be active. BEFORE THE PROCEDURE   If the spirometer includes an indicator to show your best effort, your nurse or respiratory therapist will set it to a desired goal.  If possible, sit up straight or lean slightly forward. Try not to slouch.  Hold the incentive spirometer in an upright position. INSTRUCTIONS FOR USE  1. Sit on the edge of your bed if possible, or sit up as far as you can in bed or on a chair. 2. Hold the incentive spirometer in an upright position. 3. Breathe out normally. 4. Place the mouthpiece in your  mouth and seal your lips tightly around it. 5. Breathe in slowly and as deeply as possible, raising the piston or the ball toward the top of the column. 6. Hold your breath for 3-5 seconds or for as long as possible. Allow the piston or ball to fall to the bottom of the column. 7. Remove the mouthpiece from your mouth and breathe out normally. 8. Rest for a few seconds and repeat Steps 1 through 7 at least 10 times every 1-2 hours when you are awake. Take your time and take a few normal breaths between deep breaths. 9. The spirometer may include an indicator to show your best effort. Use the indicator as a goal to work toward during each repetition. 10. After each set of 10 deep breaths, practice coughing to be sure your lungs are clear. If you have an incision (the cut made at the time of surgery), support your incision when coughing by placing a pillow or rolled up towels firmly against it. Once you are able to get out of bed, walk around indoors and cough well. You may stop using the incentive spirometer when instructed by your caregiver.  RISKS AND COMPLICATIONS  Take your time so you do not get dizzy or light-headed.  If you are in pain, you may need to take or ask for pain medication before doing incentive spirometry. It is harder to take a deep breath if you are having pain. AFTER USE  Rest and breathe slowly and easily.  It can be helpful to keep track of a log of your progress. Your caregiver can provide you with a simple table to help with this. If you are using the spirometer at home, follow these instructions: [place] IF:   You are having difficultly using the spirometer.  You have trouble using the spirometer as often as instructed.  Your pain medication is not giving enough relief while using the spirometer.  You develop fever of 100.5 F (38.1 C) or higher. SEEK IMMEDIATE MEDICAL CARE IF:   You cough up bloody sputum that had not been present before.  You  develop fever of 102 F (38.9 C) or greater.  You develop worsening pain at or near the incision site. MAKE SURE YOU:   Understand these instructions.  Will watch your condition.  Will get help right away if you are not doing well or get worse. Document Released: 07/04/2006 Document Revised: 05/16/2011 Document Reviewed: 09/04/2006 [facility] Patient Information 2014 [place], [place].   ________________________________________________________________________

## 2020-07-08 NOTE — Progress Notes (Addendum)
COVID Vaccine Completed: x2 Date COVID Vaccine completed: 04-05-19, 05-03-19 Has received booster: COVID vaccine manufacturer:     Moderna     Date of COVID positive in last 90 days:  N/A  PCP - Alysia Penna, MD Cardiologist - Jenkins Rouge, MD  Chest x-ray - N/A EKG - 04-30-20 Epic Stress Test - 01-07-19 Epic ECHO - 06-22-18 Epic Cardiac Cath - 04-06-18 Epic Pacemaker/ICD device last checked: Spinal Cord Stimulator: Telemetry monitoring - 02-05-18 Epic CT Coronary analysis 02-02-18 Epic  Sleep Study - N/A CPAP -   Fasting Blood Sugar - N/A Checks Blood Sugar _____ times a day  Blood Thinner Instructions:  Plavix 75 mg. Last dose 07-07-20 Aspirin Instructions:  ASA 81 mg Last Dose: 07-07-20  Activity level:  Can go up a flight of stairs and perform activities of daily living without stopping and without symptoms of chest pain or shortness of breath.  Pt states that he lives in a home with stairs and goes up an down them multiple times a day    Anesthesia review: CAD with stenting, HTN, pre diabetes DM, chronic dyspnea  Patient denies shortness of breath, fever, cough and chest pain at PAT appointment   Patient verbalized understanding of instructions that were given to them at the PAT appointment. Patient was also instructed that they will need to review over the PAT instructions again at home before surgery.

## 2020-07-09 ENCOUNTER — Other Ambulatory Visit: Payer: Self-pay

## 2020-07-09 ENCOUNTER — Other Ambulatory Visit (HOSPITAL_COMMUNITY)
Admission: RE | Admit: 2020-07-09 | Discharge: 2020-07-09 | Disposition: A | Discharge status: Home / Self Care | Payer: Medicare Other | Source: Ambulatory Visit | Attending: Orthopedic Surgery | Admitting: Orthopedic Surgery

## 2020-07-09 ENCOUNTER — Encounter (HOSPITAL_COMMUNITY)
Admission: RE | Admit: 2020-07-09 | Discharge: 2020-07-09 | Disposition: A | Discharge status: Home / Self Care | Payer: Medicare Other | Source: Ambulatory Visit | Attending: Orthopedic Surgery | Admitting: Orthopedic Surgery

## 2020-07-09 ENCOUNTER — Encounter (HOSPITAL_COMMUNITY): Payer: Self-pay

## 2020-07-09 DIAGNOSIS — N183 Chronic kidney disease, stage 3 unspecified: Secondary | ICD-10-CM | POA: Insufficient documentation

## 2020-07-09 DIAGNOSIS — M1712 Unilateral primary osteoarthritis, left knee: Secondary | ICD-10-CM | POA: Insufficient documentation

## 2020-07-09 DIAGNOSIS — Z01812 Encounter for preprocedural laboratory examination: Secondary | ICD-10-CM | POA: Insufficient documentation

## 2020-07-09 DIAGNOSIS — Z87891 Personal history of nicotine dependence: Secondary | ICD-10-CM | POA: Insufficient documentation

## 2020-07-09 DIAGNOSIS — Z7902 Long term (current) use of antithrombotics/antiplatelets: Secondary | ICD-10-CM | POA: Diagnosis not present

## 2020-07-09 DIAGNOSIS — K219 Gastro-esophageal reflux disease without esophagitis: Secondary | ICD-10-CM | POA: Diagnosis not present

## 2020-07-09 DIAGNOSIS — I129 Hypertensive chronic kidney disease with stage 1 through stage 4 chronic kidney disease, or unspecified chronic kidney disease: Secondary | ICD-10-CM | POA: Insufficient documentation

## 2020-07-09 DIAGNOSIS — Z79899 Other long term (current) drug therapy: Secondary | ICD-10-CM | POA: Diagnosis not present

## 2020-07-09 DIAGNOSIS — Z7982 Long term (current) use of aspirin: Secondary | ICD-10-CM | POA: Diagnosis not present

## 2020-07-09 DIAGNOSIS — Z20822 Contact with and (suspected) exposure to covid-19: Secondary | ICD-10-CM | POA: Insufficient documentation

## 2020-07-09 DIAGNOSIS — I2510 Atherosclerotic heart disease of native coronary artery without angina pectoris: Secondary | ICD-10-CM | POA: Insufficient documentation

## 2020-07-09 DIAGNOSIS — Z7901 Long term (current) use of anticoagulants: Secondary | ICD-10-CM | POA: Diagnosis not present

## 2020-07-09 HISTORY — DX: Prediabetes: R73.03

## 2020-07-09 LAB — BASIC METABOLIC PANEL
Anion gap: 10 (ref 5–15)
BUN: 24 mg/dL — ABNORMAL HIGH (ref 8–23)
CO2: 19 mmol/L — ABNORMAL LOW (ref 22–32)
Calcium: 9.8 mg/dL (ref 8.9–10.3)
Chloride: 111 mmol/L (ref 98–111)
Creatinine, Ser: 1.76 mg/dL — ABNORMAL HIGH (ref 0.61–1.24)
GFR, Estimated: 41 mL/min — ABNORMAL LOW (ref >60)
Glucose, Bld: 96 mg/dL (ref 70–99)
Potassium: 3.9 mmol/L (ref 3.5–5.1)
Sodium: 140 mmol/L (ref 135–145)

## 2020-07-09 LAB — CBC
HCT: 43.5 % (ref 39.0–52.0)
Hemoglobin: 14.4 g/dL (ref 13.0–17.0)
MCH: 29.9 pg (ref 26.0–34.0)
MCHC: 33.1 g/dL (ref 30.0–36.0)
MCV: 90.4 fL (ref 80.0–100.0)
Platelets: 251 10*3/uL (ref 150–400)
RBC: 4.81 MIL/uL (ref 4.22–5.81)
RDW: 13.4 % (ref 11.5–15.5)
WBC: 9 10*3/uL (ref 4.0–10.5)
nRBC: 0 % (ref 0.0–0.2)

## 2020-07-09 LAB — SURGICAL PCR SCREEN
MRSA, PCR: NEGATIVE
Staphylococcus aureus: NEGATIVE

## 2020-07-09 MED ORDER — BUPIVACAINE LIPOSOME 1.3 % IJ SUSP
20.0000 mL | Freq: Once | INTRAMUSCULAR | Status: DC
  Filled 2020-07-09: qty 20

## 2020-07-09 NOTE — Progress Notes (Signed)
Anesthesia Chart Review   Case: [id number] Date/Time: 07/10/20 1547   Procedure: TOTAL KNEE ARTHROPLASTY (Left Knee)   Anesthesia type: Spinal   Pre-op diagnosis: left knee osteoarthrits   Location: [place] [facility] / [facility]   Surgeons: Netta Cedars, MD      DISCUSSION:75 y.o. former smoker with h/o HTN, GERD, CAD (stent 2019), prostate cancer, CKD Stage III followed by [name], left knee OA scheduled for above procedure 07/10/2020 with Dr. Netta Cedars.   Pt last seen by cardiology 04/30/2020. Per OV note, "01/17/18 stent to the prox to mid Cx Single vessel disease with good result. persistent symptoms F/U myovue done 03/01/18 low risk with no ischemia EF 60% continue medical Rx Repeat Cath 04/06/18 with widely patent stent with medical Rx recommended".  Pt stable at this visit with 1 year follow up recommended.   Anticipate pt can proceed with planned procedure barring acute status change.   VS: BP (!) 149/85   Pulse 67   Temp 36.8 C (Oral)   Resp 20   Ht 5\' 6"  (1.676 m)   Wt 89.9 kg   SpO2 99%   BMI 31.99 kg/m   PROVIDERS: [name], MD is PCP   Jenkins Rouge, MD is Cardiologist  LABS: Labs reviewed: Acceptable for surgery. (all labs ordered are listed, but only abnormal results are displayed)  Labs Reviewed  SURGICAL PCR SCREEN  BASIC METABOLIC PANEL  CBC     IMAGES:   EKG: 04/30/2020 Rate 57 bpm  NSR  CV: Echo 06/22/2018 IMPRESSIONS    1. The left ventricle has normal systolic function with an ejection  fraction of 60-65%. The cavity size was normal. Left ventricular diastolic  Doppler parameters are indeterminate.  2. The right ventricle has normal systolic function. The cavity was  normal. There is no increase in right ventricular wall thickness.  3. The aortic valve is tricuspid. Mild thickening of the aortic valve.  Aortic valve regurgitation is mild by color flow Doppler.   Cardiac Cath 04/06/2018  Widely patent mid circumflex drug-eluting  stent.  The circumflex beyond the stent is unchanged from before with a 50 to 70% distal bifurcation stenosis in a Medina 110 configuration.  The circumflex is otherwise patent.  Left main is normal.  LAD is widely patent.  A branch of the first diagonal contains ostial 80% narrowing.  This tertiary branches are small.  RCA is widely patent with distal PDA and LV branch diffuse disease.  Normal LV function and LVEDP 10 mmHg.  RECOMMENDATIONS:   It is conceivable that angina is due to the very distal bifurcation disease in the circumflex.  It does not appear to be angiographically severe.  Stent implantation would include crossing over an equally large side branch.  Considering the stability, complexity of an interventional procedure (with risk of side branch occlusion), and the far distal location, recommend continued conservative medical management possibly adding either Ranexa or beta-blocker therapy to the regimen.   Past Medical History:  Diagnosis Date  . ABNORMAL EKG 10/07/2008  . ADVEF, DRUG/MED/BIOL SUBST, OTHER DRUG NOS 09/26/2006  . Agent orange exposure   . Allergy    seasonal  . Cataract    left eye-surgery 07-2015  . DEGENERATIVE JOINT DISEASE, MODERATE 04/13/2007  . DIVERTICULOSIS, COLON 04/13/2007  . ELEVATED PROSTATE SPECIFIC ANTIGEN 04/14/2007  . GERD (gastroesophageal reflux disease)   . Glaucoma    sees [name]   . History of kidney stones    1970   .  HYPERLIPIDEMIA 09/26/2006  . HYPERTENSION 04/13/2007  . JOINT EFFUSION, KNEE 06/15/2009  . Melanoma [facility])    sees [name]   . Numbness and tingling   . Pre-diabetes   . PROSTATE CANCER 10/05/2009   sees [name]  . Renal insufficiency    stage III [facility]     Past Surgical History:  Procedure Laterality Date  . ACROMIOPLASTY Right 05-06-14   per [name]   . BUNIONECTOMY     bilateral  . CATARACT EXTRACTION Left    per [name]   . COLONOSCOPY  07/10/2015   per [name], benign polyp,  repeat in 10 yrs   . CORONARY STENT INTERVENTION N/A 01/17/2018   Procedure: CORONARY STENT INTERVENTION;  Surgeon: [name], MD;  Location: [place] [facility];  Service: Cardiovascular;  Laterality: N/A;  . CYSTECTOMY     benign from hand  . GLAUCOMA SURGERY Bilateral 2014   . heart stent    . INSERTION OF MESH N/A 01/10/2013   Procedure: INSERTION OF MESH;  Surgeon: Earnstine Regal, MD;  Location: [place];  Service: General;  Laterality: N/A;  . KNEE ARTHROSCOPY Right Jan. 2013   right knee, per [name]   . KNEE ARTHROSCOPY Left 01-14-15   per [name]   . LEFT HEART CATH AND CORONARY ANGIOGRAPHY N/A 01/17/2018   Procedure: LEFT HEART CATH AND CORONARY ANGIOGRAPHY;  Surgeon: [name], MD;  Location: [place] [facility];  Service: Cardiovascular;  Laterality: N/A;  . LEFT HEART CATH AND CORONARY ANGIOGRAPHY N/A 04/06/2018   Procedure: LEFT HEART CATH AND CORONARY ANGIOGRAPHY;  Surgeon: [name], MD;  Location: [place] [facility];  Service: Cardiovascular;  Laterality: N/A;  . MELANOMA EXCISION  2012   per [name]  . PROSTATECTOMY     per [name]  . TONSILLECTOMY    . TOTAL KNEE ARTHROPLASTY Right 07/22/2019   Procedure: TOTAL KNEE ARTHROPLASTY;  Surgeon: Netta Cedars, MD;  Location[facility];  Service: Orthopedics;  Laterality: Right;  . UMBILICAL HERNIA REPAIR  11/01/2011   Procedure: HERNIA REPAIR UMBILICAL ADULT;  Surgeon: Earnstine Regal, MD;  Location: [place];  Service: General;  Laterality: N/A;  Repair umbilical hernia with mesh patch  . VASECTOMY    . VENTRAL HERNIA REPAIR  01/10/2013   with mesh     [name]  . VENTRAL HERNIA REPAIR N/A 01/10/2013   Procedure: HERNIA REPAIR VENTRAL ADULT ;  Surgeon: Earnstine Regal, MD;  Location: [place];  Service: General;  Laterality: N/A;    MEDICATIONS: . amLODipine (NORVASC) 10 MG tablet  . aspirin 81 MG EC tablet  . bimatoprost (LUMIGAN) 0.01 % SOLN  . clopidogrel (PLAVIX) 75 MG tablet  .  dorzolamide-timolol (COSOPT) 22.3-6.8 MG/ML ophthalmic solution  . Ergocalciferol (VITAMIN D2) 50 MCG (2000 UT) TABS  . famotidine (PEPCID) 20 MG tablet  . fenofibrate 160 MG tablet  . fluticasone (FLONASE) 50 MCG/ACT nasal spray  . isosorbide mononitrate (IMDUR) 30 MG 24 hr tablet  . magnesium oxide (MAG-OX) 400 MG tablet  . nitroGLYCERIN (NITROSTAT) 0.4 MG SL tablet  . pantoprazole (PROTONIX) 40 MG tablet  . rosuvastatin (CRESTOR) 20 MG tablet  . telmisartan (MICARDIS) 80 MG tablet   No current facility-administered medications for this encounter.   Derrill Memo ON 07/10/2020] bupivacaine liposome (EXPAREL) 1.3 % injection 266 mg    Konrad Felix, PA-C [facility] Pre-Surgical Testing [phone]

## 2020-07-10 ENCOUNTER — Observation Stay (HOSPITAL_COMMUNITY)
Admission: RE | Admit: 2020-07-10 | Discharge: 2020-07-11 | Disposition: A | Discharge status: Home / Self Care | Payer: Medicare Other | Attending: Orthopedic Surgery | Admitting: Orthopedic Surgery

## 2020-07-10 ENCOUNTER — Telehealth: Payer: Medicare Other

## 2020-07-10 ENCOUNTER — Ambulatory Visit (HOSPITAL_COMMUNITY): Payer: Medicare Other | Admitting: Anesthesiology

## 2020-07-10 ENCOUNTER — Encounter (HOSPITAL_COMMUNITY)
Admission: RE | Disposition: A | Discharge status: Home / Self Care | Payer: Self-pay | Source: Home / Self Care | Attending: Orthopedic Surgery

## 2020-07-10 ENCOUNTER — Ambulatory Visit (HOSPITAL_COMMUNITY): Payer: Medicare Other | Admitting: Physician Assistant

## 2020-07-10 ENCOUNTER — Encounter (HOSPITAL_COMMUNITY): Payer: Self-pay | Admitting: Orthopedic Surgery

## 2020-07-10 DIAGNOSIS — R7303 Prediabetes: Secondary | ICD-10-CM | POA: Diagnosis not present

## 2020-07-10 DIAGNOSIS — Z96651 Presence of right artificial knee joint: Secondary | ICD-10-CM | POA: Insufficient documentation

## 2020-07-10 DIAGNOSIS — I10 Essential (primary) hypertension: Secondary | ICD-10-CM | POA: Diagnosis not present

## 2020-07-10 DIAGNOSIS — Z8546 Personal history of malignant neoplasm of prostate: Secondary | ICD-10-CM | POA: Diagnosis not present

## 2020-07-10 DIAGNOSIS — Z7982 Long term (current) use of aspirin: Secondary | ICD-10-CM | POA: Insufficient documentation

## 2020-07-10 DIAGNOSIS — M1712 Unilateral primary osteoarthritis, left knee: Principal | ICD-10-CM | POA: Insufficient documentation

## 2020-07-10 DIAGNOSIS — Z87891 Personal history of nicotine dependence: Secondary | ICD-10-CM | POA: Diagnosis not present

## 2020-07-10 DIAGNOSIS — N183 Chronic kidney disease, stage 3 unspecified: Secondary | ICD-10-CM | POA: Diagnosis not present

## 2020-07-10 DIAGNOSIS — G8918 Other acute postprocedural pain: Secondary | ICD-10-CM | POA: Diagnosis not present

## 2020-07-10 DIAGNOSIS — Z79899 Other long term (current) drug therapy: Secondary | ICD-10-CM | POA: Diagnosis not present

## 2020-07-10 DIAGNOSIS — I129 Hypertensive chronic kidney disease with stage 1 through stage 4 chronic kidney disease, or unspecified chronic kidney disease: Secondary | ICD-10-CM | POA: Diagnosis not present

## 2020-07-10 DIAGNOSIS — Z96652 Presence of left artificial knee joint: Secondary | ICD-10-CM

## 2020-07-10 HISTORY — PX: TOTAL KNEE ARTHROPLASTY: SHX125

## 2020-07-10 LAB — SARS CORONAVIRUS 2 (TAT 6-24 HRS): SARS Coronavirus 2: NEGATIVE

## 2020-07-10 SURGERY — ARTHROPLASTY, KNEE, TOTAL
Anesthesia: Spinal | Site: Knee | Laterality: Left

## 2020-07-10 MED ORDER — METOCLOPRAMIDE HCL 5 MG/ML IJ SOLN: 5.0000 mg | Freq: Three times a day (TID) | INTRAMUSCULAR | Status: DC | PRN

## 2020-07-10 MED ORDER — CEFAZOLIN SODIUM-DEXTROSE 2-4 GM/100ML-% IV SOLN
2.0000 g | Freq: Four times a day (QID) | INTRAVENOUS | Status: AC
  Administered 2020-07-10 – 2020-07-11 (×2): 2 g via INTRAVENOUS
  Filled 2020-07-10 (×2): qty 100

## 2020-07-10 MED ORDER — METHOCARBAMOL 500 MG IVPB - SIMPLE MED
INTRAVENOUS | Status: AC
  Administered 2020-07-10: 500 mg via INTRAVENOUS
  Filled 2020-07-10: qty 50

## 2020-07-10 MED ORDER — MIDAZOLAM HCL 2 MG/2ML IJ SOLN
INTRAMUSCULAR | Status: AC
  Administered 2020-07-10: 1 mg via INTRAVENOUS
  Filled 2020-07-10: qty 2

## 2020-07-10 MED ORDER — ISOSORBIDE MONONITRATE ER 30 MG PO TB24
30.0000 mg | ORAL_TABLET | Freq: Every day | ORAL | Status: DC
  Administered 2020-07-11: 30 mg via ORAL
  Filled 2020-07-10: qty 1

## 2020-07-10 MED ORDER — WATER FOR IRRIGATION, STERILE IR SOLN
Status: DC | PRN
Start: 2020-07-10 — End: 2020-07-10
  Administered 2020-07-10: 2000 mL

## 2020-07-10 MED ORDER — TRANEXAMIC ACID-NACL 1000-0.7 MG/100ML-% IV SOLN
1000.0000 mg | Freq: Once | INTRAVENOUS | Status: AC
  Administered 2020-07-10: 1000 mg via INTRAVENOUS
  Filled 2020-07-10: qty 100

## 2020-07-10 MED ORDER — PHENOL 1.4 % MT LIQD: 1.0000 | OROMUCOSAL | Status: DC | PRN

## 2020-07-10 MED ORDER — METHOCARBAMOL 500 MG IVPB - SIMPLE MED
500.0000 mg | Freq: Four times a day (QID) | INTRAVENOUS | Status: DC | PRN
  Filled 2020-07-10: qty 50

## 2020-07-10 MED ORDER — VITAMIN D2 50 MCG (2000 UT) PO TABS: 2000.0000 [IU] | ORAL_TABLET | Freq: Every day | ORAL | Status: DC

## 2020-07-10 MED ORDER — TRANEXAMIC ACID-NACL 1000-0.7 MG/100ML-% IV SOLN
1000.0000 mg | INTRAVENOUS | Status: AC
  Administered 2020-07-10: 1000 mg via INTRAVENOUS
  Filled 2020-07-10: qty 100

## 2020-07-10 MED ORDER — FAMOTIDINE 20 MG PO TABS
20.0000 mg | ORAL_TABLET | Freq: Every day | ORAL | Status: DC
  Administered 2020-07-10: 20 mg via ORAL
  Filled 2020-07-10: qty 1

## 2020-07-10 MED ORDER — HYDROMORPHONE HCL 1 MG/ML IJ SOLN: 0.2500 mg | INTRAMUSCULAR | Status: DC | PRN

## 2020-07-10 MED ORDER — PANTOPRAZOLE SODIUM 40 MG PO TBEC
40.0000 mg | DELAYED_RELEASE_TABLET | Freq: Every day | ORAL | Status: DC
  Administered 2020-07-11: 40 mg via ORAL
  Filled 2020-07-10: qty 1

## 2020-07-10 MED ORDER — BUPIVACAINE LIPOSOME 1.3 % IJ SUSP
INTRAMUSCULAR | Status: DC | PRN
  Administered 2020-07-10: 20 mL

## 2020-07-10 MED ORDER — ASPIRIN 81 MG PO TBEC: 81.0000 mg | DELAYED_RELEASE_TABLET | Freq: Two times a day (BID) | ORAL | 0 refills | Status: AC

## 2020-07-10 MED ORDER — ONDANSETRON HCL 4 MG/2ML IJ SOLN
INTRAMUSCULAR | Status: DC | PRN
  Administered 2020-07-10: 4 mg via INTRAVENOUS

## 2020-07-10 MED ORDER — ONDANSETRON HCL 4 MG PO TABS: 4.0000 mg | ORAL_TABLET | Freq: Every day | ORAL | 1 refills | Status: DC | PRN

## 2020-07-10 MED ORDER — OXYCODONE HCL 5 MG PO TABS
5.0000 mg | ORAL_TABLET | ORAL | Status: DC | PRN
  Administered 2020-07-10 – 2020-07-11 (×4): 10 mg via ORAL
  Filled 2020-07-10 (×4): qty 2

## 2020-07-10 MED ORDER — BISACODYL 10 MG RE SUPP: 10.0000 mg | Freq: Every day | RECTAL | Status: DC | PRN

## 2020-07-10 MED ORDER — MAGNESIUM OXIDE -MG SUPPLEMENT 400 (240 MG) MG PO TABS
400.0000 mg | ORAL_TABLET | Freq: Every day | ORAL | Status: DC
  Administered 2020-07-10: 400 mg via ORAL
  Filled 2020-07-10: qty 1

## 2020-07-10 MED ORDER — FENTANYL CITRATE (PF) 100 MCG/2ML IJ SOLN
INTRAMUSCULAR | Status: AC
  Administered 2020-07-10: 50 ug via INTRAVENOUS
  Filled 2020-07-10: qty 2

## 2020-07-10 MED ORDER — VITAMIN D 25 MCG (1000 UNIT) PO TABS
2000.0000 [IU] | ORAL_TABLET | Freq: Every day | ORAL | Status: DC
  Administered 2020-07-11: 2000 [IU] via ORAL
  Filled 2020-07-10 (×2): qty 2

## 2020-07-10 MED ORDER — EPHEDRINE SULFATE-NACL 50-0.9 MG/10ML-% IV SOSY
PREFILLED_SYRINGE | INTRAVENOUS | Status: DC | PRN
  Administered 2020-07-10: 15 mg via INTRAVENOUS
  Administered 2020-07-10 (×2): 10 mg via INTRAVENOUS

## 2020-07-10 MED ORDER — POLYETHYLENE GLYCOL 3350 17 G PO PACK: 17.0000 g | PACK | Freq: Every day | ORAL | Status: DC | PRN

## 2020-07-10 MED ORDER — MAGNESIUM OXIDE 400 MG PO TABS: 400.0000 mg | ORAL_TABLET | Freq: Every evening | ORAL | Status: DC

## 2020-07-10 MED ORDER — METHOCARBAMOL 500 MG PO TABS: 500.0000 mg | ORAL_TABLET | Freq: Three times a day (TID) | ORAL | 1 refills | Status: DC | PRN

## 2020-07-10 MED ORDER — MIDAZOLAM HCL 2 MG/2ML IJ SOLN: 0.5000 mg | Freq: Once | INTRAMUSCULAR | Status: DC | PRN

## 2020-07-10 MED ORDER — BUPIVACAINE-EPINEPHRINE 0.25% -1:200000 IJ SOLN
INTRAMUSCULAR | Status: DC | PRN
  Administered 2020-07-10: 30 mL

## 2020-07-10 MED ORDER — ROSUVASTATIN CALCIUM 20 MG PO TABS
20.0000 mg | ORAL_TABLET | Freq: Every evening | ORAL | Status: DC
  Administered 2020-07-10: 20 mg via ORAL
  Filled 2020-07-10: qty 1

## 2020-07-10 MED ORDER — DOCUSATE SODIUM 100 MG PO CAPS
100.0000 mg | ORAL_CAPSULE | Freq: Two times a day (BID) | ORAL | Status: DC
  Administered 2020-07-10 – 2020-07-11 (×2): 100 mg via ORAL
  Filled 2020-07-10 (×2): qty 1

## 2020-07-10 MED ORDER — ASPIRIN 81 MG PO CHEW
81.0000 mg | CHEWABLE_TABLET | Freq: Two times a day (BID) | ORAL | Status: DC
  Administered 2020-07-10 – 2020-07-11 (×2): 81 mg via ORAL
  Filled 2020-07-10 (×2): qty 1

## 2020-07-10 MED ORDER — LIDOCAINE 2% (20 MG/ML) 5 ML SYRINGE
INTRAMUSCULAR | Status: DC | PRN
  Administered 2020-07-10: 40 mg via INTRAVENOUS

## 2020-07-10 MED ORDER — HYDROMORPHONE HCL 1 MG/ML IJ SOLN
0.5000 mg | INTRAMUSCULAR | Status: DC | PRN
  Administered 2020-07-10 – 2020-07-11 (×2): 1 mg via INTRAVENOUS
  Filled 2020-07-10 (×2): qty 1

## 2020-07-10 MED ORDER — BUPIVACAINE-EPINEPHRINE (PF) 0.25% -1:200000 IJ SOLN
INTRAMUSCULAR | Status: AC
  Filled 2020-07-10: qty 30

## 2020-07-10 MED ORDER — AMLODIPINE BESYLATE 10 MG PO TABS
10.0000 mg | ORAL_TABLET | Freq: Every day | ORAL | Status: DC
  Administered 2020-07-11: 10 mg via ORAL
  Filled 2020-07-10: qty 1

## 2020-07-10 MED ORDER — NITROGLYCERIN 0.4 MG SL SUBL: 0.4000 mg | SUBLINGUAL_TABLET | SUBLINGUAL | Status: DC | PRN

## 2020-07-10 MED ORDER — LACTATED RINGERS IV SOLN: INTRAVENOUS | Status: DC

## 2020-07-10 MED ORDER — IRBESARTAN 150 MG PO TABS
300.0000 mg | ORAL_TABLET | Freq: Every day | ORAL | Status: DC
  Administered 2020-07-10 – 2020-07-11 (×2): 300 mg via ORAL
  Filled 2020-07-10 (×2): qty 2

## 2020-07-10 MED ORDER — PROPOFOL 500 MG/50ML IV EMUL
INTRAVENOUS | Status: DC | PRN
  Administered 2020-07-10: 100 ug/kg/min via INTRAVENOUS

## 2020-07-10 MED ORDER — ROPIVACAINE HCL 7.5 MG/ML IJ SOLN
INTRAMUSCULAR | Status: DC | PRN
  Administered 2020-07-10: 20 mL via PERINEURAL

## 2020-07-10 MED ORDER — SODIUM CHLORIDE 0.9 % IV SOLN: INTRAVENOUS | Status: DC

## 2020-07-10 MED ORDER — METOCLOPRAMIDE HCL 5 MG PO TABS: 5.0000 mg | ORAL_TABLET | Freq: Three times a day (TID) | ORAL | Status: DC | PRN

## 2020-07-10 MED ORDER — ONDANSETRON HCL 4 MG PO TABS: 4.0000 mg | ORAL_TABLET | Freq: Four times a day (QID) | ORAL | Status: DC | PRN

## 2020-07-10 MED ORDER — CLOPIDOGREL BISULFATE 75 MG PO TABS
75.0000 mg | ORAL_TABLET | Freq: Every day | ORAL | Status: DC
  Administered 2020-07-11: 75 mg via ORAL
  Filled 2020-07-10: qty 1

## 2020-07-10 MED ORDER — FLUTICASONE PROPIONATE 50 MCG/ACT NA SUSP
1.0000 | Freq: Every day | NASAL | Status: DC | PRN
  Filled 2020-07-10: qty 16

## 2020-07-10 MED ORDER — OXYCODONE-ACETAMINOPHEN 5-325 MG PO TABS: 1.0000 | ORAL_TABLET | ORAL | 0 refills | Status: DC | PRN

## 2020-07-10 MED ORDER — ACETAMINOPHEN 325 MG PO TABS: 325.0000 mg | ORAL_TABLET | Freq: Four times a day (QID) | ORAL | Status: DC | PRN

## 2020-07-10 MED ORDER — ACETAMINOPHEN 500 MG PO TABS
1000.0000 mg | ORAL_TABLET | Freq: Once | ORAL | Status: AC
  Administered 2020-07-10: 1000 mg via ORAL
  Filled 2020-07-10: qty 2

## 2020-07-10 MED ORDER — METHOCARBAMOL 500 MG PO TABS
500.0000 mg | ORAL_TABLET | Freq: Four times a day (QID) | ORAL | Status: DC | PRN
  Administered 2020-07-10 – 2020-07-11 (×3): 500 mg via ORAL
  Filled 2020-07-10 (×4): qty 1

## 2020-07-10 MED ORDER — DORZOLAMIDE HCL-TIMOLOL MAL 2-0.5 % OP SOLN
1.0000 [drp] | Freq: Two times a day (BID) | OPHTHALMIC | Status: DC
  Administered 2020-07-10 – 2020-07-11 (×2): 1 [drp] via OPHTHALMIC
  Filled 2020-07-10: qty 10

## 2020-07-10 MED ORDER — CHLORHEXIDINE GLUCONATE 0.12 % MT SOLN
15.0000 mL | Freq: Once | OROMUCOSAL | Status: AC
  Administered 2020-07-10: 15 mL via OROMUCOSAL

## 2020-07-10 MED ORDER — ONDANSETRON HCL 4 MG/2ML IJ SOLN: 4.0000 mg | Freq: Four times a day (QID) | INTRAMUSCULAR | Status: DC | PRN

## 2020-07-10 MED ORDER — SODIUM CHLORIDE (PF) 0.9 % IJ SOLN
INTRAMUSCULAR | Status: AC
  Filled 2020-07-10: qty 30

## 2020-07-10 MED ORDER — MENTHOL 3 MG MT LOZG: 1.0000 | LOZENGE | OROMUCOSAL | Status: DC | PRN

## 2020-07-10 MED ORDER — LATANOPROST 0.005 % OP SOLN
1.0000 [drp] | Freq: Every day | OPHTHALMIC | Status: DC
  Administered 2020-07-10: 1 [drp] via OPHTHALMIC
  Filled 2020-07-10: qty 2.5

## 2020-07-10 MED ORDER — SODIUM CHLORIDE 0.9 % IR SOLN
Status: DC | PRN
  Administered 2020-07-10: 1000 mL

## 2020-07-10 MED ORDER — ORAL CARE MOUTH RINSE: 15.0000 mL | Freq: Once | OROMUCOSAL | Status: AC

## 2020-07-10 MED ORDER — POVIDONE-IODINE 10 % EX SWAB
2.0000 | Freq: Once | CUTANEOUS | Status: AC
Start: 2020-07-10 — End: 2020-07-10
  Administered 2020-07-10: 2 via TOPICAL

## 2020-07-10 MED ORDER — SODIUM CHLORIDE (PF) 0.9 % IJ SOLN
INTRAMUSCULAR | Status: DC | PRN
  Administered 2020-07-10: 30 mL

## 2020-07-10 MED ORDER — OXYCODONE HCL 5 MG/5ML PO SOLN: 5.0000 mg | Freq: Once | ORAL | Status: DC | PRN

## 2020-07-10 MED ORDER — CEFAZOLIN SODIUM-DEXTROSE 2-4 GM/100ML-% IV SOLN
2.0000 g | INTRAVENOUS | Status: AC
  Administered 2020-07-10: 2 g via INTRAVENOUS
  Filled 2020-07-10: qty 100

## 2020-07-10 MED ORDER — FENTANYL CITRATE (PF) 100 MCG/2ML IJ SOLN: 50.0000 ug | INTRAMUSCULAR | Status: DC

## 2020-07-10 MED ORDER — PROMETHAZINE HCL 25 MG/ML IJ SOLN: 6.2500 mg | INTRAMUSCULAR | Status: DC | PRN

## 2020-07-10 MED ORDER — BUPIVACAINE IN DEXTROSE 0.75-8.25 % IT SOLN
INTRATHECAL | Status: DC | PRN
  Administered 2020-07-10: 13.5 mg via INTRATHECAL

## 2020-07-10 MED ORDER — FENOFIBRATE 160 MG PO TABS
160.0000 mg | ORAL_TABLET | Freq: Every day | ORAL | Status: DC
  Administered 2020-07-10: 160 mg via ORAL
  Filled 2020-07-10: qty 1

## 2020-07-10 MED ORDER — MIDAZOLAM HCL 2 MG/2ML IJ SOLN: 1.0000 mg | INTRAMUSCULAR | Status: DC

## 2020-07-10 MED ORDER — OXYCODONE HCL 5 MG PO TABS: 5.0000 mg | ORAL_TABLET | Freq: Once | ORAL | Status: DC | PRN

## 2020-07-10 SURGICAL SUPPLY — 55 items
ATTUNE MED DOME PAT 38 KNEE (Knees) ×1 IMPLANT
ATTUNE PS FEM LT SZ 6 CEM KNEE (Femur) ×1 IMPLANT
ATTUNE PSRP INSR SZ6 10 KNEE (Insert) ×1 IMPLANT
BAG SPEC THK2 15X12 ZIP CLS (MISCELLANEOUS) ×1
BAG ZIPLOCK 12X15 (MISCELLANEOUS) ×1 IMPLANT
BASE TIBIAL ROT PLAT SZ 7 KNEE (Knees) IMPLANT
BLADE SAG 18X100X1.27 (BLADE) ×2 IMPLANT
BLADE SAW SGTL 13X75X1.27 (BLADE) ×2 IMPLANT
BNDG CMPR MED 10X6 ELC LF (GAUZE/BANDAGES/DRESSINGS) ×1
BNDG ELASTIC 6X10 VLCR STRL LF (GAUZE/BANDAGES/DRESSINGS) ×2 IMPLANT
BNDG GAUZE ELAST 4 BULKY (GAUZE/BANDAGES/DRESSINGS) ×2 IMPLANT
BOWL SMART MIX CTS (DISPOSABLE) ×2 IMPLANT
BSPLAT TIB 7 CMNT ROT PLAT STR (Knees) ×1 IMPLANT
CEMENT HV SMART SET (Cement) ×4 IMPLANT
COVER SURGICAL LIGHT HANDLE (MISCELLANEOUS) ×2 IMPLANT
COVER WAND RF STERILE (DRAPES) IMPLANT
CUFF TOURN SGL QUICK 34 (TOURNIQUET CUFF) ×2
CUFF TRNQT CYL 34X4.125X (TOURNIQUET CUFF) ×1 IMPLANT
DRAPE SHEET LG 3/4 BI-LAMINATE (DRAPES) ×2 IMPLANT
DRAPE U-SHAPE 47X51 STRL (DRAPES) ×2 IMPLANT
DRSG ADAPTIC 3X8 NADH LF (GAUZE/BANDAGES/DRESSINGS) ×2 IMPLANT
DRSG PAD ABDOMINAL 8X10 ST (GAUZE/BANDAGES/DRESSINGS) ×2 IMPLANT
DURAPREP 26ML APPLICATOR (WOUND CARE) ×2 IMPLANT
ELECT REM PT RETURN 15FT ADLT (MISCELLANEOUS) ×2 IMPLANT
GAUZE SPONGE 4X4 12PLY STRL (GAUZE/BANDAGES/DRESSINGS) ×2 IMPLANT
GLOVE BIOGEL PI ORTHO PRO 7.5 (GLOVE) ×1
GLOVE PI ORTHO PRO STRL 7.5 (GLOVE) ×1 IMPLANT
GLOVE SURG ORTHO LTX SZ7.5 (GLOVE) ×2 IMPLANT
GLOVE SURG ORTHO LTX SZ8.5 (GLOVE) ×4 IMPLANT
GLOVE SURG POLY ORTHO LF SZ8 (GLOVE) ×2 IMPLANT
GOWN STRL REUS W/TWL XL LVL3 (GOWN DISPOSABLE) ×4 IMPLANT
HANDPIECE INTERPULSE COAX TIP (DISPOSABLE) ×2
HOLDER FOLEY CATH W/STRAP (MISCELLANEOUS) ×1 IMPLANT
IMMOBILIZER KNEE 20 (SOFTGOODS) ×2
IMMOBILIZER KNEE 20 THIGH 36 (SOFTGOODS) IMPLANT
KIT TURNOVER KIT A (KITS) ×2 IMPLANT
MANIFOLD NEPTUNE II (INSTRUMENTS) ×2 IMPLANT
NS IRRIG 1000ML POUR BTL (IV SOLUTION) ×2 IMPLANT
PACK TOTAL KNEE CUSTOM (KITS) ×2 IMPLANT
PENCIL SMOKE EVACUATOR (MISCELLANEOUS) IMPLANT
PIN DRILL FIX HALF THREAD (BIT) ×1 IMPLANT
PIN STEINMAN FIXATION KNEE (PIN) ×1 IMPLANT
PROTECTOR NERVE ULNAR (MISCELLANEOUS) ×2 IMPLANT
SET HNDPC FAN SPRY TIP SCT (DISPOSABLE) ×1 IMPLANT
STAPLER VISISTAT 35W (STAPLE) IMPLANT
STRIP CLOSURE SKIN 1/2X4 (GAUZE/BANDAGES/DRESSINGS) ×4 IMPLANT
SUT MNCRL AB 3-0 PS2 18 (SUTURE) ×2 IMPLANT
SUT VIC AB 0 CT1 36 (SUTURE) ×3 IMPLANT
SUT VIC AB 1 CT1 36 (SUTURE) ×5 IMPLANT
SUT VIC AB 2-0 CT1 27 (SUTURE) ×4
SUT VIC AB 2-0 CT1 TAPERPNT 27 (SUTURE) ×1 IMPLANT
TIBIAL BASE ROT PLAT SZ 7 KNEE (Knees) ×2 IMPLANT
TRAY FOLEY MTR SLVR 16FR STAT (SET/KITS/TRAYS/PACK) ×2 IMPLANT
WATER STERILE IRR 1000ML POUR (IV SOLUTION) ×4 IMPLANT
WRAP KNEE MAXI GEL POST OP (GAUZE/BANDAGES/DRESSINGS) ×1 IMPLANT

## 2020-07-10 NOTE — Brief Op Note (Signed)
07/10/2020  3:37 PM  PATIENT:  [name] [name].  75 y.o. male  PRE-OPERATIVE DIAGNOSIS:  left knee osteoarthritis, end stage  POST-OPERATIVE DIAGNOSIS:  left knee osteoarthritis, end stage  PROCEDURE:  Procedure(s): TOTAL KNEE ARTHROPLASTY (Left)  SURGEON:  Surgeon(s) and Role:    Netta Cedars, MD - Primary  PHYSICIAN ASSISTANT:   ASSISTANTS: Ventura Bruns, PA-C   ANESTHESIA:   regional and spinal  EBL:  25 mL   BLOOD ADMINISTERED:none  DRAINS: none   LOCAL MEDICATIONS USED:  MARCAINE     SPECIMEN:  No Specimen  DISPOSITION OF SPECIMEN:  N/A  COUNTS:  YES  TOURNIQUET:  * Missing tourniquet times found for documented tourniquets in log: [id number] *  DICTATION: .Other Dictation: Dictation Number [id number]  PLAN OF CARE: Admit for overnight observation  PATIENT DISPOSITION:  PACU - hemodynamically stable.   Delay start of Pharmacological VTE agent (>24hrs) due to surgical blood loss or risk of bleeding: no

## 2020-07-10 NOTE — Anesthesia Procedure Notes (Signed)
Spinal  Patient location during procedure: OR End time: 07/10/2020 1:48 PM Reason for block: surgical anesthesia Staffing Performed: anesthesiologist  Anesthesiologist: Annye Asa, MD Preanesthetic Checklist Completed: patient identified, IV checked, site marked, risks and benefits discussed, surgical consent, monitors and equipment checked, pre-op evaluation and timeout performed Spinal Block Patient position: sitting Prep: DuraPrep and site prepped and draped Patient monitoring: blood pressure, continuous pulse ox, cardiac monitor and heart rate Approach: midline Location: L3-4 Injection technique: single-shot Needle Needle type: Pencan and Introducer  Needle gauge: 24 G Needle length: 9 cm Assessment Events: CSF return Additional Notes Pt identified in Operating room.  Monitors applied. Working IV access confirmed. Sterile prep, drape lumbar spine.  1% lido local L 3,4.  #24ga Pencan into clear CSF L 3,4.  13.5mg  0.75% Bupivacaine with dextrose injected with asp CSF beginning and end of injection.  Patient asymptomatic, VSS, no heme aspirated, tolerated well.  Jenita Seashore, MD

## 2020-07-10 NOTE — Progress Notes (Signed)
Assisted Dr. Carswell Jackson with left, ultrasound guided, adductor canal block. Side rails up, monitors on throughout procedure. See vital signs in flow sheet. Tolerated Procedure well.  

## 2020-07-10 NOTE — Anesthesia Preprocedure Evaluation (Addendum)
Anesthesia Evaluation  Patient identified by MRN, date of birth, ID band Patient awake    Reviewed: Allergy & Precautions, NPO status , Patient's Chart, lab work & pertinent test results  History of Anesthesia Complications Negative for: history of anesthetic complications  Airway Mallampati: II  TM Distance: >3 FB Neck ROM: Full    Dental  (+) Dental Advisory Given, Caps   Pulmonary former smoker,  07/09/2020 SARS Coronavirus NEG   breath sounds clear to auscultation       Cardiovascular hypertension, Pt. on medications (-) angina+ CAD and + Cardiac Stents (2019)   Rhythm:Regular Rate:Normal  03/2020 Cath:  Widely patent mid circumflex drug-eluting stent.  The circumflex beyond the stent is unchanged from before with a 50 to 70% distal bifurcation stenosis in a Medina 110 configuration.  The circumflex is otherwise patent. Left main is normal. LAD is widely patent.  A branch of the first diagonal contains ostial 80% narrowing.  This tertiary branches are small. RCA is widely patent with distal PDA and LV branch diffuse disease. Normal LV function and LVEDP 10 mmHg.     Neuro/Psych negative neurological ROS     GI/Hepatic Neg liver ROS, GERD  Medicated and Controlled,  Endo/Other  negative endocrine ROS  Renal/GU Renal InsufficiencyRenal disease     Musculoskeletal  (+) Arthritis ,   Abdominal   Peds  Hematology Plavix: d/c x3d   Anesthesia Other Findings   Reproductive/Obstetrics                            Anesthesia Physical Anesthesia Plan  ASA: III  Anesthesia Plan: Spinal   Post-op Pain Management:  Regional for Post-op pain   Induction:   PONV Risk Score and Plan: 1 and Ondansetron and Treatment may vary due to age or medical condition  Airway Management Planned: Simple Face Mask and Natural Airway  Additional Equipment: None  Intra-op Plan:   Post-operative Plan:    Informed Consent: I have reviewed the patients History and Physical, chart, labs and discussed the procedure including the risks, benefits and alternatives for the proposed anesthesia with the patient or authorized representative who has indicated his/her understanding and acceptance.     Dental advisory given  Plan Discussed with: CRNA and Surgeon  Anesthesia Plan Comments: (Plan routine monitors, SAB with adductor canal block for post op analgesia)       Anesthesia Quick Evaluation

## 2020-07-10 NOTE — Anesthesia Postprocedure Evaluation (Signed)
Anesthesia Post Note  Patient: Minoru Chap.  Procedure(s) Performed: TOTAL KNEE ARTHROPLASTY (Left Knee)     Patient location during evaluation: PACU Anesthesia Type: Spinal Level of consciousness: awake and alert, oriented and patient cooperative Pain management: pain level controlled Vital Signs Assessment: post-procedure vital signs reviewed and stable Respiratory status: spontaneous breathing, nonlabored ventilation and respiratory function stable Cardiovascular status: blood pressure returned to baseline and stable Postop Assessment: spinal receding, patient able to bend at knees and no apparent nausea or vomiting Anesthetic complications: no   No complications documented.  Last Vitals:  Vitals:   07/10/20 1645 07/10/20 1703  BP: 129/70 121/70  Pulse: (!) 53 (!) 55  Resp: 14 16  Temp: 36.6 C 36.6 C  SpO2: 99% 97%    Last Pain:  Vitals:   07/10/20 1703  TempSrc: Oral  PainSc: 6                  Laxmi Choung,E. Sheldon Sem

## 2020-07-10 NOTE — Progress Notes (Signed)
Orthopedic Tech Progress Note Patient Details:  [name] [name] Nov 30, 1945 [id number]  CPM Left Knee CPM Left Knee: On Left Knee Flexion (Degrees): 90 Left Knee Extension (Degrees): 0 Additional Comments: foot roll  Post Interventions Patient Tolerated: Well Instructions Provided: Care of device  [name] 07/10/2020, 4:21 PM

## 2020-07-10 NOTE — Discharge Instructions (Signed)
Ice to the knee constantly.  Keep the incision covered and clean and dry for one week, then ok to get it wet in the shower.  Do exercise as instructed every hour, please to prevent stiffness.    DO NOT prop anything under the knee, it will make your knee stiff.  Prop under the ankle to encourage your knee to go straight.   Use the walker while you are up and around for balance.  Wear your support stockings 24/7 to prevent blood clots and take plavix and aspirin daily for 30 days also to prevent blood clots  Follow up with Dr [name] in two weeks in the office, call [phone] for appt

## 2020-07-10 NOTE — Anesthesia Procedure Notes (Addendum)
Anesthesia Regional Block: Adductor canal block   Pre-Anesthetic Checklist: ,, timeout performed, Correct Patient, Correct Site, Correct Laterality, Correct Procedure, Correct Position, site marked, Risks and benefits discussed,  Surgical consent,  Pre-op evaluation,  At surgeon's request and post-op pain management  Laterality: Left and Lower  Prep: chloraprep       Needles:  Injection technique: Single-shot  Needle Type: Echogenic Needle     Needle Length: 9cm  Needle Gauge: 21     Additional Needles:   Procedures:,,,, ultrasound used (permanent image in chart),,,,  Narrative:  Start time: 07/10/2020 12:04 PM End time: 07/10/2020 12:11 PM Injection made incrementally with aspirations every 5 mL.  Performed by: Personally  Anesthesiologist: Annye Asa, MD  Additional Notes: Pt identified in Holding room.  Monitors applied. Working IV access confirmed. Sterile prep L thigh.  #21ga ECHOgenic Arrow block needle into adductor canal with US guidance.  20cc 0.75% Ropivacaine injected incrementally after negative test dose.  Patient asymptomatic, VSS, no heme aspirated, tolerated well.  Jenita Seashore, MD

## 2020-07-10 NOTE — Transfer of Care (Signed)
Immediate Anesthesia Transfer of Care Note  Patient: [place] Kuang.  Procedure(s) Performed: TOTAL KNEE ARTHROPLASTY (Left Knee)  Patient Location: PACU  Anesthesia Type:General  Level of Consciousness: awake, alert , oriented and patient cooperative  Airway & Oxygen Therapy: Patient Spontanous Breathing and Patient connected to face mask oxygen  Post-op Assessment: Report given to RN and Post -op Vital signs reviewed and stable  Post vital signs: Reviewed and stable  Last Vitals:  Vitals Value Taken Time  BP 115/68 07/10/20 1558  Temp    Pulse 56 07/10/20 1601  Resp 20 07/10/20 1601  SpO2 100 % 07/10/20 1601  Vitals shown include unvalidated device data.  Last Pain:  Vitals:   07/10/20 1129  TempSrc: Oral  PainSc: 0-No pain      Patients Stated Pain Goal: 3 (28/78/67 6720)  Complications: No complications documented.

## 2020-07-10 NOTE — Interval H&P Note (Signed)
History and Physical Interval Note:  07/10/2020 12:47 PM  [name] [name].  has presented today for surgery, with the diagnosis of left knee osteoarthrits.  The various methods of treatment have been discussed with the patient and family. After consideration of risks, benefits and other options for treatment, the patient has consented to  Procedure(s): TOTAL KNEE ARTHROPLASTY (Left) as a surgical intervention.  The patient's history has been reviewed, patient examined, no change in status, stable for surgery.  I have reviewed the patient's chart and labs.  Questions were answered to the patient's satisfaction.     [name]

## 2020-07-10 NOTE — Progress Notes (Signed)
Orthopedic Tech Progress Note Patient Details:  [name] [name] Sep 20, 1945 [id number]  CPM Left Knee CPM Left Knee: Off Left Knee Flexion (Degrees): 90 Left Knee Extension (Degrees): 0 Additional Comments: foot roll  Post Interventions Patient Tolerated: Well Instructions Provided: Care of device  [name] 07/10/2020, 7:42 PM

## 2020-07-11 DIAGNOSIS — M1712 Unilateral primary osteoarthritis, left knee: Secondary | ICD-10-CM | POA: Diagnosis not present

## 2020-07-11 DIAGNOSIS — Z8546 Personal history of malignant neoplasm of prostate: Secondary | ICD-10-CM | POA: Diagnosis not present

## 2020-07-11 DIAGNOSIS — Z87891 Personal history of nicotine dependence: Secondary | ICD-10-CM | POA: Diagnosis not present

## 2020-07-11 DIAGNOSIS — Z7982 Long term (current) use of aspirin: Secondary | ICD-10-CM | POA: Diagnosis not present

## 2020-07-11 DIAGNOSIS — I10 Essential (primary) hypertension: Secondary | ICD-10-CM | POA: Diagnosis not present

## 2020-07-11 DIAGNOSIS — Z96651 Presence of right artificial knee joint: Secondary | ICD-10-CM | POA: Diagnosis not present

## 2020-07-11 LAB — CBC
HCT: 38.1 % — ABNORMAL LOW (ref 39.0–52.0)
Hemoglobin: 12.6 g/dL — ABNORMAL LOW (ref 13.0–17.0)
MCH: 30.4 pg (ref 26.0–34.0)
MCHC: 33.1 g/dL (ref 30.0–36.0)
MCV: 91.8 fL (ref 80.0–100.0)
Platelets: 208 10*3/uL (ref 150–400)
RBC: 4.15 MIL/uL — ABNORMAL LOW (ref 4.22–5.81)
RDW: 13.2 % (ref 11.5–15.5)
WBC: 14.6 10*3/uL — ABNORMAL HIGH (ref 4.0–10.5)
nRBC: 0 % (ref 0.0–0.2)

## 2020-07-11 LAB — BASIC METABOLIC PANEL
Anion gap: 10 (ref 5–15)
BUN: 19 mg/dL (ref 8–23)
CO2: 21 mmol/L — ABNORMAL LOW (ref 22–32)
Calcium: 9.1 mg/dL (ref 8.9–10.3)
Chloride: 113 mmol/L — ABNORMAL HIGH (ref 98–111)
Creatinine, Ser: 1.55 mg/dL — ABNORMAL HIGH (ref 0.61–1.24)
GFR, Estimated: 47 mL/min — ABNORMAL LOW (ref >60)
Glucose, Bld: 166 mg/dL — ABNORMAL HIGH (ref 70–99)
Potassium: 4.1 mmol/L (ref 3.5–5.1)
Sodium: 144 mmol/L (ref 135–145)

## 2020-07-11 NOTE — Evaluation (Signed)
Physical Therapy Evaluation Patient Details Name: [name] [name]. MRN: [id number] DOB: 08/03/1945 Today's Date: 07/11/2020   History of Present Illness  Pt s/p L TKR and with hx of R TKR (21), prostate CA, glaucoma, and agent orange exposure  Clinical Impression  Pt s/p L TKR and presents with decreased L LE strength/ROM and post op pain limiting functional mobility.  Pt with c/o mild dizziness and nausea with limited ambulation this am - BP 119/69 - RN aware.  Pt should progress to dc home with family assist and reports first OP PT scheduled for "the day before I see Dr [name] [name]".    Follow Up Recommendations Follow surgeon's recommendation for DC plan and follow-up therapies    Equipment Recommendations  None recommended by PT    Recommendations for Other Services       Precautions / Restrictions Precautions Precautions: Fall;Knee Required Braces or Orthoses: Knee Immobilizer - Left Knee Immobilizer - Left: Discontinue once straight leg raise with < 10 degree lag (Pt performed IND SLR this am) Restrictions Weight Bearing Restrictions: No LLE Weight Bearing: Weight bearing as tolerated      Mobility  Bed Mobility Overal bed mobility: Needs Assistance Bed Mobility: Supine to Sit     Supine to sit: Min guard     General bed mobility comments: safety only    Transfers Overall transfer level: Needs assistance Equipment used: Rolling walker (2 wheeled) Transfers: Sit to/from Stand Sit to Stand: Min assist         General transfer comment: cues for LE management and use of UEs to self assist  Ambulation/Gait Ambulation/Gait assistance: Min assist Gait Distance (Feet): 19 Feet Assistive device: Rolling walker (2 wheeled) Gait Pattern/deviations: Step-to pattern;Decreased step length - right;Decreased step length - left;Shuffle;Trunk flexed Gait velocity: decr   General Gait Details: cues for sequence, posture and position from RW.  Distance ltd by c/o nausea and  dizziness  Stairs            Wheelchair Mobility    Modified Rankin (Stroke Patients Only)       Balance Overall balance assessment: Needs assistance Sitting-balance support: No upper extremity supported;Feet supported Sitting balance-Leahy Scale: Good     Standing balance support: Bilateral upper extremity supported Standing balance-Leahy Scale: Poor                               Pertinent Vitals/Pain Pain Assessment: 0-10 Pain Score: 5  Pain Location: L knee Pain Descriptors / Indicators: Aching;Sore Pain Intervention(s): Limited activity within patient's tolerance;Monitored during session;Premedicated before session;Ice applied    Home Living Family/patient expects to be discharged to:: Private residence Living Arrangements: Spouse/significant other Available Help at Discharge: Family;Available 24 hours/day Type of Home: House Home Access: Stairs to enter Entrance Stairs-Rails: Right Entrance Stairs-Number of Steps: 5 Home Layout: One level Home Equipment: Walker - 2 wheels;Cane - single point      Prior Function Level of Independence: Independent               Hand Dominance        Extremity/Trunk Assessment   Upper Extremity Assessment Upper Extremity Assessment: Overall WFL for tasks assessed    Lower Extremity Assessment Lower Extremity Assessment: LLE deficits/detail LLE Deficits / Details: 3/5 quads with IND SLR; AAROM at knee -5 - 75    Cervical / Trunk Assessment Cervical / Trunk Assessment: Normal  Communication   Communication: No difficulties  Cognition Arousal/Alertness: Awake/alert Behavior During Therapy: WFL for tasks assessed/performed Overall Cognitive Status: Within Functional Limits for tasks assessed                                        General Comments      Exercises Total Joint Exercises Ankle Circles/Pumps: AROM;Both;15 reps;Supine Quad Sets: AROM;Both;10 reps;Supine Heel  Slides: AAROM;Left;15 reps;Supine Straight Leg Raises: AAROM;AROM;Left;Supine;15 reps   Assessment/Plan    PT Assessment Patient needs continued PT services  PT Problem List Decreased strength;Decreased range of motion;Decreased activity tolerance;Decreased balance;Decreased mobility;Decreased knowledge of use of DME;Pain       PT Treatment Interventions DME instruction;Gait training;Stair training;Functional mobility training;Therapeutic activities;Therapeutic exercise;Patient/family education    PT Goals (Current goals can be found in the Care Plan section)  Acute Rehab PT Goals Patient Stated Goal: Regain IND PT Goal Formulation: With patient Time For Goal Achievement: 07/18/20 Potential to Achieve Goals: Good    Frequency 7X/week   Barriers to discharge        Co-evaluation               AM-PAC PT "6 Clicks" Mobility  Outcome Measure Help needed turning from your back to your side while in a flat bed without using bedrails?: A Little Help needed moving from lying on your back to sitting on the side of a flat bed without using bedrails?: A Little Help needed moving to and from a bed to a chair (including a wheelchair)?: A Little Help needed standing up from a chair using your arms (e.g., wheelchair or bedside chair)?: A Little Help needed to walk in hospital room?: A Little Help needed climbing 3-5 steps with a railing? : A Lot 6 Click Score: 17    End of Session Equipment Utilized During Treatment: Gait belt Activity Tolerance: Patient tolerated treatment well;Patient limited by fatigue Patient left: in chair;with call bell/phone within reach Nurse Communication: Mobility status PT Visit Diagnosis: Difficulty in walking, not elsewhere classified (R26.2)    Time: 3716-9678 PT Time Calculation (min) (ACUTE ONLY): 29 min   Charges:   PT Evaluation $PT Eval Low Complexity: 1 Low PT Treatments $Therapeutic Exercise: 8-22 mins        [name] [name]  PT Acute Rehabilitation Services Pager [phone] Office [phone]   [name] [name] 07/11/2020, 8:39 AM

## 2020-07-11 NOTE — Op Note (Signed)
NAME: KIJUAN, GALLICCHIO MEDICAL RECORD NO: [id number] ACCOUNT NO: [id number] DATE OF BIRTH: 20-Jul-1945 FACILITY: [facility] PHYSICIAN: Doran Heater. [name], MD  Operative Report   DATE OF PROCEDURE: 07/10/2020  PREOPERATIVE DIAGNOSIS:  Left knee end-stage arthritis.  POSTOPERATIVE DIAGNOSIS:  Left knee end-stage arthritis.  PROCEDURE PERFORMED:  Left total knee replacement using DePuy Attune prosthesis.  ATTENDING SURGEON:  Doran Heater. [name], MD.  ASSISTANT:  Charletta Cousin [place], [place], who was scrubbed during the entire procedure, and necessary for satisfactory completion of the surgery.  Spinal anesthesia plus adductor canal block was used.  ESTIMATED BLOOD LOSS:  Minimal.  FLUID REPLACEMENT:  1500 mL crystalloid.    Instrument counts were correct.  There were no complications.  Perioperative antibiotics were given.  INDICATIONS:  The patient is a 75 year old male with a history of worsening left knee pain secondary to end-stage arthritis.  The patient has failed an extended period of conservative management and presents for operative treatment to restore function  and eliminate pain.  Informed consent obtained.  DESCRIPTION OF PROCEDURE:  After an adequate level of anesthesia was achieved, the patient was positioned supine on the operating table.  Left leg correctly identified a nonsterile tourniquet placed on the proximal thigh.  Left leg sterilely prepped and  draped in the usual manner.  Timeout called, verifying correct patient, correct site.  We elevated the limb and exsanguinated with an Esmarch bandage and then elevated the tourniquet to 300 mmHg, we placed the knee in flexion, performed a longitudinal  midline incision with a 10 blade scalpel.  Dissection down through subcutaneous tissues, we performed medial parapatellar arthrotomy with a fresh 10 blade scalpel.  We then divided the lateral patellofemoral ligament and then everted the patella and  exposed the distal femur.   There was no cartilage on the medial femoral condyle, both on the femur and also on the tibia.  We entered the distal femur with the step cut drill.  We then placed an intramedullary resection guide and resected 9 mm off, 5  degrees left.  We then sized our femur to a size 6 anterior down performing anterior, posterior and chamfer cuts with a 4-in-1 block, removed ACL and PCL meniscal tissue subluxing the tibia anteriorly.  We then performed our tibial cut 90 degrees  perpendicular to the long axis of the tibia, resecting 2 mm off the medial side.  We went ahead and drilled some hard bone on that medial side and then placed our lamina spreader, removed excess osteophytes off the posterior femoral condyles.  We then  injected the posterior aspect of the knee with a combination of Marcaine, saline and Exparel.  Next, we checked our gaps, which were symmetric at 8 mm.  We then removed our pins from our tibia, completed our tibial preparation with a modular drill and  keel punch for the size 7 tibia, which fit perfectly and we then completed our femoral preparation with the notch cut or box cut guide and then used the oscillating saw to remove the box for the posterior cruciate substituting femoral prosthesis size 6  left.  We also drilled lug holes.  We removed all the trial components.  We placed an 8 mm size 6 poly trial on the tibia and then reduced the knee.  We were happy with our soft tissue balancing.  We then resurfaced the patella going from 26 mm down to  16 mm and then did drill our lug holes for the 38 patellar button.  We ranged the knee and had excellent tracking with no-touch technique.  We removed all trial components, irrigated thoroughly, dried the bone and then vacuum mixed high viscosity cement  on the back table.  We cemented the components into place tibia, femur and patella in a single stage, placing the knee in full extension with an 8 spacer.  Once the cement was hardened, we removed  excess cement with quarter inch curved osteotome and  tonsil.  Once we had all that cleaned up, we went ahead and selected the real size 6, 10 mm polyethylene and placed on the tibia, reducing the knee.  We had excellent flexion and extension stability.  We were able to get to full extension.  We ranged the  knee.  We had great patellar tracking.  We irrigated thoroughly and then repaired the parapatellar arthrotomy with #1 Vicryl suture followed by 0 and 2-0 layered subcutaneous closure and 4-0 Monocryl for skin.  Steri-Strips were applied followed by  sterile dressing.  The patient tolerated surgery well.   SUJ D: 07/10/2020 3:42:51 pm T: 07/11/2020 5:31:00 am  JOB: [id number]/ [id number]

## 2020-07-11 NOTE — Progress Notes (Signed)
Physical Therapy Treatment Patient Details Name: [name] [name]. MRN: [id number] DOB: [birth year] Today's Date: 07/11/2020    History of Present Illness Pt s/p L TKR and with hx of R TKR (21), prostate CA, glaucoma, and agent orange exposure    PT Comments    Pt progressing well with mobility and up to ambulate increased distance in hall, negotiated stairs and performed HEP with written instruction provided and reviewed.  Family present for family ed.   Follow Up Recommendations  Follow surgeon's recommendation for DC plan and follow-up therapies     Equipment Recommendations  None recommended by PT    Recommendations for Other Services       Precautions / Restrictions Precautions Precautions: Fall;Knee Restrictions Weight Bearing Restrictions: No LLE Weight Bearing: Weight bearing as tolerated    Mobility  Bed Mobility               General bed mobility comments: Up in chair and requests back to same    Transfers Overall transfer level: Needs assistance Equipment used: Rolling walker (2 wheeled) Transfers: Sit to/from Stand Sit to Stand: Min guard;Supervision         General transfer comment: cues for LE management and use of UEs to self assist  Ambulation/Gait Ambulation/Gait assistance: Min guard;Supervision Gait Distance (Feet): 100 Feet Assistive device: Rolling walker (2 wheeled) Gait Pattern/deviations: Step-to pattern;Decreased step length - right;Decreased step length - left;Shuffle;Trunk flexed Gait velocity: decr   General Gait Details: cues for sequence, posture and position from RW.  No c/o nausea and dizziness   Stairs Stairs: Yes Stairs assistance: Min assist Stair Management: One rail Right;Step to pattern;Forwards;With cane Number of Stairs: 3 General stair comments: cues for sequence and foot/cane placement   Wheelchair Mobility    Modified Rankin (Stroke Patients Only)       Balance Overall balance assessment: Needs  assistance Sitting-balance support: No upper extremity supported;Feet supported Sitting balance-Leahy Scale: Good     Standing balance support: No upper extremity supported Standing balance-Leahy Scale: Fair                              Cognition Arousal/Alertness: Awake/alert Behavior During Therapy: WFL for tasks assessed/performed Overall Cognitive Status: Within Functional Limits for tasks assessed                                        Exercises Total Joint Exercises Ankle Circles/Pumps: AROM;Both;15 reps;Supine Quad Sets: AROM;Both;10 reps;Supine Heel Slides: AAROM;Left;15 reps;Supine Straight Leg Raises: AAROM;AROM;Left;Supine;15 reps Long Arc Quad: AAROM;Left;10 reps;Seated Knee Flexion: AAROM;Left;5 reps;Seated    General Comments        Pertinent Vitals/Pain Pain Assessment: 0-10 Pain Score: 5  Pain Location: L knee Pain Descriptors / Indicators: Aching;Sore Pain Intervention(s): Limited activity within patient's tolerance;Monitored during session;Premedicated before session;Ice applied    Home Living                      Prior Function            PT Goals (current goals can now be found in the care plan section) Acute Rehab PT Goals Patient Stated Goal: Regain IND PT Goal Formulation: With patient Time For Goal Achievement: 07/18/20 Potential to Achieve Goals: Good Progress towards PT goals: Progressing toward goals    Frequency    7X/week  PT Plan Current plan remains appropriate    Co-evaluation              AM-PAC PT "6 Clicks" Mobility   Outcome Measure  Help needed turning from your back to your side while in a flat bed without using bedrails?: A Little Help needed moving from lying on your back to sitting on the side of a flat bed without using bedrails?: A Little Help needed moving to and from a bed to a chair (including a wheelchair)?: A Little Help needed standing up from a chair  using your arms (e.g., wheelchair or bedside chair)?: A Little Help needed to walk in hospital room?: A Little Help needed climbing 3-5 steps with a railing? : A Little 6 Click Score: 18    End of Session Equipment Utilized During Treatment: Gait belt Activity Tolerance: Patient tolerated treatment well Patient left: in chair;with call bell/phone within reach;with family/visitor present Nurse Communication: Mobility status PT Visit Diagnosis: Difficulty in walking, not elsewhere classified (R26.2)     Time: 1150-1240 PT Time Calculation (min) (ACUTE ONLY): 50 min  Charges:  $Gait Training: 8-22 mins $Therapeutic Exercise: 8-22 mins $Therapeutic Activity: 8-22 mins                     Debe Coder PT Acute Rehabilitation Services Pager [phone] Office [phone]    Tondra Reierson 07/11/2020, 12:54 PM

## 2020-07-11 NOTE — Progress Notes (Signed)
Subjective: 1 Day Post-Op Procedure(s) (LRB): TOTAL KNEE ARTHROPLASTY (Left) Patient reports pain as mild.   Patient seen in rounds for [name] Doing well, no events overnight  Objective: Vital signs in last 24 hours: Temp:  [97.5 F (36.4 C)-98.2 F (36.8 C)] 98.2 F (36.8 C) (05/07 0918) Pulse Rate:  [51-75] 75 (05/07 0918) Resp:  [13-19] 18 (05/07 0918) BP: (111-153)/(65-80) 134/72 (05/07 0918) SpO2:  [97 %-100 %] 98 % (05/07 0918) Weight:  [89.9 kg] 89.9 kg (05/06 1129)  Intake/Output from previous day: 05/06 0701 - 05/07 0700 In: 1842.1 [I.V.:1442.1; IV Piggyback:400] Out: 875 [Urine:850; Blood:25] Intake/Output this shift: Total I/O In: 360 [P.O.:360] Out: 200 [Urine:200]  Recent Labs    07/09/20 1348 07/11/20 0302  HGB 14.4 12.6*   Recent Labs    07/09/20 1348 07/11/20 0302  WBC 9.0 14.6*  RBC 4.81 4.15*  HCT 43.5 38.1*  PLT 251 208   Recent Labs    07/09/20 1348 07/11/20 0302  NA 140 144  K 3.9 4.1  CL 111 113*  CO2 19* 21*  BUN 24* 19  CREATININE 1.76* 1.55*  GLUCOSE 96 166*  CALCIUM 9.8 9.1   No results for input(s): LABPT, INR in the last 72 hours.  Neurologically intact Neurovascular intact Sensation intact distally Intact pulses distally Dorsiflexion/Plantar flexion intact Incision: dressing C/D/I Compartment soft   Assessment/Plan: 1 Day Post-Op Procedure(s) (LRB): TOTAL KNEE ARTHROPLASTY (Left) Advance diet Up with therapy Plan for discharge home today if passes PT New dressings applied Will follow up with [name] in office Leave dressings on until follow up    Patient's anticipated LOS is less than 2 midnights, meeting these requirements: - Younger than 52 - Lives within 1 hour of care - Has a competent adult at home to recover with post-op recover - NO history of  - Chronic pain requiring opiods  - Diabetes  - Coronary Artery Disease  - Heart failure  - Heart attack  - Stroke  - DVT/VTE  - Cardiac  arrhythmia  - Respiratory Failure/COPD  - Renal failure  - Anemia  - Advanced Liver disease       Nettie Elm [facility]  [phone] 07/11/2020, 11:08 AM

## 2020-07-13 ENCOUNTER — Ambulatory Visit: Payer: Medicare Other | Attending: Orthopedic Surgery | Admitting: Physical Therapy

## 2020-07-13 ENCOUNTER — Encounter: Payer: Self-pay | Admitting: Physical Therapy

## 2020-07-13 ENCOUNTER — Other Ambulatory Visit: Payer: Self-pay

## 2020-07-13 DIAGNOSIS — M25662 Stiffness of left knee, not elsewhere classified: Secondary | ICD-10-CM | POA: Diagnosis not present

## 2020-07-13 DIAGNOSIS — M6281 Muscle weakness (generalized): Secondary | ICD-10-CM | POA: Insufficient documentation

## 2020-07-13 DIAGNOSIS — M25562 Pain in left knee: Secondary | ICD-10-CM | POA: Diagnosis not present

## 2020-07-13 DIAGNOSIS — R600 Localized edema: Secondary | ICD-10-CM | POA: Insufficient documentation

## 2020-07-13 DIAGNOSIS — G8929 Other chronic pain: Secondary | ICD-10-CM | POA: Insufficient documentation

## 2020-07-13 NOTE — Therapy (Signed)
[facility] [place], [place], [address] Phone: [phone]   Fax:  [phone]  Physical Therapy Evaluation  Patient Details  Name: [name] [name]. MRN: [id number] Date of Birth: [birth year] Referring Provider (PT): Esmond Plants MD   Encounter Date: 07/13/2020   PT End of Session - 07/13/20 1146    Visit Number 1    Number of Visits 12    Date for PT Re-Evaluation 08/10/20    Authorization Type FOTO AT LEAST EVERY 5TH VISIT.  PROGRESS NOTE AT 10TH VISIT.  KX MODIFIER AFTER 15 VISITS.    PT Start Time 1030    PT Stop Time 1111    PT Time Calculation (min) 41 min           Past Medical History:  Diagnosis Date  . ABNORMAL EKG 10/07/2008  . ADVEF, DRUG/MED/BIOL SUBST, OTHER DRUG NOS 09/26/2006  . Agent orange exposure   . Allergy    seasonal  . Cataract    left eye-surgery 07-2015  . DEGENERATIVE JOINT DISEASE, MODERATE 04/13/2007  . DIVERTICULOSIS, COLON 04/13/2007  . ELEVATED PROSTATE SPECIFIC ANTIGEN 04/14/2007  . GERD (gastroesophageal reflux disease)   . Glaucoma    sees [name]   . History of kidney stones    1970   . HYPERLIPIDEMIA 09/26/2006  . HYPERTENSION 04/13/2007  . JOINT EFFUSION, KNEE 06/15/2009  . Melanoma [facility])    sees [name]   . Numbness and tingling   . Pre-diabetes   . PROSTATE CANCER 10/05/2009   sees [name]  . Renal insufficiency    stage III  Kidney     Past Surgical History:  Procedure Laterality Date  . ACROMIOPLASTY Right 05-06-14   per [name]   . BUNIONECTOMY     bilateral  . CATARACT EXTRACTION Left    per [name]   . COLONOSCOPY  07/10/2015   per [name], benign polyp, repeat in 10 yrs   . CORONARY STENT INTERVENTION N/A 01/17/2018   Procedure: CORONARY STENT INTERVENTION;  Surgeon: [name], MD;  Location: [facility];  Service: Cardiovascular;  Laterality: N/A;  . CYSTECTOMY     benign from hand  . GLAUCOMA SURGERY Bilateral 2014   .  heart stent    . INSERTION OF MESH N/A 01/10/2013   Procedure: INSERTION OF MESH;  Surgeon: Earnstine Regal, MD;  Location: [facility];  Service: General;  Laterality: N/A;  . KNEE ARTHROSCOPY Right Jan. 2013   right knee, per [name]   . KNEE ARTHROSCOPY Left 01-14-15   per [name]   . LEFT HEART CATH AND CORONARY ANGIOGRAPHY N/A 01/17/2018   Procedure: LEFT HEART CATH AND CORONARY ANGIOGRAPHY;  Surgeon: [name], MD;  Location: [facility];  Service: Cardiovascular;  Laterality: N/A;  . LEFT HEART CATH AND CORONARY ANGIOGRAPHY N/A 04/06/2018   Procedure: LEFT HEART CATH AND CORONARY ANGIOGRAPHY;  Surgeon: [name], MD;  Location: [facility];  Service: Cardiovascular;  Laterality: N/A;  . MELANOMA EXCISION  2012   per [name]  . PROSTATECTOMY     per [name]  . TONSILLECTOMY    . TOTAL KNEE ARTHROPLASTY Right 07/22/2019   Procedure: TOTAL KNEE ARTHROPLASTY;  Surgeon: Netta Cedars, MD;  [facility];  Service: Orthopedics;  Laterality: Right;  . UMBILICAL HERNIA REPAIR  11/01/2011   Procedure: HERNIA REPAIR UMBILICAL ADULT;  Surgeon: Earnstine Regal, MD;  Location: [facility];  Service: General;  Laterality: N/A;  Repair umbilical hernia with mesh patch  . VASECTOMY    . VENTRAL HERNIA REPAIR  01/10/2013   with mesh     [name]  . VENTRAL HERNIA REPAIR N/A 01/10/2013   Procedure: HERNIA REPAIR VENTRAL ADULT ;  Surgeon: Earnstine Regal, MD;  Location: [facility];  Service: General;  Laterality: N/A;    There were no vitals filed for this visit.    Subjective Assessment - 07/13/20 1141    Subjective COVID-19 screen performed prior to patient entering clinic.  The patient presents to the clinic today s/p left total knee replacement performed on 07/10/20.  He is pleased with his progress thus far and is compliant to his HEP.  He is walking safely with a FWW and is also complinat to TED hose usage.  His resting pain-level is a 4/10 today and higher  with exercise.  Pain medication and ice decrease pain.    Pertinent History Right knee surgery, DJD, HTN, right shoulder.    Patient Stated Goals Get back to normal life.    Pain Score 4     Pain Location Knee    Pain Descriptors / Indicators Aching;Throbbing    Pain Type Surgical pain    Pain Frequency Constant              [facility] PT Assessment - 07/13/20 0001      Assessment   Medical Diagnosis Left total knee replacement.    Referring Provider (PT) Esmond Plants MD    Onset Date/Surgical Date 07/10/20      Precautions   Precaution Comments No ultrasound.      Restrictions   Weight Bearing Restrictions No      Balance Screen   Has the patient fallen in the past 6 months Yes    How many times? 1.    Has the patient had a decrease in activity level because of a fear of falling?  No    Is the patient reluctant to leave their home because of a fear of falling?  No      Home Environment   Living Environment Private residence      Prior Function   Level of Independence Independent      Observation/Other Assessments   Observations Aquacel intact over left knee.      Observation/Other Assessments-Edema    Edema Circumferential      Circumferential Edema   Circumferential - Right LT 5 cms > RT.      ROM / Strength   AROM / PROM / Strength AROM;Strength      AROM   Overall AROM Comments In supine left knee is -10 degrees but this is within 5 degrees of his right knee.  Active left knee flexion is 90 degrees and passive is 95 degrees.      Strength   Overall Strength Comments Left hip flexion is 3-/5, left knee extension is 3-/5.      Palpation   Palpation comment Diffuse left knee tenderness.      Ambulation/Gait   Gait Comments Safeambulation with a FWW.                      Objective measurements completed on examination: See above findings.       [facility] Adult PT Treatment/Exercise - 07/13/20 0001      Modalities   Modalities Vasopneumatic       Vasopneumatic  Number Minutes Vasopneumatic  15 minutes    Vasopnuematic Location  --   Left knee.   Vasopneumatic Pressure Low                       PT Long Term Goals - 07/13/20 1156      PT LONG TERM GOAL #1   Title Independent with a HEP.    Time 4    Period Weeks    Status New      PT LONG TERM GOAL #2   Title Pt will be able to amb with no device with step through gait pattern on community level surfaces.    Time 4    Period Weeks    Status New      PT LONG TERM GOAL #3   Title Pt will improve his left knee flexion to 125 degrees in order to be able to improve functional mobility and gait.    Time 4    Period Weeks      PT LONG TERM GOAL #4   Title Pt will be able to reach active left knee extension to -5 degrees.    Time 4    Period Weeks    Status New      PT LONG TERM GOAL #5   Title Pt will be able to climb a flight of stairs with one rail with no pain reported.    Time 4    Period Weeks    Status New      PT LONG TERM GOAL #6   Title Perform ADL's with pain not > 3/10    Time 4    Period Weeks    Status New                  Plan - 07/13/20 1146    Clinical Impression Statement The patient presents to OPPT s/p left total knee replacment performl knee replacement performed on 07/10/20.  He is doing quite well thus far.  He is lacking some flexion and extension as well as strength as expected.  He currently has a moderate amount of left knee edema.  He is currently on a FWW for safe ambulation.  Aquacel is intact over his left knee.  Patient will benefit from skilled physical therapy intervention to address pain and deficits.    Personal Factors and Comorbidities Comorbidity 1;Comorbidity 2;Other    Comorbidities Right knee surgery, DJD, HTN, right shoulder.    Examination-Activity Limitations Other;Locomotion Level    Examination-Participation Restrictions Other    Stability/Clinical Decision Making Stable/Uncomplicated     Clinical Decision Making Low    Rehab Potential Excellent    PT Frequency 3x / week    PT Duration 4 weeks    PT Treatment/Interventions ADLs/Self Care Home Management;Cryotherapy;Electrical Stimulation;Moist Heat;Gait training;Stair training;Functional mobility training;Therapeutic activities;Therapeutic exercise;Neuromuscular re-education;Manual techniques;Patient/family education;Passive range of motion;Vasopneumatic Device    PT Next Visit Plan FOTO...Marland KitchenMarland KitchenProgress through TKA protocol.  VMS to quadriceps, e'stim/Vaso.    Consulted and Agree with Plan of Care Patient           Patient will benefit from skilled therapeutic intervention in order to improve the following deficits and impairments:  Abnormal gait,Pain,Decreased activity tolerance,Decreased strength,Increased edema,Decreased range of motion  Visit Diagnosis: Chronic pain of left knee - Plan: PT plan of care cert/re-cert  Stiffness of left knee, not elsewhere classified - Plan: PT plan of care cert/re-cert  Localized edema - Plan: PT plan of care cert/re-cert  Muscle weakness (generalized) - Plan: PT plan of care cert/re-cert     Problem List Patient Active Problem List   Diagnosis Date Noted  . H/O total knee replacement, left 07/10/2020  . Hyperglycemia 05/13/2020  . Status post total knee replacement, right 07/22/2019  . Chronic pain of right knee 05/29/2019  . Numbness and tingling 05/29/2019  . CKD (chronic kidney disease) stage 3, GFR 30-59 ml/min (HCC) 05/20/2019  . Paresthesia 01/17/2019  . Upper airway cough syndrome 09/19/2018  . DOE (dyspnea on exertion) 08/08/2018  . Abnormal cardiac CT angiography 01/17/2018  . Angina, class II ([place]) 01/17/2018  . Incisional hernia 08/11/2011  . PROSTATE CANCER 10/05/2009  . Coronary atherosclerosis 10/05/2009  . ABNORMAL EKG 10/07/2008  . Essential hypertension 04/13/2007  . DIVERTICULOSIS, COLON 04/13/2007  . Osteoarthritis 04/13/2007  . Hyperlipidemia with  target LDL less than 70 09/26/2006    Samanthia Howland, [place]  MPT 07/13/2020, 12:02 PM  [facility] [address] [place], [place], [address] Phone: [phone]   Fax:  [phone]  Name: [name] [name]. MRN: [id number] Date of Birth: 1945/04/17

## 2020-07-14 ENCOUNTER — Encounter (HOSPITAL_COMMUNITY): Payer: Self-pay | Admitting: Orthopedic Surgery

## 2020-07-14 DIAGNOSIS — Z4789 Encounter for other orthopedic aftercare: Secondary | ICD-10-CM | POA: Diagnosis not present

## 2020-07-16 ENCOUNTER — Ambulatory Visit: Payer: Medicare Other | Admitting: Physical Therapy

## 2020-07-16 ENCOUNTER — Other Ambulatory Visit: Payer: Self-pay

## 2020-07-16 DIAGNOSIS — G8929 Other chronic pain: Secondary | ICD-10-CM

## 2020-07-16 DIAGNOSIS — R600 Localized edema: Secondary | ICD-10-CM | POA: Diagnosis not present

## 2020-07-16 DIAGNOSIS — M25662 Stiffness of left knee, not elsewhere classified: Secondary | ICD-10-CM

## 2020-07-16 DIAGNOSIS — M25562 Pain in left knee: Secondary | ICD-10-CM | POA: Diagnosis not present

## 2020-07-16 DIAGNOSIS — M6281 Muscle weakness (generalized): Secondary | ICD-10-CM | POA: Diagnosis not present

## 2020-07-16 NOTE — Therapy (Signed)
[facility], [place], [address] Phone: [phone]   Fax:  [phone]  Physical Therapy Treatment  Patient Details  Name: [name] [name]. MRN: [id number] Date of Birth: [birth year] Referring Provider (PT): Esmond Plants MD   Encounter Date: 07/16/2020   PT End of Session - 07/16/20 1001    Visit Number 2    Number of Visits 12    Date for PT Re-Evaluation 08/10/20    Authorization Type FOTO AT LEAST EVERY 5TH VISIT.  PROGRESS NOTE AT 10TH VISIT.  KX MODIFIER AFTER 15 VISITS.    PT Start Time [phone]    PT Stop Time 1035    PT Time Calculation (min) 58 min    Activity Tolerance Patient tolerated treatment well    Behavior During Therapy WFL for tasks assessed/performed           Past Medical History:  Diagnosis Date  . ABNORMAL EKG 10/07/2008  . ADVEF, DRUG/MED/BIOL SUBST, OTHER DRUG NOS 09/26/2006  . Agent orange exposure   . Allergy    seasonal  . Cataract    left eye-surgery 07-2015  . DEGENERATIVE JOINT DISEASE, MODERATE 04/13/2007  . DIVERTICULOSIS, COLON 04/13/2007  . ELEVATED PROSTATE SPECIFIC ANTIGEN 04/14/2007  . GERD (gastroesophageal reflux disease)   . Glaucoma    sees [name]   . History of kidney stones    1970   . HYPERLIPIDEMIA 09/26/2006  . HYPERTENSION 04/13/2007  . JOINT EFFUSION, KNEE 06/15/2009  . Melanoma [facility])    sees [name]   . Numbness and tingling   . Pre-diabetes   . PROSTATE CANCER 10/05/2009   sees [name]  . Renal insufficiency    stage III [facility]     Past Surgical History:  Procedure Laterality Date  . ACROMIOPLASTY Right 05-06-14   per [name]   . BUNIONECTOMY     bilateral  . CATARACT EXTRACTION Left    per [name]   . COLONOSCOPY  07/10/2015   per [name], benign polyp, repeat in 10 yrs   . CORONARY STENT INTERVENTION N/A 01/17/2018   Procedure: CORONARY STENT INTERVENTION;  Surgeon: [name], MD;  Location: [place] [facility];   Service: Cardiovascular;  Laterality: N/A;  . CYSTECTOMY     benign from hand  . GLAUCOMA SURGERY Bilateral 2014   . heart stent    . INSERTION OF MESH N/A 01/10/2013   Procedure: INSERTION OF MESH;  Surgeon: Earnstine Regal, MD;  Location: [place];  Service: General;  Laterality: N/A;  . KNEE ARTHROSCOPY Right Jan. 2013   right knee, per [name]   . KNEE ARTHROSCOPY Left 01-14-15   per [name]   . LEFT HEART CATH AND CORONARY ANGIOGRAPHY N/A 01/17/2018   Procedure: LEFT HEART CATH AND CORONARY ANGIOGRAPHY;  Surgeon: [name], MD;  Location: [place] [facility];  Service: Cardiovascular;  Laterality: N/A;  . LEFT HEART CATH AND CORONARY ANGIOGRAPHY N/A 04/06/2018   Procedure: LEFT HEART CATH AND CORONARY ANGIOGRAPHY;  Surgeon: [name], MD;  Location: [place] [facility];  Service: Cardiovascular;  Laterality: N/A;  . MELANOMA EXCISION  2012   per [name]  . PROSTATECTOMY     per [name]  . TONSILLECTOMY    . TOTAL KNEE ARTHROPLASTY Right 07/22/2019   Procedure: TOTAL KNEE ARTHROPLASTY;  Surgeon: Netta Cedars, MD;  [facility];  Service: Orthopedics;  Laterality: Right;  .  TOTAL KNEE ARTHROPLASTY Left 07/10/2020   Procedure: TOTAL KNEE ARTHROPLASTY;  Surgeon: Netta Cedars, MD;  [facility];  Service: Orthopedics;  Laterality: Left;  . UMBILICAL HERNIA REPAIR  11/01/2011   Procedure: HERNIA REPAIR UMBILICAL ADULT;  Surgeon: Earnstine Regal, MD;  Location: [place];  Service: General;  Laterality: N/A;  Repair umbilical hernia with mesh patch  . VASECTOMY    . VENTRAL HERNIA REPAIR  01/10/2013   with mesh     [name]  . VENTRAL HERNIA REPAIR N/A 01/10/2013   Procedure: HERNIA REPAIR VENTRAL ADULT ;  Surgeon: Earnstine Regal, MD;  Location: [place];  Service: General;  Laterality: N/A;    There were no vitals filed for this visit.   Subjective Assessment - 07/16/20 0925    Subjective COVID-19 screen performed prior to patient entering clinic.   Doctor took off Aquacel.  Sterile gauze and ACE wrap donned.    Pertinent History Right knee surgery, DJD, HTN, right shoulder.    Patient Stated Goals Get back to normal life.    Currently in Pain? Yes    Pain Score 4     Pain Orientation Left    Pain Type Surgical pain                             [facility] Adult PT Treatment/Exercise - 07/16/20 0001      Exercises   Exercises Knee/Hip      Knee/Hip Exercises: Aerobic   Nustep Level 3 x 15 minutes moving seat forward x 2 to increase knee flexion.      Knee/Hip Exercises: Supine   Short Arc Quad Sets Limitations 16 minutes facilitated with Bi-Phasic electrical stimulation wiht 10 sec extension holds f/b a 10 sec rest.      Vasopneumatic   Number Minutes Vasopneumatic  15 minutes    Vasopnuematic Location  --   Left knee.   Vasopneumatic Pressure Low      Manual Therapy   Manual Therapy Passive ROM    Passive ROM In supine:  PROM x 3 minutes to patient's left knee with LLLDS utilized.                       PT Long Term Goals - 07/13/20 1156      PT LONG TERM GOAL #1   Title Independent with a HEP.    Time 4    Period Weeks    Status New      PT LONG TERM GOAL #2   Title Pt will be able to amb with no device with step through gait pattern on community level surfaces.    Time 4    Period Weeks    Status New      PT LONG TERM GOAL #3   Title Pt will improve his left knee flexion to 125 degrees in order to be able to improve functional mobility and gait.    Time 4    Period Weeks      PT LONG TERM GOAL #4   Title Pt will be able to reach active left knee extension to -5 degrees.    Time 4    Period Weeks    Status New      PT LONG TERM GOAL #5   Title Pt will be able to climb a flight of stairs with one rail with no pain reported.  Time 4    Period Weeks    Status New      PT LONG TERM GOAL #6   Title Perform ADL's with pain not > 3/10    Time 4    Period Weeks    Status New                   Patient will benefit from skilled therapeutic intervention in order to improve the following deficits and impairments:     Visit Diagnosis: Chronic pain of left knee  Stiffness of left knee, not elsewhere classified  Localized edema     Problem List Patient Active Problem List   Diagnosis Date Noted  . H/O total knee replacement, left 07/10/2020  . Hyperglycemia 05/13/2020  . Status post total knee replacement, right 07/22/2019  . Chronic pain of right knee 05/29/2019  . Numbness and tingling 05/29/2019  . CKD (chronic kidney disease) stage 3, GFR 30-59 ml/min (HCC) 05/20/2019  . Paresthesia 01/17/2019  . Upper airway cough syndrome 09/19/2018  . DOE (dyspnea on exertion) 08/08/2018  . Abnormal cardiac CT angiography 01/17/2018  . Angina, class II ([place]) 01/17/2018  . Incisional hernia 08/11/2011  . PROSTATE CANCER 10/05/2009  . Coronary atherosclerosis 10/05/2009  . ABNORMAL EKG 10/07/2008  . Essential hypertension 04/13/2007  . DIVERTICULOSIS, COLON 04/13/2007  . Osteoarthritis 04/13/2007  . Hyperlipidemia with target LDL less than 70 09/26/2006    Mansa Willers, [place] MPT 07/16/2020, 10:14 AM  [facility] [address] [place], [place], [address] Phone: [phone]   Fax:  [phone]  Name: [name] [name]. MRN: [id number] Date of Birth: 23-Jul-1945

## 2020-07-20 ENCOUNTER — Ambulatory Visit: Payer: Medicare Other | Admitting: Physical Therapy

## 2020-07-20 ENCOUNTER — Encounter: Payer: Self-pay | Admitting: Physical Therapy

## 2020-07-20 ENCOUNTER — Other Ambulatory Visit: Payer: Self-pay

## 2020-07-20 DIAGNOSIS — R600 Localized edema: Secondary | ICD-10-CM

## 2020-07-20 DIAGNOSIS — M6281 Muscle weakness (generalized): Secondary | ICD-10-CM

## 2020-07-20 DIAGNOSIS — M25662 Stiffness of left knee, not elsewhere classified: Secondary | ICD-10-CM | POA: Diagnosis not present

## 2020-07-20 DIAGNOSIS — M25562 Pain in left knee: Secondary | ICD-10-CM | POA: Diagnosis not present

## 2020-07-20 DIAGNOSIS — G8929 Other chronic pain: Secondary | ICD-10-CM

## 2020-07-20 NOTE — Therapy (Signed)
[facility], [place], [address] Phone: [phone]   Fax:  [phone]  Physical Therapy Treatment  Patient Details  Name: [name] [name]. MRN: [id number] Date of Birth: May 11, 1945 Referring Provider (PT): Esmond Plants MD   Encounter Date: 07/20/2020   PT End of Session - 07/20/20 1031    Visit Number 3    Number of Visits 12    Date for PT Re-Evaluation 08/10/20    Authorization Type FOTO AT LEAST EVERY 5TH VISIT.  PROGRESS NOTE AT 10TH VISIT.  KX MODIFIER AFTER 15 VISITS.    PT Start Time [phone]    PT Stop Time 1036    PT Time Calculation (min) 49 min    Equipment Utilized During Treatment Other (comment)   FWW   Activity Tolerance Patient tolerated treatment well    Behavior During Therapy WFL for tasks assessed/performed           Past Medical History:  Diagnosis Date  . ABNORMAL EKG 10/07/2008  . ADVEF, DRUG/MED/BIOL SUBST, OTHER DRUG NOS 09/26/2006  . Agent orange exposure   . Allergy    seasonal  . Cataract    left eye-surgery 07-2015  . DEGENERATIVE JOINT DISEASE, MODERATE 04/13/2007  . DIVERTICULOSIS, COLON 04/13/2007  . ELEVATED PROSTATE SPECIFIC ANTIGEN 04/14/2007  . GERD (gastroesophageal reflux disease)   . Glaucoma    sees [name]   . History of kidney stones    1970   . HYPERLIPIDEMIA 09/26/2006  . HYPERTENSION 04/13/2007  . JOINT EFFUSION, KNEE 06/15/2009  . Melanoma [facility])    sees [name]   . Numbness and tingling   . Pre-diabetes   . PROSTATE CANCER 10/05/2009   sees [name]  . Renal insufficiency    stage III  [facility]     Past Surgical History:  Procedure Laterality Date  . ACROMIOPLASTY Right 05-06-14   per [name]   . BUNIONECTOMY     bilateral  . CATARACT EXTRACTION Left    per [name]   . COLONOSCOPY  07/10/2015   per [name], benign polyp, repeat in 10 yrs   . CORONARY STENT INTERVENTION N/A 01/17/2018   Procedure: CORONARY STENT INTERVENTION;   Surgeon: [name], MD;  Location: [place] [facility];  Service: Cardiovascular;  Laterality: N/A;  . CYSTECTOMY     benign from hand  . GLAUCOMA SURGERY Bilateral 2014   . heart stent    . INSERTION OF MESH N/A 01/10/2013   Procedure: INSERTION OF MESH;  Surgeon: Earnstine Regal, MD;  Location: [place];  Service: General;  Laterality: N/A;  . KNEE ARTHROSCOPY Right Jan. 2013   right knee, per [name]   . KNEE ARTHROSCOPY Left 01-14-15   per [name]   . LEFT HEART CATH AND CORONARY ANGIOGRAPHY N/A 01/17/2018   Procedure: LEFT HEART CATH AND CORONARY ANGIOGRAPHY;  Surgeon: [name], MD;  Location: [place] [facility];  Service: Cardiovascular;  Laterality: N/A;  . LEFT HEART CATH AND CORONARY ANGIOGRAPHY N/A 04/06/2018   Procedure: LEFT HEART CATH AND CORONARY ANGIOGRAPHY;  Surgeon: [name], MD;  Location: [place] [facility];  Service: Cardiovascular;  Laterality: N/A;  . MELANOMA EXCISION  2012   per [name]  . PROSTATECTOMY     per [name]  . TONSILLECTOMY    . TOTAL KNEE ARTHROPLASTY Right 07/22/2019   Procedure: TOTAL KNEE ARTHROPLASTY;  Surgeon: Netta Cedars, MD;  [facility];  Service: Orthopedics;  Laterality: Right;  . TOTAL KNEE ARTHROPLASTY Left 07/10/2020   Procedure: TOTAL KNEE ARTHROPLASTY;  Surgeon: Netta Cedars, MD;  [facility];  Service: Orthopedics;  Laterality: Left;  . UMBILICAL HERNIA REPAIR  11/01/2011   Procedure: HERNIA REPAIR UMBILICAL ADULT;  Surgeon: Earnstine Regal, MD;  Location: [place];  Service: General;  Laterality: N/A;  Repair umbilical hernia with mesh patch  . VASECTOMY    . VENTRAL HERNIA REPAIR  01/10/2013   with mesh     [name]  . VENTRAL HERNIA REPAIR N/A 01/10/2013   Procedure: HERNIA REPAIR VENTRAL ADULT ;  Surgeon: Earnstine Regal, MD;  Location: [place];  Service: General;  Laterality: N/A;    There were no vitals filed for this visit.   Subjective Assessment - 07/20/20 0947    Subjective  COVID-19 screen performed prior to patient entering clinic. Reports more discomfort after sitting.    Pertinent History Right knee surgery, DJD, HTN, right shoulder.    Patient Stated Goals Get back to normal life.    Currently in Pain? Yes    Pain Score 3     Pain Location Knee    Pain Orientation Left    Pain Descriptors / Indicators Discomfort    Pain Type Surgical pain    Pain Onset 1 to 4 weeks ago    Pain Frequency Intermittent    Aggravating Factors  Weightbearing, sit> stands              [facility] PT Assessment - 07/20/20 0001      Assessment   Medical Diagnosis Left total knee replacement.    Referring Provider (PT) Esmond Plants MD    Onset Date/Surgical Date 07/10/20    Hand Dominance Left    Next MD Visit 07/28/2020      Precautions   Precaution Comments No ultrasound.      Restrictions   Weight Bearing Restrictions No      ROM / Strength   AROM / PROM / Strength AROM      AROM   Overall AROM  Deficits    AROM Assessment Site Knee    Right/Left Knee Left    Left Knee Extension 4    Left Knee Flexion 99                         [facility] Adult PT Treatment/Exercise - 07/20/20 0001      Knee/Hip Exercises: Aerobic   Nustep L3, seat 8-6 x18 min      Knee/Hip Exercises: Standing   Hip Flexion AROM;Left;2 sets;10 reps    Forward Lunges Left;2 sets;10 reps;2 seconds    Hip Abduction AROM;Left;2 sets;10 reps    Functional Squat 10 reps    Rocker Board 3 minutes      Knee/Hip Exercises: Supine   Short Arc Quad Sets Strengthening;Left;2 sets;10 reps    Straight Leg Raises AROM;Left;2 sets;10 reps      Modalities   Modalities Vasopneumatic      Vasopneumatic   Number Minutes Vasopneumatic  10 minutes    Vasopnuematic Location  Knee    Vasopneumatic Pressure Low    Vasopneumatic Temperature  34 for edema                       PT Long Term Goals - 07/13/20 1156      PT LONG TERM GOAL #1  Title Independent with a HEP.     Time 4    Period Weeks    Status New      PT LONG TERM GOAL #2   Title Pt will be able to amb with no device with step through gait pattern on community level surfaces.    Time 4    Period Weeks    Status New      PT LONG TERM GOAL #3   Title Pt will improve his left knee flexion to 125 degrees in order to be able to improve functional mobility and gait.    Time 4    Period Weeks      PT LONG TERM GOAL #4   Title Pt will be able to reach active left knee extension to -5 degrees.    Time 4    Period Weeks    Status New      PT LONG TERM GOAL #5   Title Pt will be able to climb a flight of stairs with one rail with no pain reported.    Time 4    Period Weeks    Status New      PT LONG TERM GOAL #6   Title Perform ADL's with pain not > 3/10    Time 4    Period Weeks    Status New                 Plan - 07/20/20 1107    Clinical Impression Statement Patient presented in clinic with reports of more discomfort after standing from sitting position. Patient able to progress per TKR for ROM and light strengthening. Patient able to demonstrate fairly good L quad activation. AROM of L knee measured as 4-99 deg. BLE TED hose donned but RLE had to be corrected as hose rolled at calf level. Normal vasopneumatic response noted following removal of the modality.    Personal Factors and Comorbidities Comorbidity 1;Comorbidity 2;Other    Comorbidities Right knee surgery, DJD, HTN, right shoulder.    Examination-Activity Limitations Other;Locomotion Level    Examination-Participation Restrictions Other    Stability/Clinical Decision Making Stable/Uncomplicated    Rehab Potential Excellent    PT Frequency 3x / week    PT Duration 4 weeks    PT Treatment/Interventions ADLs/Self Care Home Management;Cryotherapy;Electrical Stimulation;Moist Heat;Gait training;Stair training;Functional mobility training;Therapeutic activities;Therapeutic exercise;Neuromuscular re-education;Manual  techniques;Patient/family education;Passive range of motion;Vasopneumatic Device    PT Next Visit Plan FOTO...Marland KitchenMarland KitchenProgress through TKA protocol.  VMS to quadriceps, e'stim/Vaso.    Consulted and Agree with Plan of Care Patient           Patient will benefit from skilled therapeutic intervention in order to improve the following deficits and impairments:  Abnormal gait,Pain,Decreased activity tolerance,Decreased strength,Increased edema,Decreased range of motion  Visit Diagnosis: Chronic pain of left knee  Stiffness of left knee, not elsewhere classified  Localized edema  Muscle weakness (generalized)     Problem List Patient Active Problem List   Diagnosis Date Noted  . H/O total knee replacement, left 07/10/2020  . Hyperglycemia 05/13/2020  . Status post total knee replacement, right 07/22/2019  . Chronic pain of right knee 05/29/2019  . Numbness and tingling 05/29/2019  . CKD (chronic kidney disease) stage 3, GFR 30-59 ml/min (HCC) 05/20/2019  . Paresthesia 01/17/2019  . Upper airway cough syndrome 09/19/2018  . DOE (dyspnea on exertion) 08/08/2018  . Abnormal cardiac CT angiography 01/17/2018  . Angina, class II ([place]) 01/17/2018  . Incisional hernia 08/11/2011  .  PROSTATE CANCER 10/05/2009  . Coronary atherosclerosis 10/05/2009  . ABNORMAL EKG 10/07/2008  . Essential hypertension 04/13/2007  . DIVERTICULOSIS, COLON 04/13/2007  . Osteoarthritis 04/13/2007  . Hyperlipidemia with target LDL less than 70 09/26/2006    Standley Brooking, PTA 07/20/2020, 3:48 PM  [facility] [place], [place], [address] Phone: [phone]   Fax:  [phone]  Name: Ozan Mastro. MRN: [id number] Date of Birth: [birth year]

## 2020-07-21 NOTE — Discharge Summary (Signed)
In most cases prophylactic antibiotics for Dental procdeures after total joint surgery are not necessary.  Exceptions are as follows:  1. History of prior total joint infection  2. Severely immunocompromised (Organ Transplant, cancer chemotherapy, Rheumatoid biologic meds such as [place])  3. Poorly controlled diabetes (A1C &gt; 8.0, blood glucose over 200)  If you have one of these conditions, contact your surgeon for an antibiotic prescription, prior to your dental procedure. Orthopedic Discharge Summary        Physician Discharge Summary  Patient ID: [name] [name]. MRN: [id number] DOB/AGE: 1945-07-01 75 y.o.  Admit date: 07/10/2020 Discharge date: 07/12/20   Procedures:  Procedure(s) (LRB): TOTAL KNEE ARTHROPLASTY (Left)  Attending Physician:  Dr. Esmond Plants  Admission Diagnoses:   Left knee end stage osteoarthritis  Discharge Diagnoses:  Left knee end stage osteoarthritis   Past Medical History:  Diagnosis Date  . ABNORMAL EKG 10/07/2008  . ADVEF, DRUG/MED/BIOL SUBST, OTHER DRUG NOS 09/26/2006  . Agent orange exposure   . Allergy    seasonal  . Cataract    left eye-surgery 07-2015  . DEGENERATIVE JOINT DISEASE, MODERATE 04/13/2007  . DIVERTICULOSIS, COLON 04/13/2007  . ELEVATED PROSTATE SPECIFIC ANTIGEN 04/14/2007  . GERD (gastroesophageal reflux disease)   . Glaucoma    sees [name]   . History of kidney stones    1970   . HYPERLIPIDEMIA 09/26/2006  . HYPERTENSION 04/13/2007  . JOINT EFFUSION, KNEE 06/15/2009  . Melanoma [facility])    sees [name]   . Numbness and tingling   . Pre-diabetes   . PROSTATE CANCER 10/05/2009   sees [name]  . Renal insufficiency    stage III [name]     PCP: [name], MD   Discharged Condition: good  Hospital Course:  Patient underwent the above stated procedure on 07/10/2020. Patient tolerated the procedure well and brought to the recovery room in good condition and subsequently to the floor. Patient had  an uncomplicated hospital course and was stable for discharge.   Disposition: Discharge disposition: 01-Home or Self Care      with follow up in 2 weeks    Follow-up Information    Netta Cedars, MD. Call in 2 weeks.   Specialty: Orthopedic Surgery Why: [phone] Contact information: [address] [place] [address] [phone]               Dental Antibiotics:  In most cases prophylactic antibiotics for Dental procdeures after total joint surgery are not necessary.  Exceptions are as follows:  1. History of prior total joint infection  2. Severely immunocompromised (Organ Transplant, cancer chemotherapy, Rheumatoid biologic meds such as [place])  3. Poorly controlled diabetes (A1C &gt; 8.0, blood glucose over 200)  If you have one of these conditions, contact your surgeon for an antibiotic prescription, prior to your dental procedure.  Discharge Instructions    Call MD / Call 911   Complete by: As directed    If you experience chest pain or shortness of breath, CALL 911 and be transported to the hospital emergency room.  If you develope a fever above 101 F, pus (white drainage) or increased drainage or redness at the wound, or calf pain, call your surgeon's office.   Constipation Prevention   Complete by: As directed    Drink plenty of fluids.  Prune juice may be helpful.  You may use a stool softener, such as Colace (over the counter) 100 mg twice a  day.  Use MiraLax (over the counter) for constipation as needed.   Diet - low sodium heart healthy   Complete by: As directed    Increase activity slowly as tolerated   Complete by: As directed    Post-operative opioid taper instructions:   Complete by: As directed    POST-OPERATIVE OPIOID TAPER INSTRUCTIONS: It is important to wean off of your opioid medication as soon as possible. If you do not need pain medication after your surgery it is ok to stop day one. Opioids include: Codeine,  Hydrocodone(Norco, Vicodin), Oxycodone(Percocet, oxycontin) and hydromorphone amongst others.  Long term and even short term use of opiods can cause: Increased pain response Dependence Constipation Depression Respiratory depression And more.  Withdrawal symptoms can include Flu like symptoms Nausea, vomiting And more Techniques to manage these symptoms Hydrate well Eat regular healthy meals Stay active Use relaxation techniques(deep breathing, meditating, yoga) Do Not substitute Alcohol to help with tapering If you have been on opioids for less than two weeks and do not have pain than it is ok to stop all together.  Plan to wean off of opioids This plan should start within one week post op of your joint replacement. Maintain the same interval or time between taking each dose and first decrease the dose.  Cut the total daily intake of opioids by one tablet each day Next start to increase the time between doses. The last dose that should be eliminated is the evening dose.         Allergies as of 07/11/2020   No Known Allergies     Medication List    TAKE these medications   amLODipine 10 MG tablet Commonly known as: NORVASC Take 1 tablet (10 mg total) by mouth daily.   aspirin 81 MG EC tablet Take 1 tablet (81 mg total) by mouth in the morning and at bedtime. What changed: when to take this   bimatoprost 0.01 % Soln Commonly known as: LUMIGAN Place 1 drop into both eyes at bedtime.   clopidogrel 75 MG tablet Commonly known as: PLAVIX Take 1 tablet (75 mg total) by mouth daily with breakfast. What changed: when to take this   dorzolamide-timolol 22.3-6.8 MG/ML ophthalmic solution Commonly known as: COSOPT Place 1 drop into both eyes 2 (two) times daily.   famotidine 20 MG tablet Commonly known as: PEPCID Take 20 mg by mouth at bedtime.   fenofibrate 160 MG tablet Take 160 mg by mouth at bedtime.   fluticasone 50 MCG/ACT nasal spray Commonly known as:  FLONASE Place 1 spray into both nostrils daily as needed for allergies.   isosorbide mononitrate 30 MG 24 hr tablet Commonly known as: IMDUR Take 30 mg by mouth daily.   magnesium oxide 400 MG tablet Commonly known as: MAG-OX Take 400 mg by mouth every evening.   methocarbamol 500 MG tablet Commonly known as: Robaxin Take 1 tablet (500 mg total) by mouth every 8 (eight) hours as needed for muscle spasms.   nitroGLYCERIN 0.4 MG SL tablet Commonly known as: NITROSTAT Place 0.4 mg under the tongue every 5 (five) minutes as needed for chest pain.   ondansetron 4 MG tablet Commonly known as: Zofran Take 1 tablet (4 mg total) by mouth daily as needed for nausea or vomiting.   oxyCODONE-acetaminophen 5-325 MG tablet Commonly known as: Percocet Take 1 tablet by mouth every 4 (four) hours as needed for severe pain.   pantoprazole 40 MG tablet Commonly known as: PROTONIX Take 1 tablet (  40 mg total) by mouth daily.   rosuvastatin 20 MG tablet Commonly known as: CRESTOR Take 20 mg by mouth every evening.   telmisartan 80 MG tablet Commonly known as: Micardis Take 1 tablet (80 mg total) by mouth daily.   Vitamin D2 50 MCG (2000 UT) Tabs Take 2,000 Units by mouth daily.         Signed: Ventura Bruns 07/21/2020, 8:17 AM  [facility] is now [facility] [address] [place], [place], [place] [address] Phone: [place]

## 2020-07-23 ENCOUNTER — Ambulatory Visit: Payer: Medicare Other | Admitting: Physical Therapy

## 2020-07-23 ENCOUNTER — Other Ambulatory Visit: Payer: Self-pay

## 2020-07-23 DIAGNOSIS — M25662 Stiffness of left knee, not elsewhere classified: Secondary | ICD-10-CM

## 2020-07-23 DIAGNOSIS — G8929 Other chronic pain: Secondary | ICD-10-CM

## 2020-07-23 DIAGNOSIS — R600 Localized edema: Secondary | ICD-10-CM

## 2020-07-23 DIAGNOSIS — M25562 Pain in left knee: Secondary | ICD-10-CM

## 2020-07-23 DIAGNOSIS — M6281 Muscle weakness (generalized): Secondary | ICD-10-CM | POA: Diagnosis not present

## 2020-07-23 NOTE — Therapy (Signed)
[facility] [place], [place], [address] Phone: [phone]   Fax:  [phone]  Physical Therapy Treatment  Patient Details  Name: [name] [name]. MRN: [id number] Date of Birth: Sep 02, 1945 Referring Provider (PT): Esmond Plants MD   Encounter Date: 07/23/2020   PT End of Session - 07/23/20 0942    Visit Number 4    Number of Visits 12    Date for PT Re-Evaluation 08/10/20    Authorization Type FOTO AT LEAST EVERY 5TH VISIT.  PROGRESS NOTE AT 10TH VISIT.  KX MODIFIER AFTER 15 VISITS.    PT Start Time 0900    PT Stop Time 0959    PT Time Calculation (min) 59 min    Activity Tolerance Patient tolerated treatment well    Behavior During Therapy WFL for tasks assessed/performed           Past Medical History:  Diagnosis Date  . ABNORMAL EKG 10/07/2008  . ADVEF, DRUG/MED/BIOL SUBST, OTHER DRUG NOS 09/26/2006  . Agent orange exposure   . Allergy    seasonal  . Cataract    left eye-surgery 07-2015  . DEGENERATIVE JOINT DISEASE, MODERATE 04/13/2007  . DIVERTICULOSIS, COLON 04/13/2007  . ELEVATED PROSTATE SPECIFIC ANTIGEN 04/14/2007  . GERD (gastroesophageal reflux disease)   . Glaucoma    sees [name]   . History of kidney stones    1970   . HYPERLIPIDEMIA 09/26/2006  . HYPERTENSION 04/13/2007  . JOINT EFFUSION, KNEE 06/15/2009  . Melanoma [facility])    sees [name]   . Numbness and tingling   . Pre-diabetes   . PROSTATE CANCER 10/05/2009   sees [name]  . Renal insufficiency    stage III [facility]     Past Surgical History:  Procedure Laterality Date  . ACROMIOPLASTY Right 05-06-14   per [name]   . BUNIONECTOMY     bilateral  . CATARACT EXTRACTION Left    per [name]   . COLONOSCOPY  07/10/2015   per [name], benign polyp, repeat in 10 yrs   . CORONARY STENT INTERVENTION N/A 01/17/2018   Procedure: CORONARY STENT INTERVENTION;  Surgeon: [name], MD;  Location: [place] [facility];   Service: Cardiovascular;  Laterality: N/A;  . CYSTECTOMY     benign from hand  . GLAUCOMA SURGERY Bilateral 2014   . heart stent    . INSERTION OF MESH N/A 01/10/2013   Procedure: INSERTION OF MESH;  Surgeon: Earnstine Regal, MD;  Location: [place];  Service: General;  Laterality: N/A;  . KNEE ARTHROSCOPY Right Jan. 2013   right knee, per [name]   . KNEE ARTHROSCOPY Left 01-14-15   per [name]   . LEFT HEART CATH AND CORONARY ANGIOGRAPHY N/A 01/17/2018   Procedure: LEFT HEART CATH AND CORONARY ANGIOGRAPHY;  Surgeon: [name], MD;  Location: [place] [facility];  Service: Cardiovascular;  Laterality: N/A;  . LEFT HEART CATH AND CORONARY ANGIOGRAPHY N/A 04/06/2018   Procedure: LEFT HEART CATH AND CORONARY ANGIOGRAPHY;  Surgeon: [name], MD;  Location: [place] [facility];  Service: Cardiovascular;  Laterality: N/A;  . MELANOMA EXCISION  2012   per [name]  . PROSTATECTOMY     per [name]  . TONSILLECTOMY    . TOTAL KNEE ARTHROPLASTY Right 07/22/2019   Procedure: TOTAL KNEE ARTHROPLASTY;  Surgeon: Netta Cedars, MD;  Location: [facility];  Service: Orthopedics;  Laterality: Right;  .  TOTAL KNEE ARTHROPLASTY Left 07/10/2020   Procedure: TOTAL KNEE ARTHROPLASTY;  Surgeon: Netta Cedars, MD;  Location: [facility];  Service: Orthopedics;  Laterality: Left;  . UMBILICAL HERNIA REPAIR  11/01/2011   Procedure: HERNIA REPAIR UMBILICAL ADULT;  Surgeon: Earnstine Regal, MD;  Location: [place];  Service: General;  Laterality: N/A;  Repair umbilical hernia with mesh patch  . VASECTOMY    . VENTRAL HERNIA REPAIR  01/10/2013   with mesh     [name]  . VENTRAL HERNIA REPAIR N/A 01/10/2013   Procedure: HERNIA REPAIR VENTRAL ADULT ;  Surgeon: Earnstine Regal, MD;  Location: [place];  Service: General;  Laterality: N/A;    There were no vitals filed for this visit.   Subjective Assessment - 07/23/20 0941    Subjective COVID-19 screen performed prior to patient entering clinic.   Doing good.    Pertinent History Right knee surgery, DJD, HTN, right shoulder.    Patient Stated Goals Get back to normal life.    Currently in Pain? Yes    Pain Score 3     Pain Location Knee    Pain Orientation Left    Pain Descriptors / Indicators Discomfort    Pain Onset 1 to 4 weeks ago                             [facility] Adult PT Treatment/Exercise - 07/23/20 0001      Exercises   Exercises Knee/Hip      Knee/Hip Exercises: Aerobic   Nustep Level 3 x 15 minutes moving seat forward x 2 to increase flexion.      Knee/Hip Exercises: Supine   Short Arc Quad Sets Limitations 16 minute 3# left SAQ's facilitated with Bi-Phasic electrical stimulation with 10 sec extension holds f/b 10 sec rest.      Vasopneumatic   Number Minutes Vasopneumatic  15 minutes    Vasopnuematic Location  --   Left knee.   Vasopneumatic Pressure Low      Manual Therapy   Manual Therapy Passive ROM    Passive ROM In supine:  PROM into flexion with low load long duration stretching technique utilized x 3 minutes.                       PT Long Term Goals - 07/13/20 1156      PT LONG TERM GOAL #1   Title Independent with a HEP.    Time 4    Period Weeks    Status New      PT LONG TERM GOAL #2   Title Pt will be able to amb with no device with step through gait pattern on community level surfaces.    Time 4    Period Weeks    Status New      PT LONG TERM GOAL #3   Title Pt will improve his left knee flexion to 125 degrees in order to be able to improve functional mobility and gait.    Time 4    Period Weeks      PT LONG TERM GOAL #4   Title Pt will be able to reach active left knee extension to -5 degrees.    Time 4    Period Weeks    Status New      PT LONG TERM GOAL #5   Title Pt will be able to climb a flight  of stairs with one rail with no pain reported.    Time 4    Period Weeks    Status New      PT LONG TERM GOAL #6   Title Perform ADL's with  pain not > 3/10    Time 4    Period Weeks    Status New                 Plan - 07/23/20 1007    Clinical Impression Statement The patient is making excellent progress.  He did great today with a 3# SAQ's today.  Plan to progress him to recumbent bike next week.    Personal Factors and Comorbidities Comorbidity 1;Comorbidity 2;Other    Comorbidities Right knee surgery, DJD, HTN, right shoulder.    Examination-Activity Limitations Other;Locomotion Level    Examination-Participation Restrictions Other    Stability/Clinical Decision Making Stable/Uncomplicated    Rehab Potential Excellent    PT Frequency 3x / week    PT Duration 4 weeks    PT Treatment/Interventions ADLs/Self Care Home Management;Cryotherapy;Electrical Stimulation;Moist Heat;Gait training;Stair training;Functional mobility training;Therapeutic activities;Therapeutic exercise;Neuromuscular re-education;Manual techniques;Patient/family education;Passive range of motion;Vasopneumatic Device    PT Next Visit Plan Progress to recumbent bike.    Consulted and Agree with Plan of Care Patient           Patient will benefit from skilled therapeutic intervention in order to improve the following deficits and impairments:  Abnormal gait,Pain,Decreased activity tolerance,Decreased strength,Increased edema,Decreased range of motion  Visit Diagnosis: Chronic pain of left knee  Stiffness of left knee, not elsewhere classified  Localized edema     Problem List Patient Active Problem List   Diagnosis Date Noted  . H/O total knee replacement, left 07/10/2020  . Hyperglycemia 05/13/2020  . Status post total knee replacement, right 07/22/2019  . Chronic pain of right knee 05/29/2019  . Numbness and tingling 05/29/2019  . CKD (chronic kidney disease) stage 3, GFR 30-59 ml/min (HCC) 05/20/2019  . Paresthesia 01/17/2019  . Upper airway cough syndrome 09/19/2018  . DOE (dyspnea on exertion) 08/08/2018  . Abnormal cardiac  CT angiography 01/17/2018  . Angina, class II ([place]) 01/17/2018  . Incisional hernia 08/11/2011  . PROSTATE CANCER 10/05/2009  . Coronary atherosclerosis 10/05/2009  . ABNORMAL EKG 10/07/2008  . Essential hypertension 04/13/2007  . DIVERTICULOSIS, COLON 04/13/2007  . Osteoarthritis 04/13/2007  . Hyperlipidemia with target LDL less than 70 09/26/2006    Taleya Whitcher, [place] MPT 07/23/2020, 10:18 AM  [facility] [address] [place], [place], [address] Phone: [phone]   Fax:  [phone]  Name: [name] [name]. MRN: [id number] Date of Birth: 1946-02-01

## 2020-07-27 ENCOUNTER — Ambulatory Visit: Payer: Medicare Other

## 2020-07-27 ENCOUNTER — Other Ambulatory Visit: Payer: Self-pay

## 2020-07-27 DIAGNOSIS — R600 Localized edema: Secondary | ICD-10-CM

## 2020-07-27 DIAGNOSIS — M6281 Muscle weakness (generalized): Secondary | ICD-10-CM | POA: Diagnosis not present

## 2020-07-27 DIAGNOSIS — M25662 Stiffness of left knee, not elsewhere classified: Secondary | ICD-10-CM | POA: Diagnosis not present

## 2020-07-27 DIAGNOSIS — G8929 Other chronic pain: Secondary | ICD-10-CM | POA: Diagnosis not present

## 2020-07-27 DIAGNOSIS — M25562 Pain in left knee: Secondary | ICD-10-CM | POA: Diagnosis not present

## 2020-07-27 NOTE — Therapy (Signed)
[facility] [place], [place], [address] Phone: [phone]   Fax:  [phone]  Physical Therapy Treatment  Patient Details  Name: [name] [name]. MRN: [id number] Date of Birth: November 10, 1945 Referring Provider (PT): Esmond Plants MD   Encounter Date: 07/27/2020   PT End of Session - 07/27/20 1015    Visit Number 5    Number of Visits 12    Date for PT Re-Evaluation 08/10/20    Authorization Type FOTO AT LEAST EVERY 5TH VISIT.  PROGRESS NOTE AT 10TH VISIT.  KX MODIFIER AFTER 15 VISITS.    PT Start Time 0900    PT Stop Time 1000    PT Time Calculation (min) 60 min    Equipment Utilized During Treatment Other (comment)   FWW and SPC   Activity Tolerance Patient tolerated treatment well    Behavior During Therapy WFL for tasks assessed/performed           Past Medical History:  Diagnosis Date  . ABNORMAL EKG 10/07/2008  . ADVEF, DRUG/MED/BIOL SUBST, OTHER DRUG NOS 09/26/2006  . Agent orange exposure   . Allergy    seasonal  . Cataract    left eye-surgery 07-2015  . DEGENERATIVE JOINT DISEASE, MODERATE 04/13/2007  . DIVERTICULOSIS, COLON 04/13/2007  . ELEVATED PROSTATE SPECIFIC ANTIGEN 04/14/2007  . GERD (gastroesophageal reflux disease)   . Glaucoma    sees [name]   . History of kidney stones    1970   . HYPERLIPIDEMIA 09/26/2006  . HYPERTENSION 04/13/2007  . JOINT EFFUSION, KNEE 06/15/2009  . Melanoma [facility])    sees [name]   . Numbness and tingling   . Pre-diabetes   . PROSTATE CANCER 10/05/2009   sees [name]  . Renal insufficiency    stage III [facility]     Past Surgical History:  Procedure Laterality Date  . ACROMIOPLASTY Right 05-06-14   per [name]   . BUNIONECTOMY     bilateral  . CATARACT EXTRACTION Left    per [name]   . COLONOSCOPY  07/10/2015   per [name], benign polyp, repeat in 10 yrs   . CORONARY STENT INTERVENTION N/A 01/17/2018   Procedure: CORONARY STENT INTERVENTION;   Surgeon: [name], MD;  Location: [place] [facility];  Service: Cardiovascular;  Laterality: N/A;  . CYSTECTOMY     benign from hand  . GLAUCOMA SURGERY Bilateral 2014   . heart stent    . INSERTION OF MESH N/A 01/10/2013   Procedure: INSERTION OF MESH;  Surgeon: Earnstine Regal, MD;  Location: [place];  Service: General;  Laterality: N/A;  . KNEE ARTHROSCOPY Right Jan. 2013   right knee, per [name]   . KNEE ARTHROSCOPY Left 01-14-15   per [name]   . LEFT HEART CATH AND CORONARY ANGIOGRAPHY N/A 01/17/2018   Procedure: LEFT HEART CATH AND CORONARY ANGIOGRAPHY;  Surgeon: [name], MD;  Location: [place] [facility];  Service: Cardiovascular;  Laterality: N/A;  . LEFT HEART CATH AND CORONARY ANGIOGRAPHY N/A 04/06/2018   Procedure: LEFT HEART CATH AND CORONARY ANGIOGRAPHY;  Surgeon: [name], MD;  Location: [place] [facility];  Service: Cardiovascular;  Laterality: N/A;  . MELANOMA EXCISION  2012   per [name]  . PROSTATECTOMY     per [name]  . TONSILLECTOMY    . TOTAL KNEE ARTHROPLASTY Right 07/22/2019   Procedure: TOTAL KNEE ARTHROPLASTY;  Surgeon: [name],  Richardson Landry, MD;  Location: [facility];  Service: Orthopedics;  Laterality: Right;  . TOTAL KNEE ARTHROPLASTY Left 07/10/2020   Procedure: TOTAL KNEE ARTHROPLASTY;  Surgeon: Netta Cedars, MD;  Location: [facility];  Service: Orthopedics;  Laterality: Left;  . UMBILICAL HERNIA REPAIR  11/01/2011   Procedure: HERNIA REPAIR UMBILICAL ADULT;  Surgeon: Earnstine Regal, MD;  Location: [place];  Service: General;  Laterality: N/A;  Repair umbilical hernia with mesh patch  . VASECTOMY    . VENTRAL HERNIA REPAIR  01/10/2013   with mesh     [name]  . VENTRAL HERNIA REPAIR N/A 01/10/2013   Procedure: HERNIA REPAIR VENTRAL ADULT ;  Surgeon: Earnstine Regal, MD;  Location: [place];  Service: General;  Laterality: N/A;    There were no vitals filed for this visit.   Subjective Assessment - 07/27/20 1002     Subjective COVID-19 screen performed prior to patient entering clinic.  "I have been restless in bed,  Iwill go back to sleeping in the lazyboy. I wrap it at night. I have a doctors appt tomorrow and I believe they will take out the stitches. I have been walking with the walker in the house but oculd proably start using the cane"    Pertinent History Right knee surgery, DJD, HTN, right shoulder.    Patient Stated Goals Get back to normal life.    Currently in Pain? Yes    Pain Score 3     Pain Location Knee    Pain Orientation Left    Pain Descriptors / Indicators Tightness    Pain Type Surgical pain    Pain Onset 1 to 4 weeks ago              [facility] PT Assessment - 07/27/20 0900      Assessment   Medical Diagnosis Left total knee replacement.    Referring Provider (PT) Esmond Plants MD    Onset Date/Surgical Date 07/10/20    Hand Dominance Left    Next MD Visit 07/28/2020      Precautions   Precaution Comments No ultrasound.      Restrictions   Weight Bearing Restrictions No      Home Environment   Living Environment Private residence    Type of [place] entrance;Stairs to enter    Entrance Stairs-Number of Steps 5    Entrance Stairs-Rails Right      Prior Function   Level of Independence Independent      Observation/Other Assessments   Observations steristrips over incision; redness at center of incision; wound healing well in appearance; TED hose donned;  moderate diffuse hematoma at the proximal gastroc atachment - non tender ;                         [facility] Adult PT Treatment/Exercise - 07/27/20 0900      Ambulation/Gait   Ambulation/Gait Yes    Ambulation Distance (Feet) 30 Feet    Gait Pattern Step-to pattern    Gait Comments with SPC training ; VCs in // bars      Knee/Hip Exercises: Stretches   Active Hamstring Stretch Limitations seated hamstring stretch 5 x 15 sec      Knee/Hip Exercises: Aerobic   Stationary Bike  lvl 1 ; 10 min; seat at 5 for 5 mns; seat at 2 for 5 mins      Knee/Hip Exercises: Supine  Quad Sets --   Prone Toe props   Heel Slides AAROM    Heel Slides Limitations 119deg flexion    Terminal Knee Extension 2 sets;10 reps;Other (comment)   supine with ball behind the knee     Knee/Hip Exercises: Prone   Straight Leg Raises 2 sets;10 reps    Other Prone Exercises toe props; 10 x 5 sec      Vasopneumatic   Number Minutes Vasopneumatic  15 minutes    Vasopnuematic Location  Knee    Vasopneumatic Pressure Medium    Vasopneumatic Temperature  34 for edema      Manual Therapy   Manual Therapy Edema management;Joint mobilization;Soft tissue mobilization    Manual therapy comments responds well with improved mobility of the L knee    Edema Management gentle efflurage to LLE    Joint Mobilization supine tibiofemoral PA mob gr III    Soft tissue mobilization In supine, pes anserine mobilization    Passive ROM In supine:  PROM into flexion with low load long duration stretching technique utilized x 3 minutes.                  PT Education - 07/27/20 1014    Education Details Gait traingin and safety ambulating with SPC; contiued assessment of skin and hematoma    Person(s) Educated Patient    Comprehension Verbalized understanding               PT Long Term Goals - 07/13/20 1156      PT LONG TERM GOAL #1   Title Independent with a HEP.    Time 4    Period Weeks    Status New      PT LONG TERM GOAL #2   Title Pt will be able to amb with no device with step through gait pattern on community level surfaces.    Time 4    Period Weeks    Status New      PT LONG TERM GOAL #3   Title Pt will improve his left knee flexion to 125 degrees in order to be able to improve functional mobility and gait.    Time 4    Period Weeks      PT LONG TERM GOAL #4   Title Pt will be able to reach active left knee extension to -5 degrees.    Time 4    Period Weeks    Status New       PT LONG TERM GOAL #5   Title Pt will be able to climb a flight of stairs with one rail with no pain reported.    Time 4    Period Weeks    Status New      PT LONG TERM GOAL #6   Title Perform ADL's with pain not > 3/10    Time 4    Period Weeks    Status New                 Plan - 07/27/20 1016    Clinical Impression Statement The patient continues to demonstrate excellent progress with adequate quad control and strength. Today the patient was educated on [facility] ambulation and progressed to ful lrevolutions on recumbent bke. He demonstrates mild deficits with hip extension secondary to antalgic gait hownever progressed when facilitated in // bars. Skilled PT recommended to continue in order to progress patient to prior level of funciton and restore quality of life .  Personal Factors and Comorbidities Comorbidity 1;Comorbidity 2;Other    Comorbidities Right knee surgery, DJD, HTN, right shoulder.    Examination-Activity Limitations Other;Locomotion Level    Examination-Participation Restrictions Other    Stability/Clinical Decision Making Stable/Uncomplicated    Clinical Decision Making Low    Rehab Potential Excellent    PT Frequency 3x / week    PT Duration 4 weeks    PT Treatment/Interventions ADLs/Self Care Home Management;Cryotherapy;Electrical Stimulation;Moist Heat;Gait training;Stair training;Functional mobility training;Therapeutic activities;Therapeutic exercise;Neuromuscular re-education;Manual techniques;Patient/family education;Passive range of motion;Vasopneumatic Device    PT Next Visit Plan Review HEP; educate on scar desensitization; proximal hip strength    Consulted and Agree with Plan of Care Patient           Patient will benefit from skilled therapeutic intervention in order to improve the following deficits and impairments:  Abnormal gait,Pain,Decreased activity tolerance,Decreased strength,Increased edema,Decreased range of motion  Visit  Diagnosis: Chronic pain of left knee  Stiffness of left knee, not elsewhere classified  Localized edema  Muscle weakness (generalized)     Problem List Patient Active Problem List   Diagnosis Date Noted  . H/O total knee replacement, left 07/10/2020  . Hyperglycemia 05/13/2020  . Status post total knee replacement, right 07/22/2019  . Chronic pain of right knee 05/29/2019  . Numbness and tingling 05/29/2019  . CKD (chronic kidney disease) stage 3, GFR 30-59 ml/min (HCC) 05/20/2019  . Paresthesia 01/17/2019  . Upper airway cough syndrome 09/19/2018  . DOE (dyspnea on exertion) 08/08/2018  . Abnormal cardiac CT angiography 01/17/2018  . Angina, class II ([place]) 01/17/2018  . Incisional hernia 08/11/2011  . PROSTATE CANCER 10/05/2009  . Coronary atherosclerosis 10/05/2009  . ABNORMAL EKG 10/07/2008  . Essential hypertension 04/13/2007  . DIVERTICULOSIS, COLON 04/13/2007  . Osteoarthritis 04/13/2007  . Hyperlipidemia with target LDL less than 70 09/26/2006    [name] [name] PT, DPT 07/27/2020, 10:32 AM  [facility] [address] [place], [place], [address] Phone: [phone]   Fax:  [phone]  Name: [name] [name]. MRN: [id number] Date of Birth: Dec 09, 1945

## 2020-07-31 ENCOUNTER — Ambulatory Visit: Payer: Medicare Other

## 2020-07-31 ENCOUNTER — Other Ambulatory Visit: Payer: Self-pay

## 2020-07-31 DIAGNOSIS — R600 Localized edema: Secondary | ICD-10-CM

## 2020-07-31 DIAGNOSIS — G8929 Other chronic pain: Secondary | ICD-10-CM | POA: Diagnosis not present

## 2020-07-31 DIAGNOSIS — M25662 Stiffness of left knee, not elsewhere classified: Secondary | ICD-10-CM

## 2020-07-31 DIAGNOSIS — M6281 Muscle weakness (generalized): Secondary | ICD-10-CM | POA: Diagnosis not present

## 2020-07-31 DIAGNOSIS — M25562 Pain in left knee: Secondary | ICD-10-CM | POA: Diagnosis not present

## 2020-07-31 NOTE — Therapy (Signed)
[facility] [place], [place], [address] Phone: [phone]   Fax:  [phone]  Physical Therapy Treatment  Patient Details  Name: [name] [name]. MRN: [id number] Date of Birth: [birth year] Referring Provider (PT): Esmond Plants MD   Encounter Date: 07/31/2020   PT End of Session - 07/31/20 0957    Visit Number 6    Number of Visits 12    Date for PT Re-Evaluation 08/10/20    PT Start Time 0900    PT Stop Time 1010    PT Time Calculation (min) 70 min    Activity Tolerance Patient tolerated treatment well    Behavior During Therapy [facility] for tasks assessed/performed           Past Medical History:  Diagnosis Date  . ABNORMAL EKG 10/07/2008  . ADVEF, DRUG/MED/BIOL SUBST, OTHER DRUG NOS 09/26/2006  . Agent orange exposure   . Allergy    seasonal  . Cataract    left eye-surgery 07-2015  . DEGENERATIVE JOINT DISEASE, MODERATE 04/13/2007  . DIVERTICULOSIS, COLON 04/13/2007  . ELEVATED PROSTATE SPECIFIC ANTIGEN 04/14/2007  . GERD (gastroesophageal reflux disease)   . Glaucoma    sees [name]   . History of kidney stones    1970   . HYPERLIPIDEMIA 09/26/2006  . HYPERTENSION 04/13/2007  . JOINT EFFUSION, KNEE 06/15/2009  . Melanoma [facility])    sees [name]   . Numbness and tingling   . Pre-diabetes   . PROSTATE CANCER 10/05/2009   sees [name]  . Renal insufficiency    stage III [facility]     Past Surgical History:  Procedure Laterality Date  . ACROMIOPLASTY Right 05-06-14   per [name]   . BUNIONECTOMY     bilateral  . CATARACT EXTRACTION Left    per [name]   . COLONOSCOPY  07/10/2015   per [name], benign polyp, repeat in 10 yrs   . CORONARY STENT INTERVENTION N/A 01/17/2018   Procedure: CORONARY STENT INTERVENTION;  Surgeon: [name], MD;  Location: [place] [facility];  Service: Cardiovascular;  Laterality: N/A;  . CYSTECTOMY     benign from hand  . GLAUCOMA SURGERY Bilateral 2014    . heart stent    . INSERTION OF MESH N/A 01/10/2013   Procedure: INSERTION OF MESH;  Surgeon: Earnstine Regal, MD;  Location: [place];  Service: General;  Laterality: N/A;  . KNEE ARTHROSCOPY Right Jan. 2013   right knee, per [name]   . KNEE ARTHROSCOPY Left 01-14-15   per [name]   . LEFT HEART CATH AND CORONARY ANGIOGRAPHY N/A 01/17/2018   Procedure: LEFT HEART CATH AND CORONARY ANGIOGRAPHY;  Surgeon: [name], MD;  Location: [place] [facility];  Service: Cardiovascular;  Laterality: N/A;  . LEFT HEART CATH AND CORONARY ANGIOGRAPHY N/A 04/06/2018   Procedure: LEFT HEART CATH AND CORONARY ANGIOGRAPHY;  Surgeon: [name], MD;  Location: [place] [facility];  Service: Cardiovascular;  Laterality: N/A;  . MELANOMA EXCISION  2012   per [name]  . PROSTATECTOMY     per [name]  . TONSILLECTOMY    . TOTAL KNEE ARTHROPLASTY Right 07/22/2019   Procedure: TOTAL KNEE ARTHROPLASTY;  Surgeon: Netta Cedars, MD;  [facility];  Service: Orthopedics;  Laterality: Right;  . TOTAL KNEE ARTHROPLASTY Left 07/10/2020   Procedure: TOTAL KNEE ARTHROPLASTY;  Surgeon: Netta Cedars, MD;  Location: [facility];  Service:  Orthopedics;  Laterality: Left;  . UMBILICAL HERNIA REPAIR  11/01/2011   Procedure: HERNIA REPAIR UMBILICAL ADULT;  Surgeon: Earnstine Regal, MD;  Location: [place];  Service: General;  Laterality: N/A;  Repair umbilical hernia with mesh patch  . VASECTOMY    . VENTRAL HERNIA REPAIR  01/10/2013   with mesh     [name]  . VENTRAL HERNIA REPAIR N/A 01/10/2013   Procedure: HERNIA REPAIR VENTRAL ADULT ;  Surgeon: Earnstine Regal, MD;  Location: [place];  Service: General;  Laterality: N/A;    There were no vitals filed for this visit.   Subjective Assessment - 07/31/20 0909    Subjective COVID-19 screen performed prior to patient entering clinic.Patient states that is MD appt went well ans he was told he is healing excently. He was cleared to drive and drove to  clinic today. He states that he did not sleep too well last night but has been feeling better. He walks around with a cane in the community but states that he does not use anything at home anymore.    Pertinent History Right knee surgery, DJD, HTN, right shoulder.    Patient Stated Goals Get back to normal life.    Pain Score 3     Pain Location Knee    Pain Orientation Left    Pain Descriptors / Indicators Aching    Pain Type Surgical pain              [facility] PT Assessment - 07/31/20 0902      Assessment   Next MD Visit 09/08/2020                         [facility] Adult PT Treatment/Exercise - 07/31/20 0902      Ambulation/Gait   Ambulation/Gait Yes    Ambulation Distance (Feet) 250 Feet    Assistive device Straight cane    Gait Pattern Step-to pattern    Gait Comments with SPC training ; VCs in // bars      Exercises   Exercises Knee/Hip      Knee/Hip Exercises: Stretches   Active Hamstring Stretch Limitations seated hamstring stretch 5 x 15 sec      Knee/Hip Exercises: Aerobic   Stationary Bike lvl 3 ; 13mins seat at 4    Nustep --      Knee/Hip Exercises: Standing   Hip Flexion AROM;Left;2 sets;10 reps    Forward Lunges Left;2 sets;10 reps;2 seconds    Hip Abduction Knee straight;Stengthening;15 reps    Hip Extension Knee straight;Stengthening;15 reps    Forward Step Up 10 reps;Hand Hold: 2    Step Down 10 reps;Hand Hold: 2    Functional Squat --    Rocker Board 1 minute    Other Standing Knee Exercises Standing lunge in onto 14" of steps ; 10 x 10sec holds    Other Standing Knee Exercises step up onto 6" step with contralateral knee drive x 10 ea      Knee/Hip Exercises: Seated   Sit to Sand 15 reps      Knee/Hip Exercises: Supine   Quad Sets --   Prone Toe props   Short Arc Quad Sets Strengthening;Left;2 sets;10 reps    Heel Slides AAROM    Heel Slides Limitations 125    Terminal Knee Extension 2 sets;10 reps;Other (comment)   supine with ball  behind the knee   Straight Leg Raises --  Knee/Hip Exercises: Prone   Straight Leg Raises --    Other Prone Exercises --      Modalities   Modalities Vasopneumatic      Vasopneumatic   Number Minutes Vasopneumatic  15 minutes    Vasopnuematic Location  Knee    Vasopneumatic Pressure Medium    Vasopneumatic Temperature  34 for edema      Manual Therapy   Manual Therapy Edema management;Joint mobilization;Soft tissue mobilization    Manual therapy comments responds well with improved mobility of the L knee    Edema Management gentle efflurage to LLE    Joint Mobilization supine tibiofemoral PA mob gr III    Soft tissue mobilization STM to ant tib and gastroc    Passive ROM In supine:  PROM into flexion with low load long duration stretching technique utilized x 3 minutes.                       PT Long Term Goals - 07/13/20 1156      PT LONG TERM GOAL #1   Title Independent with a HEP.    Time 4    Period Weeks    Status New      PT LONG TERM GOAL #2   Title Pt will be able to amb with no device with step through gait pattern on community level surfaces.    Time 4    Period Weeks    Status New      PT LONG TERM GOAL #3   Title Pt will improve his left knee flexion to 125 degrees in order to be able to improve functional mobility and gait.    Time 4    Period Weeks      PT LONG TERM GOAL #4   Title Pt will be able to reach active left knee extension to -5 degrees.    Time 4    Period Weeks    Status New      PT LONG TERM GOAL #5   Title Pt will be able to climb a flight of stairs with one rail with no pain reported.    Time 4    Period Weeks    Status New      PT LONG TERM GOAL #6   Title Perform ADL's with pain not > 3/10    Time 4    Period Weeks    Status New                 Plan - 07/31/20 0957    Clinical Impression Statement Mr. Memmer participates well in PT with emphasis on mobility and functional strength. Pt was able to  participate in step ups this visit with contralateral knee drive for hip mobility and single leg balane/strength emphasis. Pt demonstrates improved knee flexion and extension and is progressing well towards goals with functional strenght and mobility. Progression for standing balance and walking endurance on unlevel surfaces to be included next visit. Skilled PT recommended to address present deficits.    Personal Factors and Comorbidities Comorbidity 1;Comorbidity 2;Other    Stability/Clinical Decision Making Stable/Uncomplicated    Clinical Decision Making Low    Rehab Potential Excellent    PT Frequency 3x / week    PT Duration 4 weeks    PT Treatment/Interventions ADLs/Self Care Home Management;Cryotherapy;Electrical Stimulation;Moist Heat;Gait training;Stair training;Functional mobility training;Therapeutic activities;Therapeutic exercise;Neuromuscular re-education;Manual techniques;Patient/family education;Passive range of motion;Vasopneumatic Device    PT Next Visit Plan Hip strength, gait  on foam           Patient will benefit from skilled therapeutic intervention in order to improve the following deficits and impairments:  Abnormal gait,Pain,Decreased activity tolerance,Decreased strength,Increased edema,Decreased range of motion  Visit Diagnosis: Chronic pain of left knee  Stiffness of left knee, not elsewhere classified  Localized edema  Muscle weakness (generalized)     Problem List Patient Active Problem List   Diagnosis Date Noted  . H/O total knee replacement, left 07/10/2020  . Hyperglycemia 05/13/2020  . Status post total knee replacement, right 07/22/2019  . Chronic pain of right knee 05/29/2019  . Numbness and tingling 05/29/2019  . CKD (chronic kidney disease) stage 3, GFR 30-59 ml/min (HCC) 05/20/2019  . Paresthesia 01/17/2019  . Upper airway cough syndrome 09/19/2018  . DOE (dyspnea on exertion) 08/08/2018  . Abnormal cardiac CT angiography 01/17/2018  .  Angina, class II ([place]) 01/17/2018  . Incisional hernia 08/11/2011  . PROSTATE CANCER 10/05/2009  . Coronary atherosclerosis 10/05/2009  . ABNORMAL EKG 10/07/2008  . Essential hypertension 04/13/2007  . DIVERTICULOSIS, COLON 04/13/2007  . Osteoarthritis 04/13/2007  . Hyperlipidemia with target LDL less than 70 09/26/2006    Marylou Mccoy PT, DPT 07/31/2020, 10:19 AM  [facility] [address] [place], [place], [address] Phone: [phone]   Fax:  [phone]  Name: Florentino Laabs. MRN: [id number] Date of Birth: Nov 09, 1945

## 2020-08-05 ENCOUNTER — Other Ambulatory Visit: Payer: Self-pay

## 2020-08-05 ENCOUNTER — Encounter: Payer: Self-pay | Admitting: Physical Therapy

## 2020-08-05 ENCOUNTER — Ambulatory Visit: Payer: Medicare Other | Attending: Orthopedic Surgery | Admitting: Physical Therapy

## 2020-08-05 DIAGNOSIS — M6281 Muscle weakness (generalized): Secondary | ICD-10-CM

## 2020-08-05 DIAGNOSIS — G8929 Other chronic pain: Secondary | ICD-10-CM | POA: Diagnosis not present

## 2020-08-05 DIAGNOSIS — M25662 Stiffness of left knee, not elsewhere classified: Secondary | ICD-10-CM | POA: Diagnosis not present

## 2020-08-05 DIAGNOSIS — M25562 Pain in left knee: Secondary | ICD-10-CM | POA: Diagnosis not present

## 2020-08-05 DIAGNOSIS — R600 Localized edema: Secondary | ICD-10-CM | POA: Diagnosis not present

## 2020-08-05 NOTE — Therapy (Signed)
[facility] [place], [place], [address] Phone: [phone]   Fax:  [phone]  Physical Therapy Treatment  Patient Details  Name: [name] [name]. MRN: [id number] Date of Birth: 03-Nov-1945 Referring Provider (PT): Esmond Plants MD   Encounter Date: 08/05/2020   PT End of Session - 08/05/20 0934    Visit Number 7    Number of Visits 12    Date for PT Re-Evaluation 08/10/20    Authorization Type FOTO AT LEAST EVERY 5TH VISIT.  PROGRESS NOTE AT 10TH VISIT.  KX MODIFIER AFTER 15 VISITS.    PT Start Time [phone]    PT Stop Time 0937    PT Time Calculation (min) 39 min    Activity Tolerance Patient tolerated treatment well    Behavior During Therapy [facility] for tasks assessed/performed           Past Medical History:  Diagnosis Date  . ABNORMAL EKG 10/07/2008  . ADVEF, DRUG/MED/BIOL SUBST, OTHER DRUG NOS 09/26/2006  . Agent orange exposure   . Allergy    seasonal  . Cataract    left eye-surgery 07-2015  . DEGENERATIVE JOINT DISEASE, MODERATE 04/13/2007  . DIVERTICULOSIS, COLON 04/13/2007  . ELEVATED PROSTATE SPECIFIC ANTIGEN 04/14/2007  . GERD (gastroesophageal reflux disease)   . Glaucoma    sees [name]   . History of kidney stones    1970   . HYPERLIPIDEMIA 09/26/2006  . HYPERTENSION 04/13/2007  . JOINT EFFUSION, KNEE 06/15/2009  . Melanoma [facility])    sees [name]   . Numbness and tingling   . Pre-diabetes   . PROSTATE CANCER 10/05/2009   sees [name]  . Renal insufficiency    stage III  K[facility]     Past Surgical History:  Procedure Laterality Date  . ACROMIOPLASTY Right 05-06-14   per [name]   . BUNIONECTOMY     bilateral  . CATARACT EXTRACTION Left    per [name]   . COLONOSCOPY  07/10/2015   per [name], benign polyp, repeat in 10 yrs   . CORONARY STENT INTERVENTION N/A 01/17/2018   Procedure: CORONARY STENT INTERVENTION;  Surgeon: [name], MD;  Location: [place] [facility];   Service: Cardiovascular;  Laterality: N/A;  . CYSTECTOMY     benign from hand  . GLAUCOMA SURGERY Bilateral 2014   . heart stent    . INSERTION OF MESH N/A 01/10/2013   Procedure: INSERTION OF MESH;  Surgeon: Earnstine Regal, MD;  Location: [place];  Service: General;  Laterality: N/A;  . KNEE ARTHROSCOPY Right Jan. 2013   right knee, per [name]   . KNEE ARTHROSCOPY Left 01-14-15   per [name]   . LEFT HEART CATH AND CORONARY ANGIOGRAPHY N/A 01/17/2018   Procedure: LEFT HEART CATH AND CORONARY ANGIOGRAPHY;  Surgeon: [name], MD;  Location: [place] [facility];  Service: Cardiovascular;  Laterality: N/A;  . LEFT HEART CATH AND CORONARY ANGIOGRAPHY N/A 04/06/2018   Procedure: LEFT HEART CATH AND CORONARY ANGIOGRAPHY;  Surgeon: [name], MD;  Location: [place] [facility];  Service: Cardiovascular;  Laterality: N/A;  . MELANOMA EXCISION  2012   per [name]  . PROSTATECTOMY     per [name]  . TONSILLECTOMY    . TOTAL KNEE ARTHROPLASTY Right 07/22/2019   Procedure: TOTAL KNEE ARTHROPLASTY;  Surgeon: Netta Cedars, MD;  [facility];  Service: Orthopedics;  Laterality: Right;  .  TOTAL KNEE ARTHROPLASTY Left 07/10/2020   Procedure: TOTAL KNEE ARTHROPLASTY;  Surgeon: Netta Cedars, MD;  [facility];  Service: Orthopedics;  Laterality: Left;  . UMBILICAL HERNIA REPAIR  11/01/2011   Procedure: HERNIA REPAIR UMBILICAL ADULT;  Surgeon: Earnstine Regal, MD;  Location: [place];  Service: General;  Laterality: N/A;  Repair umbilical hernia with mesh patch  . VASECTOMY    . VENTRAL HERNIA REPAIR  01/10/2013   with mesh     [name]  . VENTRAL HERNIA REPAIR N/A 01/10/2013   Procedure: HERNIA REPAIR VENTRAL ADULT ;  Surgeon: Earnstine Regal, MD;  Location: [place];  Service: General;  Laterality: N/A;    There were no vitals filed for this visit.   Subjective Assessment - 08/05/20 0930    Subjective COVID-19 screen performed prior to patient entering clinic.  Reports that he has had more stiffness over the weekend from HEP and going up and down his stairs normally multiple times a day.    Pertinent History Right knee surgery, DJD, HTN, right shoulder.    Patient Stated Goals Get back to normal life.    Currently in Pain? No/denies              [facility] PT Assessment - 08/05/20 0001      Assessment   Medical Diagnosis Left total knee replacement.    Referring Provider (PT) Esmond Plants MD    Onset Date/Surgical Date 07/10/20    Hand Dominance Left    Next MD Visit 09/08/2020      Precautions   Precaution Comments No ultrasound.      Restrictions   Weight Bearing Restrictions No                         [facility] Adult PT Treatment/Exercise - 08/05/20 0001      Knee/Hip Exercises: Aerobic   Stationary Bike L3, seat 5 x10 min      Knee/Hip Exercises: Machines for Strengthening   Cybex Knee Extension 10# 3x10 reps    Cybex Knee Flexion 30# 3x10 reps      Knee/Hip Exercises: Standing   Forward Lunges Left;2 sets;10 reps;2 seconds    Forward Lunges Limitations 8" step    Step Down Left;15 reps;Hand Hold: 2;Step Height: 4"      Knee/Hip Exercises: Seated   Sit to Sand 15 reps;without UE support      Knee/Hip Exercises: Supine   Straight Leg Raises Strengthening;Left;2 sets;10 reps      Modalities   Modalities Vasopneumatic      Vasopneumatic   Number Minutes Vasopneumatic  10 minutes    Vasopnuematic Location  Knee    Vasopneumatic Pressure Medium    Vasopneumatic Temperature  34 for edema                       PT Long Term Goals - 07/13/20 1156      PT LONG TERM GOAL #1   Title Independent with a HEP.    Time 4    Period Weeks    Status New      PT LONG TERM GOAL #2   Title Pt will be able to amb with no device with step through gait pattern on community level surfaces.    Time 4    Period Weeks    Status New      PT LONG TERM GOAL #3   Title Pt will  improve his left knee flexion to 125  degrees in order to be able to improve functional mobility and gait.    Time 4    Period Weeks      PT LONG TERM GOAL #4   Title Pt will be able to reach active left knee extension to -5 degrees.    Time 4    Period Weeks    Status New      PT LONG TERM GOAL #5   Title Pt will be able to climb a flight of stairs with one rail with no pain reported.    Time 4    Period Weeks    Status New      PT LONG TERM GOAL #6   Title Perform ADL's with pain not > 3/10    Time 4    Period Weeks    Status New                 Plan - 08/05/20 0940    Clinical Impression Statement Patient presented in clinic with only reports of stiffness of LLE. Patient no longer using SPC for ambulation per MD recommendation. LLE TED hose donned. Patient progressed to more functiona quad training and stair training with step downs. Intermittant demonstration of L hip circumduction noted during treatment. Patient reporting limitations with sleeping as he goes between sofa and recliner in the night. Normal vasopneumatic response noted following removal of the modality.    Personal Factors and Comorbidities Comorbidity 1;Comorbidity 2;Other    Comorbidities Right knee surgery, DJD, HTN, right shoulder.    Examination-Activity Limitations Other;Locomotion Level    Examination-Participation Restrictions Other    Stability/Clinical Decision Making Stable/Uncomplicated    Rehab Potential Excellent    PT Frequency 3x / week    PT Duration 4 weeks    PT Treatment/Interventions ADLs/Self Care Home Management;Cryotherapy;Electrical Stimulation;Moist Heat;Gait training;Stair training;Functional mobility training;Therapeutic activities;Therapeutic exercise;Neuromuscular re-education;Manual techniques;Patient/family education;Passive range of motion;Vasopneumatic Device    PT Next Visit Plan Hip strength, gait on foam    Consulted and Agree with Plan of Care Patient           Patient will benefit from skilled  therapeutic intervention in order to improve the following deficits and impairments:  Abnormal gait,Pain,Decreased activity tolerance,Decreased strength,Increased edema,Decreased range of motion  Visit Diagnosis: Chronic pain of left knee  Stiffness of left knee, not elsewhere classified  Localized edema  Muscle weakness (generalized)     Problem List Patient Active Problem List   Diagnosis Date Noted  . H/O total knee replacement, left 07/10/2020  . Hyperglycemia 05/13/2020  . Status post total knee replacement, right 07/22/2019  . Chronic pain of right knee 05/29/2019  . Numbness and tingling 05/29/2019  . CKD (chronic kidney disease) stage 3, GFR 30-59 ml/min (HCC) 05/20/2019  . Paresthesia 01/17/2019  . Upper airway cough syndrome 09/19/2018  . DOE (dyspnea on exertion) 08/08/2018  . Abnormal cardiac CT angiography 01/17/2018  . Angina, class II ([place]) 01/17/2018  . Incisional hernia 08/11/2011  . PROSTATE CANCER 10/05/2009  . Coronary atherosclerosis 10/05/2009  . ABNORMAL EKG 10/07/2008  . Essential hypertension 04/13/2007  . DIVERTICULOSIS, COLON 04/13/2007  . Osteoarthritis 04/13/2007  . Hyperlipidemia with target LDL less than 70 09/26/2006    Standley Brooking, PTA 08/05/2020, 9:43 AM  [facility] [address] [place], [place], [address] Phone: [phone]   Fax:  [phone]  Name: [name] [name]. MRN: [id number] Date of Birth: 1946/02/20

## 2020-08-07 ENCOUNTER — Ambulatory Visit: Payer: Medicare Other | Admitting: Physical Therapy

## 2020-08-07 ENCOUNTER — Encounter: Payer: Self-pay | Admitting: Physical Therapy

## 2020-08-07 ENCOUNTER — Other Ambulatory Visit: Payer: Self-pay

## 2020-08-07 DIAGNOSIS — R600 Localized edema: Secondary | ICD-10-CM | POA: Diagnosis not present

## 2020-08-07 DIAGNOSIS — M25662 Stiffness of left knee, not elsewhere classified: Secondary | ICD-10-CM | POA: Diagnosis not present

## 2020-08-07 DIAGNOSIS — G8929 Other chronic pain: Secondary | ICD-10-CM | POA: Diagnosis not present

## 2020-08-07 DIAGNOSIS — M25562 Pain in left knee: Secondary | ICD-10-CM | POA: Diagnosis not present

## 2020-08-07 DIAGNOSIS — M6281 Muscle weakness (generalized): Secondary | ICD-10-CM | POA: Diagnosis not present

## 2020-08-07 NOTE — Therapy (Signed)
[facility] [place], [place], [address] Phone: [phone]   Fax:  [phone]  Physical Therapy Treatment  Patient Details  Name: [name] [name]. MRN: [id number] Date of Birth: [birth year] Referring Provider (PT): Esmond Plants MD   Encounter Date: 08/07/2020   PT End of Session - 08/07/20 0919    Visit Number 8    Number of Visits 12    Date for PT Re-Evaluation 08/10/20    Authorization Type FOTO AT LEAST EVERY 5TH VISIT.  PROGRESS NOTE AT 10TH VISIT.  KX MODIFIER AFTER 15 VISITS.    PT Start Time 0901    PT Stop Time 0944    PT Time Calculation (min) 43 min    Activity Tolerance Patient tolerated treatment well    Behavior During Therapy [facility] for tasks assessed/performed           Past Medical History:  Diagnosis Date  . ABNORMAL EKG 10/07/2008  . ADVEF, DRUG/MED/BIOL SUBST, OTHER DRUG NOS 09/26/2006  . Agent orange exposure   . Allergy    seasonal  . Cataract    left eye-surgery 07-2015  . DEGENERATIVE JOINT DISEASE, MODERATE 04/13/2007  . DIVERTICULOSIS, COLON 04/13/2007  . ELEVATED PROSTATE SPECIFIC ANTIGEN 04/14/2007  . GERD (gastroesophageal reflux disease)   . Glaucoma    sees [name]   . History of kidney stones    1970   . HYPERLIPIDEMIA 09/26/2006  . HYPERTENSION 04/13/2007  . JOINT EFFUSION, KNEE 06/15/2009  . Melanoma [facility])    sees [name]   . Numbness and tingling   . Pre-diabetes   . PROSTATE CANCER 10/05/2009   sees [name]  . Renal insufficiency    stage III [facility]     Past Surgical History:  Procedure Laterality Date  . ACROMIOPLASTY Right 05-06-14   per [name]   . BUNIONECTOMY     bilateral  . CATARACT EXTRACTION Left    per [name]   . COLONOSCOPY  07/10/2015   per [name], benign polyp, repeat in 10 yrs   . CORONARY STENT INTERVENTION N/A 01/17/2018   Procedure: CORONARY STENT INTERVENTION;  Surgeon: [name], MD;  Location: [place] [facility];   Service: Cardiovascular;  Laterality: N/A;  . CYSTECTOMY     benign from hand  . GLAUCOMA SURGERY Bilateral 2014   . heart stent    . INSERTION OF MESH N/A 01/10/2013   Procedure: INSERTION OF MESH;  Surgeon: Earnstine Regal, MD;  Location: [place];  Service: General;  Laterality: N/A;  . KNEE ARTHROSCOPY Right Jan. 2013   right knee, per [name]   . KNEE ARTHROSCOPY Left 01-14-15   per [name]   . LEFT HEART CATH AND CORONARY ANGIOGRAPHY N/A 01/17/2018   Procedure: LEFT HEART CATH AND CORONARY ANGIOGRAPHY;  Surgeon: [name], MD;  Location: [place] [facility];  Service: Cardiovascular;  Laterality: N/A;  . LEFT HEART CATH AND CORONARY ANGIOGRAPHY N/A 04/06/2018   Procedure: LEFT HEART CATH AND CORONARY ANGIOGRAPHY;  Surgeon: [name], MD;  Location: [place] [facility];  Service: Cardiovascular;  Laterality: N/A;  . MELANOMA EXCISION  2012   per [name]  . PROSTATECTOMY     per [name]  . TONSILLECTOMY    . TOTAL KNEE ARTHROPLASTY Right 07/22/2019   Procedure: TOTAL KNEE ARTHROPLASTY;  Surgeon: Netta Cedars, MD;  Location: [facility];  Service: Orthopedics;  Laterality: Right;  .  TOTAL KNEE ARTHROPLASTY Left 07/10/2020   Procedure: TOTAL KNEE ARTHROPLASTY;  Surgeon: Netta Cedars, MD;  Location: [facility];  Service: Orthopedics;  Laterality: Left;  . UMBILICAL HERNIA REPAIR  11/01/2011   Procedure: HERNIA REPAIR UMBILICAL ADULT;  Surgeon: Earnstine Regal, MD;  Location: [place];  Service: General;  Laterality: N/A;  Repair umbilical hernia with mesh patch  . VASECTOMY    . VENTRAL HERNIA REPAIR  01/10/2013   with mesh     [name]  . VENTRAL HERNIA REPAIR N/A 01/10/2013   Procedure: HERNIA REPAIR VENTRAL ADULT ;  Surgeon: Earnstine Regal, MD;  Location: [place];  Service: General;  Laterality: N/A;    There were no vitals filed for this visit.   Subjective Assessment - 08/07/20 0918    Subjective COVID-19 screen performed prior to patient entering clinic.  Reports more soreness. Sleeping some better and is mostly on the couch.    Pertinent History Right knee surgery, DJD, HTN, right shoulder.    Patient Stated Goals Get back to normal life.    Currently in Pain? Yes    Pain Score 3     Pain Location Knee    Pain Orientation Left    Pain Descriptors / Indicators Sore    Pain Type Surgical pain    Pain Onset 1 to 4 weeks ago    Pain Frequency Intermittent              [facility] PT Assessment - 08/07/20 0001      Assessment   Medical Diagnosis Left total knee replacement.    Referring Provider (PT) Esmond Plants MD    Onset Date/Surgical Date 07/10/20    Hand Dominance Left    Next MD Visit 09/08/2020      Precautions   Precaution Comments No ultrasound.      Restrictions   Weight Bearing Restrictions No      ROM / Strength   AROM / PROM / Strength AROM      AROM   Overall AROM  Within functional limits for tasks performed    AROM Assessment Site Knee    Right/Left Knee Left    Left Knee Extension 3    Left Knee Flexion 121                         [facility] Adult PT Treatment/Exercise - 08/07/20 0001      Knee/Hip Exercises: Aerobic   Stationary Bike L3, seat 5 x10 min      Knee/Hip Exercises: Machines for Strengthening   Cybex Knee Extension 10# 3x10 reps    Cybex Knee Flexion 30# 3x10 reps      Knee/Hip Exercises: Standing   Forward Lunges Left;2 sets;10 reps;2 seconds    Forward Lunges Limitations 6" step    Terminal Knee Extension Strengthening;Left;2 sets;10 reps;Theraband    Theraband Level (Terminal Knee Extension) Level 2 (Red)    Forward Step Up Left;2 sets;10 reps;Hand Hold: 2;Step Height: 6"    Step Down Left;15 reps;Hand Hold: 2;Step Height: 4"      Knee/Hip Exercises: Supine   Straight Leg Raises Strengthening;Left;15 reps      Modalities   Modalities Vasopneumatic      Vasopneumatic   Number Minutes Vasopneumatic  10 minutes    Vasopnuematic Location  Knee    Vasopneumatic Pressure  Medium    Vasopneumatic Temperature  55/soreness and edema  PT Long Term Goals - 08/07/20 3016      PT LONG TERM GOAL #1   Title Independent with a HEP.    Time 4    Period Weeks    Status On-going      PT LONG TERM GOAL #2   Title Pt will be able to amb with no device with step through gait pattern on community level surfaces.    Time 4    Period Weeks    Status Achieved      PT LONG TERM GOAL #3   Title Pt will improve his left knee flexion to 125 degrees in order to be able to improve functional mobility and gait.    Time 4    Period Weeks    Status On-going      PT LONG TERM GOAL #4   Title Pt will be able to reach active left knee extension to -5 degrees.    Time 4    Period Weeks    Status Achieved      PT LONG TERM GOAL #5   Title Pt will be able to climb a flight of stairs with one rail with no pain reported.    Time 4    Period Weeks    Status On-going      PT LONG TERM GOAL #6   Title Perform ADL's with pain not > 3/10    Time 4    Period Weeks    Status On-going                 Plan - 08/07/20 0937    Clinical Impression Statement Patient presented in clinic making great progress towards ROM goals as well as functional goals. Patient is no longer using AD for ambulation at this time. Patient able to tolerate progression of functional training with step ups with corrective cueing for technique as mod hip circumduction noted. LLE TED hose donned but per instruction patient states that hose to be discontinued on Monday. AROM of L knee measured as 3-121 deg today. Normal vasopneumatic response noted following removal of the modality.    Personal Factors and Comorbidities Comorbidity 1;Comorbidity 2;Other    Comorbidities Right knee surgery, DJD, HTN, right shoulder.    Examination-Activity Limitations Other;Locomotion Level    Examination-Participation Restrictions Other    Stability/Clinical Decision Making  Stable/Uncomplicated    Rehab Potential Excellent    PT Frequency 3x / week    PT Duration 4 weeks    PT Treatment/Interventions ADLs/Self Care Home Management;Cryotherapy;Electrical Stimulation;Moist Heat;Gait training;Stair training;Functional mobility training;Therapeutic activities;Therapeutic exercise;Neuromuscular re-education;Manual techniques;Patient/family education;Passive range of motion;Vasopneumatic Device    PT Next Visit Plan Hip strength, gait on foam    Consulted and Agree with Plan of Care Patient           Patient will benefit from skilled therapeutic intervention in order to improve the following deficits and impairments:  Abnormal gait,Pain,Decreased activity tolerance,Decreased strength,Increased edema,Decreased range of motion  Visit Diagnosis: Chronic pain of left knee  Stiffness of left knee, not elsewhere classified  Localized edema  Muscle weakness (generalized)     Problem List Patient Active Problem List   Diagnosis Date Noted  . H/O total knee replacement, left 07/10/2020  . Hyperglycemia 05/13/2020  . Status post total knee replacement, right 07/22/2019  . Chronic pain of right knee 05/29/2019  . Numbness and tingling 05/29/2019  . CKD (chronic kidney disease) stage 3, GFR 30-59 ml/min (HCC) 05/20/2019  . Paresthesia 01/17/2019  .  Upper airway cough syndrome 09/19/2018  . DOE (dyspnea on exertion) 08/08/2018  . Abnormal cardiac CT angiography 01/17/2018  . Angina, class II ([place]) 01/17/2018  . Incisional hernia 08/11/2011  . PROSTATE CANCER 10/05/2009  . Coronary atherosclerosis 10/05/2009  . ABNORMAL EKG 10/07/2008  . Essential hypertension 04/13/2007  . DIVERTICULOSIS, COLON 04/13/2007  . Osteoarthritis 04/13/2007  . Hyperlipidemia with target LDL less than 70 09/26/2006    Standley Brooking, PTA 08/07/2020, 9:45 AM  [facility] [address] [place], [place], [address] Phone: [phone]    Fax:  [phone]  Name: [name] [name]. MRN: [id number] Date of Birth: 02/21/1946

## 2020-08-11 DIAGNOSIS — Z9889 Other specified postprocedural states: Secondary | ICD-10-CM | POA: Diagnosis not present

## 2020-08-11 DIAGNOSIS — H43811 Vitreous degeneration, right eye: Secondary | ICD-10-CM | POA: Diagnosis not present

## 2020-08-11 DIAGNOSIS — H401131 Primary open-angle glaucoma, bilateral, mild stage: Secondary | ICD-10-CM | POA: Diagnosis not present

## 2020-08-11 DIAGNOSIS — Z961 Presence of intraocular lens: Secondary | ICD-10-CM | POA: Diagnosis not present

## 2020-08-12 ENCOUNTER — Other Ambulatory Visit: Payer: Self-pay

## 2020-08-12 ENCOUNTER — Ambulatory Visit: Payer: Medicare Other | Admitting: Physical Therapy

## 2020-08-12 DIAGNOSIS — M6281 Muscle weakness (generalized): Secondary | ICD-10-CM

## 2020-08-12 DIAGNOSIS — R600 Localized edema: Secondary | ICD-10-CM | POA: Diagnosis not present

## 2020-08-12 DIAGNOSIS — G8929 Other chronic pain: Secondary | ICD-10-CM | POA: Diagnosis not present

## 2020-08-12 DIAGNOSIS — M25562 Pain in left knee: Secondary | ICD-10-CM

## 2020-08-12 DIAGNOSIS — M25662 Stiffness of left knee, not elsewhere classified: Secondary | ICD-10-CM

## 2020-08-12 NOTE — Therapy (Signed)
[facility] [place], [place], [address] Phone: [phone]   Fax:  [phone]  Physical Therapy Treatment  Patient Details  Name: [name] [name]. MRN: [id number] Date of Birth: 1945-06-30 Referring Provider (PT): Esmond Plants MD   Encounter Date: 08/12/2020   PT End of Session - 08/12/20 0936    Visit Number 9    Number of Visits 12    Date for PT Re-Evaluation 08/10/20    Authorization Type FOTO AT LEAST EVERY 5TH VISIT.  PROGRESS NOTE AT 10TH VISIT.  KX MODIFIER AFTER 15 VISITS.    PT Start Time 0900    PT Stop Time 0941    PT Time Calculation (min) 41 min    Activity Tolerance Patient tolerated treatment well    Behavior During Therapy [facility] for tasks assessed/performed           Past Medical History:  Diagnosis Date  . ABNORMAL EKG 10/07/2008  . ADVEF, DRUG/MED/BIOL SUBST, OTHER DRUG NOS 09/26/2006  . Agent orange exposure   . Allergy    seasonal  . Cataract    left eye-surgery 07-2015  . DEGENERATIVE JOINT DISEASE, MODERATE 04/13/2007  . DIVERTICULOSIS, COLON 04/13/2007  . ELEVATED PROSTATE SPECIFIC ANTIGEN 04/14/2007  . GERD (gastroesophageal reflux disease)   . Glaucoma    sees [name]   . History of kidney stones    1970   . HYPERLIPIDEMIA 09/26/2006  . HYPERTENSION 04/13/2007  . JOINT EFFUSION, KNEE 06/15/2009  . Melanoma [facility])    sees [name]   . Numbness and tingling   . Pre-diabetes   . PROSTATE CANCER 10/05/2009   sees [name]  . Renal insufficiency    stage III [facility]     Past Surgical History:  Procedure Laterality Date  . ACROMIOPLASTY Right 05-06-14   per [name]   . BUNIONECTOMY     bilateral  . CATARACT EXTRACTION Left    per [name]   . COLONOSCOPY  07/10/2015   per [name], benign polyp, repeat in 10 yrs   . CORONARY STENT INTERVENTION N/A 01/17/2018   Procedure: CORONARY STENT INTERVENTION;  Surgeon: [name], MD;  Location: [place] [facility];   Service: Cardiovascular;  Laterality: N/A;  . CYSTECTOMY     benign from hand  . GLAUCOMA SURGERY Bilateral 2014   . heart stent    . INSERTION OF MESH N/A 01/10/2013   Procedure: INSERTION OF MESH;  Surgeon: Earnstine Regal, MD;  Location: [place];  Service: General;  Laterality: N/A;  . KNEE ARTHROSCOPY Right Jan. 2013   right knee, per [name]   . KNEE ARTHROSCOPY Left 01-14-15   per [name]   . LEFT HEART CATH AND CORONARY ANGIOGRAPHY N/A 01/17/2018   Procedure: LEFT HEART CATH AND CORONARY ANGIOGRAPHY;  Surgeon: [name], MD;  Location: [place] [facility];  Service: Cardiovascular;  Laterality: N/A;  . LEFT HEART CATH AND CORONARY ANGIOGRAPHY N/A 04/06/2018   Procedure: LEFT HEART CATH AND CORONARY ANGIOGRAPHY;  Surgeon: [name], MD;  Location: [place] [facility];  Service: Cardiovascular;  Laterality: N/A;  . MELANOMA EXCISION  2012   per [name]  . PROSTATECTOMY     per [name]  . TONSILLECTOMY    . TOTAL KNEE ARTHROPLASTY Right 07/22/2019   Procedure: TOTAL KNEE ARTHROPLASTY;  Surgeon: Netta Cedars, MD;  Location[facility];  Service: Orthopedics;  Laterality: Right;  .  TOTAL KNEE ARTHROPLASTY Left 07/10/2020   Procedure: TOTAL KNEE ARTHROPLASTY;  Surgeon: Netta Cedars, MD;  Location: [facility];  Service: Orthopedics;  Laterality: Left;  . UMBILICAL HERNIA REPAIR  11/01/2011   Procedure: HERNIA REPAIR UMBILICAL ADULT;  Surgeon: Earnstine Regal, MD;  Location: [place];  Service: General;  Laterality: N/A;  Repair umbilical hernia with mesh patch  . VASECTOMY    . VENTRAL HERNIA REPAIR  01/10/2013   with mesh     [name]  . VENTRAL HERNIA REPAIR N/A 01/10/2013   Procedure: HERNIA REPAIR VENTRAL ADULT ;  Surgeon: Earnstine Regal, MD;  Location: [place];  Service: General;  Laterality: N/A;    There were no vitals filed for this visit.   Subjective Assessment - 08/12/20 0910    Subjective COVID-19 screen performed prior to patient entering clinic.  Reports more soreness. Patient reported some minimal pain pending the activity.    Pertinent History Right knee surgery, DJD, HTN, right shoulder.    Patient Stated Goals Get back to normal life.    Currently in Pain? Yes    Pain Score 1     Pain Location Knee    Pain Orientation Left    Pain Descriptors / Indicators Sore    Pain Type Surgical pain    Pain Onset 1 to 4 weeks ago    Pain Frequency Intermittent    Aggravating Factors  certain activity    Pain Relieving Factors rest              [facility] PT Assessment - 08/12/20 0001      ROM / Strength   AROM / PROM / Strength AROM;PROM      AROM   AROM Assessment Site Knee    Right/Left Knee Left    Left Knee Extension 2    Left Knee Flexion 118      PROM   PROM Assessment Site Knee    Right/Left Knee Left    Left Knee Extension 0    Left Knee Flexion 120                         [facility] Adult PT Treatment/Exercise - 08/12/20 0001      Knee/Hip Exercises: Aerobic   Stationary Bike L3, seat 5 x10 min      Knee/Hip Exercises: Machines for Strengthening   Cybex Knee Extension 10# 3x10 reps    Cybex Knee Flexion 30# 3x10 reps    Cybex Leg Press 1 plt for calf raises 2x10      Knee/Hip Exercises: Standing   Forward Step Up Left;2 sets;10 reps;Hand Hold: 2;Step Height: 6"    Step Down Left;2 sets;10 reps;Step Height: 6"    Other Standing Knee Exercises side stepping with yellow band resistance bil directions 2 x fatigue      Vasopneumatic   Number Minutes Vasopneumatic  10 minutes    Vasopnuematic Location  Knee    Vasopneumatic Pressure Medium    Vasopneumatic Temperature  34 soreness and edema      Manual Therapy   Manual Therapy Passive ROM    Manual therapy comments measurements only                       PT Long Term Goals - 08/12/20 0915      PT LONG TERM GOAL #1   Title Independent with a HEP.    Baseline Doing several exercises  at home 08/12/20    Time 4    Period Weeks     Status Partially Met      PT LONG TERM GOAL #2   Title Pt will be able to amb with no device with step through gait pattern on community level surfaces.    Time 4    Period Weeks    Status Achieved      PT LONG TERM GOAL #3   Title Pt will improve his left knee flexion to 125 degrees in order to be able to improve functional mobility and gait.    Baseline AROM 118 degrees 08/12/20    Time 4    Period Weeks    Status On-going      PT LONG TERM GOAL #4   Title Pt will be able to reach active left knee extension to -5 degrees.    Time 4    Period Weeks    Status Achieved      PT LONG TERM GOAL #5   Title Pt will be able to climb a flight of stairs with one rail with no pain reported.    Time 4    Period Weeks    Status On-going      PT LONG TERM GOAL #6   Title Perform ADL's with pain not > 3/10    Baseline improved ADL's and little pain 08/12/20    Time 4    Period Weeks    Status Partially Met                 Plan - 08/12/20 0933    Clinical Impression Statement Patient tolerated treatment well today. Patient progressing with ROM  and PRE's for left LE. Patient doing multiple stretches and strengthening at home currently and disscussed HEP progression next few weeks. Patient doing well with ADL's and some pain with certain activity yet able to ease off with ice and rest. Pateitn current goals progressing.    Personal Factors and Comorbidities Comorbidity 1;Comorbidity 2;Other    Comorbidities Right knee surgery, DJD, HTN, right shoulder.    Examination-Activity Limitations Other;Locomotion Level    Examination-Participation Restrictions Other    Stability/Clinical Decision Making Stable/Uncomplicated    Rehab Potential Excellent    PT Frequency 3x / week    PT Duration 4 weeks    PT Treatment/Interventions ADLs/Self Care Home Management;Cryotherapy;Electrical Stimulation;Moist Heat;Gait training;Stair training;Functional mobility training;Therapeutic  activities;Therapeutic exercise;Neuromuscular re-education;Manual techniques;Patient/family education;Passive range of motion;Vasopneumatic Device    PT Next Visit Plan cont with Hip strength, gait on foam and progress HEP next week or two (FOTO and 10th visit next)    Consulted and Agree with Plan of Care Patient           Patient will benefit from skilled therapeutic intervention in order to improve the following deficits and impairments:  Abnormal gait,Pain,Decreased activity tolerance,Decreased strength,Increased edema,Decreased range of motion  Visit Diagnosis: Chronic pain of left knee  Stiffness of left knee, not elsewhere classified  Localized edema  Muscle weakness (generalized)     Problem List Patient Active Problem List   Diagnosis Date Noted  . H/O total knee replacement, left 07/10/2020  . Hyperglycemia 05/13/2020  . Status post total knee replacement, right 07/22/2019  . Chronic pain of right knee 05/29/2019  . Numbness and tingling 05/29/2019  . CKD (chronic kidney disease) stage 3, GFR 30-59 ml/min (HCC) 05/20/2019  . Paresthesia 01/17/2019  . Upper airway cough syndrome 09/19/2018  . DOE (dyspnea on exertion) 08/08/2018  .  Abnormal cardiac CT angiography 01/17/2018  . Angina, class II ([place]) 01/17/2018  . Incisional hernia 08/11/2011  . PROSTATE CANCER 10/05/2009  . Coronary atherosclerosis 10/05/2009  . ABNORMAL EKG 10/07/2008  . Essential hypertension 04/13/2007  . DIVERTICULOSIS, COLON 04/13/2007  . Osteoarthritis 04/13/2007  . Hyperlipidemia with target LDL less than 70 09/26/2006    [name] [name] P, PTA 08/12/2020, 9:43 AM  [facility] [place], [place], [address] Phone: [phone]   Fax:  [phone]  Name: [name] [name]. MRN: [id number] Date of Birth: [birth year]

## 2020-08-14 ENCOUNTER — Other Ambulatory Visit: Payer: Self-pay

## 2020-08-14 ENCOUNTER — Ambulatory Visit: Payer: Medicare Other | Admitting: *Deleted

## 2020-08-14 DIAGNOSIS — M6281 Muscle weakness (generalized): Secondary | ICD-10-CM

## 2020-08-14 DIAGNOSIS — R600 Localized edema: Secondary | ICD-10-CM | POA: Diagnosis not present

## 2020-08-14 DIAGNOSIS — G8929 Other chronic pain: Secondary | ICD-10-CM | POA: Diagnosis not present

## 2020-08-14 DIAGNOSIS — M25662 Stiffness of left knee, not elsewhere classified: Secondary | ICD-10-CM | POA: Diagnosis not present

## 2020-08-14 DIAGNOSIS — M25562 Pain in left knee: Secondary | ICD-10-CM | POA: Diagnosis not present

## 2020-08-14 NOTE — Therapy (Signed)
[facility] [place], [place], [address] Phone: [phone]   Fax:  [phone]  Physical Therapy Treatment  Patient Details  Name: [name] [name]. MRN: [id number] Date of Birth: [birth year] Referring Provider (PT): Esmond Plants MD   Encounter Date: 08/14/2020   PT End of Session - 08/14/20 0948     Visit Number 10    Number of Visits 12    Date for PT Re-Evaluation 08/10/20    Authorization Type FOTO AT LEAST EVERY 5TH VISIT.  PROGRESS NOTE AT 10TH VISIT.  KX MODIFIER AFTER 15 VISITS.    PT Start Time 0900    PT Stop Time 0958    PT Time Calculation (min) 58 min             Past Medical History:  Diagnosis Date   ABNORMAL EKG 10/07/2008   ADVEF, DRUG/MED/BIOL SUBST, OTHER DRUG NOS 09/26/2006   Agent orange exposure    Allergy    seasonal   Cataract    left eye-surgery 07-2015   DEGENERATIVE JOINT DISEASE, MODERATE 04/13/2007   DIVERTICULOSIS, COLON 04/13/2007   ELEVATED PROSTATE SPECIFIC ANTIGEN 04/14/2007   GERD (gastroesophageal reflux disease)    Glaucoma    sees [name]    History of kidney stones    1970    HYPERLIPIDEMIA 09/26/2006   HYPERTENSION 04/13/2007   JOINT EFFUSION, KNEE 06/15/2009   Melanoma ([place])    sees [name]    Numbness and tingling    Pre-diabetes    PROSTATE CANCER 10/05/2009   sees [name]   Renal insufficiency    stage III  Kidney     Past Surgical History:  Procedure Laterality Date   ACROMIOPLASTY Right 05-06-14   per Dr. Macy Mis     bilateral   CATARACT EXTRACTION Left    per [name]    COLONOSCOPY  07/10/2015   per [name], benign polyp, repeat in 10 yrs    CORONARY STENT INTERVENTION N/A 01/17/2018   Procedure: CORONARY STENT INTERVENTION;  Surgeon: [name], MD;  Location: [facility];  Service: Cardiovascular;  Laterality: N/A;   CYSTECTOMY     benign from hand   GLAUCOMA SURGERY Bilateral 2014    heart stent      INSERTION OF MESH N/A 01/10/2013   Procedure: INSERTION OF MESH;  Surgeon: Earnstine Regal, MD;  Location: [facility];  Service: General;  Laterality: N/A;   KNEE ARTHROSCOPY Right Jan. 2013   right knee, per [name]    KNEE ARTHROSCOPY Left 01-14-15   per [name]    LEFT HEART CATH AND CORONARY ANGIOGRAPHY N/A 01/17/2018   Procedure: LEFT HEART CATH AND CORONARY ANGIOGRAPHY;  Surgeon: [name], MD;  Location: [facility];  Service: Cardiovascular;  Laterality: N/A;   LEFT HEART CATH AND CORONARY ANGIOGRAPHY N/A 04/06/2018   Procedure: LEFT HEART CATH AND CORONARY ANGIOGRAPHY;  Surgeon: [name], MD;  Location: [facility];  Service: Cardiovascular;  Laterality: N/A;   MELANOMA EXCISION  2012   per [name]   PROSTATECTOMY     per [name]   TONSILLECTOMY     TOTAL KNEE ARTHROPLASTY Right 07/22/2019   Procedure: TOTAL KNEE ARTHROPLASTY;  Surgeon: Netta Cedars, MD;  Location: [facility];  Service: Orthopedics;  Laterality: Right;   TOTAL KNEE ARTHROPLASTY Left 07/10/2020   Procedure: TOTAL KNEE ARTHROPLASTY;  Surgeon: Netta Cedars,  MD;  Location: [facility];  Service: Orthopedics;  Laterality: Left;   UMBILICAL HERNIA REPAIR  11/01/2011   Procedure: HERNIA REPAIR UMBILICAL ADULT;  Surgeon: Earnstine Regal, MD;  Location: [facility];  Service: General;  Laterality: N/A;  Repair umbilical hernia with mesh patch   VASECTOMY     VENTRAL HERNIA REPAIR  01/10/2013   with mesh     [name] HERNIA REPAIR N/A 01/10/2013   Procedure: HERNIA REPAIR VENTRAL ADULT ;  Surgeon: Earnstine Regal, MD;  Location: [facility];  Service: General;  Laterality: N/A;    There were no vitals filed for this visit.   Subjective Assessment - 08/14/20 0917     Subjective COVID-19 screen performed prior to patient entering clinic. Reports more soreness/ stiffness today :LT knee    Pertinent History Right knee surgery, DJD, HTN, right shoulder.    Patient Stated Goals Get back to  normal life.    Currently in Pain? Yes    Pain Score 2     Pain Location Knee    Pain Orientation Left    Pain Descriptors / Indicators Sore;Tightness    Pain Type Surgical pain    Pain Onset 1 to 4 weeks ago                               [facility] Adult PT Treatment/Exercise - 08/14/20 0001       Knee/Hip Exercises: Aerobic   Stationary Bike L3, seat 5 x12 min      Knee/Hip Exercises: Standing   Heel Raises Both;1 set;20 reps    Heel Raises Limitations toe raises x20    Forward Lunges Left;10 reps;1 set   hold 10 secs   Forward Lunges Limitations 14in step    Lateral Step Up Left;2 sets;10 reps;Step Height: 4"    Forward Step Up Left;2 sets;10 reps;Hand Hold: 2;Step Height: 6"    Step Down Left;2 sets;10 reps;Step Height: 6"      Knee/Hip Exercises: Seated   Sit to Sand 15 reps;without UE support   Focus on hip/glutes to decrease knee stress     Modalities   Modalities Vasopneumatic      Vasopneumatic   Number Minutes Vasopneumatic  10 minutes    Vasopnuematic Location  Knee    Vasopneumatic Pressure Medium    Vasopneumatic Temperature  34 soreness and edema      Manual Therapy   Manual Therapy Passive ROM    Passive ROM patella mobs, scar mobs, and extension stretching LT knee                         PT Long Term Goals - 08/12/20 0915       PT LONG TERM GOAL #1   Title Independent with a HEP.    Baseline Doing several exercises at home 08/12/20    Time 4    Period Weeks    Status Partially Met      PT LONG TERM GOAL #2   Title Pt will be able to amb with no device with step through gait pattern on community level surfaces.    Time 4    Period Weeks    Status Achieved      PT LONG TERM GOAL #3   Title Pt will improve his left knee flexion to 125 degrees in order to be able to improve functional  mobility and gait.    Baseline AROM 118 degrees 08/12/20    Time 4    Period Weeks    Status On-going      PT LONG TERM GOAL #4    Title Pt will be able to reach active left knee extension to -5 degrees.    Time 4    Period Weeks    Status Achieved      PT LONG TERM GOAL #5   Title Pt will be able to climb a flight of stairs with one rail with no pain reported.    Time 4    Period Weeks    Status On-going      PT LONG TERM GOAL #6   Title Perform ADL's with pain not > 3/10    Baseline improved ADL's and little pain 08/12/20    Time 4    Period Weeks    Status Partially Met                   Plan - 08/14/20 0949     Clinical Impression Statement Pt arrived today doing fairly well with LT knee. Rx focused on ROM as well as functional strengthening focusing on quad control.  Manual patella mobs, scar mobs and passive extension f/b Vaso for edema control with normal response    Personal Factors and Comorbidities Comorbidity 1;Comorbidity 2;Other    Comorbidities Right knee surgery, DJD, HTN, right shoulder.    Examination-Activity Limitations Other;Locomotion Level    Examination-Participation Restrictions Other    Stability/Clinical Decision Making Stable/Uncomplicated             Patient will benefit from skilled therapeutic intervention in order to improve the following deficits and impairments:  Abnormal gait, Pain, Decreased activity tolerance, Decreased strength, Increased edema, Decreased range of motion  Visit Diagnosis: Chronic pain of left knee  Stiffness of left knee, not elsewhere classified  Localized edema  Muscle weakness (generalized)     Problem List Patient Active Problem List   Diagnosis Date Noted   H/O total knee replacement, left 07/10/2020   Hyperglycemia 05/13/2020   Status post total knee replacement, right 07/22/2019   Chronic pain of right knee 05/29/2019   Numbness and tingling 05/29/2019   CKD (chronic kidney disease) stage 3, GFR 30-59 ml/min ([place]) 05/20/2019   Paresthesia 01/17/2019   Upper airway cough syndrome 09/19/2018   DOE (dyspnea on exertion)  08/08/2018   Abnormal cardiac CT angiography 01/17/2018   Angina, class II ([place]) 01/17/2018   Incisional hernia 08/11/2011   PROSTATE CANCER 10/05/2009   Coronary atherosclerosis 10/05/2009   ABNORMAL EKG 10/07/2008   Essential hypertension 04/13/2007   DIVERTICULOSIS, COLON 04/13/2007   Osteoarthritis 04/13/2007   Hyperlipidemia with target LDL less than 70 09/26/2006    Guerline Happ,CHRIS, PTA 08/14/2020, 10:07 AM  [facility] [address] [place], [place], [address] Phone: [phone]   Fax:  [phone]  Name: Darvell Monteforte. MRN: [id number] Date of Birth: September 22, 1945

## 2020-08-17 ENCOUNTER — Ambulatory Visit: Payer: Medicare Other | Admitting: Physical Therapy

## 2020-08-17 ENCOUNTER — Other Ambulatory Visit: Payer: Self-pay

## 2020-08-17 ENCOUNTER — Encounter: Payer: Self-pay | Admitting: Physical Therapy

## 2020-08-17 DIAGNOSIS — M6281 Muscle weakness (generalized): Secondary | ICD-10-CM | POA: Diagnosis not present

## 2020-08-17 DIAGNOSIS — M25562 Pain in left knee: Secondary | ICD-10-CM | POA: Diagnosis not present

## 2020-08-17 DIAGNOSIS — G8929 Other chronic pain: Secondary | ICD-10-CM | POA: Diagnosis not present

## 2020-08-17 DIAGNOSIS — M25662 Stiffness of left knee, not elsewhere classified: Secondary | ICD-10-CM

## 2020-08-17 DIAGNOSIS — R600 Localized edema: Secondary | ICD-10-CM | POA: Diagnosis not present

## 2020-08-17 NOTE — Therapy (Signed)
[facility] [place], [place], [address] Phone: [phone]   Fax:  [phone]  Physical Therapy Treatment  Patient Details  Name: [name] [name]. MRN: [id number] Date of Birth: [birth year] Referring Provider (PT): Esmond Plants MD   Encounter Date: 08/17/2020   PT End of Session - 08/17/20 0933     Visit Number 11    Authorization Type FOTO AT LEAST EVERY 5TH VISIT.  PROGRESS NOTE AT 10TH VISIT.  KX MODIFIER AFTER 15 VISITS.    PT Start Time 0815    PT Stop Time 0900    PT Time Calculation (min) 45 min    Activity Tolerance Patient tolerated treatment well    Behavior During Therapy [facility] for tasks assessed/performed             Past Medical History:  Diagnosis Date   ABNORMAL EKG 10/07/2008   ADVEF, DRUG/MED/BIOL SUBST, OTHER DRUG NOS 09/26/2006   Agent orange exposure    Allergy    seasonal   Cataract    left eye-surgery 07-2015   DEGENERATIVE JOINT DISEASE, MODERATE 04/13/2007   DIVERTICULOSIS, COLON 04/13/2007   ELEVATED PROSTATE SPECIFIC ANTIGEN 04/14/2007   GERD (gastroesophageal reflux disease)    Glaucoma    sees [name]    History of kidney stones    1970    HYPERLIPIDEMIA 09/26/2006   HYPERTENSION 04/13/2007   JOINT EFFUSION, KNEE 06/15/2009   Melanoma ([place])    sees [name]    Numbness and tingling    Pre-diabetes    PROSTATE CANCER 10/05/2009   sees [name]   Renal insufficiency    stage III [facility]     Past Surgical History:  Procedure Laterality Date   ACROMIOPLASTY Right 05-06-14   per Dr. Macy Mis     bilateral   CATARACT EXTRACTION Left    per [name]    COLONOSCOPY  07/10/2015   per [name], benign polyp, repeat in 10 yrs    CORONARY STENT INTERVENTION N/A 01/17/2018   Procedure: CORONARY STENT INTERVENTION;  Surgeon: [name], MD;  Location: [facility];  Service: Cardiovascular;  Laterality: N/A;   CYSTECTOMY     benign from hand    GLAUCOMA SURGERY Bilateral 2014    heart stent     INSERTION OF MESH N/A 01/10/2013   Procedure: INSERTION OF MESH;  Surgeon: Earnstine Regal, MD;  Location: [facility];  Service: General;  Laterality: N/A;   KNEE ARTHROSCOPY Right Jan. 2013   right knee, per [name]    KNEE ARTHROSCOPY Left 01-14-15   per [name]    LEFT HEART CATH AND CORONARY ANGIOGRAPHY N/A 01/17/2018   Procedure: LEFT HEART CATH AND CORONARY ANGIOGRAPHY;  Surgeon: [name], MD;  Location: [facility];  Service: Cardiovascular;  Laterality: N/A;   LEFT HEART CATH AND CORONARY ANGIOGRAPHY N/A 04/06/2018   Procedure: LEFT HEART CATH AND CORONARY ANGIOGRAPHY;  Surgeon: [name], MD;  Location: [facility];  Service: Cardiovascular;  Laterality: N/A;   MELANOMA EXCISION  2012   per [name]   PROSTATECTOMY     per [name]   TONSILLECTOMY     TOTAL KNEE ARTHROPLASTY Right 07/22/2019   Procedure: TOTAL KNEE ARTHROPLASTY;  Surgeon: Netta Cedars, MD;  Location: [facility];  Service: Orthopedics;  Laterality: Right;   TOTAL KNEE ARTHROPLASTY Left 07/10/2020   Procedure: TOTAL KNEE ARTHROPLASTY;  Surgeon: Netta Cedars, MD;  [facility]: [facility];  Service: Orthopedics;  Laterality: Left;   UMBILICAL HERNIA REPAIR  11/01/2011   Procedure: HERNIA REPAIR UMBILICAL ADULT;  Surgeon: Earnstine Regal, MD;  Location: [facility];  Service: General;  Laterality: N/A;  Repair umbilical hernia with mesh patch   VASECTOMY     VENTRAL HERNIA REPAIR  01/10/2013   with mesh     [name] HERNIA REPAIR N/A 01/10/2013   Procedure: HERNIA REPAIR VENTRAL ADULT ;  Surgeon: Earnstine Regal, MD;  Location: [facility];  Service: General;  Laterality: N/A;    There were no vitals filed for this visit.   Subjective Assessment - 08/17/20 0850     Subjective COVID-19 screen performed prior to patient entering clinic.  Patient very active at home and knee will swell.  Recommended ice, elevation and can use TED hose  as needed.    Pertinent History Right knee surgery, DJD, HTN, right shoulder.    Patient Stated Goals Get back to normal life.    Currently in Pain? Yes    Pain Score 2     Pain Location Knee    Pain Orientation Left    Pain Descriptors / Indicators Sore    Pain Type Surgical pain    Pain Onset 1 to 4 weeks ago                               [facility] Adult PT Treatment/Exercise - 08/17/20 0001       Exercises   Exercises Knee/Hip      Knee/Hip Exercises: Aerobic   Recumbent Bike Level 3 progressing to seat 1 over 15 minutes.      Knee/Hip Exercises: Machines for Strengthening   Cybex Knee Extension 10# x 3 minutes.    Cybex Knee Flexion 30# x 3 minutes.    Cybex Leg Press 3 plates x 3 minutes.      Vasopneumatic   Number Minutes Vasopneumatic  15 minutes    Vasopnuematic Location  --   Left knee.   Vasopneumatic Pressure Medium                         PT Long Term Goals - 08/12/20 0915       PT LONG TERM GOAL #1   Title Independent with a HEP.    Baseline Doing several exercises at home 08/12/20    Time 4    Period Weeks    Status Partially Met      PT LONG TERM GOAL #2   Title Pt will be able to amb with no device with step through gait pattern on community level surfaces.    Time 4    Period Weeks    Status Achieved      PT LONG TERM GOAL #3   Title Pt will improve his left knee flexion to 125 degrees in order to be able to improve functional mobility and gait.    Baseline AROM 118 degrees 08/12/20    Time 4    Period Weeks    Status On-going      PT LONG TERM GOAL #4   Title Pt will be able to reach active left knee extension to -5 degrees.    Time 4    Period Weeks    Status Achieved      PT LONG TERM GOAL #  5   Title Pt will be able to climb a flight of stairs with one rail with no pain reported.    Time 4    Period Weeks    Status On-going      PT LONG TERM GOAL #6   Title Perform ADL's with pain not > 3/10     Baseline improved ADL's and little pain 08/12/20    Time 4    Period Weeks    Status Partially Met                   Plan - 08/17/20 0933     Clinical Impression Statement The patient is making excellent progress.  He achieved seat 1 on recumbent bike today and performed leg press with excellent technique and no complaint.    Personal Factors and Comorbidities Comorbidity 1;Comorbidity 2;Other    Comorbidities Right knee surgery, DJD, HTN, right shoulder.    Examination-Participation Restrictions Other    Stability/Clinical Decision Making Stable/Uncomplicated    Rehab Potential Excellent    PT Frequency 3x / week    PT Duration 4 weeks    PT Treatment/Interventions ADLs/Self Care Home Management;Cryotherapy;Electrical Stimulation;Moist Heat;Gait training;Stair training;Functional mobility training;Therapeutic activities;Therapeutic exercise;Neuromuscular re-education;Manual techniques;Patient/family education;Passive range of motion;Vasopneumatic Device    Consulted and Agree with Plan of Care Patient             Patient will benefit from skilled therapeutic intervention in order to improve the following deficits and impairments:  Abnormal gait, Pain, Decreased activity tolerance, Decreased strength, Increased edema, Decreased range of motion  Visit Diagnosis: Chronic pain of left knee  Stiffness of left knee, not elsewhere classified  Localized edema     Problem List Patient Active Problem List   Diagnosis Date Noted   H/O total knee replacement, left 07/10/2020   Hyperglycemia 05/13/2020   Status post total knee replacement, right 07/22/2019   Chronic pain of right knee 05/29/2019   Numbness and tingling 05/29/2019   CKD (chronic kidney disease) stage 3, GFR 30-59 ml/min ([place]) 05/20/2019   Paresthesia 01/17/2019   Upper airway cough syndrome 09/19/2018   DOE (dyspnea on exertion) 08/08/2018   Abnormal cardiac CT angiography 01/17/2018   Angina, class II  ([place]) 01/17/2018   Incisional hernia 08/11/2011   PROSTATE CANCER 10/05/2009   Coronary atherosclerosis 10/05/2009   ABNORMAL EKG 10/07/2008   Essential hypertension 04/13/2007   DIVERTICULOSIS, COLON 04/13/2007   Osteoarthritis 04/13/2007   Hyperlipidemia with target LDL less than 70 09/26/2006    Merrillyn Ackerley, [place] MPT 08/17/2020, 9:36 AM  [facility] [address] [place], [place], [address] Phone: [phone]   Fax:  [phone]  Name: [name] [name]. MRN: [id number] Date of Birth: [birth year]

## 2020-08-20 ENCOUNTER — Ambulatory Visit: Payer: Medicare Other | Admitting: Physical Therapy

## 2020-08-20 ENCOUNTER — Other Ambulatory Visit: Payer: Self-pay

## 2020-08-20 DIAGNOSIS — M25562 Pain in left knee: Secondary | ICD-10-CM

## 2020-08-20 DIAGNOSIS — M6281 Muscle weakness (generalized): Secondary | ICD-10-CM | POA: Diagnosis not present

## 2020-08-20 DIAGNOSIS — G8929 Other chronic pain: Secondary | ICD-10-CM | POA: Diagnosis not present

## 2020-08-20 DIAGNOSIS — M25662 Stiffness of left knee, not elsewhere classified: Secondary | ICD-10-CM | POA: Diagnosis not present

## 2020-08-20 DIAGNOSIS — R600 Localized edema: Secondary | ICD-10-CM | POA: Diagnosis not present

## 2020-08-20 NOTE — Therapy (Addendum)
[facility] [place], [place], [address] Phone: [phone]   Fax:  [phone]  Physical Therapy Treatment  Patient Details  Name: [name] [name]. MRN: [id number] Date of Birth: 06/10/1945 Referring Provider (PT): Esmond Plants MD   Encounter Date: 08/20/2020   PT End of Session - 08/20/20 1100     Visit Number 12    Number of Visits 12    PT Start Time 0903    PT Stop Time 0957    PT Time Calculation (min) 54 min    Activity Tolerance Patient tolerated treatment well    Behavior During Therapy [facility] for tasks assessed/performed             Past Medical History:  Diagnosis Date   ABNORMAL EKG 10/07/2008   ADVEF, DRUG/MED/BIOL SUBST, OTHER DRUG NOS 09/26/2006   Agent orange exposure    Allergy    seasonal   Cataract    left eye-surgery 07-2015   DEGENERATIVE JOINT DISEASE, MODERATE 04/13/2007   DIVERTICULOSIS, COLON 04/13/2007   ELEVATED PROSTATE SPECIFIC ANTIGEN 04/14/2007   GERD (gastroesophageal reflux disease)    Glaucoma    sees [name]    History of kidney stones    1970    HYPERLIPIDEMIA 09/26/2006   HYPERTENSION 04/13/2007   JOINT EFFUSION, KNEE 06/15/2009   Melanoma ([place])    sees [name]    Numbness and tingling    Pre-diabetes    PROSTATE CANCER 10/05/2009   sees [name]   Renal insufficiency    stage III  Kidney     Past Surgical History:  Procedure Laterality Date   ACROMIOPLASTY Right 05-06-14   per Dr. Macy Mis     bilateral   CATARACT EXTRACTION Left    per [name]    COLONOSCOPY  07/10/2015   per [name], benign polyp, repeat in 10 yrs    CORONARY STENT INTERVENTION N/A 01/17/2018   Procedure: CORONARY STENT INTERVENTION;  Surgeon: [name], MD;  Location: [place] [facility];  Service: Cardiovascular;  Laterality: N/A;   CYSTECTOMY     benign from hand   GLAUCOMA SURGERY Bilateral 2014    heart stent     INSERTION OF MESH N/A 01/10/2013    Procedure: INSERTION OF MESH;  Surgeon: Earnstine Regal, MD;  Location: [place];  Service: General;  Laterality: N/A;   KNEE ARTHROSCOPY Right Jan. 2013   right knee, per [name]    KNEE ARTHROSCOPY Left 01-14-15   per [name]    LEFT HEART CATH AND CORONARY ANGIOGRAPHY N/A 01/17/2018   Procedure: LEFT HEART CATH AND CORONARY ANGIOGRAPHY;  Surgeon: [name], MD;  Location: [place] [facility];  Service: Cardiovascular;  Laterality: N/A;   LEFT HEART CATH AND CORONARY ANGIOGRAPHY N/A 04/06/2018   Procedure: LEFT HEART CATH AND CORONARY ANGIOGRAPHY;  Surgeon: [name], MD;  Location: [place] [facility];  Service: Cardiovascular;  Laterality: N/A;   MELANOMA EXCISION  2012   per [name]   PROSTATECTOMY     per [name]   TONSILLECTOMY     TOTAL KNEE ARTHROPLASTY Right 07/22/2019   Procedure: TOTAL KNEE ARTHROPLASTY;  Surgeon: Netta Cedars, MD;  Location: [facility];  Service: Orthopedics;  Laterality: Right;   TOTAL KNEE ARTHROPLASTY Left 07/10/2020   Procedure: TOTAL KNEE ARTHROPLASTY;  Surgeon: Netta Cedars, MD;  Location: [facility];  Service: Orthopedics;  Laterality: Left;  UMBILICAL HERNIA REPAIR  11/01/2011   Procedure: HERNIA REPAIR UMBILICAL ADULT;  Surgeon: Earnstine Regal, MD;  Location: [place];  Service: General;  Laterality: N/A;  Repair umbilical hernia with mesh patch   VASECTOMY     VENTRAL HERNIA REPAIR  01/10/2013   with mesh     [name] HERNIA REPAIR N/A 01/10/2013   Procedure: HERNIA REPAIR VENTRAL ADULT ;  Surgeon: Earnstine Regal, MD;  Location: [place];  Service: General;  Laterality: N/A;    There were no vitals filed for this visit.   Subjective Assessment - 08/20/20 1100     Subjective COVID-19 screen performed prior to patient entering clinic.  Doing great.    Pertinent History Right knee surgery, DJD, HTN, right shoulder.    Patient Stated Goals Get back to normal life.    Currently in Pain? Yes    Pain Score 1     Pain  Location Knee    Pain Orientation Left    Pain Onset 1 to 4 weeks ago                [facility] PT Assessment - 08/20/20 0001       AROM   Left Knee Flexion 120      PROM   Left Knee Flexion 125                           [facility] Adult PT Treatment/Exercise - 08/20/20 0001       Exercises   Exercises Knee/Hip      Knee/Hip Exercises: Aerobic   Recumbent Bike Level 3 x 16 minutes progressing to seat 1.      Knee/Hip Exercises: Machines for Strengthening   Cybex Knee Extension 10# x 3 minutes.    Cybex Knee Flexion 40# x 3 minutes.    Cybex Leg Press 3 plates x 3 minutes.      Modalities   Modalities Vasopneumatic      Vasopneumatic   Number Minutes Vasopneumatic  15 minutes    Vasopnuematic Location  --   Left knee.   Vasopneumatic Pressure Medium                         PT Long Term Goals - 08/12/20 0915       PT LONG TERM GOAL #1   Title Independent with a HEP.    Baseline Doing several exercises at home 08/12/20    Time 4    Period Weeks    Status Partially Met      PT LONG TERM GOAL #2   Title Pt will be able to amb with no device with step through gait pattern on community level surfaces.    Time 4    Period Weeks    Status Achieved      PT LONG TERM GOAL #3   Title Pt will improve his left knee flexion to 125 degrees in order to be able to improve functional mobility and gait.    Baseline AROM 118 degrees 08/12/20    Time 4    Period Weeks    Status On-going      PT LONG TERM GOAL #4   Title Pt will be able to reach active left knee extension to -5 degrees.    Time 4    Period Weeks    Status Achieved      PT  LONG TERM GOAL #5   Title Pt will be able to climb a flight of stairs with one rail with no pain reported.    Time 4    Period Weeks    Status On-going      PT LONG TERM GOAL #6   Title Perform ADL's with pain not > 3/10    Baseline improved ADL's and little pain 08/12/20    Time 4    Period Weeks     Status Partially Met                   Plan - 08/20/20 1102     Clinical Impression Statement The patient is making excellent progress with active left knee flexion to 120 degrees and passive to 125 degrees.    Personal Factors and Comorbidities Comorbidity 1;Comorbidity 2;Other    Comorbidities Right knee surgery, DJD, HTN, right shoulder.    Examination-Activity Limitations Other;Locomotion Level    Examination-Participation Restrictions Other    Stability/Clinical Decision Making Stable/Uncomplicated    Rehab Potential Excellent    PT Frequency 3x / week    PT Duration 4 weeks    PT Treatment/Interventions ADLs/Self Care Home Management;Cryotherapy;Electrical Stimulation;Moist Heat;Gait training;Stair training;Functional mobility training;Therapeutic activities;Therapeutic exercise;Neuromuscular re-education;Manual techniques;Patient/family education;Passive range of motion;Vasopneumatic Device             Patient will benefit from skilled therapeutic intervention in order to improve the following deficits and impairments:  Abnormal gait, Pain, Decreased activity tolerance, Decreased strength, Increased edema, Decreased range of motion  Visit Diagnosis: Chronic pain of left knee     Problem List Patient Active Problem List   Diagnosis Date Noted   H/O total knee replacement, left 07/10/2020   Hyperglycemia 05/13/2020   Status post total knee replacement, right 07/22/2019   Chronic pain of right knee 05/29/2019   Numbness and tingling 05/29/2019   CKD (chronic kidney disease) stage 3, GFR 30-59 ml/min (HCC) 05/20/2019   Paresthesia 01/17/2019   Upper airway cough syndrome 09/19/2018   DOE (dyspnea on exertion) 08/08/2018   Abnormal cardiac CT angiography 01/17/2018   Angina, class II ([place]) 01/17/2018   Incisional hernia 08/11/2011   PROSTATE CANCER 10/05/2009   Coronary atherosclerosis 10/05/2009   ABNORMAL EKG 10/07/2008   Essential hypertension 04/13/2007    DIVERTICULOSIS, COLON 04/13/2007   Osteoarthritis 04/13/2007   Hyperlipidemia with target LDL less than 70 09/26/2006   PHYSICAL THERAPY DISCHARGE SUMMARY  Visits from Start of Care: 12.  Current functional level related to goals / functional outcomes: See above.   Remaining deficits: See goal section.   Education / Equipment: HEP.   Patient agrees to discharge. Patient goals were partially met. Patient is being discharged due to being pleased with the current functional level.  Carter Kassel, [place] MPT 08/20/2020, 11:04 AM  [facility] [address] [place], [place], [address] Phone: [phone]   Fax:  [phone]  Name: [name] [name]. MRN: [id number] Date of Birth: Jun 01, 1945

## 2020-09-22 DIAGNOSIS — Z23 Encounter for immunization: Secondary | ICD-10-CM | POA: Diagnosis not present

## 2020-09-28 DIAGNOSIS — N1832 Chronic kidney disease, stage 3b: Secondary | ICD-10-CM | POA: Diagnosis not present

## 2020-10-05 DIAGNOSIS — N281 Cyst of kidney, acquired: Secondary | ICD-10-CM | POA: Diagnosis not present

## 2020-10-05 DIAGNOSIS — E785 Hyperlipidemia, unspecified: Secondary | ICD-10-CM | POA: Diagnosis not present

## 2020-10-05 DIAGNOSIS — M1710 Unilateral primary osteoarthritis, unspecified knee: Secondary | ICD-10-CM | POA: Diagnosis not present

## 2020-10-05 DIAGNOSIS — C61 Malignant neoplasm of prostate: Secondary | ICD-10-CM | POA: Diagnosis not present

## 2020-10-05 DIAGNOSIS — N1832 Chronic kidney disease, stage 3b: Secondary | ICD-10-CM | POA: Diagnosis not present

## 2020-10-05 DIAGNOSIS — I129 Hypertensive chronic kidney disease with stage 1 through stage 4 chronic kidney disease, or unspecified chronic kidney disease: Secondary | ICD-10-CM | POA: Diagnosis not present

## 2020-10-05 DIAGNOSIS — I2510 Atherosclerotic heart disease of native coronary artery without angina pectoris: Secondary | ICD-10-CM | POA: Diagnosis not present

## 2020-10-19 ENCOUNTER — Telehealth: Payer: Self-pay | Admitting: Pharmacist

## 2020-10-19 NOTE — Chronic Care Management (AMB) (Signed)
Chronic Care Management Pharmacy Assistant   Name: [name] [name].  MRN: [id number] DOB: March 04, 1946  Reason for Encounter: General Assessment Call   Recent office visits:  05-13-2020 [name], MD - Patient presented for Ganglion cyst and other concerns. No medication changes.    Recent consult visits:  04-08-2020 Josue Hector, MD - Patient presented for Coronary artery disease involving native coronary artery of native heart without angina pectoris and hypertension. Prescribed Amlodipine Besylate 10 mg.    Hospital visits:  Medication Reconciliation was completed by comparing discharge summary, patient's EMR and Pharmacy list, and upon discussion with patient.  Admitted to [facility] on 07-10-2020  due to total knee replacement left. He was there for 26 hours.  New?Medications Started at [facility] Discharge:?? -started  None  Medication Changes at Hospital Discharge: -Changed  None  Medications Discontinued at Hospital Discharge: -Stopped  None  Medications that remain the same after Hospital Discharge:??  -All other medications will remain the same.    Medications: Outpatient Encounter Medications as of 10/19/2020  Medication Sig   amLODipine (NORVASC) 10 MG tablet Take 1 tablet (10 mg total) by mouth daily.   bimatoprost (LUMIGAN) 0.01 % SOLN Place 1 drop into both eyes at bedtime.   clopidogrel (PLAVIX) 75 MG tablet Take 1 tablet (75 mg total) by mouth daily with breakfast. (Patient taking differently: Take 75 mg by mouth daily.)   dorzolamide-timolol (COSOPT) 22.3-6.8 MG/ML ophthalmic solution Place 1 drop into both eyes 2 (two) times daily.    Ergocalciferol (VITAMIN D2) 50 MCG (2000 UT) TABS Take 2,000 Units by mouth daily.   famotidine (PEPCID) 20 MG tablet Take 20 mg by mouth at bedtime.    fenofibrate 160 MG tablet Take 160 mg by mouth at bedtime.    fluticasone (FLONASE) 50 MCG/ACT nasal spray Place 1 spray into both nostrils daily as  needed for allergies.   isosorbide mononitrate (IMDUR) 30 MG 24 hr tablet Take 30 mg by mouth daily.   magnesium oxide (MAG-OX) 400 MG tablet Take 400 mg by mouth every evening.    methocarbamol (ROBAXIN) 500 MG tablet Take 1 tablet (500 mg total) by mouth every 8 (eight) hours as needed for muscle spasms.   nitroGLYCERIN (NITROSTAT) 0.4 MG SL tablet Place 0.4 mg under the tongue every 5 (five) minutes as needed for chest pain.   ondansetron (ZOFRAN) 4 MG tablet Take 1 tablet (4 mg total) by mouth daily as needed for nausea or vomiting.   oxyCODONE-acetaminophen (PERCOCET) 5-325 MG tablet Take 1 tablet by mouth every 4 (four) hours as needed for severe pain. (Patient not taking: Reported on 07/31/2020)   pantoprazole (PROTONIX) 40 MG tablet Take 1 tablet (40 mg total) by mouth daily.   rosuvastatin (CRESTOR) 20 MG tablet Take 20 mg by mouth every evening.    telmisartan (MICARDIS) 80 MG tablet Take 1 tablet (80 mg total) by mouth daily.   No facility-administered encounter medications on file as of 10/19/2020.  Notes: Call to patient he reports he has been doing well. He is currently recovering from knee replacement and now getting back into the groove of his physical activity. He swims 3 times a week and does all the yard work at home. He notes he is recently returning home from a short vacation and hasn't started back checking his blood pressures but prior to vacation was 116/60. His appetite has been good he does try his best to watch his sodium intake  with meals. He drinks plenty of water. He notes he is not in need of any of his medications at this time and has no other concerns or issues to report. Offered him a follow up appointment with the Clinical Pharmacist on 01-08-2021 via phone call and he accepted.   Care Gaps: COVID Booster #3 Levan Hurst) - Overdue Eye Exam - Overdue Flu Vaccine - Overdue CCM F/U Call Scheduled - 01-08-2021 10 am AWV - MSG sent to Ramond Craver CMA to schedule.  Star  Rating Drugs: Telmisartan (Micardis) 80 mg - Last filled at [facility] mail order [facility]) Rosuvastatin (Crestor) 20 mg - Last filled at [facility] mail order  [place] Pharmacist Assistant [phone]

## 2020-11-16 ENCOUNTER — Encounter: Payer: Self-pay | Admitting: Family Medicine

## 2020-11-16 ENCOUNTER — Ambulatory Visit (INDEPENDENT_AMBULATORY_CARE_PROVIDER_SITE_OTHER): Payer: Medicare Other | Admitting: Family Medicine

## 2020-11-16 ENCOUNTER — Other Ambulatory Visit: Payer: Self-pay

## 2020-11-16 VITALS — BP 120/78 | HR 56 | Temp 98.5°F | Ht 66.0 in | Wt 203.0 lb

## 2020-11-16 DIAGNOSIS — I2510 Atherosclerotic heart disease of native coronary artery without angina pectoris: Secondary | ICD-10-CM

## 2020-11-16 DIAGNOSIS — E785 Hyperlipidemia, unspecified: Secondary | ICD-10-CM

## 2020-11-16 DIAGNOSIS — Z23 Encounter for immunization: Secondary | ICD-10-CM

## 2020-11-16 DIAGNOSIS — N1831 Chronic kidney disease, stage 3a: Secondary | ICD-10-CM | POA: Diagnosis not present

## 2020-11-16 DIAGNOSIS — R739 Hyperglycemia, unspecified: Secondary | ICD-10-CM

## 2020-11-16 DIAGNOSIS — I10 Essential (primary) hypertension: Secondary | ICD-10-CM

## 2020-11-16 LAB — CBC WITH DIFFERENTIAL/PLATELET
Basophils Absolute: 0.1 10*3/uL (ref 0.0–0.1)
Basophils Relative: 1.4 % (ref 0.0–3.0)
Eosinophils Absolute: 0.1 10*3/uL (ref 0.0–0.7)
Eosinophils Relative: 1.6 % (ref 0.0–5.0)
HCT: 41.3 % (ref 39.0–52.0)
Hemoglobin: 13.9 g/dL (ref 13.0–17.0)
Lymphocytes Relative: 24.1 % (ref 12.0–46.0)
Lymphs Abs: 1.9 10*3/uL (ref 0.7–4.0)
MCHC: 33.6 g/dL (ref 30.0–36.0)
MCV: 88.6 fl (ref 78.0–100.0)
Monocytes Absolute: 0.8 10*3/uL (ref 0.1–1.0)
Monocytes Relative: 10.4 % (ref 3.0–12.0)
Neutro Abs: 4.9 10*3/uL (ref 1.4–7.7)
Neutrophils Relative %: 62.5 % (ref 43.0–77.0)
Platelets: 217 10*3/uL (ref 150.0–400.0)
RBC: 4.66 Mil/uL (ref 4.22–5.81)
RDW: 14.4 % (ref 11.5–15.5)
WBC: 7.8 10*3/uL (ref 4.0–10.5)

## 2020-11-16 LAB — BASIC METABOLIC PANEL
BUN: 23 mg/dL (ref 6–23)
CO2: 22 mEq/L (ref 19–32)
Calcium: 9.7 mg/dL (ref 8.4–10.5)
Chloride: 108 mEq/L (ref 96–112)
Creatinine, Ser: 1.84 mg/dL — ABNORMAL HIGH (ref 0.40–1.50)
GFR: 36.04 mL/min — ABNORMAL LOW (ref >60.00)
Glucose, Bld: 96 mg/dL (ref 70–99)
Potassium: 4.6 mEq/L (ref 3.5–5.1)
Sodium: 141 mEq/L (ref 135–145)

## 2020-11-16 LAB — LIPID PANEL
Cholesterol: 158 mg/dL (ref 0–200)
HDL: 48 mg/dL (ref >39.00)
LDL Cholesterol: 84 mg/dL (ref 0–99)
NonHDL: 109.91
Total CHOL/HDL Ratio: 3
Triglycerides: 129 mg/dL (ref 0.0–149.0)
VLDL: 25.8 mg/dL (ref 0.0–40.0)

## 2020-11-16 LAB — HEPATIC FUNCTION PANEL
ALT: 15 U/L (ref 0–53)
AST: 17 U/L (ref 0–37)
Albumin: 4.6 g/dL (ref 3.5–5.2)
Alkaline Phosphatase: 46 U/L (ref 39–117)
Bilirubin, Direct: 0.1 mg/dL (ref 0.0–0.3)
Total Bilirubin: 0.7 mg/dL (ref 0.2–1.2)
Total Protein: 6.8 g/dL (ref 6.0–8.3)

## 2020-11-16 LAB — TSH: TSH: 3.11 u[IU]/mL (ref 0.35–5.50)

## 2020-11-16 LAB — HEMOGLOBIN A1C: Hgb A1c MFr Bld: 6.7 % — ABNORMAL HIGH (ref 4.6–6.5)

## 2020-11-16 NOTE — Progress Notes (Signed)
Subjective:    Patient ID: [name] Arntzen., male    DOB: 1945-09-02, 75 y.o.   MRN: [id number]  HPI Here to follow up on issues. He is doing well in general. He had a left knee replacement surgery this summer per [name], and this went well. Now he can swim and exercise again, which makes him very happy. He sees Dr. Roni Bread frequently to follow up on prostate cancer. His PSA continues to slowly climb, but a PET scan on 06-29-20 was negative. He sees Dr. [place] Crane yearly for a skin exam. He saw Dr. Marval Regal in July for his CKD.    Review of Systems  Constitutional: Negative.   HENT: Negative.    Eyes: Negative.   Respiratory: Negative.    Cardiovascular: Negative.   Gastrointestinal: Negative.   Genitourinary: Negative.   Musculoskeletal: Negative.   Skin: Negative.   Neurological: Negative.   Psychiatric/Behavioral: Negative.        Objective:   Physical Exam Constitutional:      General: He is not in acute distress.    Appearance: Normal appearance. He is well-developed. He is not diaphoretic.  HENT:     Head: Normocephalic and atraumatic.     Right Ear: External ear normal.     Left Ear: External ear normal.     Nose: Nose normal.     Mouth/Throat:     Pharynx: No oropharyngeal exudate.  Eyes:     General: No scleral icterus.       Right eye: No discharge.        Left eye: No discharge.     Conjunctiva/sclera: Conjunctivae normal.     Pupils: Pupils are equal, round, and reactive to light.  Neck:     Thyroid: No thyromegaly.     Vascular: No JVD.     Trachea: No tracheal deviation.  Cardiovascular:     Rate and Rhythm: Normal rate and regular rhythm.     Heart sounds: Normal heart sounds. No murmur heard.   No friction rub. No gallop.  Pulmonary:     Effort: Pulmonary effort is normal. No respiratory distress.     Breath sounds: Normal breath sounds. No wheezing or rales.  Chest:     Chest wall: No tenderness.  Abdominal:     General: Bowel sounds are normal.  There is no distension.     Palpations: Abdomen is soft. There is no mass.     Tenderness: There is no abdominal tenderness. There is no guarding or rebound.  Genitourinary:    Penis: No tenderness.   Musculoskeletal:        General: No tenderness. Normal range of motion.     Cervical back: Neck supple.  Lymphadenopathy:     Cervical: No cervical adenopathy.  Skin:    General: Skin is warm and dry.     Coloration: Skin is not pale.     Findings: No erythema or rash.  Neurological:     Mental Status: He is alert and oriented to person, place, and time.     Cranial Nerves: No cranial nerve deficit.     Motor: No abnormal muscle tone.     Coordination: Coordination normal.     Deep Tendon Reflexes: Reflexes are normal and symmetric. Reflexes normal.  Psychiatric:        Behavior: Behavior normal.        Thought Content: Thought content normal.        Judgment: Judgment normal.  Assessment & Plan:  His HTN and CAD and CKD are stable. We will get fasting labs today to check a lipid panel and an A1c for the diabetes. He will see [name] soon for a cardiology exam. His immunizations are UTD. We spent 35 minutes reviewing records and discussing these issues.  Alysia Penna, MD

## 2020-11-23 ENCOUNTER — Telehealth: Payer: Self-pay | Admitting: Pharmacist

## 2020-11-23 NOTE — Chronic Care Management (AMB) (Signed)
Chronic Care Management Pharmacy Assistant   Name: Stephanie Mcglone.  MRN: [id number] DOB: 12-31-1945   Reason for Encounter: General Assessment Call    Conditions to be addressed/monitored: HTN  Recent office visits:  11-16-2020 [name], MD - Patient presented for Essential hypertension and other concerns. No medication changes.   Recent consult visits:  None  Hospital visits:  Medication Reconciliation was completed by comparing discharge summary, patient's EMR and Pharmacy list, and upon discussion with patient.   Admitted to [facility] on 07-10-2020  due to total knee replacement left. He was there for 26 hours.   New?Medications Started at [facility] Discharge:?? -started  None   Medication Changes at Hospital Discharge: -Changed  None   Medications Discontinued at Hospital Discharge: -Stopped  None   Medications that remain the same after Hospital Discharge:??  -All other medications will remain the same.     Medications: Outpatient Encounter Medications as of 11/23/2020  Medication Sig   amLODipine (NORVASC) 10 MG tablet Take 1 tablet (10 mg total) by mouth daily.   bimatoprost (LUMIGAN) 0.01 % SOLN Place 1 drop into both eyes at bedtime.   clopidogrel (PLAVIX) 75 MG tablet Take 1 tablet (75 mg total) by mouth daily with breakfast. (Patient taking differently: Take 75 mg by mouth daily.)   dorzolamide-timolol (COSOPT) 22.3-6.8 MG/ML ophthalmic solution Place 1 drop into both eyes 2 (two) times daily.    Ergocalciferol (VITAMIN D2) 50 MCG (2000 UT) TABS Take 2,000 Units by mouth daily.   famotidine (PEPCID) 20 MG tablet Take 20 mg by mouth at bedtime.    fenofibrate 160 MG tablet Take 160 mg by mouth at bedtime.    fluticasone (FLONASE) 50 MCG/ACT nasal spray Place 1 spray into both nostrils daily as needed for allergies.   isosorbide mononitrate (IMDUR) 30 MG 24 hr tablet Take 30 mg by mouth daily.   magnesium oxide (MAG-OX) 400 MG  tablet Take 400 mg by mouth every evening.    methocarbamol (ROBAXIN) 500 MG tablet Take 1 tablet (500 mg total) by mouth every 8 (eight) hours as needed for muscle spasms.   nitroGLYCERIN (NITROSTAT) 0.4 MG SL tablet Place 0.4 mg under the tongue every 5 (five) minutes as needed for chest pain.   ondansetron (ZOFRAN) 4 MG tablet Take 1 tablet (4 mg total) by mouth daily as needed for nausea or vomiting.   oxyCODONE-acetaminophen (PERCOCET) 5-325 MG tablet Take 1 tablet by mouth every 4 (four) hours as needed for severe pain.   pantoprazole (PROTONIX) 40 MG tablet Take 1 tablet (40 mg total) by mouth daily.   rosuvastatin (CRESTOR) 20 MG tablet Take 20 mg by mouth every evening.    telmisartan (MICARDIS) 80 MG tablet Take 1 tablet (80 mg total) by mouth daily.   No facility-administered encounter medications on file as of 11/23/2020.  Reviewed chart prior to disease state call. Spoke with patient regarding BP  Recent Office Vitals: BP Readings from Last 3 Encounters:  11/16/20 120/78  07/11/20 139/78  07/09/20 (!) 149/85   Pulse Readings from Last 3 Encounters:  11/16/20 (!) 56  07/11/20 75  07/09/20 67    Wt Readings from Last 3 Encounters:  11/16/20 203 lb (92.1 kg)  07/10/20 198 lb 3.2 oz (89.9 kg)  07/09/20 198 lb 3.2 oz (89.9 kg)     Kidney Function Lab Results  Component Value Date/Time   CREATININE 1.84 (H) 11/16/2020 11:36 AM   CREATININE 1.55 (H)  07/11/2020 03:02 AM   CREATININE 1.58 (H) 11/14/2019 09:36 AM   GFR 36.04 (L) 11/16/2020 11:36 AM   GFRNONAA 47 (L) 07/11/2020 03:02 AM   GFRAA 57 (L) 07/23/2019 03:06 AM    BMP Latest Ref Rng & Units 11/16/2020 07/11/2020 07/09/2020  Glucose 70 - 99 mg/dL 96 166(H) 96  BUN 6 - 23 mg/dL 23 19 24(H)  Creatinine 0.40 - 1.50 mg/dL 1.84(H) 1.55(H) 1.76(H)  BUN/Creat Ratio 6 - 22 (calc) - - -  Sodium 135 - 145 mEq/L 141 144 140  Potassium 3.5 - 5.1 mEq/L 4.6 4.1 3.9  Chloride 96 - 112 mEq/L 108 113(H) 111  CO2 19 - 32 mEq/L 22  21(L) 19(L)  Calcium 8.4 - 10.5 mg/dL 9.7 9.1 9.8    Current antihypertensive regimen:  telmisartan 80mg , 1 tablet once daily  How often are you checking your Blood Pressure? Patient reports he is still checking his pressures a few times a week Current home BP readings: The average of his pressures is 120/60 What recent interventions/DTPs have been made by any provider to improve Blood Pressure control since last CPP Visit: Patient reports no changes  Any recent hospitalizations or ED visits since last visit with CPP? None What diet changes have been made to improve Blood Pressure Control?  Patient reports he has a healthy appetite would like to loose a few lbs, but is still watching the sodium intake on his meals. What exercise is being done to improve your Blood Pressure Control?  Patient reports he was out in the yard weed eating when I called and he is still swimming three times a week.  Notes: Patient reports he had been experiencing some swelling in his feet he thinks was due to his surgery, advised he'd noticed when sitting for long periods of time and once he was up and moving around it would go away. Patient reports he is not in need of any fills still doing mail order with the [place] and has no issues or concerns at this time.   Adherence Review: Is the patient currently on ACE/ARB medication? Yes Does the patient have >5 day gap between last estimated fill dates? No    Care Gaps: CCM F/U Call Scheduled - 01-08-2021 10 am AWV - MSG sent to Ramond Craver CMA to schedule.  Star Rating Drugs: Telmisartan (Micardis) 80 mg - Last filled at [facility] mail order [facility]) Rosuvastatin (Crestor) 20 mg - Last filled at [place] mail order  [place] Pharmacist Assistant [phone]          20 min spent

## 2020-12-02 DIAGNOSIS — C61 Malignant neoplasm of prostate: Secondary | ICD-10-CM | POA: Diagnosis not present

## 2020-12-08 DIAGNOSIS — Z4789 Encounter for other orthopedic aftercare: Secondary | ICD-10-CM | POA: Diagnosis not present

## 2020-12-09 DIAGNOSIS — N5231 Erectile dysfunction following radical prostatectomy: Secondary | ICD-10-CM | POA: Diagnosis not present

## 2020-12-09 DIAGNOSIS — C61 Malignant neoplasm of prostate: Secondary | ICD-10-CM | POA: Diagnosis not present

## 2020-12-09 DIAGNOSIS — N393 Stress incontinence (female) (male): Secondary | ICD-10-CM | POA: Diagnosis not present

## 2020-12-24 DIAGNOSIS — L570 Actinic keratosis: Secondary | ICD-10-CM | POA: Diagnosis not present

## 2020-12-24 DIAGNOSIS — X32XXXD Exposure to sunlight, subsequent encounter: Secondary | ICD-10-CM | POA: Diagnosis not present

## 2021-01-07 ENCOUNTER — Telehealth: Payer: Self-pay | Admitting: Pharmacist

## 2021-01-07 NOTE — Chronic Care Management (AMB) (Signed)
    Chronic Care Management Pharmacy Assistant   Name: [name] [name].  MRN: [id number] DOB: Feb 22, 1946  01/07/21 APPOINTMENT REMINDER   Called Patient No answer, left message of appointment on 01/08/21 at 10 via telephone visit with [name], Pharm D.   Notified to have all medications, supplements, blood pressure and/or blood sugar logs available during appointment and to return call if need to reschedule.    Care Gaps: CCM F/U Call Scheduled - 01-08-2021 10 am AWV - 4/22 BP- 120/78 (9/22)   Star Rating Drugs: Telmisartan (Micardis) 80 mg - Last filled at [facility] order [facility]) Rosuvastatin (Crestor) 20 mg - [facility]  [facility]  Medications: Outpatient Encounter Medications as of 01/07/2021  Medication Sig   amLODipine (NORVASC) 10 MG tablet Take 1 tablet (10 mg total) by mouth daily.   bimatoprost (LUMIGAN) 0.01 % SOLN Place 1 drop into both eyes at bedtime.   clopidogrel (PLAVIX) 75 MG tablet Take 1 tablet (75 mg total) by mouth daily with breakfast. (Patient taking differently: Take 75 mg by mouth daily.)   dorzolamide-timolol (COSOPT) 22.3-6.8 MG/ML ophthalmic solution Place 1 drop into both eyes 2 (two) times daily.    Ergocalciferol (VITAMIN D2) 50 MCG (2000 UT) TABS Take 2,000 Units by mouth daily.   famotidine (PEPCID) 20 MG tablet Take 20 mg by mouth at bedtime.    fenofibrate 160 MG tablet Take 160 mg by mouth at bedtime.    fluticasone (FLONASE) 50 MCG/ACT nasal spray Place 1 spray into both nostrils daily as needed for allergies.   isosorbide mononitrate (IMDUR) 30 MG 24 hr tablet Take 30 mg by mouth daily.   magnesium oxide (MAG-OX) 400 MG tablet Take 400 mg by mouth every evening.    methocarbamol (ROBAXIN) 500 MG tablet Take 1 tablet (500 mg total) by mouth every 8 (eight) hours as needed for muscle spasms.   nitroGLYCERIN (NITROSTAT) 0.4 MG SL tablet Place 0.4 mg under the tongue every 5 (five) minutes as needed  for chest pain.   ondansetron (ZOFRAN) 4 MG tablet Take 1 tablet (4 mg total) by mouth daily as needed for nausea or vomiting.   oxyCODONE-acetaminophen (PERCOCET) 5-325 MG tablet Take 1 tablet by mouth every 4 (four) hours as needed for severe pain.   pantoprazole (PROTONIX) 40 MG tablet Take 1 tablet (40 mg total) by mouth daily.   rosuvastatin (CRESTOR) 20 MG tablet Take 20 mg by mouth every evening.    telmisartan (MICARDIS) 80 MG tablet Take 1 tablet (80 mg total) by mouth daily.   No facility-administered encounter medications on file as of 01/07/2021.    [place] Clinical Pharmacist Assistant [phone]

## 2021-01-08 ENCOUNTER — Ambulatory Visit (INDEPENDENT_AMBULATORY_CARE_PROVIDER_SITE_OTHER): Payer: Medicare Other | Admitting: Pharmacist

## 2021-01-08 DIAGNOSIS — E785 Hyperlipidemia, unspecified: Secondary | ICD-10-CM

## 2021-01-08 DIAGNOSIS — I10 Essential (primary) hypertension: Secondary | ICD-10-CM

## 2021-01-08 NOTE — Progress Notes (Signed)
Chronic Care Management Pharmacy Note  01/08/2021 Name:  [name] [name]. MRN:  [id number] DOB:  January 28, 1946  Summary: LDL not at goal < 70 A1c increased to diabetes range  Recommendations/Changes made from today's visit: -Recommend adding diabetes diagnosis to problem list -Recommend increasing rosuvastatin to 40 mg to lower LDL  Plan: Mail healthy plate handout Follow up DM and HLD assessment in 2 months   Subjective: [name] [name]. is an 75 y.o. year old male who is a primary patient of [name], MD.  The CCM team was consulted for assistance with disease management and care coordination needs.    Engaged with patient by telephone for follow up visit in response to provider referral for pharmacy case management and/or care coordination services.   Consent to Services:  The patient was given information about Chronic Care Management services, agreed to services, and gave verbal consent prior to initiation of services.  Please see initial visit note for detailed documentation.   Patient Care Team: [name], MD as PCP - General Josue Hector, MD as PCP - Cardiology (Cardiology) Irine Seal, MD as Attending Physician (Urology) [facility] Milus Banister, MD as Attending Physician (Gastroenterology) Helayne Seminole, MD as Consulting Physician (Otolaryngology) Viona Gilmore, [facility] as Pharmacist (Pharmacist)  Recent office visits: 11-16-2020 [name], MD - Patient presented for Essential hypertension and other concerns. No medication changes.   Recent consult visits: 04-08-2020 Josue Hector, MD - Patient presented for Coronary artery disease involving native coronary artery of native heart without angina pectoris and hypertension. Prescribed Amlodipine Besylate 10 mg.  Hospital visits: Medication Reconciliation was completed by comparing discharge summary, patient's EMR and Pharmacy list, and upon discussion with patient.    Admitted to [facility] on 07-10-2020  due to total knee replacement left. He was there for 26 hours.   New?Medications Started at [facility] Discharge:?? -started  None   Medication Changes at Hospital Discharge: -Changed  None   Medications Discontinued at Hospital Discharge: -Stopped  None   Medications that remain the same after Hospital Discharge:??  -All other medications will remain the same.       Objective:  Lab Results  Component Value Date   CREATININE 1.84 (H) 11/16/2020   BUN 23 11/16/2020   GFR 36.04 (L) 11/16/2020   GFRNONAA 47 (L) 07/11/2020   GFRAA 57 (L) 07/23/2019   NA 141 11/16/2020   K 4.6 11/16/2020   CALCIUM 9.7 11/16/2020   CO2 22 11/16/2020   GLUCOSE 96 11/16/2020    Lab Results  Component Value Date/Time   HGBA1C 6.7 (H) 11/16/2020 11:36 AM   HGBA1C 6.7 (H) 05/13/2020 11:03 AM   GFR 36.04 (L) 11/16/2020 11:36 AM   GFR 38.67 (L) 05/13/2020 11:03 AM    Last diabetic Eye exam: No results found for: HMDIABEYEEXA  Last diabetic Foot exam: No results found for: HMDIABFOOTEX   Lab Results  Component Value Date   CHOL 158 11/16/2020   HDL 48.00 11/16/2020   LDLCALC 84 11/16/2020   LDLDIRECT 82.1 04/07/2011   TRIG 129.0 11/16/2020   CHOLHDL 3 11/16/2020    Hepatic Function Latest Ref Rng & Units 11/16/2020 05/13/2020 11/14/2019  Total Protein 6.0 - 8.3 g/dL 6.8 7.1 6.8  Albumin 3.5 - 5.2 g/dL 4.6 4.6 -  AST 0 - 37 U/L '17 21 18  ' ALT 0 - 53 U/L '15 21 13  ' Alk Phosphatase 39 - 117  U/L 46 45 -  Total Bilirubin 0.2 - 1.2 mg/dL 0.7 0.7 0.7  Bilirubin, Direct 0.0 - 0.3 mg/dL 0.1 0.1 0.1    Lab Results  Component Value Date/Time   TSH 3.11 11/16/2020 11:36 AM   TSH 2.43 05/13/2020 11:03 AM    CBC Latest Ref Rng & Units 11/16/2020 07/11/2020 07/09/2020  WBC 4.0 - 10.5 K/uL 7.8 14.6(H) 9.0  Hemoglobin 13.0 - 17.0 g/dL 13.9 12.6(L) 14.4  Hematocrit 39.0 - 52.0 % 41.3 38.1(L) 43.5  Platelets 150.0 - 400.0 K/uL 217.0 208 251     Lab Results  Component Value Date/Time   VD25OH 23.0 (L) 01/17/2019 10:54 AM    Clinical ASCVD: Yes  The 10-year ASCVD risk score (Arnett DK, et al., 2019) is: 36.8%   Values used to calculate the score:     Age: 24 years     Sex: Male     Is Non-Hispanic African American: No     Diabetic: Yes     Tobacco smoker: No     Systolic Blood Pressure: 240 mmHg     Is BP treated: Yes     HDL Cholesterol: 48 mg/dL     Total Cholesterol: 158 mg/dL    Depression screen [facility] 2/9 11/16/2020 06/30/2020 06/30/2020  Decreased Interest 0 0 0  Down, Depressed, Hopeless 0 0 0  PHQ - 2 Score 0 0 0  Altered sleeping 0 - -  Tired, decreased energy 0 - -  Change in appetite 1 - -  Feeling bad or failure about yourself  0 - -  Trouble concentrating 0 - -  Moving slowly or fidgety/restless 0 - -  Suicidal thoughts 0 - -  PHQ-9 Score 1 - -  Difficult doing work/chores Not difficult at all - -  Some recent data might be hidden      Social History   Tobacco Use  Smoking Status Former   Packs/day: 1.00   Years: 9.00   Pack years: 9.00   Types: Cigarettes   Start date: 03/08/1963   Quit date: 03/07/1972   Years since quitting: 48.8  Smokeless Tobacco Never  Tobacco Comments   States AAA completed    BP Readings from Last 3 Encounters:  11/16/20 120/78  07/11/20 139/78  07/09/20 (!) 149/85   Pulse Readings from Last 3 Encounters:  11/16/20 (!) 56  07/11/20 75  07/09/20 67   Wt Readings from Last 3 Encounters:  11/16/20 203 lb (92.1 kg)  07/10/20 198 lb 3.2 oz (89.9 kg)  07/09/20 198 lb 3.2 oz (89.9 kg)   BMI Readings from Last 3 Encounters:  11/16/20 32.77 kg/m  07/10/20 31.99 kg/m  07/09/20 31.99 kg/m    Assessment/Interventions: Review of patient past medical history, allergies, medications, health status, including review of consultants reports, laboratory and other test data, was performed as part of comprehensive evaluation and provision of chronic care management  services.   SDOH:  (Social Determinants of Health) assessments and interventions performed: No  SDOH Screenings   Alcohol Screen: Low Risk    Last Alcohol Screening Score (AUDIT): 1  Depression (PHQ2-9): Low Risk    PHQ-2 Score: 1  Financial Resource Strain: Not on file  Food Insecurity: No Food Insecurity   Worried About Charity fundraiser in the Last Year: Never true   Ran Out of Food in the Last Year: Never true  Housing: Low Risk    Last Housing Risk Score: 0  Physical Activity: Not on file  Social  Connections: Moderately Integrated   Frequency of Communication with Friends and Family: More than three times a week   Frequency of Social Gatherings with Friends and Family: More than three times a week   Attends Religious Services: Never   Marine scientist or Organizations: Yes   Attends Music therapist: 1 to 4 times per year   Marital Status: Married  Stress: No Stress Concern Present   Feeling of Stress : Not at all  Tobacco Use: Medium Risk   Smoking Tobacco Use: Former   Smokeless Tobacco Use: Never   Passive Exposure: Not on Pensions consultant Needs: No Transportation Needs   Lack of Transportation (Medical): No   Lack of Transportation (Non-Medical): No    CCM Care Plan  No Known Allergies  Medications Reviewed Today     Reviewed by Viona Gilmore, [facility] (Pharmacist) on 01/08/21 at 1034  Med List Status: <None>   Medication Order Taking? Sig Documenting Provider Last Dose Status Informant  amLODipine (NORVASC) 10 MG tablet [id number] No Take 1 tablet (10 mg total) by mouth daily. Josue Hector, MD Taking Active Self  bimatoprost (LUMIGAN) 0.01 % SOLN [id number] No Place 1 drop into both eyes at bedtime. [provider] Taking Active Self  clopidogrel (PLAVIX) 75 MG tablet [id number] No Take 1 tablet (75 mg total) by mouth daily with breakfast.  Patient taking differently: Take 75 mg by mouth daily.   Kathyrn Drown D, NP Taking  Active   dorzolamide-timolol (COSOPT) 22.3-6.8 MG/ML ophthalmic solution [id number] No Place 1 drop into both eyes 2 (two) times daily.  [provider] Taking Active Self  Ergocalciferol (VITAMIN D2) 50 MCG (2000 UT) TABS [id number] No Take 2,000 Units by mouth daily. [provider] Taking Active Self  famotidine (PEPCID) 20 MG tablet [id number] No Take 20 mg by mouth at bedtime.  [provider] Taking Active Self  fenofibrate 160 MG tablet [id number] No Take 160 mg by mouth at bedtime.  [provider] Taking Active Self  fluticasone (FLONASE) 50 MCG/ACT nasal spray [id number] No Place 1 spray into both nostrils daily as needed for allergies. [provider] Taking Active Self  isosorbide mononitrate (IMDUR) 30 MG 24 hr tablet [id number] No Take 30 mg by mouth daily. [provider] Taking Active Self  magnesium oxide (MAG-OX) 400 MG tablet [id number] No Take 400 mg by mouth every evening.  [provider] Taking Active Self  Discontinued 01/08/21 1034 (Completed Course)   nitroGLYCERIN (NITROSTAT) 0.4 MG SL tablet [id number] No Place 0.4 mg under the tongue every 5 (five) minutes as needed for chest pain. [provider] Taking Active Self  Discontinued 01/08/21 1034 (Completed Course)   Discontinued 01/08/21 1034 (Completed Course)   pantoprazole (PROTONIX) 40 MG tablet [id number] No Take 1 tablet (40 mg total) by mouth daily. Burtis Junes, NP Taking Active Self  rosuvastatin (CRESTOR) 20 MG tablet [id number] No Take 20 mg by mouth every evening.  [provider] Taking Active Self  telmisartan (MICARDIS) 80 MG tablet [id number] No Take 1 tablet (80 mg total) by mouth daily. Tanda Rockers, MD Taking Active Self            Patient Active Problem List   Diagnosis Date Noted   H/O total knee replacement, left 07/10/2020   Hyperglycemia 05/13/2020   Status post total knee replacement, right 07/22/2019   Chronic  pain of right knee 05/29/2019   Numbness and tingling 05/29/2019  CKD (chronic kidney disease) stage 3, GFR 30-59 ml/min (HCC) 05/20/2019   Paresthesia 01/17/2019   Upper airway cough syndrome 09/19/2018   DOE (dyspnea on exertion) 08/08/2018   Abnormal cardiac CT angiography 01/17/2018   Angina, class II ([place]) 01/17/2018   Incisional hernia 08/11/2011   PROSTATE CANCER 10/05/2009   Coronary atherosclerosis 10/05/2009   ABNORMAL EKG 10/07/2008   Essential hypertension 04/13/2007   DIVERTICULOSIS, COLON 04/13/2007   Osteoarthritis 04/13/2007   Hyperlipidemia with target LDL less than 70 09/26/2006    Immunization History  Administered Date(s) Administered   Fluad Quad(high Dose 65+) 11/13/2018, 11/14/2019, 11/16/2020   Influenza Split 04/14/2011   Influenza Whole 02/18/2009   Influenza, High Dose Seasonal PF 01/01/2013, 11/06/2014, 11/06/2015, 11/08/2016, 11/07/2017   Influenza,inj,Quad PF,6+ Mos 10/29/2013   Moderna Sars-Covid-2 Vaccination 04/05/2019, 05/03/2019   Pneumococcal Conjugate-13 06/26/2013   Pneumococcal Polysaccharide-23 05/05/2015   Td 05/10/2006   Tdap 02/08/2018   Zoster Recombinat (Shingrix) 08/27/2019, 10/30/2019   Zoster, Live 06/13/2011   Patient has been swimming in the pool almost every day for 45 minutes at the [facility] and he reported this has helped significantly in his recovery. After his knee surgery, he did rehab for 4-6 weeks as well and lost a lot of muscle during this time.   Patient is also dealing with prostate cancer and is now following up with that doctor every 3 months when it was every 6. His PSA jumped up by 1 point, which is why they are monitoring more closely.  Conditions to be addressed/monitored:  Hypertension, Hyperlipidemia, Diabetes, Chronic Kidney Disease, and Osteoarthritis  Conditions addressed this visit: Hypertension, hyperlipidemia, diabetes  Care Plan : CCM Pharmacy Care Plan  Updates made by Viona Gilmore, [place] since  01/08/2021 12:00 AM     Problem: Problem: Hypertension, Hyperlipidemia, Diabetes, Chronic Kidney Disease, and Osteoarthritis      Long-Range Goal: Patient-Specific Goal   Start Date: 01/08/2021  Expected End Date: 01/08/2022  This Visit's Progress: On track  Priority: High  Note:   Current Barriers:  Unable to independently monitor therapeutic efficacy Unable to achieve control of cholesterol   Pharmacist Clinical Goal(s):  Patient will achieve adherence to monitoring guidelines and medication adherence to achieve therapeutic efficacy achieve control of cholesterol as evidenced by lipid panel  through collaboration with PharmD and provider.   Interventions: 1:1 collaboration with [name], MD regarding development and update of comprehensive plan of care as evidenced by provider attestation and co-signature Inter-disciplinary care team collaboration (see longitudinal plan of care) Comprehensive medication review performed; medication list updated in electronic medical record  Hypertension (BP goal <130/80) -Controlled -Current treatment: Amlodipine 10 mg 1 tablet daily Telmisartan 80 mg 1 tablet daily -Medications previously tried: lisinopril (cough)  -Current home readings:  116/60, 134/71 (after walking to mailbox) -Current dietary habits: limits salt intake -Current exercise habits: swimming almost daily -Denies hypotensive/hypertensive symptoms -Educated on BP goals and benefits of medications for prevention of heart attack, stroke and kidney damage; Exercise goal of 150 minutes per week; Importance of home blood pressure monitoring; Proper BP monitoring technique; -Counseled to monitor BP at home weekly, document, and provide log at future appointments -Counseled on diet and exercise extensively Recommended to continue current medication  Hyperlipidemia: (LDL goal < 70) -Uncontrolled -Current treatment: Fenofibrate 160 mg 1 tablet at bedtime Rosuvastatin 20 mg 1  tablet daily -Medications previously tried: none  -Current dietary patterns: has not been paying as close attention to diet lately -Current exercise habits: swimming  almost daily -Educated on Cholesterol goals;  Benefits of statin for ASCVD risk reduction; Importance of limiting foods high in cholesterol; -Counseled on diet and exercise extensively Recommended increasing rosuvastatin to 40 mg daily and sent message to [name].  Coronary atherosclerosis (Goal: prevent heart events) -Controlled -Current treatment  Nitroglycerin 0.4 mg 1 tablet as needed Clopidogrel 75 mg 1 tablet daily Isosorbide mononitrate 30 mg 1 tablet daily -Medications previously tried: none  -Recommended to continue current medication   Diabetes (A1c goal <7%) -Controlled -Current medications: No medications -Medications previously tried: none  -Current home glucose readings fasting glucose: does not check post prandial glucose: does not check -Denies hypoglycemic/hyperglycemic symptoms -Current meal patterns:  breakfast: did not discuss  lunch: did not discuss   dinner: did not discuss  snacks: did not discuss  drinks: did not discuss  -Current exercise: swimming almost daily -Educated on A1c and blood sugar goals; Carbohydrate counting and/or plate method -Counseled to check feet daily and get yearly eye exams -Counseled on diet and exercise extensively Mailed healthy plate handout.  GERD (Goal: minimize symptoms) -Controlled -Current treatment  Pantoprazole 40 mg 1 tablet daily Famotidine 20 mg 1 tablet at bedtime -Medications previously tried: none  -Recommended to continue current medication  Glaucoma (Goal: lower intraocular pressure) -Controlled -Current treatment  Cosopt 1 drop in both eyes twice daily Lumigan 0.01%  1 drop in both eyes at bedtime -Medications previously tried: none  -Recommended to continue current medication   Health Maintenance -Vaccine gaps: COVID  booster -Current therapy:  Vitamin D 2000 units daily Flonase 50 mcg/act as needed Magnesium oxide 400 mg 1 tablet daily -Educated on Cost vs benefit of each product must be carefully weighed by individual consumer -Patient is satisfied with current therapy and denies issues -Recommended to continue current medication  Patient Goals/Self-Care Activities Patient will:  - take medications as prescribed as evidenced by patient report and record review check blood pressure weekly, document, and provide at future appointments target a minimum of 150 minutes of moderate intensity exercise weekly engage in dietary modifications by lowering carb intake  Follow Up Plan: Telephone follow up appointment with care management team member scheduled for: 6 months       Medication Assistance: None required.  Patient affirms current coverage meets needs.  Compliance/Adherence/Medication fill history: Care Gaps: BP- 120/78 (9/22) A1c: 6.7%  Star-Rating Drugs: Telmisartan (Micardis) 80 mg - Last [facility] order [facility]) Rosuvastatin (Crestor) 20 mg - Last filled at [facility] mail order  Patient's preferred pharmacy is:  [facility] [id number] - [place], [place] - [address] [place] [address] Phone: [phone] Fax: [phone]  Uses pill box? Yes Pt endorses 100% compliance  We discussed: Current pharmacy is preferred with insurance plan and patient is satisfied with pharmacy services Patient decided to: Continue current medication management strategy  Care Plan and Follow Up Patient Decision:  Patient agrees to Care Plan and Follow-up.  Plan: Telephone follow up appointment with care management team member scheduled for:  5 months  [name], PharmD, [place] Pharmacist [place] [facility] [place] [phone]

## 2021-01-08 NOTE — Patient Instructions (Signed)
Hi Travell,   It was great to get to catch up with you again! Keep working on making some of those diet changes as we discussed.  Please reach out to me if you have any questions or need anything before our follow up!  Best, Maddie  [name], PharmD, [place] Pharmacist [facility]   Visit Information   Goals Addressed   None    Patient Care Plan: CCM Pharmacy Care Plan     Problem Identified: Problem: Hypertension, Hyperlipidemia, Diabetes, Chronic Kidney Disease, and Osteoarthritis      Long-Range Goal: Patient-Specific Goal   Start Date: 01/08/2021  Expected End Date: 01/08/2022  This Visit's Progress: On track  Priority: High  Note:   Current Barriers:  Unable to independently monitor therapeutic efficacy Unable to achieve control of cholesterol   Pharmacist Clinical Goal(s):  Patient will achieve adherence to monitoring guidelines and medication adherence to achieve therapeutic efficacy achieve control of cholesterol as evidenced by lipid panel  through collaboration with PharmD and provider.   Interventions: 1:1 collaboration with [name], MD regarding development and update of comprehensive plan of care as evidenced by provider attestation and co-signature Inter-disciplinary care team collaboration (see longitudinal plan of care) Comprehensive medication review performed; medication list updated in electronic medical record  Hypertension (BP goal <130/80) -Controlled -Current treatment: Amlodipine 10 mg 1 tablet daily Telmisartan 80 mg 1 tablet daily -Medications previously tried: lisinopril (cough)  -Current home readings:  116/60, 134/71 (after walking to mailbox) -Current dietary habits: limits salt intake -Current exercise habits: swimming almost daily -Denies hypotensive/hypertensive symptoms -Educated on BP goals and benefits of medications for prevention of heart attack, stroke and kidney  damage; Exercise goal of 150 minutes per week; Importance of home blood pressure monitoring; Proper BP monitoring technique; -Counseled to monitor BP at home weekly, document, and provide log at future appointments -Counseled on diet and exercise extensively Recommended to continue current medication  Hyperlipidemia: (LDL goal < 70) -Uncontrolled -Current treatment: Fenofibrate 160 mg 1 tablet at bedtime Rosuvastatin 20 mg 1 tablet daily -Medications previously tried: none  -Current dietary patterns: has not been paying as close attention to diet lately -Current exercise habits: swimming almost daily -Educated on Cholesterol goals;  Benefits of statin for ASCVD risk reduction; Importance of limiting foods high in cholesterol; -Counseled on diet and exercise extensively Recommended increasing rosuvastatin to 40 mg daily and sent message to [name].  Coronary atherosclerosis (Goal: prevent heart events) -Controlled -Current treatment  Nitroglycerin 0.4 mg 1 tablet as needed Clopidogrel 75 mg 1 tablet daily Isosorbide mononitrate 30 mg 1 tablet daily -Medications previously tried: none  -Recommended to continue current medication   Diabetes (A1c goal <7%) -Controlled -Current medications: No medications -Medications previously tried: none  -Current home glucose readings fasting glucose: does not check post prandial glucose: does not check -Denies hypoglycemic/hyperglycemic symptoms -Current meal patterns:  breakfast: did not discuss  lunch: did not discuss   dinner: did not discuss  snacks: did not discuss  drinks: did not discuss  -Current exercise: swimming almost daily -Educated on A1c and blood sugar goals; Carbohydrate counting and/or plate method -Counseled to check feet daily and get yearly eye exams -Counseled on diet and exercise extensively Mailed healthy plate handout. Patient is going to work on limiting carb intake.  GERD (Goal: minimize  symptoms) -Controlled -Current treatment  Pantoprazole 40 mg 1 tablet daily Famotidine 20 mg 1 tablet at bedtime -Medications previously tried: none  -Recommended  to continue current medication  Glaucoma (Goal: lower intraocular pressure) -Controlled -Current treatment  Cosopt 1 drop in both eyes twice daily Lumigan 0.01%  1 drop in both eyes at bedtime -Medications previously tried: none  -Recommended to continue current medication   Health Maintenance -Vaccine gaps: COVID booster -Current therapy:  Vitamin D 2000 units daily Flonase 50 mcg/act as needed Magnesium oxide 400 mg 1 tablet daily -Educated on Cost vs benefit of each product must be carefully weighed by individual consumer -Patient is satisfied with current therapy and denies issues -Recommended to continue current medication  Patient Goals/Self-Care Activities Patient will:  - take medications as prescribed as evidenced by patient report and record review check blood pressure weekly, document, and provide at future appointments target a minimum of 150 minutes of moderate intensity exercise weekly engage in dietary modifications by lowering carb intake  Follow Up Plan: Telephone follow up appointment with care management team member scheduled for: 6 months        Patient verbalizes understanding of instructions provided today and agrees to view in [place].  Telephone follow up appointment with pharmacy team member scheduled for:  Viona Gilmore, [facility]

## 2021-01-19 DIAGNOSIS — Z23 Encounter for immunization: Secondary | ICD-10-CM | POA: Diagnosis not present

## 2021-01-19 DIAGNOSIS — Z20828 Contact with and (suspected) exposure to other viral communicable diseases: Secondary | ICD-10-CM | POA: Diagnosis not present

## 2021-02-03 DIAGNOSIS — H409 Unspecified glaucoma: Secondary | ICD-10-CM

## 2021-02-03 DIAGNOSIS — E785 Hyperlipidemia, unspecified: Secondary | ICD-10-CM | POA: Diagnosis not present

## 2021-02-03 DIAGNOSIS — E1159 Type 2 diabetes mellitus with other circulatory complications: Secondary | ICD-10-CM

## 2021-02-03 DIAGNOSIS — I2510 Atherosclerotic heart disease of native coronary artery without angina pectoris: Secondary | ICD-10-CM | POA: Diagnosis not present

## 2021-02-03 DIAGNOSIS — I10 Essential (primary) hypertension: Secondary | ICD-10-CM | POA: Diagnosis not present

## 2021-02-08 DIAGNOSIS — H0102B Squamous blepharitis left eye, upper and lower eyelids: Secondary | ICD-10-CM | POA: Diagnosis not present

## 2021-02-08 DIAGNOSIS — H0102A Squamous blepharitis right eye, upper and lower eyelids: Secondary | ICD-10-CM | POA: Diagnosis not present

## 2021-02-08 DIAGNOSIS — Z961 Presence of intraocular lens: Secondary | ICD-10-CM | POA: Diagnosis not present

## 2021-02-08 DIAGNOSIS — H401131 Primary open-angle glaucoma, bilateral, mild stage: Secondary | ICD-10-CM | POA: Diagnosis not present

## 2021-02-08 DIAGNOSIS — H43813 Vitreous degeneration, bilateral: Secondary | ICD-10-CM | POA: Diagnosis not present

## 2021-03-05 ENCOUNTER — Telehealth: Payer: Self-pay | Admitting: Family Medicine

## 2021-03-05 ENCOUNTER — Encounter: Payer: Self-pay | Admitting: Family Medicine

## 2021-03-05 MED ORDER — NIRMATRELVIR/RITONAVIR (PAXLOVID) TABLET (RENAL DOSING): 2.0000 | ORAL_TABLET | Freq: Two times a day (BID) | ORAL | 0 refills | Status: AC

## 2021-03-05 NOTE — Telephone Encounter (Signed)
Patient's wife called because patient has covid. Patient and wife would like covid medication sent in    Please send to  [facility] [id number] [place], [place] Phone:  [phone]  Fax:  [phone]         Please advise

## 2021-03-05 NOTE — Telephone Encounter (Signed)
Patient has sent My Chart message with more detailed information.    Please advise

## 2021-03-05 NOTE — Telephone Encounter (Signed)
Per my earlier answer, I have sent in a RX for Paxlovid

## 2021-03-05 NOTE — Telephone Encounter (Signed)
I sent in Paxlovid to [facility]

## 2021-03-18 ENCOUNTER — Telehealth: Payer: Self-pay | Admitting: Pharmacist

## 2021-03-18 NOTE — Chronic Care Management (AMB) (Signed)
Chronic Care Management Pharmacy Assistant   Name: [name] [name].  MRN: [id number] DOB: Jul 23, 1945  Reason for Encounter: Disease State Hypertension Assessment   Conditions to be addressed/monitored: HTN  Recent office visits:  None  Recent consult visits:  None  Hospital visits:  None in previous 6 months  Medications: Outpatient Encounter Medications as of 03/18/2021  Medication Sig   amLODipine (NORVASC) 10 MG tablet Take 1 tablet (10 mg total) by mouth daily.   bimatoprost (LUMIGAN) 0.01 % SOLN Place 1 drop into both eyes at bedtime.   clopidogrel (PLAVIX) 75 MG tablet Take 1 tablet (75 mg total) by mouth daily with breakfast. (Patient taking differently: Take 75 mg by mouth daily.)   dorzolamide-timolol (COSOPT) 22.3-6.8 MG/ML ophthalmic solution Place 1 drop into both eyes 2 (two) times daily.    Ergocalciferol (VITAMIN D2) 50 MCG (2000 UT) TABS Take 2,000 Units by mouth daily.   famotidine (PEPCID) 20 MG tablet Take 20 mg by mouth at bedtime.    fenofibrate 160 MG tablet Take 160 mg by mouth at bedtime.    fluticasone (FLONASE) 50 MCG/ACT nasal spray Place 1 spray into both nostrils daily as needed for allergies.   isosorbide mononitrate (IMDUR) 30 MG 24 hr tablet Take 30 mg by mouth daily.   magnesium oxide (MAG-OX) 400 MG tablet Take 400 mg by mouth every evening.    nitroGLYCERIN (NITROSTAT) 0.4 MG SL tablet Place 0.4 mg under the tongue every 5 (five) minutes as needed for chest pain.   pantoprazole (PROTONIX) 40 MG tablet Take 1 tablet (40 mg total) by mouth daily.   rosuvastatin (CRESTOR) 20 MG tablet Take 20 mg by mouth every evening.    telmisartan (MICARDIS) 80 MG tablet Take 1 tablet (80 mg total) by mouth daily.   No facility-administered encounter medications on file as of 03/18/2021.  03/18/21 Reviewed chart prior to disease state call. Spoke with patient regarding BP  Recent Office Vitals: BP Readings from Last 3 Encounters:  11/16/20 120/78   07/11/20 139/78  07/09/20 (!) 149/85   Pulse Readings from Last 3 Encounters:  11/16/20 (!) 56  07/11/20 75  07/09/20 67    Wt Readings from Last 3 Encounters:  11/16/20 203 lb (92.1 kg)  07/10/20 198 lb 3.2 oz (89.9 kg)  07/09/20 198 lb 3.2 oz (89.9 kg)     Kidney Function Lab Results  Component Value Date/Time   CREATININE 1.84 (H) 11/16/2020 11:36 AM   CREATININE 1.55 (H) 07/11/2020 03:02 AM   CREATININE 1.58 (H) 11/14/2019 09:36 AM   GFR 36.04 (L) 11/16/2020 11:36 AM   GFRNONAA 47 (L) 07/11/2020 03:02 AM   GFRAA 57 (L) 07/23/2019 03:06 AM    BMP Latest Ref Rng & Units 11/16/2020 07/11/2020 07/09/2020  Glucose 70 - 99 mg/dL 96 166(H) 96  BUN 6 - 23 mg/dL 23 19 24(H)  Creatinine 0.40 - 1.50 mg/dL 1.84(H) 1.55(H) 1.76(H)  BUN/Creat Ratio 6 - 22 (calc) - - -  Sodium 135 - 145 mEq/L 141 144 140  Potassium 3.5 - 5.1 mEq/L 4.6 4.1 3.9  Chloride 96 - 112 mEq/L 108 113(H) 111  CO2 19 - 32 mEq/L 22 21(L) 19(L)  Calcium 8.4 - 10.5 mg/dL 9.7 9.1 9.8    Current antihypertensive regimen:  Amlodipine 10 mg 1 tablet daily Telmisartan 80 mg 1 tablet daily How often are you checking your Blood Pressure? Patient reports he has not checked lately as he is at [employer], he reports  that he has been having some headaches but tested positive for COVID about 12 days ago, plans to retest again on today. Advised him I would check back in with him in a few weeks to see how his home readings are, he was in agreement. Current home BP readings:  BP Readings from Last 3 Encounters:  11/16/20 120/78  07/11/20 139/78  07/09/20 (!) 149/85    04/01/21  Current antihypertensive regimen:  Amlodipine 10 mg 1 tablet daily Telmisartan 80 mg 1 tablet daily How often are you checking your Blood Pressure? Patient reports he has been checking daily  Current home BP readings on yesterday was 116/65  he reports it has been within that range, denies any dizziness or lightheadedness. Patient reports he is  still watching his sodium with his meals and has resumed swimming at the [facility] a few times a week.  Care Gaps: AWV - 4/22 BP- 116/65 ( 03/31/21 home)  CCM - 4/23  Star Rating Drugs: Telmisartan (Micardis) 80 mg - Last filled at [facility] mail order [facility]) Rosuvastatin (Crestor) 20 mg - Last filled at [facility] mail order   [place] Pharmacist Assistant [phone]

## 2021-03-25 DIAGNOSIS — C61 Malignant neoplasm of prostate: Secondary | ICD-10-CM | POA: Diagnosis not present

## 2021-04-14 ENCOUNTER — Encounter: Payer: Self-pay | Admitting: Family Medicine

## 2021-04-14 DIAGNOSIS — N1832 Chronic kidney disease, stage 3b: Secondary | ICD-10-CM | POA: Diagnosis not present

## 2021-04-19 DIAGNOSIS — E872 Acidosis, unspecified: Secondary | ICD-10-CM | POA: Diagnosis not present

## 2021-04-19 DIAGNOSIS — E785 Hyperlipidemia, unspecified: Secondary | ICD-10-CM | POA: Diagnosis not present

## 2021-04-19 DIAGNOSIS — I2510 Atherosclerotic heart disease of native coronary artery without angina pectoris: Secondary | ICD-10-CM | POA: Diagnosis not present

## 2021-04-19 DIAGNOSIS — I129 Hypertensive chronic kidney disease with stage 1 through stage 4 chronic kidney disease, or unspecified chronic kidney disease: Secondary | ICD-10-CM | POA: Diagnosis not present

## 2021-04-19 DIAGNOSIS — M179 Osteoarthritis of knee, unspecified: Secondary | ICD-10-CM | POA: Diagnosis not present

## 2021-04-19 DIAGNOSIS — C61 Malignant neoplasm of prostate: Secondary | ICD-10-CM | POA: Diagnosis not present

## 2021-04-19 DIAGNOSIS — N1832 Chronic kidney disease, stage 3b: Secondary | ICD-10-CM | POA: Diagnosis not present

## 2021-04-19 DIAGNOSIS — N281 Cyst of kidney, acquired: Secondary | ICD-10-CM | POA: Diagnosis not present

## 2021-04-26 DIAGNOSIS — C61 Malignant neoplasm of prostate: Secondary | ICD-10-CM | POA: Diagnosis not present

## 2021-04-26 DIAGNOSIS — N5231 Erectile dysfunction following radical prostatectomy: Secondary | ICD-10-CM | POA: Diagnosis not present

## 2021-04-26 DIAGNOSIS — B356 Tinea cruris: Secondary | ICD-10-CM | POA: Diagnosis not present

## 2021-04-26 DIAGNOSIS — N393 Stress incontinence (female) (male): Secondary | ICD-10-CM | POA: Diagnosis not present

## 2021-04-26 NOTE — Progress Notes (Signed)
CARDIOLOGY OFFICE NOTE  Date:  04/30/2021    [name] Date of Birth: 14-Apr-1945 Medical Record #[id number]  PCP:  [name], MD  Cardiologist:  Johnsie Cancel  No chief complaint on file.   History of Present Illness: [name] [name]. is a 76 y.o. male  has a history of HTN, HLD, DM and CAD. He had prior stenting of the LCX in November of 2019. Did have recurrent pain post intervention and was placed on Imdur. Follow up Myoview 02/2018 non ischemic with EF of 60%. Ended up having repeat cath 03/2018 - to manage medically.  Has had chronic dyspnea since his original PCI. This does not appear to be cardiac in nature with Normal CPX 01/07/19    Post right TKR 07/23/19 [facility] No cardiac issues  Seeing [name] for prostate cancer Has recurrence locally post Rx and under observation with serial PSA   BP has been high Seeing renal doctor now for stage 3 CRF Dr Albertine Patricia    Has been vaccinated for COVID    Leaving to [place] to swim with [place] today He is ex Scientist, clinical (histocompatibility and immunogenetics) and fought in [place] HR low but no pre syncope Discussed avoiding any medication to slow further    Past Medical History:  Diagnosis Date   ABNORMAL EKG 10/07/2008   ADVEF, DRUG/MED/BIOL SUBST, OTHER DRUG NOS 09/26/2006   Agent orange exposure    Allergy    seasonal   Cataract    left eye-surgery 07-2015   DEGENERATIVE JOINT DISEASE, MODERATE 04/13/2007   DIVERTICULOSIS, COLON 04/13/2007   ELEVATED PROSTATE SPECIFIC ANTIGEN 04/14/2007   GERD (gastroesophageal reflux disease)    Glaucoma    sees [name]    History of kidney stones    1970    HYPERLIPIDEMIA 09/26/2006   HYPERTENSION 04/13/2007   JOINT EFFUSION, KNEE 06/15/2009   Melanoma ([place])    sees [name]    Numbness and tingling    Pre-diabetes    PROSTATE CANCER 10/05/2009   sees [name]   Renal insufficiency    stage III [facility]     Past Surgical History:  Procedure Laterality Date   ACROMIOPLASTY Right 05-06-14   per Dr.  Macy Mis     bilateral   CATARACT EXTRACTION Left    per [name]    COLONOSCOPY  07/10/2015   per [name], benign polyp, repeat in 10 yrs    CORONARY STENT INTERVENTION N/A 01/17/2018   Procedure: CORONARY STENT INTERVENTION;  Surgeon: [name], MD;  Location: [facility];  Service: Cardiovascular;  Laterality: N/A;   CYSTECTOMY     benign from hand   GLAUCOMA SURGERY Bilateral 2014    heart stent     INSERTION OF MESH N/A 01/10/2013   Procedure: INSERTION OF MESH;  Surgeon: Earnstine Regal, MD;  Location: [facility];  Service: General;  Laterality: N/A;   KNEE ARTHROSCOPY Right Jan. 2013   right knee, per [name]    KNEE ARTHROSCOPY Left 01-14-15   per [name]    LEFT HEART CATH AND CORONARY ANGIOGRAPHY N/A 01/17/2018   Procedure: LEFT HEART CATH AND CORONARY ANGIOGRAPHY;  Surgeon: [name], MD;  Location: [facility];  Service: Cardiovascular;  Laterality: N/A;   LEFT HEART CATH AND CORONARY ANGIOGRAPHY N/A 04/06/2018   Procedure: LEFT HEART CATH AND CORONARY ANGIOGRAPHY;  Surgeon: [name], MD;  Location:  [facility];  Service: Cardiovascular;  Laterality: N/A;   MELANOMA EXCISION  2012   per [name]   PROSTATECTOMY     per [name]   TONSILLECTOMY     TOTAL KNEE ARTHROPLASTY Right 07/22/2019   Procedure: TOTAL KNEE ARTHROPLASTY;  Surgeon: Netta Cedars, MD;  Location: [facility];  Service: Orthopedics;  Laterality: Right;   TOTAL KNEE ARTHROPLASTY Left 07/10/2020   Procedure: TOTAL KNEE ARTHROPLASTY;  Surgeon: Netta Cedars, MD;  Location: [facility];  Service: Orthopedics;  Laterality: Left;   UMBILICAL HERNIA REPAIR  11/01/2011   Procedure: HERNIA REPAIR UMBILICAL ADULT;  Surgeon: Earnstine Regal, MD;  Location: [place];  Service: General;  Laterality: N/A;  Repair umbilical hernia with mesh patch   VASECTOMY     VENTRAL HERNIA REPAIR  01/10/2013   with mesh     [name] HERNIA REPAIR N/A  01/10/2013   Procedure: HERNIA REPAIR VENTRAL ADULT ;  Surgeon: Earnstine Regal, MD;  Location: [place];  Service: General;  Laterality: N/A;     Medications: Current Meds  Medication Sig   amLODipine (NORVASC) 10 MG tablet Take 1 tablet (10 mg total) by mouth daily.   bimatoprost (LUMIGAN) 0.01 % SOLN Place 1 drop into both eyes at bedtime.   clopidogrel (PLAVIX) 75 MG tablet Take 1 tablet (75 mg total) by mouth daily with breakfast. (Patient taking differently: Take 75 mg by mouth daily.)   dorzolamide-timolol (COSOPT) 22.3-6.8 MG/ML ophthalmic solution Place 1 drop into both eyes 2 (two) times daily.    Ergocalciferol (VITAMIN D2) 50 MCG (2000 UT) TABS Take 2,000 Units by mouth daily.   famotidine (PEPCID) 20 MG tablet Take 20 mg by mouth at bedtime.    fenofibrate 160 MG tablet Take 160 mg by mouth at bedtime.    fluticasone (FLONASE) 50 MCG/ACT nasal spray Place 1 spray into both nostrils daily as needed for allergies.   isosorbide mononitrate (IMDUR) 30 MG 24 hr tablet Take 30 mg by mouth daily.   magnesium oxide (MAG-OX) 400 MG tablet Take 400 mg by mouth every evening.    nitroGLYCERIN (NITROSTAT) 0.4 MG SL tablet Place 0.4 mg under the tongue every 5 (five) minutes as needed for chest pain.   pantoprazole (PROTONIX) 40 MG tablet Take 1 tablet (40 mg total) by mouth daily.   rosuvastatin (CRESTOR) 20 MG tablet Take 20 mg by mouth every evening.    telmisartan (MICARDIS) 80 MG tablet Take 1 tablet (80 mg total) by mouth daily.     Allergies: No Known Allergies  Social History: The patient  reports that he quit smoking about 49 years ago. His smoking use included cigarettes. He started smoking about 58 years ago. He has a 9.00 pack-year smoking history. He has never used smokeless tobacco. He reports current alcohol use. He reports that he does not use drugs.   Family History: The patient's family history includes Diabetes in his mother; Heart failure in his father; Prostate cancer  in his father; Stroke in his mother.   Review of Systems: Please see the history of present illness.   All other systems are reviewed and negative.   Physical Exam: VS:  BP 110/80    Pulse (!) 50    Ht 5\' 6"  (1.676 m)    Wt 198 lb (89.8 kg)    SpO2 98%    BMI 31.96 kg/m  .  BMI Body mass index is 31.96 kg/m.  Wt Readings from Last 3 Encounters:  04/30/21 198 lb (89.8 kg)  11/16/20 203 lb (92.1 kg)  07/10/20 198 lb 3.2 oz (89.9 kg)   Affect appropriate Healthy:  appears stated age 43: normal Neck supple with no adenopathy JVP normal no bruits no thyromegaly Lungs clear with no wheezing and good diaphragmatic motion Heart:  S1/S2 no murmur, no rub, gallop or click PMI normal Abdomen: benighn, BS positve, no tenderness, no AAA no bruit.  No HSM or HJR Distal pulses intact with no bruits No edema Neuro non-focal Skin warm and dry Post right TKR     LABORATORY DATA:  EKG:  04/30/2021 NSR rate 57 T wave inversions V3-6 new since 05/01/19   Lab Results  Component Value Date   WBC 7.8 11/16/2020   HGB 13.9 11/16/2020   HCT 41.3 11/16/2020   PLT 217.0 11/16/2020   GLUCOSE 96 11/16/2020   CHOL 158 11/16/2020   TRIG 129.0 11/16/2020   HDL 48.00 11/16/2020   LDLDIRECT 82.1 04/07/2011   LDLCALC 84 11/16/2020   ALT 15 11/16/2020   AST 17 11/16/2020   NA 141 11/16/2020   K 4.6 11/16/2020   CL 108 11/16/2020   CREATININE 1.84 (H) 11/16/2020   BUN 23 11/16/2020   CO2 22 11/16/2020   TSH 3.11 11/16/2020   PSA 0.38 11/06/2015   HGBA1C 6.7 (H) 11/16/2020     BNP (last 3 results) No results for input(s): BNP in the last 8760 hours.  ProBNP (last 3 results) No results for input(s): PROBNP in the last 8760 hours.   Other Studies Reviewed Today:  Cath 04/06/18   Conclusion    Widely patent mid circumflex drug-eluting stent.  The circumflex beyond the stent is unchanged from before with a 50 to 70% distal bifurcation stenosis in a [place] 110 configuration.  The  circumflex is otherwise patent. Left main is normal. LAD is widely patent.  A branch of the first diagonal contains ostial 80% narrowing.  This tertiary branches are small. RCA is widely patent with distal PDA and LV branch diffuse disease. Normal LV function and LVEDP 10 mmHg.   RECOMMENDATIONS:   It is conceivable that angina is due to the very distal bifurcation disease in the circumflex.  It does not appear to be angiographically severe.  Stent implantation would include crossing over an equally large side branch.  Considering the stability, complexity of an interventional procedure (with risk of side branch occlusion), and the far distal location, recommend continued conservative medical management possibly adding either Ranexa or beta-blocker therapy to the regimen.        ASSESSMENT AND PLAN:   1. CAD: 01/17/18 stent to the prox to mid Cx Single vessel disease with good result. persistent symptoms F/U myovue done 03/01/18 low risk with no ischemia EF 60%   Repeat Cath 04/06/18 with widely patent stent with medical Rx recommended    2. Ortho:  Post right TKR [facility] 07/23/19    3. Hypertension: improved with addition of Norvasc   4. HLD:  continue fibrates and statin  crestor increased by primary on 01/08/21 for LDL 88    5. GERD -  Low carb diet protonix   6. DM -  Discussed low carb diet.  Target hemoglobin A1c is 6.5 or less.  Continue current medications. A1c 6.7 11/16/20   7. Chronic dyspnea -  CPX 01/07/19 with no cardiopulmonary limitations Stopped due to ventilatory limit due to body habitus   8. Obesity - see above.  9. Prostate Cancer:  F/u [name] has recurrence in prostate bed likely PET negative 06/30/20   Current medicines are reviewed with the patient today.  The patient does not have concerns regarding medicines other than what has been noted above.  The following changes have been made:  See above.  Labs/ tests ordered today include:   No orders of the defined  types were placed in this encounter.    Disposition:   FU in a year .   Signed: Jenkins Rouge, MD  04/30/2021 8:32 AM  [facility] [address] [place], [place]  [address] Phone: [phone] Fax: [phone]

## 2021-04-30 ENCOUNTER — Encounter: Payer: Self-pay | Admitting: Cardiovascular Disease

## 2021-04-30 ENCOUNTER — Ambulatory Visit (INDEPENDENT_AMBULATORY_CARE_PROVIDER_SITE_OTHER): Payer: Medicare Other | Admitting: Cardiovascular Disease

## 2021-04-30 ENCOUNTER — Other Ambulatory Visit: Payer: Self-pay

## 2021-04-30 VITALS — BP 110/80 | HR 50 | Ht 66.0 in | Wt 198.0 lb

## 2021-04-30 DIAGNOSIS — I2510 Atherosclerotic heart disease of native coronary artery without angina pectoris: Secondary | ICD-10-CM | POA: Diagnosis not present

## 2021-04-30 DIAGNOSIS — R0609 Other forms of dyspnea: Secondary | ICD-10-CM | POA: Diagnosis not present

## 2021-04-30 DIAGNOSIS — I10 Essential (primary) hypertension: Secondary | ICD-10-CM

## 2021-04-30 NOTE — Patient Instructions (Signed)

## 2021-05-10 ENCOUNTER — Telehealth: Payer: Self-pay | Admitting: Pharmacist

## 2021-05-10 NOTE — Chronic Care Management (AMB) (Signed)
? ? ?  Chronic Care Management ?Pharmacy Assistant  ? ?Name: [name] [name].  MRN: [id number] DOB: 1946-02-24 ? ?Reason for Encounter: Offer to Reschedule appointment with [name] Clinical Pharmacist per [facility] ?  ?Recent office visits:  ?None ? ?Recent consult visits:  ?04/30/21 Josue Hector, MD (Cardiology) - Patient presented for Coronary artery disease involving native coronary artery of native heart without angina pectoris and other concerns. No medication changes. ? ?Hospital visits:  ?None in previous 6 months ? ?Medications: ?Outpatient Encounter Medications as of 05/10/2021  ?Medication Sig  ? amLODipine (NORVASC) 10 MG tablet Take 1 tablet (10 mg total) by mouth daily.  ? bimatoprost (LUMIGAN) 0.01 % SOLN Place 1 drop into both eyes at bedtime.  ? clopidogrel (PLAVIX) 75 MG tablet Take 1 tablet (75 mg total) by mouth daily with breakfast. (Patient taking differently: Take 75 mg by mouth daily.)  ? dorzolamide-timolol (COSOPT) 22.3-6.8 MG/ML ophthalmic solution Place 1 drop into both eyes 2 (two) times daily.   ? Ergocalciferol (VITAMIN D2) 50 MCG (2000 UT) TABS Take 2,000 Units by mouth daily.  ? famotidine (PEPCID) 20 MG tablet Take 20 mg by mouth at bedtime.   ? fenofibrate 160 MG tablet Take 160 mg by mouth at bedtime.   ? fluticasone (FLONASE) 50 MCG/ACT nasal spray Place 1 spray into both nostrils daily as needed for allergies.  ? isosorbide mononitrate (IMDUR) 30 MG 24 hr tablet Take 30 mg by mouth daily.  ? magnesium oxide (MAG-OX) 400 MG tablet Take 400 mg by mouth every evening.   ? nitroGLYCERIN (NITROSTAT) 0.4 MG SL tablet Place 0.4 mg under the tongue every 5 (five) minutes as needed for chest pain.  ? pantoprazole (PROTONIX) 40 MG tablet Take 1 tablet (40 mg total) by mouth daily.  ? rosuvastatin (CRESTOR) 20 MG tablet Take 20 mg by mouth every evening.   ? telmisartan (MICARDIS) 80 MG tablet Take 1 tablet (80 mg total) by mouth daily.  ? ?No facility-administered encounter medications on  file as of 05/10/2021.  ? ? ?Care Gaps: ?Eye Exam - Overdue ?CCM- 5/23 ?AWV- 4/22 ?BP- 110/80 ( 04/30/21) ? ?Star Rating Drugs: ?Telmisartan (Micardis) 80 mg - Last filled at [facility] mail order [facility]) ?Rosuvastatin (Crestor) 20 mg - Last filled at [facility] mail order ? ? ? ?Ned Clines CMA ?Clinical Pharmacist Assistant ?[phone] ? ?

## 2021-05-18 ENCOUNTER — Encounter: Payer: Self-pay | Admitting: Family Medicine

## 2021-05-18 ENCOUNTER — Ambulatory Visit (INDEPENDENT_AMBULATORY_CARE_PROVIDER_SITE_OTHER): Payer: Medicare Other | Admitting: Family Medicine

## 2021-05-18 VITALS — BP 124/78 | HR 90 | Temp 98.3°F | Wt 197.2 lb

## 2021-05-18 DIAGNOSIS — E785 Hyperlipidemia, unspecified: Secondary | ICD-10-CM | POA: Diagnosis not present

## 2021-05-18 DIAGNOSIS — L84 Corns and callosities: Secondary | ICD-10-CM

## 2021-05-18 DIAGNOSIS — M2141 Flat foot [pes planus] (acquired), right foot: Secondary | ICD-10-CM | POA: Diagnosis not present

## 2021-05-18 DIAGNOSIS — I2510 Atherosclerotic heart disease of native coronary artery without angina pectoris: Secondary | ICD-10-CM | POA: Diagnosis not present

## 2021-05-18 DIAGNOSIS — B351 Tinea unguium: Secondary | ICD-10-CM | POA: Diagnosis not present

## 2021-05-18 DIAGNOSIS — C61 Malignant neoplasm of prostate: Secondary | ICD-10-CM | POA: Diagnosis not present

## 2021-05-18 DIAGNOSIS — I10 Essential (primary) hypertension: Secondary | ICD-10-CM | POA: Diagnosis not present

## 2021-05-18 DIAGNOSIS — M2142 Flat foot [pes planus] (acquired), left foot: Secondary | ICD-10-CM | POA: Diagnosis not present

## 2021-05-18 DIAGNOSIS — N1831 Chronic kidney disease, stage 3a: Secondary | ICD-10-CM

## 2021-05-18 DIAGNOSIS — R739 Hyperglycemia, unspecified: Secondary | ICD-10-CM | POA: Diagnosis not present

## 2021-05-18 LAB — HEPATIC FUNCTION PANEL
ALT: 18 U/L (ref 0–53)
AST: 21 U/L (ref 0–37)
Albumin: 5.1 g/dL (ref 3.5–5.2)
Alkaline Phosphatase: 47 U/L (ref 39–117)
Bilirubin, Direct: 0.1 mg/dL (ref 0.0–0.3)
Total Bilirubin: 0.7 mg/dL (ref 0.2–1.2)
Total Protein: 7.7 g/dL (ref 6.0–8.3)

## 2021-05-18 LAB — HEMOGLOBIN A1C: Hgb A1c MFr Bld: 6.4 % (ref 4.6–6.5)

## 2021-05-18 LAB — LIPID PANEL
Cholesterol: 191 mg/dL (ref 0–200)
HDL: 54.3 mg/dL (ref >39.00)
LDL Cholesterol: 105 mg/dL — ABNORMAL HIGH (ref 0–99)
NonHDL: 136.37
Total CHOL/HDL Ratio: 4
Triglycerides: 156 mg/dL — ABNORMAL HIGH (ref 0.0–149.0)
VLDL: 31.2 mg/dL (ref 0.0–40.0)

## 2021-05-18 NOTE — Progress Notes (Signed)
? ?  Subjective:  ? ? Patient ID: [name] Gagen., male    DOB: 1945/05/21, 76 y.o.   MRN: [id number] ? ?HPI ?Here for several issues. First he wants to follow up on HTN. He saw [name] on 04-30-21 and he questioned whether his BP was too low. He averages 110/60 at home, and he feels fine. He never gets faint or light headed. He saw Dr. Joelyn Oms for his CKD on 04-19-21, and his creatinine is stable at 1.67. he saw Dr. Roni Bread on 04-26-21 and his PSA is stable at 2.2. His other problems all involve his feet. He has flat arches, and his feet hurt whenever he stands or walks for long periods. He wears supportive shoes. He once had inserts made for him while in [place], but these did not help he also has fungal infection in the right great toe, and he has a painful callus on the bottom of the foot.  ? ? ?Review of Systems  ?Constitutional: Negative.   ?Respiratory: Negative.    ?Cardiovascular: Negative.   ?Musculoskeletal:  Positive for arthralgias.  ? ?   ?Objective:  ? Physical Exam ?Constitutional:   ?   Appearance: Normal appearance.  ?Cardiovascular:  ?   Rate and Rhythm: Normal rate and regular rhythm.  ?   Pulses: Normal pulses.  ?   Heart sounds: Normal heart sounds.  ?Pulmonary:  ?   Effort: Pulmonary effort is normal.  ?   Breath sounds: Normal breath sounds.  ?Musculoskeletal:  ?   Comments: Both feet have very flat arches. The right great toe is thickened and gray with fungal involvement. There is a callus on the undersurface of the first metatarsal head   ?Neurological:  ?   Mental Status: He is alert.  ? ? ? ? ? ?   ?Assessment & Plan:  ?His HTN, renal disease, and prostate cancer all seem to be stable. We will maintain his current medications. We will refer him to Podiatry for the pes planus ,onychomycosis and callus on the foot.  ?We spent a total of ( 32  ) minutes reviewing records and discussing these issues.  ?Alysia Penna, MD ? ? ?

## 2021-05-31 ENCOUNTER — Ambulatory Visit (INDEPENDENT_AMBULATORY_CARE_PROVIDER_SITE_OTHER): Payer: Medicare Other

## 2021-05-31 ENCOUNTER — Ambulatory Visit (INDEPENDENT_AMBULATORY_CARE_PROVIDER_SITE_OTHER): Payer: Medicare Other | Admitting: Podiatry

## 2021-05-31 ENCOUNTER — Other Ambulatory Visit: Payer: Self-pay

## 2021-05-31 ENCOUNTER — Other Ambulatory Visit: Payer: Medicare Other

## 2021-05-31 DIAGNOSIS — M79671 Pain in right foot: Secondary | ICD-10-CM | POA: Diagnosis not present

## 2021-05-31 DIAGNOSIS — Q666 Other congenital valgus deformities of feet: Secondary | ICD-10-CM | POA: Diagnosis not present

## 2021-05-31 DIAGNOSIS — L603 Nail dystrophy: Secondary | ICD-10-CM

## 2021-05-31 DIAGNOSIS — I2510 Atherosclerotic heart disease of native coronary artery without angina pectoris: Secondary | ICD-10-CM | POA: Diagnosis not present

## 2021-05-31 DIAGNOSIS — L84 Corns and callosities: Secondary | ICD-10-CM | POA: Diagnosis not present

## 2021-05-31 DIAGNOSIS — M79672 Pain in left foot: Secondary | ICD-10-CM | POA: Diagnosis not present

## 2021-05-31 NOTE — Patient Instructions (Signed)
I have ordered a medication for you that will come from [facility] in [place]. They should be calling you to verify insurance and will mail the medication to you. If you live close by then you can go by their pharmacy to pick up the medication. Their phone number is [phone]. If you do not hear from them in the next few days, please give us a call at [phone].   

## 2021-05-31 NOTE — Progress Notes (Signed)
SITUATION ?Reason for Consult: Evaluation for Bilateral Custom Foot Orthoses ?Patient / Caregiver Report: Patient is ready for foot orthotics ? ?OBJECTIVE DATA: ?Patient History / Diagnosis:  ?  ICD-10-CM   ?1. Bilateral foot pain  M79.671   ? T62.263   ?  ? ? ?Current or Previous Devices:   None and no history ? ?Foot Examination: ?Skin presentation:   Intact ?Ulcers & Callousing:   None ?Toe / Foot Deformities:  Pes planus ?Weight Bearing Presentation:  Planus ?Sensation:    Intact ? ?Shoe Size:    16M ? ?ORTHOTIC RECOMMENDATION ?Recommended Device: 1x pair of custom functional foot orthotics ? ?GOALS OF ORTHOSES ?- Reduce Pain ?- Prevent Foot Deformity ?- Prevent Progression of Further Foot Deformity ?- Relieve Pressure ?- Improve the Overall Biomechanical Function of the Foot and Lower Extremity. ? ?ACTIONS PERFORMED ?Potential out of pocket cost was communicated to patient. Patient understood and consent to casting. Patient was casted for Foot Orthoses via crush box. Procedure was explained and patient tolerated procedure well. Casts were shipped to central fabrication. All questions were answered and concerns addressed. ? ?PLAN ?Patient is to be called for fitting when devices are ready.  ? ? ?

## 2021-06-01 NOTE — Progress Notes (Signed)
Subjective:  ? ?Patient ID: [name] [name]., male   DOB: 76 y.o.   MRN: [id number]  ? ?HPI ?76 year old male presents the office with concerns of flatfeet as well as calluses that he gets on his right foot submetatarsal 1.  He states he has flatfeet for a long time.  He states that he recently knee surgery and he feels that he has been walking differently which causes foot and hip pain.  He is interested in getting orthotics to see if this will be helpful as he has a friend that had this previously and it was.  Also concerned about nail fungus.  He states the nail started on the right big toe getting thickened discolored after he tripped on the sidewalk.  He states the nail looks like an oyster.  No swelling redness or drainage.  No other concerns. ? ? ?Review of Systems  ?All other systems reviewed and are negative. ? ?Past Medical History:  ?Diagnosis Date  ? ABNORMAL EKG 10/07/2008  ? ADVEF, DRUG/MED/BIOL SUBST, OTHER DRUG NOS 09/26/2006  ? Agent orange exposure   ? Allergy   ? seasonal  ? Cataract   ? left eye-surgery 07-2015  ? DEGENERATIVE JOINT DISEASE, MODERATE 04/13/2007  ? DIVERTICULOSIS, COLON 04/13/2007  ? ELEVATED PROSTATE SPECIFIC ANTIGEN 04/14/2007  ? GERD (gastroesophageal reflux disease)   ? Glaucoma   ? sees [name]   ? History of kidney stones   ? 1970   ? HYPERLIPIDEMIA 09/26/2006  ? HYPERTENSION 04/13/2007  ? JOINT EFFUSION, KNEE 06/15/2009  ? Melanoma ([place])   ? sees [name]   ? Numbness and tingling   ? Pre-diabetes   ? PROSTATE CANCER 10/05/2009  ? sees [name]  ? Renal insufficiency   ? stage III [facility]   ? ? ?Past Surgical History:  ?Procedure Laterality Date  ? ACROMIOPLASTY Right 05-06-14  ? per [name]   ? BUNIONECTOMY    ? bilateral  ? CATARACT EXTRACTION Left   ? per [name]   ? COLONOSCOPY  07/10/2015  ? per [name], benign polyp, repeat in 10 yrs   ? CORONARY STENT INTERVENTION N/A 01/17/2018  ? Procedure: CORONARY STENT INTERVENTION;  Surgeon: [name], MD;   Location: [facility];  Service: Cardiovascular;  Laterality: N/A;  ? CYSTECTOMY    ? benign from hand  ? GLAUCOMA SURGERY Bilateral 2014   ? heart stent    ? INSERTION OF MESH N/A 01/10/2013  ? Procedure: INSERTION OF MESH;  Surgeon: Earnstine Regal, MD;  Location: [facility];  Service: General;  Laterality: N/A;  ? KNEE ARTHROSCOPY Right Jan. 2013  ? right knee, per [name]   ? KNEE ARTHROSCOPY Left 01-14-15  ? per [name]   ? LEFT HEART CATH AND CORONARY ANGIOGRAPHY N/A 01/17/2018  ? Procedure: LEFT HEART CATH AND CORONARY ANGIOGRAPHY;  Surgeon: [name], MD;  Location: [facility];  Service: Cardiovascular;  Laterality: N/A;  ? LEFT HEART CATH AND CORONARY ANGIOGRAPHY N/A 04/06/2018  ? Procedure: LEFT HEART CATH AND CORONARY ANGIOGRAPHY;  Surgeon: [name], MD;  Location: [facility];  Service: Cardiovascular;  Laterality: N/A;  ? MELANOMA EXCISION  2012  ? per [name]  ? PROSTATECTOMY    ? per [name]  ? TONSILLECTOMY    ? TOTAL KNEE ARTHROPLASTY Right 07/22/2019  ? Procedure: TOTAL KNEE ARTHROPLASTY;  Surgeon: Netta Cedars, MD;  Location: [facility];  Service: Orthopedics;  Laterality: Right;  ? TOTAL KNEE ARTHROPLASTY Left 07/10/2020  ? Procedure: TOTAL KNEE ARTHROPLASTY;  Surgeon: Netta Cedars, MD;  Location: [facility];  Service: Orthopedics;  Laterality: Left;  ? UMBILICAL HERNIA REPAIR  11/01/2011  ? Procedure: HERNIA REPAIR UMBILICAL ADULT;  Surgeon: Earnstine Regal, MD;  Location: [facility];  Service: General;  Laterality: N/A;  Repair umbilical hernia with mesh patch  ? VASECTOMY    ? VENTRAL HERNIA REPAIR  01/10/2013  ? with mesh     [name]  ? VENTRAL HERNIA REPAIR N/A 01/10/2013  ? Procedure: HERNIA REPAIR VENTRAL ADULT ;  Surgeon: Earnstine Regal, MD;  Location: [facility];  Service: General;  Laterality: N/A;  ? ? ? ?Current Outpatient Medications:  ?  amLODipine (NORVASC) 10 MG tablet, Take 1 tablet (10 mg total) by mouth daily., Disp: 90 tablet, Rfl: 3 ?   bimatoprost (LUMIGAN) 0.01 % SOLN, Place 1 drop into both eyes at bedtime., Disp: , Rfl:  ?  clopidogrel (PLAVIX) 75 MG tablet, Take 1 tablet (75 mg total) by mouth daily with breakfast. (Patient taking differently: Take 75 mg by mouth daily.), Disp: 90 tablet, Rfl: 8 ?  dorzolamide-timolol (COSOPT) 22.3-6.8 MG/ML ophthalmic solution, Place 1 drop into both eyes 2 (two) times daily. , Disp: , Rfl:  ?  Ergocalciferol (VITAMIN D2) 50 MCG (2000 UT) TABS, Take 2,000 Units by mouth daily., Disp: , Rfl:  ?  famotidine (PEPCID) 20 MG tablet, Take 20 mg by mouth at bedtime. , Disp: , Rfl:  ?  fenofibrate 160 MG tablet, Take 160 mg by mouth at bedtime. , Disp: , Rfl:  ?  fluticasone (FLONASE) 50 MCG/ACT nasal spray, Place 1 spray into both nostrils daily as needed for allergies., Disp: , Rfl:  ?  isosorbide mononitrate (IMDUR) 30 MG 24 hr tablet, Take 30 mg by mouth daily., Disp: , Rfl:  ?  magnesium oxide (MAG-OX) 400 MG tablet, Take 400 mg by mouth every evening. , Disp: , Rfl:  ?  nitroGLYCERIN (NITROSTAT) 0.4 MG SL tablet, Place 0.4 mg under the tongue every 5 (five) minutes as needed for chest pain., Disp: , Rfl:  ?  pantoprazole (PROTONIX) 40 MG tablet, Take 1 tablet (40 mg total) by mouth daily., Disp: 90 tablet, Rfl: 3 ?  rosuvastatin (CRESTOR) 20 MG tablet, Take 20 mg by mouth every evening. , Disp: , Rfl:  ?  telmisartan (MICARDIS) 80 MG tablet, Take 1 tablet (80 mg total) by mouth daily., Disp: 90 tablet, Rfl: 4 ? ?No Known Allergies ? ? ? ? ?   ?Objective:  ?Physical Exam  ?General: AAO x3, NAD ? ?Dermatological: Right hallux nail is hypertrophic, dystrophic with transverse ridging of the nail with brown discoloration.  No significant pain.  No redness or swelling or any drainage.  Hyperkeratotic lesion right foot submetatarsal 1 without any underlying ulceration drainage or signs of infection.  No open lesions. ? ?Vascular: Dorsalis Pedis artery and Posterior Tibial artery pedal pulses are 2/4 bilateral with  immedate capillary fill time. There is no pain with calf compression, swelling, warmth, erythema.  ? ?Neruologic: Grossly intact via light touch bilateral.  ? ?Musculoskeletal: There is a decreased medial arch upon weightbearing bilaterally.  Ankle, subtalar joint range of motion intact.  No areas of pinpoint tenderness.  Muscular strength 5/5 in all groups tested bilateral. ? ?Gait: Unassisted, Nonantalgic.  ? ? ?   ?Assessment:  ? ?76 year old male with pes planovalgus,  hyperkeratotic lesion, onychodystrophy ? ?   ?Plan:  ?-Treatment options discussed including all alternatives, risks, and complications ?-Etiology of symptoms were discussed ?-X-rays were obtained and reviewed with the patient.  3 weightbearing views were obtained bilaterally.  Decreased calcaneal inclination angle.  No evidence of acute fracture. ?-Regards to his flatfeet we discussed inserts, and proceed with this.  He was seen by Aaron Edelman, orthotist in order to get measured for inserts.  Discussed supportive shoe gear.  Discussed traction. ?-Sharply debrided the hyperkeratotic lesion x1 without any complications or bleeding. ?-Discussed the dystrophy of the nail.  Discussed treatment options versus nail removal.  I ordered a compound cream through [facility] to see if this will be helpful.  If no help consider nail removal. ? ?Trula Slade DPM ? ? ?   ? ?

## 2021-06-03 ENCOUNTER — Telehealth: Payer: Self-pay | Admitting: Podiatry

## 2021-06-03 NOTE — Telephone Encounter (Signed)
Pts wife is calling about medication that was going to be sent to [facility]. She called them and there is no record of this medication there. She states that the patient doesn't have RX coverage as he normally gets his medications from the [facility].  ? ?Please advise. ?

## 2021-06-03 NOTE — Telephone Encounter (Signed)
I called and spoke to pts wife. I informed her that the fax was sent to [facility] for medication. She states she will check with them about the cost and if the item is too expensive she will give [place] a call back to have the other recommended medication sent to the [facility] in [place] ?

## 2021-06-05 ENCOUNTER — Other Ambulatory Visit: Payer: Self-pay | Admitting: Podiatry

## 2021-06-05 DIAGNOSIS — Q666 Other congenital valgus deformities of feet: Secondary | ICD-10-CM

## 2021-06-24 DIAGNOSIS — D225 Melanocytic nevi of trunk: Secondary | ICD-10-CM | POA: Diagnosis not present

## 2021-06-24 DIAGNOSIS — L989 Disorder of the skin and subcutaneous tissue, unspecified: Secondary | ICD-10-CM | POA: Diagnosis not present

## 2021-06-24 DIAGNOSIS — D485 Neoplasm of uncertain behavior of skin: Secondary | ICD-10-CM | POA: Diagnosis not present

## 2021-06-24 DIAGNOSIS — Z1283 Encounter for screening for malignant neoplasm of skin: Secondary | ICD-10-CM | POA: Diagnosis not present

## 2021-06-24 DIAGNOSIS — Z08 Encounter for follow-up examination after completed treatment for malignant neoplasm: Secondary | ICD-10-CM | POA: Diagnosis not present

## 2021-06-24 DIAGNOSIS — Z85820 Personal history of malignant melanoma of skin: Secondary | ICD-10-CM | POA: Diagnosis not present

## 2021-06-25 ENCOUNTER — Telehealth: Payer: Medicare Other

## 2021-07-05 ENCOUNTER — Telehealth: Payer: Self-pay | Admitting: Pharmacist

## 2021-07-05 NOTE — Chronic Care Management (AMB) (Signed)
? ? ?  Chronic Care Management ?Pharmacy Assistant  ? ?Name: [name] [name].  MRN: [id number] DOB: [birth year] ?07/05/21 APPOINTMENT REMINDER ? ? ? ?Patient was reminded to have all medications, supplements and any blood glucose and blood pressure readings available for review with [name], Pharm. D, for telephone visit on 07/06/21 at 1. ? ? ? ?Care Gaps: ?Eye Exam - Overdue ?CCM- 5/23 ?AWV- 4/22 ?BP- 110/80 ( 04/30/21) ? ?Star Rating Drug: ?Telmisartan (Micardis) 80 mg - Last filled at [facility] mail order [facility]) ?Rosuvastatin (Crestor) 20 mg - Last filled at [facility] mail order ? ?   ? ? ?Medications: ?Outpatient Encounter Medications as of 07/05/2021  ?Medication Sig  ? amLODipine (NORVASC) 10 MG tablet Take 1 tablet (10 mg total) by mouth daily.  ? bimatoprost (LUMIGAN) 0.01 % SOLN Place 1 drop into both eyes at bedtime.  ? clopidogrel (PLAVIX) 75 MG tablet Take 1 tablet (75 mg total) by mouth daily with breakfast. (Patient taking differently: Take 75 mg by mouth daily.)  ? dorzolamide-timolol (COSOPT) 22.3-6.8 MG/ML ophthalmic solution Place 1 drop into both eyes 2 (two) times daily.   ? Ergocalciferol (VITAMIN D2) 50 MCG (2000 UT) TABS Take 2,000 Units by mouth daily.  ? famotidine (PEPCID) 20 MG tablet Take 20 mg by mouth at bedtime.   ? fenofibrate 160 MG tablet Take 160 mg by mouth at bedtime.   ? fluticasone (FLONASE) 50 MCG/ACT nasal spray Place 1 spray into both nostrils daily as needed for allergies.  ? isosorbide mononitrate (IMDUR) 30 MG 24 hr tablet Take 30 mg by mouth daily.  ? magnesium oxide (MAG-OX) 400 MG tablet Take 400 mg by mouth every evening.   ? nitroGLYCERIN (NITROSTAT) 0.4 MG SL tablet Place 0.4 mg under the tongue every 5 (five) minutes as needed for chest pain.  ? pantoprazole (PROTONIX) 40 MG tablet Take 1 tablet (40 mg total) by mouth daily.  ? rosuvastatin (CRESTOR) 20 MG tablet Take 20 mg by mouth every evening.   ? telmisartan (MICARDIS) 80 MG tablet Take 1 tablet (80 mg total) by mouth  daily.  ? ?No facility-administered encounter medications on file as of 07/05/2021.  ? ? ? ? ?Ned Clines CMA ?Clinical Pharmacist Assistant ?[phone] ? ?

## 2021-07-06 ENCOUNTER — Ambulatory Visit (INDEPENDENT_AMBULATORY_CARE_PROVIDER_SITE_OTHER): Payer: Medicare Other

## 2021-07-06 ENCOUNTER — Ambulatory Visit (INDEPENDENT_AMBULATORY_CARE_PROVIDER_SITE_OTHER): Payer: Medicare Other | Admitting: Pharmacist

## 2021-07-06 DIAGNOSIS — L84 Corns and callosities: Secondary | ICD-10-CM

## 2021-07-06 DIAGNOSIS — Q666 Other congenital valgus deformities of feet: Secondary | ICD-10-CM

## 2021-07-06 DIAGNOSIS — I10 Essential (primary) hypertension: Secondary | ICD-10-CM

## 2021-07-06 DIAGNOSIS — E785 Hyperlipidemia, unspecified: Secondary | ICD-10-CM

## 2021-07-06 DIAGNOSIS — M79671 Pain in right foot: Secondary | ICD-10-CM

## 2021-07-06 NOTE — Progress Notes (Signed)
SITUATION: ?Reason for Visit: Fitting and Delivery of Custom Fabricated Foot Orthoses ?Patient Report: Patient reports comfort and is satisfied with device. ? ?OBJECTIVE DATA: ?Patient History / Diagnosis:   ?  ICD-10-CM   ?1. Pes planovalgus  Q66.6   ?  ?2. Callus  L84   ?  ?3. Bilateral foot pain  M79.671   ? B16.945   ?  ? ? ?Provided Device:  Custom Functional Foot Orthotics ?    [facility]: [id number] ? ?GOAL OF ORTHOSIS ?- Improve gait ?- Decrease energy expenditure ?- Improve Balance ?- Provide Triplanar stability of foot complex ?- Facilitate motion ? ?ACTIONS PERFORMED ?Patient was fit with foot orthotics trimmed to shoe last. Patient tolerated fittign procedure.  ? ?Patient was provided with verbal and written instruction and demonstration regarding donning, doffing, wear, care, proper fit, function, purpose, cleaning, and use of the orthosis and in all related precautions and risks and benefits regarding the orthosis. ? ?Patient was also provided with verbal instruction regarding how to report any failures or malfunctions of the orthosis and necessary follow up care. Patient was also instructed to contact our office regarding any change in status that may affect the function of the orthosis. ? ?Patient demonstrated independence with proper donning, doffing, and fit and verbalized understanding of all instructions. ? ?PLAN: ?Patient is to follow up in one week or as necessary (PRN). All questions were answered and concerns addressed. Plan of care was discussed with and agreed upon by the patient. ? ?

## 2021-07-06 NOTE — Progress Notes (Signed)
? ?Chronic Care Management ?Pharmacy Note ? ?07/06/2021 ?Name:  [name] [name]. MRN:  [id number] DOB:  [birth year] ? ?Summary: ?LDL not at goal < 70 ?BP at goal < 130/80 ? ?Recommendations/Changes made from today's visit: ?-Recommend adding diabetes diagnosis to problem list ?-Recommend increasing rosuvastatin to 40 mg to lower LDL and [facility] ? ?Plan: ?Follow up BP and HLD assessment in 2 months ?Follow up in 6 months ? ?Subjective: ?[name] [name]. is an 76 y.o. year old male who is a primary patient of [name], MD.  The CCM team was consulted for assistance with disease management and care coordination needs.   ? ?Engaged with patient by telephone for follow up visit in response to provider referral for pharmacy case management and/or care coordination services.  ? ?Consent to Services:  ?The patient was given information about Chronic Care Management services, agreed to services, and gave verbal consent prior to initiation of services.  Please see initial visit note for detailed documentation.  ? ?Patient Care Team: ?[name], MD as PCP - General ?Josue Hector, MD as PCP - Cardiology (Cardiology) ?Irine Seal, MD as Attending Physician (Urology) ?[facility] ?Milus Banister, MD as Attending Physician (Gastroenterology) ?Helayne Seminole, MD as Consulting Physician (Otolaryngology) ?Viona Gilmore, [facility] as Pharmacist (Pharmacist) ? ?Recent office visits: ?05/18/20 Alysia Penna, MD: Patient presented for HTN follow up. Referred to podiatry for foot pain/callus and onchomycosis. ? ?Recent consult visits: ?05/31/21 Guadlupe Spanish (orthotics): Patient presented for bilateral foot pain. Recommended a pair of custom functional foot orthotics. ? ?05/31/21 Celesta Gentile, DPM (podiatry): Patient presented for callouses. Prescribed compound cream for dystrophy of the nail. ? ?04/30/21 Josue Hector, MD (Cardiology) - Patient presented for Coronary artery  disease involving native coronary artery of native heart without angina pectoris and other concerns. No medication changes. ? ?04/26/21 Irine Seal, MD (urology): Patient presented for prostate cancer follow up. Unable to access notes. ? ?04/19/21 Pearson Grippe (nephrology): Patient presented for CKD follow up. Unable to access notes. ? ?Hospital visits: ?None in previous 6 months ? ?Objective: ? ?Lab Results  ?Component Value Date  ? CREATININE 1.84 (H) 11/16/2020  ? BUN 23 11/16/2020  ? GFR 36.04 (L) 11/16/2020  ? GFRNONAA 47 (L) 07/11/2020  ? GFRAA 57 (L) 07/23/2019  ? NA 141 11/16/2020  ? K 4.6 11/16/2020  ? CALCIUM 9.7 11/16/2020  ? CO2 22 11/16/2020  ? GLUCOSE 96 11/16/2020  ? ? ?Lab Results  ?Component Value Date/Time  ? HGBA1C 6.4 05/18/2021 10:43 AM  ? HGBA1C 6.7 (H) 11/16/2020 11:36 AM  ? GFR 36.04 (L) 11/16/2020 11:36 AM  ? GFR 38.67 (L) 05/13/2020 11:03 AM  ?  ?Last diabetic Eye exam: No results found for: HMDIABEYEEXA  ?Last diabetic Foot exam: No results found for: HMDIABFOOTEX  ? ?Lab Results  ?Component Value Date  ? CHOL 191 05/18/2021  ? HDL 54.30 05/18/2021  ? LDLCALC 105 (H) 05/18/2021  ? LDLDIRECT 82.1 04/07/2011  ? TRIG 156.0 (H) 05/18/2021  ? CHOLHDL 4 05/18/2021  ? ? ? ?  Latest Ref Rng & Units 05/18/2021  ? 10:43 AM 11/16/2020  ? 11:36 AM 05/13/2020  ? 11:03 AM  ?Hepatic Function  ?Total Protein 6.0 - 8.3 g/dL 7.7   6.8   7.1    ?Albumin 3.5 - 5.2 g/dL 5.1   4.6   4.6    ?AST 0 - 37 U/L 21  17   21    ?ALT 0 - 53 U/L _0 ?Alk Phosphatase 39 - 117 U/L 47   46   45    ?Total Bilirubin 0.2 - 1.2 mg/dL 0.7   0.7   0.7    ?Bilirubin, Direct 0.0 - 0.3 mg/dL 0.1   0.1   0.1    ? ? ?Lab Results  ?Component Value Date/Time  ? TSH 3.11 11/16/2020 11:36 AM  ? TSH 2.43 05/13/2020 11:03 AM  ? ? ? ?  Latest Ref Rng & Units 11/16/2020  ? 11:36 AM 07/11/2020  ?  3:02 AM 07/09/2020  ?  1:48 PM  ?CBC  ?WBC 4.0 - 10.5 K/uL 7.8   14.6   9.0    ?Hemoglobin 13.0 - 17.0 g/dL 13.9   12.6   14.4    ?Hematocrit 39.0  - 52.0 % 41.3   38.1   43.5    ?Platelets 150.0 - 400.0 K/uL 217.0   208   251    ? ? ?Lab Results  ?Component Value Date/Time  ? VD25OH 23.0 (L) 01/17/2019 10:54 AM  ? ? ?Clinical ASCVD: Yes  ?The 10-year ASCVD risk score (Arnett DK, et al., 2019) is: 39.5% ?  Values used to calculate the score: ?    Age: 44 years ?    Sex: Male ?    Is Non-Hispanic African American: No ?    Diabetic: Yes ?    Tobacco smoker: No ?    Systolic Blood Pressure: 626 mmHg ?    Is BP treated: Yes ?    HDL Cholesterol: 54.3 mg/dL ?    Total Cholesterol: 191 mg/dL   ? ? ?  05/18/2021  ? 10:01 AM 11/16/2020  ? 10:51 AM 06/30/2020  ?  8:41 AM  ?Depression screen PHQ 2/9  ?Decreased Interest 0 0 0  ?Down, Depressed, Hopeless 0 0 0  ?PHQ - 2 Score 0 0 0  ?Altered sleeping 0 0   ?Tired, decreased energy 0 0   ?Change in appetite 0 1   ?Feeling bad or failure about yourself  0 0   ?Trouble concentrating 0 0   ?Moving slowly or fidgety/restless 0 0   ?Suicidal thoughts 0 0   ?PHQ-9 Score 0 1   ?Difficult doing work/chores  Not difficult at all   ?  ? ? ?Social History  ? ?Tobacco Use  ?Smoking Status Former  ? Packs/day: 1.00  ? Years: 9.00  ? Pack years: 9.00  ? Types: Cigarettes  ? Start date: 03/08/1963  ? Quit date: 03/07/1972  ? Years since quitting: 49.3  ?Smokeless Tobacco Never  ?Tobacco Comments  ? States AAA completed   ? ?BP Readings from Last 3 Encounters:  ?05/18/21 124/78  ?04/30/21 110/80  ?11/16/20 120/78  ? ?Pulse Readings from Last 3 Encounters:  ?05/18/21 90  ?04/30/21 (!) 50  ?11/16/20 (!) 56  ? ?Wt Readings from Last 3 Encounters:  ?05/18/21 197 lb 4 oz (89.5 kg)  ?04/30/21 198 lb (89.8 kg)  ?11/16/20 203 lb (92.1 kg)  ? ?BMI Readings from Last 3 Encounters:  ?05/18/21 31.84 kg/m?  ?04/30/21 31.96 kg/m?  ?11/16/20 32.77 kg/m?  ? ? ?Assessment/Interventions: Review of patient past medical history, allergies, medications, health status, including review of consultants reports, laboratory and other test data, was performed as part of  comprehensive evaluation and provision of chronic care management services.  ? ?SDOH:  (Social Determinants  of Health) assessments and interventions performed: No ? ?SDOH Screenings  ? ?Alcohol Screen: Not on file  ?Depression (PHQ2-9): Low Risk   ? PHQ-2 Score: 0  ?Financial Resource Strain: Not on file  ?Food Insecurity: Not on file  ?Housing: Not on file  ?Physical Activity: Not on file  ?Social Connections: Not on file  ?Stress: Not on file  ?Tobacco Use: Medium Risk  ? Smoking Tobacco Use: Former  ? Smokeless Tobacco Use: Never  ? Passive Exposure: Not on file  ?Transportation Needs: Not on file  ? ? ?[place] ? ?No Known Allergies ? ?Medications Reviewed Today   ? ? Reviewed by [name], MD (Physician) on 05/18/21 at 1019  Med List Status: <None>  ? ?Medication Order Taking? Sig Documenting Provider Last Dose Status Informant  ?amLODipine (NORVASC) 10 MG tablet [id number] Yes Take 1 tablet (10 mg total) by mouth daily. Josue Hector, MD Taking Active Self  ?bimatoprost (LUMIGAN) 0.01 % SOLN [id number] Yes Place 1 drop into both eyes at bedtime. [provider] Taking Active Self  ?clopidogrel (PLAVIX) 75 MG tablet [id number] Yes Take 1 tablet (75 mg total) by mouth daily with breakfast.  ?Patient taking differently: Take 75 mg by mouth daily.  ? Kathyrn Drown D, NP Taking Active   ?dorzolamide-timolol (COSOPT) 22.3-6.8 MG/ML ophthalmic solution [id number] Yes Place 1 drop into both eyes 2 (two) times daily.  [provider] Taking Active Self  ?Ergocalciferol (VITAMIN D2) 50 MCG (2000 UT) TABS [id number] Yes Take 2,000 Units by mouth daily. [provider] Taking Active Self  ?famotidine (PEPCID) 20 MG tablet [id number] Yes Take 20 mg by mouth at bedtime.  [provider] Taking Active Self  ?fenofibrate 160 MG tablet [id number] Yes Take 160 mg by mouth at bedtime.  [provider] Taking Active Self  ?fluticasone (FLONASE) 50 MCG/ACT nasal spray [id number] Yes  Place 1 spray into both nostrils daily as needed for allergies. [provider] Taking Active Self  ?isosorbide mononitrate (IMDUR) 30 MG 24 hr tablet [id number] Yes Take 30 mg by mouth daily. P

## 2021-07-06 NOTE — Patient Instructions (Signed)
Hi [name] [name], ? ?It was great to catch up again! [facility] your rosu[facility]atin to 40 mg per day based on your cholesterol readings. ? ?Please reach out to me if you have any questions or need anything before our follow up! ? ?Best, ?[name] [name] ? ?[name] [name], PharmD, BCACP ?Clinical Pharmacist ?Therapist, music at [place] ?[phone] ? ? Visit Information ? ? Goals Addressed   ?None ?  ? ?Patient Care Plan: [place]  ?  ? ?Problem Identified: Problem: Hypertension, Hyperlipidemia, Diabetes, Chronic Kidney Disease, and Osteoarthritis   ?  ? ?Long-Range Goal: Patient-Specific Goal   ?Start Date: 01/08/2021  ?Expected End Date: 01/08/2022  ?Recent Progress: On track  ?Priority: High  ?Note:   ?Current Barriers:  ?Unable to independently monitor therapeutic efficacy ?Unable to achieve control of cholesterol  ? ?Pharmacist Clinical Goal(s):  ?Patient will achieve adherence to monitoring guidelines and medication adherence to achieve therapeutic efficacy ?achieve control of cholesterol as evidenced by lipid panel  through collaboration with PharmD and provider.  ? ?Interventions: ?1:1 collaboration with [name] Morale, MD regarding development and update of comprehensive plan of care as evidenced by provider attestation and co-signature ?Inter-disciplinary care team collaboration (see longitudinal plan of care) ?Comprehensive medication review performed; medication list updated in electronic medical record ? ?Hypertension (BP goal <130/80) ?-Controlled ?-Current treatment: ?Amlodipine 10 mg 1 tablet daily - Appropriate, Effective, Safe, Accessible ?Telmisartan 80 mg 1 tablet daily - Appropriate, Effective, Safe, Accessible ?-Medications previously tried: lisinopril (cough)  ?-Current home readings: 120/60 (checking every day); HR high 40s ?-Current dietary habits: limits salt intake ?-Current exercise habits: swimming almost daily ?-Denies hypotensive/hypertensive  symptoms ?-Educated on BP goals and benefits of medications for prevention of heart attack, stroke and kidney damage; ?Exercise goal of 150 minutes per week; ?Importance of home blood pressure monitoring; ?Proper BP monitoring technique; ?-Counseled to monitor BP at home weekly, document, and provide log at future appointments ?-Counseled on diet and exercise extensively ?Recommended to continue current medication ? ?Hyperlipidemia: (LDL goal < 70) ?-Uncontrolled ?-Current treatment: ?Fenofibrate 160 mg 1 tablet at bedtime - Appropriate, Query effective, Safe, Accessible ?Rosuvastatin 20 mg 1 tablet daily - Query Appropriate, Effective, Safe, Accessible ?-Medications previously tried: none  ?-Current dietary patterns: has not been paying as close attention to diet lately ?-Current exercise habits: swimming almost daily ?-Educated on Cholesterol goals;  ?Benefits of statin for ASCVD risk reduction; ?Importance of limiting foods high in cholesterol; ?-Counseled on diet and exercise extensively ?Recommended increasing rosuvastatin to 40 mg daily and sent message to Dr. Johnsie [name]. ? ?Coronary atherosclerosis (Goal: prevent heart events) ?-Controlled ?-Current treatment  ?Nitroglycerin 0.4 mg 1 tablet as needed - Appropriate, Effective, Safe, Accessible ?Clopidogrel 75 mg 1 tablet daily - Appropriate, Effective, Safe, Accessible ?Isosorbide mononitrate 30 mg 1 tablet daily - Appropriate, Effective, Safe, Accessible ?-Medications previously tried: none  ?-Recommended to continue current medication ? ? ?Diabetes (A1c goal <7%) ?-Controlled ?-Current medications: ?No medications ?-Medications previously tried: none  ?-Current home glucose readings ?fasting glucose: does not check ?post prandial glucose: does not check ?-Denies hypoglycemic/hyperglycemic symptoms ?-Current meal patterns:  ?breakfast: did not discuss  ?lunch: did not discuss   ?dinner: did not discuss  ?snacks: did not discuss  ?drinks: did not discuss   ?-Current exercise: swimming almost daily ?-Educated on A1c and blood sugar goals; ?Carbohydrate counting and/or plate method ?-Counseled to check feet daily and get yearly eye exams ?-Counseled on diet and exercise extensively ?Mailed  healthy plate handout. Patient is going to work on limiting carb intake. ? ?GERD (Goal: minimize symptoms) ?-Controlled ?-Current treatment  ?Pantoprazole 40 mg 1 tablet daily - Appropriate, Effective, Safe, Accessible ?Famotidine 20 mg 1 tablet at bedtime - Appropriate, Effective, Safe, Accessible ?-Medications previously tried: none  ?-Recommended to continue current medication ? ?Glaucoma (Goal: lower intraocular pressure) ?-Controlled ?-Current treatment  ?Cosopt 1 drop in both eyes twice daily - Appropriate, Effective, Safe, Accessible ?Lumigan 0.01%  1 drop in both eyes at bedtime - Appropriate, Effective, Safe, Accessible ?-Medications previously tried: none  ?-Recommended to continue current medication ? ? ?Health Maintenance ?-Vaccine gaps: COVID booster ?-Current therapy:  ?Vitamin D 2000 units daily ?Flonase 50 mcg/act as needed ?Magnesium oxide 400 mg 1 tablet daily ?-Educated on Cost vs benefit of each product must be carefully weighed by individual consumer ?-Patient is satisfied with current therapy and denies issues ?-Recommended to continue current medication ? ?Patient Goals/Self-Care Activities ?Patient will:  ?- take medications as prescribed as evidenced by patient report and record review ?check blood pressure weekly, document, and provide at future appointments ?target a minimum of 150 minutes of moderate intensity exercise weekly ?engage in dietary modifications by lowering carb intake ? ?Follow Up Plan: Telephone follow up appointment with care management team member scheduled for: 6 months ? ? ?  ?  ? ?Patient verbalizes understanding of instructions and care plan provided today and agrees to view in [place]. Active MyChart status confirmed with patient.    ?Telephone follow up appointment with pharmacy team member scheduled for: 6 months ? ?[name] [name], RPH  ?

## 2021-07-08 DIAGNOSIS — Z96652 Presence of left artificial knee joint: Secondary | ICD-10-CM | POA: Diagnosis not present

## 2021-07-12 DIAGNOSIS — D225 Melanocytic nevi of trunk: Secondary | ICD-10-CM | POA: Diagnosis not present

## 2021-07-12 DIAGNOSIS — D485 Neoplasm of uncertain behavior of skin: Secondary | ICD-10-CM | POA: Diagnosis not present

## 2021-07-12 DIAGNOSIS — D487 Neoplasm of uncertain behavior of other specified sites: Secondary | ICD-10-CM | POA: Diagnosis not present

## 2021-07-15 ENCOUNTER — Ambulatory Visit (INDEPENDENT_AMBULATORY_CARE_PROVIDER_SITE_OTHER): Payer: Medicare Other

## 2021-07-15 VITALS — Ht 66.0 in | Wt 197.0 lb

## 2021-07-15 DIAGNOSIS — Z0000 Encounter for general adult medical examination without abnormal findings: Secondary | ICD-10-CM | POA: Diagnosis not present

## 2021-07-15 NOTE — Progress Notes (Signed)
? ?Subjective:  ? [name] [name]. is a 76 y.o. male who presents for Medicare Annual/Subsequent preventive examination. ? ?Review of Systems    ?Virtual Visit via Telephone Note ? ?I connected with  [name] [name]. on 07/15/21 at 10:15 AM EDT by telephone and verified that I am speaking with the correct person using two identifiers. ? ?Location: ?Patient: Home ?Provider: Office ?Persons participating in the virtual visit: patient/Nurse Health Advisor ?  ?I discussed the limitations, risks, security and privacy concerns of performing an evaluation and management service by telephone and the availability of in person appointments. The patient expressed understanding and agreed to proceed. ? ?Interactive audio and video telecommunications were attempted between this nurse and patient, however failed, due to patient having technical difficulties OR patient did not have access to video capability.  We continued and completed visit with audio only. ? ?Some vital signs may be absent or patient reported.  ? ?[name] Peaches, LPN  ?Cardiac Risk Factors include: advanced age (>46mn, >>32women);hypertension;male gender ? ?   ?Objective:  ?  ?Today's Vitals  ? 07/15/21 1023  ?Weight: 197 lb (89.4 kg)  ?Height: '5\' 6"'$  (1.676 m)  ? ?Body mass index is 31.8 kg/m?. ? ? ?  07/15/2021  ? 10:37 AM 07/13/2020  ? 11:40 AM 07/10/2020  ?  6:40 PM 07/10/2020  ? 11:26 AM 07/09/2020  ?  1:33 PM 06/30/2020  ?  8:39 AM 07/22/2019  ?  6:20 PM  ?Advanced Directives  ?Does Patient Have a Medical Advance Directive? No No  No No Yes No  ?Type of AVisual merchandiserof [place]Living will   ?Does patient want to make changes to medical advance directive?    No - Patient declined No - Patient declined No - Patient declined   ?Copy of HMary [place] Chart?      No - copy requested   ?Would patient like information on creating a medical advance directive? No - Patient declined  No - Patient declined    No - Patient  declined  ? ? ?Current Medications (verified) ?Outpatient Encounter Medications as of 07/15/2021  ?Medication Sig  ? rosuvastatin (CRESTOR) 20 MG tablet Take 40 mg by mouth every evening.  ? amLODipine (NORVASC) 10 MG tablet Take 1 tablet (10 mg total) by mouth daily.  ? bimatoprost (LUMIGAN) 0.01 % SOLN Place 1 drop into both eyes at bedtime.  ? clopidogrel (PLAVIX) 75 MG tablet Take 1 tablet (75 mg total) by mouth daily with breakfast. (Patient taking differently: Take 75 mg by mouth daily.)  ? dorzolamide-timolol (COSOPT) 22.3-6.8 MG/ML ophthalmic solution Place 1 drop into both eyes 2 (two) times daily.   ? Ergocalciferol (VITAMIN D2) 50 MCG (2000 UT) TABS Take 2,000 Units by mouth daily.  ? famotidine (PEPCID) 20 MG tablet Take 20 mg by mouth at bedtime.   ? fenofibrate 160 MG tablet Take 160 mg by mouth at bedtime.   ? fluticasone (FLONASE) 50 MCG/ACT nasal spray Place 1 spray into both nostrils daily as needed for allergies.  ? isosorbide mononitrate (IMDUR) 30 MG 24 hr tablet Take 30 mg by mouth daily.  ? magnesium oxide (MAG-OX) 400 MG tablet Take 400 mg by mouth every evening.   ? nitroGLYCERIN (NITROSTAT) 0.4 MG SL tablet Place 0.4 mg under the tongue every 5 (five) minutes as needed for chest pain.  ? pantoprazole (PROTONIX) 40 MG tablet Take 1 tablet (40 mg  total) by mouth daily.  ? telmisartan (MICARDIS) 80 MG tablet Take 1 tablet (80 mg total) by mouth daily.  ? ?No facility-administered encounter medications on file as of 07/15/2021.  ? ? ?Allergies (verified) ?Patient has no known allergies.  ? ?History: ?Past Medical History:  ?Diagnosis Date  ? ABNORMAL EKG 10/07/2008  ? ADVEF, DRUG/MED/BIOL SUBST, OTHER DRUG NOS 09/26/2006  ? Agent orange exposure   ? Allergy   ? seasonal  ? Cataract   ? left eye-surgery 07-2015  ? DEGENERATIVE JOINT DISEASE, MODERATE 04/13/2007  ? DIVERTICULOSIS, COLON 04/13/2007  ? ELEVATED PROSTATE SPECIFIC ANTIGEN 04/14/2007  ? GERD (gastroesophageal reflux disease)   ? Glaucoma   ? sees  [name]   ? History of kidney stones   ? 1970   ? HYPERLIPIDEMIA 09/26/2006  ? HYPERTENSION 04/13/2007  ? JOINT EFFUSION, KNEE 06/15/2009  ? Melanoma ([place])   ? sees [name]   ? Numbness and tingling   ? Pre-diabetes   ? PROSTATE CANCER 10/05/2009  ? sees [name]  ? Renal insufficiency   ? stage III [facility]   ? ?Past Surgical History:  ?Procedure Laterality Date  ? ACROMIOPLASTY Right 05-06-14  ? per [name]   ? BUNIONECTOMY    ? bilateral  ? CATARACT EXTRACTION Left   ? per [name]   ? COLONOSCOPY  07/10/2015  ? per [name], benign polyp, repeat in 10 yrs   ? CORONARY STENT INTERVENTION N/A 01/17/2018  ? Procedure: CORONARY STENT INTERVENTION;  Surgeon: [name], MD;  Location: [facility];  Service: Cardiovascular;  Laterality: N/A;  ? CYSTECTOMY    ? benign from hand  ? GLAUCOMA SURGERY Bilateral 2014   ? heart stent    ? INSERTION OF MESH N/A 01/10/2013  ? Procedure: INSERTION OF MESH;  Surgeon: Earnstine Regal, MD;  Location: [facility];  Service: General;  Laterality: N/A;  ? KNEE ARTHROSCOPY Right Jan. 2013  ? right knee, per [name]   ? KNEE ARTHROSCOPY Left 01-14-15  ? per [name]   ? LEFT HEART CATH AND CORONARY ANGIOGRAPHY N/A 01/17/2018  ? Procedure: LEFT HEART CATH AND CORONARY ANGIOGRAPHY;  Surgeon: [name], MD;  Location: [facility];  Service: Cardiovascular;  Laterality: N/A;  ? LEFT HEART CATH AND CORONARY ANGIOGRAPHY N/A 04/06/2018  ? Procedure: LEFT HEART CATH AND CORONARY ANGIOGRAPHY;  Surgeon: [name], MD;  Location: [facility];  Service: Cardiovascular;  Laterality: N/A;  ? MELANOMA EXCISION  2012  ? per [name]  ? PROSTATECTOMY    ? per [name]  ? TONSILLECTOMY    ? TOTAL KNEE ARTHROPLASTY Right 07/22/2019  ? Procedure: TOTAL KNEE ARTHROPLASTY;  Surgeon: Netta Cedars, MD;  Location: [facility];  Service: Orthopedics;  Laterality: Right;  ? TOTAL KNEE ARTHROPLASTY Left 07/10/2020  ? Procedure: TOTAL KNEE ARTHROPLASTY;  Surgeon:  Netta Cedars, MD;  Location: [facility];  Service: Orthopedics;  Laterality: Left;  ? UMBILICAL HERNIA REPAIR  11/01/2011  ? Procedure: HERNIA REPAIR UMBILICAL ADULT;  Surgeon: Earnstine Regal, MD;  Location: [facility];  Service: General;  Laterality: N/A;  Repair umbilical hernia with mesh patch  ? VASECTOMY    ? VENTRAL HERNIA REPAIR  01/10/2013  ? with mesh     [name]  ? VENTRAL HERNIA REPAIR N/A 01/10/2013  ? Procedure: HERNIA REPAIR VENTRAL ADULT ;  Surgeon: Earnstine Regal, MD;  Location: [facility];  Service: General;  Laterality: N/A;  ? ?Family History  ?Problem Relation Age of Onset  ? Stroke Mother   ? Diabetes Mother   ? Heart failure Father   ? Prostate cancer Father   ? Colon cancer Neg Hx   ? Colon polyps Neg Hx   ? Esophageal cancer Neg Hx   ? Rectal cancer Neg Hx   ? Stomach cancer Neg Hx   ? ?Social History  ? ?Socioeconomic History  ? Marital status: Married  ?  Spouse name: Charlett Nose  ? Number of children: 2  ? Years of education: 76  ? Highest education level: High school graduate  ?Occupational History  ? Occupation: Administrator, sports  ?  Comment: retired  ?Tobacco Use  ? Smoking status: Former  ?  Packs/day: 1.00  ?  Years: 9.00  ?  Pack years: 9.00  ?  Types: Cigarettes  ?  Start date: 03/08/1963  ?  Quit date: 03/07/1972  ?  Years since quitting: 49.3  ? Smokeless tobacco: Never  ? Tobacco comments:  ?  States AAA completed   ?Vaping Use  ? Vaping Use: Never used  ?Substance and Sexual Activity  ? Alcohol use: Yes  ?  Comment: occasional  ? Drug use: Never  ? Sexual activity: Not on file  ?Other Topics Concern  ? Not on file  ?Social History Narrative  ? Was in the [employer]; Office manager  ? Currently can go to the [place]  ? Agent orange exposure  ? Right-handed.  ? 2-3 cups caffeine daily,  ?   ? Wife; married 62 years  ? 2 children; grands no   ?   ? 05/11/2018:   ? Lives with wife on one level home  ? Enjoys going swimming at gym 3 days/week; currently enjoying cardiac rehab  ? Has one son,  one daughter, and one granddaughter who all live in [place]. Travels to see them every other week.  ?   ? ?Social Determinants of Health  ? ?Financial Resource Strain: Low Risk   ? Difficulty of Paying [place]

## 2021-07-15 NOTE — Patient Instructions (Addendum)
?[name] , ?Thank you for taking time to come for your Medicare Wellness Visit. I appreciate your ongoing commitment to your health goals. Please review the following plan we discussed and let me know if I can assist you in the future.  ? ?These are the goals we discussed: ? Goals   ? ?   Increase physical activity (pt-stated)   ?   Keep going swimming and lose a little weight. ?  ?   Patient Stated   ?   Get endoscopy, and keep swimming and diving! Safe travels in April! ? ?  ?   Weight (lb) < 175 lb (79.4 kg)   ?   Keep going swimming ? ?Check out  online nutrition programs as GumSearch.nl and http://vang.com/; fit4m; ?Look for foods with "whole" wheat; bran; oatmeal etc ?Shot at the farmer's markets in season for fresher choices  ?Watch for "hydrogenated" on the label of oils which are trans-fats.  ?Watch for "high fructose corn syrup" in snacks, yogurt or ketchup  ?Meats have less marbling; bright colored fruits and vegetables;  ?Canned; dump out liquid and wash vegetables. ?Be mindful of what we are eating  ?Portion control is essential to a health weight! ?Sit down; take a break and enjoy your meal; take smaller bites; put the fork down between bites;  ?It takes 20 minutes to get full; so check in with your fullness cues and stop eating when you start to fill full  ? ? ? ? ? ? ? ?  ? ?  ?  ?This is a list of the screening recommended for you and due dates:  ?Health Maintenance  ?Topic Date Due  ? Eye exam for diabetics  02/02/2021  ? COVID-19 Vaccine (3 - Moderna risk series) 12/03/2021*  ? Flu Shot  10/05/2021  ? Hemoglobin A1C  11/18/2021  ? Colon Cancer Screening  07/19/2025  ? Tetanus Vaccine  02/09/2028  ? Pneumonia Vaccine  Completed  ? Zoster (Shingles) Vaccine  Completed  ? HPV Vaccine  Aged Out  ? Complete foot exam   Discontinued  ?*Topic was postponed. The date shown is not the original due date.  ? ?Advanced directives: No  ? ?Conditions/risks identified: None ? ?Next appointment: Follow up  in one year for your annual wellness visit.  ? ?Preventive Care 667Years and Older, Male ?Preventive care refers to lifestyle choices and visits with your health care provider that can promote health and wellness. ?What does preventive care include? ?A yearly physical exam. This is also called an annual well check. ?Dental exams once or twice a year. ?Routine eye exams. Ask your health care provider how often you should have your eyes checked. ?Personal lifestyle choices, including: ?Daily care of your teeth and gums. ?Regular physical activity. ?Eating a healthy diet. ?Avoiding tobacco and drug use. ?Limiting alcohol use. ?Practicing safe sex. ?Taking low doses of aspirin every day. ?Taking vitamin and mineral supplements as recommended by your health care provider. ?What happens during an annual well check? ?The services and screenings done by your health care provider during your annual well check will depend on your age, overall health, lifestyle risk factors, and family history of disease. ?Counseling  ?Your health care provider may ask you questions about your: ?Alcohol use. ?Tobacco use. ?Drug use. ?Emotional well-being. ?Home and relationship well-being. ?Sexual activity. ?Eating habits. ?History of falls. ?Memory and ability to understand (cognition). ?Work and work eStatistician ?Screening  ?You may have the following tests or measurements: ?Height, weight,  and BMI. ?Blood pressure. ?Lipid and cholesterol levels. These may be checked every 5 years, or more frequently if you are over 66 years old. ?Skin check. ?Lung cancer screening. You may have this screening every year starting at age 21 if you have a 30-pack-year history of smoking and currently smoke or have quit within the past 15 years. ?Fecal occult blood test (FOBT) of the stool. You may have this test every year starting at age 23. ?Flexible sigmoidoscopy or colonoscopy. You may have a sigmoidoscopy every 5 years or a colonoscopy every 10 years  starting at age 49. ?Prostate cancer screening. Recommendations will vary depending on your family history and other risks. ?Hepatitis C blood test. ?Hepatitis B blood test. ?Sexually transmitted disease (STD) testing. ?Diabetes screening. This is done by checking your blood sugar (glucose) after you have not eaten for a while (fasting). You may have this done every 1-3 years. ?Abdominal aortic aneurysm (AAA) screening. You may need this if you are a current or former smoker. ?Osteoporosis. You may be screened starting at age 87 if you are at high risk. ?Talk with your health care provider about your test results, treatment options, and if necessary, the need for more tests. ?Vaccines  ?Your health care provider may recommend certain vaccines, such as: ?Influenza vaccine. This is recommended every year. ?Tetanus, diphtheria, and acellular pertussis (Tdap, Td) vaccine. You may need a Td booster every 10 years. ?Zoster vaccine. You may need this after age 61. ?Pneumococcal 13-valent conjugate (PCV13) vaccine. One dose is recommended after age 37. ?Pneumococcal polysaccharide (PPSV23) vaccine. One dose is recommended after age 35. ?Talk to your health care provider about which screenings and vaccines you need and how often you need them. ?This information is not intended to replace advice given to you by your health care provider. Make sure you discuss any questions you have with your health care provider. ?Document Released: 03/20/2015 Document Revised: 11/11/2015 Document Reviewed: 12/23/2014 ?Elsevier Interactive Patient Education ? 2017 [place]. ? ?Fall Prevention in the Home ?Falls can cause injuries. They can happen to people of all ages. There are many things you can do to make your home safe and to help prevent falls. ?What can I do on the outside of my home? ?Regularly fix the edges of walkways and driveways and fix any cracks. ?Remove anything that might make you trip as you walk through a door, such as  a raised step or threshold. ?Trim any bushes or trees on the path to your home. ?Use bright outdoor lighting. ?Clear any walking paths of anything that might make someone trip, such as rocks or tools. ?Regularly check to see if handrails are loose or broken. Make sure that both sides of any steps have handrails. ?Any raised decks and porches should have guardrails on the edges. ?Have any leaves, snow, or ice cleared regularly. ?Use sand or salt on walking paths during winter. ?Clean up any spills in your garage right away. This includes oil or grease spills. ?What can I do in the bathroom? ?Use night lights. ?Install grab bars by the toilet and in the tub and shower. Do not use towel bars as grab bars. ?Use non-skid mats or decals in the tub or shower. ?If you need to sit down in the shower, use a plastic, non-slip stool. ?Keep the floor dry. Clean up any water that spills on the floor as soon as it happens. ?Remove soap buildup in the tub or shower regularly. ?Attach bath mats securely with  double-sided non-slip rug tape. ?Do not have throw rugs and other things on the floor that can make you trip. ?What can I do in the bedroom? ?Use night lights. ?Make sure that you have a light by your bed that is easy to reach. ?Do not use any sheets or blankets that are too big for your bed. They should not hang down onto the floor. ?Have a firm chair that has side arms. You can use this for support while you get dressed. ?Do not have throw rugs and other things on the floor that can make you trip. ?What can I do in the kitchen? ?Clean up any spills right away. ?Avoid walking on wet floors. ?Keep items that you use a lot in easy-to-reach places. ?If you need to reach something above you, use a strong step stool that has a grab bar. ?Keep electrical cords out of the way. ?Do not use floor polish or wax that makes floors slippery. If you must use wax, use non-skid floor wax. ?Do not have throw rugs and other things on the floor  that can make you trip. ?What can I do with my stairs? ?Do not leave any items on the stairs. ?Make sure that there are handrails on both sides of the stairs and use them. Fix handrails that are broken o

## 2021-08-04 DIAGNOSIS — H409 Unspecified glaucoma: Secondary | ICD-10-CM

## 2021-08-04 DIAGNOSIS — E785 Hyperlipidemia, unspecified: Secondary | ICD-10-CM

## 2021-08-04 DIAGNOSIS — E1159 Type 2 diabetes mellitus with other circulatory complications: Secondary | ICD-10-CM | POA: Diagnosis not present

## 2021-08-04 DIAGNOSIS — I2510 Atherosclerotic heart disease of native coronary artery without angina pectoris: Secondary | ICD-10-CM | POA: Diagnosis not present

## 2021-08-04 DIAGNOSIS — I10 Essential (primary) hypertension: Secondary | ICD-10-CM

## 2021-08-04 DIAGNOSIS — Z87891 Personal history of nicotine dependence: Secondary | ICD-10-CM

## 2021-09-03 DIAGNOSIS — H0102B Squamous blepharitis left eye, upper and lower eyelids: Secondary | ICD-10-CM | POA: Diagnosis not present

## 2021-09-03 DIAGNOSIS — H43813 Vitreous degeneration, bilateral: Secondary | ICD-10-CM | POA: Diagnosis not present

## 2021-09-03 DIAGNOSIS — H0102A Squamous blepharitis right eye, upper and lower eyelids: Secondary | ICD-10-CM | POA: Diagnosis not present

## 2021-09-03 DIAGNOSIS — Z961 Presence of intraocular lens: Secondary | ICD-10-CM | POA: Diagnosis not present

## 2021-09-03 DIAGNOSIS — H401131 Primary open-angle glaucoma, bilateral, mild stage: Secondary | ICD-10-CM | POA: Diagnosis not present

## 2021-09-10 ENCOUNTER — Telehealth: Payer: Self-pay | Admitting: Pharmacist

## 2021-09-10 NOTE — Chronic Care Management (AMB) (Signed)
Chronic Care Management Pharmacy Assistant   Name: [name] [name].  MRN: [id number] DOB: February 16, 1946  Reason for Encounter: Disease State   Conditions to be addressed/monitored: HLD/ Hypertension  Recent office visits:  07/15/21 [name], LPN - Patient presented or Medicare Annual Wellness exam. Patient voiced goal of increased physical activity   Recent consult visits:  07/08/21 [name] (Orthopaedic Surg) - Patient presented for History of left total knee replacement. No other visit details available.  Hospital visits:  None in previous 6 months  Medications: Outpatient Encounter Medications as of 09/10/2021  Medication Sig   amLODipine (NORVASC) 10 MG tablet Take 1 tablet (10 mg total) by mouth daily.   bimatoprost (LUMIGAN) 0.01 % SOLN Place 1 drop into both eyes at bedtime.   clopidogrel (PLAVIX) 75 MG tablet Take 1 tablet (75 mg total) by mouth daily with breakfast. (Patient taking differently: Take 75 mg by mouth daily.)   dorzolamide-timolol (COSOPT) 22.3-6.8 MG/ML ophthalmic solution Place 1 drop into both eyes 2 (two) times daily.    Ergocalciferol (VITAMIN D2) 50 MCG (2000 UT) TABS Take 2,000 Units by mouth daily.   famotidine (PEPCID) 20 MG tablet Take 20 mg by mouth at bedtime.    fenofibrate 160 MG tablet Take 160 mg by mouth at bedtime.    fluticasone (FLONASE) 50 MCG/ACT nasal spray Place 1 spray into both nostrils daily as needed for allergies.   isosorbide mononitrate (IMDUR) 30 MG 24 hr tablet Take 30 mg by mouth daily.   magnesium oxide (MAG-OX) 400 MG tablet Take 400 mg by mouth every evening.    nitroGLYCERIN (NITROSTAT) 0.4 MG SL tablet Place 0.4 mg under the tongue every 5 (five) minutes as needed for chest pain.   pantoprazole (PROTONIX) 40 MG tablet Take 1 tablet (40 mg total) by mouth daily.   rosuvastatin (CRESTOR) 20 MG tablet Take 40 mg by mouth every evening.   telmisartan (MICARDIS) 80 MG tablet Take 1 tablet (80 mg total) by mouth  daily.   No facility-administered encounter medications on file as of 09/10/2021.  Reviewed chart prior to disease state call. Spoke with patient regarding BP  Recent Office Vitals: BP Readings from Last 3 Encounters:  05/18/21 124/78  04/30/21 110/80  11/16/20 120/78   Pulse Readings from Last 3 Encounters:  05/18/21 90  04/30/21 (!) 50  11/16/20 (!) 56    Wt Readings from Last 3 Encounters:  07/15/21 197 lb (89.4 kg)  05/18/21 197 lb 4 oz (89.5 kg)  04/30/21 198 lb (89.8 kg)     Kidney Function Lab Results  Component Value Date/Time   CREATININE 1.84 (H) 11/16/2020 11:36 AM   CREATININE 1.55 (H) 07/11/2020 03:02 AM   CREATININE 1.58 (H) 11/14/2019 09:36 AM   GFR 36.04 (L) 11/16/2020 11:36 AM   GFRNONAA 47 (L) 07/11/2020 03:02 AM   GFRAA 57 (L) 07/23/2019 03:06 AM       Latest Ref Rng & Units 11/16/2020   11:36 AM 07/11/2020    3:02 AM 07/09/2020    1:48 PM  BMP  Glucose 70 - 99 mg/dL 96  166  96   BUN 6 - 23 mg/dL '23  19  24   '$ Creatinine 0.40 - 1.50 mg/dL 1.84  1.55  1.76   Sodium 135 - 145 mEq/L 141  144  140   Potassium 3.5 - 5.1 mEq/L 4.6  4.1  3.9   Chloride 96 - 112 mEq/L 108  113  111  CO2 19 - 32 mEq/L '22  21  19   '$ Calcium 8.4 - 10.5 mg/dL 9.7  9.1  9.8     Current antihypertensive regimen:  Amlodipine 10 mg 1 tablet daily - Appropriate, Effective, Safe, Accessible Telmisartan 80 mg 1 tablet daily - Appropriate, Effective, Safe, Accessible How often are you checking your Blood Pressure? several times per month Current home BP readings: 130/72 or around that range. Patient denies any hyper/hypotensive symptoms. What recent interventions/DTPs have been made by any provider to improve Blood Pressure control since last CPP Visit: Patient reports no changes. Any recent hospitalizations or ED visits since last visit with CPP? No What exercise is being done to improve your Blood Pressure Control?  Patient reports he has still been keeping up with the  swimming.  Adherence Review: Is the patient currently on ACE/ARB medication? Yes Does the patient have >5 day gap between last estimated fill dates? No   09/10/2021 Name: [name] [name]. MRN: [id number] DOB: 09-Oct-1945 [name] [name]. is a 76 y.o. year old male who is a primary care patient of [name], MD.  Comprehensive medication review performed; Spoke to patient regarding cholesterol  Lipid Panel    Component Value Date/Time   CHOL 191 05/18/2021 1043   TRIG 156.0 (H) 05/18/2021 1043   HDL 54.30 05/18/2021 1043   LDLCALC 105 (H) 05/18/2021 1043   LDLCALC 98 11/14/2019 0936   LDLDIRECT 82.1 04/07/2011 0807    10-year ASCVD risk score: The ASCVD Risk score (Arnett DK, et al., 2019) failed to calculate for the following reasons:   The systolic blood pressure is missing  Current antihyperlipidemic regimen:  Fenofibrate 160 mg 1 tablet at bedtime - Appropriate, Query effective, Safe, Accessible Rosuvastatin 20 mg 1 tablet daily - Query Appropriate, Effective, Safe, Accessible ASCVD risk enhancing conditions: age >6 What recent interventions/DTPs have been made by any provider to improve Cholesterol control since last CPP Visit: Patient reports he has per provider instruction increased Crestor to 40 mg daily. He reports he has been tolerating well does not notice any changes thus far has been about a month since the change. No cramping or other side effects.  Adherence Review: Does the patient have >5 day gap between last estimated fill dates? No    Notes: Patient reports no issues or concerns at this time, reports he is due to soon follow up with [facility] prior to PCP.  Care Gaps: Eye Exam - Overdue COVID Booster - Postponed BP- 124/78 05/18/21 AWV- 5/23 CCM- 11/23   Star Rating Drugs: Telmisartan (Micardis) 80 mg - Last filled at [facility] mail order [facility]) Rosuvastatin (Crestor) 20 mg - Last filled at [facility] mail order    [place] Pharmacist Assistant [phone]

## 2021-09-14 ENCOUNTER — Telehealth: Payer: Self-pay | Admitting: Family Medicine

## 2021-09-14 DIAGNOSIS — I10 Essential (primary) hypertension: Secondary | ICD-10-CM

## 2021-09-14 DIAGNOSIS — N1831 Chronic kidney disease, stage 3a: Secondary | ICD-10-CM

## 2021-09-14 NOTE — Telephone Encounter (Signed)
Done

## 2021-09-29 DIAGNOSIS — N1832 Chronic kidney disease, stage 3b: Secondary | ICD-10-CM | POA: Diagnosis not present

## 2021-10-11 DIAGNOSIS — E872 Acidosis, unspecified: Secondary | ICD-10-CM | POA: Diagnosis not present

## 2021-10-11 DIAGNOSIS — N281 Cyst of kidney, acquired: Secondary | ICD-10-CM | POA: Diagnosis not present

## 2021-10-11 DIAGNOSIS — E785 Hyperlipidemia, unspecified: Secondary | ICD-10-CM | POA: Diagnosis not present

## 2021-10-11 DIAGNOSIS — C61 Malignant neoplasm of prostate: Secondary | ICD-10-CM | POA: Diagnosis not present

## 2021-10-11 DIAGNOSIS — M179 Osteoarthritis of knee, unspecified: Secondary | ICD-10-CM | POA: Diagnosis not present

## 2021-10-11 DIAGNOSIS — N1832 Chronic kidney disease, stage 3b: Secondary | ICD-10-CM | POA: Diagnosis not present

## 2021-10-11 DIAGNOSIS — I2510 Atherosclerotic heart disease of native coronary artery without angina pectoris: Secondary | ICD-10-CM | POA: Diagnosis not present

## 2021-10-11 DIAGNOSIS — I129 Hypertensive chronic kidney disease with stage 1 through stage 4 chronic kidney disease, or unspecified chronic kidney disease: Secondary | ICD-10-CM | POA: Diagnosis not present

## 2021-10-15 DIAGNOSIS — C61 Malignant neoplasm of prostate: Secondary | ICD-10-CM | POA: Diagnosis not present

## 2021-10-25 DIAGNOSIS — C61 Malignant neoplasm of prostate: Secondary | ICD-10-CM | POA: Diagnosis not present

## 2021-10-25 DIAGNOSIS — N393 Stress incontinence (female) (male): Secondary | ICD-10-CM | POA: Diagnosis not present

## 2021-10-25 DIAGNOSIS — N5231 Erectile dysfunction following radical prostatectomy: Secondary | ICD-10-CM | POA: Diagnosis not present

## 2021-11-15 DIAGNOSIS — L820 Inflamed seborrheic keratosis: Secondary | ICD-10-CM | POA: Diagnosis not present

## 2021-11-15 DIAGNOSIS — Z85820 Personal history of malignant melanoma of skin: Secondary | ICD-10-CM | POA: Diagnosis not present

## 2021-11-15 DIAGNOSIS — Z1283 Encounter for screening for malignant neoplasm of skin: Secondary | ICD-10-CM | POA: Diagnosis not present

## 2021-11-15 DIAGNOSIS — D225 Melanocytic nevi of trunk: Secondary | ICD-10-CM | POA: Diagnosis not present

## 2021-11-15 DIAGNOSIS — Z08 Encounter for follow-up examination after completed treatment for malignant neoplasm: Secondary | ICD-10-CM | POA: Diagnosis not present

## 2021-11-19 ENCOUNTER — Encounter: Payer: Self-pay | Admitting: Family Medicine

## 2021-11-19 ENCOUNTER — Ambulatory Visit (INDEPENDENT_AMBULATORY_CARE_PROVIDER_SITE_OTHER): Payer: Medicare Other | Admitting: Family Medicine

## 2021-11-19 VITALS — BP 120/70 | HR 54 | Temp 97.5°F | Ht 66.75 in | Wt 205.0 lb

## 2021-11-19 DIAGNOSIS — R0609 Other forms of dyspnea: Secondary | ICD-10-CM | POA: Diagnosis not present

## 2021-11-19 DIAGNOSIS — G8929 Other chronic pain: Secondary | ICD-10-CM | POA: Diagnosis not present

## 2021-11-19 DIAGNOSIS — R739 Hyperglycemia, unspecified: Secondary | ICD-10-CM | POA: Diagnosis not present

## 2021-11-19 DIAGNOSIS — I10 Essential (primary) hypertension: Secondary | ICD-10-CM

## 2021-11-19 DIAGNOSIS — C61 Malignant neoplasm of prostate: Secondary | ICD-10-CM | POA: Diagnosis not present

## 2021-11-19 DIAGNOSIS — Z23 Encounter for immunization: Secondary | ICD-10-CM | POA: Diagnosis not present

## 2021-11-19 DIAGNOSIS — J342 Deviated nasal septum: Secondary | ICD-10-CM

## 2021-11-19 DIAGNOSIS — N1831 Chronic kidney disease, stage 3a: Secondary | ICD-10-CM | POA: Diagnosis not present

## 2021-11-19 DIAGNOSIS — M159 Polyosteoarthritis, unspecified: Secondary | ICD-10-CM

## 2021-11-19 DIAGNOSIS — E785 Hyperlipidemia, unspecified: Secondary | ICD-10-CM | POA: Diagnosis not present

## 2021-11-19 DIAGNOSIS — M25561 Pain in right knee: Secondary | ICD-10-CM | POA: Diagnosis not present

## 2021-11-19 DIAGNOSIS — I2510 Atherosclerotic heart disease of native coronary artery without angina pectoris: Secondary | ICD-10-CM

## 2021-11-19 DIAGNOSIS — M150 Primary generalized (osteo)arthritis: Secondary | ICD-10-CM

## 2021-11-19 LAB — LIPID PANEL
Cholesterol: 145 mg/dL (ref 0–200)
HDL: 46.7 mg/dL (ref >39.00)
LDL Cholesterol: 67 mg/dL (ref 0–99)
NonHDL: 97.82
Total CHOL/HDL Ratio: 3
Triglycerides: 153 mg/dL — ABNORMAL HIGH (ref 0.0–149.0)
VLDL: 30.6 mg/dL (ref 0.0–40.0)

## 2021-11-19 LAB — CBC WITH DIFFERENTIAL/PLATELET
Basophils Absolute: 0.1 10*3/uL (ref 0.0–0.1)
Basophils Relative: 1.3 % (ref 0.0–3.0)
Eosinophils Absolute: 0.2 10*3/uL (ref 0.0–0.7)
Eosinophils Relative: 2.6 % (ref 0.0–5.0)
HCT: 42.6 % (ref 39.0–52.0)
Hemoglobin: 14.2 g/dL (ref 13.0–17.0)
Lymphocytes Relative: 23.4 % (ref 12.0–46.0)
Lymphs Abs: 1.7 10*3/uL (ref 0.7–4.0)
MCHC: 33.4 g/dL (ref 30.0–36.0)
MCV: 90 fl (ref 78.0–100.0)
Monocytes Absolute: 0.7 10*3/uL (ref 0.1–1.0)
Monocytes Relative: 9.8 % (ref 3.0–12.0)
Neutro Abs: 4.6 10*3/uL (ref 1.4–7.7)
Neutrophils Relative %: 62.9 % (ref 43.0–77.0)
Platelets: 196 10*3/uL (ref 150.0–400.0)
RBC: 4.73 Mil/uL (ref 4.22–5.81)
RDW: 14.2 % (ref 11.5–15.5)
WBC: 7.4 10*3/uL (ref 4.0–10.5)

## 2021-11-19 LAB — HEPATIC FUNCTION PANEL
ALT: 21 U/L (ref 0–53)
AST: 22 U/L (ref 0–37)
Albumin: 4.4 g/dL (ref 3.5–5.2)
Alkaline Phosphatase: 41 U/L (ref 39–117)
Bilirubin, Direct: 0.1 mg/dL (ref 0.0–0.3)
Total Bilirubin: 0.8 mg/dL (ref 0.2–1.2)
Total Protein: 7.2 g/dL (ref 6.0–8.3)

## 2021-11-19 LAB — BASIC METABOLIC PANEL
BUN: 25 mg/dL — ABNORMAL HIGH (ref 6–23)
CO2: 23 mEq/L (ref 19–32)
Calcium: 9.2 mg/dL (ref 8.4–10.5)
Chloride: 107 mEq/L (ref 96–112)
Creatinine, Ser: 1.66 mg/dL — ABNORMAL HIGH (ref 0.40–1.50)
GFR: 40.49 mL/min — ABNORMAL LOW (ref >60.00)
Glucose, Bld: 124 mg/dL — ABNORMAL HIGH (ref 70–99)
Potassium: 4.2 mEq/L (ref 3.5–5.1)
Sodium: 140 mEq/L (ref 135–145)

## 2021-11-19 LAB — TSH: TSH: 3.22 u[IU]/mL (ref 0.35–5.50)

## 2021-11-19 LAB — HEMOGLOBIN A1C: Hgb A1c MFr Bld: 6.8 % — ABNORMAL HIGH (ref 4.6–6.5)

## 2021-11-19 NOTE — Progress Notes (Signed)
Subjective:    Patient ID: [name] [name]., male    DOB: [birth year], 76 y.o.   MRN: [id number]  HPI Here to follow up on issues. He has 2 things to discuss. First he continues to have some SOB when he exercises. This started years ago. He has seen Cardiology and had a full workup, and currently he has no indication this is due to ischemia. He has seen Pulmonology, and all their testing has been normal.  He is not anemic, so no explanation for this SOB has been found. Of note he had his nose broken several times when he was younger. The other issue is some mild swelling around his ankles. This appeared about a month ago. This is not painful for him. He has been monitoring his BP closely at home lately and he has been on the low side. His readings average 100-120 over 60-70. He still sees [name] every 6 months for PSA checks, and his last level was 2.49. This has been very slowly trending upward the past 2 years. He sees [name] several times a year, and he sees Nephrology yearly. His OA has been stable.    Review of Systems  Constitutional: Negative.   HENT: Negative.    Eyes: Negative.   Respiratory:  Positive for shortness of breath. Negative for cough, chest tightness and wheezing.   Cardiovascular:  Positive for leg swelling. Negative for chest pain and palpitations.  Gastrointestinal: Negative.   Genitourinary: Negative.   Musculoskeletal:  Positive for arthralgias.  Skin: Negative.   Neurological: Negative.   Psychiatric/Behavioral: Negative.         Objective:   Physical Exam Constitutional:      General: He is not in acute distress.    Appearance: Normal appearance. He is well-developed. He is not diaphoretic.  HENT:     Head: Normocephalic and atraumatic.     Right Ear: External ear normal.     Left Ear: External ear normal.     Nose: Nose normal.     Comments: He has a significant septal deviation to the left     Mouth/Throat:     Pharynx: No oropharyngeal  exudate.  Eyes:     General: No scleral icterus.       Right eye: No discharge.        Left eye: No discharge.     Conjunctiva/sclera: Conjunctivae normal.     Pupils: Pupils are equal, round, and reactive to light.  Neck:     Thyroid: No thyromegaly.     Vascular: No JVD.     Trachea: No tracheal deviation.  Cardiovascular:     Rate and Rhythm: Normal rate and regular rhythm.     Heart sounds: Normal heart sounds. No murmur heard.    No friction rub. No gallop.  Pulmonary:     Effort: Pulmonary effort is normal. No respiratory distress.     Breath sounds: Normal breath sounds. No wheezing or rales.  Chest:     Chest wall: No tenderness.  Abdominal:     General: Bowel sounds are normal. There is no distension.     Palpations: Abdomen is soft. There is no mass.     Tenderness: There is no abdominal tenderness. There is no guarding or rebound.  Genitourinary:    Penis: Normal. No tenderness.      Testes: Normal.     Prostate: Normal.     Rectum: Normal. Guaiac result negative.  Musculoskeletal:  General: No tenderness. Normal range of motion.     Cervical back: Neck supple.     Comments: 1+ edema in both ankles   Lymphadenopathy:     Cervical: No cervical adenopathy.  Skin:    General: Skin is warm and dry.     Coloration: Skin is not pale.     Findings: No erythema or rash.  Neurological:     Mental Status: He is alert and oriented to person, place, and time.     Cranial Nerves: No cranial nerve deficit.     Motor: No abnormal muscle tone.     Coordination: Coordination normal.     Deep Tendon Reflexes: Reflexes are normal and symmetric. Reflexes normal.  Psychiatric:        Behavior: Behavior normal.        Thought Content: Thought content normal.        Judgment: Judgment normal.           Assessment & Plan:  His CAD and CKD are stable. His OA is stable. He has SOB in exertion and I suspect this is because his nasal breathing is severely restricted by  his deviated septum. We will refer him to ENT to evaluate this. His ankle swelling is likely a side effect of the Amlodipine, and since his BP is on the low side we will decrease this from 10 mg to 5 mg daily. We will get fasting labs today to check lipids, etc. We spent a total of ( 35  ) minutes reviewing records and discussing these issues.  Alysia Penna, MD

## 2021-12-06 DIAGNOSIS — Z23 Encounter for immunization: Secondary | ICD-10-CM | POA: Diagnosis not present

## 2021-12-28 DIAGNOSIS — J386 Stenosis of larynx: Secondary | ICD-10-CM | POA: Diagnosis not present

## 2021-12-28 DIAGNOSIS — K219 Gastro-esophageal reflux disease without esophagitis: Secondary | ICD-10-CM | POA: Diagnosis not present

## 2021-12-28 DIAGNOSIS — J383 Other diseases of vocal cords: Secondary | ICD-10-CM | POA: Diagnosis not present

## 2021-12-28 DIAGNOSIS — R0602 Shortness of breath: Secondary | ICD-10-CM | POA: Diagnosis not present

## 2022-01-04 ENCOUNTER — Telehealth: Payer: Self-pay | Admitting: Pharmacist

## 2022-01-04 NOTE — Chronic Care Management (AMB) (Signed)
    Chronic Care Management Pharmacy Assistant   Name: [name] [name].  MRN: [id number] DOB: Aug 05, 1945   01/04/22 APPOINTMENT REMINDER   Patient is checked in and aware to have all medications, supplements and any blood glucose and blood pressure readings available for review with [name], Pharm. D, for telephone visit on 01/04/22 at 11:30.    Care Gaps: COVID Booster - Overdue Diabetic Urine - Overdue Eye Exam - Overdue BP- 120/70 11/19/21 AWV- 07/15/21   Star Rating Drug: Telmisartan (Micardis) 80 mg - Last filled at [facility] mail order [facility]) Rosuvastatin (Crestor) 20 mg - Last filled at [facility] mail order      Medications: Outpatient Encounter Medications as of 01/04/2022  Medication Sig   amLODipine (NORVASC) 10 MG tablet Take 1 tablet (10 mg total) by mouth daily.   bimatoprost (LUMIGAN) 0.01 % SOLN Place 1 drop into both eyes at bedtime.   clopidogrel (PLAVIX) 75 MG tablet Take 1 tablet (75 mg total) by mouth daily with breakfast. (Patient taking differently: Take 75 mg by mouth daily.)   dorzolamide-timolol (COSOPT) 22.3-6.8 MG/ML ophthalmic solution Place 1 drop into both eyes 2 (two) times daily.    Ergocalciferol (VITAMIN D2) 50 MCG (2000 UT) TABS Take 2,000 Units by mouth daily.   famotidine (PEPCID) 20 MG tablet Take 20 mg by mouth at bedtime.    fenofibrate 160 MG tablet Take 160 mg by mouth at bedtime.    fluticasone (FLONASE) 50 MCG/ACT nasal spray Place 1 spray into both nostrils daily as needed for allergies.   isosorbide mononitrate (IMDUR) 30 MG 24 hr tablet Take 30 mg by mouth daily.   magnesium oxide (MAG-OX) 400 MG tablet Take 400 mg by mouth every evening.    nitroGLYCERIN (NITROSTAT) 0.4 MG SL tablet Place 0.4 mg under the tongue every 5 (five) minutes as needed for chest pain.   pantoprazole (PROTONIX) 40 MG tablet Take 1 tablet (40 mg total) by mouth daily.   rosuvastatin (CRESTOR) 20 MG tablet Take 40 mg by mouth every evening.   telmisartan  (MICARDIS) 80 MG tablet Take 1 tablet (80 mg total) by mouth daily.   No facility-administered encounter medications on file as of 01/04/2022.     [place] Clinical Pharmacist Assistant [phone]

## 2022-01-05 ENCOUNTER — Ambulatory Visit (INDEPENDENT_AMBULATORY_CARE_PROVIDER_SITE_OTHER): Payer: Medicare Other | Admitting: Pharmacist

## 2022-01-05 DIAGNOSIS — E785 Hyperlipidemia, unspecified: Secondary | ICD-10-CM

## 2022-01-05 DIAGNOSIS — I10 Essential (primary) hypertension: Secondary | ICD-10-CM

## 2022-01-05 NOTE — Patient Instructions (Signed)
Hi [name] [name],  It was great to catch up again! I figured I would put this blurb on the after visit summary in case it is not easy to find in my note:  [name] [name] PCP recommended decreasing amlodipine from 10 mg to 5 mg due to fluid retention around his ankles and had BP on the lower end.  [name] [name] has noticed a lot less swelling around his ankles and his BP hasn't been high (usually 120s or less for SBP and < 70 for DBP). He does also note that he has not been able to crush the 10 mg amlodipine tablets very well and when he does he only gets a viable half tablet and has to toss out the rest as the other half crumbles when cut. He is hoping the [place] will be able to give him 5 mg tablets to help with this.  Hope this helps!  Please reach out to me if you have any questions or need anything before our follow up!  Best, Maddie  [name], PharmD, [place] Pharmacist [facility]  Visit Information   Goals Addressed   None    Patient Care Plan: CCM Pharmacy Care Plan     Problem Identified: Problem: Hypertension, Hyperlipidemia, Diabetes, Chronic Kidney Disease, and Osteoarthritis      Long-Range Goal: Patient-Specific Goal   Start Date: 01/08/2021  Expected End Date: 01/08/2022  Recent Progress: On track  Priority: High  Note:   Current Barriers:  Unable to independently monitor therapeutic efficacy  Pharmacist Clinical Goal(s):  Patient will achieve adherence to monitoring guidelines and medication adherence to achieve therapeutic efficacy maintain control of cholesterol as evidenced by next lipid panel  through collaboration with PharmD and provider.   Interventions: 1:1 collaboration with [name], MD regarding development and update of comprehensive plan of care as evidenced by provider attestation and co-signature Inter-disciplinary care team collaboration (see longitudinal plan of care) Comprehensive medication review  performed; medication list updated in electronic medical record  Hypertension (BP goal <130/80) -Controlled -Current treatment: Amlodipine 10 mg 1/2 tablet daily - Appropriate, Effective, Safe, Accessible Telmisartan 80 mg 1 tablet daily - Appropriate, Effective, Safe, Accessible -Medications previously tried: lisinopril (cough)  -Current home readings: 121/59, 116/65, 128/65; resting HR is 51 but denies effects of this (checking every day) -Current dietary habits: limits salt intake -Current exercise habits: swimming almost daily -Denies hypotensive/hypertensive symptoms -Educated on BP goals and benefits of medications for prevention of heart attack, stroke and kidney damage; Exercise goal of 150 minutes per week; Importance of home blood pressure monitoring; Proper BP monitoring technique; -Counseled to monitor BP at home weekly, document, and provide log at future appointments -Counseled on diet and exercise extensively Recommended to continue current medication  Hyperlipidemia: (LDL goal < 70) -Controlled -Current treatment: Fenofibrate 160 mg 1 tablet at bedtime - Appropriate, Query effective, Safe, Accessible Rosuvastatin 40 mg 1 tablet daily - Appropriate, Effective, Safe, Accessible -Medications previously tried: none  -Current dietary patterns: has not been paying as close attention to diet lately -Current exercise habits: swimming almost daily -Educated on Cholesterol goals;  Benefits of statin for ASCVD risk reduction; Importance of limiting foods high in cholesterol; -Counseled on diet and exercise extensively Recommended to continue current medication  Coronary atherosclerosis (Goal: prevent heart events) -Controlled -Current treatment  Nitroglycerin 0.4 mg 1 tablet as needed - Appropriate, Effective, Safe, Accessible Clopidogrel 75 mg 1 tablet daily - Appropriate, Effective, Safe, Accessible Isosorbide mononitrate 30 mg 1  tablet daily - Appropriate, Effective,  Safe, Accessible -Medications previously tried: none  -Recommended to continue current medication   Diabetes (A1c goal <7%) -Controlled -Current medications: No medications -Medications previously tried: none  -Current home glucose readings fasting glucose: does not check post prandial glucose: does not check -Denies hypoglycemic/hyperglycemic symptoms -Current meal patterns:  breakfast: did not discuss  lunch: did not discuss   dinner: did not discuss  snacks: did not discuss  drinks: did not discuss  -Current exercise: swimming almost daily -Educated on A1c and blood sugar goals; Carbohydrate counting and/or plate method -Counseled to check feet daily and get yearly eye exams -Counseled on diet and exercise extensively Mailed healthy plate handout. Patient is going to work on limiting carb intake.  GERD (Goal: minimize symptoms) -Controlled -Current treatment  Pantoprazole 40 mg 1 tablet daily - Appropriate, Effective, Safe, Accessible Famotidine 20 mg 1 tablet at bedtime - Appropriate, Effective, Safe, Accessible -Medications previously tried: none  -Recommended to continue current medication  Glaucoma (Goal: lower intraocular pressure) -Controlled -Current treatment  Cosopt 1 drop in both eyes twice daily - Appropriate, Effective, Safe, Accessible Lumigan 0.01%  1 drop in both eyes at bedtime - Appropriate, Effective, Safe, Accessible -Medications previously tried: none  -Recommended to continue current medication   Health Maintenance -Vaccine gaps: none -Current therapy:  Vitamin D 2000 units daily Flonase 50 mcg/act as needed Magnesium oxide 400 mg 1 tablet daily -Educated on Cost vs benefit of each product must be carefully weighed by individual consumer -Patient is satisfied with current therapy and denies issues -Recommended to continue current medication  Patient Goals/Self-Care Activities Patient will:  - take medications as prescribed as evidenced  by patient report and record review check blood pressure weekly, document, and provide at future appointments target a minimum of 150 minutes of moderate intensity exercise weekly engage in dietary modifications by lowering carb intake  Follow Up Plan: Telephone follow up appointment with care management team member scheduled for: 1 year        Patient verbalizes understanding of instructions and care plan provided today and agrees to view in [place]. Active MyChart status and patient understanding of how to access instructions and care plan via MyChart confirmed with patient.    Telephone follow up appointment with pharmacy team member scheduled for: 1 year  Viona Gilmore, [facility]

## 2022-01-05 NOTE — Progress Notes (Signed)
Chronic Care Management Pharmacy Note  01/05/2022 Name:  [name] [name]. MRN:  [id number] DOB:  Jun 19, 1945  Summary: LDL at goal < 70 BP at goal < 130/80 A1c increased at last visit  Recommendations/Changes made from today's visit: -Recommend adding diabetes diagnosis to problem list -Recommended repeat urine microalbumin next nephrology visit  Plan: BP assessment in 6 months Follow up in 1 year  Subjective: [name] [name]. is an 76 y.o. year old male who is a primary patient of [name], MD.  The CCM team was consulted for assistance with disease management and care coordination needs.    Engaged with patient by telephone for follow up visit in response to provider referral for pharmacy case management and/or care coordination services.   Consent to Services:  The patient was given information about Chronic Care Management services, agreed to services, and gave verbal consent prior to initiation of services.  Please see initial visit note for detailed documentation.   Patient Care Team: [name], MD as PCP - General Josue Hector, MD as PCP - Cardiology (Cardiology) Irine Seal, MD as Attending Physician (Urology) [facility] Milus Banister, MD as Attending Physician (Gastroenterology) Helayne Seminole, MD as Consulting Physician (Otolaryngology) Viona Gilmore, [facility] as Pharmacist (Pharmacist)  Recent office visits: 11/19/21 Alysia Penna, MD: Patient presented for SOB and swelling around ankles. Decreased amlodipine from 10 mg to 5 mg due to swelling and lower BP. A1c increased to 6.8%.  Referred to ENT. Influenza vaccine administered.  07/15/21 [name], LPN - Patient presented or Medicare Annual Wellness exam. Patient voiced goal of increased physical activity.  Recent consult visits: 12/28/21 Lind Guest, MD (ENT): Patient presented for initial consult for deviated septum.  Follow up in 2 months. Recommended  voice therapy but patient declined.  07/08/21 [name] (Orthopaedic Surg) - Patient presented for History of left total knee replacement. No other visit details available.  Hospital visits: None in previous 6 months  Objective:  Lab Results  Component Value Date   CREATININE 1.66 (H) 11/19/2021   BUN 25 (H) 11/19/2021   GFR 40.49 (L) 11/19/2021   GFRNONAA 47 (L) 07/11/2020   GFRAA 57 (L) 07/23/2019   NA 140 11/19/2021   K 4.2 11/19/2021   CALCIUM 9.2 11/19/2021   CO2 23 11/19/2021   GLUCOSE 124 (H) 11/19/2021    Lab Results  Component Value Date/Time   HGBA1C 6.8 (H) 11/19/2021 09:50 AM   HGBA1C 6.4 05/18/2021 10:43 AM   GFR 40.49 (L) 11/19/2021 09:50 AM   GFR 36.04 (L) 11/16/2020 11:36 AM    Last diabetic Eye exam: No results found for: "HMDIABEYEEXA"  Last diabetic Foot exam: No results found for: "HMDIABFOOTEX"   Lab Results  Component Value Date   CHOL 145 11/19/2021   HDL 46.70 11/19/2021   LDLCALC 67 11/19/2021   LDLDIRECT 82.1 04/07/2011   TRIG 153.0 (H) 11/19/2021   CHOLHDL 3 11/19/2021       Latest Ref Rng & Units 11/19/2021    9:50 AM 05/18/2021   10:43 AM 11/16/2020   11:36 AM  Hepatic Function  Total Protein 6.0 - 8.3 g/dL 7.2  7.7  6.8   Albumin 3.5 - 5.2 g/dL 4.4  5.1  4.6   AST 0 - 37 U/L _0 ALT 0 - 53 U/L _1 Alk Phosphatase 39 - 117 U/L 41  47  46   Total Bilirubin 0.2 - 1.2 mg/dL 0.8  0.7  0.7   Bilirubin, Direct 0.0 - 0.3 mg/dL 0.1  0.1  0.1     Lab Results  Component Value Date/Time   TSH 3.22 11/19/2021 09:50 AM   TSH 3.11 11/16/2020 11:36 AM       Latest Ref Rng & Units 11/19/2021    9:50 AM 11/16/2020   11:36 AM 07/11/2020    3:02 AM  CBC  WBC 4.0 - 10.5 K/uL 7.4  7.8  14.6   Hemoglobin 13.0 - 17.0 g/dL 14.2  13.9  12.6   Hematocrit 39.0 - 52.0 % 42.6  41.3  38.1   Platelets 150.0 - 400.0 K/uL 196.0  217.0  208     Lab Results  Component Value Date/Time   VD25OH 23.0 (L) 01/17/2019 10:54 AM     Clinical ASCVD: Yes  The 10-year ASCVD risk score (Arnett DK, et al., 2019) is: 60.4%   Values used to calculate the score:     Age: 18 years     Sex: Male     Is Non-Hispanic African American: No     Diabetic: Yes     Tobacco smoker: No     Systolic Blood Pressure: 540 mmHg     Is BP treated: Yes     HDL Cholesterol: 46.7 mg/dL     Total Cholesterol: 145 mg/dL       11/19/2021    9:03 AM 07/15/2021   10:31 AM 05/18/2021   10:01 AM  Depression screen PHQ 2/9  Decreased Interest 0 0 0  Down, Depressed, Hopeless 0 0 0  PHQ - 2 Score 0 0 0  Altered sleeping 0 0 0  Tired, decreased energy 0 0 0  Change in appetite 0 0 0  Feeling bad or failure about yourself  0 0 0  Trouble concentrating 0 0 0  Moving slowly or fidgety/restless 0 0 0  Suicidal thoughts 0 0 0  PHQ-9 Score 0 0 0  Difficult doing work/chores Not difficult at all        Social History   Tobacco Use  Smoking Status Former   Packs/day: 1.00   Years: 9.00   Total pack years: 9.00   Types: Cigarettes   Start date: 03/08/1963   Quit date: 03/07/1972   Years since quitting: 49.8  Smokeless Tobacco Never  Tobacco Comments   States AAA completed    BP Readings from Last 3 Encounters:  11/19/21 120/70  05/18/21 124/78  04/30/21 110/80   Pulse Readings from Last 3 Encounters:  11/19/21 (!) 54  05/18/21 90  04/30/21 (!) 50   Wt Readings from Last 3 Encounters:  11/19/21 205 lb (93 kg)  07/15/21 197 lb (89.4 kg)  05/18/21 197 lb 4 oz (89.5 kg)   BMI Readings from Last 3 Encounters:  11/19/21 32.35 kg/m  07/15/21 31.80 kg/m  05/18/21 31.84 kg/m    Assessment/Interventions: Review of patient past medical history, allergies, medications, health status, including review of consultants reports, laboratory and other test data, was performed as part of comprehensive evaluation and provision of chronic care management services.   SDOH:  (Social Determinants of Health) assessments and interventions  performed: Yes  SDOH Interventions    Flowsheet Row Chronic Care Management from 01/05/2022 in [place] [facility] [place] from 07/15/2021 in [place] [facility] [place] from 06/30/2020 in [place] [facility] [place] Management from 12/19/2019 in [place]  [facility] [place] Management from 09/17/2019 in [place] [facility] [place] from 05/11/2018 in [place] [facility] [place] Interventions -- Intervention Not Indicated Intervention Not Indicated -- -- --  Housing Interventions -- Intervention Not Indicated Intervention Not Indicated -- -- --  Transportation Interventions -- Intervention Not Indicated Intervention Not Indicated Intervention Not Indicated Intervention Not Indicated --  Depression Interventions/Treatment  -- -- -- -- -- PHQ2-9 Score <4 Follow-up Not Indicated  Financial Strain Interventions Intervention Not Indicated Intervention Not Indicated -- Intervention Not Indicated Intervention Not Indicated  [[facility] --  Physical Activity Interventions -- Intervention Not Indicated -- -- -- --  Stress Interventions -- Intervention Not Indicated Intervention Not Indicated -- -- --  Social Connections Interventions -- Intervention Not Indicated Intervention Not Indicated -- -- --      SDOH Screenings   Food Insecurity: No Food Insecurity (07/15/2021)  Housing: Low Risk  (07/15/2021)  Transportation Needs: No Transportation Needs (07/15/2021)  Alcohol Screen: Low Risk  (07/15/2021)  Depression (PHQ2-9): Low Risk  (11/19/2021)  Financial Resource Strain: Low Risk  (01/05/2022)  Physical Activity: Sufficiently Active (07/15/2021)  Social Connections: Socially Isolated (07/15/2021)  Stress: No Stress Concern Present (07/15/2021)  Tobacco Use: Medium Risk (11/19/2021)    [place]  No Known  Allergies  Medications Reviewed Today     Reviewed by Viona Gilmore, [facility] (Pharmacist) on 01/05/22 at 1152  Med List Status: <None>   Medication Order Taking? Sig Documenting Provider Last Dose Status Informant  amLODipine (NORVASC) 5 MG tablet [id number] Yes Take 5 mg by mouth daily. [provider] Taking Active   bimatoprost (LUMIGAN) 0.01 % SOLN [id number]  Place 1 drop into both eyes at bedtime. [provider]  Active Self  clopidogrel (PLAVIX) 75 MG tablet [id number]  Take 1 tablet (75 mg total) by mouth daily with breakfast.  Patient taking differently: Take 75 mg by mouth daily.   Kathyrn Drown D, NP  Active   dorzolamide-timolol (COSOPT) 22.3-6.8 MG/ML ophthalmic solution [id number]  Place 1 drop into both eyes 2 (two) times daily.  [provider]  Active Self  Ergocalciferol (VITAMIN D2) 50 MCG (2000 UT) TABS [id number]  Take 2,000 Units by mouth daily. [provider]  Active Self  famotidine (PEPCID) 20 MG tablet [id number]  Take 20 mg by mouth at bedtime.  [provider]  Active Self  fenofibrate 160 MG tablet [id number]  Take 160 mg by mouth at bedtime.  [provider]  Active Self  fluticasone (FLONASE) 50 MCG/ACT nasal spray [id number]  Place 1 spray into both nostrils daily as needed for allergies. [provider]  Active Self  isosorbide mononitrate (IMDUR) 30 MG 24 hr tablet [id number]  Take 30 mg by mouth daily. [provider]  Active Self  magnesium oxide (MAG-OX) 400 MG tablet [id number]  Take 400 mg by mouth every evening.  [provider]  Active Self  nitroGLYCERIN (NITROSTAT) 0.4 MG SL tablet [id number]  Place 0.4 mg under the tongue every 5 (five) minutes as needed for chest pain. [provider]  Active Self  pantoprazole (PROTONIX) 40 MG tablet [id number]  Take 1 tablet (40 mg total) by mouth daily. Burtis Junes, NP  Active Self  rosuvastatin (CRESTOR) 40 MG tablet [id number] Yes  Take 40 mg by mouth daily. [provider] Taking Active   telmisartan (MICARDIS) 80 MG  tablet [id number] Yes Take 1 tablet (80 mg total) by mouth daily. Tanda Rockers, MD Taking Active Self            Patient Active Problem List   Diagnosis Date Noted   Deviated nasal septum 11/19/2021   H/O total knee replacement, left 07/10/2020   Hyperglycemia 05/13/2020   Status post total knee replacement, right 07/22/2019   Chronic pain of right knee 05/29/2019   Numbness and tingling 05/29/2019   CKD (chronic kidney disease) stage 3, GFR 30-59 ml/min ([place]) 05/20/2019   Paresthesia 01/17/2019   Upper airway cough syndrome 09/19/2018   DOE (dyspnea on exertion) 08/08/2018   Abnormal cardiac CT angiography 01/17/2018   Angina, class II ([place]) 01/17/2018   Incisional hernia 08/11/2011   PROSTATE CANCER 10/05/2009   Coronary atherosclerosis 10/05/2009   ABNORMAL EKG 10/07/2008   Essential hypertension 04/13/2007   DIVERTICULOSIS, COLON 04/13/2007   Osteoarthritis 04/13/2007   Hyperlipidemia with target LDL less than 70 09/26/2006    Immunization History  Administered Date(s) Administered   Fluad Quad(high Dose 65+) 11/13/2018, 11/14/2019, 11/16/2020, 11/19/2021   Influenza Split 04/14/2011   Influenza Whole 02/18/2009   Influenza, High Dose Seasonal PF 01/01/2013, 11/06/2014, 11/06/2015, 11/08/2016, 11/07/2017   Influenza,inj,Quad PF,6+ Mos 10/29/2013   Moderna Covid-19 Vaccine Bivalent Booster 51yr & up 12/06/2021   Moderna Sars-Covid-2 Vaccination 04/05/2019, 05/03/2019   Pneumococcal Conjugate-13 06/26/2013   Pneumococcal Polysaccharide-23 05/05/2015   Td 05/10/2006   Tdap 02/08/2018   Zoster Recombinat (Shingrix) 08/27/2019, 10/30/2019   Zoster, Live 06/13/2011     [facility]in? [facility]  -[facility]?  -fix amlodipine and rosuvastatin on med list  [place]. [place] and just went with  his wife and daughter.   Patient reports Dr. FSarajane Jewsdecreased his amlodipine to 552mdaily and this seems to be a lot better with the lower dose. He was having fluid retention around his ankles and had BP on the lower end which is why his PCP recommended to cut back on the dose. Patient has noticed a lot less swelling and his BP hasn't been high. He does also note that he has not been able to crush the 10 mg amlodipine tablets very well and when he does he only gets a viable half tablet and has to toss out the rest as the other half crumbles when cut. [place] 5 mg tablets to help with this.  Patient reports he does an eye exam every 6 months. He will request a report to be sent to our office to update our records.  Patient is using Flonase every night and want to know if he can continue using this regularly. Recommended stopping  only if he is having nosebleeds.  Conditions to be addressed/monitored:  Hypertension, Hyperlipidemia, Diabetes, Chronic Kidney Disease, and Osteoarthritis  Conditions addressed this visit: Hypertension, hyperlipidemia, diabetes  Care Plan : [email or web address] made by PrViona GilmoreRPSouth [place]ince 01/05/2022 12:00 AM     Problem: Problem: Hypertension, Hyperlipidemia, Diabetes, Chronic Kidney Disease, and Osteoarthritis      Long-Range Goal: Patient-Specific Goal   Start Date: 01/08/2021  Expected End Date: 01/08/2022  Recent Progress: On track  Priority: High  Note:   Current Barriers:  Unable to independently monitor therapeutic efficacy  Pharmacist Clinical Goal(s):  Patient will achieve adherence to monitoring guidelines and medication  adherence to achieve therapeutic efficacy maintain control of cholesterol as evidenced by next lipid panel  through collaboration with PharmD and provider.   Interventions: 1:1 collaboration with [name], MD regarding development and update of comprehensive plan of care as  evidenced by provider attestation and co-signature Inter-disciplinary care team collaboration (see longitudinal plan of care) Comprehensive medication review performed; medication list updated in electronic medical record  Hypertension (BP goal <130/80) -Controlled -Current treatment: Amlodipine 10 mg 1/2 tablet daily - Appropriate, Effective, Safe, Accessible Telmisartan 80 mg 1 tablet daily - Appropriate, Effective, Safe, Accessible -Medications previously tried: lisinopril (cough)  -Current home readings: 121/59, 116/65, 128/65; resting HR is 51 but denies effects of this (checking every day) -Current dietary habits: limits salt intake -Current exercise habits: swimming almost daily -Denies hypotensive/hypertensive symptoms -Educated on BP goals and benefits of medications for prevention of heart attack, stroke and kidney damage; Exercise goal of 150 minutes per week; Importance of home blood pressure monitoring; Proper BP monitoring technique; -Counseled to monitor BP at home weekly, document, and provide log at future appointments -Counseled on diet and exercise extensively Recommended to continue current medication  Hyperlipidemia: (LDL goal < 70) -Controlled -Current treatment: Fenofibrate 160 mg 1 tablet at bedtime - Appropriate, Query effective, Safe, Accessible Rosuvastatin 40 mg 1 tablet daily - Appropriate, Effective, Safe, Accessible -Medications previously tried: none  -Current dietary patterns: has not been paying as close attention to diet lately -Current exercise habits: swimming almost daily -Educated on Cholesterol goals;  Benefits of statin for ASCVD risk reduction; Importance of limiting foods high in cholesterol; -Counseled on diet and exercise extensively Recommended to continue current medication  Coronary atherosclerosis (Goal: prevent heart events) -Controlled -Current treatment  Nitroglycerin 0.4 mg 1 tablet as needed - Appropriate, Effective, Safe,  Accessible Clopidogrel 75 mg 1 tablet daily - Appropriate, Effective, Safe, Accessible Isosorbide mononitrate 30 mg 1 tablet daily - Appropriate, Effective, Safe, Accessible -Medications previously tried: none  -Recommended to continue current medication   Diabetes (A1c goal <7%) -Controlled -Current medications: No medications -Medications previously tried: none  -Current home glucose readings fasting glucose: does not check post prandial glucose: does not check -Denies hypoglycemic/hyperglycemic symptoms -Current meal patterns:  breakfast: did not discuss  lunch: did not discuss   dinner: did not discuss  snacks: did not discuss  drinks: did not discuss  -Current exercise: swimming almost daily -Educated on A1c and blood sugar goals; Carbohydrate counting and/or plate method -Counseled to check feet daily and get yearly eye exams -Counseled on diet and exercise extensively Mailed healthy plate handout. Patient is going to work on limiting carb intake.  GERD (Goal: minimize symptoms) -Controlled -Current treatment  Pantoprazole 40 mg 1 tablet daily - Appropriate, Effective, Safe, Accessible Famotidine 20 mg 1 tablet at bedtime - Appropriate, Effective, Safe, Accessible -Medications previously tried: none  -Recommended to continue current medication  Glaucoma (Goal: lower intraocular pressure) -Controlled -Current treatment  Cosopt 1 drop in both eyes twice daily - Appropriate, Effective, Safe, Accessible Lumigan 0.01%  1 drop in both eyes at bedtime - Appropriate, Effective, Safe, Accessible -Medications previously tried: none  -Recommended to continue current medication   Health Maintenance -Vaccine gaps: none -Current therapy:  Vitamin D 2000 units daily Flonase 50 mcg/act as needed Magnesium oxide 400 mg 1 tablet daily -Educated on Cost vs benefit of each product must be carefully weighed by individual consumer -Patient is satisfied with current therapy and  denies issues -Recommended to continue current medication  Patient  Goals/Self-Care Activities Patient will:  - take medications as prescribed as evidenced by patient report and record review check blood pressure weekly, document, and provide at future appointments target a minimum of 150 minutes of moderate intensity exercise weekly engage in dietary modifications by lowering carb intake  Follow Up Plan: Telephone follow up appointment with care management team member scheduled for: 1 year       Medication Assistance: None required.  Patient affirms current coverage meets needs.  Compliance/Adherence/Medication fill history: Care Gaps: Eye exam, urine microalbumin BP- 120/70 11/19/21 A1c: 6.8% (11/19/21)  Star-Rating Drugs: Telmisartan (Micardis) 80 mg - Last filled at [facility]) Rosuvastatin (Crestor) 20 mg - Last filled at [facility] order  Patient's preferred pharmacy is:  [facility] [id number] - [address], [place] [address] Phone: [phone] Fax: [phone]   Uses pill box? Yes Pt endorses 100% compliance  We discussed: Current pharmacy is preferred with insurance plan and patient is satisfied with pharmacy services Patient decided to: Continue current medication management strategy  Care Plan and Follow Up Patient Decision:  Patient agrees to Care Plan and Follow-up.  Plan: Telephone follow up appointment with care management team member scheduled for:  1 year  [name], PharmD, [place] Pharmacist [place] [facility] [place] [phone]

## 2022-01-07 ENCOUNTER — Ambulatory Visit (INDEPENDENT_AMBULATORY_CARE_PROVIDER_SITE_OTHER): Payer: Medicare Other | Admitting: Family Medicine

## 2022-01-07 ENCOUNTER — Encounter: Payer: Self-pay | Admitting: Family Medicine

## 2022-01-07 VITALS — BP 176/102 | HR 102 | Temp 98.2°F | Wt 204.0 lb

## 2022-01-07 DIAGNOSIS — I2510 Atherosclerotic heart disease of native coronary artery without angina pectoris: Secondary | ICD-10-CM | POA: Diagnosis not present

## 2022-01-07 DIAGNOSIS — N390 Urinary tract infection, site not specified: Secondary | ICD-10-CM

## 2022-01-07 DIAGNOSIS — R350 Frequency of micturition: Secondary | ICD-10-CM

## 2022-01-07 LAB — POC URINALSYSI DIPSTICK (AUTOMATED)
Bilirubin, UA: NEGATIVE
Blood, UA: POSITIVE
Glucose, UA: POSITIVE — AB
Ketones, UA: NEGATIVE
Leukocytes, UA: NEGATIVE
Nitrite, UA: NEGATIVE
Protein, UA: NEGATIVE
Spec Grav, UA: 1.025 (ref 1.010–1.025)
Urobilinogen, UA: 0.2 E.U./dL
pH, UA: 5.5 (ref 5.0–8.0)

## 2022-01-07 MED ORDER — CIPROFLOXACIN HCL 500 MG PO TABS: 500.0000 mg | ORAL_TABLET | Freq: Two times a day (BID) | ORAL | 0 refills | Status: AC

## 2022-01-07 NOTE — Progress Notes (Signed)
   Subjective:    Patient ID: [name] Zanetti., male    DOB: [birth year], 76 y.o.   MRN: [id number]  HPI Here for 4 weeks of increased frequency of urination and burning. No NVD.  He has some aching pains in the left lower back. No fever until this morning when it was 101 degrees. He took some Tylenol so this is down now. He drinks plenty of water. He had some Amoxicillin on hand for dental procedures so he began taking this. He has taken 6 days of this.    Review of Systems  Constitutional:  Positive for fever.  Respiratory: Negative.    Cardiovascular: Negative.   Gastrointestinal: Negative.   Genitourinary:  Positive for dysuria, frequency and urgency. Negative for hematuria.       Objective:   Physical Exam Constitutional:      Appearance: Normal appearance. He is not ill-appearing.  Cardiovascular:     Rate and Rhythm: Normal rate and regular rhythm.     Pulses: Normal pulses.     Heart sounds: Normal heart sounds.  Pulmonary:     Effort: Pulmonary effort is normal.     Breath sounds: Normal breath sounds.  Abdominal:     Tenderness: There is no right CVA tenderness or left CVA tenderness.  Neurological:     Mental Status: He is alert.           Assessment & Plan:  UTI. We will stop the Amoxicillin and he will take 10 days of Cipro. Culture the sample.  Alysia Penna, MD

## 2022-01-10 ENCOUNTER — Encounter (HOSPITAL_COMMUNITY): Payer: Self-pay

## 2022-01-10 ENCOUNTER — Other Ambulatory Visit: Payer: Self-pay

## 2022-01-10 ENCOUNTER — Emergency Department (HOSPITAL_COMMUNITY): Payer: Medicare Other

## 2022-01-10 ENCOUNTER — Emergency Department (HOSPITAL_COMMUNITY)
Admission: EM | Admit: 2022-01-10 | Discharge: 2022-01-11 | Disposition: A | Discharge status: Home / Self Care | Payer: Medicare Other | Attending: Emergency Medicine | Admitting: Emergency Medicine

## 2022-01-10 DIAGNOSIS — K5730 Diverticulosis of large intestine without perforation or abscess without bleeding: Secondary | ICD-10-CM | POA: Diagnosis not present

## 2022-01-10 DIAGNOSIS — N200 Calculus of kidney: Secondary | ICD-10-CM | POA: Diagnosis not present

## 2022-01-10 DIAGNOSIS — N189 Chronic kidney disease, unspecified: Secondary | ICD-10-CM | POA: Insufficient documentation

## 2022-01-10 DIAGNOSIS — K760 Fatty (change of) liver, not elsewhere classified: Secondary | ICD-10-CM | POA: Diagnosis not present

## 2022-01-10 DIAGNOSIS — N132 Hydronephrosis with renal and ureteral calculous obstruction: Secondary | ICD-10-CM | POA: Diagnosis not present

## 2022-01-10 DIAGNOSIS — R109 Unspecified abdominal pain: Secondary | ICD-10-CM | POA: Diagnosis present

## 2022-01-10 DIAGNOSIS — N281 Cyst of kidney, acquired: Secondary | ICD-10-CM | POA: Diagnosis not present

## 2022-01-10 DIAGNOSIS — Z87891 Personal history of nicotine dependence: Secondary | ICD-10-CM | POA: Diagnosis not present

## 2022-01-10 LAB — CBC WITH DIFFERENTIAL/PLATELET
Abs Immature Granulocytes: 0.05 10*3/uL (ref 0.00–0.07)
Basophils Absolute: 0.1 10*3/uL (ref 0.0–0.1)
Basophils Relative: 1 %
Eosinophils Absolute: 0.1 10*3/uL (ref 0.0–0.5)
Eosinophils Relative: 1 %
HCT: 43.2 % (ref 39.0–52.0)
Hemoglobin: 14 g/dL (ref 13.0–17.0)
Immature Granulocytes: 0 %
Lymphocytes Relative: 10 %
Lymphs Abs: 1.4 10*3/uL (ref 0.7–4.0)
MCH: 29.7 pg (ref 26.0–34.0)
MCHC: 32.4 g/dL (ref 30.0–36.0)
MCV: 91.5 fL (ref 80.0–100.0)
Monocytes Absolute: 1.1 10*3/uL — ABNORMAL HIGH (ref 0.1–1.0)
Monocytes Relative: 8 %
Neutro Abs: 11.1 10*3/uL — ABNORMAL HIGH (ref 1.7–7.7)
Neutrophils Relative %: 80 %
Platelets: 226 10*3/uL (ref 150–400)
RBC: 4.72 MIL/uL (ref 4.22–5.81)
RDW: 13.2 % (ref 11.5–15.5)
WBC: 13.8 10*3/uL — ABNORMAL HIGH (ref 4.0–10.5)
nRBC: 0 % (ref 0.0–0.2)

## 2022-01-10 LAB — COMPREHENSIVE METABOLIC PANEL
ALT: 20 U/L (ref 0–44)
AST: 25 U/L (ref 15–41)
Albumin: 4.1 g/dL (ref 3.5–5.0)
Alkaline Phosphatase: 38 U/L (ref 38–126)
Anion gap: 10 (ref 5–15)
BUN: 23 mg/dL (ref 8–23)
CO2: 20 mmol/L — ABNORMAL LOW (ref 22–32)
Calcium: 9.4 mg/dL (ref 8.9–10.3)
Chloride: 108 mmol/L (ref 98–111)
Creatinine, Ser: 2.06 mg/dL — ABNORMAL HIGH (ref 0.61–1.24)
GFR, Estimated: 33 mL/min — ABNORMAL LOW (ref >60)
Glucose, Bld: 156 mg/dL — ABNORMAL HIGH (ref 70–99)
Potassium: 3.5 mmol/L (ref 3.5–5.1)
Sodium: 138 mmol/L (ref 135–145)
Total Bilirubin: 0.7 mg/dL (ref 0.3–1.2)
Total Protein: 7.4 g/dL (ref 6.5–8.1)

## 2022-01-10 LAB — URINALYSIS, ROUTINE W REFLEX MICROSCOPIC
Bacteria, UA: NONE SEEN
Bilirubin Urine: NEGATIVE
Glucose, UA: NEGATIVE mg/dL
Ketones, ur: NEGATIVE mg/dL
Leukocytes,Ua: NEGATIVE
Nitrite: NEGATIVE
Protein, ur: 30 mg/dL — AB
RBC / HPF: >50 RBC/hpf — ABNORMAL HIGH (ref 0–5)
Specific Gravity, Urine: 1.015 (ref 1.005–1.030)
pH: 6 (ref 5.0–8.0)

## 2022-01-10 LAB — URINE CULTURE
MICRO NUMBER:: 14141452
SPECIMEN QUALITY:: ADEQUATE

## 2022-01-10 NOTE — ED Provider Triage Note (Signed)
Emergency Medicine Provider Triage Evaluation Note  [name] [name]. , a 76 y.o. male  was evaluated in triage.  Pt complains of left flank pain.  Has been treated for UTI with amoxicillin and ciprofloxacin.  Has associated nausea, vomiting, hematuria onset this morning.  History of kidney stones as well as prostate cancer.  Review of Systems  Positive:  Negative:   Physical Exam  BP 130/72   Pulse 65   Temp 97.8 F (36.6 C)   Resp 20   SpO2 100%  Gen:   Awake, no distress   Resp:  Normal effort  MSK:   Moves extremities without difficulty  Other:  No abdominal tenderness to palpation.  Left CVA tenderness to palpation  Medical Decision Making  Medically screening exam initiated at 5:24 PM.  Appropriate orders placed.  [name] was informed that the remainder of the evaluation will be completed by another provider, this initial triage assessment does not replace that evaluation, and the importance of remaining in the ED until their evaluation is complete.  Work-up initiated   [name] A, PA-C 01/10/22 1725

## 2022-01-10 NOTE — ED Triage Notes (Signed)
Pt reports lower back pain and currently being treated for UTI Amoxicillin for 6 days with no improvement and started on cipro on Friday.  Hematuria started today with n/v and chills.  Hx prostate cancer and kidney stones

## 2022-01-11 DIAGNOSIS — N3001 Acute cystitis with hematuria: Secondary | ICD-10-CM | POA: Diagnosis not present

## 2022-01-11 DIAGNOSIS — N170 Acute kidney failure with tubular necrosis: Secondary | ICD-10-CM | POA: Diagnosis not present

## 2022-01-11 DIAGNOSIS — N201 Calculus of ureter: Secondary | ICD-10-CM | POA: Diagnosis not present

## 2022-01-11 MED ORDER — OXYCODONE HCL 5 MG PO TABS: 5.0000 mg | ORAL_TABLET | ORAL | 0 refills | Status: DC | PRN

## 2022-01-11 MED ORDER — TAMSULOSIN HCL 0.4 MG PO CAPS: 0.4000 mg | ORAL_CAPSULE | Freq: Every day | ORAL | 0 refills | Status: DC

## 2022-01-11 NOTE — Discharge Instructions (Signed)
You were evaluated in the Emergency Department and after careful evaluation, we did not find any emergent condition requiring admission or further testing in the hospital.  Your exam/testing today is overall reassuring.  Your symptoms were likely due to a kidney stone.  Given that you have had no pain for several hours, it is likely that you either passed the stone already has moved to your bladder.  If you have any mild pain tomorrow you can use the Flomax daily.  Very important that you complete the ciprofloxacin antibiotic.  Please return to the Emergency Department if you experience any worsening of your condition such as fever or return of severe pain..   Thank you for allowing [place] to be a part of your care.

## 2022-01-11 NOTE — ED Provider Notes (Signed)
[facility] Emergency Department Provider Note MRN:  [id number]  Arrival date & time: 01/11/22     Chief Complaint   Back Pain   History of Present Illness   [name] Trimm. is a 76 y.o. year-old male with history of CKD presenting to the ED with chief complaint of back pain.  Severe left flank pain earlier this afternoon associated with nausea vomiting.  Also has been having recent hematuria, dysuria, was diagnosed with a UTI recently.  Amoxicillin was not working, switch to ciprofloxacin.  Had a fever a few days ago.  Review of Systems  A thorough review of systems was obtained and all systems are negative except as noted in the HPI and PMH.   Patient's Health History    Past Medical History:  Diagnosis Date   ABNORMAL EKG 10/07/2008   ADVEF, DRUG/MED/BIOL SUBST, OTHER DRUG NOS 09/26/2006   Agent orange exposure    Allergy    seasonal   Cataract    left eye-surgery 07-2015   DEGENERATIVE JOINT DISEASE, MODERATE 04/13/2007   DIVERTICULOSIS, COLON 04/13/2007   ELEVATED PROSTATE SPECIFIC ANTIGEN 04/14/2007   GERD (gastroesophageal reflux disease)    Glaucoma    sees [name]    History of kidney stones    1970    HYPERLIPIDEMIA 09/26/2006   HYPERTENSION 04/13/2007   JOINT EFFUSION, KNEE 06/15/2009   Melanoma ([place])    sees [name]    Numbness and tingling    Pre-diabetes    PROSTATE CANCER 10/05/2009   sees [name]   Renal insufficiency    stage III [facility]     Past Surgical History:  Procedure Laterality Date   ACROMIOPLASTY Right 05-06-14   per Dr. Macy Mis     bilateral   CATARACT EXTRACTION Left    per [name]    COLONOSCOPY  07/10/2015   per [name], benign polyp, repeat in 10 yrs    CORONARY STENT INTERVENTION N/A 01/17/2018   Procedure: CORONARY STENT INTERVENTION;  Surgeon: [name], MD;  Location: [place] [facility];  Service: Cardiovascular;  Laterality: N/A;   CYSTECTOMY     benign from hand    GLAUCOMA SURGERY Bilateral 2014    heart stent     INSERTION OF MESH N/A 01/10/2013   Procedure: INSERTION OF MESH;  Surgeon: Earnstine Regal, MD;  Location: [place];  Service: General;  Laterality: N/A;   KNEE ARTHROSCOPY Right Jan. 2013   right knee, per [name]    KNEE ARTHROSCOPY Left 01-14-15   per [name]    LEFT HEART CATH AND CORONARY ANGIOGRAPHY N/A 01/17/2018   Procedure: LEFT HEART CATH AND CORONARY ANGIOGRAPHY;  Surgeon: [name], MD;  Location: [place] [facility];  Service: Cardiovascular;  Laterality: N/A;   LEFT HEART CATH AND CORONARY ANGIOGRAPHY N/A 04/06/2018   Procedure: LEFT HEART CATH AND CORONARY ANGIOGRAPHY;  Surgeon: [name], MD;  Location: [place] [facility];  Service: Cardiovascular;  Laterality: N/A;   MELANOMA EXCISION  2012   per [name]   PROSTATECTOMY     per [name]   TONSILLECTOMY     TOTAL KNEE ARTHROPLASTY Right 07/22/2019   Procedure: TOTAL KNEE ARTHROPLASTY;  Surgeon: Netta Cedars, MD;  [facility];  Service: Orthopedics;  Laterality: Right;   TOTAL KNEE ARTHROPLASTY Left 07/10/2020   Procedure: TOTAL KNEE ARTHROPLASTY;  Surgeon: Netta Cedars, MD;  Location: [facility];  Service: Orthopedics;  Laterality: Left;   UMBILICAL HERNIA REPAIR  11/01/2011   Procedure: HERNIA REPAIR UMBILICAL ADULT;  Surgeon: Earnstine Regal, MD;  Location: [place];  Service: General;  Laterality: N/A;  Repair umbilical hernia with mesh patch   VASECTOMY     VENTRAL HERNIA REPAIR  01/10/2013   with mesh     [name] HERNIA REPAIR N/A 01/10/2013   Procedure: HERNIA REPAIR VENTRAL ADULT ;  Surgeon: Earnstine Regal, MD;  Location: [place];  Service: General;  Laterality: N/A;    Family History  Problem Relation Age of Onset   Stroke Mother    Diabetes Mother    Heart failure Father    Prostate cancer Father    Colon cancer Neg Hx    Colon polyps Neg Hx    Esophageal cancer Neg Hx    Rectal cancer Neg Hx    Stomach cancer  Neg Hx     Social History   Socioeconomic History   Marital status: Married    Spouse name: [name] [name]   Number of children: 2   Years of education: 12   Highest education level: 12th grade  Occupational History   Occupation: Administrator, sports    Comment: retired  Tobacco Use   Smoking status: Former    Packs/day: 1.00    Years: 9.00    Total pack years: 9.00    Types: Cigarettes    Start date: 03/08/1963    Quit date: 03/07/1972    Years since quitting: 49.8   Smokeless tobacco: Never   Tobacco comments:    States AAA completed   Vaping Use   Vaping Use: Never used  Substance and Sexual Activity   Alcohol use: Yes    Comment: occasional   Drug use: Never   Sexual activity: Not on file  Other Topics Concern   Not on file  Social History Narrative   Was in the [employer]; submarine and Scientist, clinical (histocompatibility and immunogenetics)   Currently can go to the [employer] orange exposure   Right-handed.   2-3 cups caffeine daily,      Wife; married 40 years   2 children; grands no       05/11/2018:    Lives with wife on one level home   Enjoys going swimming at gym 3 days/week; currently enjoying cardiac rehab   Has one son, one daughter, and one granddaughter who all live in [place]. Travels to see them every other week.      Social Determinants of Health   Financial Resource Strain: Low Risk  (01/06/2022)   Overall Financial Resource Strain (CARDIA)    Difficulty of Paying Living Expenses: Not hard at all  Food Insecurity: No Food Insecurity (01/06/2022)   Hunger Vital Sign    Worried About Running Out of Food in the Last Year: Never true    Ran Out of Food in the Last Year: Never true  Transportation Needs: No Transportation Needs (01/06/2022)   PRAPARE - Hydrologist (Medical): No    Lack of Transportation (Non-Medical): No  Physical Activity: Sufficiently Active (01/06/2022)   Exercise Vital Sign    Days of Exercise per Week: 3 days    Minutes of Exercise per Session: 50 min   Stress: No Stress Concern Present (01/06/2022)   [place]    Feeling of Stress : Not at all  Social Connections: Moderately Isolated (01/06/2022)   Social Connection and Isolation Panel [NHANES]    Frequency of Communication with Friends and Family: Once a week    Frequency of Social Gatherings with Friends and Family: Once a week    Attends Religious Services: 1 to 4 times per year    Active Member of [employer] or Organizations: No    Attends Archivist Meetings: Never    Marital Status: Married  Human resources officer Violence: Not At Risk (07/15/2021)   Humiliation, Afraid, Rape, and Kick questionnaire    Fear of Current or Ex-Partner: No    Emotionally Abused: No    Physically Abused: No    Sexually Abused: No     Physical Exam   Vitals:   01/10/22 2356 01/11/22 0015  BP:    Pulse:  63  Resp:    Temp: 98.5 F (36.9 C)   SpO2:  98%    CONSTITUTIONAL: Well-appearing, NAD NEURO/PSYCH:  Alert and oriented x 3, no focal deficits EYES:  eyes equal and reactive ENT/NECK:  no LAD, no JVD CARDIO: Regular rate, well-perfused, normal S1 and S2 PULM:  CTAB no wheezing or rhonchi GI/GU:  non-distended, non-tender MSK/SPINE:  No gross deformities, no edema SKIN:  no rash, atraumatic   *Additional and/or pertinent findings included in MDM below  Diagnostic and Interventional Summary    EKG Interpretation  Date/Time:    Ventricular Rate:    PR Interval:    QRS Duration:   QT Interval:    QTC Calculation:   R Axis:     Text Interpretation:         Labs Reviewed  URINALYSIS, ROUTINE W REFLEX MICROSCOPIC - Abnormal; Notable for the following components:      Result Value   Hgb urine dipstick LARGE (*)    Protein, ur 30 (*)    RBC / HPF >50 (*)    All other components within normal limits  COMPREHENSIVE METABOLIC PANEL - Abnormal; Notable for the following components:   CO2 20 (*)    Glucose, Bld  156 (*)    Creatinine, Ser 2.06 (*)    GFR, Estimated 33 (*)    All other components within normal limits  CBC WITH DIFFERENTIAL/PLATELET - Abnormal; Notable for the following components:   WBC 13.8 (*)    Neutro Abs 11.1 (*)    Monocytes Absolute 1.1 (*)    All other components within normal limits    CT Renal Stone Study  Final Result      Medications - No data to display   Procedures  /  Critical Care Procedures  ED Course and Medical Decision Making  Initial Impression and Ddx Patient explains that his symptoms resolved about 4 hours ago while in the waiting room.  Has no symptoms at this time, has not had any pain since.  Suspect kidney stone versus pyelogiven the history.  Past medical/surgical history that increases complexity of ED encounter: CKD, anticoagulated  Interpretation of Diagnostics I personally reviewed the laboratory assessment and my interpretation is as follows: Minimal creatinine elevation, otherwise no significant blood count or electrolyte disturbance  Urinalysis without signs of infection, CT revealing a UPJ stone, 3 mm.  Patient Reassessment and Ultimate Disposition/Management     Given that patient has been pain-free, suspect the stone has passed into the bladder or has been fully passed.  And so doubt that patient has a continued obstructive stone with infection though this is an obvious concern.  Strict  return precautions for any fever or return of pain.  Patient management required discussion with the following services or consulting groups:  None  Complexity of Problems Addressed Acute illness or injury that poses threat of life of bodily function  Additional Data Reviewed and Analyzed Further history obtained from: Further history from spouse/family member  Additional Factors Impacting ED Encounter Risk Prescriptions  Barth Kirks. [place], [place] [email or web address]  Final Clinical  Impressions(s) / ED Diagnoses     ICD-10-CM   1. Kidney stone  N20.0       ED Discharge Orders          Ordered    tamsulosin (FLOMAX) 0.4 MG CAPS capsule  Daily        01/11/22 0059    oxyCODONE (ROXICODONE) 5 MG immediate release tablet  Every 4 hours PRN        01/11/22 0059             Discharge Instructions Discussed with and Provided to Patient:    Discharge Instructions      You were evaluated in the Emergency Department and after careful evaluation, we did not find any emergent condition requiring admission or further testing in the hospital.  Your exam/testing today is overall reassuring.  Your symptoms were likely due to a kidney stone.  Given that you have had no pain for several hours, it is likely that you either passed the stone already has moved to your bladder.  If you have any mild pain tomorrow you can use the Flomax daily.  Very important that you complete the ciprofloxacin antibiotic.  Please return to the Emergency Department if you experience any worsening of your condition such as fever or return of severe pain..   Thank you for allowing [place] to be a part of your care.      Maudie Flakes, MD 01/11/22 [phone]

## 2022-01-25 DIAGNOSIS — N170 Acute kidney failure with tubular necrosis: Secondary | ICD-10-CM | POA: Diagnosis not present

## 2022-02-03 DIAGNOSIS — E785 Hyperlipidemia, unspecified: Secondary | ICD-10-CM

## 2022-02-03 DIAGNOSIS — I10 Essential (primary) hypertension: Secondary | ICD-10-CM

## 2022-02-22 DIAGNOSIS — H43813 Vitreous degeneration, bilateral: Secondary | ICD-10-CM | POA: Diagnosis not present

## 2022-02-22 DIAGNOSIS — H0102A Squamous blepharitis right eye, upper and lower eyelids: Secondary | ICD-10-CM | POA: Diagnosis not present

## 2022-02-22 DIAGNOSIS — H401131 Primary open-angle glaucoma, bilateral, mild stage: Secondary | ICD-10-CM | POA: Diagnosis not present

## 2022-02-22 DIAGNOSIS — H0102B Squamous blepharitis left eye, upper and lower eyelids: Secondary | ICD-10-CM | POA: Diagnosis not present

## 2022-02-22 DIAGNOSIS — Z961 Presence of intraocular lens: Secondary | ICD-10-CM | POA: Diagnosis not present

## 2022-02-23 DIAGNOSIS — R0602 Shortness of breath: Secondary | ICD-10-CM | POA: Diagnosis not present

## 2022-02-23 DIAGNOSIS — J383 Other diseases of vocal cords: Secondary | ICD-10-CM | POA: Diagnosis not present

## 2022-04-11 DIAGNOSIS — C61 Malignant neoplasm of prostate: Secondary | ICD-10-CM | POA: Diagnosis not present

## 2022-04-13 DIAGNOSIS — N393 Stress incontinence (female) (male): Secondary | ICD-10-CM | POA: Diagnosis not present

## 2022-04-13 DIAGNOSIS — N5231 Erectile dysfunction following radical prostatectomy: Secondary | ICD-10-CM | POA: Diagnosis not present

## 2022-04-13 DIAGNOSIS — C61 Malignant neoplasm of prostate: Secondary | ICD-10-CM | POA: Diagnosis not present

## 2022-04-13 DIAGNOSIS — Z87442 Personal history of urinary calculi: Secondary | ICD-10-CM | POA: Diagnosis not present

## 2022-04-19 DIAGNOSIS — N1832 Chronic kidney disease, stage 3b: Secondary | ICD-10-CM | POA: Diagnosis not present

## 2022-04-25 DIAGNOSIS — N200 Calculus of kidney: Secondary | ICD-10-CM | POA: Diagnosis not present

## 2022-04-25 DIAGNOSIS — N1832 Chronic kidney disease, stage 3b: Secondary | ICD-10-CM | POA: Diagnosis not present

## 2022-04-25 DIAGNOSIS — N281 Cyst of kidney, acquired: Secondary | ICD-10-CM | POA: Diagnosis not present

## 2022-04-25 DIAGNOSIS — E872 Acidosis, unspecified: Secondary | ICD-10-CM | POA: Diagnosis not present

## 2022-04-25 DIAGNOSIS — E785 Hyperlipidemia, unspecified: Secondary | ICD-10-CM | POA: Diagnosis not present

## 2022-04-25 DIAGNOSIS — I129 Hypertensive chronic kidney disease with stage 1 through stage 4 chronic kidney disease, or unspecified chronic kidney disease: Secondary | ICD-10-CM | POA: Diagnosis not present

## 2022-04-25 DIAGNOSIS — M179 Osteoarthritis of knee, unspecified: Secondary | ICD-10-CM | POA: Diagnosis not present

## 2022-04-25 DIAGNOSIS — I2510 Atherosclerotic heart disease of native coronary artery without angina pectoris: Secondary | ICD-10-CM | POA: Diagnosis not present

## 2022-04-25 DIAGNOSIS — C61 Malignant neoplasm of prostate: Secondary | ICD-10-CM | POA: Diagnosis not present

## 2022-04-25 DIAGNOSIS — N179 Acute kidney failure, unspecified: Secondary | ICD-10-CM | POA: Diagnosis not present

## 2022-05-17 NOTE — Progress Notes (Signed)
CARDIOLOGY OFFICE NOTE  Date:  05/23/2022    [name] [name]. Date of Birth: 06-11-1945 Medical Record [id number]  PCP:  [name], MD  Cardiologist:  Johnsie Cancel  No chief complaint on file.   History of Present Illness: [name] [name]. is a 77 y.o. male  has a history of HTN, HLD, DM and CAD. He had prior stenting of the LCX in November of 2019. Did have recurrent pain post intervention and was placed on Imdur. Follow up Myoview 02/2018 non ischemic with EF of 60%. Ended up having repeat cath 03/2018 - to manage medically.  Has had chronic dyspnea since his original PCI. This does not appear to be cardiac in nature with Normal CPX 01/07/19    Post right TKR 07/23/19 [name] No cardiac issues  Seeing [name] for prostate cancer Has recurrence locally post Rx and under observation with serial PSA   BP has been high Seeing renal doctor now for stage 3 CRF Dr Albertine Patricia  Cr 2.06 01/10/2022   2023 Went to [employer] to swim with [place]  He is ex Scientist, clinical (histocompatibility and immunogenetics) and fought in [place]  Had back pain with left renal stone 01/09/22   No angina discussed having ETT in 6 months Norvasc dose lowered due to LE edema HR/BP are fine at home Discussed low HR and possible need for PPM in future ECG stable  Past Medical History:  Diagnosis Date   ABNORMAL EKG 10/07/2008   ADVEF, DRUG/MED/BIOL SUBST, OTHER DRUG NOS 09/26/2006   Agent orange exposure    Allergy    seasonal   Cataract    left eye-surgery 07-2015   DEGENERATIVE JOINT DISEASE, MODERATE 04/13/2007   DIVERTICULOSIS, COLON 04/13/2007   ELEVATED PROSTATE SPECIFIC ANTIGEN 04/14/2007   GERD (gastroesophageal reflux disease)    Glaucoma    sees [name]    History of kidney stones    1970    HYPERLIPIDEMIA 09/26/2006   HYPERTENSION 04/13/2007   JOINT EFFUSION, KNEE 06/15/2009   Melanoma ([place])    sees [name]    Numbness and tingling    Pre-diabetes    PROSTATE CANCER 10/05/2009   sees [name]   Renal insufficiency    stage III  [facility]     Past Surgical History:  Procedure Laterality Date   ACROMIOPLASTY Right 05-06-14   per Dr. Macy Mis     bilateral   CATARACT EXTRACTION Left    per [name]    COLONOSCOPY  07/10/2015   per [name], benign polyp, repeat in 10 yrs    CORONARY STENT INTERVENTION N/A 01/17/2018   Procedure: CORONARY STENT INTERVENTION;  Surgeon: [name], MD;  Location: [place] [facility];  Service: Cardiovascular;  Laterality: N/A;   CYSTECTOMY     benign from hand   GLAUCOMA SURGERY Bilateral 2014    heart stent     INSERTION OF MESH N/A 01/10/2013   Procedure: INSERTION OF MESH;  Surgeon: Earnstine Regal, MD;  Location: [place];  Service: General;  Laterality: N/A;   KNEE ARTHROSCOPY Right Jan. 2013   right knee, per [name]    KNEE ARTHROSCOPY Left 01-14-15   per [name]    LEFT HEART CATH AND CORONARY ANGIOGRAPHY N/A 01/17/2018   Procedure: LEFT HEART CATH AND CORONARY ANGIOGRAPHY;  Surgeon: [name], MD;  Location: [place] [facility];  Service: Cardiovascular;  Laterality: N/A;   LEFT  HEART CATH AND CORONARY ANGIOGRAPHY N/A 04/06/2018   Procedure: LEFT HEART CATH AND CORONARY ANGIOGRAPHY;  Surgeon: [name], MD;  Location: [place] [facility];  Service: Cardiovascular;  Laterality: N/A;   MELANOMA EXCISION  2012   per [name]   PROSTATECTOMY     per [name]   TONSILLECTOMY     TOTAL KNEE ARTHROPLASTY Right 07/22/2019   Procedure: TOTAL KNEE ARTHROPLASTY;  Surgeon: Netta Cedars, MD;  [facility];  Service: Orthopedics;  Laterality: Right;   TOTAL KNEE ARTHROPLASTY Left 07/10/2020   Procedure: TOTAL KNEE ARTHROPLASTY;  Surgeon: Netta Cedars, MD;  [facility];  Service: Orthopedics;  Laterality: Left;   UMBILICAL HERNIA REPAIR  11/01/2011   Procedure: HERNIA REPAIR UMBILICAL ADULT;  Surgeon: Earnstine Regal, MD;  Location: [place];  Service: General;  Laterality: N/A;  Repair umbilical hernia with mesh patch    VASECTOMY     VENTRAL HERNIA REPAIR  01/10/2013   with mesh     [name] HERNIA REPAIR N/A 01/10/2013   Procedure: HERNIA REPAIR VENTRAL ADULT ;  Surgeon: Earnstine Regal, MD;  Location: [place];  Service: General;  Laterality: N/A;     Medications: Current Meds  Medication Sig   amLODipine (NORVASC) 5 MG tablet Take 5 mg by mouth daily.   aspirin EC 81 MG tablet Take 81 mg by mouth daily. Swallow whole.   bimatoprost (LUMIGAN) 0.01 % SOLN Place 1 drop into both eyes at bedtime.   clopidogrel (PLAVIX) 75 MG tablet Take 1 tablet (75 mg total) by mouth daily with breakfast. (Patient taking differently: Take 75 mg by mouth daily.)   dorzolamide-timolol (COSOPT) 22.3-6.8 MG/ML ophthalmic solution Place 1 drop into both eyes 2 (two) times daily.    Ergocalciferol (VITAMIN D2) 50 MCG (2000 UT) TABS Take 2,000 Units by mouth daily.   famotidine (PEPCID) 20 MG tablet Take 20 mg by mouth at bedtime.    fenofibrate 160 MG tablet Take 160 mg by mouth at bedtime.    fluticasone (FLONASE) 50 MCG/ACT nasal spray Place 1 spray into both nostrils daily as needed for allergies.   isosorbide mononitrate (IMDUR) 30 MG 24 hr tablet Take 30 mg by mouth daily.   magnesium oxide (MAG-OX) 400 MG tablet Take 400 mg by mouth every evening.    nitroGLYCERIN (NITROSTAT) 0.4 MG SL tablet Place 0.4 mg under the tongue every 5 (five) minutes as needed for chest pain.   pantoprazole (PROTONIX) 40 MG tablet Take 1 tablet (40 mg total) by mouth daily.   rosuvastatin (CRESTOR) 40 MG tablet Take 40 mg by mouth daily.   telmisartan (MICARDIS) 80 MG tablet Take 1 tablet (80 mg total) by mouth daily.     Allergies: No Known Allergies  Social History: The patient  reports that he quit smoking about 50 years ago. His smoking use included cigarettes. He started smoking about 59 years ago. He has a 9.00 pack-year smoking history. He has never used smokeless tobacco. He reports current alcohol use. He reports that he  does not use drugs.   Family History: The patient's family history includes Diabetes in his mother; Heart failure in his father; Prostate cancer in his father; Stroke in his mother.   Review of Systems: Please see the history of present illness.   All other systems are reviewed and negative.   Physical Exam: VS:  BP 132/74   Pulse (!) 48   Ht 5'  6" (1.676 m)   Wt 204 lb (92.5 kg)   SpO2 96%   BMI 32.93 kg/m  .  BMI Body mass index is 32.93 kg/m.  Wt Readings from Last 3 Encounters:  05/23/22 204 lb (92.5 kg)  05/20/22 205 lb (93 kg)  01/07/22 204 lb (92.5 kg)   Affect appropriate Healthy:  appears stated age 76: normal Neck supple with no adenopathy JVP normal no bruits no thyromegaly Lungs clear with no wheezing and good diaphragmatic motion Heart:  S1/S2 no murmur, no rub, gallop or click PMI normal Abdomen: benighn, BS positve, no tenderness, no AAA no bruit.  No HSM or HJR Distal pulses intact with no bruits No edema Neuro non-focal Skin warm and dry Post right TKR     LABORATORY DATA:  EKG:  05/23/2022 NSR rate 48 PR 228 msec nonspecific ST changes    Lab Results  Component Value Date   WBC 8.3 05/20/2022   HGB 14.8 05/20/2022   HCT 43.7 05/20/2022   PLT 212.0 05/20/2022   GLUCOSE 115 (H) 05/20/2022   CHOL 156 05/20/2022   TRIG 143.0 05/20/2022   HDL 45.50 05/20/2022   LDLDIRECT 82.1 04/07/2011   LDLCALC 82 05/20/2022   ALT 20 01/10/2022   AST 25 01/10/2022   NA 141 05/20/2022   K 4.2 05/20/2022   CL 109 05/20/2022   CREATININE 1.69 (H) 05/20/2022   BUN 25 (H) 05/20/2022   CO2 22 05/20/2022   TSH 3.22 11/19/2021   PSA 0.38 11/06/2015   HGBA1C 7.0 (H) 05/20/2022     Other Studies Reviewed Today:  Cath 04/06/18   Conclusion    Widely patent mid circumflex drug-eluting stent.  The circumflex beyond the stent is unchanged from before with a 50 to 70% distal bifurcation stenosis in a [place] 110 configuration.  The circumflex is otherwise  patent. Left main is normal. LAD is widely patent.  A branch of the first diagonal contains ostial 80% narrowing.  This tertiary branches are small. RCA is widely patent with distal PDA and LV branch diffuse disease. Normal LV function and LVEDP 10 mmHg.   RECOMMENDATIONS:   It is conceivable that angina is due to the very distal bifurcation disease in the circumflex.  It does not appear to be angiographically severe.  Stent implantation would include crossing over an equally large side branch.  Considering the stability, complexity of an interventional procedure (with risk of side branch occlusion), and the far distal location, recommend continued conservative medical management possibly adding either Ranexa or beta-blocker therapy to the regimen.        ASSESSMENT AND PLAN:   1. CAD: 01/17/18 stent to the prox to mid Cx Single vessel disease with good result. persistent symptoms F/U myovue done 03/01/18 low risk with no ischemia EF 60%   Repeat Cath 04/06/18 with widely patent stent with medical Rx recommended  Likely ETT/myovue in 6 months since he does not get classic angina   2. Ortho:  Post right TKR [place] 07/23/19    3. Hypertension: improved with addition of Norvasc   4. HLD:  continue fibrates and statin  crestor increased by primary on 01/08/21 for LDL 88    5. GERD -  Low carb diet protonix   6. DM -  Discussed low carb diet.  Target hemoglobin A1c is 6.5 or less.  Continue current medications. A1c 6.7 11/16/20   7. Chronic dyspnea -  CPX 01/07/19 with no cardiopulmonary limitations Stopped due to ventilatory limit  due to body habitus   8. Obesity - see above.   9. Prostate Cancer:  F/u [name] has recurrence in prostate bed likely PET negative 06/30/20   Current medicines are reviewed with the patient today.  The patient does not have concerns regarding medicines other than what has been noted above.  The following changes have been made:  See above.  Labs/ tests ordered  today include:   No orders of the defined types were placed in this encounter.    Disposition:   FU in a year .   Signed: Jenkins Rouge, MD  05/23/2022 8:38 AM  [facility] [address] [place], [place]  [address] Phone: [phone] Fax: [phone]

## 2022-05-20 ENCOUNTER — Encounter: Payer: Self-pay | Admitting: Family Medicine

## 2022-05-20 ENCOUNTER — Ambulatory Visit (INDEPENDENT_AMBULATORY_CARE_PROVIDER_SITE_OTHER): Payer: Medicare Other | Admitting: Family Medicine

## 2022-05-20 VITALS — BP 128/68 | HR 65 | Temp 98.0°F | Wt 205.0 lb

## 2022-05-20 DIAGNOSIS — E785 Hyperlipidemia, unspecified: Secondary | ICD-10-CM

## 2022-05-20 DIAGNOSIS — N1831 Chronic kidney disease, stage 3a: Secondary | ICD-10-CM

## 2022-05-20 DIAGNOSIS — M159 Polyosteoarthritis, unspecified: Secondary | ICD-10-CM | POA: Diagnosis not present

## 2022-05-20 DIAGNOSIS — R739 Hyperglycemia, unspecified: Secondary | ICD-10-CM | POA: Diagnosis not present

## 2022-05-20 DIAGNOSIS — I2510 Atherosclerotic heart disease of native coronary artery without angina pectoris: Secondary | ICD-10-CM | POA: Diagnosis not present

## 2022-05-20 DIAGNOSIS — I10 Essential (primary) hypertension: Secondary | ICD-10-CM | POA: Diagnosis not present

## 2022-05-20 LAB — LIPID PANEL
Cholesterol: 156 mg/dL (ref 0–200)
HDL: 45.5 mg/dL (ref >39.00)
LDL Cholesterol: 82 mg/dL (ref 0–99)
NonHDL: 110.94
Total CHOL/HDL Ratio: 3
Triglycerides: 143 mg/dL (ref 0.0–149.0)
VLDL: 28.6 mg/dL (ref 0.0–40.0)

## 2022-05-20 LAB — CBC WITH DIFFERENTIAL/PLATELET
Basophils Absolute: 0.1 10*3/uL (ref 0.0–0.1)
Basophils Relative: 1 % (ref 0.0–3.0)
Eosinophils Absolute: 0.1 10*3/uL (ref 0.0–0.7)
Eosinophils Relative: 1.1 % (ref 0.0–5.0)
HCT: 43.7 % (ref 39.0–52.0)
Hemoglobin: 14.8 g/dL (ref 13.0–17.0)
Lymphocytes Relative: 27.5 % (ref 12.0–46.0)
Lymphs Abs: 2.3 10*3/uL (ref 0.7–4.0)
MCHC: 33.9 g/dL (ref 30.0–36.0)
MCV: 88.9 fl (ref 78.0–100.0)
Monocytes Absolute: 0.7 10*3/uL (ref 0.1–1.0)
Monocytes Relative: 8.6 % (ref 3.0–12.0)
Neutro Abs: 5.1 10*3/uL (ref 1.4–7.7)
Neutrophils Relative %: 61.8 % (ref 43.0–77.0)
Platelets: 212 10*3/uL (ref 150.0–400.0)
RBC: 4.91 Mil/uL (ref 4.22–5.81)
RDW: 14.2 % (ref 11.5–15.5)
WBC: 8.3 10*3/uL (ref 4.0–10.5)

## 2022-05-20 LAB — HEMOGLOBIN A1C: Hgb A1c MFr Bld: 7 % — ABNORMAL HIGH (ref 4.6–6.5)

## 2022-05-20 LAB — BASIC METABOLIC PANEL
BUN: 25 mg/dL — ABNORMAL HIGH (ref 6–23)
CO2: 22 mEq/L (ref 19–32)
Calcium: 9.6 mg/dL (ref 8.4–10.5)
Chloride: 109 mEq/L (ref 96–112)
Creatinine, Ser: 1.69 mg/dL — ABNORMAL HIGH (ref 0.40–1.50)
GFR: 39.49 mL/min — ABNORMAL LOW (ref >60.00)
Glucose, Bld: 115 mg/dL — ABNORMAL HIGH (ref 70–99)
Potassium: 4.2 mEq/L (ref 3.5–5.1)
Sodium: 141 mEq/L (ref 135–145)

## 2022-05-20 NOTE — Progress Notes (Signed)
   Subjective:    Patient ID: [name] [name]., male    DOB: Mar 04, 1946, 77 y.o.   MRN: [id number]  HPI Here for a 6 month follow up. He feels well except for the mild SOB that he has had for years. He passed a kidney stone in November, and he has had no further bleeding or pain. He saw Dr. Arty Baumgartner in February, and his renal function is stable. He recently saw Dr. Roni Bread and his elevated PSA is stable. He swims almost every day.    Review of Systems  Constitutional: Negative.   Respiratory:  Positive for shortness of breath. Negative for cough and chest tightness.   Cardiovascular: Negative.   Gastrointestinal: Negative.   Genitourinary: Negative.        Objective:   Physical Exam Constitutional:      Appearance: Normal appearance.  Cardiovascular:     Rate and Rhythm: Normal rate and regular rhythm.     Pulses: Normal pulses.     Heart sounds: Normal heart sounds.  Pulmonary:     Effort: Pulmonary effort is normal.     Breath sounds: Normal breath sounds.  Musculoskeletal:     Right lower leg: No edema.     Left lower leg: No edema.  Neurological:     Mental Status: He is alert.           Assessment & Plan:  He seems to be doing well from a cardiologic, and urologic, and renal standpoint. His HTN is stable. He will see [name] next week for a cardiology follow up. Get fasting labs today including lipids and A1c.  Alysia Penna, MD  Alysia Penna, MD

## 2022-05-23 ENCOUNTER — Ambulatory Visit: Payer: Medicare Other | Attending: Cardiovascular Disease | Admitting: Cardiovascular Disease

## 2022-05-23 ENCOUNTER — Encounter: Payer: Self-pay | Admitting: Cardiovascular Disease

## 2022-05-23 VITALS — BP 132/74 | HR 48 | Ht 66.0 in | Wt 204.0 lb

## 2022-05-23 DIAGNOSIS — I2510 Atherosclerotic heart disease of native coronary artery without angina pectoris: Secondary | ICD-10-CM | POA: Diagnosis not present

## 2022-05-23 DIAGNOSIS — R0609 Other forms of dyspnea: Secondary | ICD-10-CM

## 2022-05-23 DIAGNOSIS — I10 Essential (primary) hypertension: Secondary | ICD-10-CM | POA: Insufficient documentation

## 2022-05-23 NOTE — Patient Instructions (Addendum)
Medication Instructions:  Your physician recommends that you continue on your current medications as directed. Please refer to the Current Medication list given to you today.  *If you need a refill on your cardiac medications before your next appointment, please call your pharmacy*  Lab Work: If you have labs (blood work) drawn today and your tests are completely normal, you will receive your results only by: MyChart Message (if you have MyChart) OR A paper copy in the mail If you have any lab test that is abnormal or we need to change your treatment, we will call you to review the results.  Testing/Procedures: None ordered today.  Follow-Up: At [facility], you and your health needs are our priority.  As part of our continuing mission to provide you with exceptional heart care, we have created designated Provider Care Teams.  These Care Teams include your primary Cardiologist (physician) and Advanced Practice Providers (APPs -  Physician Assistants and Nurse Practitioners) who all work together to provide you with the care you need, when you need it.  We recommend signing up for the patient portal called "MyChart".  Sign up information is provided on this After Visit Summary.  MyChart is used to connect with patients for Virtual Visits (Telemedicine).  Patients are able to view lab/test results, encounter notes, upcoming appointments, etc.  Non-urgent messages can be sent to your provider as well.   To learn more about what you can do with MyChart, go to [email or web address].    Your next appointment:   6 month(s)  Provider:   Peter Nishan, MD     

## 2022-05-24 ENCOUNTER — Encounter: Payer: Self-pay | Admitting: Family Medicine

## 2022-05-24 DIAGNOSIS — E1122 Type 2 diabetes mellitus with diabetic chronic kidney disease: Secondary | ICD-10-CM | POA: Insufficient documentation

## 2022-05-31 ENCOUNTER — Other Ambulatory Visit: Payer: Self-pay

## 2022-05-31 DIAGNOSIS — R739 Hyperglycemia, unspecified: Secondary | ICD-10-CM

## 2022-07-18 ENCOUNTER — Telehealth: Payer: Self-pay

## 2022-07-18 ENCOUNTER — Ambulatory Visit (INDEPENDENT_AMBULATORY_CARE_PROVIDER_SITE_OTHER): Payer: Medicare Other

## 2022-07-18 VITALS — Ht 66.0 in | Wt 182.0 lb

## 2022-07-18 DIAGNOSIS — Z0000 Encounter for general adult medical examination without abnormal findings: Secondary | ICD-10-CM | POA: Diagnosis not present

## 2022-07-18 NOTE — Progress Notes (Signed)
Patient ID: [name] Luebke., male   DOB: Jul 07, 1945, 77 y.o.   MRN: [id number]  Care Management & Coordination Services Pharmacy Team  Reason for Encounter: Hypertension  Contacted patient to discuss hypertension disease state. Spoke with patient on 08/03/2022     Current antihypertensive regimen:  Amlodipine 5 mg 1 tablet daily  Telmisartan 80 mg 1 tablet daily   Patient verbally confirms he is taking the above medications as directed. Yes  How often are you checking your Blood Pressure? 1-2x per week  he checks his blood pressure in the afternoon after taking his medication.  Current home BP readings:  Patient reports readings between 105/60-115/60   Any readings above 180/100? No If yes any symptoms of hypertensive emergency? patient denies any symptoms of high blood pressure  What recent interventions/DTPs have been made by any provider to improve Blood Pressure control since last CPP Visit: Patient reports at his last follow up his A1C was high and he was told he may have to be on more medications so he has really buckled down and gotten serous with his activity and eating. He reports weight loss of 26 1 bs and his last check at the [place] was 7 for his A1C. He denies any symptoms of hypotension.   Any recent hospitalizations or ED visits since last visit with CPP? No   Adherence Review: Is the patient currently on ACE/ARB medication? Yes Does the patient have >5 day gap between last estimated fill dates? No  Star Rating Drugs:  Telmisartan (Micardis) 80 mg - Last filled at [facility] mail [facility]) Rosuvastatin (Crestor) 20 mg - Last filled at [facility] mail order    Chart Updates: Recent office visits:  07/18/22 Tillie Rung, LPN - Patient presented via phone for Medicare Wellness exam. No medication changes.   05/20/22 Nelwyn Salisbury, MD - Patient presented for Essential hypertension and other concerns. No mediation changes.   02/23/22 Marcellino, Darliss Ridgel, MD(ENT &  Audiology) - Patent presented for  Subglottic stenosis and other concerns.    Recent consult visits:  05/23/22 Wendall Stade, MD (Cardiology) - Patient presented for Coronary artery disease involving native coronary artery of native heart without angina pectoris and other concerns.   Hospital visits:  None in previous 6 months  Medications: Outpatient Encounter Medications as of 07/18/2022  Medication Sig   amLODipine (NORVASC) 5 MG tablet Take 5 mg by mouth daily.   aspirin EC 81 MG tablet Take 81 mg by mouth daily. Swallow whole.   bimatoprost (LUMIGAN) 0.01 % SOLN Place 1 drop into both eyes at bedtime.   clopidogrel (PLAVIX) 75 MG tablet Take 1 tablet (75 mg total) by mouth daily with breakfast. (Patient taking differently: Take 75 mg by mouth daily.)   dorzolamide-timolol (COSOPT) 22.3-6.8 MG/ML ophthalmic solution Place 1 drop into both eyes 2 (two) times daily.    Ergocalciferol (VITAMIN D2) 50 MCG (2000 UT) TABS Take 2,000 Units by mouth daily.   famotidine (PEPCID) 20 MG tablet Take 20 mg by mouth at bedtime.    fenofibrate 160 MG tablet Take 160 mg by mouth at bedtime.    fluticasone (FLONASE) 50 MCG/ACT nasal spray Place 1 spray into both nostrils daily as needed for allergies.   isosorbide mononitrate (IMDUR) 30 MG 24 hr tablet Take 30 mg by mouth daily.   magnesium oxide (MAG-OX) 400 MG tablet Take 400 mg by mouth every evening.    nitroGLYCERIN (NITROSTAT) 0.4 MG SL tablet Place 0.4 mg  under the tongue every 5 (five) minutes as needed for chest pain.   pantoprazole (PROTONIX) 40 MG tablet Take 1 tablet (40 mg total) by mouth daily.   rosuvastatin (CRESTOR) 40 MG tablet Take 40 mg by mouth daily.   telmisartan (MICARDIS) 80 MG tablet Take 1 tablet (80 mg total) by mouth daily.   No facility-administered encounter medications on file as of 07/18/2022.    Recent Office Vitals: BP Readings from Last 3 Encounters:  05/23/22 132/74  05/20/22 128/68  01/10/22 (!) 149/81    Pulse Readings from Last 3 Encounters:  05/23/22 (!) 48  05/20/22 65  01/11/22 63    Wt Readings from Last 3 Encounters:  07/18/22 182 lb (82.6 kg)  05/23/22 204 lb (92.5 kg)  05/20/22 205 lb (93 kg)     Kidney Function Lab Results  Component Value Date/Time   CREATININE 1.69 (H) 05/20/2022 11:36 AM   CREATININE 2.06 (H) 01/10/2022 05:25 PM   CREATININE 1.58 (H) 11/14/2019 09:36 AM   GFR 39.49 (L) 05/20/2022 11:36 AM   GFRNONAA 33 (L) 01/10/2022 05:25 PM   GFRAA 57 (L) 07/23/2019 03:06 AM       Latest Ref Rng & Units 05/20/2022   11:36 AM 01/10/2022    5:25 PM 11/19/2021    9:50 AM  BMP  Glucose 70 - 99 mg/dL 782  956  213   BUN 6 - 23 mg/dL 25  23  25    Creatinine 0.40 - 1.50 mg/dL 0.86  5.78  4.69   Sodium 135 - 145 mEq/L 141  138  140   Potassium 3.5 - 5.1 mEq/L 4.2  3.5  4.2   Chloride 96 - 112 mEq/L 109  108  107   CO2 19 - 32 mEq/L 22  20  23    Calcium 8.4 - 10.5 mg/dL 9.6  9.4  9.2        Pamala Duffel CMA Clinical Pharmacist Assistant [phone]

## 2022-07-18 NOTE — Patient Instructions (Addendum)
[name] , Thank you for taking time to come for your Medicare Wellness Visit. I appreciate your ongoing commitment to your health goals. Please review the following plan we discussed and let me know if I can assist you in the future.   These are the goals we discussed:  Goals       Increase physical activity (pt-stated)      Keep going swimming and lose a little weight.      Patient Stated      Get endoscopy, and keep swimming and diving! Safe travels in April!      Patient stated (pt-stated)      I want to continue to lose weight.      Weight (lb) < 175 lb (79.4 kg)      Keep going swimming  Check out  online nutrition programs as WikiBlast.com.cy and LimitLaws.com.cy; fit28me; Look for foods with "whole" wheat; bran; oatmeal etc Shot at the farmer's markets in season for fresher choices  Watch for "hydrogenated" on the label of oils which are trans-fats.  Watch for "high fructose corn syrup" in snacks, yogurt or ketchup  Meats have less marbling; bright colored fruits and vegetables;  Canned; dump out liquid and wash vegetables. Be mindful of what we are eating  Portion control is essential to a health weight! Sit down; take a break and enjoy your meal; take smaller bites; put the fork down between bites;  It takes 20 minutes to get full; so check in with your fullness cues and stop eating when you start to fill full               This is a list of the screening recommended for you and due dates:  Health Maintenance  Topic Date Due   Eye exam for diabetics  07/18/2022*   Yearly kidney health urinalysis for diabetes  07/19/2022*   COVID-19 Vaccine (4 - 2023-24 season) 08/03/2022*   Flu Shot  10/06/2022   Hemoglobin A1C  11/20/2022   Yearly kidney function blood test for diabetes  05/20/2023   Medicare Annual Wellness Visit  07/18/2023   Colon Cancer Screening  07/19/2025   DTaP/Tdap/Td vaccine (3 - Td or Tdap) 02/09/2028   Pneumonia Vaccine  Completed   Zoster  (Shingles) Vaccine  Completed   HPV Vaccine  Aged Out   Complete foot exam   Discontinued  *Topic was postponed. The date shown is not the original due date.    Advanced directives: Advance directive discussed with you today. Even though you declined this today, please call our office should you change your mind, and we can give you the proper paperwork for you to fill out.   Conditions/risks identified: None  Next appointment: Follow up in one year for your annual wellness visit.   Preventive Care 24 Years and Older, Male  Preventive care refers to lifestyle choices and visits with your health care provider that can promote health and wellness. What does preventive care include? A yearly physical exam. This is also called an annual well check. Dental exams once or twice a year. Routine eye exams. Ask your health care provider how often you should have your eyes checked. Personal lifestyle choices, including: Daily care of your teeth and gums. Regular physical activity. Eating a healthy diet. Avoiding tobacco and drug use. Limiting alcohol use. Practicing safe sex. Taking low doses of aspirin every day. Taking vitamin and mineral supplements as recommended by your health care provider. What happens during an annual  well check? The services and screenings done by your health care provider during your annual well check will depend on your age, overall health, lifestyle risk factors, and family history of disease. Counseling  Your health care provider may ask you questions about your: Alcohol use. Tobacco use. Drug use. Emotional well-being. Home and relationship well-being. Sexual activity. Eating habits. History of falls. Memory and ability to understand (cognition). Work and work Astronomer. Screening  You may have the following tests or measurements: Height, weight, and BMI. Blood pressure. Lipid and cholesterol levels. These may be checked every 5 years, or more  frequently if you are over 10 years old. Skin check. Lung cancer screening. You may have this screening every year starting at age 82 if you have a 30-pack-year history of smoking and currently smoke or have quit within the past 15 years. Fecal occult blood test (FOBT) of the stool. You may have this test every year starting at age 85. Flexible sigmoidoscopy or colonoscopy. You may have a sigmoidoscopy every 5 years or a colonoscopy every 10 years starting at age 38. Prostate cancer screening. Recommendations will vary depending on your family history and other risks. Hepatitis C blood test. Hepatitis B blood test. Sexually transmitted disease (STD) testing. Diabetes screening. This is done by checking your blood sugar (glucose) after you have not eaten for a while (fasting). You may have this done every 1-3 years. Abdominal aortic aneurysm (AAA) screening. You may need this if you are a current or former smoker. Osteoporosis. You may be screened starting at age 37 if you are at high risk. Talk with your health care provider about your test results, treatment options, and if necessary, the need for more tests. Vaccines  Your health care provider may recommend certain vaccines, such as: Influenza vaccine. This is recommended every year. Tetanus, diphtheria, and acellular pertussis (Tdap, Td) vaccine. You may need a Td booster every 10 years. Zoster vaccine. You may need this after age 29. Pneumococcal 13-valent conjugate (PCV13) vaccine. One dose is recommended after age 52. Pneumococcal polysaccharide (PPSV23) vaccine. One dose is recommended after age 70. Talk to your health care provider about which screenings and vaccines you need and how often you need them. This information is not intended to replace advice given to you by your health care provider. Make sure you discuss any questions you have with your health care provider. Document Released: 03/20/2015 Document Revised: 11/11/2015  Document Reviewed: 12/23/2014 Elsevier Interactive Patient Education  2017 [employer].  Fall Prevention in the Home Falls can cause injuries. They can happen to people of all ages. There are many things you can do to make your home safe and to help prevent falls. What can I do on the outside of my home? Regularly fix the edges of walkways and driveways and fix any cracks. Remove anything that might make you trip as you walk through a door, such as a raised step or threshold. Trim any bushes or trees on the path to your home. Use bright outdoor lighting. Clear any walking paths of anything that might make someone trip, such as rocks or tools. Regularly check to see if handrails are loose or broken. Make sure that both sides of any steps have handrails. Any raised decks and porches should have guardrails on the edges. Have any leaves, snow, or ice cleared regularly. Use sand or salt on walking paths during winter. Clean up any spills in your garage right away. This includes oil or grease spills. What  can I do in the bathroom? Use night lights. Install grab bars by the toilet and in the tub and shower. Do not use towel bars as grab bars. Use non-skid mats or decals in the tub or shower. If you need to sit down in the shower, use a plastic, non-slip stool. Keep the floor dry. Clean up any water that spills on the floor as soon as it happens. Remove soap buildup in the tub or shower regularly. Attach bath mats securely with double-sided non-slip rug tape. Do not have throw rugs and other things on the floor that can make you trip. What can I do in the bedroom? Use night lights. Make sure that you have a light by your bed that is easy to reach. Do not use any sheets or blankets that are too big for your bed. They should not hang down onto the floor. Have a firm chair that has side arms. You can use this for support while you get dressed. Do not have throw rugs and other things on the floor  that can make you trip. What can I do in the kitchen? Clean up any spills right away. Avoid walking on wet floors. Keep items that you use a lot in easy-to-reach places. If you need to reach something above you, use a strong step stool that has a grab bar. Keep electrical cords out of the way. Do not use floor polish or wax that makes floors slippery. If you must use wax, use non-skid floor wax. Do not have throw rugs and other things on the floor that can make you trip. What can I do with my stairs? Do not leave any items on the stairs. Make sure that there are handrails on both sides of the stairs and use them. Fix handrails that are broken or loose. Make sure that handrails are as long as the stairways. Check any carpeting to make sure that it is firmly attached to the stairs. Fix any carpet that is loose or worn. Avoid having throw rugs at the top or bottom of the stairs. If you do have throw rugs, attach them to the floor with carpet tape. Make sure that you have a light switch at the top of the stairs and the bottom of the stairs. If you do not have them, ask someone to add them for you. What else can I do to help prevent falls? Wear shoes that: Do not have high heels. Have rubber bottoms. Are comfortable and fit you well. Are closed at the toe. Do not wear sandals. If you use a stepladder: Make sure that it is fully opened. Do not climb a closed stepladder. Make sure that both sides of the stepladder are locked into place. Ask someone to hold it for you, if possible. Clearly mark and make sure that you can see: Any grab bars or handrails. First and last steps. Where the edge of each step is. Use tools that help you move around (mobility aids) if they are needed. These include: Canes. Walkers. Scooters. Crutches. Turn on the lights when you go into a dark area. Replace any light bulbs as soon as they burn out. Set up your furniture so you have a clear path. Avoid moving your  furniture around. If any of your floors are uneven, fix them. If there are any pets around you, be aware of where they are. Review your medicines with your doctor. Some medicines can make you feel dizzy. This can increase your chance of  falling. Ask your doctor what other things that you can do to help prevent falls. This information is not intended to replace advice given to you by your health care provider. Make sure you discuss any questions you have with your health care provider. Document Released: 12/18/2008 Document Revised: 07/30/2015 Document Reviewed: 03/28/2014 Elsevier Interactive Patient Education  2017 [employer].

## 2022-07-18 NOTE — Progress Notes (Signed)
Subjective:   [name] [name]. is a 77 y.o. male who presents for Medicare Annual/Subsequent preventive examination.  Review of Systems    Virtual Visit via Telephone Note  I connected with  Ventura Helmes. on 07/18/22 at  9:45 AM EDT by telephone and verified that I am speaking with the correct person using two identifiers.  Location: Patient: Home Provider: Office Persons participating in the virtual visit: patient/Nurse Health Advisor   I discussed the limitations, risks, security and privacy concerns of performing an evaluation and management service by telephone and the availability of in person appointments. The patient expressed understanding and agreed to proceed.  Interactive audio and video telecommunications were attempted between this nurse and patient, however failed, due to patient having technical difficulties OR patient did not have access to video capability.  We continued and completed visit with audio only.  Some vital signs may be absent or patient reported.   Tillie Rung, LPN  Cardiac Risk Factors include: advanced age (>21men, >79 women);male gender;diabetes mellitus;hypertension     Objective:    Today's Vitals   07/18/22 0941  Weight: 182 lb (82.6 kg)  Height: 5\' 6"  (1.676 m)   Body mass index is 29.38 kg/m.     07/18/2022    9:52 AM 01/10/2022    5:00 PM 07/15/2021   10:37 AM 07/13/2020   11:40 AM 07/10/2020    6:40 PM 07/10/2020   11:26 AM 07/09/2020    1:33 PM  Advanced Directives  Does Patient Have a Medical Advance Directive? No No No No  No No  Does patient want to make changes to medical advance directive?      No - Patient declined No - Patient declined  Would patient like information on creating a medical advance directive? No - Patient declined No - Patient declined No - Patient declined  No - Patient declined      Current Medications (verified) Outpatient Encounter Medications as of 07/18/2022  Medication Sig   amLODipine  (NORVASC) 5 MG tablet Take 5 mg by mouth daily.   aspirin EC 81 MG tablet Take 81 mg by mouth daily. Swallow whole.   bimatoprost (LUMIGAN) 0.01 % SOLN Place 1 drop into both eyes at bedtime.   clopidogrel (PLAVIX) 75 MG tablet Take 1 tablet (75 mg total) by mouth daily with breakfast. (Patient taking differently: Take 75 mg by mouth daily.)   dorzolamide-timolol (COSOPT) 22.3-6.8 MG/ML ophthalmic solution Place 1 drop into both eyes 2 (two) times daily.    Ergocalciferol (VITAMIN D2) 50 MCG (2000 UT) TABS Take 2,000 Units by mouth daily.   famotidine (PEPCID) 20 MG tablet Take 20 mg by mouth at bedtime.    fenofibrate 160 MG tablet Take 160 mg by mouth at bedtime.    fluticasone (FLONASE) 50 MCG/ACT nasal spray Place 1 spray into both nostrils daily as needed for allergies.   isosorbide mononitrate (IMDUR) 30 MG 24 hr tablet Take 30 mg by mouth daily.   magnesium oxide (MAG-OX) 400 MG tablet Take 400 mg by mouth every evening.    nitroGLYCERIN (NITROSTAT) 0.4 MG SL tablet Place 0.4 mg under the tongue every 5 (five) minutes as needed for chest pain.   pantoprazole (PROTONIX) 40 MG tablet Take 1 tablet (40 mg total) by mouth daily.   rosuvastatin (CRESTOR) 40 MG tablet Take 40 mg by mouth daily.   telmisartan (MICARDIS) 80 MG tablet Take 1 tablet (80 mg total) by mouth daily.  No facility-administered encounter medications on file as of 07/18/2022.    Allergies (verified) Patient has no known allergies.   History: Past Medical History:  Diagnosis Date   ABNORMAL EKG 10/07/2008   ADVEF, DRUG/MED/BIOL SUBST, OTHER DRUG NOS 09/26/2006   Agent orange exposure    Allergy    seasonal   Cataract    left eye-surgery 07-2015   DEGENERATIVE JOINT DISEASE, MODERATE 04/13/2007   DIVERTICULOSIS, COLON 04/13/2007   ELEVATED PROSTATE SPECIFIC ANTIGEN 04/14/2007   GERD (gastroesophageal reflux disease)    Glaucoma    sees Dr. Dione Booze    History of kidney stones    1970    HYPERLIPIDEMIA 09/26/2006    HYPERTENSION 04/13/2007   JOINT EFFUSION, KNEE 06/15/2009   Melanoma (HCC)    sees Dr. Nita Sells    Numbness and tingling    Pre-diabetes    PROSTATE CANCER 10/05/2009   sees Dr. Annabell Howells   Renal insufficiency    stage III Time Kidney    Past Surgical History:  Procedure Laterality Date   ACROMIOPLASTY Right 05-06-14   per Dr. Daivd Council     bilateral   CATARACT EXTRACTION Left    per Dr. Dione Booze    COLONOSCOPY  07/10/2015   per Dr. Christella Hartigan, benign polyp, repeat in 10 yrs    CORONARY STENT INTERVENTION N/A 01/17/2018   Procedure: CORONARY STENT INTERVENTION;  Surgeon: Marykay Lex, MD;  [facility] INVASIVE CV LAB;  Service: Cardiovascular;  Laterality: N/A;   CYSTECTOMY     benign from hand   GLAUCOMA SURGERY Bilateral 2014    heart stent     INSERTION OF MESH N/A 01/10/2013   Procedure: INSERTION OF MESH;  Surgeon: Velora Heckler, MD;  Location: [facility] OR;  Service: General;  Laterality: N/A;   KNEE ARTHROSCOPY Right Jan. 2013   right knee, per Dr. [name]    KNEE ARTHROSCOPY Left 01-14-15   per Dr. [name]    LEFT HEART CATH AND CORONARY ANGIOGRAPHY N/A 01/17/2018   Procedure: LEFT HEART CATH AND CORONARY ANGIOGRAPHY;  Surgeon: Marykay Lex, MD;  Location: [facility] INVASIVE CV LAB;  Service: Cardiovascular;  Laterality: N/A;   LEFT HEART CATH AND CORONARY ANGIOGRAPHY N/A 04/06/2018   Procedure: LEFT HEART CATH AND CORONARY ANGIOGRAPHY;  Surgeon: Lyn Records, MD;  Location: [facility] INVASIVE CV LAB;  Service: Cardiovascular;  Laterality: N/A;   MELANOMA EXCISION  2012   per Dr. Nita Sells   PROSTATECTOMY     per Dr. Annabell Howells   TONSILLECTOMY     TOTAL KNEE ARTHROPLASTY Right 07/22/2019   Procedure: TOTAL KNEE ARTHROPLASTY;  Surgeon: Beverely Low, MD;  Location: [facility] ORS;  Service: Orthopedics;  Laterality: Right;   TOTAL KNEE ARTHROPLASTY Left 07/10/2020   Procedure: TOTAL KNEE ARTHROPLASTY;  Surgeon: Beverely Low, MD;  Location: [facility] ORS;  Service: Orthopedics;  Laterality: Left;    UMBILICAL HERNIA REPAIR  11/01/2011   Procedure: HERNIA REPAIR UMBILICAL ADULT;  Surgeon: Velora Heckler, MD;  Location:  SURGERY CENTER;  Service: General;  Laterality: N/A;  Repair umbilical hernia with mesh patch   VASECTOMY     VENTRAL HERNIA REPAIR  01/10/2013   with mesh     Dr Eleonore Chiquito HERNIA REPAIR N/A 01/10/2013   Procedure: HERNIA REPAIR VENTRAL ADULT ;  Surgeon: Velora Heckler, MD;  Location: [facility] OR;  Service: General;  Laterality: N/A;   Family History  Problem Relation Age of Onset   Stroke  Mother    Diabetes Mother    Heart failure Father    Prostate cancer Father    Colon cancer Neg Hx    Colon polyps Neg Hx    Esophageal cancer Neg Hx    Rectal cancer Neg Hx    Stomach cancer Neg Hx    Social History   Socioeconomic History   Marital status: Married    Spouse name: Bosie Clos   Number of children: 2   Years of education: 12   Highest education level: 12th grade  Occupational History   Occupation: Physiological scientist    Comment: retired  Tobacco Use   Smoking status: Former    Packs/day: 1.00    Years: 9.00    Additional pack years: 0.00    Total pack years: 9.00    Types: Cigarettes    Start date: 03/08/1963    Quit date: 03/07/1972    Years since quitting: 50.3   Smokeless tobacco: Never   Tobacco comments:    States AAA completed   Vaping Use   Vaping Use: Never used  Substance and Sexual Activity   Alcohol use: Yes    Comment: occasional   Drug use: Never   Sexual activity: Not on file  Other Topics Concern   Not on file  Social History Narrative   Was in the [employer]; submarine and Cabin crew   Currently can go to the [employer] orange exposure   Right-handed.   2-3 cups caffeine daily,      Wife; married 40 years   2 children; grands no       05/11/2018:    Lives with wife on one level home   Enjoys going swimming at gym 3 days/week; currently enjoying cardiac rehab   Has one son, one daughter, and one granddaughter who all live in  [place]. Travels to see them every other week.      Social Determinants of Health   Financial Resource Strain: Low Risk  (07/18/2022)   Overall Financial Resource Strain (CARDIA)    Difficulty of Paying Living Expenses: Not hard at all  Food Insecurity: No Food Insecurity (07/18/2022)   Hunger Vital Sign    Worried About Running Out of Food in the Last Year: Never true    Ran Out of Food in the Last Year: Never true  Transportation Needs: No Transportation Needs (07/18/2022)   PRAPARE - Administrator, Civil Service (Medical): No    Lack of Transportation (Non-Medical): No  Physical Activity: Sufficiently Active (07/18/2022)   Exercise Vital Sign    Days of Exercise per Week: 5 days    Minutes of Exercise per Session: 60 min  Stress: No Stress Concern Present (07/18/2022)   [employer] of Occupational Health - Occupational Stress Questionnaire    Feeling of Stress : Not at all  Social Connections: Moderately Isolated (07/18/2022)   Social Connection and Isolation Panel [NHANES]    Frequency of Communication with Friends and Family: More than three times a week    Frequency of Social Gatherings with Friends and Family: More than three times a week    Attends Religious Services: Never    Database administrator or Organizations: No    Attends Banker Meetings: Never    Marital Status: Married    Tobacco Counseling Counseling given: Not Answered Tobacco comments: States AAA completed    Clinical Intake:  Pre-visit preparation completed: Yes  Pain : No/denies pain  BMI - recorded: 29.38 Nutritional Status: BMI 25 -29 Overweight Nutritional Risks: None Diabetes: Yes CBG done?: No Did pt. bring in CBG monitor from home?: No  How often do you need to have someone help you when you read instructions, pamphlets, or other written materials from your doctor or pharmacy?: 1 - Never  Diabetic?  Pre Diabetic  Interpreter Needed?: No  Information  entered by :: Theresa Mulligan LPN   Activities of Daily Living    07/18/2022    9:50 AM 07/14/2022    9:01 AM  In your present state of health, do you have any difficulty performing the following activities:  Hearing? 1 1  Comment Wears hearing aids   Vision? 0 0  Difficulty concentrating or making decisions? 0 0  Walking or climbing stairs? 0 0  Dressing or bathing? 0 0  Doing errands, shopping? 0 0  Preparing Food and eating ? N N  Using the Toilet? N N  In the past six months, have you accidently leaked urine? N Y  Do you have problems with loss of bowel control? Y N  Comment Wears pads. Followed by Urologist   Managing your Medications? N N  Managing your Finances? N N  Housekeeping or managing your Housekeeping? N N    Patient Care Team: Nelwyn Salisbury, MD as PCP - General Wendall Stade, MD as PCP - Cardiology (Cardiology) Bjorn Pippin, MD as Attending Physician (Urology) [facility] Rachael Fee, MD as Attending Physician (Gastroenterology) Graylin Shiver, MD as Consulting Physician (Otolaryngology) Verner Chol, [facility] (Inactive) as Pharmacist (Pharmacist)  Indicate any recent Medical Services you may have received from other than [facility] providers in the past year (date may be approximate).     Assessment:   This is a routine wellness examination for Javed.  Hearing/Vision screen Hearing Screening - Comments:: Wears hearing aids Vision Screening - Comments:: Wears reading glasses - up to date with routine eye exams with  Dr Dione Booze  Dietary issues and exercise activities discussed: Current Exercise Habits: Home exercise routine, Type of exercise: Other - see comments;walking (Swimming), Time (Minutes): 60, Frequency (Times/Week): 5, Weekly Exercise (Minutes/Week): 300, Intensity: Moderate, Exercise limited by: None identified   Goals Addressed               This Visit's Progress     Patient stated (pt-stated)        I want to  continue to lose weight.       Depression Screen    07/18/2022    9:48 AM 05/20/2022   11:02 AM 11/19/2021    9:03 AM 07/15/2021   10:31 AM 05/18/2021   10:01 AM 11/16/2020   10:51 AM 06/30/2020    8:41 AM  PHQ 2/9 Scores  PHQ - 2 Score 0 0 0 0 0 0 0  PHQ- 9 Score 0 0 0 0 0 1     Fall Risk    07/18/2022    9:51 AM 07/14/2022    9:01 AM 05/20/2022   11:02 AM 01/06/2022    7:13 PM 11/19/2021    9:03 AM  Fall Risk   Falls in the past year? 0 0 0 0 0  Number falls in past yr: 0 0 0  0  Injury with Fall? 0 0 0  0  Risk for fall due to : No Fall Risks  No Fall Risks  No Fall Risks  Follow up Falls prevention discussed  Falls evaluation completed  Falls evaluation completed    FALL RISK PREVENTION PERTAINING TO THE HOME:  Any stairs in or around the home? Yes  If so, are there any without handrails? No  Home free of loose throw rugs in walkways, pet beds, electrical cords, etc? Yes  Adequate lighting in your home to reduce risk of falls? Yes   ASSISTIVE DEVICES UTILIZED TO PREVENT FALLS:  Life alert? No  Use of a cane, walker or w/c? No  Grab bars in the bathroom? No  Shower chair or bench in shower? Yes Elevated toilet seat or a handicapped toilet? Yes  TIMED UP AND GO:  Was the test performed? No . Audio visit   Cognitive Function:        07/18/2022    9:53 AM 07/15/2021   10:37 AM  6CIT Screen  What Year? 0 points 0 points  What month? 0 points 0 points  What time? 0 points 0 points  Count back from 20 0 points 0 points  Months in reverse 0 points 0 points  Repeat phrase 0 points 0 points  Total Score 0 points 0 points    Immunizations Immunization History  Administered Date(s) Administered   Fluad Quad(high Dose 65+) 11/13/2018, 11/14/2019, 11/16/2020, 11/19/2021   Influenza Split 04/14/2011   Influenza Whole 02/18/2009   Influenza, High Dose Seasonal PF 01/01/2013, 11/06/2014, 11/06/2015, 11/08/2016, 11/07/2017   Influenza,inj,Quad PF,6+ Mos 10/29/2013    Moderna Covid-19 Vaccine Bivalent Booster 47yrs & up 12/06/2021   Moderna Sars-Covid-2 Vaccination 04/05/2019, 05/03/2019   Pneumococcal Conjugate-13 06/26/2013   Pneumococcal Polysaccharide-23 05/05/2015   Rsv, Bivalent, Protein Subunit Rsvpref,pf (Abrysvo) 12/06/2021   Td 05/10/2006   Tdap 02/08/2018   Zoster Recombinat (Shingrix) 08/27/2019, 10/30/2019   Zoster, Live 06/13/2011    TDAP status: Up to date  Flu Vaccine status: Up to date  Pneumococcal vaccine status: Up to date  Covid-19 vaccine status: Completed vaccines  Qualifies for Shingles Vaccine? Yes   Zostavax completed Yes   Shingrix Completed?: Yes  Screening Tests Health Maintenance  Topic Date Due   OPHTHALMOLOGY EXAM  07/18/2022 (Originally 02/02/2021)   Diabetic kidney evaluation - Urine ACR  07/19/2022 (Originally 10/28/1963)   COVID-19 Vaccine (4 - 2023-24 season) 08/03/2022 (Originally 01/31/2022)   INFLUENZA VACCINE  10/06/2022   HEMOGLOBIN A1C  11/20/2022   Diabetic kidney evaluation - eGFR measurement  05/20/2023   Medicare Annual Wellness (AWV)  07/18/2023   COLONOSCOPY (Pts 45-64yrs Insurance coverage will need to be confirmed)  07/19/2025   DTaP/Tdap/Td (3 - Td or Tdap) 02/09/2028   Pneumonia Vaccine 46+ Years old  Completed   Zoster Vaccines- Shingrix  Completed   HPV VACCINES  Aged Out   FOOT EXAM  Discontinued    Health Maintenance  There are no preventive care reminders to display for this patient.   Colorectal cancer screening: Type of screening: Colonoscopy. Completed 07/10/15. Repeat every 10 years  Lung Cancer Screening: (Low Dose CT Chest recommended if Age 15-80 years, 30 pack-year currently smoking OR have quit w/in 15years.) does not qualify.     Additional Screening:    Vision Screening: Recommended annual ophthalmology exams for early detection of glaucoma and other disorders of the eye. Is the patient up to date with their annual eye exam?  Yes  Who is the provider or what  is the name of the office in which the patient attends annual eye exams? Dr Dione Booze If pt is not established with a provider, would they like to be referred to  a provider to establish care? No .   Dental Screening: Recommended annual dental exams for proper oral hygiene  Community Resource Referral / Chronic Care Management:  CRR required this visit?  No   CCM required this visit?  No      Plan:     I have personally reviewed and noted the following in the patient's chart:   Medical and social history Use of alcohol, tobacco or illicit drugs  Current medications and supplements including opioid prescriptions. Patient is not currently taking opioid prescriptions. Functional ability and status Nutritional status Physical activity Advanced directives List of other physicians Hospitalizations, surgeries, and ER visits in previous 12 months Vitals Screenings to include cognitive, depression, and falls Referrals and appointments  In addition, I have reviewed and discussed with patient certain preventive protocols, quality metrics, and best practice recommendations. A written personalized care plan for preventive services as well as general preventive health recommendations were provided to patient.     Tillie Rung, LPN   [id number]   Nurse Notes: Patient due Diabetic Kidney evaluation- Urine ACR

## 2022-08-06 ENCOUNTER — Encounter: Payer: Self-pay | Admitting: Cardiovascular Disease

## 2022-08-16 DIAGNOSIS — H0102B Squamous blepharitis left eye, upper and lower eyelids: Secondary | ICD-10-CM | POA: Diagnosis not present

## 2022-08-16 DIAGNOSIS — H43813 Vitreous degeneration, bilateral: Secondary | ICD-10-CM | POA: Diagnosis not present

## 2022-08-16 DIAGNOSIS — H401131 Primary open-angle glaucoma, bilateral, mild stage: Secondary | ICD-10-CM | POA: Diagnosis not present

## 2022-08-16 DIAGNOSIS — H0102A Squamous blepharitis right eye, upper and lower eyelids: Secondary | ICD-10-CM | POA: Diagnosis not present

## 2022-08-16 DIAGNOSIS — Z961 Presence of intraocular lens: Secondary | ICD-10-CM | POA: Diagnosis not present

## 2022-09-05 ENCOUNTER — Other Ambulatory Visit (INDEPENDENT_AMBULATORY_CARE_PROVIDER_SITE_OTHER): Payer: Medicare Other

## 2022-09-05 DIAGNOSIS — R739 Hyperglycemia, unspecified: Secondary | ICD-10-CM | POA: Diagnosis not present

## 2022-09-05 LAB — HEMOGLOBIN A1C: Hgb A1c MFr Bld: 5.8 % (ref 4.6–6.5)

## 2022-09-05 NOTE — Addendum Note (Signed)
Addended by: Marian Sorrow D on: 09/05/2022 10:35 AM   Modules accepted: Orders

## 2022-09-12 NOTE — Progress Notes (Signed)
Reviewed lab results with pt verbalized understanding 

## 2022-10-05 DIAGNOSIS — C61 Malignant neoplasm of prostate: Secondary | ICD-10-CM | POA: Diagnosis not present

## 2022-10-11 DIAGNOSIS — N1832 Chronic kidney disease, stage 3b: Secondary | ICD-10-CM | POA: Diagnosis not present

## 2022-10-12 DIAGNOSIS — L0292 Furuncle, unspecified: Secondary | ICD-10-CM | POA: Diagnosis not present

## 2022-10-12 DIAGNOSIS — N5201 Erectile dysfunction due to arterial insufficiency: Secondary | ICD-10-CM | POA: Diagnosis not present

## 2022-10-12 DIAGNOSIS — N393 Stress incontinence (female) (male): Secondary | ICD-10-CM | POA: Diagnosis not present

## 2022-10-12 DIAGNOSIS — C61 Malignant neoplasm of prostate: Secondary | ICD-10-CM | POA: Diagnosis not present

## 2022-10-17 DIAGNOSIS — N1832 Chronic kidney disease, stage 3b: Secondary | ICD-10-CM | POA: Diagnosis not present

## 2022-10-17 DIAGNOSIS — N179 Acute kidney failure, unspecified: Secondary | ICD-10-CM | POA: Diagnosis not present

## 2022-10-17 DIAGNOSIS — N200 Calculus of kidney: Secondary | ICD-10-CM | POA: Diagnosis not present

## 2022-10-17 DIAGNOSIS — C61 Malignant neoplasm of prostate: Secondary | ICD-10-CM | POA: Diagnosis not present

## 2022-10-17 DIAGNOSIS — N281 Cyst of kidney, acquired: Secondary | ICD-10-CM | POA: Diagnosis not present

## 2022-10-17 DIAGNOSIS — E872 Acidosis, unspecified: Secondary | ICD-10-CM | POA: Diagnosis not present

## 2022-10-17 DIAGNOSIS — I2510 Atherosclerotic heart disease of native coronary artery without angina pectoris: Secondary | ICD-10-CM | POA: Diagnosis not present

## 2022-10-17 DIAGNOSIS — E785 Hyperlipidemia, unspecified: Secondary | ICD-10-CM | POA: Diagnosis not present

## 2022-10-17 DIAGNOSIS — M179 Osteoarthritis of knee, unspecified: Secondary | ICD-10-CM | POA: Diagnosis not present

## 2022-10-17 DIAGNOSIS — I129 Hypertensive chronic kidney disease with stage 1 through stage 4 chronic kidney disease, or unspecified chronic kidney disease: Secondary | ICD-10-CM | POA: Diagnosis not present

## 2022-10-20 ENCOUNTER — Encounter (INDEPENDENT_AMBULATORY_CARE_PROVIDER_SITE_OTHER): Payer: Self-pay

## 2022-11-07 NOTE — Progress Notes (Unsigned)
CARDIOLOGY OFFICE NOTE  Date:  11/08/2022    [name] [name]. Date of Birth: 12/28/1945 Medical Record #[id number]  PCP:  Nelwyn Salisbury, MD  Cardiologist:  Eden Emms  No chief complaint on file.   History of Present Illness: [name] Tenorio. is a 77 y.o. male  has a history of HTN, HLD, DM and CAD. He had prior stenting of the LCX in November of 2019. Did have recurrent pain post intervention and was placed on Imdur. Follow up Myoview 02/2018 non ischemic with EF of 60%. Ended up having repeat cath 03/2018 - to manage medically.  Has had chronic dyspnea since his original PCI. This does not appear to be cardiac in nature with Normal CPX 01/07/19    Post right TKR 07/23/19 [name] No cardiac issues  Seeing Dr Annabell Howells for prostate cancer Has recurrence locally post Rx and under observation with serial PSA   BP has been high Seeing renal doctor now for stage 3 CRF Dr Gerri Lins  Cr 2.06 01/10/2022   2023 Went to [employer] to swim with [place]  He is ex [employer] and fought in [place]  Had back pain with left renal stone 01/09/22   No angina  Discussed f/u ETT for HR response and his CAD Last cath was 04/06/18  Norvasc dose lowered due to LE edema HR/BP are fine at home Discussed low HR and possible need for PPM in future ECG stable  He has lost 45 lbs feels great Dyspnea gone. Discussed cutting Micardis down to 40 mg daily He has stopped norvasc.   Past Medical History:  Diagnosis Date   ABNORMAL EKG 10/07/2008   ADVEF, DRUG/MED/BIOL SUBST, OTHER DRUG NOS 09/26/2006   Agent orange exposure    Allergy    seasonal   Cataract    left eye-surgery 07-2015   DEGENERATIVE JOINT DISEASE, MODERATE 04/13/2007   DIVERTICULOSIS, COLON 04/13/2007   ELEVATED PROSTATE SPECIFIC ANTIGEN 04/14/2007   GERD (gastroesophageal reflux disease)    Glaucoma    sees Dr. Dione Booze    History of kidney stones    1970    HYPERLIPIDEMIA 09/26/2006   HYPERTENSION 04/13/2007   JOINT EFFUSION, KNEE 06/15/2009   Melanoma  (HCC)    sees Dr. Nita Sells    Numbness and tingling    Pre-diabetes    PROSTATE CANCER 10/05/2009   sees Dr. Annabell Howells   Renal insufficiency    stage III [facility]     Past Surgical History:  Procedure Laterality Date   ACROMIOPLASTY Right 05-06-14   per Dr. Daivd Council     bilateral   CATARACT EXTRACTION Left    per Dr. Dione Booze    COLONOSCOPY  07/10/2015   per Dr. Christella Hartigan, benign polyp, repeat in 10 yrs    CORONARY STENT INTERVENTION N/A 01/17/2018   Procedure: CORONARY STENT INTERVENTION;  Surgeon: Marykay Lex, MD;  Location: [facility] INVASIVE CV LAB;  Service: Cardiovascular;  Laterality: N/A;   CYSTECTOMY     benign from hand   GLAUCOMA SURGERY Bilateral 2014    heart stent     INSERTION OF MESH N/A 01/10/2013   Procedure: INSERTION OF MESH;  Surgeon: Velora Heckler, MD;  Location: [facility] OR;  Service: General;  Laterality: N/A;   KNEE ARTHROSCOPY Right Jan. 2013   right knee, per Dr. [name]    KNEE ARTHROSCOPY Left 01-14-15   per Dr. [name]    LEFT HEART CATH AND CORONARY ANGIOGRAPHY  N/A 01/17/2018   Procedure: LEFT HEART CATH AND CORONARY ANGIOGRAPHY;  Surgeon: Marykay Lex, MD;  Location: [facility] INVASIVE CV LAB;  Service: Cardiovascular;  Laterality: N/A;   LEFT HEART CATH AND CORONARY ANGIOGRAPHY N/A 04/06/2018   Procedure: LEFT HEART CATH AND CORONARY ANGIOGRAPHY;  Surgeon: Lyn Records, MD;  Location: [facility] INVASIVE CV LAB;  Service: Cardiovascular;  Laterality: N/A;   MELANOMA EXCISION  2012   per Dr. Nita Sells   PROSTATECTOMY     per Dr. Annabell Howells   TONSILLECTOMY     TOTAL KNEE ARTHROPLASTY Right 07/22/2019   Procedure: TOTAL KNEE ARTHROPLASTY;  Surgeon: Beverely Low, MD;  Location: [facility] ORS;  Service: Orthopedics;  Laterality: Right;   TOTAL KNEE ARTHROPLASTY Left 07/10/2020   Procedure: TOTAL KNEE ARTHROPLASTY;  Surgeon: Beverely Low, MD;  Location: [facility] ORS;  Service: Orthopedics;  Laterality: Left;   UMBILICAL HERNIA REPAIR  11/01/2011   Procedure: HERNIA REPAIR  UMBILICAL ADULT;  Surgeon: Velora Heckler, MD;  Location: [facility];  Service: General;  Laterality: N/A;  Repair umbilical hernia with mesh patch   VASECTOMY     VENTRAL HERNIA REPAIR  01/10/2013   with mesh     Dr Eleonore Chiquito HERNIA REPAIR N/A 01/10/2013   Procedure: HERNIA REPAIR VENTRAL ADULT ;  Surgeon: Velora Heckler, MD;  Location: [facility] OR;  Service: General;  Laterality: N/A;     Medications: Current Meds  Medication Sig   aspirin EC 81 MG tablet Take 81 mg by mouth daily. Swallow whole.   bimatoprost (LUMIGAN) 0.01 % SOLN Place 1 drop into both eyes at bedtime.   clopidogrel (PLAVIX) 75 MG tablet Take 1 tablet (75 mg total) by mouth daily with breakfast. (Patient taking differently: Take 75 mg by mouth daily.)   dorzolamide-timolol (COSOPT) 22.3-6.8 MG/ML ophthalmic solution Place 1 drop into both eyes 2 (two) times daily.    Ergocalciferol (VITAMIN D2) 50 MCG (2000 UT) TABS Take 2,000 Units by mouth daily.   famotidine (PEPCID) 20 MG tablet Take 20 mg by mouth at bedtime.    fenofibrate 160 MG tablet Take 160 mg by mouth at bedtime.    fluticasone (FLONASE) 50 MCG/ACT nasal spray Place 1 spray into both nostrils daily as needed for allergies.   isosorbide mononitrate (IMDUR) 30 MG 24 hr tablet Take 30 mg by mouth daily.   magnesium oxide (MAG-OX) 400 MG tablet Take 400 mg by mouth every evening.    nitroGLYCERIN (NITROSTAT) 0.4 MG SL tablet Place 0.4 mg under the tongue every 5 (five) minutes as needed for chest pain.   pantoprazole (PROTONIX) 40 MG tablet Take 1 tablet (40 mg total) by mouth daily.   rosuvastatin (CRESTOR) 40 MG tablet Take 40 mg by mouth daily.   telmisartan (MICARDIS) 80 MG tablet Take 1 tablet (80 mg total) by mouth daily.     Allergies: No Known Allergies  Social History: The patient  reports that he quit smoking about 50 years ago. His smoking use included cigarettes. He started smoking about 59 years ago. He has a 9 pack-year smoking  history. He has never used smokeless tobacco. He reports current alcohol use. He reports that he does not use drugs.   Family History: The patient's family history includes Diabetes in his mother; Heart failure in his father; Prostate cancer in his father; Stroke in his mother.   Review of Systems: Please see the history of present illness.   All other systems are reviewed  and negative.   Physical Exam: VS:  BP (!) 146/60 (BP Location: Left Arm, Patient Position: Sitting, Cuff Size: Normal)   Pulse (!) 51   Ht 5\' 6"  (1.676 m)   Wt 166 lb 12.8 oz (75.7 kg)   SpO2 99%   BMI 26.92 kg/m  .  BMI Body mass index is 26.92 kg/m.  Wt Readings from Last 3 Encounters:  11/08/22 166 lb 12.8 oz (75.7 kg)  07/18/22 182 lb (82.6 kg)  05/23/22 204 lb (92.5 kg)   Affect appropriate Healthy:  appears stated age HEENT: normal Neck supple with no adenopathy JVP normal no bruits no thyromegaly Lungs clear with no wheezing and good diaphragmatic motion Heart:  S1/S2 no murmur, no rub, gallop or click PMI normal Abdomen: benighn, BS positve, no tenderness, no AAA no bruit.  No HSM or HJR Distal pulses intact with no bruits No edema Neuro non-focal Skin warm and dry Post right TKR     LABORATORY DATA:  EKG:  11/08/2022 NSR rate 48 PR 228 msec nonspecific ST changes    Lab Results  Component Value Date   WBC 8.3 05/20/2022   HGB 14.8 05/20/2022   HCT 43.7 05/20/2022   PLT 212.0 05/20/2022   GLUCOSE 115 (H) 05/20/2022   CHOL 156 05/20/2022   TRIG 143.0 05/20/2022   HDL 45.50 05/20/2022   LDLDIRECT 82.1 04/07/2011   LDLCALC 82 05/20/2022   ALT 20 01/10/2022   AST 25 01/10/2022   NA 141 05/20/2022   K 4.2 05/20/2022   CL 109 05/20/2022   CREATININE 1.69 (H) 05/20/2022   BUN 25 (H) 05/20/2022   CO2 22 05/20/2022   TSH 3.22 11/19/2021   PSA 0.38 11/06/2015   HGBA1C 5.8 09/05/2022     Other Studies Reviewed Today:  Cath 04/06/18   Conclusion    Widely patent mid circumflex  drug-eluting stent.  The circumflex beyond the stent is unchanged from before with a 50 to 70% distal bifurcation stenosis in a [place] 110 configuration.  The circumflex is otherwise patent. Left main is normal. LAD is widely patent.  A branch of the first diagonal contains ostial 80% narrowing.  This tertiary branches are small. RCA is widely patent with distal PDA and LV branch diffuse disease. Normal LV function and LVEDP 10 mmHg.   RECOMMENDATIONS:   It is conceivable that angina is due to the very distal bifurcation disease in the circumflex.  It does not appear to be angiographically severe.  Stent implantation would include crossing over an equally large side branch.  Considering the stability, complexity of an interventional procedure (with risk of side branch occlusion), and the far distal location, recommend continued conservative medical management possibly adding either Ranexa or beta-blocker therapy to the regimen.        ASSESSMENT AND PLAN:   1. CAD: 01/17/18 stent to the prox to mid Cx Single vessel disease with good result. persistent symptoms F/U myovue done 03/01/18 low risk with no ischemia EF 60%   Repeat Cath 04/06/18 with widely patent stent with medical Rx recommended  Discussed ETT/myovue  since he does not get classic angina    2. Ortho:  Post right TKR Norris 07/23/19    3. Hypertension: improved with weight loss. D/C Norvasc Cut Micardis down to 40 mg daily   4. HLD:  continue fibrates and statin  crestor increased by primary on 01/08/21 for LDL 88    5. GERD -  Low carb diet protonix   6.  DM -  Discussed low carb diet.  Target hemoglobin A1c is 6.5 or less.  Continue current medications. A1c 6.7 11/16/20   7. Chronic dyspnea -  CPX 01/07/19 with no cardiopulmonary limitations Stopped due to ventilatory limit due to body habitus  Now better with weight loss   8. Obesity - see above.   9. Prostate Cancer:  F/u Dr Annabell Howells has recurrence in prostate bed likely PET  negative 06/30/20   Current medicines are reviewed with the patient today.  The patient does not have concerns regarding medicines other than what has been noted above.  The following changes have been made:  See above.  Labs/ tests ordered today include:    Ex Myovue Decrease Micardis 40 mg daily Norvasc d/c    Disposition:   FU in 6 months  Signed: Charlton Haws, MD  11/08/2022 8:42 AM  [facility] [address] [place], [place]  [address] Phone: [phone] Fax: [phone]

## 2022-11-08 ENCOUNTER — Encounter (HOSPITAL_COMMUNITY): Payer: Self-pay | Admitting: *Deleted

## 2022-11-08 ENCOUNTER — Ambulatory Visit: Payer: Medicare Other | Attending: Cardiovascular Disease | Admitting: Cardiovascular Disease

## 2022-11-08 ENCOUNTER — Encounter: Payer: Self-pay | Admitting: Cardiovascular Disease

## 2022-11-08 ENCOUNTER — Encounter: Payer: Self-pay | Admitting: *Deleted

## 2022-11-08 VITALS — BP 146/60 | HR 51 | Ht 66.0 in | Wt 166.8 lb

## 2022-11-08 DIAGNOSIS — R001 Bradycardia, unspecified: Secondary | ICD-10-CM | POA: Diagnosis not present

## 2022-11-08 DIAGNOSIS — I2510 Atherosclerotic heart disease of native coronary artery without angina pectoris: Secondary | ICD-10-CM | POA: Insufficient documentation

## 2022-11-08 MED ORDER — TELMISARTAN 40 MG PO TABS: 40.0000 mg | ORAL_TABLET | Freq: Every day | ORAL | 2 refills | Status: DC

## 2022-11-08 NOTE — Patient Instructions (Addendum)
Medication Instructions:   DECREASE YOUR TELMISARTAN (MICARDIS) TO 40 MG BY MOUTH DAILY  *If you need a refill on your cardiac medications before your next appointment, please call your pharmacy*    Testing/Procedures:  Your physician has requested that you have en exercise stress myoview. For further information please visit [email or web address] Please follow instruction sheet, as given.    Follow-Up: At [facility], you and your health needs are our priority.  As part of our continuing mission to provide you with exceptional heart care, we have created designated Provider Care Teams.  These Care Teams include your primary Cardiologist (physician) and Advanced Practice Providers (APPs -  Physician Assistants and Nurse Practitioners) who all work together to provide you with the care you need, when you need it.  We recommend signing up for the patient portal called "MyChart".  Sign up information is provided on this After Visit Summary.  MyChart is used to connect with patients for Virtual Visits (Telemedicine).  Patients are able to view lab/test results, encounter notes, upcoming appointments, etc.  Non-urgent messages can be sent to your provider as well.   To learn more about what you can do with MyChart, go to [email or web address].    Your next appointment:   6 month(s)  Provider:   Charlton Haws, MD

## 2022-11-08 NOTE — Telephone Encounter (Signed)
Letter sent to my chart with instructions for upcoming stress test.  [name], [name] [name]  

## 2022-11-16 ENCOUNTER — Ambulatory Visit (HOSPITAL_COMMUNITY): Payer: Medicare Other | Attending: Cardiovascular Disease

## 2022-11-16 VITALS — Ht 66.0 in | Wt 166.0 lb

## 2022-11-16 DIAGNOSIS — I2510 Atherosclerotic heart disease of native coronary artery without angina pectoris: Secondary | ICD-10-CM | POA: Insufficient documentation

## 2022-11-16 DIAGNOSIS — R001 Bradycardia, unspecified: Secondary | ICD-10-CM | POA: Insufficient documentation

## 2022-11-16 LAB — MYOCARDIAL PERFUSION IMAGING
Angina Index: 0
Duke Treadmill Score: 8
Estimated workload: 10.1
Exercise duration (min): 8 min
Exercise duration (sec): 0 s
LV dias vol: 81 mL (ref 62–150)
LV sys vol: 26 mL
MPHR: 145 {beats}/min
Nuc Stress EF: 68 %
Peak HR: 130 {beats}/min
Percent HR: 89 %
Rest HR: 54 {beats}/min
Rest Nuclear Isotope Dose: 10.1 mCi
SDS: 2
SRS: 0
SSS: 2
ST Depression (mm): 0 mm
Stress Nuclear Isotope Dose: 32.2 mCi
TID: 0.85

## 2022-11-16 MED ORDER — TECHNETIUM TC 99M TETROFOSMIN IV KIT
10.1000 | PACK | Freq: Once | INTRAVENOUS | Status: AC | PRN
  Administered 2022-11-16: 10.1 via INTRAVENOUS

## 2022-11-16 MED ORDER — TECHNETIUM TC 99M TETROFOSMIN IV KIT
32.2000 | PACK | Freq: Once | INTRAVENOUS | Status: AC | PRN
  Administered 2022-11-16: 32.2 via INTRAVENOUS

## 2022-11-17 DIAGNOSIS — Z1283 Encounter for screening for malignant neoplasm of skin: Secondary | ICD-10-CM | POA: Diagnosis not present

## 2022-11-17 DIAGNOSIS — D225 Melanocytic nevi of trunk: Secondary | ICD-10-CM | POA: Diagnosis not present

## 2022-11-17 DIAGNOSIS — Z85820 Personal history of malignant melanoma of skin: Secondary | ICD-10-CM | POA: Diagnosis not present

## 2022-11-17 DIAGNOSIS — Z08 Encounter for follow-up examination after completed treatment for malignant neoplasm: Secondary | ICD-10-CM | POA: Diagnosis not present

## 2022-11-17 DIAGNOSIS — D485 Neoplasm of uncertain behavior of skin: Secondary | ICD-10-CM | POA: Diagnosis not present

## 2022-11-17 DIAGNOSIS — D2262 Melanocytic nevi of left upper limb, including shoulder: Secondary | ICD-10-CM | POA: Diagnosis not present

## 2022-11-21 ENCOUNTER — Encounter: Payer: Medicare Other | Admitting: Family Medicine

## 2022-11-25 DIAGNOSIS — N393 Stress incontinence (female) (male): Secondary | ICD-10-CM | POA: Diagnosis not present

## 2022-11-29 ENCOUNTER — Encounter: Payer: Self-pay | Admitting: Family Medicine

## 2022-11-29 ENCOUNTER — Ambulatory Visit (INDEPENDENT_AMBULATORY_CARE_PROVIDER_SITE_OTHER): Payer: Medicare Other | Admitting: Family Medicine

## 2022-11-29 VITALS — BP 120/64 | HR 47 | Temp 98.2°F | Wt 166.2 lb

## 2022-11-29 DIAGNOSIS — I10 Essential (primary) hypertension: Secondary | ICD-10-CM | POA: Diagnosis not present

## 2022-11-29 DIAGNOSIS — E1122 Type 2 diabetes mellitus with diabetic chronic kidney disease: Secondary | ICD-10-CM | POA: Diagnosis not present

## 2022-11-29 DIAGNOSIS — E785 Hyperlipidemia, unspecified: Secondary | ICD-10-CM

## 2022-11-29 DIAGNOSIS — M159 Polyosteoarthritis, unspecified: Secondary | ICD-10-CM

## 2022-11-29 DIAGNOSIS — N1831 Chronic kidney disease, stage 3a: Secondary | ICD-10-CM | POA: Diagnosis not present

## 2022-11-29 DIAGNOSIS — Z23 Encounter for immunization: Secondary | ICD-10-CM

## 2022-11-29 DIAGNOSIS — I2510 Atherosclerotic heart disease of native coronary artery without angina pectoris: Secondary | ICD-10-CM

## 2022-11-29 DIAGNOSIS — C61 Malignant neoplasm of prostate: Secondary | ICD-10-CM

## 2022-11-29 LAB — LIPID PANEL
Cholesterol: 131 mg/dL (ref 0–200)
HDL: 54 mg/dL (ref >39.00)
LDL Cholesterol: 64 mg/dL (ref 0–99)
NonHDL: 77.11
Total CHOL/HDL Ratio: 2
Triglycerides: 66 mg/dL (ref 0.0–149.0)
VLDL: 13.2 mg/dL (ref 0.0–40.0)

## 2022-11-29 LAB — BASIC METABOLIC PANEL
BUN: 23 mg/dL (ref 6–23)
CO2: 26 mEq/L (ref 19–32)
Calcium: 9.5 mg/dL (ref 8.4–10.5)
Chloride: 110 mEq/L (ref 96–112)
Creatinine, Ser: 1.67 mg/dL — ABNORMAL HIGH (ref 0.40–1.50)
GFR: 39.91 mL/min — ABNORMAL LOW (ref >60.00)
Glucose, Bld: 94 mg/dL (ref 70–99)
Potassium: 4.1 mEq/L (ref 3.5–5.1)
Sodium: 142 mEq/L (ref 135–145)

## 2022-11-29 LAB — CBC WITH DIFFERENTIAL/PLATELET
Basophils Absolute: 0.1 10*3/uL (ref 0.0–0.1)
Basophils Relative: 1.7 % (ref 0.0–3.0)
Eosinophils Absolute: 0.1 10*3/uL (ref 0.0–0.7)
Eosinophils Relative: 1.6 % (ref 0.0–5.0)
HCT: 43.3 % (ref 39.0–52.0)
Hemoglobin: 14.1 g/dL (ref 13.0–17.0)
Lymphocytes Relative: 23.7 % (ref 12.0–46.0)
Lymphs Abs: 1.6 10*3/uL (ref 0.7–4.0)
MCHC: 32.5 g/dL (ref 30.0–36.0)
MCV: 90.5 fl (ref 78.0–100.0)
Monocytes Absolute: 0.6 10*3/uL (ref 0.1–1.0)
Monocytes Relative: 8.4 % (ref 3.0–12.0)
Neutro Abs: 4.4 10*3/uL (ref 1.4–7.7)
Neutrophils Relative %: 64.6 % (ref 43.0–77.0)
Platelets: 199 10*3/uL (ref 150.0–400.0)
RBC: 4.78 Mil/uL (ref 4.22–5.81)
RDW: 14.8 % (ref 11.5–15.5)
WBC: 6.8 10*3/uL (ref 4.0–10.5)

## 2022-11-29 LAB — HEPATIC FUNCTION PANEL
ALT: 13 U/L (ref 0–53)
AST: 18 U/L (ref 0–37)
Albumin: 4.2 g/dL (ref 3.5–5.2)
Alkaline Phosphatase: 40 U/L (ref 39–117)
Bilirubin, Direct: 0.2 mg/dL (ref 0.0–0.3)
Total Bilirubin: 0.6 mg/dL (ref 0.2–1.2)
Total Protein: 6.5 g/dL (ref 6.0–8.3)

## 2022-11-29 LAB — HEMOGLOBIN A1C: Hgb A1c MFr Bld: 6 % (ref 4.6–6.5)

## 2022-11-29 LAB — TSH: TSH: 2.81 u[IU]/mL (ref 0.35–5.50)

## 2022-11-29 NOTE — Progress Notes (Signed)
Subjective:    Patient ID: [name] [name]., male    DOB: 1945-07-16, 77 y.o.   MRN: [id number]  HPI Here to follow up on issues. He feels well and has no concerns. He has lost 45 lbs over the past 6 months, and he has more energy than ever. He saw [name] [name] in August, and his renal function is stable. He saw [name] [name] a few weeks ago, and he was able to decrease the doses of several of [name] [name]'s HTN medications. His OA is stable. He swims and uses his treadmill every day. He sees [name] [name] frequently to follow his prostate cancer. The PSA continues to climb very slowly, but they are simply monitoring this for now.    Review of Systems  Constitutional: Negative.   HENT: Negative.    Eyes: Negative.   Respiratory: Negative.    Cardiovascular: Negative.   Gastrointestinal: Negative.   Genitourinary: Negative.   Musculoskeletal: Negative.   Skin: Negative.   Neurological: Negative.   Psychiatric/Behavioral: Negative.         Objective:   Physical Exam Constitutional:      General: He is not in acute distress.    Appearance: Normal appearance. He is well-developed. He is not diaphoretic.  HENT:     Head: Normocephalic and atraumatic.     Right Ear: External ear normal.     Left Ear: External ear normal.     Nose: Nose normal.     Mouth/Throat:     Pharynx: No oropharyngeal exudate.  Eyes:     General: No scleral icterus.       Right eye: No discharge.        Left eye: No discharge.     Conjunctiva/sclera: Conjunctivae normal.     Pupils: Pupils are equal, round, and reactive to light.  Neck:     Thyroid: No thyromegaly.     Vascular: No JVD.     Trachea: No tracheal deviation.  Cardiovascular:     Rate and Rhythm: Normal rate and regular rhythm.     Pulses: Normal pulses.     Heart sounds: Normal heart sounds. No murmur heard.    No friction rub. No gallop.  Pulmonary:     Effort: Pulmonary effort is normal. No respiratory distress.     Breath sounds: Normal  breath sounds. No wheezing or rales.  Chest:     Chest wall: No tenderness.  Abdominal:     General: Bowel sounds are normal. There is no distension.     Palpations: Abdomen is soft. There is no mass.     Tenderness: There is no abdominal tenderness. There is no guarding or rebound.  Genitourinary:    Penis: No tenderness.   Musculoskeletal:        General: No tenderness. Normal range of motion.     Cervical back: Neck supple.  Lymphadenopathy:     Cervical: No cervical adenopathy.  Skin:    General: Skin is warm and dry.     Coloration: Skin is not pale.     Findings: No erythema or rash.  Neurological:     General: No focal deficit present.     Mental Status: He is alert and oriented to person, place, and time.     Cranial Nerves: No cranial nerve deficit.     Motor: No abnormal muscle tone.     Coordination: Coordination normal.     Deep Tendon Reflexes: Reflexes are normal and symmetric. Reflexes normal.  Psychiatric:        Mood and Affect: Mood normal.        Behavior: Behavior normal.        Thought Content: Thought content normal.        Judgment: Judgment normal.           Assessment & Plan:  His HTN and CAD are stable. His CKD is stable. His diabetes seems to be stable. His OA is well controlled. Get fasting labs to check lipids, A1c, etc. We spent a total of (34   ) minutes reviewing records and discussing these issues.  Gershon Crane, MD

## 2023-02-20 DIAGNOSIS — D225 Melanocytic nevi of trunk: Secondary | ICD-10-CM | POA: Diagnosis not present

## 2023-02-20 DIAGNOSIS — Z08 Encounter for follow-up examination after completed treatment for malignant neoplasm: Secondary | ICD-10-CM | POA: Diagnosis not present

## 2023-02-20 DIAGNOSIS — Z1283 Encounter for screening for malignant neoplasm of skin: Secondary | ICD-10-CM | POA: Diagnosis not present

## 2023-02-20 DIAGNOSIS — Z85820 Personal history of malignant melanoma of skin: Secondary | ICD-10-CM | POA: Diagnosis not present

## 2023-02-21 DIAGNOSIS — H0102A Squamous blepharitis right eye, upper and lower eyelids: Secondary | ICD-10-CM | POA: Diagnosis not present

## 2023-02-21 DIAGNOSIS — H0102B Squamous blepharitis left eye, upper and lower eyelids: Secondary | ICD-10-CM | POA: Diagnosis not present

## 2023-02-21 DIAGNOSIS — H401131 Primary open-angle glaucoma, bilateral, mild stage: Secondary | ICD-10-CM | POA: Diagnosis not present

## 2023-02-21 DIAGNOSIS — Z961 Presence of intraocular lens: Secondary | ICD-10-CM | POA: Diagnosis not present

## 2023-02-21 DIAGNOSIS — H43813 Vitreous degeneration, bilateral: Secondary | ICD-10-CM | POA: Diagnosis not present

## 2023-02-21 LAB — HM DIABETES EYE EXAM

## 2023-03-10 ENCOUNTER — Ambulatory Visit (INDEPENDENT_AMBULATORY_CARE_PROVIDER_SITE_OTHER): Payer: Medicare Other | Admitting: Family Medicine

## 2023-03-10 ENCOUNTER — Encounter: Payer: Self-pay | Admitting: Family Medicine

## 2023-03-10 VITALS — BP 118/60 | HR 50 | Temp 98.0°F | Wt 171.6 lb

## 2023-03-10 DIAGNOSIS — K439 Ventral hernia without obstruction or gangrene: Secondary | ICD-10-CM | POA: Diagnosis not present

## 2023-03-10 NOTE — Progress Notes (Signed)
   Subjective:    Patient ID: [name] [name]., male    DOB: [birth year], 78 y.o.   MRN: [id number]  HPI Here for what he thinks is another hernia. In 2014 he had a ventral hernia repaired by Dr. Eletha, and this has done well. Now about a few weeks ago he noticed a small lump in the center his abdomen just above the umbilicus. There is no pain. His BM's are regular.    Review of Systems  Constitutional: Negative.   Respiratory: Negative.    Cardiovascular: Negative.   Gastrointestinal:  Negative for abdominal distention, abdominal pain, blood in stool, constipation, diarrhea, nausea and vomiting.  Genitourinary: Negative.        Objective:   Physical Exam Constitutional:      Appearance: Normal appearance.  Cardiovascular:     Rate and Rhythm: Normal rate and regular rhythm.     Pulses: Normal pulses.     Heart sounds: Normal heart sounds.  Pulmonary:     Effort: Pulmonary effort is normal.     Breath sounds: Normal breath sounds.  Abdominal:     General: Abdomen is flat. Bowel sounds are normal. There is no distension.     Palpations: Abdomen is soft.     Tenderness: There is no abdominal tenderness. There is no guarding or rebound.     Comments: There is a small easily reducible non-tender ventral hernia just superior to the umbilicus. This is superior to his previous hernia repair site.   Neurological:     Mental Status: He is alert.           Assessment & Plan:  Ventral hernia. Refer back to Surgery to evaluate. Garnette Olmsted, MD

## 2023-04-04 DIAGNOSIS — K432 Incisional hernia without obstruction or gangrene: Secondary | ICD-10-CM | POA: Diagnosis not present

## 2023-04-17 DIAGNOSIS — N1832 Chronic kidney disease, stage 3b: Secondary | ICD-10-CM | POA: Diagnosis not present

## 2023-04-24 ENCOUNTER — Other Ambulatory Visit (HOSPITAL_COMMUNITY): Payer: Self-pay | Admitting: Nephrology

## 2023-04-24 DIAGNOSIS — I129 Hypertensive chronic kidney disease with stage 1 through stage 4 chronic kidney disease, or unspecified chronic kidney disease: Secondary | ICD-10-CM | POA: Diagnosis not present

## 2023-04-24 DIAGNOSIS — N1832 Chronic kidney disease, stage 3b: Secondary | ICD-10-CM

## 2023-04-24 DIAGNOSIS — E872 Acidosis, unspecified: Secondary | ICD-10-CM | POA: Diagnosis not present

## 2023-04-24 DIAGNOSIS — E785 Hyperlipidemia, unspecified: Secondary | ICD-10-CM | POA: Diagnosis not present

## 2023-04-24 DIAGNOSIS — M179 Osteoarthritis of knee, unspecified: Secondary | ICD-10-CM | POA: Diagnosis not present

## 2023-04-24 DIAGNOSIS — N281 Cyst of kidney, acquired: Secondary | ICD-10-CM | POA: Diagnosis not present

## 2023-04-24 DIAGNOSIS — I2510 Atherosclerotic heart disease of native coronary artery without angina pectoris: Secondary | ICD-10-CM | POA: Diagnosis not present

## 2023-04-24 DIAGNOSIS — N200 Calculus of kidney: Secondary | ICD-10-CM | POA: Diagnosis not present

## 2023-04-24 DIAGNOSIS — C61 Malignant neoplasm of prostate: Secondary | ICD-10-CM | POA: Diagnosis not present

## 2023-04-24 DIAGNOSIS — N179 Acute kidney failure, unspecified: Secondary | ICD-10-CM | POA: Diagnosis not present

## 2023-05-01 DIAGNOSIS — C61 Malignant neoplasm of prostate: Secondary | ICD-10-CM | POA: Diagnosis not present

## 2023-05-04 NOTE — Progress Notes (Signed)
 CARDIOLOGY OFFICE NOTE  Date:  05/12/2023    [name] [name]. Date of Birth: Apr 02, 1945 Medical Record #[id number]  PCP:  Nelwyn Salisbury, MD  Cardiologist:  Eden Emms  No chief complaint on file.   History of Present Illness: [name] [name]. is a 78 y.o. male  has a history of HTN, HLD, DM and CAD. He had prior stenting of the LCX in November of 2019. Did have recurrent pain post intervention and was placed on Imdur. Follow up Myoview 02/2018 non ischemic with EF of 60%. Ended up having repeat cath 03/2018 - to manage medically.  Has had chronic dyspnea since his original PCI. This does not appear to be cardiac in nature with Normal CPX 01/07/19    Post right TKR 07/23/19 [name] No cardiac issues  Seeing Dr Annabell Howells for prostate cancer Has recurrence locally post Rx and under observation with serial PSA   BP has been high Seeing renal doctor now for stage 3 CRF Dr Gerri Lins  Cr 2.06 01/10/2022   2023 Went to [employer] to swim with [place]  He is ex [employer] and fought in [place]  Had back pain with left renal stone 01/09/22   No angina  Discussed f/u ETT for HR response and his CAD Last cath was 04/06/18  Norvasc dose lowered due to LE edema HR/BP are fine at home Discussed low HR and possible need for PPM in future ECG stable  He has lost 45 lbs feels great Dyspnea gone. Stopped Norvasc and cut Micardis back with weight loss Has a recurrent ventral hernia and has been referred back to general surgery   Feels great Getting PET/Scan for elevated PSA Had prostatectomy for aggressive cancer in past with XRT x 2 Sees Dr Annabell Howells   Past Medical History:  Diagnosis Date   ABNORMAL EKG 10/07/2008   ADVEF, DRUG/MED/BIOL SUBST, OTHER DRUG NOS 09/26/2006   Agent orange exposure    Allergy    seasonal   Cataract    left eye-surgery 07-2015   DEGENERATIVE JOINT DISEASE, MODERATE 04/13/2007   DIVERTICULOSIS, COLON 04/13/2007   ELEVATED PROSTATE SPECIFIC ANTIGEN 04/14/2007   GERD (gastroesophageal  reflux disease)    Glaucoma    sees Dr. Dione Booze    History of kidney stones    1970    HYPERLIPIDEMIA 09/26/2006   HYPERTENSION 04/13/2007   JOINT EFFUSION, KNEE 06/15/2009   Melanoma (HCC)    sees Dr. Nita Sells    Numbness and tingling    Pre-diabetes    PROSTATE CANCER 10/05/2009   sees Dr. Annabell Howells   Renal insufficiency    stage III [facility]     Past Surgical History:  Procedure Laterality Date   ACROMIOPLASTY Right 05-06-14   per Dr. Daivd Council     bilateral   CATARACT EXTRACTION Left    per Dr. Dione Booze    COLONOSCOPY  07/10/2015   per Dr. Christella Hartigan, benign polyp, repeat in 10 yrs    CORONARY STENT INTERVENTION N/A 01/17/2018   Procedure: CORONARY STENT INTERVENTION;  Surgeon: Marykay Lex, MD;  Location: [facility] INVASIVE CV LAB;  Service: Cardiovascular;  Laterality: N/A;   CYSTECTOMY     benign from hand   GLAUCOMA SURGERY Bilateral 2014    heart stent     INSERTION OF MESH N/A 01/10/2013   Procedure: INSERTION OF MESH;  Surgeon: Velora Heckler, MD;  Location: [facility] OR;  Service: General;  Laterality: N/A;  KNEE ARTHROSCOPY Right Jan. 2013   right knee, per Dr. [name]    KNEE ARTHROSCOPY Left 01-14-15   per Dr. [name]    LEFT HEART CATH AND CORONARY ANGIOGRAPHY N/A 01/17/2018   Procedure: LEFT HEART CATH AND CORONARY ANGIOGRAPHY;  Surgeon: Marykay Lex, MD;  Location: [facility] INVASIVE CV LAB;  Service: Cardiovascular;  Laterality: N/A;   LEFT HEART CATH AND CORONARY ANGIOGRAPHY N/A 04/06/2018   Procedure: LEFT HEART CATH AND CORONARY ANGIOGRAPHY;  Surgeon: Lyn Records, MD;  Location: [facility] INVASIVE CV LAB;  Service: Cardiovascular;  Laterality: N/A;   MELANOMA EXCISION  2012   per Dr. Nita Sells   PROSTATECTOMY     per Dr. Annabell Howells   TONSILLECTOMY     TOTAL KNEE ARTHROPLASTY Right 07/22/2019   Procedure: TOTAL KNEE ARTHROPLASTY;  Surgeon: Beverely Low, MD;  Location: [facility] ORS;  Service: Orthopedics;  Laterality: Right;   TOTAL KNEE ARTHROPLASTY Left 07/10/2020   Procedure:  TOTAL KNEE ARTHROPLASTY;  Surgeon: Beverely Low, MD;  Location: [facility] ORS;  Service: Orthopedics;  Laterality: Left;   UMBILICAL HERNIA REPAIR  11/01/2011   Procedure: HERNIA REPAIR UMBILICAL ADULT;  Surgeon: Velora Heckler, MD;  Location: [facility];  Service: General;  Laterality: N/A;  Repair umbilical hernia with mesh patch   VASECTOMY     VENTRAL HERNIA REPAIR  01/10/2013   with mesh     Dr Eleonore Chiquito HERNIA REPAIR N/A 01/10/2013   Procedure: HERNIA REPAIR VENTRAL ADULT ;  Surgeon: Velora Heckler, MD;  Location: [facility] OR;  Service: General;  Laterality: N/A;     Medications: Current Meds  Medication Sig   telmisartan (MICARDIS) 80 MG tablet Take 80 mg by mouth daily.   aspirin EC 81 MG tablet Take 81 mg by mouth daily. Swallow whole.   bimatoprost (LUMIGAN) 0.01 % SOLN Place 1 drop into both eyes at bedtime.   clopidogrel (PLAVIX) 75 MG tablet Take 1 tablet (75 mg total) by mouth daily with breakfast. (Patient taking differently: Take 75 mg by mouth daily.)   dorzolamide-timolol (COSOPT) 22.3-6.8 MG/ML ophthalmic solution Place 1 drop into both eyes 2 (two) times daily.    Ergocalciferol (VITAMIN D2) 50 MCG (2000 UT) TABS Take 2,000 Units by mouth daily.   famotidine (PEPCID) 20 MG tablet Take 20 mg by mouth at bedtime.    fenofibrate 160 MG tablet Take 160 mg by mouth at bedtime.    fluticasone (FLONASE) 50 MCG/ACT nasal spray Place 1 spray into both nostrils daily as needed for allergies.   isosorbide mononitrate (IMDUR) 30 MG 24 hr tablet Take 30 mg by mouth daily.   magnesium oxide (MAG-OX) 400 MG tablet Take 400 mg by mouth every evening.    nitroGLYCERIN (NITROSTAT) 0.4 MG SL tablet Place 0.4 mg under the tongue every 5 (five) minutes as needed for chest pain.   pantoprazole (PROTONIX) 40 MG tablet Take 1 tablet (40 mg total) by mouth daily.   rosuvastatin (CRESTOR) 40 MG tablet Take 40 mg by mouth daily.     Allergies: No Known Allergies  Social History: The  patient  reports that he quit smoking about 51 years ago. His smoking use included cigarettes. He started smoking about 60 years ago. He has a 9 pack-year smoking history. He has never used smokeless tobacco. He reports current alcohol use. He reports that he does not use drugs.   Family History: The patient's family history includes Diabetes in his mother; Heart failure in  his father; Prostate cancer in his father; Stroke in his mother.   Review of Systems: Please see the history of present illness.   All other systems are reviewed and negative.   Physical Exam: VS:  BP 130/80   Pulse (!) 48   Ht 5\' 6"  (1.676 m)   Wt 174 lb (78.9 kg)   BMI 28.08 kg/m  .  BMI Body mass index is 28.08 kg/m.  Wt Readings from Last 3 Encounters:  05/12/23 174 lb (78.9 kg)  03/10/23 171 lb 9.6 oz (77.8 kg)  11/29/22 166 lb 3.2 oz (75.4 kg)   Affect appropriate Healthy:  appears stated age HEENT: normal Neck supple with no adenopathy JVP normal no bruits no thyromegaly Lungs clear with no wheezing and good diaphragmatic motion Heart:  S1/S2 no murmur, no rub, gallop or click PMI normal Abdomen: benighn, BS positve, no tenderness, no AAA no bruit.  No HSM or HJR Small ventral hernia with prior repair Distal pulses intact with no bruits No edema Neuro non-focal Skin warm and dry Post right TKR     LABORATORY DATA:  EKG:  05/12/2023 NSR rate 48 PR 228 msec nonspecific ST changes    Lab Results  Component Value Date   WBC 6.8 11/29/2022   HGB 14.1 11/29/2022   HCT 43.3 11/29/2022   PLT 199.0 11/29/2022   GLUCOSE 94 11/29/2022   CHOL 131 11/29/2022   TRIG 66.0 11/29/2022   HDL 54.00 11/29/2022   LDLDIRECT 82.1 04/07/2011   LDLCALC 64 11/29/2022   ALT 13 11/29/2022   AST 18 11/29/2022   NA 142 11/29/2022   K 4.1 11/29/2022   CL 110 11/29/2022   CREATININE 1.67 (H) 11/29/2022   BUN 23 11/29/2022   CO2 26 11/29/2022   TSH 2.81 11/29/2022   PSA 0.38 11/06/2015   HGBA1C 6.0 11/29/2022      Other Studies Reviewed Today:  Cath 04/06/18   Conclusion    Widely patent mid circumflex drug-eluting stent.  The circumflex beyond the stent is unchanged from before with a 50 to 70% distal bifurcation stenosis in a [place] 110 configuration.  The circumflex is otherwise patent. Left main is normal. LAD is widely patent.  A branch of the first diagonal contains ostial 80% narrowing.  This tertiary branches are small. RCA is widely patent with distal PDA and LV branch diffuse disease. Normal LV function and LVEDP 10 mmHg.   RECOMMENDATIONS:   It is conceivable that angina is due to the very distal bifurcation disease in the circumflex.  It does not appear to be angiographically severe.  Stent implantation would include crossing over an equally large side branch.  Considering the stability, complexity of an interventional procedure (with risk of side branch occlusion), and the far distal location, recommend continued conservative medical management possibly adding either Ranexa or beta-blocker therapy to the regimen.        ASSESSMENT AND PLAN:   1. CAD: 01/17/18 stent to the prox to mid Cx Single vessel disease with good result. persistent symptoms F/U myovue done 03/01/18 low risk with no ischemia EF 60%   Repeat Cath 04/06/18 with widely patent stent with medical Rx recommended  Myovue 11/18/22 normal no ischemia EF 68%   2. Ortho:  Post right TKR Norris 07/23/19    3. Hypertension: improved with weight loss. D/C Norvasc Cut Micardis down to 40 mg daily   4. HLD:  continue fibrates and statin  LDL 64 on crestor    5.  GERD -  Low carb diet protonix   6. DM -  Discussed low carb diet.  Target hemoglobin A1c is 6.5 or less.  Continue current medications. A1c 6.0 11/29/22   7. Chronic dyspnea -  CPX 01/07/19 with no cardiopulmonary limitations Stopped due to ventilatory limit due to body habitus  Now better with weight loss   8. Obesity - see above.   9. Prostate Cancer:  F/u Dr  Annabell Howells has recurrence in prostate bed likely PET pending PSA up in 4 range If PET positive will need hormone Rx  10. Hernia:  small ventral hernia with prior repair of one caudally Should not need surgery Sees Dr Gerrit Friends for this   Current medicines are reviewed with the patient today.  The patient does not have concerns regarding medicines other than what has been noted above.  The following changes have been made:  See above.  Labs/ tests ordered today include:    None   Disposition:   FU in 6 months  Signed: Charlton Haws, MD  05/12/2023 8:56 AM  [facility] [address] [place], [place]  [address] Phone: [phone] Fax: [phone]

## 2023-05-05 ENCOUNTER — Ambulatory Visit (HOSPITAL_COMMUNITY)
Admission: RE | Admit: 2023-05-05 | Discharge: 2023-05-05 | Disposition: A | Discharge status: Home / Self Care | Payer: Medicare Other | Source: Ambulatory Visit | Attending: Nephrology | Admitting: Nephrology

## 2023-05-05 DIAGNOSIS — N189 Chronic kidney disease, unspecified: Secondary | ICD-10-CM | POA: Diagnosis not present

## 2023-05-05 DIAGNOSIS — N1832 Chronic kidney disease, stage 3b: Secondary | ICD-10-CM | POA: Diagnosis not present

## 2023-05-08 DIAGNOSIS — N393 Stress incontinence (female) (male): Secondary | ICD-10-CM | POA: Diagnosis not present

## 2023-05-08 DIAGNOSIS — N281 Cyst of kidney, acquired: Secondary | ICD-10-CM | POA: Diagnosis not present

## 2023-05-08 DIAGNOSIS — B356 Tinea cruris: Secondary | ICD-10-CM | POA: Diagnosis not present

## 2023-05-08 DIAGNOSIS — C61 Malignant neoplasm of prostate: Secondary | ICD-10-CM | POA: Diagnosis not present

## 2023-05-10 ENCOUNTER — Other Ambulatory Visit (HOSPITAL_COMMUNITY): Payer: Self-pay | Admitting: Urology

## 2023-05-10 DIAGNOSIS — R9721 Rising PSA following treatment for malignant neoplasm of prostate: Secondary | ICD-10-CM

## 2023-05-12 ENCOUNTER — Encounter: Payer: Self-pay | Admitting: Cardiovascular Disease

## 2023-05-12 ENCOUNTER — Ambulatory Visit: Payer: Medicare Other | Attending: Cardiovascular Disease | Admitting: Cardiovascular Disease

## 2023-05-12 VITALS — BP 130/80 | HR 48 | Ht 66.0 in | Wt 174.0 lb

## 2023-05-12 DIAGNOSIS — C61 Malignant neoplasm of prostate: Secondary | ICD-10-CM | POA: Insufficient documentation

## 2023-05-12 DIAGNOSIS — I2510 Atherosclerotic heart disease of native coronary artery without angina pectoris: Secondary | ICD-10-CM | POA: Diagnosis not present

## 2023-05-12 DIAGNOSIS — I10 Essential (primary) hypertension: Secondary | ICD-10-CM | POA: Insufficient documentation

## 2023-05-12 NOTE — Patient Instructions (Signed)
 Medication Instructions:  Your physician recommends that you continue on your current medications as directed. Please refer to the Current Medication list given to you today.  *If you need a refill on your cardiac medications before your next appointment, please call your pharmacy*   Follow-Up: At [facility], you and your health needs are our priority.  As part of our continuing mission to provide you with exceptional heart care, we have created designated Provider Care Teams.  These Care Teams include your primary Cardiologist (physician) and Advanced Practice Providers (APPs -  Physician Assistants and Nurse Practitioners) who all work together to provide you with the care you need, when you need it.  Your next appointment:   1 year  Provider:   Charlton Haws, MD        1st Floor: - Lobby - Registration  - Pharmacy  - Lab - Cafe  2nd Floor: - PV Lab - Diagnostic Testing (echo, CT, nuclear med)  3rd Floor: - Vacant  4th Floor: - TCTS (cardiothoracic surgery) - AFib Clinic - Structural Heart Clinic - Vascular Surgery  - Vascular Ultrasound  5th Floor: - HeartCare Cardiology (general and EP) - Clinical Pharmacy for coumadin, hypertension, lipid, weight-loss medications, and med management appointments    Valet parking services will be available as well.

## 2023-05-22 ENCOUNTER — Encounter (HOSPITAL_COMMUNITY)
Admission: RE | Admit: 2023-05-22 | Discharge: 2023-05-22 | Disposition: A | Discharge status: Home / Self Care | Source: Ambulatory Visit | Attending: Urology | Admitting: Urology

## 2023-05-22 DIAGNOSIS — C61 Malignant neoplasm of prostate: Secondary | ICD-10-CM | POA: Diagnosis not present

## 2023-05-22 DIAGNOSIS — R9721 Rising PSA following treatment for malignant neoplasm of prostate: Secondary | ICD-10-CM | POA: Insufficient documentation

## 2023-05-22 MED ORDER — FLOTUFOLASTAT F 18 GALLIUM 296-5846 MBQ/ML IV SOLN
8.1160 | Freq: Once | INTRAVENOUS | Status: AC
  Administered 2023-05-22: 8.116 via INTRAVENOUS

## 2023-05-29 ENCOUNTER — Encounter: Payer: Self-pay | Admitting: Family Medicine

## 2023-05-29 ENCOUNTER — Ambulatory Visit (INDEPENDENT_AMBULATORY_CARE_PROVIDER_SITE_OTHER): Payer: Medicare Other | Admitting: Family Medicine

## 2023-05-29 VITALS — BP 120/68 | HR 48 | Temp 97.9°F | Wt 174.8 lb

## 2023-05-29 DIAGNOSIS — E1122 Type 2 diabetes mellitus with diabetic chronic kidney disease: Secondary | ICD-10-CM | POA: Diagnosis not present

## 2023-05-29 DIAGNOSIS — C61 Malignant neoplasm of prostate: Secondary | ICD-10-CM | POA: Diagnosis not present

## 2023-05-29 DIAGNOSIS — I10 Essential (primary) hypertension: Secondary | ICD-10-CM | POA: Diagnosis not present

## 2023-05-29 DIAGNOSIS — E785 Hyperlipidemia, unspecified: Secondary | ICD-10-CM | POA: Diagnosis not present

## 2023-05-29 DIAGNOSIS — N1831 Chronic kidney disease, stage 3a: Secondary | ICD-10-CM | POA: Diagnosis not present

## 2023-05-29 DIAGNOSIS — I2510 Atherosclerotic heart disease of native coronary artery without angina pectoris: Secondary | ICD-10-CM | POA: Diagnosis not present

## 2023-05-29 LAB — LIPID PANEL
Cholesterol: 136 mg/dL (ref 0–200)
HDL: 44.8 mg/dL (ref >39.00)
LDL Cholesterol: 73 mg/dL (ref 0–99)
NonHDL: 90.75
Total CHOL/HDL Ratio: 3
Triglycerides: 89 mg/dL (ref 0.0–149.0)
VLDL: 17.8 mg/dL (ref 0.0–40.0)

## 2023-05-29 LAB — HEMOGLOBIN A1C: Hgb A1c MFr Bld: 6 % (ref 4.6–6.5)

## 2023-05-29 NOTE — Progress Notes (Signed)
   Subjective:    Patient ID: [name] [name]., male    DOB: 14-Oct-1945, 78 y.o.   MRN: [id number]  HPI Here to follow up. He feels fine. His BP is stable. He recently saw his cardiologist and his nephrologist, and his heart and kidney functions are stable. His PSA doubled to 5.0 recently, so Dr. Wilson Singer ordered a PET scan. We are still waiting on these results.    Review of Systems  Constitutional: Negative.   Respiratory: Negative.    Cardiovascular: Negative.   Neurological: Negative.        Objective:   Physical Exam Constitutional:      Appearance: Normal appearance.  Cardiovascular:     Rate and Rhythm: Normal rate and regular rhythm.     Pulses: Normal pulses.     Heart sounds: Normal heart sounds.  Pulmonary:     Effort: Pulmonary effort is normal.     Breath sounds: Normal breath sounds.  Neurological:     General: No focal deficit present.     Mental Status: He is alert and oriented to person, place, and time.           Assessment & Plan:  He is doing well from a cardiac and renal standpoint. We are waiting on the PET scan as above. We will check fasting labs today for lipids and an A1c. Gershon Crane, MD

## 2023-05-30 ENCOUNTER — Encounter: Payer: Self-pay | Admitting: *Deleted

## 2023-06-08 ENCOUNTER — Telehealth: Payer: Self-pay | Admitting: Radiation Oncology

## 2023-06-08 NOTE — Telephone Encounter (Signed)
 Left message for patient to call back to schedule consult per 4/1 referral.

## 2023-06-13 NOTE — Progress Notes (Signed)
 GU Location of Tumor / Histology: Prostate Ca  PSA is (4.96 on 05/02/2023)  Retropubic Prostatectomy (2009)  [name] [name]. presented as referral from Dr. [name] [facility]) elevated PSA.  Biopsies (05/10/2007)    05/22/2023 Dr. [name] NM PET (PSMA) Skull to Mid Thigh CLINICAL DATA:  Prostate carcinoma with biochemical recurrence.  IMPRESSION: 1. Nodular soft tissue in the prostatectomy bed with intense radiotracer activity consistent with local prostate carcinoma recurrence. 2. No evidence of metastatic adenopathy in the pelvis or periaortic retroperitoneum. 3. No evidence of visceral metastasis or skeletal metastasis. 4.  Aortic Atherosclerosis (ICD10-I70.0).   06/29/2020 Dr. [name] NM PET ([id number]) Skull to Mid Thigh CLINICAL DATA:  Prostatectomy 13 years ago. Radiation therapy 11 years ago. Elevated PSA of 2.1.   IMPRESSION: 1. Status post prostatectomy. No tracer avid soft tissue or osseous metastasis. 2. Incidental findings, including: Coronary artery atherosclerosis. Aortic Atherosclerosis (ICD10-I70.0). Hepatic steatosis.    Past/Anticipated interventions by urology, if any: NA  Past/Anticipated interventions by medical oncology, if any: NA  Weight changes, if any: No  IPSS:  8 SHIM:  5  Bowel/Bladder complaints, if any:  Urinary incontinence   Nausea/Vomiting, if any:  No  Pain issues, if any:  0/10  SAFETY ISSUES: Prior radiation? Yes, prostate XRT in 2011. Pacemaker/ICD? No Possible current pregnancy? Male Is the patient on methotrexate? No  Current Complaints / other details:

## 2023-06-15 NOTE — Progress Notes (Signed)
 Radiation Oncology         [phone]) [phone] ________________________________  Initial Outpatient Consultation  Name: [name] [name]. MRN: [id number]  Date: 06/19/2023  DOB: 11/03/1945  CC:Fry, Lenis Quin, MD  [name] Luster, MD   REFERRING PHYSICIAN: [name], MD  DIAGNOSIS: 78 y.o. gentleman with locally recurrent prostate cancer with a rising PSA of 4.96 s/p RRP 07/2007 and salvage XRT in 10/2009 for Stage pTxN0, Gleason 4+3 adenocarcinoma of the prostate.     ICD-10-CM   1. Prostate carcinoma, recurrent (HCC)  C61     2. Recurrent prostate adenocarcinoma (HCC)  C61       HISTORY OF PRESENT ILLNESS: [name] [name]. is a 78 y.o. male with a diagnosis of locally recurrent prostate cancer. He was initially diagnosed with low volume Gleason 4+3 prostate cancer on biopsy 05/10/07 under the care of Dr. Inga [name]. He opted to proceed with RALP on 07/12/07 and final surgical pathology confirmed Gleason 4+3 prostatic adenocarcinoma with a positive margin in a shave section from the bladder base with perineural invasion. All other margins and 2 of 2 sampled lymph nodes were negative. His postoperative PSA was initially undetectable.  His PSA began to rise postoperatively and reached 0.17 in 06/2009. He was referred to Dr. Eloise [name] and completed a course of salvage radiation therapy to the prostatectomy bed in 10/2009. His PSA became undetectable following treatment but became very low detectable in 2013 where it remained through 2014 and then began gradually rising from 2015 through 09/2022. An axumin PET scan in 2020 showed mild focal activity in the prostatectomy bed, suspicious for local recurrence but PSMA PET in 06/2020 was negative.  His most recent PSA from 05/01/23 had increased to 4.96 from 2.96 in 09/2022. Therefore, he underwent repeat restaging PSMA PET scan on 05/22/23 which now shows nodular soft tissue in the prostatectomy bed with intense radiotracer activity but no evidence of metastatic adenopathy,  visceral metastasis, or skeletal metastasis.  The patient reviewed the PSA and imaging results with his urologist and he has kindly been referred today for discussion of potential additional salvage radiation treatment options.   PREVIOUS RADIATION THERAPY: Yes  10/2009: Salvage radiation to the Prostatic Fossa (Dr. Eloise [name])  PAST MEDICAL HISTORY:  Past Medical History:  Diagnosis Date   ABNORMAL EKG 10/07/2008   ADVEF, DRUG/MED/BIOL SUBST, OTHER DRUG NOS 09/26/2006   Agent orange exposure    Allergy    seasonal   Cataract    left eye-surgery 07-2015   DEGENERATIVE JOINT DISEASE, MODERATE 04/13/2007   DIVERTICULOSIS, COLON 04/13/2007   ELEVATED PROSTATE SPECIFIC ANTIGEN 04/14/2007   GERD (gastroesophageal reflux disease)    Glaucoma    sees Dr. Candi [name]    History of kidney stones    1970    HYPERLIPIDEMIA 09/26/2006   HYPERTENSION 04/13/2007   JOINT EFFUSION, KNEE 06/15/2009   Melanoma (HCC)    sees Dr. Denman [name]    Numbness and tingling    Pre-diabetes    PROSTATE CANCER 10/05/2009   sees Dr. Inga [name]   Renal insufficiency    stage III [facility]       PAST SURGICAL HISTORY: Past Surgical History:  Procedure Laterality Date   ACROMIOPLASTY Right 05-06-14   per Dr. Alyson [name]     bilateral   CATARACT EXTRACTION Left    per Dr. Candi [name]    COLONOSCOPY  07/10/2015   per Dr. Howard [name], benign polyp, repeat in 10 yrs    CORONARY STENT  INTERVENTION N/A 01/17/2018   Procedure: CORONARY STENT INTERVENTION;  Surgeon: [name] Lacer, MD;  Location: [facility] INVASIVE CV LAB;  Service: Cardiovascular;  Laterality: N/A;   CYSTECTOMY     benign from hand   GLAUCOMA SURGERY Bilateral 2014    heart stent     INSERTION OF MESH N/A 01/10/2013   Procedure: INSERTION OF MESH;  Surgeon: [name] Pata, MD;  Location: [facility] OR;  Service: General;  Laterality: N/A;   KNEE ARTHROSCOPY Right Jan. 2013   right knee, per Dr. Brunilda [name]    KNEE ARTHROSCOPY Left 01-14-15   per Dr. Brunilda [name]    LEFT HEART CATH AND  CORONARY ANGIOGRAPHY N/A 01/17/2018   Procedure: LEFT HEART CATH AND CORONARY ANGIOGRAPHY;  Surgeon: [name] Lacer, MD;  Location: [facility] INVASIVE CV LAB;  Service: Cardiovascular;  Laterality: N/A;   LEFT HEART CATH AND CORONARY ANGIOGRAPHY N/A 04/06/2018   Procedure: LEFT HEART CATH AND CORONARY ANGIOGRAPHY;  Surgeon: [name] Binning, MD;  Location: [facility] INVASIVE CV LAB;  Service: Cardiovascular;  Laterality: N/A;   MELANOMA EXCISION  2012   per Dr. Denman [name]   PROSTATECTOMY     per Dr. Inga [name]   TONSILLECTOMY     TOTAL KNEE ARTHROPLASTY Right 07/22/2019   Procedure: TOTAL KNEE ARTHROPLASTY;  Surgeon: [name] Hawking, MD;  Location: [facility] ORS;  Service: Orthopedics;  Laterality: Right;   TOTAL KNEE ARTHROPLASTY Left 07/10/2020   Procedure: TOTAL KNEE ARTHROPLASTY;  Surgeon: [name] Hawking, MD;  Location: [facility] ORS;  Service: Orthopedics;  Laterality: Left;   UMBILICAL HERNIA REPAIR  11/01/2011   Procedure: HERNIA REPAIR UMBILICAL ADULT;  Surgeon: [name] Pata, MD;  Location: [facility];  Service: General;  Laterality: N/A;  Repair umbilical hernia with mesh patch   VASECTOMY     VENTRAL HERNIA REPAIR  01/10/2013   with mesh     Dr [name] [name] HERNIA REPAIR N/A 01/10/2013   Procedure: HERNIA REPAIR VENTRAL ADULT ;  Surgeon: [name] Pata, MD;  Location: [facility] OR;  Service: General;  Laterality: N/A;    FAMILY HISTORY:  Family History  Problem Relation Age of Onset   Stroke Mother    Diabetes Mother    Heart failure Father    Prostate cancer Father    Colon cancer Neg Hx    Colon polyps Neg Hx    Esophageal cancer Neg Hx    Rectal cancer Neg Hx    Stomach cancer Neg Hx     SOCIAL HISTORY:  Social History   Socioeconomic History   Marital status: Married    Spouse name: [name] [name]   Number of children: 2   Years of education: 12   Highest education level: 12th grade  Occupational History   Occupation: Physiological scientist    Comment: retired  Tobacco Use   Smoking status: Former     Current packs/day: 0.00    Average packs/day: 1 pack/day for 9.0 years (9.0 ttl pk-yrs)    Types: Cigarettes    Start date: 03/08/1963    Quit date: 03/07/1972    Years since quitting: 51.3   Smokeless tobacco: Never   Tobacco comments:    States AAA completed   Vaping Use   Vaping status: Never Used  Substance and Sexual Activity   Alcohol use: Yes    Comment: occasional   Drug use: Never   Sexual activity: Not on file  Other Topics Concern   Not on file  Social History Narrative  Was in the [employer]; Paediatric nurse   Currently can go to the [employer] exposure   Right-handed.   2-3 cups caffeine daily,      Wife; married 40 years   2 children; grands no       05/11/2018:    Lives with wife on one level home   Enjoys going swimming at gym 3 days/week; currently enjoying cardiac rehab   Has one son, one daughter, and one granddaughter who all live in [place]. Travels to see them every other week.      Social Drivers of Corporate investment banker Strain: Low Risk  (03/06/2023)   Overall Financial Resource Strain (CARDIA)    Difficulty of Paying Living Expenses: Not hard at all  Food Insecurity: No Food Insecurity (06/19/2023)   Hunger Vital Sign    Worried About Running Out of Food in the Last Year: Never true    Ran Out of Food in the Last Year: Never true  Transportation Needs: No Transportation Needs (06/19/2023)   PRAPARE - Administrator, Civil Service (Medical): No    Lack of Transportation (Non-Medical): No  Physical Activity: Sufficiently Active (03/06/2023)   Exercise Vital Sign    Days of Exercise per Week: 4 days    Minutes of Exercise per Session: 40 min  Stress: No Stress Concern Present (03/06/2023)   [employer] of Occupational Health - Occupational Stress Questionnaire    Feeling of Stress : Not at all  Social Connections: Moderately Isolated (03/06/2023)   Social Connection and Isolation Panel [NHANES]    Frequency of  Communication with Friends and Family: Once a week    Frequency of Social Gatherings with Friends and Family: Once a week    Attends Religious Services: 1 to 4 times per year    Active Member of [employer] or Organizations: No    Attends Banker Meetings: Never    Marital Status: Married  Catering manager Violence: Not At Risk (06/19/2023)   Humiliation, Afraid, Rape, and Kick questionnaire    Fear of Current or Ex-Partner: No    Emotionally Abused: No    Physically Abused: No    Sexually Abused: No    ALLERGIES: Patient has no known allergies.  MEDICATIONS:  Current Outpatient Medications  Medication Sig Dispense Refill   aspirin EC 81 MG tablet Take 81 mg by mouth daily. Swallow whole.     bimatoprost (LUMIGAN) 0.01 % SOLN Place 1 drop into both eyes at bedtime.     clopidogrel (PLAVIX) 75 MG tablet Take 1 tablet (75 mg total) by mouth daily with breakfast. (Patient taking differently: Take 75 mg by mouth daily.) 90 tablet 8   dorzolamide-timolol (COSOPT) 22.3-6.8 MG/ML ophthalmic solution Place 1 drop into both eyes 2 (two) times daily.      Ergocalciferol (VITAMIN D2) 50 MCG (2000 UT) TABS Take 2,000 Units by mouth daily.     famotidine (PEPCID) 20 MG tablet Take 20 mg by mouth at bedtime.      fenofibrate 160 MG tablet Take 160 mg by mouth at bedtime.      fluticasone (FLONASE) 50 MCG/ACT nasal spray Place 1 spray into both nostrils daily as needed for allergies.     isosorbide mononitrate (IMDUR) 30 MG 24 hr tablet Take 30 mg by mouth daily.     magnesium oxide (MAG-OX) 400 MG tablet Take 400 mg by mouth every evening.      nitroGLYCERIN (NITROSTAT)  0.4 MG SL tablet Place 0.4 mg under the tongue every 5 (five) minutes as needed for chest pain.     pantoprazole (PROTONIX) 40 MG tablet Take 1 tablet (40 mg total) by mouth daily. 90 tablet 3   rosuvastatin (CRESTOR) 40 MG tablet Take 40 mg by mouth daily.     telmisartan (MICARDIS) 80 MG tablet Take 80 mg by mouth daily.      No current facility-administered medications for this encounter.    REVIEW OF SYSTEMS:  On review of systems, the patient reports that he is doing well overall. He denies any chest pain, shortness of breath, cough, fevers, chills, night sweats, unintended weight changes. He denies any bowel disturbances, and denies abdominal pain, nausea or vomiting. He denies any new musculoskeletal or joint aches or pains. His IPSS was 8, indicating mild urinary symptoms although he continues with significant post-op urinary incontinence requiring 1-2 pads/day. His SHIM was 5, indicating he has postoperative erectile dysfunction. A complete review of systems is obtained and is otherwise negative.    PHYSICAL EXAM:  Wt Readings from Last 3 Encounters:  06/19/23 175 lb 3.2 oz (79.5 kg)  05/29/23 174 lb 12.8 oz (79.3 kg)  05/12/23 174 lb (78.9 kg)   Temp Readings from Last 3 Encounters:  06/19/23 (!) 97.3 F (36.3 C)  05/29/23 97.9 F (36.6 C) (Oral)  03/10/23 98 F (36.7 C) (Oral)   BP Readings from Last 3 Encounters:  06/19/23 (!) 157/79  05/29/23 120/68  05/12/23 130/80   Pulse Readings from Last 3 Encounters:  06/19/23 (!) 50  05/29/23 (!) 48  05/12/23 (!) 48   Pain Assessment Pain Score: 0-No pain/10  In general this is a well appearing Caucasian man in no acute distress. He's alert and oriented x4 and appropriate throughout the examination. Cardiopulmonary assessment is negative for acute distress, and he exhibits normal effort.     KPS = 100  100 - Normal; no complaints; no evidence of disease. 90   - Able to carry on normal activity; minor signs or symptoms of disease. 80   - Normal activity with effort; some signs or symptoms of disease. 86   - Cares for self; unable to carry on normal activity or to do active work. 60   - Requires occasional assistance, but is able to care for most of his personal needs. 50   - Requires considerable assistance and frequent medical care. 40   -  Disabled; requires special care and assistance. 30   - Severely disabled; hospital admission is indicated although death not imminent. 20   - Very sick; hospital admission necessary; active supportive treatment necessary. 10   - Moribund; fatal processes progressing rapidly. 0     - Dead  Karnofsky DA, Abelmann WH, Craver LS and Burchenal [facility] [phone]) The use of the nitrogen mustards in the palliative treatment of carcinoma: with particular reference to bronchogenic carcinoma Cancer 1 [phone]  LABORATORY DATA:  Lab Results  Component Value Date   WBC 6.8 11/29/2022   HGB 14.1 11/29/2022   HCT 43.3 11/29/2022   MCV 90.5 11/29/2022   PLT 199.0 11/29/2022   Lab Results  Component Value Date   NA 142 11/29/2022   K 4.1 11/29/2022   CL 110 11/29/2022   CO2 26 11/29/2022   Lab Results  Component Value Date   ALT 13 11/29/2022   AST 18 11/29/2022   ALKPHOS 40 11/29/2022   BILITOT 0.6 11/29/2022     RADIOGRAPHY: NM  PET (PSMA) SKULL TO MID THIGH Result Date: 06/01/2023 CLINICAL DATA:  Prostate carcinoma with biochemical recurrence. EXAM: NUCLEAR MEDICINE PET SKULL BASE TO THIGH TECHNIQUE: 8.2 mCi Flotufolastat (Posluma) was injected intravenously. Full-ring PET imaging was performed from the skull base to thigh after the radiotracer. CT data was obtained and used for attenuation correction and anatomic localization. COMPARISON:  PSMA PET scan 06/29/2020 FINDINGS: NECK No radiotracer activity in neck lymph nodes. Incidental CT finding: None. CHEST No radiotracer accumulation within mediastinal or hilar lymph nodes. No suspicious pulmonary nodules on the CT scan. Incidental CT finding: None. ABDOMEN/PELVIS Prostate: There is nodularity in the prostatectomy bed with intense radiotracer activity. Nodular focus between the base of the bladder and rectum on image 189 measures 12 x 20 mm and has intense radiotracer activity SUV max 14 8. Second nodular soft tissue immediately superior on the RIGHT  measuring 12 mm image 187 also with intense radiotracer activity. Lymph nodes: No abnormal radiotracer accumulation within pelvic or abdominal nodes. Liver: No evidence of liver metastasis. Incidental CT finding: Benign RIGHT renal cysts. Atherosclerotic calcification of the aorta. SKELETON No focal activity to suggest skeletal metastasis. IMPRESSION: 1. Nodular soft tissue in the prostatectomy bed with intense radiotracer activity consistent with local prostate carcinoma recurrence. 2. No evidence of metastatic adenopathy in the pelvis or periaortic retroperitoneum. 3. No evidence of visceral metastasis or skeletal metastasis. 4.  Aortic Atherosclerosis (ICD10-I70.0). Electronically Signed   By: Deboraha Fallow M.D.   On: 06/01/2023 15:18      IMPRESSION/PLAN: 1. 78 y.o. gentleman with locally recurrent prostate cancer with a rising PSA of 4.96 s/p RALP 07/2007 and salvage XRT in 10/2009 for Stage pTXN0, Gleason 4+3 adenocarcinoma of the prostate.  Today, we talked to the patient and his wife, [name] [name], about the findings and workup thus far. We discussed the natural history of locally recurrent prostate cancer and general treatment, highlighting the role of focused salvage radiotherapy in the management. We discussed the available radiation techniques, and focused on the details and logistics of delivery. The recommendation is for a 5 fraction course of stereotactic body radiotherapy (SBRT) to the recurrent disease in the prostate fossa/SV remnant. We reviewed the anticipated acute and late sequelae associated with radiation in this setting. The patient and his wife were encouraged to ask questions that were answered to their stated satisfaction.  At the conclusion of our conversation, the patient is in agreement to proceed with the recommended 5 fraction course of stereotactic body radiotherapy (SBRT) to the recurrent disease in the prostate fossa/SV remnant. He has freely signed written consent to proceed  today in the office and a copy of this document will be placed in his medical record. He is tentatively scheduled for CT SIM/treatment planning at 1pm on Thursday 06/22/23 so we will share our discussion with Dr. Wrenn and proceed with treatment planning, in anticipation of beginning his treatments in the near future. We enjoyed meeting him and his wife, [name] [name], today and look forward to continuing to participate in his care.   We personally spent 70 minutes in this encounter including chart review, reviewing radiological studies, meeting face-to-face with the patient, entering orders and completing documentation.    Arta Bihari, PA-C    [name], MD  [facility]  Radiation Oncology Direct Dial: [phone]  Fax: [phone] [email or web address]  Skype  [email or web address]   This document serves as a record of services personally performed by [name], MD and [name] Pata, PA-C. It was created on their  behalf by Florance Hun, a trained medical scribe. The creation of this record is based on the scribe's personal observations and the provider's statements to them. This document has been checked and approved by the attending provider.

## 2023-06-19 ENCOUNTER — Ambulatory Visit
Admission: RE | Admit: 2023-06-19 | Discharge: 2023-06-19 | Disposition: A | Discharge status: Home / Self Care | Source: Ambulatory Visit | Attending: Radiation Oncology | Admitting: Radiation Oncology

## 2023-06-19 ENCOUNTER — Other Ambulatory Visit: Payer: Self-pay

## 2023-06-19 ENCOUNTER — Encounter: Payer: Self-pay | Admitting: Radiation Oncology

## 2023-06-19 VITALS — BP 157/79 | HR 50 | Temp 97.3°F | Resp 20 | Ht 66.0 in | Wt 175.2 lb

## 2023-06-19 DIAGNOSIS — E785 Hyperlipidemia, unspecified: Secondary | ICD-10-CM | POA: Diagnosis not present

## 2023-06-19 DIAGNOSIS — Z79899 Other long term (current) drug therapy: Secondary | ICD-10-CM | POA: Insufficient documentation

## 2023-06-19 DIAGNOSIS — Z7982 Long term (current) use of aspirin: Secondary | ICD-10-CM | POA: Insufficient documentation

## 2023-06-19 DIAGNOSIS — C61 Malignant neoplasm of prostate: Secondary | ICD-10-CM | POA: Diagnosis not present

## 2023-06-19 DIAGNOSIS — I700 Atherosclerosis of aorta: Secondary | ICD-10-CM | POA: Diagnosis not present

## 2023-06-19 DIAGNOSIS — N281 Cyst of kidney, acquired: Secondary | ICD-10-CM | POA: Insufficient documentation

## 2023-06-19 DIAGNOSIS — Z7902 Long term (current) use of antithrombotics/antiplatelets: Secondary | ICD-10-CM | POA: Insufficient documentation

## 2023-06-19 DIAGNOSIS — Z923 Personal history of irradiation: Secondary | ICD-10-CM | POA: Insufficient documentation

## 2023-06-19 DIAGNOSIS — Z87442 Personal history of urinary calculi: Secondary | ICD-10-CM | POA: Diagnosis not present

## 2023-06-19 DIAGNOSIS — I10 Essential (primary) hypertension: Secondary | ICD-10-CM | POA: Insufficient documentation

## 2023-06-19 DIAGNOSIS — Z191 Hormone sensitive malignancy status: Secondary | ICD-10-CM | POA: Diagnosis not present

## 2023-06-19 DIAGNOSIS — Z87891 Personal history of nicotine dependence: Secondary | ICD-10-CM | POA: Diagnosis not present

## 2023-06-19 DIAGNOSIS — K219 Gastro-esophageal reflux disease without esophagitis: Secondary | ICD-10-CM | POA: Diagnosis not present

## 2023-06-19 NOTE — Progress Notes (Signed)
 Introduced myself to the patient, and his wife, as the prostate nurse navigator.  No barriers to care identified at this time.  He is here to discuss his radiation treatment options.  I gave him my business card and asked him to call me with questions or concerns.  Verbalized understanding.

## 2023-06-22 ENCOUNTER — Ambulatory Visit
Admission: RE | Admit: 2023-06-22 | Discharge: 2023-06-22 | Disposition: A | Discharge status: Home / Self Care | Source: Ambulatory Visit | Attending: Radiation Oncology | Admitting: Radiation Oncology

## 2023-06-22 DIAGNOSIS — Z191 Hormone sensitive malignancy status: Secondary | ICD-10-CM | POA: Diagnosis not present

## 2023-06-22 DIAGNOSIS — C61 Malignant neoplasm of prostate: Secondary | ICD-10-CM | POA: Insufficient documentation

## 2023-06-22 DIAGNOSIS — Z510 Encounter for antineoplastic radiation therapy: Secondary | ICD-10-CM | POA: Diagnosis not present

## 2023-06-22 NOTE — Progress Notes (Signed)
  Radiation Oncology         [id number]) [phone] ________________________________  Name: [name] [name]. MRN: [id number]  Date: 06/22/2023  DOB: Jul 25, 1945  STEREOTACTIC BODY RADIOTHERAPY SIMULATION AND TREATMENT PLANNING NOTE    ICD-10-CM   1. Recurrent prostate adenocarcinoma (HCC)  C61       DIAGNOSIS:  78 y.o. gentleman with locally recurrent prostate cancer with a rising PSA of 4.96 s/p RRP 07/2007 and salvage XRT in 10/2009 for Stage pTxN0, Gleason 4+3 adenocarcinoma of the prostate.   NARRATIVE:  The patient was brought to the CT Simulation planning suite.  Identity was confirmed.  All relevant records and images related to the planned course of therapy were reviewed.  The patient freely provided informed written consent to proceed with treatment after reviewing the details related to the planned course of therapy. The consent form was witnessed and verified by the simulation staff.  Then, the patient was set-up in a stable reproducible  supine position for radiation therapy.  A BodyFix immobilization pillow was fabricated for reproducible positioning.  Surface markings were placed.  The CT images were loaded into the planning software.  The gross target volumes (GTV) and planning target volumes (PTV) were delinieated, and avoidance structures were contoured.  Treatment planning then occurred.  The radiation prescription was entered and confirmed.  A total of two complex treatment devices were fabricated in the form of the BodyFix immobilization pillow and a neck accuform cushion.  I have requested : 3D Simulation  I have requested a DVH of the following structures: targets and all normal structures near the target including rectum, bladder and others as noted on the radiation plan to maintain doses in adherence with established limits  SPECIAL TREATMENT PROCEDURE:  The planned course of therapy using radiation constitutes a special treatment procedure. Special care is required in the management of  this patient for the following reasons. High dose per fraction requiring special monitoring for increased toxicities of treatment including daily imaging..  The special nature of the planned course of radiotherapy will require increased physician supervision and oversight to ensure patient's safety with optimal treatment outcomes.    This requires extended time and effort.    PLAN:  The recurrent disease in the prostate fossa will be treated to 36.25 Gy in 5 fractions.  ________________________________  Trilby Fujisawa Lorri Rota, M.D.

## 2023-06-30 DIAGNOSIS — Z191 Hormone sensitive malignancy status: Secondary | ICD-10-CM | POA: Diagnosis not present

## 2023-06-30 DIAGNOSIS — Z510 Encounter for antineoplastic radiation therapy: Secondary | ICD-10-CM | POA: Diagnosis not present

## 2023-06-30 DIAGNOSIS — C61 Malignant neoplasm of prostate: Secondary | ICD-10-CM | POA: Diagnosis not present

## 2023-07-03 ENCOUNTER — Other Ambulatory Visit: Payer: Self-pay

## 2023-07-03 ENCOUNTER — Ambulatory Visit
Admission: RE | Admit: 2023-07-03 | Discharge: 2023-07-03 | Disposition: A | Discharge status: Home / Self Care | Source: Ambulatory Visit | Attending: Radiation Oncology

## 2023-07-03 DIAGNOSIS — C61 Malignant neoplasm of prostate: Secondary | ICD-10-CM | POA: Diagnosis not present

## 2023-07-03 DIAGNOSIS — Z510 Encounter for antineoplastic radiation therapy: Secondary | ICD-10-CM | POA: Diagnosis not present

## 2023-07-03 LAB — RAD ONC ARIA SESSION SUMMARY
Course Elapsed Days: 0
Plan Fractions Treated to Date: 1
Plan Prescribed Dose Per Fraction: 7.25 Gy
Plan Total Fractions Prescribed: 5
Plan Total Prescribed Dose: 36.25 Gy
Reference Point Dosage Given to Date: 7.25 Gy
Reference Point Session Dosage Given: 7.25 Gy
Session Number: 1

## 2023-07-04 ENCOUNTER — Ambulatory Visit

## 2023-07-05 ENCOUNTER — Other Ambulatory Visit: Payer: Self-pay

## 2023-07-05 ENCOUNTER — Ambulatory Visit
Admission: RE | Admit: 2023-07-05 | Discharge: 2023-07-05 | Disposition: A | Discharge status: Home / Self Care | Source: Ambulatory Visit | Attending: Radiation Oncology

## 2023-07-05 DIAGNOSIS — Z510 Encounter for antineoplastic radiation therapy: Secondary | ICD-10-CM | POA: Diagnosis not present

## 2023-07-05 DIAGNOSIS — C61 Malignant neoplasm of prostate: Secondary | ICD-10-CM | POA: Diagnosis not present

## 2023-07-05 LAB — RAD ONC ARIA SESSION SUMMARY
Course Elapsed Days: 2
Plan Fractions Treated to Date: 2
Plan Prescribed Dose Per Fraction: 7.25 Gy
Plan Total Fractions Prescribed: 5
Plan Total Prescribed Dose: 36.25 Gy
Reference Point Dosage Given to Date: 14.5 Gy
Reference Point Session Dosage Given: 7.25 Gy
Session Number: 2

## 2023-07-06 ENCOUNTER — Ambulatory Visit

## 2023-07-07 ENCOUNTER — Other Ambulatory Visit: Payer: Self-pay

## 2023-07-07 ENCOUNTER — Ambulatory Visit
Admission: RE | Admit: 2023-07-07 | Discharge: 2023-07-07 | Disposition: A | Discharge status: Home / Self Care | Source: Ambulatory Visit | Attending: Radiation Oncology | Admitting: Radiation Oncology

## 2023-07-07 DIAGNOSIS — C61 Malignant neoplasm of prostate: Secondary | ICD-10-CM | POA: Insufficient documentation

## 2023-07-07 DIAGNOSIS — Z510 Encounter for antineoplastic radiation therapy: Secondary | ICD-10-CM | POA: Insufficient documentation

## 2023-07-07 LAB — RAD ONC ARIA SESSION SUMMARY
Course Elapsed Days: 4
Plan Fractions Treated to Date: 3
Plan Prescribed Dose Per Fraction: 7.25 Gy
Plan Total Fractions Prescribed: 5
Plan Total Prescribed Dose: 36.25 Gy
Reference Point Dosage Given to Date: 21.75 Gy
Reference Point Session Dosage Given: 7.25 Gy
Session Number: 3

## 2023-07-11 ENCOUNTER — Other Ambulatory Visit: Payer: Self-pay

## 2023-07-11 ENCOUNTER — Ambulatory Visit
Admission: RE | Admit: 2023-07-11 | Discharge: 2023-07-11 | Disposition: A | Discharge status: Home / Self Care | Source: Ambulatory Visit | Attending: Radiation Oncology

## 2023-07-11 DIAGNOSIS — Z510 Encounter for antineoplastic radiation therapy: Secondary | ICD-10-CM | POA: Diagnosis not present

## 2023-07-11 DIAGNOSIS — C61 Malignant neoplasm of prostate: Secondary | ICD-10-CM | POA: Diagnosis not present

## 2023-07-11 LAB — RAD ONC ARIA SESSION SUMMARY
Course Elapsed Days: 8
Plan Fractions Treated to Date: 4
Plan Prescribed Dose Per Fraction: 7.25 Gy
Plan Total Fractions Prescribed: 5
Plan Total Prescribed Dose: 36.25 Gy
Reference Point Dosage Given to Date: 29 Gy
Reference Point Session Dosage Given: 7.25 Gy
Session Number: 4

## 2023-07-13 ENCOUNTER — Ambulatory Visit
Admission: RE | Admit: 2023-07-13 | Discharge: 2023-07-13 | Disposition: A | Discharge status: Home / Self Care | Source: Ambulatory Visit | Attending: Radiation Oncology | Admitting: Radiation Oncology

## 2023-07-13 ENCOUNTER — Other Ambulatory Visit: Payer: Self-pay

## 2023-07-13 DIAGNOSIS — C61 Malignant neoplasm of prostate: Secondary | ICD-10-CM | POA: Diagnosis not present

## 2023-07-13 DIAGNOSIS — Z510 Encounter for antineoplastic radiation therapy: Secondary | ICD-10-CM | POA: Diagnosis not present

## 2023-07-13 DIAGNOSIS — Z191 Hormone sensitive malignancy status: Secondary | ICD-10-CM | POA: Diagnosis not present

## 2023-07-13 LAB — RAD ONC ARIA SESSION SUMMARY
Course Elapsed Days: 10
Plan Fractions Treated to Date: 5
Plan Prescribed Dose Per Fraction: 7.25 Gy
Plan Total Fractions Prescribed: 5
Plan Total Prescribed Dose: 36.25 Gy
Reference Point Dosage Given to Date: 36.25 Gy
Reference Point Session Dosage Given: 7.25 Gy
Session Number: 5

## 2023-07-13 NOTE — Progress Notes (Signed)
 Patient was a RadOnc Consult on 4/14 for his  locally recurrent prostate cancer.  Patient proceed with treatment recommendations of SBRT and will have his final radiation treatment on 07/13/23.   Patient is scheduled for a post treatment nurse call on 08/15/2023 and has his first post treatment PSA on 08/25/23 at [facility].

## 2023-07-14 NOTE — Radiation Completion Notes (Addendum)
  Radiation Oncology         [phone]) [phone] ________________________________  Name: [name] [name]. MRN: [id number]  Date: 07/13/2023  DOB: Aug 06, 1945  Referring Physician: [name], M.D. Date of Service: 2023-07-14 Radiation Oncologist: Bartholome Ligas, M.D. [facility]     RADIATION ONCOLOGY END OF TREATMENT NOTE     Diagnosis:77 y.o. gentleman with locally recurrent prostate cancer with a rising PSA of 4.96 s/p RRP 07/2007 and salvage XRT in 10/2009 for Stage pTxN0, Gleason 4+3 adenocarcinoma of the prostate.   Intent: Curative     ==========DELIVERED PLANS==========  First Treatment Date: 2023-07-03 Last Treatment Date: 2023-07-13   Plan Name: Prostate_SBRT Site: Prostate Technique: SBRT/SRT-IMRT Mode: Photon Dose Per Fraction: 7.25 Gy Prescribed Dose (Delivered / Prescribed): 36.25 Gy / 36.25 Gy Prescribed Fxs (Delivered / Prescribed): 5 / 5     ==========ON TREATMENT VISIT DATES========== 2023-07-03, 2023-07-05, 2023-07-07, 2023-07-11, 2023-07-13, 2023-07-13     See weekly On Treatment Notes in Epic for details in the Media tab (listed as Progress notes on the On Treatment Visit Dates listed above).  He tolerated the treatments well with only modest fatigue.  The patient will receive a call in about one month from the radiation oncology department. He will continue follow up with his urologist, Dr. Inga Manges, as well.  ------------------------------------------------   [name], MD [facility]  Radiation Oncology Direct Dial: [phone]  Fax: [phone] [email or web address]  Skype  [email or web address]

## 2023-07-18 DIAGNOSIS — Z96652 Presence of left artificial knee joint: Secondary | ICD-10-CM | POA: Diagnosis not present

## 2023-07-26 ENCOUNTER — Ambulatory Visit (INDEPENDENT_AMBULATORY_CARE_PROVIDER_SITE_OTHER): Payer: Medicare Other

## 2023-07-26 VITALS — Ht 66.0 in | Wt 174.0 lb

## 2023-07-26 DIAGNOSIS — Z0000 Encounter for general adult medical examination without abnormal findings: Secondary | ICD-10-CM | POA: Diagnosis not present

## 2023-07-26 NOTE — Patient Instructions (Addendum)
 [name] [name] , Thank you for taking time out of your busy schedule to complete your Annual Wellness Visit with me. I enjoyed our conversation and look forward to speaking with you again next year. I, as well as your care team,  appreciate your ongoing commitment to your health goals. Please review the following plan we discussed and let me know if I can assist you in the future. Your Game plan/ To Do List    Referrals: If you haven't heard from the office you've been referred to, please reach out to them at the phone provided.   Follow up Visits: Next Medicare AWV with our clinical staff: 08/02/24 @ 10:40a   Have you seen your provider in the last 6 months (3 months if uncontrolled diabetes)?  Next Office Visit with your provider: 11/30/23 @ 9:30a  Clinician Recommendations:  Aim for 30 minutes of exercise or brisk walking, 6-8 glasses of water , and 5 servings of fruits and vegetables each day.       This is a list of the screening recommended for you and due dates:  Health Maintenance  Topic Date Due   Yearly kidney health urinalysis for diabetes  Never done   COVID-19 Vaccine (4 - 2024-25 season) 11/06/2022   Flu Shot  10/06/2023   Yearly kidney function blood test for diabetes  11/29/2023   Hemoglobin A1C  11/29/2023   Eye exam for diabetics  02/21/2024   Medicare Annual Wellness Visit  07/25/2024   Colon Cancer Screening  07/19/2025   DTaP/Tdap/Td vaccine (3 - Td or Tdap) 02/09/2028   Pneumonia Vaccine  Completed   Zoster (Shingles) Vaccine  Completed   HPV Vaccine  Aged Out   Meningitis B Vaccine  Aged Out   Complete foot exam   Discontinued    Advanced directives: (Declined) Advance directive discussed with you today. Even though you declined this today, please call our office should you change your mind, and we can give you the proper paperwork for you to fill out. Advance Care Planning is important because it:  [x]  Makes sure you receive the medical care that is consistent with  your values, goals, and preferences  [x]  It provides guidance to your family and loved ones and reduces their decisional burden about whether or not they are making the right decisions based on your wishes.  Follow the link provided in your after visit summary or read over the paperwork we have mailed to you to help you started getting your Advance Directives in place. If you need assistance in completing these, please reach out to us  so that we can help you!  See attachments for Preventive Care and Fall Prevention Tips.

## 2023-07-26 NOTE — Progress Notes (Signed)
 Subjective:   [name] Saxer. is a 78 y.o. who presents for a Medicare Wellness preventive visit.  As a reminder, Annual Wellness Visits don't include a physical exam, and some assessments may be limited, especially if this visit is performed virtually. We may recommend an in-person follow-up visit with your provider if needed.  Visit Complete: Virtual I connected with  [name] [name]. on 07/26/23 by a audio enabled telemedicine application and verified that I am speaking with the correct person using two identifiers.  Patient Location: Home  Provider Location: Home Office  I discussed the limitations of evaluation and management by telemedicine. The patient expressed understanding and agreed to proceed.  Vital Signs: Because this visit was a virtual/telehealth visit, some criteria may be missing or patient reported. Any vitals not documented were not able to be obtained and vitals that have been documented are patient reported.    Persons Participating in Visit: Patient.  AWV Questionnaire: Yes: Patient Medicare AWV questionnaire was completed by the patient on 07/19/23; I have confirmed that all information answered by patient is correct and no changes since this date.  Cardiac Risk Factors include: male gender;advanced age (>30men, >62 women);diabetes mellitus;hypertension     Objective:     Today's Vitals   07/26/23 0927  Weight: 174 lb (78.9 kg)  Height: 5\' 6"  (1.676 m)   Body mass index is 28.08 kg/m.     07/26/2023    9:36 AM 06/19/2023   10:08 AM 07/18/2022    9:52 AM 01/10/2022    5:00 PM 07/15/2021   10:37 AM 07/13/2020   11:40 AM 07/10/2020    6:40 PM  Advanced Directives  Does Patient Have a Medical Advance Directive? No No No No No No   Would patient like information on creating a medical advance directive? No - Patient declined  No - Patient declined No - Patient declined No - Patient declined  No - Patient declined    Current Medications  (verified) Outpatient Encounter Medications as of 07/26/2023  Medication Sig   aspirin  EC 81 MG tablet Take 81 mg by mouth daily. Swallow whole.   bimatoprost (LUMIGAN) 0.01 % SOLN Place 1 drop into both eyes at bedtime.   clopidogrel  (PLAVIX ) 75 MG tablet Take 1 tablet (75 mg total) by mouth daily with breakfast. (Patient taking differently: Take 75 mg by mouth daily.)   dorzolamide -timolol  (COSOPT ) 22.3-6.8 MG/ML ophthalmic solution Place 1 drop into both eyes 2 (two) times daily.    Ergocalciferol  (VITAMIN D2) 50 MCG (2000 UT) TABS Take 2,000 Units by mouth daily.   famotidine  (PEPCID ) 20 MG tablet Take 20 mg by mouth at bedtime.    fenofibrate  160 MG tablet Take 160 mg by mouth at bedtime.    fluticasone  (FLONASE ) 50 MCG/ACT nasal spray Place 1 spray into both nostrils daily as needed for allergies.   isosorbide  mononitrate (IMDUR ) 30 MG 24 hr tablet Take 30 mg by mouth daily.   magnesium  oxide (MAG-OX) 400 MG tablet Take 400 mg by mouth every evening.    nitroGLYCERIN  (NITROSTAT ) 0.4 MG SL tablet Place 0.4 mg under the tongue every 5 (five) minutes as needed for chest pain.   pantoprazole  (PROTONIX ) 40 MG tablet Take 1 tablet (40 mg total) by mouth daily.   rosuvastatin  (CRESTOR ) 40 MG tablet Take 40 mg by mouth daily.   telmisartan  (MICARDIS ) 80 MG tablet Take 80 mg by mouth daily.   No facility-administered encounter medications on file as of 07/26/2023.  Allergies (verified) Patient has no known allergies.   History: Past Medical History:  Diagnosis Date   ABNORMAL EKG 10/07/2008   ADVEF, DRUG/MED/BIOL SUBST, OTHER DRUG NOS 09/26/2006   Agent orange exposure    Allergy    seasonal   Cataract    left eye-surgery 07-2015   DEGENERATIVE JOINT DISEASE, MODERATE 04/13/2007   DIVERTICULOSIS, COLON 04/13/2007   ELEVATED PROSTATE SPECIFIC ANTIGEN 04/14/2007   GERD (gastroesophageal reflux disease)    Glaucoma    sees Dr. Candi Chafe    History of kidney stones    1970    HYPERLIPIDEMIA  09/26/2006   HYPERTENSION 04/13/2007   JOINT EFFUSION, KNEE 06/15/2009   Melanoma (HCC)    sees Dr. Denman Fischer    Numbness and tingling    Pre-diabetes    PROSTATE CANCER 10/05/2009   sees Dr. Inga Manges   Renal insufficiency    stage III  AFB Kidney    Past Surgical History:  Procedure Laterality Date   ACROMIOPLASTY Right 05-06-14   per Dr. Alyson Jump     bilateral   CATARACT EXTRACTION Left    per Dr. Candi Chafe    COLONOSCOPY  07/10/2015   per Dr. Howard Macho, benign polyp, repeat in 10 yrs    CORONARY STENT INTERVENTION N/A 01/17/2018   Procedure: CORONARY STENT INTERVENTION;  Surgeon: Arleen Lacer, MD;  Location: [facility] INVASIVE CV LAB;  Service: Cardiovascular;  Laterality: N/A;   CYSTECTOMY     benign from hand   GLAUCOMA SURGERY Bilateral 2014    heart stent     INSERTION OF MESH N/A 01/10/2013   Procedure: INSERTION OF MESH;  Surgeon: Keitha Pata, MD;  Location: [facility] OR;  Service: General;  Laterality: N/A;   KNEE ARTHROSCOPY Right Jan. 2013   right knee, per Dr. Brunilda Capra    KNEE ARTHROSCOPY Left 01-14-15   per Dr. Brunilda Capra    LEFT HEART CATH AND CORONARY ANGIOGRAPHY N/A 01/17/2018   Procedure: LEFT HEART CATH AND CORONARY ANGIOGRAPHY;  Surgeon: Arleen Lacer, MD;  Location: [facility] INVASIVE CV LAB;  Service: Cardiovascular;  Laterality: N/A;   LEFT HEART CATH AND CORONARY ANGIOGRAPHY N/A 04/06/2018   Procedure: LEFT HEART CATH AND CORONARY ANGIOGRAPHY;  Surgeon: Arty Binning, MD;  Location: [facility] INVASIVE CV LAB;  Service: Cardiovascular;  Laterality: N/A;   MELANOMA EXCISION  2012   per Dr. Denman Fischer   PROSTATECTOMY     per Dr. Inga Manges   TONSILLECTOMY     TOTAL KNEE ARTHROPLASTY Right 07/22/2019   Procedure: TOTAL KNEE ARTHROPLASTY;  Surgeon: Winston Hawking, MD;  Location: [facility] ORS;  Service: Orthopedics;  Laterality: Right;   TOTAL KNEE ARTHROPLASTY Left 07/10/2020   Procedure: TOTAL KNEE ARTHROPLASTY;  Surgeon: Winston Hawking, MD;  Location: [facility] ORS;  Service: Orthopedics;   Laterality: Left;   UMBILICAL HERNIA REPAIR  11/01/2011   Procedure: HERNIA REPAIR UMBILICAL ADULT;  Surgeon: Keitha Pata, MD;  Location: [facility];  Service: General;  Laterality: N/A;  Repair umbilical hernia with mesh patch   VASECTOMY     VENTRAL HERNIA REPAIR  01/10/2013   with mesh     Dr Treasa Friend HERNIA REPAIR N/A 01/10/2013   Procedure: HERNIA REPAIR VENTRAL ADULT ;  Surgeon: Keitha Pata, MD;  Location: [facility] OR;  Service: General;  Laterality: N/A;   Family History  Problem Relation Age of Onset   Stroke Mother    Diabetes Mother    Heart failure Father  Prostate cancer Father    Colon cancer Neg Hx    Colon polyps Neg Hx    Esophageal cancer Neg Hx    Rectal cancer Neg Hx    Stomach cancer Neg Hx    Social History   Socioeconomic History   Marital status: Married    Spouse name: Ronny Colas   Number of children: 2   Years of education: 12   Highest education level: 12th grade  Occupational History   Occupation: Physiological scientist    Comment: retired  Tobacco Use   Smoking status: Former    Current packs/day: 0.00    Average packs/day: 1 pack/day for 9.0 years (9.0 ttl pk-yrs)    Types: Cigarettes    Start date: 03/08/1963    Quit date: 03/07/1972    Years since quitting: 51.4   Smokeless tobacco: Never   Tobacco comments:    States AAA completed   Vaping Use   Vaping status: Never Used  Substance and Sexual Activity   Alcohol use: Yes    Comment: occasional   Drug use: Never   Sexual activity: Not on file  Other Topics Concern   Not on file  Social History Narrative   Was in the [employer]; submarine and Cabin crew   Currently can go to the [employer] orange exposure   Right-handed.   2-3 cups caffeine daily,      Wife; married 40 years   2 children; grands no       05/11/2018:    Lives with wife on one level home   Enjoys going swimming at gym 3 days/week; currently enjoying cardiac rehab   Has one son, one daughter, and one  granddaughter who all live in [place]. Travels to see them every other week.      Social Drivers of Corporate investment banker Strain: Low Risk  (07/26/2023)   Overall Financial Resource Strain (CARDIA)    Difficulty of Paying Living Expenses: Not hard at all  Food Insecurity: No Food Insecurity (07/26/2023)   Hunger Vital Sign    Worried About Running Out of Food in the Last Year: Never true    Ran Out of Food in the Last Year: Never true  Transportation Needs: No Transportation Needs (07/26/2023)   PRAPARE - Administrator, Civil Service (Medical): No    Lack of Transportation (Non-Medical): No  Physical Activity: Insufficiently Active (07/26/2023)   Exercise Vital Sign    Days of Exercise per Week: 3 days    Minutes of Exercise per Session: 40 min  Stress: No Stress Concern Present (07/26/2023)   [employer] of Occupational Health - Occupational Stress Questionnaire    Feeling of Stress : Not at all  Social Connections: Moderately Isolated (07/26/2023)   Social Connection and Isolation Panel [NHANES]    Frequency of Communication with Friends and Family: More than three times a week    Frequency of Social Gatherings with Friends and Family: More than three times a week    Attends Religious Services: Never    Database administrator or Organizations: No    Attends Engineer, structural: Never    Marital Status: Married    Tobacco Counseling Counseling given: Not Answered Tobacco comments: States AAA completed     Clinical Intake:  Pre-visit preparation completed: Yes  Pain : No/denies pain     BMI - recorded: 28.08 Nutritional Status: BMI 25 -29 Overweight Nutritional Risks: None Diabetes: Yes CBG  done?: No Did pt. bring in CBG monitor from home?: No  Lab Results  Component Value Date   HGBA1C 6.0 05/29/2023   HGBA1C 6.0 11/29/2022   HGBA1C 5.8 09/05/2022     How often do you need to have someone help you when you read instructions,  pamphlets, or other written materials from your doctor or pharmacy?: 1 - Never  Interpreter Needed?: No  Information entered by :: Farris Hong LPN   Activities of Daily Living     07/26/2023    9:34 AM 07/19/2023    9:35 AM  In your present state of health, do you have any difficulty performing the following activities:  Hearing? 1 1  Comment Wears Hearing AIDS   Vision? 0 0  Difficulty concentrating or making decisions? 0 0  Walking or climbing stairs? 0 0  Dressing or bathing? 0 0  Doing errands, shopping? 0 0  Preparing Food and eating ? N N  Using the Toilet? N N  In the past six months, have you accidently leaked urine? Colie Dawes  Comment Wears Pads. Followed by medical attention   Do you have problems with loss of bowel control? N N  Managing your Medications? N N  Managing your Finances? N N  Housekeeping or managing your Housekeeping? N N    Patient Care Team: Donley Furth, MD as PCP - General Loyde Rule, MD as PCP - Cardiology (Cardiology) [name], MD as Attending Physician (Urology) [facility] Janel Medford, MD (Inactive) as Attending Physician (Gastroenterology) Eldon Greenland, MD as Consulting Physician (Otolaryngology) Alver Austin, [facility] (Inactive) as Pharmacist (Pharmacist) Katheleen Palmer, RN as Oncology Nurse Navigator  Indicate any recent Medical Services you may have received from other than [facility] providers in the past year (date may be approximate).     Assessment:    This is a routine wellness examination for [name] [name].  Hearing/Vision screen Hearing Screening - Comments:: Wears Hearing Aids Vision Screening - Comments:: Wears rx glasses - up to date with routine eye exams with  Dr Candi Chafe   Goals Addressed               This Visit's Progress     Increase physical activity (pt-stated)        Remain Active.       Depression Screen     07/26/2023    9:31 AM 06/19/2023   10:17 AM 11/29/2022    9:54 AM  07/18/2022    9:48 AM 05/20/2022   11:02 AM 11/19/2021    9:03 AM 07/15/2021   10:31 AM  PHQ 2/9 Scores  PHQ - 2 Score 0 0 0 0 0 0 0  PHQ- 9 Score   0 0 0 0 0    Fall Risk     07/26/2023    9:34 AM 07/19/2023    9:35 AM 05/25/2023    8:55 AM 11/29/2022    9:53 AM 07/18/2022    9:51 AM  Fall Risk   Falls in the past year? 0 0 0 0 0  Number falls in past yr: 0 0  0 0  Injury with Fall? 0 0  0 0  Risk for fall due to : No Fall Risks   No Fall Risks No Fall Risks  Follow up Falls evaluation completed   Falls evaluation completed Falls prevention discussed    MEDICARE RISK AT HOME:  Medicare Risk at Home Any stairs in or around the home?:  Yes If so, are there any without handrails?: No Home free of loose throw rugs in walkways, pet beds, electrical cords, etc?: Yes Adequate lighting in your home to reduce risk of falls?: Yes Life alert?: No Use of a cane, walker or w/c?: No Grab bars in the bathroom?: No Shower chair or bench in shower?: No Elevated toilet seat or a handicapped toilet?: Yes  TIMED UP AND GO:  Was the test performed?  No  Cognitive Function: 6CIT completed    05/09/2017    4:19 PM 05/05/2016   11:50 AM  MMSE - Mini Mental State Exam  Not completed: -- --        07/26/2023    9:36 AM 07/18/2022    9:53 AM 07/15/2021   10:37 AM  6CIT Screen  What Year? 0 points 0 points 0 points  What month? 0 points 0 points 0 points  What time? 0 points 0 points 0 points  Count back from 20 0 points 0 points 0 points  Months in reverse 0 points 0 points 0 points  Repeat phrase 0 points 0 points 0 points  Total Score 0 points 0 points 0 points    Immunizations Immunization History  Administered Date(s) Administered   Fluad Quad(high Dose 65+) 11/13/2018, 11/14/2019, 11/16/2020, 11/19/2021   Fluad Trivalent(High Dose 65+) 11/29/2022   Influenza Split 04/14/2011   Influenza Whole 02/18/2009   Influenza, High Dose Seasonal PF 01/01/2013, 11/06/2014, 11/06/2015,  11/08/2016, 11/07/2017   Influenza,inj,Quad PF,6+ Mos 10/29/2013   Moderna Covid-19 Vaccine  Bivalent Booster 9yrs & up 12/06/2021   Moderna Sars-Covid-2 Vaccination 04/05/2019, 05/03/2019   Pneumococcal Conjugate-13 06/26/2013   Pneumococcal Polysaccharide-23 05/05/2015   Rsv, Bivalent, Protein Subunit Rsvpref,pf (Abrysvo) 12/06/2021   Td 05/10/2006   Tdap 02/08/2018   Zoster Recombinant(Shingrix) 08/27/2019, 10/30/2019   Zoster, Live 06/13/2011    Screening Tests Health Maintenance  Topic Date Due   Diabetic kidney evaluation - Urine ACR  Never done   COVID-19 Vaccine (4 - 2024-25 season) 11/06/2022   INFLUENZA VACCINE  10/06/2023   Diabetic kidney evaluation - eGFR measurement  11/29/2023   HEMOGLOBIN A1C  11/29/2023   OPHTHALMOLOGY EXAM  02/21/2024   Medicare Annual Wellness (AWV)  07/25/2024   Colonoscopy  07/19/2025   DTaP/Tdap/Td (3 - Td or Tdap) 02/09/2028   Pneumonia Vaccine 75+ Years old  Completed   Zoster Vaccines- Shingrix  Completed   HPV VACCINES  Aged Out   Meningococcal B Vaccine  Aged Out   FOOT EXAM  Discontinued    Health Maintenance  Health Maintenance Due  Topic Date Due   Diabetic kidney evaluation - Urine ACR  Never done   COVID-19 Vaccine (4 - 2024-25 season) 11/06/2022   Health Maintenance Items Addressed: Diabetic kidney evaluation- Urine ACR  deferred  Additional Screening:  Vision Screening: Recommended annual ophthalmology exams for early detection of glaucoma and other disorders of the eye.  Dental Screening: Recommended annual dental exams for proper oral hygiene  Community Resource Referral / Chronic Care Management: CRR required this visit?  No   CCM required this visit?  No   Plan:    I have personally reviewed and noted the following in the patient's chart:   Medical and social history Use of alcohol, tobacco or illicit drugs  Current medications and supplements including opioid prescriptions. Patient is not currently  taking opioid prescriptions. Functional ability and status Nutritional status Physical activity Advanced directives List of other physicians Hospitalizations, surgeries, and ER visits in previous  12 months Vitals Screenings to include cognitive, depression, and falls Referrals and appointments  In addition, I have reviewed and discussed with patient certain preventive protocols, quality metrics, and best practice recommendations. A written personalized care plan for preventive services as well as general preventive health recommendations were provided to patient.   Dewayne Ford, LPN   1/61/0960   After Visit Summary: (MyChart) Due to this being a telephonic visit, the after visit summary with patients personalized plan was offered to patient via MyChart   Notes: Nothing significant to report at this time.

## 2023-08-09 NOTE — Progress Notes (Signed)
  Radiation Oncology         [phone]) [phone] ________________________________  Name: [name] [name]. MRN: [id number]  Date of Service: 08/15/2023  DOB: 1946-02-23  Post Treatment Telephone Note  Diagnosis:  C61 Malignant neoplasm of prostate (as documented in provider EOT note)  Pre Treatment IPSS Score: 8 (as documented in the provider consult note)  The patient was available for call today.   Symptoms of fatigue have improved since completing therapy.  Symptoms of bladder changes have improved since completing therapy. Current symptoms include polyuria, and medications for bladder symptoms include none.  Symptoms of bowel changes have improved since completing therapy. Current symptoms include softer stools, and medications for bowel symptoms include none.   Post Treatment IPSS Score: IPSS Questionnaire (AUA-7): Over the past month.   1)  How often have you had a sensation of not emptying your bladder completely after you finish urinating?  3 - About half the time  2)  How often have you had to urinate again less than two hours after you finished urinating? 3 - About half the time  3)  How often have you found you stopped and started again several times when you urinated?  3 - About half the time  4) How difficult have you found it to postpone urination?  0 - Not at all  5) How often have you had a weak urinary stream?  1 - Less than 1 time in 5  6) How often have you had to push or strain to begin urination?  0 - Not at all  7) How many times did you most typically get up to urinate from the time you went to bed until the time you got up in the morning?  1 - 1 time  Total score:  11. Which indicates moderate symptoms  0-7 mildly symptomatic   8-19 moderately symptomatic   20-35 severely symptomatic   Patient has a scheduled follow up visit with his urologist, Dr. Inga Manges, on 08/25/2023 for ongoing surveillance. He was counseled that PSA levels will be drawn in the urology office,  and was reassured that additional time is expected to improve bowel and bladder symptoms. He was encouraged to call back with concerns or questions regarding radiation.   This concludes the interaction.  Avery Bodo, LPN

## 2023-08-10 ENCOUNTER — Other Ambulatory Visit: Payer: Self-pay | Admitting: Urology

## 2023-08-10 DIAGNOSIS — C61 Malignant neoplasm of prostate: Secondary | ICD-10-CM

## 2023-08-10 NOTE — Addendum Note (Signed)
 Encounter addended by: Brynnleigh Mcelwee, PA-C on: 08/10/2023 3:50 PM  Actions taken: Clinical Note Signed, Visit diagnoses modified

## 2023-08-15 ENCOUNTER — Ambulatory Visit
Admission: RE | Admit: 2023-08-15 | Discharge: 2023-08-15 | Disposition: A | Discharge status: Home / Self Care | Source: Ambulatory Visit | Attending: Radiation Oncology | Admitting: Radiation Oncology

## 2023-08-15 DIAGNOSIS — C61 Malignant neoplasm of prostate: Secondary | ICD-10-CM | POA: Insufficient documentation

## 2023-08-15 DIAGNOSIS — Z510 Encounter for antineoplastic radiation therapy: Secondary | ICD-10-CM | POA: Insufficient documentation

## 2023-08-22 ENCOUNTER — Encounter: Payer: Self-pay | Admitting: *Deleted

## 2023-08-23 DIAGNOSIS — H0102B Squamous blepharitis left eye, upper and lower eyelids: Secondary | ICD-10-CM | POA: Diagnosis not present

## 2023-08-23 DIAGNOSIS — Z961 Presence of intraocular lens: Secondary | ICD-10-CM | POA: Diagnosis not present

## 2023-08-23 DIAGNOSIS — H401131 Primary open-angle glaucoma, bilateral, mild stage: Secondary | ICD-10-CM | POA: Diagnosis not present

## 2023-08-23 DIAGNOSIS — H43813 Vitreous degeneration, bilateral: Secondary | ICD-10-CM | POA: Diagnosis not present

## 2023-08-23 DIAGNOSIS — H0102A Squamous blepharitis right eye, upper and lower eyelids: Secondary | ICD-10-CM | POA: Diagnosis not present

## 2023-08-25 DIAGNOSIS — C61 Malignant neoplasm of prostate: Secondary | ICD-10-CM | POA: Diagnosis not present

## 2023-09-01 DIAGNOSIS — N393 Stress incontinence (female) (male): Secondary | ICD-10-CM | POA: Diagnosis not present

## 2023-09-01 DIAGNOSIS — C61 Malignant neoplasm of prostate: Secondary | ICD-10-CM | POA: Diagnosis not present

## 2023-10-23 ENCOUNTER — Inpatient Hospital Stay: Attending: Adult Health | Admitting: *Deleted

## 2023-10-23 ENCOUNTER — Encounter: Payer: Self-pay | Admitting: *Deleted

## 2023-10-23 DIAGNOSIS — N281 Cyst of kidney, acquired: Secondary | ICD-10-CM | POA: Diagnosis not present

## 2023-10-23 DIAGNOSIS — N1832 Chronic kidney disease, stage 3b: Secondary | ICD-10-CM | POA: Diagnosis not present

## 2023-10-23 DIAGNOSIS — E785 Hyperlipidemia, unspecified: Secondary | ICD-10-CM | POA: Diagnosis not present

## 2023-10-23 DIAGNOSIS — E872 Acidosis, unspecified: Secondary | ICD-10-CM | POA: Diagnosis not present

## 2023-10-23 DIAGNOSIS — I129 Hypertensive chronic kidney disease with stage 1 through stage 4 chronic kidney disease, or unspecified chronic kidney disease: Secondary | ICD-10-CM | POA: Diagnosis not present

## 2023-10-23 DIAGNOSIS — C61 Malignant neoplasm of prostate: Secondary | ICD-10-CM

## 2023-10-23 DIAGNOSIS — I2510 Atherosclerotic heart disease of native coronary artery without angina pectoris: Secondary | ICD-10-CM | POA: Diagnosis not present

## 2023-10-23 DIAGNOSIS — N200 Calculus of kidney: Secondary | ICD-10-CM | POA: Diagnosis not present

## 2023-10-23 DIAGNOSIS — M179 Osteoarthritis of knee, unspecified: Secondary | ICD-10-CM | POA: Diagnosis not present

## 2023-10-23 DIAGNOSIS — N179 Acute kidney failure, unspecified: Secondary | ICD-10-CM | POA: Diagnosis not present

## 2023-10-23 NOTE — Progress Notes (Signed)
 SCP reviewed and completed. NKDA. Meds reviewed and updated. Vaccines are updated Pt denies pain today. Pt denies smoking and drinks occasionally. Pt says he only gets up to bathroom at night once or twice after bedtime.No issues with bowels. Pt engages in swimming 3x a week and walks on treadmill the opposite days for 30 minutes. Nutrition was discussed and shared with patient about resources we offer here at cancer center. Last colonoscopy was 07/2015. Next due 07/2025.Pt does have a hx of melanoma and sees dermatologist annually. Most recent PSA was 0.79 at Dr. Wrenn on 08/25/2023. A copy of summary has been sent to PCP.

## 2023-10-24 DIAGNOSIS — K432 Incisional hernia without obstruction or gangrene: Secondary | ICD-10-CM | POA: Diagnosis not present

## 2023-11-27 DIAGNOSIS — C61 Malignant neoplasm of prostate: Secondary | ICD-10-CM | POA: Diagnosis not present

## 2023-11-30 ENCOUNTER — Ambulatory Visit: Admitting: Family Medicine

## 2023-11-30 ENCOUNTER — Encounter: Payer: Self-pay | Admitting: Family Medicine

## 2023-11-30 VITALS — BP 128/60 | HR 49 | Temp 98.1°F | Ht 66.5 in | Wt 190.0 lb

## 2023-11-30 DIAGNOSIS — C61 Malignant neoplasm of prostate: Secondary | ICD-10-CM

## 2023-11-30 DIAGNOSIS — N1831 Chronic kidney disease, stage 3a: Secondary | ICD-10-CM

## 2023-11-30 DIAGNOSIS — Z23 Encounter for immunization: Secondary | ICD-10-CM

## 2023-11-30 DIAGNOSIS — E785 Hyperlipidemia, unspecified: Secondary | ICD-10-CM

## 2023-11-30 DIAGNOSIS — I2510 Atherosclerotic heart disease of native coronary artery without angina pectoris: Secondary | ICD-10-CM | POA: Diagnosis not present

## 2023-11-30 DIAGNOSIS — M150 Primary generalized (osteo)arthritis: Secondary | ICD-10-CM

## 2023-11-30 DIAGNOSIS — E1122 Type 2 diabetes mellitus with diabetic chronic kidney disease: Secondary | ICD-10-CM | POA: Diagnosis not present

## 2023-11-30 DIAGNOSIS — I10 Essential (primary) hypertension: Secondary | ICD-10-CM

## 2023-11-30 LAB — BASIC METABOLIC PANEL WITH GFR
BUN: 23 mg/dL (ref 6–23)
CO2: 23 meq/L (ref 19–32)
Calcium: 9.3 mg/dL (ref 8.4–10.5)
Chloride: 108 meq/L (ref 96–112)
Creatinine, Ser: 1.54 mg/dL — ABNORMAL HIGH (ref 0.40–1.50)
GFR: 43.67 mL/min — ABNORMAL LOW (ref >60.00)
Glucose, Bld: 100 mg/dL — ABNORMAL HIGH (ref 70–99)
Potassium: 4.1 meq/L (ref 3.5–5.1)
Sodium: 141 meq/L (ref 135–145)

## 2023-11-30 LAB — HEPATIC FUNCTION PANEL
ALT: 16 U/L (ref 0–53)
AST: 21 U/L (ref 0–37)
Albumin: 4.5 g/dL (ref 3.5–5.2)
Alkaline Phosphatase: 32 U/L — ABNORMAL LOW (ref 39–117)
Bilirubin, Direct: 0.2 mg/dL (ref 0.0–0.3)
Total Bilirubin: 0.7 mg/dL (ref 0.2–1.2)
Total Protein: 6.9 g/dL (ref 6.0–8.3)

## 2023-11-30 LAB — CBC WITH DIFFERENTIAL/PLATELET
Basophils Absolute: 0.1 K/uL (ref 0.0–0.1)
Basophils Relative: 1.3 % (ref 0.0–3.0)
Eosinophils Absolute: 0.1 K/uL (ref 0.0–0.7)
Eosinophils Relative: 1.1 % (ref 0.0–5.0)
HCT: 46.2 % (ref 39.0–52.0)
Hemoglobin: 15.1 g/dL (ref 13.0–17.0)
Lymphocytes Relative: 24.5 % (ref 12.0–46.0)
Lymphs Abs: 1.8 K/uL (ref 0.7–4.0)
MCHC: 32.6 g/dL (ref 30.0–36.0)
MCV: 90.6 fl (ref 78.0–100.0)
Monocytes Absolute: 0.8 K/uL (ref 0.1–1.0)
Monocytes Relative: 10.5 % (ref 3.0–12.0)
Neutro Abs: 4.5 K/uL (ref 1.4–7.7)
Neutrophils Relative %: 62.6 % (ref 43.0–77.0)
Platelets: 188 K/uL (ref 150.0–400.0)
RBC: 5.1 Mil/uL (ref 4.22–5.81)
RDW: 14.3 % (ref 11.5–15.5)
WBC: 7.2 K/uL (ref 4.0–10.5)

## 2023-11-30 LAB — LIPID PANEL
Cholesterol: 153 mg/dL (ref 0–200)
HDL: 47.7 mg/dL (ref >39.00)
LDL Cholesterol: 87 mg/dL (ref 0–99)
NonHDL: 105.33
Total CHOL/HDL Ratio: 3
Triglycerides: 91 mg/dL (ref 0.0–149.0)
VLDL: 18.2 mg/dL (ref 0.0–40.0)

## 2023-11-30 LAB — TSH: TSH: 2.65 u[IU]/mL (ref 0.35–5.50)

## 2023-11-30 LAB — HEMOGLOBIN A1C: Hgb A1c MFr Bld: 6.5 % (ref 4.6–6.5)

## 2023-11-30 NOTE — Progress Notes (Signed)
 Subjective:    Patient ID: [name] Sur., male    DOB: 05-Mar-1946, 78 y.o.   MRN: [id number]  HPI Here to follow up on issues. He is doing quite well all in all. He sees Dr. Brunetta for his recurrent prostate cancer. He was found to have 2 small spots of cancer below the bladder and his PSA had jumped to above 5. This past summer he had 5 sessions of directed radiation therapy, and his PSA last wee was down to 0.6. He sees Dr. Rayburn for his CKD, and this has been stable. He sees Dr. Nishan for his CAD, and this has been stable. He recently saw Dr. Eletha for his ventral incisional hernia, and everyone agrees he does not need a surgery for this. He still swims or walks on a treadmill almost every day.    Review of Systems  Constitutional: Negative.   HENT: Negative.    Eyes: Negative.   Respiratory: Negative.    Cardiovascular: Negative.   Gastrointestinal: Negative.   Genitourinary: Negative.   Musculoskeletal: Negative.   Skin: Negative.   Neurological: Negative.   Psychiatric/Behavioral: Negative.         Objective:   Physical Exam Constitutional:      General: He is not in acute distress.    Appearance: Normal appearance. He is well-developed. He is not diaphoretic.  HENT:     Head: Normocephalic and atraumatic.     Right Ear: External ear normal.     Left Ear: External ear normal.     Nose: Nose normal.     Mouth/Throat:     Pharynx: No oropharyngeal exudate.  Eyes:     General: No scleral icterus.       Right eye: No discharge.        Left eye: No discharge.     Conjunctiva/sclera: Conjunctivae normal.     Pupils: Pupils are equal, round, and reactive to light.  Neck:     Thyroid : No thyromegaly.     Vascular: No JVD.     Trachea: No tracheal deviation.  Cardiovascular:     Rate and Rhythm: Normal rate and regular rhythm.     Pulses: Normal pulses.     Heart sounds: Normal heart sounds. No murmur heard.    No friction rub. No gallop.  Pulmonary:      Effort: Pulmonary effort is normal. No respiratory distress.     Breath sounds: Normal breath sounds. No wheezing or rales.  Chest:     Chest wall: No tenderness.  Abdominal:     General: Bowel sounds are normal. There is no distension.     Palpations: Abdomen is soft. There is no mass.     Tenderness: There is no abdominal tenderness. There is no guarding or rebound.  Genitourinary:    Penis: No tenderness.   Musculoskeletal:        General: No tenderness. Normal range of motion.     Cervical back: Neck supple.  Lymphadenopathy:     Cervical: No cervical adenopathy.  Skin:    General: Skin is warm and dry.     Coloration: Skin is not pale.     Findings: No erythema or rash.  Neurological:     General: No focal deficit present.     Mental Status: He is alert and oriented to person, place, and time.     Cranial Nerves: No cranial nerve deficit.     Motor: No abnormal muscle tone.  Coordination: Coordination normal.     Deep Tendon Reflexes: Reflexes are normal and symmetric. Reflexes normal.  Psychiatric:        Mood and Affect: Mood normal.        Behavior: Behavior normal.        Thought Content: Thought content normal.        Judgment: Judgment normal.           Assessment & Plan:  His HTN and CAD are stable. His stage 3a CKD is stable. His type 2 diabetes is stable. He will follow up with Urology for the prostate cancer. His OA is stable. We will get labs to check lipids, etc. I personally spent a total of 33 minutes in the care of the patient today including getting/reviewing separately obtained history, performing a medically appropriate exam/evaluation, and coordinating care.  Garnette Olmsted, MD

## 2023-11-30 NOTE — Addendum Note (Signed)
 Addended by: LADONNA INOCENTE SAILOR on: 11/30/2023 01:47 PM   Modules accepted: Orders

## 2023-12-01 ENCOUNTER — Ambulatory Visit: Payer: Self-pay | Admitting: Family Medicine

## 2023-12-04 DIAGNOSIS — C61 Malignant neoplasm of prostate: Secondary | ICD-10-CM | POA: Diagnosis not present

## 2023-12-13 DIAGNOSIS — Z23 Encounter for immunization: Secondary | ICD-10-CM | POA: Diagnosis not present

## 2024-02-12 ENCOUNTER — Encounter: Payer: Self-pay | Admitting: Family Medicine

## 2024-02-12 ENCOUNTER — Ambulatory Visit: Payer: Self-pay

## 2024-02-12 ENCOUNTER — Ambulatory Visit

## 2024-02-12 ENCOUNTER — Ambulatory Visit: Admitting: Family Medicine

## 2024-02-12 VITALS — BP 136/80 | HR 56 | Temp 98.2°F | Ht 66.5 in | Wt 191.2 lb

## 2024-02-12 DIAGNOSIS — J189 Pneumonia, unspecified organism: Secondary | ICD-10-CM

## 2024-02-12 DIAGNOSIS — R350 Frequency of micturition: Secondary | ICD-10-CM

## 2024-02-12 DIAGNOSIS — N390 Urinary tract infection, site not specified: Secondary | ICD-10-CM | POA: Diagnosis not present

## 2024-02-12 DIAGNOSIS — I10 Essential (primary) hypertension: Secondary | ICD-10-CM | POA: Diagnosis not present

## 2024-02-12 DIAGNOSIS — R0989 Other specified symptoms and signs involving the circulatory and respiratory systems: Secondary | ICD-10-CM | POA: Diagnosis not present

## 2024-02-12 DIAGNOSIS — R059 Cough, unspecified: Secondary | ICD-10-CM | POA: Diagnosis not present

## 2024-02-12 LAB — POCT URINALYSIS DIPSTICK
Bilirubin, UA: NEGATIVE
Blood, UA: NEGATIVE
Glucose, UA: NEGATIVE
Ketones, UA: NEGATIVE
Leukocytes, UA: NEGATIVE
Nitrite, UA: NEGATIVE
Protein, UA: NEGATIVE
Spec Grav, UA: 1.02 (ref 1.010–1.025)
Urobilinogen, UA: 0.2 U/dL
pH, UA: 6 (ref 5.0–8.0)

## 2024-02-12 MED ORDER — LEVOFLOXACIN 500 MG PO TABS: 500.0000 mg | ORAL_TABLET | Freq: Every day | ORAL | 0 refills | Status: AC

## 2024-02-12 MED ORDER — CEFTRIAXONE SODIUM 1 G IJ SOLR
1.0000 g | Freq: Once | INTRAMUSCULAR | Status: AC
  Administered 2024-02-12: 1 g via INTRAMUSCULAR

## 2024-02-12 NOTE — Telephone Encounter (Signed)
 Per chart review, patient already checked in at office for visit with provider. No nurse triage needs at this time.   Copied from CRM [id number]. Topic: Clinical - Red Word Triage >> Feb 12, 2024  9:08 AM [place] MATSU wrote: Red Word that prompted transfer to Nurse Triage: left side chest pain per my chart message MD office ask patient to speak to triage. Reason for Disposition . Patient already left for the hospital/clinic.  Protocols used: No Contact or Duplicate Contact Call-A-AH

## 2024-02-12 NOTE — Progress Notes (Signed)
   Subjective:    Patient ID: [name] [name]., male    DOB: 19-Dec-1945, 78 y.o.   MRN: [id number]  HPI Here for 2 issues. First he has had UTI symptoms for about 4 weeks with urinary pressure and frequency. No fever or back pain. He was at the [facility] 3 weeks ago, and he was given 5 days of Augmentin. He felt better for a week or so, but then the urinary symptoms returned. Also about one week ago he developed a dry cough and a dull pressure in the left chest area when he coughs. No SOB.    Review of Systems  Constitutional: Negative.   Respiratory:  Positive for cough. Negative for shortness of breath and wheezing.   Cardiovascular:  Positive for chest pain. Negative for palpitations and leg swelling.  Genitourinary:  Positive for frequency and urgency. Negative for dysuria, flank pain and hematuria.       Objective:   Physical Exam Constitutional:      Appearance: Normal appearance.  Cardiovascular:     Rate and Rhythm: Normal rate and regular rhythm.     Pulses: Normal pulses.     Heart sounds: Normal heart sounds.  Pulmonary:     Effort: Pulmonary effort is normal.     Breath sounds: Rales present. No wheezing or rhonchi.     Comments: There are rales in the left posterior base  Abdominal:     Tenderness: There is no right CVA tenderness or left CVA tenderness.  Neurological:     Mental Status: He is alert.           Assessment & Plan:  He has a partially treated UTI and he has a LLL pneumonia. We will culture the urine sample. He is given a shot of Rocephin , as well as 10 days of Levaquin  500 mg daily. His HTN is stable.  Garnette Olmsted, MD

## 2024-02-12 NOTE — Telephone Encounter (Signed)
 Pt was seen today.

## 2024-02-12 NOTE — Addendum Note (Signed)
 Addended by: Latima Hamza on: 02/12/2024 04:42 PM   Modules accepted: Orders

## 2024-02-13 ENCOUNTER — Ambulatory Visit: Admitting: Family Medicine

## 2024-02-13 LAB — URINE CULTURE
MICRO NUMBER:: 17326464
Result:: NO GROWTH
SPECIMEN QUALITY:: ADEQUATE

## 2024-02-14 ENCOUNTER — Ambulatory Visit: Payer: Self-pay | Admitting: Family Medicine

## 2024-02-19 DIAGNOSIS — C61 Malignant neoplasm of prostate: Secondary | ICD-10-CM | POA: Diagnosis not present

## 2024-05-28 ENCOUNTER — Ambulatory Visit: Admitting: Cardiovascular Disease

## 2024-05-29 ENCOUNTER — Ambulatory Visit: Admitting: Family Medicine

## 2024-08-02 ENCOUNTER — Ambulatory Visit
